# Patient Record
Sex: Female | Born: 1950 | Race: White | Hispanic: No
Health system: Midwestern US, Academic
[De-identification: ages and names within clinical notes are randomized; demographics above are authoritative.]

## PROBLEM LIST (undated history)

## (undated) DIAGNOSIS — E039 Hypothyroidism, unspecified: Secondary | ICD-10-CM

## (undated) DIAGNOSIS — D649 Anemia, unspecified: Secondary | ICD-10-CM

## (undated) DIAGNOSIS — Z8262 Family history of osteoporosis: Secondary | ICD-10-CM

## (undated) DIAGNOSIS — H269 Unspecified cataract: Secondary | ICD-10-CM

## (undated) DIAGNOSIS — M1990 Unspecified osteoarthritis, unspecified site: Secondary | ICD-10-CM

## (undated) DIAGNOSIS — R55 Syncope and collapse: Secondary | ICD-10-CM

## (undated) DIAGNOSIS — J309 Allergic rhinitis, unspecified: Secondary | ICD-10-CM

## (undated) DIAGNOSIS — Z9889 Other specified postprocedural states: Secondary | ICD-10-CM

## (undated) DIAGNOSIS — G5600 Carpal tunnel syndrome, unspecified upper limb: Secondary | ICD-10-CM

## (undated) DIAGNOSIS — R0982 Postnasal drip: Secondary | ICD-10-CM

## (undated) DIAGNOSIS — N3090 Cystitis, unspecified without hematuria: Secondary | ICD-10-CM

## (undated) DIAGNOSIS — C801 Malignant (primary) neoplasm, unspecified: Secondary | ICD-10-CM

## (undated) DIAGNOSIS — R112 Nausea with vomiting, unspecified: Secondary | ICD-10-CM

## (undated) HISTORY — DX: Allergic rhinitis, unspecified: J30.9

## (undated) HISTORY — PX: DIAGNOSTIC LAPAROSCOPY: SUR761

## (undated) HISTORY — DX: Family history of osteoporosis: Z82.62

## (undated) HISTORY — PX: EYE SURGERY: SHX253

## (undated) HISTORY — PX: CARPAL TUNNEL RELEASE: SHX101

## (undated) HISTORY — PX: BONE MARROW BIOPSY: SHX199

## (undated) HISTORY — PX: BACK SURGERY: SHX140

## (undated) HISTORY — DX: Cystitis, unspecified without hematuria: N30.90

## (undated) HISTORY — DX: Postnasal drip: R09.82

## (undated) HISTORY — DX: Anemia, unspecified: D64.9

## (undated) HISTORY — DX: Carpal tunnel syndrome, unspecified upper limb: G56.00

## (undated) HISTORY — DX: Syncope and collapse: R55

---
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
## 2000-05-19 ENCOUNTER — Other Ambulatory Visit: Admission: RE | Admit: 2000-05-19 | Discharge: 2000-05-19 | Payer: Self-pay | Admitting: Family Medicine

## 2002-08-09 ENCOUNTER — Ambulatory Visit (HOSPITAL_BASED_OUTPATIENT_CLINIC_OR_DEPARTMENT_OTHER): Admission: RE | Admit: 2002-08-09 | Discharge: 2002-08-09 | Payer: Self-pay | Admitting: Orthopedic Surgery

## 2003-02-04 ENCOUNTER — Other Ambulatory Visit: Admission: RE | Admit: 2003-02-04 | Discharge: 2003-02-04 | Payer: Self-pay | Admitting: Family Medicine

## 2003-09-28 HISTORY — PX: COLONOSCOPY: SHX174

## 2004-06-12 ENCOUNTER — Ambulatory Visit (HOSPITAL_COMMUNITY): Admission: RE | Admit: 2004-06-12 | Discharge: 2004-06-12 | Payer: Self-pay | Admitting: Gastroenterology

## 2004-12-10 ENCOUNTER — Ambulatory Visit: Payer: Self-pay | Admitting: Family Medicine

## 2005-02-05 ENCOUNTER — Ambulatory Visit: Payer: Self-pay | Admitting: Internal Medicine

## 2005-05-10 ENCOUNTER — Ambulatory Visit: Payer: Self-pay | Admitting: Family Medicine

## 2005-06-12 LAB — HM COLONOSCOPY: HM Colonoscopy: NORMAL

## 2006-04-13 ENCOUNTER — Other Ambulatory Visit: Admission: RE | Admit: 2006-04-13 | Discharge: 2006-04-13 | Payer: Self-pay | Admitting: Family Medicine

## 2006-04-13 ENCOUNTER — Ambulatory Visit: Payer: Self-pay | Admitting: Family Medicine

## 2006-04-13 ENCOUNTER — Encounter: Payer: Self-pay | Admitting: Family Medicine

## 2006-05-13 LAB — FECAL OCCULT BLOOD, GUAIAC: Fecal Occult Blood: NEGATIVE

## 2006-05-19 ENCOUNTER — Ambulatory Visit: Payer: Self-pay | Admitting: Family Medicine

## 2006-07-18 ENCOUNTER — Ambulatory Visit: Payer: Self-pay | Admitting: Ophthalmology

## 2007-11-16 ENCOUNTER — Ambulatory Visit: Payer: Self-pay | Admitting: Family Medicine

## 2008-05-21 ENCOUNTER — Telehealth: Payer: Self-pay | Admitting: Family Medicine

## 2008-06-26 ENCOUNTER — Encounter: Payer: Self-pay | Admitting: Family Medicine

## 2008-06-26 ENCOUNTER — Other Ambulatory Visit: Admission: RE | Admit: 2008-06-26 | Discharge: 2008-06-26 | Payer: Self-pay | Admitting: Family Medicine

## 2008-06-26 ENCOUNTER — Ambulatory Visit: Payer: Self-pay | Admitting: Family Medicine

## 2008-06-26 DIAGNOSIS — D649 Anemia, unspecified: Secondary | ICD-10-CM

## 2008-06-26 DIAGNOSIS — T148XXA Other injury of unspecified body region, initial encounter: Secondary | ICD-10-CM | POA: Insufficient documentation

## 2008-06-26 DIAGNOSIS — J309 Allergic rhinitis, unspecified: Secondary | ICD-10-CM | POA: Insufficient documentation

## 2008-06-26 DIAGNOSIS — R55 Syncope and collapse: Secondary | ICD-10-CM | POA: Insufficient documentation

## 2008-07-02 LAB — CONVERTED CEMR LAB
Albumin: 4.3 g/dL (ref 3.5–5.2)
BUN: 14 mg/dL (ref 6–23)
Calcium: 9.3 mg/dL (ref 8.4–10.5)
Cholesterol: 216 mg/dL (ref 0–200)
Direct LDL: 136.9 mg/dL
Eosinophils Relative: 1.9 % (ref 0.0–5.0)
GFR calc Af Amer: 95 mL/min
Glucose, Bld: 82 mg/dL (ref 70–99)
HCT: 39.3 % (ref 36.0–46.0)
Hemoglobin: 13.6 g/dL (ref 12.0–15.0)
MCV: 95.9 fL (ref 78.0–100.0)
Monocytes Absolute: 0.8 10*3/uL (ref 0.1–1.0)
Monocytes Relative: 9.3 % (ref 3.0–12.0)
Neutro Abs: 4.9 10*3/uL (ref 1.4–7.7)
RDW: 13 % (ref 11.5–14.6)
TSH: 2.64 microintl units/mL (ref 0.35–5.50)
Total CHOL/HDL Ratio: 3.4
Total Protein: 8 g/dL (ref 6.0–8.3)
WBC: 8.1 10*3/uL (ref 4.5–10.5)

## 2008-07-16 ENCOUNTER — Encounter: Payer: Self-pay | Admitting: Family Medicine

## 2008-10-28 LAB — HM DEXA SCAN: HM Dexa Scan: NORMAL

## 2008-10-31 ENCOUNTER — Encounter: Payer: Self-pay | Admitting: Family Medicine

## 2008-11-05 ENCOUNTER — Encounter (INDEPENDENT_AMBULATORY_CARE_PROVIDER_SITE_OTHER): Payer: Self-pay | Admitting: *Deleted

## 2009-07-15 ENCOUNTER — Ambulatory Visit: Payer: Self-pay | Admitting: Family Medicine

## 2009-07-22 ENCOUNTER — Telehealth: Payer: Self-pay | Admitting: Family Medicine

## 2009-07-22 ENCOUNTER — Ambulatory Visit: Payer: Self-pay | Admitting: Family Medicine

## 2009-07-22 DIAGNOSIS — N3090 Cystitis, unspecified without hematuria: Secondary | ICD-10-CM | POA: Insufficient documentation

## 2009-07-22 LAB — CONVERTED CEMR LAB
Bilirubin Urine: NEGATIVE
Casts: 0 /lpf
Specific Gravity, Urine: >=1.03
Urobilinogen, UA: 0.2
Yeast, UA: 0
pH: 6

## 2009-07-23 ENCOUNTER — Encounter: Payer: Self-pay | Admitting: Family Medicine

## 2009-09-16 ENCOUNTER — Ambulatory Visit: Payer: Self-pay | Admitting: Family Medicine

## 2009-09-16 DIAGNOSIS — R0982 Postnasal drip: Secondary | ICD-10-CM | POA: Insufficient documentation

## 2009-09-22 LAB — CONVERTED CEMR LAB
BUN: 9 mg/dL (ref 6–23)
Basophils Absolute: 0.2 10*3/uL — ABNORMAL HIGH (ref 0.0–0.1)
Direct LDL: 123 mg/dL
Eosinophils Absolute: 0.2 10*3/uL (ref 0.0–0.7)
GFR calc non Af Amer: 68.04 mL/min (ref >60)
Glucose, Bld: 66 mg/dL — ABNORMAL LOW (ref 70–99)
HCT: 38.2 % (ref 36.0–46.0)
HDL: 71.6 mg/dL (ref >39.00)
Lymphs Abs: 2.3 10*3/uL (ref 0.7–4.0)
MCV: 97.5 fL (ref 78.0–100.0)
Monocytes Absolute: 0.5 10*3/uL (ref 0.1–1.0)
Monocytes Relative: 5.6 % (ref 3.0–12.0)
Platelets: 236 10*3/uL (ref 150.0–400.0)
Potassium: 4.5 meq/L (ref 3.5–5.1)
RDW: 12.8 % (ref 11.5–14.6)
TSH: 2.45 microintl units/mL (ref 0.35–5.50)
Total Bilirubin: 0.7 mg/dL (ref 0.3–1.2)

## 2009-10-16 ENCOUNTER — Encounter: Payer: Self-pay | Admitting: Family Medicine

## 2009-12-05 ENCOUNTER — Ambulatory Visit: Payer: Self-pay | Admitting: Family Medicine

## 2009-12-05 DIAGNOSIS — J209 Acute bronchitis, unspecified: Secondary | ICD-10-CM | POA: Insufficient documentation

## 2010-10-29 NOTE — Assessment & Plan Note (Signed)
Summary: 3:15 CONGESTION/DLO   Vital Signs:  Patient profile:   60 year old female Height:      64.5 inches Weight:      165.50 pounds BMI:     28.07 Temp:     98.4 degrees F oral Pulse rate:   88 / minute Pulse rhythm:   regular BP sitting:   112 / 70  (left arm) Cuff size:   regular  Vitals Entered By: Delilah Shan CMA Duncan Dull) (December 05, 2009 3:32 PM) CC: Congestion   History of Present Illness: 60 yo with 1 1/2 weeks of runny nose, productive cough. Felt a little feverish last night. No short of breath, no chest pain. Ears popping a little, no sinus pressure. Taking Mucinex which helps a little with symptoms.  Current Medications (verified): 1)  Calcium 1200 Mg .... Take By Mouth Daily As Directed 2)  Vitamin D .... Take By Mouth Daily As Directed 3)  Fish Oil   Oil (Fish Oil) .... Take 1 or 2 By Mouth Once Daily 4)  Azithromycin 250 Mg  Tabs (Azithromycin) .... 2 By  Mouth Today and Then 1 Daily For 4 Days 5)  Tussionex Pennkinetic Er 8-10 Mg/9ml Lqcr (Chlorpheniramine-Hydrocodone) .Marland Kitchen.. 1 Tsp Every 12 Hours As Needed Cough  Allergies: 1)  ! Allegra  Review of Systems      See HPI General:  Complains of fever; denies chills. ENT:  Complains of earache, nasal congestion, and sore throat; denies ear discharge and sinus pressure. Resp:  Complains of cough and sputum productive; denies shortness of breath and wheezing.  Physical Exam  General:  Well-developed,well-nourished,in no acute distress; alert,appropriate and cooperative throughout examination Ears:  TMs retracted bilaterally. Nose:  mucosal erythema.   Mouth:  pharynx pink and moist.   Lungs:  Normal respiratory effort, chest expands symmetrically.No crackles, scattered exp wheezes. Heart:  Normal rate and regular rhythm. S1 and S2 normal without gallop, murmur, click, rub or other extra sounds. Extremities:  no edema Psych:  normally interactive and good eye contact.     Impression &  Recommendations:  Problem # 1:  ACUTE BRONCHITIS (ICD-466.0) Assessment New Given duration of symptoms, will great with Zpack. Continue supportive care.  See pt instructions for details. Her updated medication list for this problem includes:    Azithromycin 250 Mg Tabs (Azithromycin) .Marland Kitchen... 2 by  mouth today and then 1 daily for 4 days    Tussionex Pennkinetic Er 8-10 Mg/23ml Lqcr (Chlorpheniramine-hydrocodone) .Marland Kitchen... 1 tsp every 12 hours as needed cough  Complete Medication List: 1)  Calcium 1200 Mg  .... Take by mouth daily as directed 2)  Vitamin D  .... Take by mouth daily as directed 3)  Fish Oil Oil (Fish oil) .... Take 1 or 2 by mouth once daily 4)  Azithromycin 250 Mg Tabs (Azithromycin) .... 2 by  mouth today and then 1 daily for 4 days 5)  Tussionex Pennkinetic Er 8-10 Mg/68ml Lqcr (Chlorpheniramine-hydrocodone) .Marland Kitchen.. 1 tsp every 12 hours as needed cough  Patient Instructions: 1)  Take antibiotic as directed.  Drink lots of fluids.  Treat sympotmatically with Mucinex, nasal saline irrigation, and Tylenol/Ibuprofen. Also try claritin D or zyrtec D over the counter- two times a day as needed ( have to sign for them at pharmacy). You can use warm compresses.  Cough suppressant at night. Call if not improving as expected in 5-7 days.  Prescriptions: TUSSIONEX PENNKINETIC ER 8-10 MG/5ML LQCR (CHLORPHENIRAMINE-HYDROCODONE) 1 tsp every 12 hours as needed cough  #  4 ounces x 0   Entered and Authorized by:   Ruthe Mannan MD   Signed by:   Ruthe Mannan MD on 12/05/2009   Method used:   Print then Give to Patient   RxID:   [id number] AZITHROMYCIN 250 MG  TABS (AZITHROMYCIN) 2 by  mouth today and then 1 daily for 4 days  #6 x 0   Entered and Authorized by:   Ruthe Mannan MD   Signed by:   Ruthe Mannan MD on 12/05/2009   Method used:   Electronically to        [facility]. [address] [phone]* (retail)       [address] [place], [place]  [id number]       Ph: [phone]       Fax:  [phone]   RxID:   [id number]   Current Allergies (reviewed today): ! ALLEGRA

## 2011-02-12 NOTE — Op Note (Signed)
   [name], Carla [name]                               ACCOUNT NO.:  [id number]   MEDICAL RECORD NO.:  [id number]                   PATIENT TYPE:  AMB   LOCATION:  DSC                                  FACILITY:  [facility]   PHYSICIAN:  Cindee Salt, M.D.                    DATE OF BIRTH:  1951-06-11   DATE OF PROCEDURE:  08/09/2002  DATE OF DISCHARGE:                                 OPERATIVE REPORT   PREOPERATIVE DIAGNOSIS:  Carpal tunnel syndrome, right hand.   POSTOPERATIVE DIAGNOSIS:  Carpal tunnel syndrome, right hand.   OPERATION:  Decompression of right median nerve.   SURGEON:  Cindee Salt, M.D.   ASSISTANT:  Alfredo Bach, R.N.   ANESTHESIA:  Forearm-based IV regional.   HISTORY:  The patient is a 60 year old female with a history of carpal  tunnel syndrome -- EMG and nerve conductions positive -- which has not  responded to conservative treatment.   DESCRIPTION OF PROCEDURE:  The patient was brought to the operating room  where a forearm-based IV regional anesthetic was carried out without  difficulty.  She was prepped and draped using Betadine scrubbing solution  with the right arm free, in a supine position.  A longitudinal incision was  made in the palm and carried down through subcutaneous tissue; bleeders were  electrocauterized.  Palmar fascia was split, superficial palmar arch  identified, the flexor tendons to the ring and little fingers identified.  To the ulnar side of the median nerve, the carpal retinaculum was incised  with sharp dissection.  A right-angle and Sewall retractor were placed  between skin and forearm fascia.  The fascia was released for approximately  3 cm proximal to the wrist crease under direct vision.  Canal was explored  and no further lesions were identified.  The wound was irrigated.  The skin  was closed with interrupted 5-0 nylon sutures.  Sterile compressive dressing  and splint were applied.  The patient tolerated the procedure well and was  taken to the recovery room for observation in satisfactory condition.   She is discharged home to return to the [facility] [place] in one  week on Vicodin and Keflex.                                               Cindee Salt, M.D.    GK/MEDQ  D:  08/09/2002  T:  08/09/2002  Job:  [id number]

## 2011-02-12 NOTE — Op Note (Signed)
NAMESHARAH, FINNELL                               ACCOUNT NO.:  [id number]   MEDICAL RECORD NO.:  [id number]                   PATIENT TYPE:  AMB   LOCATION:  ENDO                                 FACILITY:  [facility]   PHYSICIAN:  Bernette Redbird, M.D.                DATE OF BIRTH:  [birth year]   DATE OF PROCEDURE:  06/12/2004  DATE OF DISCHARGE:                                 OPERATIVE REPORT   PROCEDURE:  Colonoscopy.   INDICATION:  Screening in a standard-risk individual without prior screening  procedures.  No worrisome symptoms, no risk factors for colon cancer.   ENDOSCOPIST:  Bernette Redbird, M.D.   FINDINGS:  Normal exam to the terminal ileum.   DESCRIPTION OF PROCEDURE:  The nature, purpose, and risks of the procedure  have been discussed with the patient who provided written consent.  Sedation  was Phenergan 12.5 mg IV to reduce chance of nausea, fentanyl 40 mcg, and  Versed 4 mg IV without arrhythmias or desaturation.  The Olympus adjustable-  tension pediatric videocolonoscope was advanced with moderate difficulty  through a fixated, angulated sigmoid region, and then fairly easily around  the colon to the terminal ileum which had normal appearance, and pullback  was then performed.  The quality of the prep was very good, and it was felt  that all areas were well seen.   We did have to turn the patient into the supine position to facilitate  getting through the sigmoid region.   This was a normal examination.  No polyps, cancer, colitis, vascular  malformations, or diverticular disease were appreciated.  Retroflexion in  the rectum and re-inspection of the rectum were unremarkable.  No biopsies  were obtained.  The patient tolerated the procedure well, and there were no  apparent complications.   IMPRESSION:  1.  Normal screening colonoscopy (V76.51).  2.  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   PLAN:  Flexible sigmoidoscopy in five years for continued  screening.      RB/MEDQ  D:  06/12/2004  T:  06/13/2004  Job:  [id number]   cc:   Marne A. Milinda Antis, M.D. [facility]

## 2011-06-15 ENCOUNTER — Other Ambulatory Visit (INDEPENDENT_AMBULATORY_CARE_PROVIDER_SITE_OTHER): Payer: Self-pay

## 2011-06-15 ENCOUNTER — Other Ambulatory Visit: Payer: Self-pay

## 2011-06-15 ENCOUNTER — Telehealth (INDEPENDENT_AMBULATORY_CARE_PROVIDER_SITE_OTHER): Payer: Self-pay | Admitting: Family Medicine

## 2011-06-15 DIAGNOSIS — Z0000 Encounter for general adult medical examination without abnormal findings: Secondary | ICD-10-CM

## 2011-06-15 DIAGNOSIS — D649 Anemia, unspecified: Secondary | ICD-10-CM

## 2011-06-15 LAB — CBC WITH DIFFERENTIAL/PLATELET
Basophils Absolute: 0 10*3/uL (ref 0.0–0.1)
Basophils Relative: 0.6 % (ref 0.0–3.0)
Eosinophils Absolute: 0.1 10*3/uL (ref 0.0–0.7)
Lymphocytes Relative: 26.3 % (ref 12.0–46.0)
MCHC: 33.7 g/dL (ref 30.0–36.0)
Neutrophils Relative %: 64.7 % (ref 43.0–77.0)
RBC: 3.94 Mil/uL (ref 3.87–5.11)

## 2011-06-15 LAB — COMPREHENSIVE METABOLIC PANEL
ALT: 19 U/L (ref 0–35)
AST: 22 U/L (ref 0–37)
Albumin: 4.1 g/dL (ref 3.5–5.2)
Calcium: 9.4 mg/dL (ref 8.4–10.5)
Chloride: 103 mEq/L (ref 96–112)
Potassium: 4.2 mEq/L (ref 3.5–5.1)

## 2011-06-15 LAB — LIPID PANEL
LDL Cholesterol: 109 mg/dL — ABNORMAL HIGH (ref 0–99)
Total CHOL/HDL Ratio: 3

## 2011-06-15 NOTE — Telephone Encounter (Signed)
Message copied by Judy Pimple on Tue Jun 15, 2011 12:18 PM ------      Message from: Baldomero Lamy      Created: Tue Jun 15, 2011  7:50 AM      Regarding: Cpx labs today       Please order  future cpx labs for pt's upcomming lab appt.      Thanks      Rodney Booze

## 2011-06-21 ENCOUNTER — Encounter: Payer: Self-pay | Admitting: Family Medicine

## 2011-06-22 ENCOUNTER — Encounter: Payer: Self-pay | Admitting: Family Medicine

## 2011-06-22 ENCOUNTER — Ambulatory Visit (INDEPENDENT_AMBULATORY_CARE_PROVIDER_SITE_OTHER): Payer: BC Managed Care – PPO | Admitting: Family Medicine

## 2011-06-22 ENCOUNTER — Other Ambulatory Visit (HOSPITAL_COMMUNITY)
Admission: RE | Admit: 2011-06-22 | Discharge: 2011-06-22 | Disposition: A | Discharge status: Home / Self Care | Payer: BC Managed Care – PPO | Source: Ambulatory Visit | Attending: Family Medicine | Admitting: Family Medicine

## 2011-06-22 VITALS — BP 106/68 | HR 72 | Temp 98.2°F | Ht 64.5 in | Wt 164.2 lb

## 2011-06-22 DIAGNOSIS — Z01419 Encounter for gynecological examination (general) (routine) without abnormal findings: Secondary | ICD-10-CM | POA: Insufficient documentation

## 2011-06-22 DIAGNOSIS — Z0000 Encounter for general adult medical examination without abnormal findings: Secondary | ICD-10-CM

## 2011-06-22 DIAGNOSIS — Z1159 Encounter for screening for other viral diseases: Secondary | ICD-10-CM | POA: Insufficient documentation

## 2011-06-22 DIAGNOSIS — Z23 Encounter for immunization: Secondary | ICD-10-CM

## 2011-06-22 NOTE — Patient Instructions (Signed)
Cholesterol looks better  Keep working on low sat fat diet (Avoid red meat/ fried foods/ egg yolks/ fatty breakfast meats/ butter, cheese and high fat dairy/ and shellfish )  Make sure to schedule your mammogram Tdap today If you are interested in shingles vaccine in future - call your insurance company to see how coverage is and call us to schedule in 1 month or more  Try to get 1200-1500 mg of calcium per day with at least 1000 iu of vitamin D - for bone health  Exercise at least 5 days per week for 30 minutes

## 2011-06-22 NOTE — Progress Notes (Signed)
Subjective:    Patient ID: Carla [name], female    DOB: [birth year], 60 y.o.   MRN: [id number]  HPI Here for annual health mt exam and to rev chronic medical problems Is overall doing well  Had shingles this summer - was tx by her dermatologist    Wt is down 1 lb bmi is 27  Lipids good with LDL 109 Lab Results  Component Value Date   CHOL 193 06/15/2011   CHOL 208* 09/16/2009   CHOL 216* 06/26/2008   Lab Results  Component Value Date   HDL 70.30 06/15/2011   HDL 40.98 09/16/2009   HDL 62.7 06/26/2008   Lab Results  Component Value Date   LDLCALC 109* 06/15/2011   Lab Results  Component Value Date   TRIG 67.0 06/15/2011   TRIG 74.0 09/16/2009   TRIG 94 06/26/2008   Lab Results  Component Value Date   CHOLHDL 3 06/15/2011   CHOLHDL 3 09/16/2009   CHOLHDL 3.4 CALC 06/26/2008   Lab Results  Component Value Date   LDLDIRECT 123.0 09/16/2009   LDLDIRECT 136.9 06/26/2008    Diet is better - tries to watch out for fats   Pap- due for  No new partners and no abn paps   Td- ? Last Td- she will check her records at home  Is open Tdap - would like the pertussis coverage   Flu shot - just got    Zoster status- had shingles this summer  Has high end deductible insurance - ? If would be able to afford the vaccine     colonosc 9/06 normal - no problems - 10 y recall No change in stools   2/10 dexa nl  Ca and D- is not taking regularly  Has fam hx of OP  Mam was 1/11- is due for yearly mammogram  She needs to schedule that herself  Self exam no lumps   Patient Active Problem List  Diagnoses  . ANEMIA-NOS  . ALLERGIC RHINITIS  . CYSTITIS  . SYNCOPE  . POSTNASAL DRIP  . Routine general medical examination at a health care facility  . Gynecological examination   Past Medical History  Diagnosis Date  . Allergic rhinitis, cause unspecified   . Anemia, unspecified   . Cystitis, unspecified   . Family history of osteoporosis   . Postnasal drip   . Syncope and  collapse   . Carpal tunnel syndrome    Past Surgical History  Procedure Date  . Carpal tunnel release   . Colonoscopy 2005   History  Substance Use Topics  . Smoking status: Never Smoker   . Smokeless tobacco: Not on file  . Alcohol Use: Yes     Rare   Family History  Problem Relation Age of Onset  . Osteoporosis Mother   . Stroke Mother   . Coronary artery disease Father   . Stroke Father 9  . Diabetes      Aunts and uncles  . Breast cancer Maternal Aunt   . Breast cancer Paternal Aunt    Allergies  Allergen Reactions  . Fexofenadine     REACTION: does not work   Current Outpatient Prescriptions on File Prior to Visit  Medication Sig Dispense Refill  . Calcium Carbonate-Vit D-Min (CALCIUM 1200 PO) Take 1 tablet by mouth daily.        . fish oil-omega-3 fatty acids 1000 MG capsule Take 2 g by mouth daily.        Marland Kitchen VITAMIN D,  CHOLECALCIFEROL, PO Take by mouth daily.              Review of Systems Review of Systems  Constitutional: Negative for fever, appetite change, fatigue and unexpected weight change.  Eyes: Negative for pain and visual disturbance.  Respiratory: Negative for cough and shortness of breath.   Cardiovascular: Negative for cp or palpitations    Gastrointestinal: Negative for nausea, diarrhea and constipation.  Genitourinary: Negative for urgency and frequency.  Skin: Negative for pallor or rash   Neurological: Negative for weakness, light-headedness, numbness and headaches.  Hematological: Negative for adenopathy. Does not bruise/bleed easily.  Psychiatric/Behavioral: Negative for dysphoric mood. The patient is not nervous/anxious.          Objective:   Physical Exam  Constitutional: She appears well-developed and well-nourished. No distress.  HENT:  Head: Normocephalic and atraumatic.  Mouth/Throat: Oropharynx is clear and moist.  Eyes: Conjunctivae and EOM are normal. Pupils are equal, round, and reactive to light. No scleral icterus.    Neck: Normal range of motion. Neck supple. No JVD present. Carotid bruit is not present. No thyromegaly present.  Cardiovascular: Normal rate, regular rhythm, normal heart sounds and intact distal pulses.   Pulmonary/Chest: Effort normal and breath sounds normal. No respiratory distress. She has no wheezes.  Abdominal: Soft. Bowel sounds are normal. She exhibits no distension, no abdominal bruit and no mass. There is no tenderness.  Genitourinary: Vagina normal and uterus normal. No breast swelling, tenderness, discharge or bleeding. No vaginal discharge found.  Musculoskeletal: Normal range of motion. She exhibits no edema and no tenderness.  Lymphadenopathy:    She has no cervical adenopathy.  Neurological: She is alert. She has normal reflexes. No cranial nerve deficit. Coordination normal.  Skin: Skin is warm and dry. No rash noted. No erythema. No pallor.  Psychiatric: She has a normal mood and affect.          Assessment & Plan:

## 2011-06-22 NOTE — Assessment & Plan Note (Signed)
Gyn exam with pap today No hx of abn paps or problems  Pap sent  Nl exam

## 2011-06-22 NOTE — Assessment & Plan Note (Signed)
Reviewed health habits including diet and exercise and skin cancer prevention Also reviewed health mt list, fam hx and immunizations  Improved cholesterol this year- rev low sat fat diet  rec ca and D for OP prev rec exercise 5 d per week  Gyn exam done Rev wellness lab in detail with pt  Tdap today Flu shot today Pt will look into zostavax

## 2011-07-02 ENCOUNTER — Encounter: Payer: Self-pay | Admitting: *Deleted

## 2011-07-14 ENCOUNTER — Encounter: Payer: Self-pay | Admitting: Family Medicine

## 2011-07-20 ENCOUNTER — Encounter: Payer: Self-pay | Admitting: *Deleted

## 2012-05-25 ENCOUNTER — Encounter: Payer: Self-pay | Admitting: Family Medicine

## 2012-05-25 ENCOUNTER — Telehealth: Payer: Self-pay | Admitting: Family Medicine

## 2012-05-25 ENCOUNTER — Ambulatory Visit (INDEPENDENT_AMBULATORY_CARE_PROVIDER_SITE_OTHER): Payer: BC Managed Care – PPO | Admitting: Family Medicine

## 2012-05-25 VITALS — BP 108/64 | HR 60 | Temp 97.8°F | Wt 162.0 lb

## 2012-05-25 DIAGNOSIS — N760 Acute vaginitis: Secondary | ICD-10-CM

## 2012-05-25 MED ORDER — FLUCONAZOLE 150 MG PO TABS: 150.0000 mg | ORAL_TABLET | Freq: Once | ORAL | Status: AC

## 2012-05-25 MED ORDER — TERCONAZOLE 0.8 % VA CREA: 1.0000 | TOPICAL_CREAM | Freq: Every day | VAGINAL | Status: AC

## 2012-05-25 NOTE — Patient Instructions (Addendum)
Great to see you. You do have a yeast infection.  You can use the terazol or take diflucan orally. I'm not sure which will be cheaper out of pocket- please check with pharmacist.

## 2012-05-25 NOTE — Progress Notes (Signed)
SUBJECTIVE:  61 y.o. female complains of vulvular redness, itching and irritation for past 2-3 weeks.  Had hemorrhoids and felt symptoms started shortly after she used stronger soaps.  No discharge.  OTC monistat and acquafor have helped tremendously with symptoms but still having some irritation.  Denies abnormal vaginal bleeding or significant pelvic pain or fever. No UTI symptoms. Denies history of known exposure to STD.  No LMP recorded. Patient is postmenopausal.  Patient Active Problem List  Diagnosis  . ANEMIA-NOS  . ALLERGIC RHINITIS  . CYSTITIS  . SYNCOPE  . POSTNASAL DRIP  . Routine general medical examination at a health care facility  . Gynecological examination  . Vaginitis and vulvovaginitis   Past Medical History  Diagnosis Date  . Allergic rhinitis, cause unspecified   . Anemia, unspecified   . Cystitis, unspecified   . Family history of osteoporosis   . Postnasal drip   . Syncope and collapse   . Carpal tunnel syndrome    Past Surgical History  Procedure Date  . Carpal tunnel release   . Colonoscopy 2005   History  Substance Use Topics  . Smoking status: Never Smoker   . Smokeless tobacco: Not on file  . Alcohol Use: Yes     Rare   Family History  Problem Relation Age of Onset  . Osteoporosis Mother   . Stroke Mother   . Coronary artery disease Father   . Stroke Father 86  . Diabetes      Aunts and uncles  . Breast cancer Maternal Aunt   . Breast cancer Paternal Aunt    Allergies  Allergen Reactions  . Fexofenadine     REACTION: does not work   Current Outpatient Prescriptions on File Prior to Visit  Medication Sig Dispense Refill  . Calcium Carbonate-Vit D-Min (CALCIUM 1200 PO) Take 1 tablet by mouth daily.        . fish oil-omega-3 fatty acids 1000 MG capsule Take 2 g by mouth daily.        Marland Kitchen VITAMIN D, CHOLECALCIFEROL, PO Take by mouth daily.         The PMH, PSH, Social History, Family History, Medications, and allergies have  been reviewed in [facility], and have been updated if relevant.  OBJECTIVE:  BP 108/64  Pulse 60  Temp 97.8 F (36.6 C)  Wt 162 lb (73.483 kg)  She appears well, afebrile. Abdomen: benign, soft, nontender, no masses. Pelvic Exam: mild erythema of vulva, ulva, no abnormal odor or discharge WET MOUNT done - results: mobiluncus noted.   ASSESSMENT:  monilia vaginitis  PLAN:   Treatment: Terazol cream ROV prn if symptoms persist or worsen.

## 2012-05-25 NOTE — Telephone Encounter (Signed)
Triage Record Num: [id number] Operator: Laren Boom Patient Name: Carla [name] Call Date & Time: 05/24/2012 5:09:57PM Patient Phone: [phone] PCP: Audrie Gallus. [name] Patient Gender: Female PCP Fax : Patient DOB: [birth year] Practice Name: [facility] Reason for Call: Caller: Ermalinda/Patient; PCP: Roxy Manns [facility]); Best Callback Phone Number: [phone]; Past Menopause. 05/24/12 - Started having vaginal itching and burning symptoms about 3 weeks ago and use 3 day treatment of Monistat which helped but then it flared back up again. Started using 7 day Monistat on 05/10/12 and when she finished her whole vaginal area to rectum became red, dry and painful. Has been treating it with Aquafore which is relieveing the symptoms, but it's not clearing up. Afebrile. All Emergent Signs/Symptoms of Vaginal Discharge or Irritation Protocol ruled out except "genital itching, burning or redness" (disposition - see in 24 hours). Home Care Advice Given. Appointment Scheduled for 05/25/12 @ 9 am with Dr Ruthe Mannan. Protocol(s) Used: Vaginal Discharge or Irritation Recommended Outcome per Protocol: See Provider within 24 hours Reason for Outcome: Genital itching, burning or redness Care Advice: ~ Keep perineum clean and dry. ~ Call provider if symptoms worsen or new symptoms develop. ~ To help relieve itching/irritation, try a cool compress to vulva using a washcloth for 10-15 minutes. Refrain from douching, using scented deodorant tampons, or nonprescription medication until evaluated by provider. Do not use feminine hygiene sprays. Use condoms during sex. ~ 05/24/2012 5:39:03PM Page 1 of 1 [email or web address]

## 2012-07-05 ENCOUNTER — Encounter: Payer: Self-pay | Admitting: Family Medicine

## 2012-07-05 ENCOUNTER — Ambulatory Visit (INDEPENDENT_AMBULATORY_CARE_PROVIDER_SITE_OTHER): Payer: BC Managed Care – PPO | Admitting: Family Medicine

## 2012-07-05 VITALS — BP 118/74 | HR 68 | Temp 98.1°F | Ht 64.5 in | Wt 163.8 lb

## 2012-07-05 DIAGNOSIS — Z1231 Encounter for screening mammogram for malignant neoplasm of breast: Secondary | ICD-10-CM | POA: Insufficient documentation

## 2012-07-05 DIAGNOSIS — Z0000 Encounter for general adult medical examination without abnormal findings: Secondary | ICD-10-CM

## 2012-07-05 DIAGNOSIS — Z01419 Encounter for gynecological examination (general) (routine) without abnormal findings: Secondary | ICD-10-CM

## 2012-07-05 DIAGNOSIS — N760 Acute vaginitis: Secondary | ICD-10-CM

## 2012-07-05 DIAGNOSIS — Z2911 Encounter for prophylactic immunotherapy for respiratory syncytial virus (RSV): Secondary | ICD-10-CM

## 2012-07-05 DIAGNOSIS — L290 Pruritus ani: Secondary | ICD-10-CM | POA: Insufficient documentation

## 2012-07-05 LAB — COMPREHENSIVE METABOLIC PANEL
ALT: 19 U/L (ref 0–35)
AST: 23 U/L (ref 0–37)
Albumin: 3.9 g/dL (ref 3.5–5.2)
CO2: 26 mEq/L (ref 19–32)
Calcium: 9.3 mg/dL (ref 8.4–10.5)
Chloride: 105 mEq/L (ref 96–112)
Creatinine, Ser: 0.8 mg/dL (ref 0.4–1.2)
GFR: 75.05 mL/min (ref >60.00)
Potassium: 3.9 mEq/L (ref 3.5–5.1)
Sodium: 139 mEq/L (ref 135–145)
Total Protein: 7.9 g/dL (ref 6.0–8.3)

## 2012-07-05 LAB — CBC WITH DIFFERENTIAL/PLATELET
Basophils Absolute: 0.1 10*3/uL (ref 0.0–0.1)
Hemoglobin: 12.9 g/dL (ref 12.0–15.0)
Lymphocytes Relative: 24.6 % (ref 12.0–46.0)
Monocytes Relative: 7.7 % (ref 3.0–12.0)
Neutro Abs: 6.1 10*3/uL (ref 1.4–7.7)
Neutrophils Relative %: 65.6 % (ref 43.0–77.0)
RDW: 14.1 % (ref 11.5–14.6)

## 2012-07-05 LAB — LIPID PANEL
LDL Cholesterol: 116 mg/dL — ABNORMAL HIGH (ref 0–99)
Total CHOL/HDL Ratio: 3

## 2012-07-05 MED ORDER — FLUCONAZOLE 150 MG PO TABS: ORAL_TABLET | ORAL | Status: DC

## 2012-07-05 MED ORDER — KETOCONAZOLE 2 % EX CREA: TOPICAL_CREAM | Freq: Two times a day (BID) | CUTANEOUS | Status: DC

## 2012-07-05 NOTE — Assessment & Plan Note (Signed)
With redness and satellite lesions  Suspect yeast again  Will tx with diflucan times 3 over 9 days and also ketoconazole cream Will update if no imp  Wet prep vaginal was neg

## 2012-07-05 NOTE — Assessment & Plan Note (Signed)
Scheduled annual screening mammogram Nl breast exam today  Encouraged monthly self exams   

## 2012-07-05 NOTE — Assessment & Plan Note (Signed)
Done without pap Nl exam - except for skin erythema- rectal utd pap

## 2012-07-05 NOTE — Assessment & Plan Note (Signed)
Nl wet prep today- suspect vaginal yeast is resolved

## 2012-07-05 NOTE — Patient Instructions (Addendum)
Zoster vaccine today  Make nurse appt for 1 month from now for flu shot  Labs today We will refer you for a mammogram at check out  Use the cream on rectal and outer vaginal area until improved  Take the diflucan as directed  If not improved in 2 weeks let me know please

## 2012-07-05 NOTE — Progress Notes (Signed)
Subjective:    Patient ID: Carla [name], female    DOB: [birth year], 61 y.o.   MRN: [id number]  HPI Here for health maintenance exam and to review chronic medical problems    Feeling fair   Some new issues  In July- had a bad constipation bout and then painful hemorrhoids , then ended up with yeast infection  Used monistat/ ointments and creams  Took diflucan  Now occasional vaginal itching Some redness and itching of rectal area - some bumps too  No known cold sores or herpes at all   Pap was nl in 9/12   Wt is up 1 lb with bmi of 27  Flu shot - will get in a month   Zoster - had outbreak of shingles  Wants to do shingles shot today   Mammogram 10/12  Needs to set that up - at [facility]  No lumps on self exam   colonosc 9/06 -- 10 year recall  Nl dexa 2/10   Patient Active Problem List  Diagnosis  . ANEMIA-NOS  . ALLERGIC RHINITIS  . CYSTITIS  . SYNCOPE  . POSTNASAL DRIP  . Routine general medical examination at a health care facility  . Gynecological examination  . Vaginitis and vulvovaginitis  . Other screening mammogram   Past Medical History  Diagnosis Date  . Allergic rhinitis, cause unspecified   . Anemia, unspecified   . Cystitis, unspecified   . Family history of osteoporosis   . Postnasal drip   . Syncope and collapse   . Carpal tunnel syndrome    Past Surgical History  Procedure Date  . Carpal tunnel release   . Colonoscopy 2005   History  Substance Use Topics  . Smoking status: Never Smoker   . Smokeless tobacco: Not on file  . Alcohol Use: Yes     Rare   Family History  Problem Relation Age of Onset  . Osteoporosis Mother   . Stroke Mother   . Coronary artery disease Father   . Stroke Father 67  . Diabetes      Aunts and uncles  . Breast cancer Maternal Aunt   . Breast cancer Paternal Aunt    Allergies  Allergen Reactions  . Fexofenadine     REACTION: does not work   Current Outpatient Prescriptions on File Prior to Visit    Medication Sig Dispense Refill  . Calcium Carbonate-Vit D-Min (CALCIUM 1200 PO) Take 1 tablet by mouth daily.        . fexofenadine (ALLEGRA) 180 MG tablet Take 180 mg by mouth daily.      . fish oil-omega-3 fatty acids 1000 MG capsule Take 2 g by mouth daily.            Review of Systems Review of Systems  Constitutional: Negative for fever, appetite change, fatigue and unexpected weight change.  Eyes: Negative for pain and visual disturbance.  Respiratory: Negative for cough and shortness of breath.   Cardiovascular: Negative for cp or palpitations    Gastrointestinal: Negative for nausea, diarrhea and constipation.  Genitourinary: Negative for urgency and frequency.  Skin: Negative for pallor or rash  pos for rectal itching  Neurological: Negative for weakness, light-headedness, numbness and headaches.  Hematological: Negative for adenopathy. Does not bruise/bleed easily.  Psychiatric/Behavioral: Negative for dysphoric mood. The patient is not nervous/anxious.         Objective:   Physical Exam  Constitutional: She appears well-developed and well-nourished.  HENT:  Head: Normocephalic and atraumatic.  Right Ear: External ear normal.  Left Ear: External ear normal.  Nose: Nose normal.  Mouth/Throat: Oropharynx is clear and moist.  Eyes: Conjunctivae normal and EOM are normal. Pupils are equal, round, and reactive to light. Right eye exhibits no discharge. No scleral icterus.  Neck: Normal range of motion. Neck supple. No JVD present. Carotid bruit is not present. No thyromegaly present.  Cardiovascular: Normal rate, regular rhythm, normal heart sounds and intact distal pulses.  Exam reveals no gallop.   Pulmonary/Chest: Effort normal and breath sounds normal. No respiratory distress. She has no wheezes.  Abdominal: Soft. Bowel sounds are normal. She exhibits no distension, no abdominal bruit and no mass. There is no tenderness.       No suprapubic tenderness or fullness     Genitourinary: Vagina normal. Rectal exam shows external hemorrhoid. Rectal exam shows no fissure, no mass and no tenderness. No breast swelling, tenderness, discharge or bleeding. There is no rash, tenderness or lesion on the right labia. There is no rash, tenderness or lesion on the left labia. Uterus is not tender. Cervix exhibits no motion tenderness, no discharge and no friability. Right adnexum displays no mass, no tenderness and no fullness. Left adnexum displays no mass, no tenderness and no fullness. No vaginal discharge found.       Breast exam: No mass, nodules, thickening, tenderness, bulging, retraction, inflamation, nipple discharge or skin changes noted.  No axillary or clavicular LA.  Chaperoned exam.    Erythema of skin surrounding rectum with no breakdown and some satellite lesions  Musculoskeletal: She exhibits no edema and no tenderness.  Lymphadenopathy:    She has no cervical adenopathy.  Neurological: She is alert. She has normal reflexes. No cranial nerve deficit. She exhibits normal muscle tone. Coordination normal.  Skin: Skin is warm and dry. Rash noted. There is erythema. No pallor.       Around rectum  Psychiatric: She has a normal mood and affect.          Assessment & Plan:

## 2012-07-05 NOTE — Assessment & Plan Note (Signed)
Reviewed health habits including diet and exercise and skin cancer prevention Also reviewed health mt list, fam hx and immunizations   Lab today Zoster vaccine today  Will return for flu shot in a month

## 2012-07-06 ENCOUNTER — Encounter: Payer: Self-pay | Admitting: *Deleted

## 2012-07-06 LAB — POCT WET PREP (WET MOUNT): KOH Wet Prep POC: NEGATIVE

## 2012-07-21 ENCOUNTER — Telehealth: Payer: Self-pay | Admitting: Family Medicine

## 2012-07-21 MED ORDER — TERCONAZOLE 0.8 % VA CREA: TOPICAL_CREAM | VAGINAL | Status: DC

## 2012-07-21 NOTE — Telephone Encounter (Signed)
Pt said she has both rectal and vaginal itching

## 2012-07-21 NOTE — Telephone Encounter (Signed)
Pt notified and Rx was called in as prescribed

## 2012-07-21 NOTE — Telephone Encounter (Signed)
Patient got better with itching and irritation from yeast infection in the past 2 weeks but it is back as of today.  She wants to know what she can take now? Please call the patient.

## 2012-07-21 NOTE — Telephone Encounter (Signed)
She has been on both diflucan and cream -- it she still having both rectal and vaginal itching or just one of those?

## 2012-07-21 NOTE — Telephone Encounter (Signed)
I'm going to try her on terazol- a different topical med for both- use on itchy areas If no further improvement let me know and I will do a specialist ref

## 2012-08-01 ENCOUNTER — Ambulatory Visit
Admission: RE | Admit: 2012-08-01 | Discharge: 2012-08-01 | Disposition: A | Discharge status: Home / Self Care | Payer: BC Managed Care – PPO | Source: Ambulatory Visit | Attending: Family Medicine | Admitting: Family Medicine

## 2012-08-01 DIAGNOSIS — Z1231 Encounter for screening mammogram for malignant neoplasm of breast: Secondary | ICD-10-CM

## 2012-08-03 ENCOUNTER — Encounter: Payer: Self-pay | Admitting: *Deleted

## 2012-09-04 ENCOUNTER — Ambulatory Visit (INDEPENDENT_AMBULATORY_CARE_PROVIDER_SITE_OTHER): Payer: BC Managed Care – PPO | Admitting: Family Medicine

## 2012-09-04 ENCOUNTER — Encounter: Payer: Self-pay | Admitting: Family Medicine

## 2012-09-04 VITALS — BP 140/80 | HR 80 | Temp 98.0°F | Wt 167.0 lb

## 2012-09-04 DIAGNOSIS — J069 Acute upper respiratory infection, unspecified: Secondary | ICD-10-CM | POA: Insufficient documentation

## 2012-09-04 MED ORDER — HYDROCOD POLST-CHLORPHEN POLST 10-8 MG/5ML PO LQCR: 5.0000 mL | Freq: Two times a day (BID) | ORAL | Status: DC | PRN

## 2012-09-04 NOTE — Patient Instructions (Addendum)
Good to see you. Continue Mucinex, Sudafed.  Use Tussionex as needed when not driving.  Call us in a few days if no better.

## 2012-09-04 NOTE — Progress Notes (Signed)
SUBJECTIVE:  Carla [name] is a 61 y.o. female who complains of coryza, congestion, sneezing, productive cough and headache for 10 days. She denies a history of anorexia, chest pain, nausea, shortness of breath, sweats and vomiting and denies a history of asthma. Patient denies smoke cigarettes.    Patient Active Problem List  Diagnosis  . ANEMIA-NOS  . ALLERGIC RHINITIS  . CYSTITIS  . SYNCOPE  . POSTNASAL DRIP  . Routine general medical examination at a health care facility  . Routine gynecological examination  . Vaginitis and vulvovaginitis  . Other screening mammogram  . Anal itching  . URI (upper respiratory infection)   Past Medical History  Diagnosis Date  . Allergic rhinitis, cause unspecified   . Anemia, unspecified   . Cystitis, unspecified   . Family history of osteoporosis   . Postnasal drip   . Syncope and collapse   . Carpal tunnel syndrome    Past Surgical History  Procedure Date  . Carpal tunnel release   . Colonoscopy 2005   History  Substance Use Topics  . Smoking status: Never Smoker   . Smokeless tobacco: Not on file  . Alcohol Use: Yes     Comment: Rare   Family History  Problem Relation Age of Onset  . Osteoporosis Mother   . Stroke Mother   . Coronary artery disease Father   . Stroke Father 20  . Diabetes      Aunts and uncles  . Breast cancer Maternal Aunt   . Breast cancer Paternal Aunt    Allergies  Allergen Reactions  . Fexofenadine     REACTION: does not work   Current Outpatient Prescriptions on File Prior to Visit  Medication Sig Dispense Refill  . Calcium Carbonate-Vit D-Min (CALCIUM 1200 PO) Take 1 tablet by mouth daily.        . fexofenadine (ALLEGRA) 180 MG tablet Take 180 mg by mouth daily.      . fish oil-omega-3 fatty acids 1000 MG capsule Take 2 g by mouth daily.        . fluconazole (DIFLUCAN) 150 MG tablet Take 1 pill every 3 days by mouth until done  3 tablet  0  . ketoconazole (NIZORAL) 2 % cream Apply topically 2  (two) times daily. To affected area  15 g  1  . terconazole (TERAZOL 3) 0.8 % vaginal cream Apply to affected areas once daily  20 g  0   The PMH, PSH, Social History, Family History, Medications, and allergies have been reviewed in [facility], and have been updated if relevant.  OBJECTIVE: BP 140/80  Pulse 80  Temp 98 F (36.7 C)  Wt 167 lb (75.751 kg)  She appears well, vital signs are as noted. Ears normal.  Throat and pharynx normal.  Neck supple. No adenopathy in the neck. Nose is congested. Sinuses non tender. The chest is clear, without wheezes or rales. Harsh cough in office  ASSESSMENT:  viral upper respiratory illness  PLAN: Symptomatic therapy suggested: Tussionex, push fluids, rest and return office visit prn if symptoms persist or worsen. Lack of antibiotic effectiveness discussed with her. Call or return to clinic prn if these symptoms worsen or fail to improve as anticipated.

## 2012-09-11 ENCOUNTER — Telehealth: Payer: Self-pay | Admitting: Family Medicine

## 2012-09-11 MED ORDER — AZITHROMYCIN 250 MG PO TABS: ORAL_TABLET | ORAL | Status: DC

## 2012-09-11 NOTE — Telephone Encounter (Signed)
Will route to Dr Dayton Martes so she is aware

## 2012-09-11 NOTE — Telephone Encounter (Signed)
Can call in zpack  Px written for call in  For increased cough  The eye needs to be checked, however - make appt with PCP for that this week - and go to ER or UC if suddenly worse or blurry vision

## 2012-09-11 NOTE — Telephone Encounter (Signed)
Rx called in as prescribed Pt notified Rx was called and pt wanted to wait until she had the Z-pack in her system a few days to see if her eye gets any better. Pt declined appt but said will call for appt or if sxs worsen I advise her to go to ER or UC

## 2012-09-11 NOTE — Telephone Encounter (Signed)
Patient Information:  Caller Name: Ameah  Phone: [phone]  Patient: Kenton, Alonna P  Gender: Female  DOB: [birth year]  Age: 61 Years  PCP: Roxy Manns [facility])  Office Follow Up:  Does the office need to follow up with this patient?: Yes  Instructions For The Office: Needs antibiotic called in.   Symptoms  Reason For Call & Symptoms: Pt was sen by Dr. Dayton Martes  09/04/12 and diagnosed with a virus and cough is getting worse.  Also having  yellow/green discharge from right eye.   This started 09/09/12 PM.  Dr. Dayton Martes told pt if she did not feel better to just call for an antibiotic and she would call one in.  ALSO PT IS NOT ALLERGIC TO FEXOFINIDINE, SHE TAKES IT.  Reviewed Health History In EMR: Yes  Reviewed Medications In EMR: Yes  Reviewed Allergies In EMR: Yes  Reviewed Surgeries / Procedures: No  Date of Onset of Symptoms: 08/28/2012  Treatments Tried: Tussionex,  and Fexofenidine  Treatments Tried Worked: No  Guideline(s) Used:  Cough  Disposition Per Guideline:   See Within 3 Days in Office  Reason For Disposition Reached:   Cough has been present for > 10 days  Advice Given:  Reassurance  Coughing is the way that our lungs remove irritants and mucus. It helps protect our lungs from getting pneumonia.  Cough Medicines:  Home Remedy - Hard Candy: Hard candy works just as well as medicine-flavored OTC cough drops. Diabetics should use sugar-free candy.  RN Overrode Recommendation:  Patient Requests Prescription  Pt was instructed that if she did not fell better Dr. Dayton Martes would call in an antibiotic for her.

## 2013-07-18 ENCOUNTER — Encounter: Payer: Self-pay | Admitting: Family Medicine

## 2013-07-18 ENCOUNTER — Ambulatory Visit (INDEPENDENT_AMBULATORY_CARE_PROVIDER_SITE_OTHER): Payer: BC Managed Care – PPO | Admitting: Family Medicine

## 2013-07-18 VITALS — BP 104/66 | HR 74 | Temp 97.9°F | Ht 64.09 in | Wt 166.2 lb

## 2013-07-18 DIAGNOSIS — Z0000 Encounter for general adult medical examination without abnormal findings: Secondary | ICD-10-CM

## 2013-07-18 DIAGNOSIS — Z23 Encounter for immunization: Secondary | ICD-10-CM

## 2013-07-18 DIAGNOSIS — Z1289 Encounter for screening for malignant neoplasm of other sites: Secondary | ICD-10-CM

## 2013-07-18 NOTE — Progress Notes (Signed)
Subjective:    Patient ID: Carla [name], female    DOB: [birth year], 62 y.o.   MRN: [id number]  HPI Here for health maintenance exam and to review chronic medical problems   Has been keeping grandkids lately- is tired by the end of the day  Doing very well overall   Flu vaccine today  Mammogram 11/13- [facility]  Self exam-no lumps at all   Pap 9/12 nl  Gyn hx -no problems at all (got over her yeast infection)  colonosc 9/06 nl - 10 year recall , no family hx of colon cancer   Td 9/12  Zoster vaccine 10/13  Nl dexa 2/10 She is not good about ca and D  No falls or fractues   Needs labs  Wt stable with bmi of 28 Not exercising like she should - she walks some when she can  Int[place] to [place] the aquatic center to swim   Patient Active Problem List   Diagnosis Date Noted  . URI (upper respiratory infection) 09/04/2012  . Other screening mammogram 07/05/2012  . Anal itching 07/05/2012  . Vaginitis and vulvovaginitis 05/25/2012  . Routine gynecological examination 06/22/2011  . Routine general medical examination at a health care facility 06/15/2011  . POSTNASAL DRIP 09/16/2009  . CYSTITIS 07/22/2009  . ANEMIA-NOS 06/26/2008  . ALLERGIC RHINITIS 06/26/2008  . SYNCOPE 06/26/2008   Past Medical History  Diagnosis Date  . Allergic rhinitis, cause unspecified   . Anemia, unspecified   . Cystitis, unspecified   . Family history of osteoporosis   . Postnasal drip   . Syncope and collapse   . Carpal tunnel syndrome    Past Surgical History  Procedure Laterality Date  . Carpal tunnel release    . Colonoscopy  2005   History  Substance Use Topics  . Smoking status: Never Smoker   . Smokeless tobacco: Never Used  . Alcohol Use: Yes     Comment: Rare   Family History  Problem Relation Age of Onset  . Osteoporosis Mother   . Stroke Mother   . Coronary artery disease Father   . Stroke Father 80  . Diabetes      Aunts and uncles  . Breast cancer  Maternal Aunt   . Breast cancer Paternal Aunt    Allergies  Allergen Reactions  . Fexofenadine     REACTION: does not work   Current Outpatient Prescriptions on File Prior to Visit  Medication Sig Dispense Refill  . Calcium Carbonate-Vit D-Min (CALCIUM 1200 PO) Take 1 tablet by mouth daily.        . fish oil-omega-3 fatty acids 1000 MG capsule Take 2 g by mouth daily.         No current facility-administered medications on file prior to visit.    Review of Systems Review of Systems  Constitutional: Negative for fever, appetite change, fatigue and unexpected weight change.  Eyes: Negative for pain and visual disturbance.  Respiratory: Negative for cough and shortness of breath.   Cardiovascular: Negative for cp or palpitations    Gastrointestinal: Negative for nausea, diarrhea and constipation.  Genitourinary: Negative for urgency and frequency.  Skin: Negative for pallor or rash   Neurological: Negative for weakness, light-headedness, numbness and headaches.  Hematological: Negative for adenopathy. Does not bruise/bleed easily.  Psychiatric/Behavioral: Negative for dysphoric mood. The patient is not nervous/anxious.         Objective:   Physical Exam  Constitutional: She appears well-developed and  well-nourished. No distress.  overwt and well app  HENT:  Head: Normocephalic and atraumatic.  Right Ear: External ear normal.  Left Ear: External ear normal.  Nose: Nose normal.  Mouth/Throat: Oropharynx is clear and moist.  Eyes: Conjunctivae and EOM are normal. Pupils are equal, round, and reactive to light. Right eye exhibits no discharge. Left eye exhibits no discharge. No scleral icterus.  Neck: Normal range of motion. Neck supple. No JVD present. No thyromegaly present.  Cardiovascular: Normal rate, regular rhythm, normal heart sounds and intact distal pulses.  Exam reveals no gallop.   Pulmonary/Chest: Effort normal and breath sounds normal. No respiratory distress. She  has no wheezes. She has no rales.  Abdominal: Soft. Bowel sounds are normal. She exhibits no distension and no mass. There is no tenderness.  Genitourinary: No breast swelling, tenderness, discharge or bleeding.  Breast exam: No mass, nodules, thickening, tenderness, bulging, retraction, inflamation, nipple discharge or skin changes noted.  No axillary or clavicular LA.   Musculoskeletal: She exhibits no edema and no tenderness.  Lymphadenopathy:    She has no cervical adenopathy.  Neurological: She is alert. She has normal reflexes. No cranial nerve deficit. She exhibits normal muscle tone. Coordination normal.  Skin: Skin is warm and dry. No rash noted. No erythema. No pallor.  Psychiatric: She has a normal mood and affect.          Assessment & Plan:

## 2013-07-18 NOTE — Patient Instructions (Signed)
Don't forget to schedule your annual mammogram  Eat a small serving of yogurt daily (for probiotics to prevent yeast infections) Try to get 1200-1500 mg of calcium per day with at least 1000 iu of vitamin D - for bone health  Work hard to get some exercise - goal is 30 minutes 5 days per week  Also eat a healthy diet  Labs today

## 2013-07-19 LAB — CBC WITH DIFFERENTIAL/PLATELET
Basophils Relative: 2.1 % (ref 0.0–3.0)
Eosinophils Relative: 2.4 % (ref 0.0–5.0)
HCT: 34.5 % — ABNORMAL LOW (ref 36.0–46.0)
Hemoglobin: 11.7 g/dL — ABNORMAL LOW (ref 12.0–15.0)
Lymphocytes Relative: 29.1 % (ref 12.0–46.0)
Lymphs Abs: 2.7 10*3/uL (ref 0.7–4.0)
MCV: 94 fl (ref 78.0–100.0)
Monocytes Relative: 10.2 % (ref 3.0–12.0)
Neutro Abs: 5.1 10*3/uL (ref 1.4–7.7)
RBC: 3.67 Mil/uL — ABNORMAL LOW (ref 3.87–5.11)
RDW: 14.1 % (ref 11.5–14.6)

## 2013-07-19 LAB — COMPREHENSIVE METABOLIC PANEL
BUN: 15 mg/dL (ref 6–23)
CO2: 27 mEq/L (ref 19–32)
Calcium: 9.1 mg/dL (ref 8.4–10.5)
Chloride: 103 mEq/L (ref 96–112)
Creatinine, Ser: 0.8 mg/dL (ref 0.4–1.2)
GFR: 73.76 mL/min (ref >60.00)
Total Bilirubin: 0.3 mg/dL (ref 0.3–1.2)

## 2013-07-19 LAB — LIPID PANEL
Cholesterol: 175 mg/dL (ref 0–200)
Total CHOL/HDL Ratio: 3
Triglycerides: 116 mg/dL (ref 0.0–149.0)

## 2013-07-19 NOTE — Assessment & Plan Note (Addendum)
Reviewed health habits including diet and exercise and skin cancer prevention Also reviewed health mt list, fam hx and immunizations  drew wellness labs today

## 2013-07-24 NOTE — Addendum Note (Signed)
Addended by: Alvina Chou on: 07/24/2013 04:21 PM   Modules accepted: Orders

## 2013-07-24 NOTE — Addendum Note (Signed)
Addended by: Shon Millet on: 07/24/2013 03:50 PM   Modules accepted: Orders

## 2013-08-17 ENCOUNTER — Other Ambulatory Visit: Payer: Self-pay

## 2013-08-17 DIAGNOSIS — Z1231 Encounter for screening mammogram for malignant neoplasm of breast: Secondary | ICD-10-CM

## 2013-08-22 ENCOUNTER — Other Ambulatory Visit (INDEPENDENT_AMBULATORY_CARE_PROVIDER_SITE_OTHER): Payer: BC Managed Care – PPO

## 2013-08-22 DIAGNOSIS — Z1289 Encounter for screening for malignant neoplasm of other sites: Secondary | ICD-10-CM

## 2013-08-24 ENCOUNTER — Encounter: Payer: Self-pay | Admitting: *Deleted

## 2013-09-03 ENCOUNTER — Encounter: Payer: BC Managed Care – PPO | Admitting: Family Medicine

## 2013-09-14 ENCOUNTER — Ambulatory Visit
Admission: RE | Admit: 2013-09-14 | Discharge: 2013-09-14 | Disposition: A | Discharge status: Home / Self Care | Payer: BC Managed Care – PPO | Source: Ambulatory Visit

## 2013-09-14 DIAGNOSIS — Z1231 Encounter for screening mammogram for malignant neoplasm of breast: Secondary | ICD-10-CM

## 2013-10-16 ENCOUNTER — Telehealth: Payer: Self-pay | Admitting: Family Medicine

## 2013-10-16 NOTE — Telephone Encounter (Signed)
Patient Information:  Caller Name: Carla [name]  Phone: [phone]  Patient: Carla [name]  Gender: Female  DOB: 1951/09/03  Age: 63 Years  PCP: [name], [facility], [facility])  Office Follow Up:  Does the office need to follow up with this patient?: No  Instructions For The Office: N/A  RN Note:  Appt scheduled for 10/17/13 at 10:15 with Dr. Glori Bickers. Home care advice and call back parameters reviewed. Understanding expressed  Symptoms  Reason For Call & Symptoms: Patient states illness on Saturday 10/13/13 with fever of 105.0.  She states her head was pounding and treated herself aleve and fluids.  Fever came down but remained around 101.0 (o) on Sunday 10/14/13.  Yesterday, 10/15/13,  some better but had sore throat  and temperature broke last night , Today, sore throat is less today , glands are swollen . Ears are itchy.  Afebrile and no headache. Feels better today.  No cold symptoms.  Reviewed Health History In EMR: Yes  Reviewed Medications In EMR: Yes  Reviewed Allergies In EMR: Yes  Reviewed Surgeries / Procedures: Yes  Date of Onset of Symptoms: 10/13/2013  Treatments Tried: Aleve, salt water gargles  Treatments Tried Worked: Yes  Guideline(s) Used:  Sore Throat  Disposition Per Guideline:   Strep Test Only Visit Today or Tomorrow  Reason For Disposition Reached:   Patient requesting a strep throat test  Advice Given:  For Relief of Sore Throat Pain:  Sip warm chicken broth or apple juice.  Gargle warm salt water 3 times daily (1 teaspoon of salt in 8 oz or 240 ml of warm water).  Avoid cigarette smoke.  Pain Medicines:  For pain relief, you can take either acetaminophen, ibuprofen, or naproxen.  They are over-the-counter (OTC) pain drugs. You can buy them at the drugstore.  Soft Diet:   Cold drinks and milk shakes are especially good (Reason: swollen tonsils can make some foods hard to swallow).  Liquids:  Adequate liquid intake is important to prevent dehydration. Drink 6-8  glasses of water per day.  Call Back If:  Sore throat is the main symptom and it lasts longer than 24 hours  Sore throat is mild but lasts longer than 4 days  Fever lasts longer than 3 days  You become worse.  Patient Will Follow Care Advice:  YES  Appointment Scheduled:  10/17/2013 10:15:00 Appointment Scheduled Provider:  Loura Pardon [facility])

## 2013-10-16 NOTE — Telephone Encounter (Signed)
I will see her then  

## 2013-10-17 ENCOUNTER — Encounter: Payer: Self-pay | Admitting: Family Medicine

## 2013-10-17 ENCOUNTER — Ambulatory Visit (INDEPENDENT_AMBULATORY_CARE_PROVIDER_SITE_OTHER): Payer: BC Managed Care – PPO | Admitting: Family Medicine

## 2013-10-17 VITALS — BP 108/68 | HR 67 | Temp 98.1°F | Ht 64.09 in | Wt 164.5 lb

## 2013-10-17 DIAGNOSIS — J029 Acute pharyngitis, unspecified: Secondary | ICD-10-CM

## 2013-10-17 LAB — POCT RAPID STREP A (OFFICE): Rapid Strep A Screen: NEGATIVE

## 2013-10-17 MED ORDER — AMOXICILLIN 500 MG PO CAPS: 500.0000 mg | ORAL_CAPSULE | Freq: Three times a day (TID) | ORAL | Status: DC

## 2013-10-17 NOTE — Patient Instructions (Signed)
Drink lots of fluids Salt water gargle is helpful  Tylenol or ibuprofen or aleve for pain or fever  Update me if new symptoms  We will call when throat culture returns - in the meantime take the amoxicillin as directed

## 2013-10-17 NOTE — Progress Notes (Signed)
Subjective:    Patient ID: Carla [name], female    DOB: October 29, 1950, 63 y.o.   MRN: [id number]  HPI Here with ST   Sat evening - she started feeling bad/ legs ached/ fever of 100.1 -- went up to 104.9  Bad headache  Aleve helped both  Next day - temp 100-101 St started Monday  tues - fever stopped  Thought she had a virus   Today feels a bit better -no fever or aches Throat is still really sore Glands are sore  Ears feel pressure  No nasal symptoms No cough    Patient Active Problem List   Diagnosis Date Noted  . Acute pharyngitis 10/17/2013  . Other screening mammogram 07/05/2012  . Routine gynecological examination 06/22/2011  . Routine general medical examination at a health care facility 06/15/2011  . ALLERGIC RHINITIS 06/26/2008   Past Medical History  Diagnosis Date  . Allergic rhinitis, cause unspecified   . Anemia, unspecified   . Cystitis, unspecified   . Family history of osteoporosis   . Postnasal drip   . Syncope and collapse   . Carpal tunnel syndrome    Past Surgical History  Procedure Laterality Date  . Carpal tunnel release    . Colonoscopy  2005   History  Substance Use Topics  . Smoking status: Never Smoker   . Smokeless tobacco: Never Used  . Alcohol Use: Yes     Comment: Rare   Family History  Problem Relation Age of Onset  . Osteoporosis Mother   . Stroke Mother   . Coronary artery disease Father   . Stroke Father 39  . Diabetes      Aunts and uncles  . Breast cancer Maternal Aunt   . Breast cancer Paternal Aunt    Allergies  Allergen Reactions  . Codeine Nausea Only  . Fexofenadine     REACTION: does not work   Current Outpatient Prescriptions on File Prior to Visit  Medication Sig Dispense Refill  . Calcium Carbonate-Vit D-Min (CALCIUM 1200 PO) Take 1 tablet by mouth daily.        . cetirizine (ZYRTEC) 10 MG tablet Take 10 mg by mouth daily.      . fish oil-omega-3 fatty acids 1000 MG capsule Take 2 g by mouth daily.          No current facility-administered medications on file prior to visit.      Review of Systems Review of Systems  Constitutional: pos for fever and malaise ENT pos for ST/ neg for cong or sinus pain  Eyes: Negative for pain and visual disturbance.  Respiratory: Negative for cough and shortness of breath.   Cardiovascular: Negative for cp or palpitations    Gastrointestinal: Negative for nausea, diarrhea and constipation.  Genitourinary: Negative for urgency and frequency.  Skin: Negative for pallor or rash   Neurological: Negative for weakness, light-headedness, numbness and headaches.  Hematological: Negative for adenopathy. Does not bruise/bleed easily.  Psychiatric/Behavioral: Negative for dysphoric mood. The patient is not nervous/anxious.         Objective:   Physical Exam  Constitutional: She appears well-developed and well-nourished. No distress.  HENT:  Head: Normocephalic and atraumatic.  Right Ear: External ear normal.  Left Ear: External ear normal.  Nose: Nose normal.  Post throat injection without swelling or exudate   Eyes: Conjunctivae and EOM are normal. Pupils are equal, round, and reactive to light. Right eye exhibits no discharge. Left eye exhibits no discharge.  Neck:  Normal range of motion. Neck supple.  Some anterior cervical adenopathy bilat   Pulmonary/Chest: Effort normal and breath sounds normal. No respiratory distress. She has no wheezes.  Lymphadenopathy:    She has cervical adenopathy.  Neurological: She is alert.  Skin: Skin is warm and dry. No rash noted. No erythema. No pallor.  Psychiatric: She has a normal mood and affect.          Assessment & Plan:

## 2013-10-17 NOTE — Progress Notes (Signed)
Pre-visit discussion using our clinic review tool. No additional management support is needed unless otherwise documented below in the visit note.  

## 2013-10-18 NOTE — Assessment & Plan Note (Signed)
RST neg but symptoms consistent with strep Throat cx sent  tx with amoxicillin  Update if not starting to improve in a week or if worsening  Disc symptomatic care - see instructions on AVS

## 2013-10-19 LAB — CULTURE, GROUP A STREP: Organism ID, Bacteria: NORMAL

## 2014-07-09 ENCOUNTER — Ambulatory Visit: Payer: BC Managed Care – PPO

## 2014-10-08 ENCOUNTER — Other Ambulatory Visit (HOSPITAL_COMMUNITY)
Admission: RE | Admit: 2014-10-08 | Discharge: 2014-10-08 | Disposition: A | Discharge status: Home / Self Care | Payer: BLUE CROSS/BLUE SHIELD | Source: Ambulatory Visit | Attending: Family Medicine | Admitting: Family Medicine

## 2014-10-08 ENCOUNTER — Ambulatory Visit (INDEPENDENT_AMBULATORY_CARE_PROVIDER_SITE_OTHER): Payer: BLUE CROSS/BLUE SHIELD | Admitting: Family Medicine

## 2014-10-08 ENCOUNTER — Encounter: Payer: Self-pay | Admitting: Family Medicine

## 2014-10-08 VITALS — BP 108/68 | HR 70 | Temp 98.1°F | Resp 18 | Ht 64.24 in | Wt 163.8 lb

## 2014-10-08 DIAGNOSIS — Z01419 Encounter for gynecological examination (general) (routine) without abnormal findings: Secondary | ICD-10-CM | POA: Diagnosis present

## 2014-10-08 DIAGNOSIS — Z0000 Encounter for general adult medical examination without abnormal findings: Secondary | ICD-10-CM

## 2014-10-08 DIAGNOSIS — Z1151 Encounter for screening for human papillomavirus (HPV): Secondary | ICD-10-CM | POA: Insufficient documentation

## 2014-10-08 LAB — CBC WITH DIFFERENTIAL/PLATELET
BASOS ABS: 0 10*3/uL (ref 0.0–0.1)
Basophils Relative: 0.5 % (ref 0.0–3.0)
EOS PCT: 1.8 % (ref 0.0–5.0)
Eosinophils Absolute: 0.2 10*3/uL (ref 0.0–0.7)
HCT: 37.3 % (ref 36.0–46.0)
Hemoglobin: 12.3 g/dL (ref 12.0–15.0)
Lymphocytes Relative: 29.2 % (ref 12.0–46.0)
Lymphs Abs: 2.6 10*3/uL (ref 0.7–4.0)
MCHC: 33 g/dL (ref 30.0–36.0)
MCV: 96.4 fl (ref 78.0–100.0)
MONOS PCT: 8.6 % (ref 3.0–12.0)
Monocytes Absolute: 0.8 10*3/uL (ref 0.1–1.0)
Neutro Abs: 5.3 10*3/uL (ref 1.4–7.7)
Neutrophils Relative %: 59.9 % (ref 43.0–77.0)
PLATELETS: 273 10*3/uL (ref 150.0–400.0)
RBC: 3.87 Mil/uL (ref 3.87–5.11)
RDW: 14.8 % (ref 11.5–15.5)
WBC: 8.9 10*3/uL (ref 4.0–10.5)

## 2014-10-08 LAB — COMPREHENSIVE METABOLIC PANEL
ALBUMIN: 4.2 g/dL (ref 3.5–5.2)
ALK PHOS: 53 U/L (ref 39–117)
ALT: 19 U/L (ref 0–35)
AST: 20 U/L (ref 0–37)
BUN: 13 mg/dL (ref 6–23)
CO2: 28 mEq/L (ref 19–32)
Calcium: 9.2 mg/dL (ref 8.4–10.5)
Chloride: 105 mEq/L (ref 96–112)
Creatinine, Ser: 0.9 mg/dL (ref 0.4–1.2)
GFR: 69.59 mL/min (ref >60.00)
GLUCOSE: 91 mg/dL (ref 70–99)
POTASSIUM: 4.1 meq/L (ref 3.5–5.1)
SODIUM: 136 meq/L (ref 135–145)
TOTAL PROTEIN: 8.1 g/dL (ref 6.0–8.3)
Total Bilirubin: 0.7 mg/dL (ref 0.2–1.2)

## 2014-10-08 LAB — LIPID PANEL
CHOLESTEROL: 179 mg/dL (ref 0–200)
HDL: 57.5 mg/dL (ref >39.00)
LDL CALC: 103 mg/dL — AB (ref 0–99)
NonHDL: 121.5
TRIGLYCERIDES: 95 mg/dL (ref 0.0–149.0)
Total CHOL/HDL Ratio: 3
VLDL: 19 mg/dL (ref 0.0–40.0)

## 2014-10-08 LAB — TSH: TSH: 3.14 u[IU]/mL (ref 0.35–4.50)

## 2014-10-08 NOTE — Progress Notes (Signed)
Subjective:    Patient ID: Carla [name], female    DOB: 11/11/1950, 64 y.o.   MRN: [id number]  HPI Here for health maintenance exam and to review chronic medical problems   Doing ok overall   A lot of illness in family  A lot to deal with   Wt is stable with bmi of 38 Is taking care of herself - eating healthy Not exercising much  She still has full time care of grandchild - this is the last year - with that Will be able to exercise later    HIV screen - declines  Colon screen ifob 11/14 nl   colonosc 9/06- will be due for 10 year f/u in the fall  Nl  Mammogram 12/14 nl - has not scheduled  Self exam - no lumps or changes   Flu vaccine 10/15 Td 9/12  Zoster vaccine 10/13  Pap was 9/12  Normal  Due for 3 year one today  Has not had a hysterectomy   Needs labs   Patient Active Problem List   Diagnosis Date Noted  . Encounter for routine gynecological examination 10/08/2014  . Acute pharyngitis 10/17/2013  . Other screening mammogram 07/05/2012  . Routine gynecological examination 06/22/2011  . Routine general medical examination at a health care facility 06/15/2011  . ALLERGIC RHINITIS 06/26/2008   Past Medical History  Diagnosis Date  . Allergic rhinitis, cause unspecified   . Anemia, unspecified   . Cystitis, unspecified   . Family history of osteoporosis   . Postnasal drip   . Syncope and collapse   . Carpal tunnel syndrome    Past Surgical History  Procedure Laterality Date  . Carpal tunnel release    . Colonoscopy  2005   History  Substance Use Topics  . Smoking status: Never Smoker   . Smokeless tobacco: Never Used  . Alcohol Use: 0.0 oz/week    0 Not specified per week     Comment: Rare   Family History  Problem Relation Age of Onset  . Osteoporosis Mother   . Stroke Mother   . Coronary artery disease Father   . Stroke Father 27  . Diabetes      Aunts and uncles  . Breast cancer Maternal Aunt   . Breast cancer Paternal Aunt     Allergies  Allergen Reactions  . Codeine Nausea Only  . Fexofenadine     REACTION: does not work   Current Outpatient Prescriptions on File Prior to Visit  Medication Sig Dispense Refill  . Calcium Carbonate-Vit D-Min (CALCIUM 1200 PO) Take 1 tablet by mouth daily.      . cetirizine (ZYRTEC) 10 MG tablet Take 10 mg by mouth daily.    . fish oil-omega-3 fatty acids 1000 MG capsule Take 2 g by mouth daily.       No current facility-administered medications on file prior to visit.     Review of Systems Review of Systems  Constitutional: Negative for fever, appetite change, fatigue and unexpected weight change.  Eyes: Negative for pain and visual disturbance.  Respiratory: Negative for cough and shortness of breath.   Cardiovascular: Negative for cp or palpitations    Gastrointestinal: Negative for nausea, diarrhea and constipation.  Genitourinary: Negative for urgency and frequency.  Skin: Negative for pallor or rash   Neurological: Negative for weakness, light-headedness, numbness and headaches.  Hematological: Negative for adenopathy. Does not bruise/bleed easily.  Psychiatric/Behavioral: Negative for dysphoric mood. The patient is not nervous/anxious.  Objective:   Physical Exam  Constitutional: She appears well-developed and well-nourished. No distress.  HENT:  Head: Normocephalic and atraumatic.  Right Ear: External ear normal.  Left Ear: External ear normal.  Mouth/Throat: Oropharynx is clear and moist.  Eyes: Conjunctivae and EOM are normal. Pupils are equal, round, and reactive to light. No scleral icterus.  Neck: Normal range of motion. Neck supple. No JVD present. Carotid bruit is not present. No thyromegaly present.  Cardiovascular: Normal rate, regular rhythm, normal heart sounds and intact distal pulses.  Exam reveals no gallop.   Pulmonary/Chest: Effort normal and breath sounds normal. No respiratory distress. She has no wheezes. She exhibits no  tenderness.  Abdominal: Soft. Bowel sounds are normal. She exhibits no distension, no abdominal bruit and no mass. There is no tenderness.  Genitourinary: Vagina normal and uterus normal. No breast swelling, tenderness, discharge or bleeding. There is no rash, tenderness or lesion on the right labia. There is no rash, tenderness or lesion on the left labia. Uterus is not enlarged and not tender. Cervix exhibits no motion tenderness, no discharge and no friability. Right adnexum displays no mass, no tenderness and no fullness. Left adnexum displays no mass, no tenderness and no fullness. No bleeding in the vagina. No vaginal discharge found.  Breast exam: No mass, nodules, thickening, tenderness, bulging, retraction, inflamation, nipple discharge or skin changes noted.  No axillary or clavicular LA.      Musculoskeletal: Normal range of motion. She exhibits no edema or tenderness.  Lymphadenopathy:    She has no cervical adenopathy.  Neurological: She is alert. She has normal reflexes. No cranial nerve deficit. She exhibits normal muscle tone. Coordination normal.  Skin: Skin is warm and dry. No rash noted. No erythema. No pallor.  Psychiatric: She has a normal mood and affect.          Assessment & Plan:   Problem List Items Addressed This Visit      Other   Encounter for routine gynecological examination    Done today with pap No complaints or issues       Relevant Orders   Cytology - PAP (Completed)   Routine general medical examination at a health care facility - Primary    Reviewed health habits including diet and exercise and skin cancer prevention Reviewed appropriate screening tests for age  Also reviewed health mt list, fam hx and immunization status , as well as social and family history   See HPI Lab today  Gyn exam done  Due for colonosc in the fall- she will call to schedule that  She will schedule her own mammogram       Relevant Orders   CBC with Differential  (Completed)   Comprehensive metabolic panel (Completed)   TSH (Completed)   Lipid panel (Completed)

## 2014-10-08 NOTE — Assessment & Plan Note (Signed)
Done today with pap No complaints or issues

## 2014-10-08 NOTE — Patient Instructions (Addendum)
You are due for a colonoscopy in the fall  Don't forget to schedule your mammogram  Labs today

## 2014-10-08 NOTE — Progress Notes (Signed)
Pre visit review using our clinic review tool, if applicable. No additional management support is needed unless otherwise documented below in the visit note. 

## 2014-10-08 NOTE — Assessment & Plan Note (Addendum)
Reviewed health habits including diet and exercise and skin cancer prevention Reviewed appropriate screening tests for age  Also reviewed health mt list, fam hx and immunization status , as well as social and family history   See HPI Lab today  Gyn exam done  Due for colonosc in the fall- she will call to schedule that  She will schedule her own mammogram

## 2014-10-09 LAB — CYTOLOGY - PAP

## 2014-11-07 ENCOUNTER — Other Ambulatory Visit: Payer: Self-pay

## 2014-11-07 DIAGNOSIS — Z1231 Encounter for screening mammogram for malignant neoplasm of breast: Secondary | ICD-10-CM

## 2014-11-19 ENCOUNTER — Ambulatory Visit: Payer: BLUE CROSS/BLUE SHIELD

## 2014-12-25 ENCOUNTER — Ambulatory Visit
Admission: RE | Admit: 2014-12-25 | Discharge: 2014-12-25 | Disposition: A | Discharge status: Home / Self Care | Payer: BLUE CROSS/BLUE SHIELD | Source: Ambulatory Visit

## 2014-12-25 DIAGNOSIS — Z1231 Encounter for screening mammogram for malignant neoplasm of breast: Secondary | ICD-10-CM

## 2015-06-19 ENCOUNTER — Encounter (HOSPITAL_COMMUNITY): Payer: Self-pay | Admitting: *Deleted

## 2015-06-30 ENCOUNTER — Encounter (HOSPITAL_COMMUNITY): Payer: Self-pay | Admitting: Anesthesiology

## 2015-06-30 NOTE — Anesthesia Preprocedure Evaluation (Deleted)
Anesthesia Evaluation  Patient identified by MRN, date of birth, ID band Patient awake    Reviewed: Allergy & Precautions, NPO status , Patient's Chart, lab work & pertinent test results, reviewed documented beta blocker date and time   History of Anesthesia Complications (+) PONV  Airway        Dental   Pulmonary neg pulmonary ROS,           Cardiovascular negative cardio ROS       Neuro/Psych negative neurological ROS  negative psych ROS   GI/Hepatic negative GI ROS, Neg liver ROS,   Endo/Other  negative endocrine ROS  Renal/GU negative Renal ROS     Musculoskeletal   Abdominal   Peds  Hematology   Anesthesia Other Findings Screening colonoscopy  Reproductive/Obstetrics                             Anesthesia Physical Anesthesia Plan  ASA: II  Anesthesia Plan: MAC   Post-op Pain Management:    Induction: Intravenous  Airway Management Planned: Nasal Cannula  Additional Equipment:   Intra-op Plan:   Post-operative Plan:   Informed Consent:   Plan Discussed with:   Anesthesia Plan Comments:         Anesthesia Quick Evaluation

## 2015-07-03 ENCOUNTER — Ambulatory Visit (HOSPITAL_COMMUNITY): Admission: RE | Admit: 2015-07-03 | Payer: PPO | Source: Ambulatory Visit | Admitting: Gastroenterology

## 2015-07-03 HISTORY — DX: Nausea with vomiting, unspecified: R11.2

## 2015-07-03 HISTORY — DX: Other specified postprocedural states: Z98.890

## 2015-07-03 SURGERY — COLONOSCOPY WITH PROPOFOL
Anesthesia: Monitor Anesthesia Care

## 2015-07-03 MED ORDER — PROPOFOL 10 MG/ML IV BOLUS
INTRAVENOUS | Status: AC
  Filled 2015-07-03: qty 20

## 2015-07-03 MED ORDER — FENTANYL CITRATE (PF) 100 MCG/2ML IJ SOLN
INTRAMUSCULAR | Status: AC
  Filled 2015-07-03: qty 4

## 2015-07-03 MED ORDER — LIDOCAINE HCL (CARDIAC) 20 MG/ML IV SOLN
INTRAVENOUS | Status: AC
  Filled 2015-07-03: qty 5

## 2015-07-24 ENCOUNTER — Encounter (HOSPITAL_COMMUNITY): Payer: Self-pay | Admitting: *Deleted

## 2015-07-30 ENCOUNTER — Other Ambulatory Visit: Payer: Self-pay | Admitting: Gastroenterology

## 2015-07-30 LAB — HM COLONOSCOPY

## 2015-07-31 ENCOUNTER — Encounter (HOSPITAL_COMMUNITY)
Admission: RE | Disposition: A | Discharge status: Home / Self Care | Payer: Self-pay | Source: Ambulatory Visit | Attending: Gastroenterology

## 2015-07-31 ENCOUNTER — Ambulatory Visit (HOSPITAL_COMMUNITY)
Admission: RE | Admit: 2015-07-31 | Discharge: 2015-07-31 | Disposition: A | Discharge status: Home / Self Care | Payer: PPO | Source: Ambulatory Visit | Attending: Gastroenterology | Admitting: Gastroenterology

## 2015-07-31 ENCOUNTER — Ambulatory Visit (HOSPITAL_COMMUNITY): Payer: PPO | Admitting: Anesthesiology

## 2015-07-31 ENCOUNTER — Encounter (HOSPITAL_COMMUNITY): Payer: Self-pay | Admitting: *Deleted

## 2015-07-31 DIAGNOSIS — K5660 Unspecified intestinal obstruction: Secondary | ICD-10-CM | POA: Insufficient documentation

## 2015-07-31 DIAGNOSIS — K219 Gastro-esophageal reflux disease without esophagitis: Secondary | ICD-10-CM | POA: Diagnosis not present

## 2015-07-31 DIAGNOSIS — Z1211 Encounter for screening for malignant neoplasm of colon: Secondary | ICD-10-CM | POA: Diagnosis not present

## 2015-07-31 DIAGNOSIS — Z79899 Other long term (current) drug therapy: Secondary | ICD-10-CM | POA: Diagnosis not present

## 2015-07-31 HISTORY — PX: COLONOSCOPY WITH PROPOFOL: SHX5780

## 2015-07-31 SURGERY — COLONOSCOPY WITH PROPOFOL
Anesthesia: Monitor Anesthesia Care

## 2015-07-31 MED ORDER — PROPOFOL 10 MG/ML IV BOLUS
INTRAVENOUS | Status: AC
  Filled 2015-07-31: qty 20

## 2015-07-31 MED ORDER — DEXAMETHASONE SODIUM PHOSPHATE 4 MG/ML IJ SOLN
INTRAMUSCULAR | Status: DC | PRN
  Administered 2015-07-31: 10 mg via INTRAVENOUS

## 2015-07-31 MED ORDER — PROPOFOL 10 MG/ML IV BOLUS
INTRAVENOUS | Status: DC | PRN
  Administered 2015-07-31: 40 mg via INTRAVENOUS
  Administered 2015-07-31 (×3): 20 mg via INTRAVENOUS
  Administered 2015-07-31 (×4): 40 mg via INTRAVENOUS

## 2015-07-31 MED ORDER — SODIUM CHLORIDE 0.9 % IV SOLN: INTRAVENOUS | Status: DC

## 2015-07-31 MED ORDER — FENTANYL CITRATE (PF) 100 MCG/2ML IJ SOLN: 25.0000 ug | INTRAMUSCULAR | Status: DC | PRN

## 2015-07-31 MED ORDER — LACTATED RINGERS IV SOLN
INTRAVENOUS | Status: DC
Start: 2015-07-31 — End: 2015-07-31
  Administered 2015-07-31: 1000 mL via INTRAVENOUS

## 2015-07-31 MED ORDER — ONDANSETRON HCL 4 MG/2ML IJ SOLN
INTRAMUSCULAR | Status: DC | PRN
  Administered 2015-07-31: 4 mg via INTRAVENOUS

## 2015-07-31 MED ORDER — MIDAZOLAM HCL 2 MG/2ML IJ SOLN: 0.5000 mg | Freq: Once | INTRAMUSCULAR | Status: DC | PRN

## 2015-07-31 MED ORDER — MEPERIDINE HCL 100 MG/ML IJ SOLN: 6.2500 mg | INTRAMUSCULAR | Status: DC | PRN

## 2015-07-31 MED ORDER — SCOPOLAMINE 1 MG/3DAYS TD PT72
1.0000 | MEDICATED_PATCH | Freq: Once | TRANSDERMAL | Status: DC
  Administered 2015-07-31: 1.5 mg via TRANSDERMAL
  Filled 2015-07-31: qty 1

## 2015-07-31 MED ORDER — PROMETHAZINE HCL 25 MG/ML IJ SOLN: 6.2500 mg | INTRAMUSCULAR | Status: DC | PRN

## 2015-07-31 MED ORDER — LACTATED RINGERS IV SOLN
INTRAVENOUS | Status: DC | PRN
  Administered 2015-07-31: 10:00:00 via INTRAVENOUS

## 2015-07-31 SURGICAL SUPPLY — 22 items

## 2015-07-31 NOTE — Discharge Instructions (Signed)
Colonoscopy, Care After °These instructions give you information on caring for yourself after your procedure. Your doctor Perea also give you more specific instructions. Call your doctor if you have any problems or questions after your procedure. °HOME CARE °· Do not drive for 24 hours. °· Do not sign important papers or use machinery for 24 hours. °· You Stopper shower. °· You Neumeyer go back to your usual activities, but go slower for the first 24 hours. °· Take rest breaks often during the first 24 hours. °· Walk around or use warm packs on your belly (abdomen) if you have belly cramping or gas. °· Drink enough fluids to keep your pee (urine) clear or pale yellow. °· Resume your normal diet. Avoid heavy or fried foods. °· Avoid drinking alcohol for 24 hours or as told by your doctor. °· Only take medicines as told by your doctor. °If a tissue sample (biopsy) was taken during the procedure:  °· Do not take aspirin or blood thinners for 7 days, or as told by your doctor. °· Do not drink alcohol for 7 days, or as told by your doctor. °· Eat soft foods for the first 24 hours. °GET HELP IF: °You still have a small amount of blood in your poop (stool) 2-3 days after the procedure. °GET HELP RIGHT AWAY IF: °· You have more than a small amount of blood in your poop. °· You see clumps of tissue (blood clots) in your poop. °· Your belly is puffy (swollen). °· You feel sick to your stomach (nauseous) or throw up (vomit). °· You have a fever. °· You have belly pain that gets worse and medicine does not help. °MAKE SURE YOU: °· Understand these instructions. °· Will watch your condition. °· Will get help right away if you are not doing well or get worse. °  °This information is not intended to replace advice given to you by your health care provider. Make sure you discuss any questions you have with your health care provider. °  °Document Released: 10/16/2010 Document Revised: 09/18/2013 Document Reviewed: 05/21/2013 °Elsevier  Interactive Patient Education ©2016 [employer]. ° °

## 2015-07-31 NOTE — Transfer of Care (Signed)
Immediate Anesthesia Transfer of Care Note  Patient: Carla [name]  Procedure(s) Performed: Procedure(s): COLONOSCOPY WITH PROPOFOL (N/A)  Patient Location: PACU and Endoscopy Unit  Anesthesia Type:MAC  Level of Consciousness: awake, oriented, patient cooperative, lethargic and responds to stimulation  Airway & Oxygen Therapy: Patient Spontanous Breathing and Patient connected to face mask oxygen  Post-op Assessment: Report given to RN, Post -op Vital signs reviewed and stable and Patient moving all extremities  Post vital signs: Reviewed and stable  Last Vitals:  Filed Vitals:   07/31/15 0911  BP: 147/76  Pulse: 58  Temp: 36.4 C  Resp: 14    Complications: No apparent anesthesia complications

## 2015-07-31 NOTE — H&P (Signed)
Carla [name] is an 64 y.o. female.   Chief Complaint: Colon cancer screening HPI: This 64 year old female last underwent screening colonoscopy 10 years ago, at which time a significant amount of sigmoid fixation and diverticulosis were noted.   She does not have active lower GI tract symptoms, a previous attempt at screening colonoscopy about a month ago was unsuccessful because she was unable to tolerate the standard PEG lavage prep. Accordingly, this time she was treated with Dulcolax tablets and 3 doses of MiraLAX on each of 3 days prior to the procedure. She indicates that she tolerated this well and had good results.  Past Medical History  Diagnosis Date  . Allergic rhinitis, cause unspecified   . Anemia, unspecified   . Cystitis, unspecified   . Family history of osteoporosis   . Postnasal drip   . Syncope and collapse   . Carpal tunnel syndrome   . PONV (postoperative nausea and vomiting)     Past Surgical History  Procedure Laterality Date  . Carpal tunnel release    . Colonoscopy  2005  . Diagnostic laparoscopy      dx lack of pregnancy   . Eye surgery      lasik, cataract, prk,yag procedure    Family History  Problem Relation Age of Onset  . Osteoporosis Mother   . Stroke Mother   . Coronary artery disease Father   . Stroke Father 42  . Diabetes      Aunts and uncles  . Breast cancer Maternal Aunt   . Breast cancer Paternal Aunt    Social History:  reports that she has never smoked. She has never used smokeless tobacco. She reports that she drinks alcohol. She reports that she does not use illicit drugs.  Allergies:  Allergies  Allergen Reactions  . Codeine Nausea Only    Medications Prior to Admission  Medication Sig Dispense Refill  . cetirizine (ZYRTEC) 10 MG tablet Take 10 mg by mouth daily as needed for allergies.     Marland Kitchen GAVILYTE-N WITH FLAVOR PACK 420 G solution use as directed by prescriber  0  . metroNIDAZOLE (METROGEL) 1 % gel APPLY TO FACE AT  BEDTIME  0  . triamcinolone cream (KENALOG) 0.1 % APPLY TWICE A DAY TO AFFECTED AREAS AS NEEDED FOR RASH.  0    No results found for this or any previous visit (from the past 65 hour(s)). No results found.  ROS  see history of present illness  Blood pressure 147/76, pulse 58, temperature 97.6 F (36.4 C), temperature source Oral, resp. rate 14, height 5\' 4"  (1.626 m), weight 73.936 kg (163 lb).    Physical Exam a pleasant and healthy-appearing female in no evident distress. She does not appear anxious or depressed. She is anicteric and without pallor. Chest is clear. Heart is without murmur or arrhythmia. Abdomen is nondistended, and without guarding, mass effect, or tenderness.  Assessment/Plan Patient at "standard risk" for colon cancer, without worrisome symptoms, now due for updated screening (last colonoscopy was September, 2005).   Patient has known sigmoid diverticulosis and fixation, so we will plan to use the ultraslim colonoscope.  Ernie Sagrero V 07/31/2015, 10:05 AM

## 2015-07-31 NOTE — Op Note (Signed)
[facility] [place], [address]   COLONOSCOPY PROCEDURE REPORT  PATIENT: Carla [name], Carla [name]  MR#: [id number] BIRTHDATE: Carla [name] 22, 1952 , 64  yrs. old GENDER: female ENDOSCOPIST: Ronald Lobo, MD REFERRED BY:  Loura Pardon, MD PROCEDURE DATE:  08/14/2015 PROCEDURE:   colonoscopy ASA CLASS:   II INDICATIONS:  standard risk screening for colon cancer, last colonoscopy was in 2005 MEDICATIONS:  MAC per Anesthesia  DESCRIPTION OF PROCEDURE:   After the risks and benefits and of the procedure were explained, informed consent was obtained. The patient had come as an outpatient to the [facility].  Because of a prior history of severe intolerance to Trilyte(becoming sick after drinking just a couple of glasses), a modified prep was done, using a lax 2 tablets and MiraLAX 3 glasses each of 3 days prior to the procedure.. Because of a prior history of significant fixation and angulation in the sigmoid region on her exam 11 years ago, I elected to bring her to the hospital for use of the ultraslim colonoscope.  Digital exam was unremarkable.  The Pentax Slim Colonocsope [id number])  endoscope was introduced through the anus and advanced to the terminal ileum .  The quality of the prep was excellent      .  The instrument was then slowly withdrawn as the colon was fully examined. Estimated blood loss is zero unless otherwise noted in this procedure report.   This was an unremarkable examination.  As expected, there was quite a bit of angulation and fixation in the sigmoid region, making it slightly difficult to advance the scope through that area, but it was accomplished without significant difficulty.  The terminal ileum had a normal appearance and pullback was then performed. The quality of the prep was excellent and it's felt that all areas were well seen.  This was a normal examination. I did not see any polyps, masses, colitis, vascular  malformations, or diverticulosis, even in the sigmoid regionwhere there was quite a bit of fixation and I had previously observed diverticular change 11 years ago.  Retroflexion showed a normal distal rectum.          The scope was then withdrawn from the patient and the procedure completed. No biopsies were obtained.  WITHDRAWAL TIME: 11 minutes  COMPLICATIONS: There were no immediate complications.  ENDOSCOPIC IMPRESSION: Normal exam to the terminal ileum, except for sigmoid fixation, as previously noted  RECOMMENDATIONS: Repeat colon cancer screening in 10 years. Given the technical difficulties associated with this patient's colon, perhaps a noninvasive method of screening, such as stool DNA analysis, would be preferable  at that time.  REPEAT EXAM: 10 years  cc:Dr. Glori Bickers  _______________________________ eSigned:  Ronald Lobo, MD 2015/08/14 11:31 AM   CPT CODES: ICD CODES:  The ICD and CPT codes recommended by this software are interpretations from the data that the clinical staff has captured with the software.  The verification of the translation of this report to the ICD and CPT codes and modifiers is the sole responsibility of the health care institution and practicing physician where this report was generated.  [employer] will not be held responsible for the validity of the ICD and CPT codes included on this report.  AMA assumes no liability for data contained or not contained herein. CPT is a Designer, television/film set of the [employer].   PATIENT NAME:  Czerniak, Carla [name] MR#: [id number]

## 2015-07-31 NOTE — Anesthesia Postprocedure Evaluation (Signed)
  Anesthesia Post-op Note  Patient: Carla [name]  Procedure(s) Performed: Procedure(s): COLONOSCOPY WITH PROPOFOL (N/A)  Patient Location: Endoscopy Unit  Anesthesia Type:MAC  Level of Consciousness: awake, alert , oriented and patient cooperative  Airway and Oxygen Therapy: Patient Spontanous Breathing  Post-op Pain: none  Post-op Assessment: Post-op Vital signs reviewed, Patient's Cardiovascular Status Stable, Respiratory Function Stable, Patent Airway, No signs of Nausea or vomiting and Pain level controlled              Post-op Vital Signs: Reviewed and stable  Last Vitals:  Filed Vitals:   07/31/15 1200  BP: 123/95  Pulse: 48  Temp:   Resp: 14    Complications: No apparent anesthesia complications

## 2015-07-31 NOTE — Anesthesia Preprocedure Evaluation (Addendum)
Anesthesia Evaluation  Patient identified by MRN, date of birth, ID band Patient awake    Reviewed: Allergy & Precautions, NPO status , Patient's Chart, lab work & pertinent test results  History of Anesthesia Complications (+) PONV and history of anesthetic complications  Airway Mallampati: II  TM Distance: >3 FB Neck ROM: Full    Dental  (+) Dental Advisory Given   Pulmonary neg pulmonary ROS,    breath sounds clear to auscultation       Cardiovascular negative cardio ROS   Rhythm:Regular Rate:Normal     Neuro/Psych negative neurological ROS     GI/Hepatic Neg liver ROS, GERD  Controlled,  Endo/Other  negative endocrine ROS  Renal/GU negative Renal ROS     Musculoskeletal   Abdominal   Peds  Hematology   Anesthesia Other Findings   Reproductive/Obstetrics                             Anesthesia Physical Anesthesia Plan  ASA: II  Anesthesia Plan: MAC   Post-op Pain Management:    Induction: Intravenous  Airway Management Planned: Nasal Cannula  Additional Equipment:   Intra-op Plan:   Post-operative Plan:   Informed Consent: I have reviewed the patients History and Physical, chart, labs and discussed the procedure including the risks, benefits and alternatives for the proposed anesthesia with the patient or authorized representative who has indicated his/her understanding and acceptance.   Dental advisory given  Plan Discussed with: CRNA and Surgeon  Anesthesia Plan Comments: (Plan routine monitors, MAC)        Anesthesia Quick Evaluation

## 2015-08-01 ENCOUNTER — Encounter (HOSPITAL_COMMUNITY): Payer: Self-pay | Admitting: Gastroenterology

## 2015-10-05 ENCOUNTER — Telehealth: Payer: Self-pay | Admitting: Family Medicine

## 2015-10-05 DIAGNOSIS — Z0000 Encounter for general adult medical examination without abnormal findings: Secondary | ICD-10-CM

## 2015-10-05 NOTE — Telephone Encounter (Signed)
-----   Message from Marchia Bond sent at 09/30/2015  9:09 AM EST ----- Regarding: cpx labs Mon 1/9, need orders. Thanks! :-) Please order  future cpx labs for pt's upcoming lab appt. Thanks Aniceto Boss

## 2015-10-06 ENCOUNTER — Other Ambulatory Visit: Payer: BLUE CROSS/BLUE SHIELD

## 2015-10-10 ENCOUNTER — Encounter: Payer: Self-pay | Admitting: Family Medicine

## 2015-10-10 ENCOUNTER — Ambulatory Visit (INDEPENDENT_AMBULATORY_CARE_PROVIDER_SITE_OTHER): Payer: PPO | Admitting: Family Medicine

## 2015-10-10 ENCOUNTER — Encounter: Payer: BLUE CROSS/BLUE SHIELD | Admitting: Family Medicine

## 2015-10-10 VITALS — BP 114/66 | HR 65 | Temp 98.1°F | Ht 64.25 in | Wt 163.0 lb

## 2015-10-10 DIAGNOSIS — Z23 Encounter for immunization: Secondary | ICD-10-CM

## 2015-10-10 DIAGNOSIS — Z0000 Encounter for general adult medical examination without abnormal findings: Secondary | ICD-10-CM | POA: Diagnosis not present

## 2015-10-10 DIAGNOSIS — E2839 Other primary ovarian failure: Secondary | ICD-10-CM | POA: Diagnosis not present

## 2015-10-10 LAB — LIPID PANEL
CHOLESTEROL: 179 mg/dL (ref 0–200)
HDL: 60 mg/dL (ref >39.00)
LDL CALC: 106 mg/dL — AB (ref 0–99)
NonHDL: 118.62
Total CHOL/HDL Ratio: 3
Triglycerides: 65 mg/dL (ref 0.0–149.0)
VLDL: 13 mg/dL (ref 0.0–40.0)

## 2015-10-10 LAB — COMPREHENSIVE METABOLIC PANEL
ALT: 15 U/L (ref 0–35)
AST: 17 U/L (ref 0–37)
Albumin: 4.2 g/dL (ref 3.5–5.2)
Alkaline Phosphatase: 57 U/L (ref 39–117)
BUN: 15 mg/dL (ref 6–23)
CHLORIDE: 105 meq/L (ref 96–112)
CO2: 30 mEq/L (ref 19–32)
Calcium: 9.9 mg/dL (ref 8.4–10.5)
Creatinine, Ser: 0.84 mg/dL (ref 0.40–1.20)
GFR: 72.24 mL/min (ref >60.00)
GLUCOSE: 94 mg/dL (ref 70–99)
POTASSIUM: 4.2 meq/L (ref 3.5–5.1)
SODIUM: 140 meq/L (ref 135–145)
Total Bilirubin: 0.5 mg/dL (ref 0.2–1.2)
Total Protein: 8 g/dL (ref 6.0–8.3)

## 2015-10-10 LAB — CBC WITH DIFFERENTIAL/PLATELET
Basophils Absolute: 0 10*3/uL (ref 0.0–0.1)
Basophils Relative: 0.6 % (ref 0.0–3.0)
EOS ABS: 0.2 10*3/uL (ref 0.0–0.7)
Eosinophils Relative: 2 % (ref 0.0–5.0)
HCT: 36.8 % (ref 36.0–46.0)
Hemoglobin: 12.4 g/dL (ref 12.0–15.0)
Lymphocytes Relative: 28.1 % (ref 12.0–46.0)
Lymphs Abs: 2.2 10*3/uL (ref 0.7–4.0)
MCHC: 33.8 g/dL (ref 30.0–36.0)
MCV: 96.4 fl (ref 78.0–100.0)
MONO ABS: 0.6 10*3/uL (ref 0.1–1.0)
Monocytes Relative: 8.2 % (ref 3.0–12.0)
Neutro Abs: 4.8 10*3/uL (ref 1.4–7.7)
Neutrophils Relative %: 61.1 % (ref 43.0–77.0)
Platelets: 289 10*3/uL (ref 150.0–400.0)
RBC: 3.82 Mil/uL — AB (ref 3.87–5.11)
RDW: 15.1 % (ref 11.5–15.5)
WBC: 7.9 10*3/uL (ref 4.0–10.5)

## 2015-10-10 LAB — TSH: TSH: 3.13 u[IU]/mL (ref 0.35–4.50)

## 2015-10-10 NOTE — Progress Notes (Signed)
Subjective:    Patient ID: Carla [name], female    DOB: [birth year], 65 y.o.   MRN: [id number]  HPI Here for annual medicare wellness visit as well as chronic/acute medical problems with annual preventative exam  I have personally reviewed the Medicare Annual Wellness questionnaire and have noted 1. The patient's medical and social history 2. Their use of alcohol, tobacco or illicit drugs 3. Their current medications and supplements 4. The patient's functional ability including ADL's, fall risks, home safety risks and hearing or visual             impairment. 5. Diet and physical activities 6. Evidence for depression or mood disorders  The patients weight, height, BMI have been recorded in the chart and visual acuity is per eye clinic.  I have made referrals, counseling and provided education to the patient based review of the above and I have provided the pt with a written personalized care plan for preventive services. Reviewed and updated provider list, see scanned forms.  See scanned forms.  Routine anticipatory guidance given to patient.  See health maintenance. Colon cancer screening 11/6 - up to date /had to repeat prep with miralax / no recall due to age/will see Breast cancer screening 3/16 nl  Self breast exam-no breast lumps or changes  Hep C screen -not high risk/ pt declines  HIV screening -pt declines  Flu vaccine 10/16  Tetanus vaccine 9/12 Pneumovax - will get prevnar today  Zoster vaccine 10/13  dexa 2/10 normal range - no falls or fx in the past year, she does not take ca and vit D  Will start it , wants to do dexa this year  Pap nl 1/16 with neg HPV reflex test, no gyn symptoms  Advance directive - has a living will and POA Cognitive function addressed- see scanned forms- and if abnormal then additional documentation follows. No concerns   PMH and SH reviewed  Meds, vitals, and allergies reviewed.   ROS: See HPI.  Otherwise negative.    Due for labs - last  year looked good and good cholesterol    Patient Active Problem List   Diagnosis Date Noted  . Initial Medicare annual wellness visit 10/10/2015  . Estrogen deficiency 10/10/2015  . Encounter for routine gynecological examination 10/08/2014  . Other screening mammogram 07/05/2012  . Routine gynecological examination 06/22/2011  . Routine general medical examination at a health care facility 06/15/2011  . ALLERGIC RHINITIS 06/26/2008   Past Medical History  Diagnosis Date  . Allergic rhinitis, cause unspecified   . Anemia, unspecified   . Cystitis, unspecified   . Family history of osteoporosis   . Postnasal drip   . Syncope and collapse   . Carpal tunnel syndrome   . PONV (postoperative nausea and vomiting)    Past Surgical History  Procedure Laterality Date  . Carpal tunnel release    . Colonoscopy  2005  . Diagnostic laparoscopy      dx lack of pregnancy   . Eye surgery      lasik, cataract, prk,yag procedure  . Colonoscopy with propofol N/A 07/31/2015    Procedure: COLONOSCOPY WITH PROPOFOL;  Surgeon: Ronald Lobo, MD;  Location: [facility];  Service: Endoscopy;  Laterality: N/A;   Social History  Substance Use Topics  . Smoking status: Never Smoker   . Smokeless tobacco: Never Used  . Alcohol Use: 0.0 oz/week    0 Standard drinks or equivalent per week     Comment: Rare  Family History  Problem Relation Age of Onset  . Osteoporosis Mother   . Stroke Mother   . Coronary artery disease Father   . Stroke Father 64  . Diabetes      Aunts and uncles  . Breast cancer Maternal Aunt   . Breast cancer Paternal Aunt    Allergies  Allergen Reactions  . Codeine Nausea Only   Current Outpatient Prescriptions on File Prior to Visit  Medication Sig Dispense Refill  . cetirizine (ZYRTEC) 10 MG tablet Take 10 mg by mouth daily as needed for allergies.     . metroNIDAZOLE (METROGEL) 1 % gel APPLY TO FACE AT BEDTIME  0   No current facility-administered medications  on file prior to visit.    Review of Systems Review of Systems  Constitutional: Negative for fever, appetite change, fatigue and unexpected weight change.  Eyes: Negative for pain and visual disturbance.  Respiratory: Negative for cough and shortness of breath.   Cardiovascular: Negative for cp or palpitations    Gastrointestinal: Negative for nausea, diarrhea and constipation.  Genitourinary: Negative for urgency and frequency.  Skin: Negative for pallor or rash   Neurological: Negative for weakness, light-headedness, numbness and headaches.  Hematological: Negative for adenopathy. Does not bruise/bleed easily.  Psychiatric/Behavioral: Negative for dysphoric mood. The patient is not nervous/anxious.         Objective:   Physical Exam  Constitutional: She appears well-developed and well-nourished. No distress.  Well appearing   HENT:  Head: Normocephalic and atraumatic.  Right Ear: External ear normal.  Left Ear: External ear normal.  Mouth/Throat: Oropharynx is clear and moist.  Eyes: Conjunctivae and EOM are normal. Pupils are equal, round, and reactive to light. No scleral icterus.  Neck: Normal range of motion. Neck supple. No JVD present. Carotid bruit is not present. No thyromegaly present.  Cardiovascular: Normal rate, regular rhythm, normal heart sounds and intact distal pulses.  Exam reveals no gallop.   Pulmonary/Chest: Effort normal and breath sounds normal. No respiratory distress. She has no wheezes. She exhibits no tenderness.  Abdominal: Soft. Bowel sounds are normal. She exhibits no distension, no abdominal bruit and no mass. There is no tenderness.  Genitourinary: No breast swelling, tenderness, discharge or bleeding.  Breast exam: No mass, nodules, thickening, tenderness, bulging, retraction, inflamation, nipple discharge or skin changes noted.  No axillary or clavicular LA.      Musculoskeletal: Normal range of motion. She exhibits no edema or tenderness.  No  kyphosis   Lymphadenopathy:    She has no cervical adenopathy.  Neurological: She is alert. She has normal reflexes. No cranial nerve deficit. She exhibits normal muscle tone. Coordination normal.  Skin: Skin is warm and dry. No rash noted. No erythema. No pallor.  Lentigo and some SK  Psychiatric: She has a normal mood and affect.          Assessment & Plan:   Problem List Items Addressed This Visit      Other   Estrogen deficiency   Relevant Orders   DG Bone Density   Initial Medicare annual wellness visit - Primary    Reviewed health habits including diet and exercise and skin cancer prevention Reviewed appropriate screening tests for age  Also reviewed health mt list, fam hx and immunization status , as well as social and family history   See HPI Labs today prevnar vaccine today  Try to get 1200-1500 mg of calcium per day with at least 1000 iu of vitamin D -  for bone health  Stop at check out for referral for bone density test       Routine general medical examination at a health care facility    Reviewed health habits including diet and exercise and skin cancer prevention Reviewed appropriate screening tests for age  Also reviewed health mt list, fam hx and immunization status , as well as social and family history   See HPI Labs today prevnar vaccine today  Try to get 1200-1500 mg of calcium per day with at least 1000 iu of vitamin D - for bone health  Stop at check out for referral for bone density test        Other Visit Diagnoses    Need for prophylactic vaccination against Streptococcus pneumoniae (pneumococcus)        Relevant Orders    Pneumococcal conjugate vaccine 13-valent IM (Completed)

## 2015-10-10 NOTE — Patient Instructions (Addendum)
prevnar vaccine today  Try to get 1200-1500 mg of calcium per day with at least 1000 iu of vitamin D - for bone health  Stop at check out for referral for bone density test

## 2015-10-11 NOTE — Assessment & Plan Note (Signed)
Reviewed health habits including diet and exercise and skin cancer prevention Reviewed appropriate screening tests for age  Also reviewed health mt list, fam hx and immunization status , as well as social and family history   See HPI Labs today prevnar vaccine today  Try to get 1200-1500 mg of calcium per day with at least 1000 iu of vitamin D - for bone health  Stop at check out for referral for bone density test

## 2015-11-13 ENCOUNTER — Other Ambulatory Visit: Payer: Self-pay

## 2015-11-13 DIAGNOSIS — Z1231 Encounter for screening mammogram for malignant neoplasm of breast: Secondary | ICD-10-CM

## 2015-12-02 ENCOUNTER — Other Ambulatory Visit: Payer: Self-pay | Admitting: Family Medicine

## 2015-12-02 DIAGNOSIS — Z1231 Encounter for screening mammogram for malignant neoplasm of breast: Secondary | ICD-10-CM

## 2015-12-31 ENCOUNTER — Ambulatory Visit
Admission: RE | Admit: 2015-12-31 | Discharge: 2015-12-31 | Disposition: A | Discharge status: Home / Self Care | Payer: PPO | Source: Ambulatory Visit | Attending: Family Medicine | Admitting: Family Medicine

## 2015-12-31 DIAGNOSIS — Z13820 Encounter for screening for osteoporosis: Secondary | ICD-10-CM | POA: Insufficient documentation

## 2015-12-31 DIAGNOSIS — Z1231 Encounter for screening mammogram for malignant neoplasm of breast: Secondary | ICD-10-CM | POA: Diagnosis not present

## 2015-12-31 DIAGNOSIS — E2839 Other primary ovarian failure: Secondary | ICD-10-CM

## 2015-12-31 DIAGNOSIS — Z780 Asymptomatic menopausal state: Secondary | ICD-10-CM | POA: Diagnosis not present

## 2016-01-01 ENCOUNTER — Ambulatory Visit: Payer: PPO

## 2016-02-17 ENCOUNTER — Ambulatory Visit: Payer: PPO | Admitting: Family Medicine

## 2016-07-08 DIAGNOSIS — L821 Other seborrheic keratosis: Secondary | ICD-10-CM | POA: Diagnosis not present

## 2016-07-08 DIAGNOSIS — L738 Other specified follicular disorders: Secondary | ICD-10-CM | POA: Diagnosis not present

## 2016-07-08 DIAGNOSIS — X32XXXA Exposure to sunlight, initial encounter: Secondary | ICD-10-CM | POA: Diagnosis not present

## 2016-07-08 DIAGNOSIS — L718 Other rosacea: Secondary | ICD-10-CM | POA: Diagnosis not present

## 2016-07-08 DIAGNOSIS — L570 Actinic keratosis: Secondary | ICD-10-CM | POA: Diagnosis not present

## 2016-08-03 ENCOUNTER — Ambulatory Visit (INDEPENDENT_AMBULATORY_CARE_PROVIDER_SITE_OTHER): Payer: PPO | Admitting: Podiatry

## 2016-08-03 ENCOUNTER — Encounter: Payer: Self-pay | Admitting: Podiatry

## 2016-08-03 DIAGNOSIS — L03039 Cellulitis of unspecified toe: Secondary | ICD-10-CM

## 2016-08-03 DIAGNOSIS — L600 Ingrowing nail: Secondary | ICD-10-CM

## 2016-08-03 DIAGNOSIS — M79676 Pain in unspecified toe(s): Secondary | ICD-10-CM | POA: Diagnosis not present

## 2016-08-03 NOTE — Patient Instructions (Signed)
IngrownToenail An ingrown toenail occurs when the corner or sides of your toenail grow into the surrounding skin. The big toe is most commonly affected, but it can happen to any of your toes. If your ingrown toenail is not treated, you will be at risk for infection. CAUSES This condition Mccubbins be caused by:  Wearing shoes that are too small or tight.  Injury or trauma, such as stubbing your toe or having your toe stepped on.  Improper cutting or care of your toenails.  Being born with (congenital) nail or foot abnormalities, such as having a nail that is too big for your toe. RISK FACTORS Risk factors for an ingrown toenail include:  Age. Your nails tend to thicken as you get older, so ingrown nails are more common in older people.  Diabetes.  Cutting your toenails incorrectly.  Blood circulation problems. SYMPTOMS Symptoms Jezewski include:  Pain, soreness, or tenderness.  Redness.  Swelling.  Hardening of the skin surrounding the toe. Your ingrown toenail Lamphier be infected if there is fluid, pus, or drainage. DIAGNOSIS  An ingrown toenail Totino be diagnosed by medical history and physical exam. If your toenail is infected, your health care provider Metheney test a sample of the drainage. TREATMENT Treatment depends on the severity of your ingrown toenail. Some ingrown toenails Marron be treated at home. More severe or infected ingrown toenails Stein require surgery to remove all or part of the nail. Infected ingrown toenails Newgent also be treated with antibiotic medicines. HOME CARE INSTRUCTIONS  If you were prescribed an antibiotic medicine, finish all of it even if you start to feel better.  Soak your foot in warm soapy water for 20 minutes, 3 times per day or as directed by your health care provider.  Carefully lift the edge of the nail away from the sore skin by wedging a small piece of cotton under the corner of the nail. This Licht help with the pain. Be careful not to cause more injury to  the area.  Wear shoes that fit well. If your ingrown toenail is causing you pain, try wearing sandals, if possible.  Trim your toenails regularly and carefully. Do not cut them in a curved shape. Cut your toenails straight across. This prevents injury to the skin at the corners of the toenail.  Keep your feet clean and dry.  If you are having trouble walking and are given crutches by your health care provider, use them as directed.  Do not pick at your toenail or try to remove it yourself.  Take medicines only as directed by your health care provider.  Keep all follow-up visits as directed by your health care provider. This is important. SEEK MEDICAL CARE IF:  Your symptoms do not improve with treatment. SEEK IMMEDIATE MEDICAL CARE IF:  You have red streaks that start at your foot and go up your leg.  You have a fever.  You have increased redness, swelling, or pain.  You have fluid, blood, or pus coming from your toenail.  ANTIBACTERIAL SOAP INSTRUCTIONS  THE DAY AFTER PROCEDURE  Please follow the instructions your doctor has marked.   Shower as usual. Before getting out, place a drop of antibacterial liquid soap (Dial) on a wet, clean washcloth.  Gently wipe washcloth over affected area.  Afterward, rinse the area with warm water.  Blot the area dry with a soft cloth and cover with antibiotic ointment (neosporin, polysporin, bacitracin) and band aid or gauze and tape Place 3-4 drops of  antibacterial liquid soap in a quart of warm tap water.  Submerge foot into water for 20 minutes.  If bandage was applied after your procedure, leave on to allow for easy lift off, then remove and continue with soak for the remaining time.  Next, blot area dry with a soft cloth and cover with a bandage.  Apply other medications as directed by your doctor, such as cortisporin otic solution (eardrops) or neosporin antibiotic ointmentIngrown    This information is not intended to replace advice  given to you by your health care provider. Make sure you discuss any questions you have with your health care provider.   Document Released: 09/10/2000 Document Revised: 01/28/2015 Document Reviewed: 08/07/2014 Elsevier Interactive Patient Education [employer].

## 2016-08-03 NOTE — Progress Notes (Signed)
Patient ID: Carla [name], female   DOB: 10/31/1950, 65 y.o.   MRN: [id number] Subjective: Patient presents today for evaluation of pain in her toe(s). Patient is concerned for possible ingrown nail. Patient states that the pain has been present for a few weeks now. Patient presents today for further treatment and evaluation.  Objective:  General: Well developed, nourished, in no acute distress, alert and oriented x3   Dermatology: Skin is warm, dry and supple bilateral. Both medial and lateral borders of the bilateral great toes appears to be erythematous with evidence of an ingrowing nail. Purulent drainage noted with intruding nail into the respective nail fold. Pain on palpation noted to the border of the nail fold. The remaining nails appear unremarkable at this time. There are no open sores, lesions.  Vascular: Dorsalis Pedis artery and Posterior Tibial artery pedal pulses palpable. No lower extremity edema noted.   Neruologic: Grossly intact via light touch bilateral.  Musculoskeletal: Muscular strength within normal limits in all groups bilateral. Normal range of motion noted to all pedal and ankle joints.   Assesement: #1 ingrowing nail both medial and lateral borders of the bilateral great toes #2 paronychia both medial and lateral borders of the bilateral great toes #3 pain in bilateral great toes   Plan of Care:  1. Patient evaluated.  2. Discussed treatment alternatives and plan of care. Explained nail avulsion procedure and post procedure course to patient. 3. Patient opted for permanent partial nail avulsion to both the medial and lateral borders of the bilateral great toes.  4. Prior to procedure, local anesthesia infiltration utilized using 3 ml of a 50:50 mixture of 2% plain lidocaine and 0.5% plain marcaine in a normal hallux block fashion and a betadine prep performed.  5. Partial permanent nail avulsion with chemical matrixectomy performed using [id number] applications of  phenol followed by alcohol flush.  6. Light dressing applied. 7. Return to clinic in 2 weeks.   Dr. Edrick Kins [facility]

## 2016-08-03 NOTE — Progress Notes (Signed)
   Subjective:    Patient ID: Carla [name], female    DOB: Dec 25, 1950, 65 y.o.   MRN: [id number]  HPI  I have a history of ingrown toe nails on both big toes on both sides.  I am hoping he can fix them today.     Review of Systems  All other systems reviewed and are negative.      Objective:   Physical Exam        Assessment & Plan:

## 2016-08-04 ENCOUNTER — Telehealth: Payer: Self-pay | Admitting: *Deleted

## 2016-08-04 NOTE — Telephone Encounter (Signed)
Pt asked does she soak 1, 2 or 3 times a day. I instructed pt to soak 20 minutes 2 times daily for about 4 - 6 weeks and cover with antibiotic dressing , then when beginning to get a dry hard scab to the area at about 4 weeks Jennings switch to 1 one soak daily and cover with an antibiotic ointment dressing when up walking around and in shoes but if resting Lenahan allow to air dry. Pt states understanding.

## 2016-08-17 ENCOUNTER — Ambulatory Visit (INDEPENDENT_AMBULATORY_CARE_PROVIDER_SITE_OTHER): Payer: PPO | Admitting: Podiatry

## 2016-08-17 DIAGNOSIS — L03039 Cellulitis of unspecified toe: Secondary | ICD-10-CM | POA: Diagnosis not present

## 2016-08-17 DIAGNOSIS — S91109D Unspecified open wound of unspecified toe(s) without damage to nail, subsequent encounter: Secondary | ICD-10-CM

## 2016-08-17 DIAGNOSIS — M79676 Pain in unspecified toe(s): Secondary | ICD-10-CM

## 2016-08-17 NOTE — Progress Notes (Signed)

## 2016-11-25 ENCOUNTER — Telehealth: Payer: Self-pay | Admitting: Family Medicine

## 2016-11-25 DIAGNOSIS — Z0000 Encounter for general adult medical examination without abnormal findings: Secondary | ICD-10-CM

## 2016-11-25 DIAGNOSIS — Z1159 Encounter for screening for other viral diseases: Secondary | ICD-10-CM | POA: Insufficient documentation

## 2016-11-25 NOTE — Telephone Encounter (Signed)
-----   Message from Eustace Pen, LPN sent at [phone]  3:45 PM EST ----- Regarding: Labs 3/2 Please add Hep C to lab orders. Thank you.

## 2016-11-26 ENCOUNTER — Ambulatory Visit: Payer: PPO

## 2016-12-07 ENCOUNTER — Encounter: Payer: PPO | Admitting: Family Medicine

## 2016-12-14 ENCOUNTER — Ambulatory Visit (INDEPENDENT_AMBULATORY_CARE_PROVIDER_SITE_OTHER): Payer: PPO | Admitting: Family Medicine

## 2016-12-14 ENCOUNTER — Encounter: Payer: Self-pay | Admitting: Family Medicine

## 2016-12-14 VITALS — BP 122/70 | HR 63 | Temp 97.7°F | Ht 64.25 in | Wt 160.5 lb

## 2016-12-14 DIAGNOSIS — Z1159 Encounter for screening for other viral diseases: Secondary | ICD-10-CM | POA: Diagnosis not present

## 2016-12-14 DIAGNOSIS — Z23 Encounter for immunization: Secondary | ICD-10-CM | POA: Diagnosis not present

## 2016-12-14 DIAGNOSIS — Z0000 Encounter for general adult medical examination without abnormal findings: Secondary | ICD-10-CM | POA: Diagnosis not present

## 2016-12-14 LAB — LIPID PANEL
CHOL/HDL RATIO: 3
Cholesterol: 146 mg/dL (ref 0–200)
HDL: 48.7 mg/dL (ref >39.00)
LDL CALC: 81 mg/dL (ref 0–99)
NONHDL: 96.84
TRIGLYCERIDES: 81 mg/dL (ref 0.0–149.0)
VLDL: 16.2 mg/dL (ref 0.0–40.0)

## 2016-12-14 LAB — COMPREHENSIVE METABOLIC PANEL
ALT: 20 U/L (ref 0–35)
AST: 19 U/L (ref 0–37)
Albumin: 4.3 g/dL (ref 3.5–5.2)
Alkaline Phosphatase: 52 U/L (ref 39–117)
BUN: 15 mg/dL (ref 6–23)
CALCIUM: 9.8 mg/dL (ref 8.4–10.5)
CHLORIDE: 105 meq/L (ref 96–112)
CO2: 29 meq/L (ref 19–32)
CREATININE: 0.83 mg/dL (ref 0.40–1.20)
GFR: 72.98 mL/min (ref >60.00)
GLUCOSE: 92 mg/dL (ref 70–99)
Potassium: 3.6 mEq/L (ref 3.5–5.1)
SODIUM: 139 meq/L (ref 135–145)
Total Bilirubin: 0.5 mg/dL (ref 0.2–1.2)
Total Protein: 7.7 g/dL (ref 6.0–8.3)

## 2016-12-14 LAB — CBC WITH DIFFERENTIAL/PLATELET
BASOS ABS: 0.1 10*3/uL (ref 0.0–0.1)
BASOS PCT: 0.8 % (ref 0.0–3.0)
EOS ABS: 0.1 10*3/uL (ref 0.0–0.7)
Eosinophils Relative: 1.9 % (ref 0.0–5.0)
HCT: 34.8 % — ABNORMAL LOW (ref 36.0–46.0)
Hemoglobin: 11.8 g/dL — ABNORMAL LOW (ref 12.0–15.0)
LYMPHS ABS: 2.2 10*3/uL (ref 0.7–4.0)
Lymphocytes Relative: 28.4 % (ref 12.0–46.0)
MCHC: 33.9 g/dL (ref 30.0–36.0)
MCV: 94.5 fl (ref 78.0–100.0)
MONO ABS: 0.8 10*3/uL (ref 0.1–1.0)
Monocytes Relative: 10.7 % (ref 3.0–12.0)
NEUTROS ABS: 4.6 10*3/uL (ref 1.4–7.7)
NEUTROS PCT: 58.2 % (ref 43.0–77.0)
PLATELETS: 288 10*3/uL (ref 150.0–400.0)
RBC: 3.68 Mil/uL — ABNORMAL LOW (ref 3.87–5.11)
RDW: 15.9 % — AB (ref 11.5–15.5)
WBC: 7.9 10*3/uL (ref 4.0–10.5)

## 2016-12-14 LAB — TSH: TSH: 5.22 u[IU]/mL — ABNORMAL HIGH (ref 0.35–4.50)

## 2016-12-14 NOTE — Patient Instructions (Addendum)
Get your mammogram as planned in April  Take care of yourself Keep exercising  Labs today  Pneumovax 23 today   If you want to do the AMW (medicare interview)- call and make an appt with Katha Cabal our nurse

## 2016-12-14 NOTE — Progress Notes (Signed)
Subjective:    Patient ID: Carla [name], female    DOB: May 07, 1951, 66 y.o.   MRN: [id number]  HPI Here for health maintenance exam and to review chronic medical problems    Doing well overall - had a good winter  Had a stomach bug last week and got over it    She has not yet had AMW  Wt Readings from Last 3 Encounters:  12/14/16 160 lb 8 oz (72.8 kg)  10/10/15 163 lb (73.9 kg)  07/31/15 163 lb (73.9 kg)  down 3 lb - she has worked on wt loss (she went up before she lost more) [facility]  Also excited to start walking  bmi 27.34  Hep C screen -lab ordered   Mammogram 4/17 nl - has it scheduled [facility] Self breast exam -no changes or lumps   Pap/gyn care 1/16 nl pap  No gyn problems  She does have some urine incontinence    Zoster vaccine 10/13 Flu vaccine 10/17 Tetanus vaccine 9/12 PNA due for PPSV23- will get that today   Colonoscopy/ screening 11/16 normal with 10 y recall  dexa 4/17 normal Mother had OP  No falls or broken bones  Taking her ca and D Exercising   BP Readings from Last 3 Encounters:  12/14/16 122/70  10/10/15 114/66  07/31/15 (!) 123/95        Patient Active Problem List   Diagnosis Date Noted  . Need for hepatitis C screening test 11/25/2016  . Initial Medicare annual wellness visit 10/10/2015  . Estrogen deficiency 10/10/2015  . Encounter for routine gynecological examination 10/08/2014  . Other screening mammogram 07/05/2012  . Routine gynecological examination 06/22/2011  . Routine general medical examination at a health care facility 06/15/2011  . ALLERGIC RHINITIS 06/26/2008   Past Medical History:  Diagnosis Date  . Allergic rhinitis, cause unspecified   . Anemia, unspecified   . Carpal tunnel syndrome   . Cystitis, unspecified   . Family history of osteoporosis   . PONV (postoperative nausea and vomiting)   . Postnasal drip   . Syncope and collapse    Past Surgical  History:  Procedure Laterality Date  . CARPAL TUNNEL RELEASE    . COLONOSCOPY  2005  . COLONOSCOPY WITH PROPOFOL N/A 07/31/2015   Procedure: COLONOSCOPY WITH PROPOFOL;  Surgeon: Ronald Lobo, MD;  Location: [facility];  Service: Endoscopy;  Laterality: N/A;  . DIAGNOSTIC LAPAROSCOPY     dx lack of pregnancy   . EYE SURGERY     lasik, cataract, prk,yag procedure   Social History  Substance Use Topics  . Smoking status: Never Smoker  . Smokeless tobacco: Never Used  . Alcohol use 0.0 oz/week     Comment: Rare   Family History  Problem Relation Age of Onset  . Osteoporosis Mother   . Stroke Mother   . Coronary artery disease Father   . Stroke Father 49  . Diabetes      Aunts and uncles  . Breast cancer Paternal Aunt    Allergies  Allergen Reactions  . Codeine Nausea Only   Current Outpatient Prescriptions on File Prior to Visit  Medication Sig Dispense Refill  . Calcium Carb-Cholecalciferol (CALCIUM 600+D3 PO) Take by mouth.    . cetirizine (ZYRTEC) 10 MG tablet Take 10 mg by mouth daily as needed for allergies.      No current facility-administered medications on file prior to  visit.     Review of Systems    Review of Systems  Constitutional: Negative for fever, appetite change, fatigue and unexpected weight change.  Eyes: Negative for pain and visual disturbance.  Respiratory: Negative for cough and shortness of breath.   Cardiovascular: Negative for cp or palpitations    Gastrointestinal: Negative for nausea, diarrhea and constipation.  Genitourinary: Negative for urgency and frequency.  Skin: Negative for pallor or rash   Neurological: Negative for weakness, light-headedness, numbness and headaches.  Hematological: Negative for adenopathy. Does not bruise/bleed easily.  Psychiatric/Behavioral: Negative for dysphoric mood. The patient is not nervous/anxious.      Objective:   Physical Exam  Constitutional: She appears well-developed and well-nourished. No  distress.  Well appearing   HENT:  Head: Normocephalic and atraumatic.  Right Ear: External ear normal.  Left Ear: External ear normal.  Mouth/Throat: Oropharynx is clear and moist.  Eyes: Conjunctivae and EOM are normal. Pupils are equal, round, and reactive to light. No scleral icterus.  Neck: Normal range of motion. Neck supple. No JVD present. Carotid bruit is not present. No thyromegaly present.  Cardiovascular: Normal rate, regular rhythm, normal heart sounds and intact distal pulses.  Exam reveals no gallop.   Pulmonary/Chest: Effort normal and breath sounds normal. No respiratory distress. She has no wheezes. She exhibits no tenderness.  Abdominal: Soft. Bowel sounds are normal. She exhibits no distension, no abdominal bruit and no mass. There is no tenderness.  Genitourinary: No breast swelling, tenderness, discharge or bleeding.  Genitourinary Comments: Breast exam: No mass, nodules, thickening, tenderness, bulging, retraction, inflamation, nipple discharge or skin changes noted.  No axillary or clavicular LA.      Musculoskeletal: Normal range of motion. She exhibits no edema or tenderness.  Lymphadenopathy:    She has no cervical adenopathy.  Neurological: She is alert. She has normal reflexes. No cranial nerve deficit. She exhibits normal muscle tone. Coordination normal.  Skin: Skin is warm and dry. No rash noted. No erythema. No pallor.  Some lentigines   Psychiatric: She has a normal mood and affect.          Assessment & Plan:   Problem List Items Addressed This Visit      Other   Need for hepatitis C screening test    Lab today  Low risk      Routine general medical examination at a health care facility - Primary    Reviewed health habits including diet and exercise and skin cancer prevention Reviewed appropriate screening tests for age  Also reviewed health mt list, fam hx and immunization status , as well as social and family history    Urged her to  schedule AMW Labs ordered for wellness AVS:  Get your mammogram as planned in April  Take care of yourself Keep exercising  Labs today  Pneumovax 23 today         Other Visit Diagnoses    Need for 23-polyvalent pneumococcal polysaccharide vaccine       Relevant Orders   Pneumococcal polysaccharide vaccine 23-valent greater than or equal to 2yo subcutaneous/IM (Completed)

## 2016-12-14 NOTE — Progress Notes (Signed)
Pre visit review using our clinic review tool, if applicable. No additional management support is needed unless otherwise documented below in the visit note. 

## 2016-12-15 ENCOUNTER — Other Ambulatory Visit (INDEPENDENT_AMBULATORY_CARE_PROVIDER_SITE_OTHER): Payer: PPO

## 2016-12-15 DIAGNOSIS — R946 Abnormal results of thyroid function studies: Secondary | ICD-10-CM | POA: Diagnosis not present

## 2016-12-15 LAB — HEPATITIS C ANTIBODY: HCV Ab: NEGATIVE

## 2016-12-15 LAB — T4, FREE: FREE T4: 0.94 ng/dL (ref 0.60–1.60)

## 2016-12-15 NOTE — Assessment & Plan Note (Signed)
Lab today  Low risk

## 2016-12-15 NOTE — Assessment & Plan Note (Signed)
Reviewed health habits including diet and exercise and skin cancer prevention Reviewed appropriate screening tests for age  Also reviewed health mt list, fam hx and immunization status , as well as social and family history    Urged her to schedule AMW Labs ordered for wellness AVS:  Get your mammogram as planned in April  Take care of yourself Keep exercising  Labs today  Pneumovax 23 today

## 2017-01-04 ENCOUNTER — Other Ambulatory Visit: Payer: Self-pay | Admitting: Family Medicine

## 2017-01-04 DIAGNOSIS — Z961 Presence of intraocular lens: Secondary | ICD-10-CM | POA: Diagnosis not present

## 2017-01-04 DIAGNOSIS — Z1231 Encounter for screening mammogram for malignant neoplasm of breast: Secondary | ICD-10-CM

## 2017-01-25 ENCOUNTER — Ambulatory Visit
Admission: RE | Admit: 2017-01-25 | Discharge: 2017-01-25 | Disposition: A | Discharge status: Home / Self Care | Payer: PPO | Source: Ambulatory Visit | Attending: Family Medicine | Admitting: Family Medicine

## 2017-01-25 DIAGNOSIS — Z1231 Encounter for screening mammogram for malignant neoplasm of breast: Secondary | ICD-10-CM | POA: Insufficient documentation

## 2017-01-26 MED ORDER — LIDOCAINE (PF) 10 MG/ML (1 %) IJ SOLN
.1-2 mL | INTRAMUSCULAR | 0 refills | Status: DC | PRN
Start: 2017-01-26 — End: 2017-01-26

## 2017-01-26 MED ORDER — ACETAMINOPHEN 160 MG/5 ML PO SOLN
15 mg/kg | ORAL | 0 refills | Status: DC | PRN
Start: 2017-01-26 — End: 2018-05-09

## 2017-01-26 MED ORDER — BUPIVACAINE 0.25 % (2.5 MG/ML) IJ SOLN
0 refills | Status: DC
Start: 2017-01-26 — End: 2017-01-26

## 2017-01-26 MED ORDER — LIDOCAINE 1% (BUFFERED) VIAL
0 refills | Status: DC
Start: 2017-01-26 — End: 2017-01-26

## 2017-01-26 MED ORDER — LACTATED RINGERS IV SOLP
1000 mL | INTRAVENOUS | 0 refills | Status: DC
Start: 2017-01-26 — End: 2017-01-26

## 2017-01-26 MED ORDER — IBUPROFEN 800 MG PO TAB
800 mg | ORAL_TABLET | ORAL | 0 refills | Status: AC | PRN
Start: 2017-01-26 — End: ?

## 2017-03-10 ENCOUNTER — Encounter: Admit: 2017-03-10 | Discharge: 2017-03-10 | Payer: MEDICARE | Primary: Family

## 2017-03-10 DIAGNOSIS — Z85820 Personal history of malignant melanoma of skin: ICD-10-CM

## 2017-03-10 DIAGNOSIS — C439 Malignant melanoma of skin, unspecified: ICD-10-CM

## 2017-03-10 DIAGNOSIS — C50412 Malignant neoplasm of upper-outer quadrant of left female breast: ICD-10-CM

## 2017-03-10 DIAGNOSIS — Z9889 Other specified postprocedural states: ICD-10-CM

## 2017-03-10 DIAGNOSIS — C4362 Malignant melanoma of left upper limb, including shoulder: Principal | ICD-10-CM

## 2017-03-10 LAB — LDH-LACTATE DEHYDROGENASE: Lab: 199 U/L (ref 100–210)

## 2017-03-14 NOTE — Progress Notes
Name: Kaitlyn Friedman          MRN: [id number]      DOB: [birth year]      AGE: 66 y.o.   DATE OF SERVICE: 03/10/2017    Subjective:             Reason for Visit:  Heme/Onc Care      Kaitlyn Friedman is a 66 y.o. female.     Cancer Staging  Malignant neoplasm of upper-outer quadrant of left female breast [facility])  Staging form: Breast, AJCC 7th Edition  - Pathologic: Stage IA (T1, N0, cM0) - Signed by Cammy Copa, [name] on 03/28/2015    Melanoma [facility])  Staging form: Melanoma of the Skin, AJCC 7th Edition  - Pathologic: Stage IB (T2a, N0, cM0) - Signed by Sherril Croon, [name] on 02/21/2015      History of Present Illness  Ms. 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  I performed a wide local excision with a sentinel lymph node biopsy on Bell 13, 2016.  She now has developed a new mass in the subcutaneous tissues.  I obtained ultrasound, which was unable to identify that mass.  Hence, I recommended excision which I performed on 01/26/2017 which revealed an angiolipoma.  She denies any erythema or discharge.       Review of Systems   Constitutional: Negative.          Objective:         ??? acetaminophen (TYLENOL) 160 mg/5 mL oral solution Take 47.27 mL by mouth every 6 hours as needed. Max of 4,000 mg of acetaminophen in 24 hours.   ??? Cholecalciferol (Vitamin D3) 2,000 unit cap take by mouth daily   ??? diazepam (VALIUM) 5 mg tablet Take 5 mg by mouth every 6 hours as needed for Anxiety. Pt states only takes for dentist visit only   ??? ibuprofen (MOTRIN) 800 mg tablet Take 1 tablet by mouth every 6 hours as needed for Pain. Take with food.   ??? lisinopril (PRINIVIL, ZESTRIL) 40 mg tablet Take 40 mg by mouth daily.   ??? metoprolol tartrate (LOPRESSOR) 50 mg tablet 50 mg.   ??? metoprolol XL (TOPROL XL) 50 mg tablet Take 50 mg by mouth at bedtime daily.   ??? MULTIVITAMIN PO Take 1 tablet by mouth daily. ??? Vitamin A-Vitamin C-Vit E-Min (PRESERVISION AREDS) cap Take 1 capsule by mouth daily.     Vitals:    03/10/17 1010   BP: 155/59   Pulse: 64   Resp: 16   Temp: 36.8 ???C (98.2 ???F)   TempSrc: Oral   SpO2: 98%   Weight: 102.6 kg (226 lb 3.2 oz)     Body mass index is 37.64 kg/m???.     Pain Score: Zero         Pain Addressed:  N/A    Patient Evaluated for a Clinical Trial: No treatment clinical trial available for this patient.     [place] Cooperative Oncology Group performance status is 0, Fully active, able to carry on all pre-disease performance without restriction.Marland Kitchen     Physical Exam   Constitutional: She appears well-developed and well-nourished.   Skin:   Left arm incision healing well without erythema or discharge             Assessment and Plan:  66 year old female with a history of a 2.10mm Breslow thickness left arm melanoma doing  well after excision of a left arm angiolipoma    RTC in 6 months for surveillance

## 2017-09-06 ENCOUNTER — Encounter: Admit: 2017-09-06 | Discharge: 2017-09-06 | Payer: MEDICARE | Primary: Family

## 2017-09-06 DIAGNOSIS — H3530 Unspecified macular degeneration: ICD-10-CM

## 2017-09-06 DIAGNOSIS — C439 Malignant melanoma of skin, unspecified: ICD-10-CM

## 2017-09-06 DIAGNOSIS — C4362 Malignant melanoma of left upper limb, including shoulder: Principal | ICD-10-CM

## 2017-09-06 DIAGNOSIS — R002 Palpitations: ICD-10-CM

## 2017-09-06 DIAGNOSIS — M1990 Unspecified osteoarthritis, unspecified site: ICD-10-CM

## 2017-09-06 DIAGNOSIS — C50919 Malignant neoplasm of unspecified site of unspecified female breast: ICD-10-CM

## 2017-09-06 DIAGNOSIS — I10 Essential (primary) hypertension: Principal | ICD-10-CM

## 2017-09-06 DIAGNOSIS — I471 Supraventricular tachycardia: ICD-10-CM

## 2017-09-06 DIAGNOSIS — E669 Obesity, unspecified: ICD-10-CM

## 2017-09-06 LAB — LDH-LACTATE DEHYDROGENASE: Lab: 167 U/L (ref 100–210)

## 2017-09-06 NOTE — Progress Notes
Date of Service: 09/06/2017      Subjective     Reason for Visit:  Heme/Onc Care      Kaitlyn Friedman is a 66 y.o. female.    Cancer Staging  Malignant neoplasm of upper-outer quadrant of left female breast [facility])  Staging form: Breast, AJCC 7th Edition  - Pathologic: Stage IA (T1, N0, cM0) - Signed by Cammy Copa, [name] on 03/28/2015    Melanoma [facility])  Staging form: Melanoma of the Skin, AJCC 7th Edition  - Pathologic: Stage IB (T2a, N0, cM0) - Signed by Sherril Croon, [name] on 02/21/2015      Kaitlyn Friedman is a 66 y.o. 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 She noticed the lesion changing over a few months and sought evaluation from dermatology who performed the initial biopsy on 12/24/2014 showing 2.1 mm melanoma. She also has a history of left breast cancer in 2013.    On 02/07/2015, Dr. Lorelei Pont performed a wide local excision of the left arm, intraoperative lymphatic mapping, left axillary sentinel lymph node biopsy. Pathology revealed no residual melanoma and axillary lymph node basin revealed 0 of 8 nodes positive for metastasis.    Patient presents for a routine surveillance visit today. Patient continues to follow with dermatology regularly and denies any new  subcutaneous masses or lymphadenopathy. She reports that dermatology removed a melanoma from her back; stitches remain in place and she is scheduled for dermatology follow on 09/09/2017 to discuss results and plans. She requests breast exam today. She has questions about the necessity of mammogram screening after mastectomy. She had bilateral mastectomy in 07/2012. A local physician is recommending mammogram and she is confused about this recommendation due to other physicians not recommending this for her.           Review of Systems   Constitutional: Negative for chills, fatigue and fever.   Respiratory: Negative for cough, shortness of breath and wheezing. Cardiovascular: Negative for chest pain, palpitations and leg swelling.   Musculoskeletal: Negative for arthralgias and back pain.   Skin:        No new dysplastic lesions or subcutaneous masses.   Hematological: Negative for adenopathy. Does not bruise/bleed easily.   All other systems reviewed and are negative.        Past Medical History:   Diagnosis Date   ??? Arthritis     R hip, fingers   ??? 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 Hypertension    ??? Macular degeneration    ??? Melanoma (HCC) 2016   ??? Obese    ??? Palpitations    ??? SVT (supraventricular tachycardia) (HCC)     s/p cardioversion       Past Surgical History:   Procedure Laterality Date   ??? HX HYSTERECTOMY  1978    partial   ??? HX BREAST BIOPSY Left 2009    chest wall   ??? HX BREAST LUMPECTOMY  June 29, 2012    Breast profile revealed 4% estrogen receptor, 3% progesterone receptor and HER-2 was 3+.   ??? LYMPH NODE BIOPSY  June 29, 2012   ??? BREAST RECONSTRUCTION  08/11/2012   ??? HX MASTECTOMY Bilateral Nov. 15, 2013   ??? BREAST AUGMENTATION Bilateral 11/2012    mastectomy   ??? SOFT TISSUE BIOPSY Left 12/2014    left upper  arm       Family History   Problem Relation Age of Onset   ??? Cancer Sister         Multiple Myeloma dx in her late 30's early 20's   ??? Cancer Brother         testicular cancer late 30's to early 53's   ??? Cancer-Breast Maternal Aunt         dx in her 19's       Social History     Socioeconomic History   ??? Marital status: Divorced     Spouse name: Not on file   ??? Number of children: Not on file   ??? Years of education: Not on file   ??? Highest education level: Not on file   Social Needs   ??? Financial resource strain: Not on file   ??? Food insecurity - worry: Not on file   ??? Food insecurity - inability: Not on file   ??? Transportation needs - medical: Not on file   ??? Transportation needs - non-medical: Not on file Occupational History   ??? Not on file   Tobacco Use   ??? Smoking status: Never Smoker   ??? Smokeless tobacco: Never Used   Substance and Sexual Activity   ??? Alcohol use: Yes     Alcohol/week: 0.6 oz     Types: 1 Cans of beer per week     Comment: on occasion   ??? Drug use: No   ??? Sexual activity: Not on file   Other Topics Concern   ??? Not on file   Social History Narrative   ??? Not on file         Objective:  ??? acetaminophen (TYLENOL) 160 mg/5 mL oral solution Take 47.27 mL by mouth every 6 hours as needed. Max of 4,000 mg of acetaminophen in 24 hours.   ??? Cholecalciferol (Vitamin D3) 2,000 unit cap take by mouth daily   ??? diazepam (VALIUM) 5 mg tablet Take 5 mg by mouth every 6 hours as needed for Anxiety. Pt states only takes for dentist visit only   ??? ibuprofen (MOTRIN) 800 mg tablet Take 1 tablet by mouth every 6 hours as needed for Pain. Take with food.   ??? lisinopril (PRINIVIL, ZESTRIL) 40 mg tablet Take 40 mg by mouth daily.   ??? metoprolol tartrate (LOPRESSOR) 50 mg tablet 50 mg.   ??? metoprolol XL (TOPROL XL) 50 mg tablet Take 50 mg by mouth at bedtime daily.   ??? MULTIVITAMIN PO Take 1 tablet by mouth daily.   ??? Vitamin A-Vitamin C-Vit E-Min (PRESERVISION AREDS) cap Take 1 capsule by mouth daily.       Vitals:    09/06/17 1010 09/06/17 1012   Pulse: 63    Resp: 16    Temp: 36.8 ???C (98.3 ???F)    TempSrc: Oral Oral   SpO2: 97%    Weight: 103.9 kg (229 lb)    Height: 166 cm (65.35)        Body mass index is 37.7 kg/m???.    Pain Score: Zero       Pain Rating:          Pain Addressed:  N/A    Patient Evaluated for a Clinical Trial: No treatment clinical trial available for this patient.    [place] Cooperative Oncology Group performance status is 0, Fully active, able to carry on all pre-disease performance without restriction.Marland Kitchen     Physical Exam   Constitutional:  She is oriented to person, place, and time. She appears well-developed and well-nourished.   HENT:   Head: Normocephalic and atraumatic. Eyes: Pupils are equal, round, and reactive to light.   Neck: Normal range of motion. Neck supple.   Cardiovascular: Normal rate, regular rhythm and normal heart sounds.   Pulmonary/Chest: Effort normal and breath sounds normal.       Abdominal: Soft. She exhibits no distension. There is no tenderness.   Musculoskeletal: Normal range of motion.   Lymphadenopathy:     She has no cervical adenopathy.     She has no axillary adenopathy.        Right: No inguinal and no supraclavicular adenopathy present.        Left: No inguinal and no supraclavicular adenopathy present.   Neurological: She is alert and oriented to person, place, and time.   Skin: Skin is warm and dry.        No dysplastic nevi or subcutaneous masses   Psychiatric: She has a normal mood and affect. Her behavior is normal. Judgment and thought content normal.   Vitals reviewed.          Assessment and Plan:  Kaitlyn Friedman is a 66 y.o. female with history of 2.1 mm melanoma of the left arm    - There are no concerns on exam today. However, she reports that the recent biopsy by dermatology showed melanoma. Will request pathology result for our records and to determine if additional evaluation is needed.  - Breast exam performed today; no concerns noted. Discussed varying recommendations of mammography and even ultrasound on reconstructed breasts. She is beyond the 5 year mark from mastectomy and has no concerns. Based on current literature, no additional screening imaging is needed.  - RTC in 6 months for surveillance with CXR and LDH  - Dermatology surveillance    Sherril Croon, [name]     Collaborating physician: Lorelei Pont, MD

## 2017-10-13 DIAGNOSIS — L570 Actinic keratosis: Secondary | ICD-10-CM | POA: Diagnosis not present

## 2017-10-13 DIAGNOSIS — D225 Melanocytic nevi of trunk: Secondary | ICD-10-CM | POA: Diagnosis not present

## 2017-10-13 DIAGNOSIS — X32XXXA Exposure to sunlight, initial encounter: Secondary | ICD-10-CM | POA: Diagnosis not present

## 2017-10-13 DIAGNOSIS — D2262 Melanocytic nevi of left upper limb, including shoulder: Secondary | ICD-10-CM | POA: Diagnosis not present

## 2017-10-13 DIAGNOSIS — D2271 Melanocytic nevi of right lower limb, including hip: Secondary | ICD-10-CM | POA: Diagnosis not present

## 2017-10-13 DIAGNOSIS — D2272 Melanocytic nevi of left lower limb, including hip: Secondary | ICD-10-CM | POA: Diagnosis not present

## 2017-12-19 ENCOUNTER — Telehealth: Payer: Self-pay | Admitting: Family Medicine

## 2017-12-19 DIAGNOSIS — Z0000 Encounter for general adult medical examination without abnormal findings: Secondary | ICD-10-CM

## 2017-12-19 DIAGNOSIS — D649 Anemia, unspecified: Secondary | ICD-10-CM | POA: Insufficient documentation

## 2017-12-19 NOTE — Telephone Encounter (Signed)
-----   Message from Eustace Pen, LPN sent at 7/86/7544  4:46 PM EDT ----- Regarding: Labs 3/26 Lab orders needed. Thank you.  Insurance:  [employer]

## 2017-12-20 ENCOUNTER — Other Ambulatory Visit: Payer: Self-pay | Admitting: Family Medicine

## 2017-12-20 ENCOUNTER — Encounter: Payer: Self-pay | Admitting: Family Medicine

## 2017-12-20 ENCOUNTER — Ambulatory Visit (INDEPENDENT_AMBULATORY_CARE_PROVIDER_SITE_OTHER): Payer: PPO | Admitting: Family Medicine

## 2017-12-20 ENCOUNTER — Ambulatory Visit (INDEPENDENT_AMBULATORY_CARE_PROVIDER_SITE_OTHER): Payer: PPO

## 2017-12-20 VITALS — BP 110/76 | HR 62 | Temp 97.8°F | Ht 64.5 in | Wt 163.5 lb

## 2017-12-20 DIAGNOSIS — R739 Hyperglycemia, unspecified: Secondary | ICD-10-CM | POA: Insufficient documentation

## 2017-12-20 DIAGNOSIS — D649 Anemia, unspecified: Secondary | ICD-10-CM

## 2017-12-20 DIAGNOSIS — E039 Hypothyroidism, unspecified: Secondary | ICD-10-CM | POA: Insufficient documentation

## 2017-12-20 DIAGNOSIS — E2839 Other primary ovarian failure: Secondary | ICD-10-CM | POA: Diagnosis not present

## 2017-12-20 DIAGNOSIS — Z1231 Encounter for screening mammogram for malignant neoplasm of breast: Secondary | ICD-10-CM

## 2017-12-20 DIAGNOSIS — Z0000 Encounter for general adult medical examination without abnormal findings: Secondary | ICD-10-CM | POA: Diagnosis not present

## 2017-12-20 LAB — CBC WITH DIFFERENTIAL/PLATELET
Basophils Absolute: 0.1 10*3/uL (ref 0.0–0.1)
Basophils Relative: 1.1 % (ref 0.0–3.0)
EOS PCT: 1.7 % (ref 0.0–5.0)
Eosinophils Absolute: 0.1 10*3/uL (ref 0.0–0.7)
HCT: 32.6 % — ABNORMAL LOW (ref 36.0–46.0)
Hemoglobin: 11 g/dL — ABNORMAL LOW (ref 12.0–15.0)
LYMPHS ABS: 2 10*3/uL (ref 0.7–4.0)
Lymphocytes Relative: 26.8 % (ref 12.0–46.0)
MCHC: 33.8 g/dL (ref 30.0–36.0)
MCV: 96.5 fl (ref 78.0–100.0)
MONOS PCT: 9 % (ref 3.0–12.0)
Monocytes Absolute: 0.7 10*3/uL (ref 0.1–1.0)
NEUTROS ABS: 4.6 10*3/uL (ref 1.4–7.7)
NEUTROS PCT: 61.4 % (ref 43.0–77.0)
PLATELETS: 290 10*3/uL (ref 150.0–400.0)
RBC: 3.38 Mil/uL — AB (ref 3.87–5.11)
RDW: 17 % — ABNORMAL HIGH (ref 11.5–15.5)
WBC: 7.4 10*3/uL (ref 4.0–10.5)

## 2017-12-20 LAB — COMPREHENSIVE METABOLIC PANEL
ALT: 16 U/L (ref 0–35)
AST: 18 U/L (ref 0–37)
Albumin: 4.1 g/dL (ref 3.5–5.2)
Alkaline Phosphatase: 47 U/L (ref 39–117)
BUN: 16 mg/dL (ref 6–23)
CO2: 29 meq/L (ref 19–32)
Calcium: 9.4 mg/dL (ref 8.4–10.5)
Chloride: 104 mEq/L (ref 96–112)
Creatinine, Ser: 0.83 mg/dL (ref 0.40–1.20)
GFR: 72.75 mL/min (ref >60.00)
GLUCOSE: 106 mg/dL — AB (ref 70–99)
POTASSIUM: 4 meq/L (ref 3.5–5.1)
Sodium: 138 mEq/L (ref 135–145)
Total Bilirubin: 0.8 mg/dL (ref 0.2–1.2)
Total Protein: 7.8 g/dL (ref 6.0–8.3)

## 2017-12-20 LAB — TSH: TSH: 5.14 u[IU]/mL — AB (ref 0.35–4.50)

## 2017-12-20 LAB — LIPID PANEL
Cholesterol: 161 mg/dL (ref 0–200)
HDL: 58.8 mg/dL (ref >39.00)
LDL CALC: 89 mg/dL (ref 0–99)
NONHDL: 102.09
Total CHOL/HDL Ratio: 3
Triglycerides: 66 mg/dL (ref 0.0–149.0)
VLDL: 13.2 mg/dL (ref 0.0–40.0)

## 2017-12-20 LAB — FERRITIN: FERRITIN: 348.5 ng/mL — AB (ref 10.0–291.0)

## 2017-12-20 NOTE — Progress Notes (Signed)
Subjective:    Patient ID: Carla [name], female    DOB: 01/22/1951, 67 y.o.   MRN: [id number]  HPI Here for health maintenance exam and to review chronic medical problems   Feeling ok overall  Little more tired than usual   Had amw this am -no concerns  Also labs-pending results- incl ferritin  She is exercising - class three times per week and walking in between  Also never sits down  She sleeps well too    Wt Readings from Last 3 Encounters:  12/20/17 163 lb 8 oz (74.2 kg)  12/20/17 163 lb 8 oz (74.2 kg)  12/14/16 160 lb 8 oz (72.8 kg)  wt is up 3 lb  27.63 kg/m  Eats healthy  Fruit and veg or high cholesterol foods   Mammogram 5/18-she will schedule it  Self breast exam -no lumps   Nl pap 1/16   Colonoscopy 11/16 nl with 10 y recall Dr Cristina Gong   dexa 4/17-BMD in the normal range  Mother had OP  She wants to do a 2 y f/u   imms utd  zostavax 2013 Abboud be interested in shingrix    Some anemia in the past Lab Results  Component Value Date   WBC 7.9 12/14/2016   HGB 11.8 (L) 12/14/2016   HCT 34.8 (L) 12/14/2016   MCV 94.5 12/14/2016   PLT 288.0 12/14/2016    Pending cbc and ferritin today   Patient Active Problem List   Diagnosis Date Noted  . Mild anemia 12/19/2017  . Need for hepatitis C screening test 11/25/2016  . Initial Medicare annual wellness visit 10/10/2015  . Estrogen deficiency 10/10/2015  . Encounter for routine gynecological examination 10/08/2014  . Other screening mammogram 07/05/2012  . Routine gynecological examination 06/22/2011  . Routine general medical examination at a health care facility 06/15/2011  . ALLERGIC RHINITIS 06/26/2008   Past Medical History:  Diagnosis Date  . Allergic rhinitis, cause unspecified   . Anemia, unspecified   . Carpal tunnel syndrome   . Cystitis, unspecified   . Family history of osteoporosis   . PONV (postoperative nausea and vomiting)   . Postnasal drip   . Syncope and collapse    Past  Surgical History:  Procedure Laterality Date  . CARPAL TUNNEL RELEASE    . COLONOSCOPY  2005  . COLONOSCOPY WITH PROPOFOL N/A 07/31/2015   Procedure: COLONOSCOPY WITH PROPOFOL;  Surgeon: Ronald Lobo, MD;  Location: [facility];  Service: Endoscopy;  Laterality: N/A;  . DIAGNOSTIC LAPAROSCOPY     dx lack of pregnancy   . EYE SURGERY     lasik, cataract, prk,yag procedure   Social History   Tobacco Use  . Smoking status: Never Smoker  . Smokeless tobacco: Never Used  Substance Use Topics  . Alcohol use: Yes    Alcohol/week: 0.0 oz    Comment: Rare  . Drug use: No   Family History  Problem Relation Age of Onset  . Osteoporosis Mother   . Stroke Mother   . Coronary artery disease Father   . Stroke Father 104  . Diabetes Unknown        Aunts and uncles  . Breast cancer Paternal Aunt   . Breast cancer Maternal Aunt    Allergies  Allergen Reactions  . Codeine Nausea Only   Current Outpatient Medications on File Prior to Visit  Medication Sig Dispense Refill  . Calcium Carb-Cholecalciferol (CALCIUM 600+D3 PO) Take by mouth.    Marland Kitchen  cetirizine (ZYRTEC) 10 MG tablet Take 10 mg by mouth daily as needed for allergies.      No current facility-administered medications on file prior to visit.     Review of Systems  Constitutional: Negative for activity change, appetite change, fatigue, fever and unexpected weight change.  HENT: Negative for congestion, ear pain, rhinorrhea, sinus pressure and sore throat.   Eyes: Negative for pain, redness and visual disturbance.  Respiratory: Negative for cough, shortness of breath and wheezing.   Cardiovascular: Negative for chest pain and palpitations.  Gastrointestinal: Negative for abdominal pain, blood in stool, constipation and diarrhea.  Endocrine: Negative for polydipsia and polyuria.  Genitourinary: Negative for dysuria, frequency and urgency.  Musculoskeletal: Negative for arthralgias, back pain and myalgias.  Skin: Negative for  pallor and rash.  Allergic/Immunologic: Negative for environmental allergies.  Neurological: Negative for dizziness, syncope and headaches.  Hematological: Negative for adenopathy. Does not bruise/bleed easily.  Psychiatric/Behavioral: Negative for decreased concentration and dysphoric mood. The patient is not nervous/anxious.        Objective:   Physical Exam  Constitutional: She appears well-developed and well-nourished. No distress.  Well appearing   HENT:  Head: Normocephalic and atraumatic.  Right Ear: External ear normal.  Left Ear: External ear normal.  Mouth/Throat: Oropharynx is clear and moist.  Eyes: Pupils are equal, round, and reactive to light. Conjunctivae and EOM are normal. No scleral icterus.  Neck: Normal range of motion. Neck supple. No JVD present. Carotid bruit is not present. No thyromegaly present.  Cardiovascular: Normal rate, regular rhythm, normal heart sounds and intact distal pulses. Exam reveals no gallop.  Pulmonary/Chest: Effort normal and breath sounds normal. No respiratory distress. She has no wheezes. She exhibits no tenderness. No breast swelling, tenderness, discharge or bleeding.  Abdominal: Soft. Bowel sounds are normal. She exhibits no distension, no abdominal bruit and no mass. There is no tenderness.  Genitourinary: No breast swelling, tenderness, discharge or bleeding.  Genitourinary Comments: Breast exam: No mass, nodules, thickening, tenderness, bulging, retraction, inflamation, nipple discharge or skin changes noted.  No axillary or clavicular LA.      Musculoskeletal: Normal range of motion. She exhibits no edema or tenderness.  Lymphadenopathy:    She has no cervical adenopathy.  Neurological: She is alert. She has normal reflexes. No cranial nerve deficit. She exhibits normal muscle tone. Coordination normal.  Skin: Skin is warm and dry. No rash noted. No erythema. No pallor.  Solar lentigines diffusely Some SKs  Psychiatric: She has a  normal mood and affect.          Assessment & Plan:   Problem List Items Addressed This Visit      Other   Estrogen deficiency    Ref for dexa       Relevant Orders   DG Bone Density   Mild anemia    Cbc with ferritin today  More tired than usual Chronic  Nl colonoscopy 11/16      Routine general medical examination at a health care facility - Primary    Reviewed health habits including diet and exercise and skin cancer prevention Reviewed appropriate screening tests for age  Also reviewed health mt list, fam hx and immunization status , as well as social and family history   See HPI Rev amw  Pt will schedule mammogram  Ref cone for dexa  Considering shingrix if affordable  Enc healthy diet and exercise

## 2017-12-20 NOTE — Assessment & Plan Note (Signed)
Reviewed health habits including diet and exercise and skin cancer prevention Reviewed appropriate screening tests for age  Also reviewed health mt list, fam hx and immunization status , as well as social and family history   See HPI Rev amw  Pt will schedule mammogram  Ref cone for dexa  Considering shingrix if affordable  Enc healthy diet and exercise

## 2017-12-20 NOTE — Assessment & Plan Note (Signed)
Cbc with ferritin today  More tired than usual Chronic  Nl colonoscopy 11/16

## 2017-12-20 NOTE — Patient Instructions (Addendum)
Ms. Kivi , Thank you for taking time to come for your Medicare Wellness Visit. I appreciate your ongoing commitment to your health goals. Please review the following plan we discussed and let me know if I can assist you in the future.   These are the goals we discussed: Goals    . Increase physical activity     Starting 12/20/2017, I will continue to exercise for at least 60 minutes 3 days per week.        This is a list of the screening recommended for you and due dates:  Health Maintenance  Topic Date Due  . Mammogram  01/25/2018  . Tetanus Vaccine  06/21/2021  . Colon Cancer Screening  07/30/2025  . Flu Shot  Completed  . DEXA scan (bone density measurement)  Completed  .  Hepatitis C: One time screening is recommended by Center for Disease Control  (CDC) for  adults born from [birth year] through 1966.   Completed  . Pneumonia vaccines  Completed   Preventive Care for Adults  A healthy lifestyle and preventive care can promote health and wellness. Preventive health guidelines for adults include the following key practices.  . A routine yearly physical is a good way to check with your health care provider about your health and preventive screening. It is a chance to share any concerns and updates on your health and to receive a thorough exam.  . Visit your dentist for a routine exam and preventive care every 6 months. Brush your teeth twice a day and floss once a day. Good oral hygiene prevents tooth decay and gum disease.  . The frequency of eye exams is based on your age, health, family medical history, use  of contact lenses, and other factors. Follow your health care provider's recommendations for frequency of eye exams.  . Eat a healthy diet. Foods like vegetables, fruits, whole grains, low-fat dairy products, and lean protein foods contain the nutrients you need without too many calories. Decrease your intake of foods high in solid fats, added sugars, and salt. Eat the right amount  of calories for you. Get information about a proper diet from your health care provider, if necessary.  . Regular physical exercise is one of the most important things you can do for your health. Most adults should get at least 150 minutes of moderate-intensity exercise (any activity that increases your heart rate and causes you to sweat) each week. In addition, most adults need muscle-strengthening exercises on 2 or more days a week.  Silver Sneakers [place] be a benefit available to you. To determine eligibility, you [place] visit the website: www.silversneakers.com or contact program at 484-031-4744 Mon-Fri between 8AM-8PM.   . Maintain a healthy weight. The body mass index (BMI) is a screening tool to identify possible weight problems. It provides an estimate of body fat based on height and weight. Your health care provider can find your BMI and can help you achieve or maintain a healthy weight.   For adults 20 years and older: ? A BMI below 18.5 is considered underweight. ? A BMI of 18.5 to 24.9 is normal. ? A BMI of 25 to 29.9 is considered overweight. ? A BMI of 30 and above is considered obese.   . Maintain normal blood lipids and cholesterol levels by exercising and minimizing your intake of saturated fat. Eat a balanced diet with plenty of fruit and vegetables. Blood tests for lipids and cholesterol should begin at age 22 and be repeated every  5 years. If your lipid or cholesterol levels are high, you are over 50, or you are at high risk for heart disease, you [place] need your cholesterol levels checked more frequently. Ongoing high lipid and cholesterol levels should be treated with medicines if diet and exercise are not working.  . If you smoke, find out from your health care provider how to quit. If you do not use tobacco, please do not start.  . If you choose to drink alcohol, please do not consume more than 2 drinks per day. One drink is considered to be 12 ounces (355 mL) of beer, 5 ounces  (148 mL) of wine, or 1.5 ounces (44 mL) of liquor.  . If you are 14-3 years old, ask your health care provider if you should take aspirin to prevent strokes.  . Use sunscreen. Apply sunscreen liberally and repeatedly throughout the day. You should seek shade when your shadow is shorter than you. Protect yourself by wearing long sleeves, pants, a wide-brimmed hat, and sunglasses year round, whenever you are outdoors.  . Once a month, do a whole body skin exam, using a mirror to look at the skin on your back. Tell your health care provider of new moles, moles that have irregular borders, moles that are larger than a pencil eraser, or moles that have changed in shape or color.

## 2017-12-20 NOTE — Assessment & Plan Note (Signed)
Ref for dexa 

## 2017-12-20 NOTE — Patient Instructions (Addendum)
We will schedule bone density test for [place]  You can schedule your mammogram   Try to get 1200-1500 mg of calcium per day with at least 1000 iu of vitamin D - for bone health   If you are interested in the new shingles vaccine (Shingrix) - call your local pharmacy to check on coverage and availability  Get on a waiting list if you are interested   Keep exercising and take care of yourself

## 2017-12-21 NOTE — Progress Notes (Signed)
PCP notes:   Health maintenance:  No gaps identified.  Abnormal screenings:   None  Patient concerns:   None  Nurse concerns:  None  Next PCP appt:   02/19/2018 @ 0930  I reviewed health advisor's note, was available for consultation, and agree with documentation and plan. Loura Pardon MD

## 2017-12-21 NOTE — Progress Notes (Signed)
Subjective:   Carla [name] is a 67 y.o. female who presents for Medicare Annual (Subsequent) preventive examination.  Review of Systems:  N/A Cardiac Risk Factors include: advanced age (>16men, >7 women)     Objective:     Vitals: BP 110/76 (BP Location: Left Arm, Patient Position: Sitting, Cuff Size: Normal)   Pulse 62   Temp 97.8 F (36.6 C) (Oral)   Ht 5' 4.5" (1.638 m) Comment: no shoes  Wt 163 lb 8 oz (74.2 kg)   SpO2 96%   BMI 27.63 kg/m   Body mass index is 27.63 kg/m.  Advanced Directives 12/20/2017  Does Patient Have a Medical Advance Directive? Yes  Type of Paramedic of [place];Living will  Copy of [place] in Chart? No - copy requested    Tobacco Social History   Tobacco Use  Smoking Status Never Smoker  Smokeless Tobacco Never Used     Counseling given: No   Clinical Intake:  Pre-visit preparation completed: Yes  Pain : No/denies pain Pain Score: 0-No pain     Nutritional Status: BMI 25 -29 Overweight Nutritional Risks: None Diabetes: No  How often do you need to have someone help you when you read instructions, pamphlets, or other written materials from your doctor or pharmacy?: 1 - Never What is the last grade level you completed in school?: 12th grade  Interpreter Needed?: No  Comments: pt lives with spouse Information entered by :: LPinson, LPN  Past Medical History:  Diagnosis Date  . Allergic rhinitis, cause unspecified   . Anemia, unspecified   . Carpal tunnel syndrome   . Cystitis, unspecified   . Family history of osteoporosis   . PONV (postoperative nausea and vomiting)   . Postnasal drip   . Syncope and collapse    Past Surgical History:  Procedure Laterality Date  . CARPAL TUNNEL RELEASE    . COLONOSCOPY  2005  . COLONOSCOPY WITH PROPOFOL N/A 07/31/2015   Procedure: COLONOSCOPY WITH PROPOFOL;  Surgeon: Ronald Lobo, MD;  Location: [facility];  Service: Endoscopy;   Laterality: N/A;  . DIAGNOSTIC LAPAROSCOPY     dx lack of pregnancy   . EYE SURGERY     lasik, cataract, prk,yag procedure   Family History  Problem Relation Age of Onset  . Osteoporosis Mother   . Stroke Mother   . Coronary artery disease Father   . Stroke Father 43  . Diabetes Unknown        Aunts and uncles  . Breast cancer Paternal Aunt   . Breast cancer Maternal Aunt    Social History   Socioeconomic History  . Marital status: Married    Spouse name: Not on file  . Number of children: Not on file  . Years of education: Not on file  . Highest education level: Not on file  Occupational History  . Not on file  Social Needs  . Financial resource strain: Not on file  . Food insecurity:    Worry: Not on file    Inability: Not on file  . Transportation needs:    Medical: Not on file    Non-medical: Not on file  Tobacco Use  . Smoking status: Never Smoker  . Smokeless tobacco: Never Used  Substance and Sexual Activity  . Alcohol use: Yes    Alcohol/week: 0.0 oz    Comment: Rare  . Drug use: No  . Sexual activity: Not on file  Lifestyle  . Physical  activity:    Days per week: Not on file    Minutes per session: Not on file  . Stress: Not on file  Relationships  . Social connections:    Talks on phone: Not on file    Gets together: Not on file    Attends religious service: Not on file    Active member of club or organization: Not on file    Attends meetings of clubs or organizations: Not on file    Relationship status: Not on file  Other Topics Concern  . Not on file  Social History Narrative   No regular exercise      Drinks lots of Pepsi          Outpatient Encounter Medications as of 12/20/2017  Medication Sig  . Calcium Carb-Cholecalciferol (CALCIUM 600+D3 PO) Take by mouth.  . cetirizine (ZYRTEC) 10 MG tablet Take 10 mg by mouth daily as needed for allergies.    No facility-administered encounter medications on file as of 12/20/2017.      Activities of Daily Living In your present state of health, do you have any difficulty performing the following activities: 12/20/2017  Hearing? N  Vision? N  Difficulty concentrating or making decisions? N  Walking or climbing stairs? N  Dressing or bathing? N  Doing errands, shopping? N  Preparing Food and eating ? N  Using the Toilet? N  In the past six months, have you accidently leaked urine? N  Do you have problems with loss of bowel control? N  Managing your Medications? N  Managing your Finances? N  Housekeeping or managing your Housekeeping? N  Some recent data might be hidden    Patient Care Team: [name], [name], MD as PCP - General    Assessment:   This is a routine wellness examination for [name].  Exercise Activities and Dietary recommendations Current Exercise Habits: Structured exercise class, Type of exercise: Other - see comments(cardio), Time (Minutes): 60, Frequency (Times/Week): 3, Weekly Exercise (Minutes/Week): 180, Intensity: Moderate, Exercise limited by: None identified  Goals    . Increase physical activity     Starting 12/20/2017, I will continue to exercise for at least 60 minutes 3 days per week.        Fall Risk Fall Risk  12/20/2017 12/14/2016 10/10/2015  Falls in the past year? No No No   Depression Screen PHQ 2/9 Scores 12/20/2017 12/14/2016 10/10/2015  PHQ - 2 Score 0 0 0  PHQ- 9 Score 0 - -     Cognitive Function MMSE - Mini Mental State Exam 12/20/2017  Orientation to time 5  Orientation to Place 5  Registration 3  Attention/ Calculation 0  Recall 3  Language- name 2 objects 0  Language- repeat 1  Language- follow 3 step command 3  Language- read & follow direction 0  Write a sentence 0  Copy design 0  Total score 20     PLEASE NOTE: A Mini-Cog screen was completed. Maximum score is 20. A value of 0 denotes this part of Folstein MMSE was not completed or the patient failed this part of the Mini-Cog screening.   Mini-Cog  Screening Orientation to Time - Max 5 pts Orientation to Place - Max 5 pts Registration - Max 3 pts Recall - Max 3 pts Language Repeat - Max 1 pts Language Follow 3 Step Command - Max 3 pts     Immunization History  Administered Date(s) Administered  . Influenza Split 06/22/2011  . Influenza Whole 06/26/2008,  07/15/2009, 08/17/2012  . Influenza, High Dose Seasonal PF 07/13/2015, 06/27/2017  . Influenza,inj,Quad PF,6+ Mos 07/18/2013  . Influenza-Unspecified 07/11/2014, 07/09/2016  . Pneumococcal Conjugate-13 10/10/2015  . Pneumococcal Polysaccharide-23 12/14/2016  . Tdap 06/22/2011  . Zoster 07/05/2012   Screening Tests Health Maintenance  Topic Date Due  . MAMMOGRAM  01/25/2018  . TETANUS/TDAP  06/21/2021  . COLONOSCOPY  07/30/2025  . INFLUENZA VACCINE  Completed  . DEXA SCAN  Completed  . Hepatitis C Screening  Completed  . PNA vac Low Risk Adult  Completed       Plan:     I have personally reviewed, addressed, and noted the following in the patient's chart:  A. Medical and social history B. Use of alcohol, tobacco or illicit drugs  C. Current medications and supplements D. Functional ability and status E.  Nutritional status F.  Physical activity G. Advance directives H. List of other physicians I.  Hospitalizations, surgeries, and ER visits in previous 12 months J.  [place] to include hearing, vision, cognitive, depression L. Referrals and appointments - none  In addition, I have reviewed and discussed with patient certain preventive protocols, quality metrics, and best practice recommendations. A written personalized care plan for preventive services as well as general preventive health recommendations were provided to patient.  See attached scanned questionnaire for additional information.   Signed,   Lindell Noe, MHA, BS, LPN Health Coach

## 2017-12-22 ENCOUNTER — Encounter: Payer: Self-pay | Admitting: Family Medicine

## 2017-12-22 ENCOUNTER — Telehealth: Payer: Self-pay | Admitting: *Deleted

## 2017-12-22 DIAGNOSIS — R7989 Other specified abnormal findings of blood chemistry: Secondary | ICD-10-CM | POA: Insufficient documentation

## 2017-12-22 MED ORDER — LEVOTHYROXINE SODIUM 25 MCG PO TABS: 25.0000 ug | ORAL_TABLET | Freq: Every day | ORAL | 5 refills | Status: DC

## 2017-12-22 NOTE — Telephone Encounter (Signed)
-----   Message from Abner Greenspan, MD sent at 12/20/2017  6:30 PM EDT ----- Blood count is down a bit (mild) but iron stores are actually high  Is she taking any iron?  TSH is slt elevated again - mildly hypothyroid Please send in levothyroxine 25 mcg 1 po qd (first thing in am 30 min before food or other medicines)- re check TSH in 6 weeks please  Fasting glucose is 106- high normal (watch sugar in diet) -please check A1C in 6 wk for hyperglycemia as well

## 2017-12-29 ENCOUNTER — Encounter: Admit: 2017-12-29 | Discharge: 2017-12-29 | Payer: MEDICARE | Primary: Family

## 2017-12-29 DIAGNOSIS — C50919 Malignant neoplasm of unspecified site of unspecified female breast: ICD-10-CM

## 2017-12-29 DIAGNOSIS — M1990 Unspecified osteoarthritis, unspecified site: ICD-10-CM

## 2017-12-29 DIAGNOSIS — E669 Obesity, unspecified: ICD-10-CM

## 2017-12-29 DIAGNOSIS — I10 Essential (primary) hypertension: Principal | ICD-10-CM

## 2017-12-29 DIAGNOSIS — I471 Supraventricular tachycardia: ICD-10-CM

## 2017-12-29 DIAGNOSIS — R002 Palpitations: ICD-10-CM

## 2017-12-29 DIAGNOSIS — H3530 Unspecified macular degeneration: ICD-10-CM

## 2017-12-29 DIAGNOSIS — C439 Malignant melanoma of skin, unspecified: ICD-10-CM

## 2018-01-17 DIAGNOSIS — Z961 Presence of intraocular lens: Secondary | ICD-10-CM | POA: Diagnosis not present

## 2018-01-31 ENCOUNTER — Ambulatory Visit
Admission: RE | Admit: 2018-01-31 | Discharge: 2018-01-31 | Disposition: A | Discharge status: Home / Self Care | Payer: PPO | Source: Ambulatory Visit | Attending: Family Medicine | Admitting: Family Medicine

## 2018-01-31 DIAGNOSIS — Z8262 Family history of osteoporosis: Secondary | ICD-10-CM | POA: Insufficient documentation

## 2018-01-31 DIAGNOSIS — Z1231 Encounter for screening mammogram for malignant neoplasm of breast: Secondary | ICD-10-CM | POA: Diagnosis not present

## 2018-01-31 DIAGNOSIS — E2839 Other primary ovarian failure: Secondary | ICD-10-CM | POA: Insufficient documentation

## 2018-01-31 DIAGNOSIS — Z13820 Encounter for screening for osteoporosis: Secondary | ICD-10-CM | POA: Insufficient documentation

## 2018-02-02 ENCOUNTER — Other Ambulatory Visit (INDEPENDENT_AMBULATORY_CARE_PROVIDER_SITE_OTHER): Payer: PPO

## 2018-02-02 DIAGNOSIS — E039 Hypothyroidism, unspecified: Secondary | ICD-10-CM | POA: Diagnosis not present

## 2018-02-02 DIAGNOSIS — R739 Hyperglycemia, unspecified: Secondary | ICD-10-CM | POA: Diagnosis not present

## 2018-02-02 DIAGNOSIS — R7989 Other specified abnormal findings of blood chemistry: Secondary | ICD-10-CM | POA: Diagnosis not present

## 2018-02-02 LAB — FERRITIN: Ferritin: 397.8 ng/mL — ABNORMAL HIGH (ref 10.0–291.0)

## 2018-02-02 LAB — HEMOGLOBIN A1C: Hgb A1c MFr Bld: 5.8 % (ref 4.6–6.5)

## 2018-02-02 LAB — TSH: TSH: 2.52 u[IU]/mL (ref 0.35–4.50)

## 2018-03-09 ENCOUNTER — Encounter: Admit: 2018-03-09 | Discharge: 2018-03-09 | Payer: MEDICARE | Primary: Family

## 2018-03-09 DIAGNOSIS — C439 Malignant melanoma of skin, unspecified: ICD-10-CM

## 2018-03-09 DIAGNOSIS — H3530 Unspecified macular degeneration: ICD-10-CM

## 2018-03-09 DIAGNOSIS — C4362 Malignant melanoma of left upper limb, including shoulder: Principal | ICD-10-CM

## 2018-03-09 DIAGNOSIS — K4090 Unilateral inguinal hernia, without obstruction or gangrene, not specified as recurrent: ICD-10-CM

## 2018-03-09 DIAGNOSIS — R002 Palpitations: ICD-10-CM

## 2018-03-09 DIAGNOSIS — M1990 Unspecified osteoarthritis, unspecified site: ICD-10-CM

## 2018-03-09 DIAGNOSIS — E669 Obesity, unspecified: ICD-10-CM

## 2018-03-09 DIAGNOSIS — I10 Essential (primary) hypertension: Principal | ICD-10-CM

## 2018-03-09 DIAGNOSIS — C50919 Malignant neoplasm of unspecified site of unspecified female breast: ICD-10-CM

## 2018-03-09 DIAGNOSIS — I471 Supraventricular tachycardia: ICD-10-CM

## 2018-03-09 LAB — LDH-LACTATE DEHYDROGENASE: Lab: 173 U/L (ref 100–210)

## 2018-03-12 ENCOUNTER — Encounter: Admit: 2018-03-12 | Discharge: 2018-03-12 | Payer: MEDICARE | Primary: Family

## 2018-03-12 DIAGNOSIS — H3530 Unspecified macular degeneration: ICD-10-CM

## 2018-03-12 DIAGNOSIS — I10 Essential (primary) hypertension: Principal | ICD-10-CM

## 2018-03-12 DIAGNOSIS — C50919 Malignant neoplasm of unspecified site of unspecified female breast: ICD-10-CM

## 2018-03-12 DIAGNOSIS — I471 Supraventricular tachycardia: ICD-10-CM

## 2018-03-12 DIAGNOSIS — E669 Obesity, unspecified: ICD-10-CM

## 2018-03-12 DIAGNOSIS — R002 Palpitations: ICD-10-CM

## 2018-03-12 DIAGNOSIS — C439 Malignant melanoma of skin, unspecified: ICD-10-CM

## 2018-03-12 DIAGNOSIS — M1990 Unspecified osteoarthritis, unspecified site: ICD-10-CM

## 2018-04-25 ENCOUNTER — Encounter: Admit: 2018-04-25 | Discharge: 2018-04-25 | Payer: MEDICARE | Primary: Family

## 2018-04-25 ENCOUNTER — Ambulatory Visit: Admit: 2018-04-25 | Discharge: 2018-04-26 | Payer: MEDICARE | Primary: Family

## 2018-04-25 DIAGNOSIS — K4090 Unilateral inguinal hernia, without obstruction or gangrene, not specified as recurrent: Principal | ICD-10-CM

## 2018-04-25 DIAGNOSIS — E669 Obesity, unspecified: ICD-10-CM

## 2018-04-25 DIAGNOSIS — C50919 Malignant neoplasm of unspecified site of unspecified female breast: ICD-10-CM

## 2018-04-25 DIAGNOSIS — I471 Supraventricular tachycardia: ICD-10-CM

## 2018-04-25 DIAGNOSIS — C439 Malignant melanoma of skin, unspecified: ICD-10-CM

## 2018-04-25 DIAGNOSIS — M1990 Unspecified osteoarthritis, unspecified site: ICD-10-CM

## 2018-04-25 DIAGNOSIS — R002 Palpitations: ICD-10-CM

## 2018-04-25 DIAGNOSIS — I10 Essential (primary) hypertension: Principal | ICD-10-CM

## 2018-04-25 DIAGNOSIS — H3530 Unspecified macular degeneration: ICD-10-CM

## 2018-04-25 MED ORDER — CEFAZOLIN INJ 1GM IVP
2 g | Freq: Once | INTRAVENOUS | 0 refills | Status: CN
Start: 2018-04-25 — End: ?

## 2018-04-26 ENCOUNTER — Encounter: Admit: 2018-04-26 | Discharge: 2018-04-26 | Payer: MEDICARE | Primary: Family

## 2018-04-26 DIAGNOSIS — R002 Palpitations: ICD-10-CM

## 2018-04-26 DIAGNOSIS — M1990 Unspecified osteoarthritis, unspecified site: ICD-10-CM

## 2018-04-26 DIAGNOSIS — C50919 Malignant neoplasm of unspecified site of unspecified female breast: ICD-10-CM

## 2018-04-26 DIAGNOSIS — E669 Obesity, unspecified: ICD-10-CM

## 2018-04-26 DIAGNOSIS — K4090 Unilateral inguinal hernia, without obstruction or gangrene, not specified as recurrent: Principal | ICD-10-CM

## 2018-04-26 DIAGNOSIS — H3530 Unspecified macular degeneration: ICD-10-CM

## 2018-04-26 DIAGNOSIS — I471 Supraventricular tachycardia: ICD-10-CM

## 2018-04-26 DIAGNOSIS — C439 Malignant melanoma of skin, unspecified: ICD-10-CM

## 2018-04-26 DIAGNOSIS — I10 Essential (primary) hypertension: Principal | ICD-10-CM

## 2018-05-09 ENCOUNTER — Ambulatory Visit: Admit: 2018-05-09 | Discharge: 2018-05-09 | Payer: MEDICARE | Primary: Family

## 2018-05-09 ENCOUNTER — Encounter: Admit: 2018-05-09 | Discharge: 2018-05-09 | Payer: MEDICARE | Primary: Family

## 2018-05-09 DIAGNOSIS — H3530 Unspecified macular degeneration: ICD-10-CM

## 2018-05-09 DIAGNOSIS — C50919 Malignant neoplasm of unspecified site of unspecified female breast: ICD-10-CM

## 2018-05-09 DIAGNOSIS — R002 Palpitations: ICD-10-CM

## 2018-05-09 DIAGNOSIS — I471 Supraventricular tachycardia: ICD-10-CM

## 2018-05-09 DIAGNOSIS — I10 Essential (primary) hypertension: ICD-10-CM

## 2018-05-09 DIAGNOSIS — Z85820 Personal history of malignant melanoma of skin: ICD-10-CM

## 2018-05-09 DIAGNOSIS — C439 Malignant melanoma of skin, unspecified: ICD-10-CM

## 2018-05-09 DIAGNOSIS — K4090 Unilateral inguinal hernia, without obstruction or gangrene, not specified as recurrent: Principal | ICD-10-CM

## 2018-05-09 DIAGNOSIS — E669 Obesity, unspecified: ICD-10-CM

## 2018-05-09 DIAGNOSIS — M1990 Unspecified osteoarthritis, unspecified site: ICD-10-CM

## 2018-05-09 DIAGNOSIS — Z853 Personal history of malignant neoplasm of breast: ICD-10-CM

## 2018-05-09 MED ORDER — LIDOCAINE (PF) 200 MG/10 ML (2 %) IJ SYRG
0 refills | Status: DC
Start: 2018-05-09 — End: 2018-05-09
  Administered 2018-05-09: 17:00:00 50 mg via INTRAVENOUS

## 2018-05-09 MED ORDER — OXYCODONE-ACETAMINOPHEN 5-325 MG PO TAB
1-2 | ORAL_TABLET | ORAL | 0 refills | 2.00000 days | Status: AC | PRN
Start: 2018-05-09 — End: 2019-03-22
  Filled 2018-05-09 (×2): qty 40, 7d supply, fill #1

## 2018-05-09 MED ORDER — SUGAMMADEX 100 MG/ML IV SOLN
INTRAVENOUS | 0 refills | Status: DC
Start: 2018-05-09 — End: 2018-05-09
  Administered 2018-05-09: 17:00:00 199 mg via INTRAVENOUS

## 2018-05-09 MED ORDER — FENTANYL CITRATE (PF) 50 MCG/ML IJ SOLN
0 refills | Status: DC
Start: 2018-05-09 — End: 2018-05-09
  Administered 2018-05-09: 17:00:00 100 ug via INTRAVENOUS

## 2018-05-09 MED ORDER — DIPHENHYDRAMINE HCL 50 MG/ML IJ SOLN
25 mg | Freq: Once | INTRAVENOUS | 0 refills | Status: DC | PRN
Start: 2018-05-09 — End: 2018-05-09

## 2018-05-09 MED ORDER — LIDOCAINE-EPINEPHRINE 1 %-1:100,000 IJ SOLN
0 refills | Status: DC
Start: 2018-05-09 — End: 2018-05-09
  Administered 2018-05-09: 17:00:00 8 mL via INTRAMUSCULAR

## 2018-05-09 MED ORDER — LIDOCAINE (PF) 10 MG/ML (1 %) IJ SOLN
.1-2 mL | Freq: Once | INTRAMUSCULAR | 0 refills | Status: DC
Start: 2018-05-09 — End: 2018-05-09

## 2018-05-09 MED ORDER — CEFAZOLIN INJ 1GM IVP
2 g | Freq: Once | INTRAVENOUS | 0 refills | Status: CP
Start: 2018-05-09 — End: ?
  Administered 2018-05-09: 17:00:00 2 g via INTRAVENOUS

## 2018-05-09 MED ORDER — MEPERIDINE (PF) 25 MG/ML IJ SYRG
12.5 mg | INTRAVENOUS | 0 refills | Status: DC | PRN
Start: 2018-05-09 — End: 2018-05-09
  Administered 2018-05-09: 18:00:00 12.5 mg via INTRAVENOUS

## 2018-05-09 MED ORDER — HYDRALAZINE 20 MG/ML IJ SOLN
0 refills | Status: DC
Start: 2018-05-09 — End: 2018-05-09
  Administered 2018-05-09: 17:00:00 10 mg via INTRAVENOUS

## 2018-05-09 MED ORDER — ONDANSETRON HCL (PF) 4 MG/2 ML IJ SOLN
INTRAVENOUS | 0 refills | Status: DC
Start: 2018-05-09 — End: 2018-05-09
  Administered 2018-05-09: 17:00:00 4 mg via INTRAVENOUS

## 2018-05-09 MED ORDER — OXYCODONE 5 MG PO TAB
5-10 mg | Freq: Once | ORAL | 0 refills | Status: CP | PRN
Start: 2018-05-09 — End: ?
  Administered 2018-05-09: 18:00:00 10 mg via ORAL

## 2018-05-09 MED ORDER — MIDAZOLAM 1 MG/ML IJ SOLN
INTRAVENOUS | 0 refills | Status: DC
Start: 2018-05-09 — End: 2018-05-09
  Administered 2018-05-09: 16:00:00 2 mg via INTRAVENOUS

## 2018-05-09 MED ORDER — FENTANYL CITRATE (PF) 50 MCG/ML IJ SOLN
25 ug | INTRAVENOUS | 0 refills | Status: DC | PRN
Start: 2018-05-09 — End: 2018-05-09

## 2018-05-09 MED ORDER — PROPOFOL INJ 10 MG/ML IV VIAL
0 refills | Status: DC
Start: 2018-05-09 — End: 2018-05-09
  Administered 2018-05-09: 17:00:00 40 mg via INTRAVENOUS
  Administered 2018-05-09: 17:00:00 120 mg via INTRAVENOUS
  Administered 2018-05-09: 17:00:00 40 mg via INTRAVENOUS

## 2018-05-09 MED ORDER — FENTANYL CITRATE (PF) 50 MCG/ML IJ SOLN
50 ug | INTRAVENOUS | 0 refills | Status: DC | PRN
Start: 2018-05-09 — End: 2018-05-09
  Administered 2018-05-09 (×2): 50 ug via INTRAVENOUS

## 2018-05-09 MED ORDER — LACTATED RINGERS IV SOLP
INTRAVENOUS | 0 refills | Status: DC
Start: 2018-05-09 — End: 2018-05-09
  Administered 2018-05-09: 14:00:00 1000.000 mL via INTRAVENOUS

## 2018-05-09 MED ORDER — FENTANYL CITRATE (PF) 50 MCG/ML IJ SOLN
50 ug | INTRAVENOUS | 0 refills | Status: DC | PRN
Start: 2018-05-09 — End: 2018-05-09
  Administered 2018-05-09: 18:00:00 50 ug via INTRAVENOUS

## 2018-05-09 MED ORDER — LABETALOL 5 MG/ML IV SYRG
0 refills | Status: DC
Start: 2018-05-09 — End: 2018-05-09
  Administered 2018-05-09: 17:00:00 10 mg via INTRAVENOUS

## 2018-05-09 MED ORDER — ONDANSETRON HCL (PF) 4 MG/2 ML IJ SOLN
4 mg | Freq: Once | INTRAVENOUS | 0 refills | Status: DC | PRN
Start: 2018-05-09 — End: 2018-05-09

## 2018-05-09 MED ORDER — HALOPERIDOL LACTATE 5 MG/ML IJ SOLN
1 mg | Freq: Once | INTRAVENOUS | 0 refills | Status: DC | PRN
Start: 2018-05-09 — End: 2018-05-09

## 2018-05-11 ENCOUNTER — Encounter: Admit: 2018-05-11 | Discharge: 2018-05-11 | Payer: MEDICARE | Primary: Family

## 2018-05-11 DIAGNOSIS — H3530 Unspecified macular degeneration: ICD-10-CM

## 2018-05-11 DIAGNOSIS — E669 Obesity, unspecified: ICD-10-CM

## 2018-05-11 DIAGNOSIS — M1990 Unspecified osteoarthritis, unspecified site: ICD-10-CM

## 2018-05-11 DIAGNOSIS — I471 Supraventricular tachycardia: ICD-10-CM

## 2018-05-11 DIAGNOSIS — C50919 Malignant neoplasm of unspecified site of unspecified female breast: ICD-10-CM

## 2018-05-11 DIAGNOSIS — R002 Palpitations: ICD-10-CM

## 2018-05-11 DIAGNOSIS — C439 Malignant melanoma of skin, unspecified: ICD-10-CM

## 2018-05-11 DIAGNOSIS — I10 Essential (primary) hypertension: Principal | ICD-10-CM

## 2018-05-17 ENCOUNTER — Encounter: Admit: 2018-05-17 | Discharge: 2018-05-17 | Payer: MEDICARE | Primary: Family

## 2018-05-23 ENCOUNTER — Ambulatory Visit: Admit: 2018-05-23 | Discharge: 2018-05-24 | Payer: MEDICARE | Primary: Family

## 2018-05-23 ENCOUNTER — Encounter: Admit: 2018-05-23 | Discharge: 2018-05-23 | Payer: MEDICARE | Primary: Family

## 2018-05-23 DIAGNOSIS — H3530 Unspecified macular degeneration: ICD-10-CM

## 2018-05-23 DIAGNOSIS — E669 Obesity, unspecified: ICD-10-CM

## 2018-05-23 DIAGNOSIS — I471 Supraventricular tachycardia: ICD-10-CM

## 2018-05-23 DIAGNOSIS — C439 Malignant melanoma of skin, unspecified: ICD-10-CM

## 2018-05-23 DIAGNOSIS — C50919 Malignant neoplasm of unspecified site of unspecified female breast: ICD-10-CM

## 2018-05-23 DIAGNOSIS — M1990 Unspecified osteoarthritis, unspecified site: ICD-10-CM

## 2018-05-23 DIAGNOSIS — R002 Palpitations: ICD-10-CM

## 2018-05-23 DIAGNOSIS — I10 Essential (primary) hypertension: Principal | ICD-10-CM

## 2018-05-24 DIAGNOSIS — Z9889 Other specified postprocedural states: Principal | ICD-10-CM

## 2018-06-09 ENCOUNTER — Other Ambulatory Visit: Payer: Self-pay | Admitting: Family Medicine

## 2018-08-15 ENCOUNTER — Encounter: Admit: 2018-08-15 | Discharge: 2018-08-15 | Payer: MEDICARE | Primary: Family

## 2018-09-21 ENCOUNTER — Encounter: Admit: 2018-09-21 | Discharge: 2018-09-21 | Payer: MEDICARE | Primary: Family

## 2018-09-21 DIAGNOSIS — C50919 Malignant neoplasm of unspecified site of unspecified female breast: ICD-10-CM

## 2018-09-21 DIAGNOSIS — H3530 Unspecified macular degeneration: ICD-10-CM

## 2018-09-21 DIAGNOSIS — C4362 Malignant melanoma of left upper limb, including shoulder: Principal | ICD-10-CM

## 2018-09-21 DIAGNOSIS — E669 Obesity, unspecified: ICD-10-CM

## 2018-09-21 DIAGNOSIS — M1990 Unspecified osteoarthritis, unspecified site: ICD-10-CM

## 2018-09-21 DIAGNOSIS — C439 Malignant melanoma of skin, unspecified: ICD-10-CM

## 2018-09-21 DIAGNOSIS — I10 Essential (primary) hypertension: Principal | ICD-10-CM

## 2018-09-21 DIAGNOSIS — I471 Supraventricular tachycardia: ICD-10-CM

## 2018-09-21 DIAGNOSIS — R002 Palpitations: ICD-10-CM

## 2018-09-21 LAB — LDH-LACTATE DEHYDROGENASE: Lab: 177 U/L (ref 100–210)

## 2018-10-10 ENCOUNTER — Other Ambulatory Visit: Payer: Self-pay | Admitting: Family Medicine

## 2018-10-12 DIAGNOSIS — D225 Melanocytic nevi of trunk: Secondary | ICD-10-CM | POA: Diagnosis not present

## 2018-10-12 DIAGNOSIS — D2271 Melanocytic nevi of right lower limb, including hip: Secondary | ICD-10-CM | POA: Diagnosis not present

## 2018-10-12 DIAGNOSIS — D2261 Melanocytic nevi of right upper limb, including shoulder: Secondary | ICD-10-CM | POA: Diagnosis not present

## 2018-10-12 DIAGNOSIS — D2262 Melanocytic nevi of left upper limb, including shoulder: Secondary | ICD-10-CM | POA: Diagnosis not present

## 2018-10-12 DIAGNOSIS — L821 Other seborrheic keratosis: Secondary | ICD-10-CM | POA: Diagnosis not present

## 2018-10-12 DIAGNOSIS — L718 Other rosacea: Secondary | ICD-10-CM | POA: Diagnosis not present

## 2018-10-12 DIAGNOSIS — D2272 Melanocytic nevi of left lower limb, including hip: Secondary | ICD-10-CM | POA: Diagnosis not present

## 2018-11-23 ENCOUNTER — Encounter: Admit: 2018-11-23 | Discharge: 2018-11-23 | Payer: MEDICARE | Primary: Family

## 2019-01-01 ENCOUNTER — Encounter: Payer: PPO | Admitting: Family Medicine

## 2019-01-01 ENCOUNTER — Ambulatory Visit: Payer: PPO

## 2019-01-23 ENCOUNTER — Encounter: Payer: PPO | Admitting: Family Medicine

## 2019-01-23 ENCOUNTER — Ambulatory Visit: Payer: PPO

## 2019-03-01 ENCOUNTER — Telehealth: Payer: Self-pay | Admitting: Family Medicine

## 2019-03-01 DIAGNOSIS — R739 Hyperglycemia, unspecified: Secondary | ICD-10-CM

## 2019-03-01 DIAGNOSIS — D649 Anemia, unspecified: Secondary | ICD-10-CM

## 2019-03-01 DIAGNOSIS — Z0000 Encounter for general adult medical examination without abnormal findings: Secondary | ICD-10-CM

## 2019-03-01 DIAGNOSIS — E039 Hypothyroidism, unspecified: Secondary | ICD-10-CM

## 2019-03-01 DIAGNOSIS — R7989 Other specified abnormal findings of blood chemistry: Secondary | ICD-10-CM

## 2019-03-01 NOTE — Telephone Encounter (Signed)
-----   Message from Cloyd Stagers, RT sent at 02/26/2019 11:13 AM EDT ----- Regarding: Lab Orders for Friday 6.5.2020 Please place lab orders for Friday 6.5.2020, [facility] physical on Thursday 6.11.2020 Thank you, Dyke Maes RT(R)

## 2019-03-02 ENCOUNTER — Other Ambulatory Visit (INDEPENDENT_AMBULATORY_CARE_PROVIDER_SITE_OTHER): Payer: PPO

## 2019-03-02 ENCOUNTER — Ambulatory Visit (INDEPENDENT_AMBULATORY_CARE_PROVIDER_SITE_OTHER): Payer: PPO

## 2019-03-02 DIAGNOSIS — Z1231 Encounter for screening mammogram for malignant neoplasm of breast: Secondary | ICD-10-CM | POA: Diagnosis not present

## 2019-03-02 DIAGNOSIS — R7989 Other specified abnormal findings of blood chemistry: Secondary | ICD-10-CM | POA: Diagnosis not present

## 2019-03-02 DIAGNOSIS — Z0000 Encounter for general adult medical examination without abnormal findings: Secondary | ICD-10-CM | POA: Diagnosis not present

## 2019-03-02 DIAGNOSIS — E039 Hypothyroidism, unspecified: Secondary | ICD-10-CM

## 2019-03-02 DIAGNOSIS — D649 Anemia, unspecified: Secondary | ICD-10-CM | POA: Diagnosis not present

## 2019-03-02 DIAGNOSIS — R739 Hyperglycemia, unspecified: Secondary | ICD-10-CM | POA: Diagnosis not present

## 2019-03-02 LAB — LIPID PANEL
Cholesterol: 159 mg/dL (ref 0–200)
HDL: 55.1 mg/dL (ref >39.00)
LDL Cholesterol: 92 mg/dL (ref 0–99)
NonHDL: 104.17
Total CHOL/HDL Ratio: 3
Triglycerides: 61 mg/dL (ref 0.0–149.0)
VLDL: 12.2 mg/dL (ref 0.0–40.0)

## 2019-03-02 LAB — COMPREHENSIVE METABOLIC PANEL
ALT: 12 U/L (ref 0–35)
AST: 14 U/L (ref 0–37)
Albumin: 4.2 g/dL (ref 3.5–5.2)
Alkaline Phosphatase: 44 U/L (ref 39–117)
BUN: 17 mg/dL (ref 6–23)
CO2: 27 mEq/L (ref 19–32)
Calcium: 9.1 mg/dL (ref 8.4–10.5)
Chloride: 103 mEq/L (ref 96–112)
Creatinine, Ser: 0.82 mg/dL (ref 0.40–1.20)
GFR: 69.17 mL/min (ref >60.00)
Glucose, Bld: 88 mg/dL (ref 70–99)
Potassium: 4.2 mEq/L (ref 3.5–5.1)
Sodium: 138 mEq/L (ref 135–145)
Total Bilirubin: 0.7 mg/dL (ref 0.2–1.2)
Total Protein: 7.4 g/dL (ref 6.0–8.3)

## 2019-03-02 LAB — CBC WITH DIFFERENTIAL/PLATELET
Basophils Absolute: 0.1 10*3/uL (ref 0.0–0.1)
Basophils Relative: 1.1 % (ref 0.0–3.0)
Eosinophils Absolute: 0.1 10*3/uL (ref 0.0–0.7)
Eosinophils Relative: 1.5 % (ref 0.0–5.0)
HCT: 29.8 % — ABNORMAL LOW (ref 36.0–46.0)
Hemoglobin: 10.2 g/dL — ABNORMAL LOW (ref 12.0–15.0)
Lymphocytes Relative: 29.6 % (ref 12.0–46.0)
Lymphs Abs: 2.3 10*3/uL (ref 0.7–4.0)
MCHC: 34.3 g/dL (ref 30.0–36.0)
MCV: 96.8 fl (ref 78.0–100.0)
Monocytes Absolute: 1 10*3/uL (ref 0.1–1.0)
Monocytes Relative: 13.1 % — ABNORMAL HIGH (ref 3.0–12.0)
Neutro Abs: 4.3 10*3/uL (ref 1.4–7.7)
Neutrophils Relative %: 54.7 % (ref 43.0–77.0)
Platelets: 250 10*3/uL (ref 150.0–400.0)
RBC: 3.08 Mil/uL — ABNORMAL LOW (ref 3.87–5.11)
RDW: 21 % — ABNORMAL HIGH (ref 11.5–15.5)
WBC: 7.8 10*3/uL (ref 4.0–10.5)

## 2019-03-02 LAB — FERRITIN: Ferritin: 370.7 ng/mL — ABNORMAL HIGH (ref 10.0–291.0)

## 2019-03-02 LAB — TSH: TSH: 2.45 u[IU]/mL (ref 0.35–4.50)

## 2019-03-02 LAB — HEMOGLOBIN A1C: Hgb A1c MFr Bld: 6.1 % (ref 4.6–6.5)

## 2019-03-02 NOTE — Progress Notes (Signed)
Subjective:   Carla [name] is a 68 y.o. female who presents for Medicare Annual (Subsequent) preventive examination.  Review of Systems:  N/A Cardiac Risk Factors include: advanced age (>24men, >23 women)     Objective:     Vitals: There were no vitals taken for this visit.  There is no height or weight on file to calculate BMI.  Advanced Directives 03/02/2019 12/20/2017  Does Patient Have a Medical Advance Directive? Yes Yes  Type of Paramedic of [place];Living will [place];Living will  Copy of [place] in Chart? No - copy requested No - copy requested    Tobacco Social History   Tobacco Use  Smoking Status Never Smoker  Smokeless Tobacco Never Used     Counseling given: No   Clinical Intake:  Pre-visit preparation completed: Yes  Pain : No/denies pain Pain Score: 0-No pain     Nutritional Status: BMI 25 -29 Overweight Nutritional Risks: None Diabetes: No  How often do you need to have someone help you when you read instructions, pamphlets, or other written materials from your doctor or pharmacy?: 1 - Never What is the last grade level you completed in school?: 12th grade     Comments: pt lives with spouse Information entered by :: LPinson, LPN  Past Medical History:  Diagnosis Date  . Allergic rhinitis, cause unspecified   . Anemia, unspecified   . Carpal tunnel syndrome   . Cystitis, unspecified   . Family history of osteoporosis   . PONV (postoperative nausea and vomiting)   . Postnasal drip   . Syncope and collapse    Past Surgical History:  Procedure Laterality Date  . CARPAL TUNNEL RELEASE    . COLONOSCOPY  2005  . COLONOSCOPY WITH PROPOFOL N/A 07/31/2015   Procedure: COLONOSCOPY WITH PROPOFOL;  Surgeon: Carla Lobo, MD;  Location: [facility];  Service: Endoscopy;  Laterality: N/A;  . DIAGNOSTIC LAPAROSCOPY     dx lack of pregnancy   . EYE SURGERY     lasik, cataract,  prk,yag procedure   Family History  Problem Relation Age of Onset  . Osteoporosis Mother   . Stroke Mother   . Coronary artery disease Father   . Stroke Father 20  . Diabetes Other        Aunts and uncles  . Breast cancer Paternal Aunt   . Breast cancer Maternal Aunt    Social History   Socioeconomic History  . Marital status: Married    Spouse name: Not on file  . Number of children: Not on file  . Years of education: Not on file  . Highest education level: Not on file  Occupational History  . Not on file  Social Needs  . Financial resource strain: Not on file  . Food insecurity:    Worry: Not on file    Inability: Not on file  . Transportation needs:    Medical: Not on file    Non-medical: Not on file  Tobacco Use  . Smoking status: Never Smoker  . Smokeless tobacco: Never Used  Substance and Sexual Activity  . Alcohol use: Yes    Alcohol/week: 0.0 standard drinks    Comment: Rare  . Drug use: No  . Sexual activity: Not Currently  Lifestyle  . Physical activity:    Days per week: Not on file    Minutes per session: Not on file  . Stress: Not on file  Relationships  .  Social connections:    Talks on phone: Not on file    Gets together: Not on file    Attends religious service: Not on file    Active member of club or organization: Not on file    Attends meetings of clubs or organizations: Not on file    Relationship status: Not on file  Other Topics Concern  . Not on file  Social History Narrative   No regular exercise      Drinks lots of Pepsi          Outpatient Encounter Medications as of 03/02/2019  Medication Sig  . Calcium Carb-Cholecalciferol (CALCIUM 600+D3 PO) Take by mouth.  . cetirizine (ZYRTEC) 10 MG tablet Take 10 mg by mouth daily as needed for allergies.   Marland Kitchen levothyroxine (SYNTHROID, LEVOTHROID) 25 MCG tablet TAKE 1 TABLET BY MOUTH ONCE A DAY. TAKE ON AN EMPTY STOMACH WITH A GLASS OF WATER ATLEAST 30-60 MIN BEFORE BREAKFAST   No  facility-administered encounter medications on file as of 03/02/2019.     Activities of Daily Living In your present state of health, do you have any difficulty performing the following activities: 03/02/2019  Hearing? N  Vision? N  Difficulty concentrating or making decisions? N  Walking or climbing stairs? N  Dressing or bathing? N  Doing errands, shopping? N  Preparing Food and eating ? N  Using the Toilet? N  In the past six months, have you accidently leaked urine? N  Do you have problems with loss of bowel control? N  Managing your Medications? N  Managing your Finances? N  Housekeeping or managing your Housekeeping? N  Some recent data might be hidden    Patient Care Team: [name], Carla Fanny, MD as PCP - General    Assessment:   This is a routine wellness examination for [name].  Vision Screening Comments: Vision exam in 2019 with Dr. Sandra [name]  Exercise Activities and Dietary recommendations Current Exercise Habits: The patient does not participate in regular exercise at present, Exercise limited by: None identified  Goals    . Increase physical activity     When schedule permits, I will continue to exercise for at least 60 minutes 3 days per week.        Fall Risk Fall Risk  03/02/2019 12/20/2017 12/14/2016 10/10/2015  Falls in the past year? 0 No No No   Depression Screen PHQ 2/9 Scores 03/02/2019 12/20/2017 12/14/2016 10/10/2015  PHQ - 2 Score 0 0 0 0  PHQ- 9 Score 0 0 - -     Cognitive Function MMSE - Mini Mental State Exam 03/02/2019 12/20/2017  Orientation to time 5 5  Orientation to Place 5 5  Registration 3 3  Attention/ Calculation 0 0  Recall 3 3  Language- name 2 objects 0 0  Language- repeat 1 1  Language- follow 3 step command 0 3  Language- read & follow direction 0 0  Write a sentence 0 0  Copy design 0 0  Total score 17 20       PLEASE NOTE: A Mini-Cog screen was completed. Maximum score is 17. A value of 0 denotes this part of Folstein MMSE was not  completed or the patient failed this part of the Mini-Cog screening.   Mini-Cog Screening Orientation to Time - Max 5 pts Orientation to Place - Max 5 pts Registration - Max 3 pts Recall - Max 3 pts Language Repeat - Max 1 pts    Immunization History  Administered  Date(s) Administered  . Influenza Split 06/22/2011  . Influenza Whole 06/26/2008, 07/15/2009, 08/17/2012  . Influenza, High Dose Seasonal PF 07/13/2015, 06/27/2017  . Influenza,inj,Quad PF,6+ Mos 07/18/2013  . Influenza-Unspecified 07/11/2014, 07/09/2016  . Pneumococcal Conjugate-13 10/10/2015  . Pneumococcal Polysaccharide-23 12/14/2016  . Tdap 06/22/2011  . Zoster 07/05/2012   Screening Tests Health Maintenance  Topic Date Due  . MAMMOGRAM  09/27/2019 (Originally 02/01/2019)  . INFLUENZA VACCINE  04/28/2019  . TETANUS/TDAP  06/21/2021  . COLONOSCOPY  07/30/2025  . DEXA SCAN  Completed  . Hepatitis C Screening  Completed  . PNA vac Low Risk Adult  Completed      Plan:     I have personally reviewed, addressed, and noted the following in the patient's chart:  A. Medical and social history B. Use of alcohol, tobacco or illicit drugs  C. Current medications and supplements D. Functional ability and status E.  Nutritional status F.  Physical activity G. Advance directives H. List of other physicians I.  Hospitalizations, surgeries, and ER visits in previous 12 months J.  Vitals (unless it is a telemedicine encounter) K. Screenings to include cognitive, depression, hearing, vision (NOTE: hearing and vision screenings not completed in telemedicine encounter) L. Referrals and appointments   In addition, I have reviewed and discussed with patient certain preventive protocols, quality metrics, and best practice recommendations. A written personalized care plan for preventive services and recommendations were provided to patient.  With patient's permission, we connected on 03/02/19 at  1:00 PM EDT by a video  enabled telemedicine application. Two patient identifiers were used to ensure the encounter occurred with the correct person.    Patient was in home and writer was in office.   Signed,   Lindell Noe, MHA, BS, LPN Health Coach

## 2019-03-02 NOTE — Patient Instructions (Signed)
Ms. Boschert , Thank you for taking time to come for your Medicare Wellness Visit. I appreciate your ongoing commitment to your health goals. Please review the following plan we discussed and let me know if I can assist you in the future.   These are the goals we discussed: Goals    . Increase physical activity     When schedule permits, I will continue to exercise for at least 60 minutes 3 days per week.        This is a list of the screening recommended for you and due dates:  Health Maintenance  Topic Date Due  . Mammogram  09/27/2019*  . Flu Shot  04/28/2019  . Tetanus Vaccine  06/21/2021  . Colon Cancer Screening  07/30/2025  . DEXA scan (bone density measurement)  Completed  .  Hepatitis C: One time screening is recommended by Center for Disease Control  (CDC) for  adults born from [birth year] through 1966.   Completed  . Pneumonia vaccines  Completed  *Topic was postponed. The date shown is not the original due date.   Preventive Care for Adults  A healthy lifestyle and preventive care can promote health and wellness. Preventive health guidelines for adults include the following key practices.  . A routine yearly physical is a good way to check with your health care provider about your health and preventive screening. It is a chance to share any concerns and updates on your health and to receive a thorough exam.  . Visit your dentist for a routine exam and preventive care every 6 months. Brush your teeth twice a day and floss once a day. Good oral hygiene prevents tooth decay and gum disease.  . The frequency of eye exams is based on your age, health, family medical history, use  of contact lenses, and other factors. Follow your health care provider's recommendations for frequency of eye exams.  . Eat a healthy diet. Foods like vegetables, fruits, whole grains, low-fat dairy products, and lean protein foods contain the nutrients you need without too many calories. Decrease your intake  of foods high in solid fats, added sugars, and salt. Eat the right amount of calories for you. Get information about a proper diet from your health care provider, if necessary.  . Regular physical exercise is one of the most important things you can do for your health. Most adults should get at least 150 minutes of moderate-intensity exercise (any activity that increases your heart rate and causes you to sweat) each week. In addition, most adults need muscle-strengthening exercises on 2 or more days a week.  Silver Sneakers [place] be a benefit available to you. To determine eligibility, you [place] visit the website: www.silversneakers.com or contact program at 251 212 9203 Mon-Fri between 8AM-8PM.   . Maintain a healthy weight. The body mass index (BMI) is a screening tool to identify possible weight problems. It provides an estimate of body fat based on height and weight. Your health care provider can find your BMI and can help you achieve or maintain a healthy weight.   For adults 20 years and older: ? A BMI below 18.5 is considered underweight. ? A BMI of 18.5 to 24.9 is normal. ? A BMI of 25 to 29.9 is considered overweight. ? A BMI of 30 and above is considered obese.   . Maintain normal blood lipids and cholesterol levels by exercising and minimizing your intake of saturated fat. Eat a balanced diet with plenty of fruit and vegetables. Blood  tests for lipids and cholesterol should begin at age 37 and be repeated every 5 years. If your lipid or cholesterol levels are high, you are over 50, or you are at high risk for heart disease, you [place] need your cholesterol levels checked more frequently. Ongoing high lipid and cholesterol levels should be treated with medicines if diet and exercise are not working.  . If you smoke, find out from your health care provider how to quit. If you do not use tobacco, please do not start.  . If you choose to drink alcohol, please do not consume more than 2 drinks  per day. One drink is considered to be 12 ounces (355 mL) of beer, 5 ounces (148 mL) of wine, or 1.5 ounces (44 mL) of liquor.  . If you are 75-80 years old, ask your health care provider if you should take aspirin to prevent strokes.  . Use sunscreen. Apply sunscreen liberally and repeatedly throughout the day. You should seek shade when your shadow is shorter than you. Protect yourself by wearing long sleeves, pants, a wide-brimmed hat, and sunglasses year round, whenever you are outdoors.  . Once a month, do a whole body skin exam, using a mirror to look at the skin on your back. Tell your health care provider of new moles, moles that have irregular borders, moles that are larger than a pencil eraser, or moles that have changed in shape or color.

## 2019-03-02 NOTE — Progress Notes (Signed)
PCP notes:   Health maintenance:  Mammogram - ordered and sent to PCP for approval  Abnormal screenings:   None  Patient concerns:   None  Nurse concerns:  None  Next PCP appt:   03/08/19 @ 1215  I reviewed health advisor's note, was available for consultation, and agree with documentation and plan. Loura Pardon MD

## 2019-03-05 ENCOUNTER — Ambulatory Visit: Payer: PPO

## 2019-03-08 ENCOUNTER — Encounter: Payer: Self-pay | Admitting: Family Medicine

## 2019-03-08 ENCOUNTER — Ambulatory Visit (INDEPENDENT_AMBULATORY_CARE_PROVIDER_SITE_OTHER): Payer: PPO | Admitting: Family Medicine

## 2019-03-08 VITALS — Ht 64.5 in | Wt 157.0 lb

## 2019-03-08 DIAGNOSIS — R7989 Other specified abnormal findings of blood chemistry: Secondary | ICD-10-CM | POA: Diagnosis not present

## 2019-03-08 DIAGNOSIS — E039 Hypothyroidism, unspecified: Secondary | ICD-10-CM

## 2019-03-08 DIAGNOSIS — D649 Anemia, unspecified: Secondary | ICD-10-CM | POA: Diagnosis not present

## 2019-03-08 DIAGNOSIS — R739 Hyperglycemia, unspecified: Secondary | ICD-10-CM

## 2019-03-08 DIAGNOSIS — Z961 Presence of intraocular lens: Secondary | ICD-10-CM | POA: Diagnosis not present

## 2019-03-08 MED ORDER — LEVOTHYROXINE SODIUM 25 MCG PO TABS: ORAL_TABLET | ORAL | 3 refills | Status: DC

## 2019-03-08 NOTE — Assessment & Plan Note (Signed)
Anemia is worse- with Hb of 10.2 now  Some fatigue/not more than usual Ferritin is high again  Disc with pt Will ref to hematology for further eval

## 2019-03-08 NOTE — Assessment & Plan Note (Signed)
Lab Results  Component Value Date   HGBA1C 6.1 03/02/2019   disc imp of low glycemic diet and wt loss to prevent DM2  She will try to reduce sugar soda and bread in diet

## 2019-03-08 NOTE — Progress Notes (Signed)
Virtual Visit via Video Note  I connected with Carla [name] on 03/08/19 at 12:15 PM EDT by a video enabled telemedicine application and verified that I am speaking with the correct person using two identifiers.  Location: Patient: home Provider: office    I discussed the limitations of evaluation and management by telemedicine and the availability of in person appointments. The patient expressed understanding and agreed to proceed.  History of Present Illness: Pt presents for annual f/u of chronic medical problems   Is staying home during the pandemic   amw on 6/5 No concerns or gaps   Weight Wt Readings from Last 3 Encounters:  12/20/17 163 lb 8 oz (74.2 kg)  12/20/17 163 lb 8 oz (74.2 kg)  12/14/16 160 lb 8 oz (72.8 kg)   Last wt at home 157 lb -has lost a bit of weight  Eating well  /healthy  [facility]  Doing some walking     Mammogram -she called and could not get through/ has the order to schedule  Self breast exam - no changes or lumps  Several aunts with breast cancer   Nl pap 2016  colonosocpy 11/16 with 10 y recall   zostavax 2013 Would like to get shingrix    Normal dexa in 4/17  Normal also 5/19  Mother had OP  She takes ca and D   No derm issues-went in January Sees dentist every 6 months   Anemia Lab Results  Component Value Date   WBC 7.8 03/02/2019   HGB 10.2 (L) 03/02/2019   HCT 29.8 (L) 03/02/2019   MCV 96.8 03/02/2019   PLT 250.0 03/02/2019   Lab Results  Component Value Date   FERRITIN 370.7 (H) 03/02/2019   She does not take iron or mvi    Hypothyroidism  Pt has no clinical changes No change in energy level/ hair or skin/ edema and no tremor Lab Results  Component Value Date   TSH 2.45 03/02/2019    Added biotin last year for nails    Glucose elevated Lab Results  Component Value Date   HGBA1C 6.1 03/02/2019  up from 5.8  Prediabetic  She does drink pepsi  Not a lot of sweets  More bread than she  should Not a lot of pasta  White potatoes Has some diabetes in aunts and uncles   Has a garden - lots of veggies   Lab Results  Component Value Date   CREATININE 0.82 03/02/2019   BUN 17 03/02/2019   NA 138 03/02/2019   K 4.2 03/02/2019   CL 103 03/02/2019   CO2 27 03/02/2019   Lab Results  Component Value Date   ALT 12 03/02/2019   AST 14 03/02/2019   ALKPHOS 44 03/02/2019   BILITOT 0.7 03/02/2019       Patient Active Problem List   Diagnosis Date Noted  . Elevated ferritin 12/22/2017  . Hypothyroid 12/20/2017  . Blood glucose elevated 12/20/2017  . Mild anemia 12/19/2017  . Need for hepatitis C screening test 11/25/2016  . Initial Medicare annual wellness visit 10/10/2015  . Estrogen deficiency 10/10/2015  . Encounter for routine gynecological examination 10/08/2014  . Other screening mammogram 07/05/2012  . Routine gynecological examination 06/22/2011  . Routine general medical examination at a health care facility 06/15/2011  . ALLERGIC RHINITIS 06/26/2008   Past Medical History:  Diagnosis Date  . Allergic rhinitis, cause unspecified   . Anemia, unspecified   . Carpal tunnel  syndrome   . Cystitis, unspecified   . Family history of osteoporosis   . PONV (postoperative nausea and vomiting)   . Postnasal drip   . Syncope and collapse    Past Surgical History:  Procedure Laterality Date  . CARPAL TUNNEL RELEASE    . COLONOSCOPY  2005  . COLONOSCOPY WITH PROPOFOL N/A 07/31/2015   Procedure: COLONOSCOPY WITH PROPOFOL;  Surgeon: Ronald Lobo, MD;  Location: [facility];  Service: Endoscopy;  Laterality: N/A;  . DIAGNOSTIC LAPAROSCOPY     dx lack of pregnancy   . EYE SURGERY     lasik, cataract, prk,yag procedure   Social History   Tobacco Use  . Smoking status: Never Smoker  . Smokeless tobacco: Never Used  Substance Use Topics  . Alcohol use: Yes    Alcohol/week: 0.0 standard drinks    Comment: Rare  . Drug use: No   Family History  Problem  Relation Age of Onset  . Osteoporosis Mother   . Stroke Mother   . Coronary artery disease Father   . Stroke Father 51  . Diabetes Other        Aunts and uncles  . Breast cancer Paternal Aunt   . Breast cancer Maternal Aunt    Allergies  Allergen Reactions  . Codeine Nausea Only   Current Outpatient Medications on File Prior to Visit  Medication Sig Dispense Refill  . Calcium Carb-Cholecalciferol (CALCIUM 600+D3 PO) Take by mouth.    . cetirizine (ZYRTEC) 10 MG tablet Take 10 mg by mouth daily as needed for allergies.      No current facility-administered medications on file prior to visit.     Observations/Objective: Patient appears well, in no distress Weight is baseline  No facial swelling or asymmetry Normal voice-not hoarse and no slurred speech No obvious tremor or mobility impairment Moving neck and UEs normally Able to hear the call well  No cough or shortness of breath during interview  Talkative and mentally sharp with no cognitive changes No skin changes on face or neck , no rash or pallor Affect is normal    Assessment and Plan: Problem List Items Addressed This Visit      Endocrine   Hypothyroid - Primary    Hypothyroidism  Pt has no clinical changes No change in energy level/ hair or skin/ edema and no tremor Lab Results  Component Value Date   TSH 2.45 03/02/2019          Relevant Medications   levothyroxine (SYNTHROID) 25 MCG tablet     Other   Mild anemia    Anemia is worse- with Hb of 10.2 now  Some fatigue/not more than usual Ferritin is high again  Disc with pt Will ref to hematology for further eval      Relevant Orders   Ambulatory referral to Hematology   Blood glucose elevated    Lab Results  Component Value Date   HGBA1C 6.1 03/02/2019   disc imp of low glycemic diet and wt loss to prevent DM2  She will try to reduce sugar soda and bread in diet      Elevated ferritin    With anemia and some fatigue Will refer to  hematology for further eval      Relevant Orders   Ambulatory referral to Hematology       Follow Up Instructions: For diabetes prevention Try to get most of your carbohydrates from produce (with the exception of white potatoes)  Eat less bread/pasta/rice/snack  foods/cereals/sweets and other items from the middle of the grocery store (processed carbs)  Also try to exercise 30 minutes per day   Do call and get an appointment for a mammogram   I placed a referral with hematology to investigate your anemia with high iron stores The office will call you about this    I discussed the assessment and treatment plan with the patient. The patient was provided an opportunity to ask questions and all were answered. The patient agreed with the plan and demonstrated an understanding of the instructions.   The patient was advised to call back or seek an in-person evaluation if the symptoms worsen or if the condition fails to improve as anticipated.     Loura Pardon, MD

## 2019-03-08 NOTE — Assessment & Plan Note (Signed)
With anemia and some fatigue Will refer to hematology for further eval

## 2019-03-08 NOTE — Patient Instructions (Addendum)
For diabetes prevention Try to get most of your carbohydrates from produce (with the exception of white potatoes)  Eat less bread/pasta/rice/snack foods/cereals/sweets and other items from the middle of the grocery store (processed carbs)  Also try to exercise 30 minutes per day   Do call and get an appointment for a mammogram   I placed a referral with hematology to investigate your anemia with high iron stores The office will call you about this

## 2019-03-08 NOTE — Assessment & Plan Note (Signed)
Hypothyroidism  Pt has no clinical changes No change in energy level/ hair or skin/ edema and no tremor Lab Results  Component Value Date   TSH 2.45 03/02/2019

## 2019-03-19 ENCOUNTER — Other Ambulatory Visit: Payer: Self-pay

## 2019-03-19 ENCOUNTER — Encounter: Payer: Self-pay | Admitting: Internal Medicine

## 2019-03-19 ENCOUNTER — Inpatient Hospital Stay: Payer: PPO | Admitting: Internal Medicine

## 2019-03-19 NOTE — Progress Notes (Unsigned)
Patient verified using two identifiers for virtual visit via telephone today.  New patient today referred by PCP for new low hemoglobin and elevated ferritin on recent labs.  Patient does not offer any problems today.   Patient's cell phone  Number is [phone]

## 2019-03-19 NOTE — Progress Notes (Unsigned)
I connected with Carla [name] on 03/19/19 at 11:15 AM EDT by {Blank single:19197::"video enabled telemedicine visit","telephone visit"} and verified that I am speaking with the correct person using two identifiers.  I discussed the limitations, risks, security and privacy concerns of performing an evaluation and management service by telemedicine and the availability of in-person appointments. I also discussed with the patient that there Lesh be a patient responsible charge related to this service. The patient expressed understanding and agreed to proceed.    Other persons participating in the visit and their role in the encounter: ***  Patient's location: ***  Provider's location: ***   Oncology History   No history exists.     Chief Complaint: ***    History of present illness:Carla [name] 67 y.o.  female with history of   Observation/objective:  Assessment and plan: No problem-specific Assessment & Plan notes found for this encounter.    Follow-up instructions:  I discussed the assessment and treatment plan with the patient.  The patient was provided an opportunity to ask questions and all were answered.  The patient agreed with the plan and demonstrated understanding of instructions.  The patient was advised to call back or seek an in person evaluation if the symptoms worsen or if the condition fails to improve as anticipated.  I provided *** minutes of {Blank single:19197::"face-to-face video visit time","non face-to-face telephone visit time"} during this encounter, and > 50% was spent counseling as documented under my assessment & plan.   Dr. Charlaine Dalton [place] [facility] 03/19/2019 11:55 AM

## 2019-03-22 ENCOUNTER — Encounter: Admit: 2019-03-22 | Discharge: 2019-03-22 | Primary: Family

## 2019-03-22 DIAGNOSIS — C4362 Malignant melanoma of left upper limb, including shoulder: Secondary | ICD-10-CM

## 2019-03-22 DIAGNOSIS — R32 Unspecified urinary incontinence: Secondary | ICD-10-CM

## 2019-03-22 DIAGNOSIS — I10 Essential (primary) hypertension: Secondary | ICD-10-CM

## 2019-03-22 DIAGNOSIS — H3530 Unspecified macular degeneration: Secondary | ICD-10-CM

## 2019-03-22 DIAGNOSIS — R002 Palpitations: Secondary | ICD-10-CM

## 2019-03-22 DIAGNOSIS — C50919 Malignant neoplasm of unspecified site of unspecified female breast: Secondary | ICD-10-CM

## 2019-03-22 DIAGNOSIS — C439 Malignant melanoma of skin, unspecified: Secondary | ICD-10-CM

## 2019-03-22 DIAGNOSIS — M1990 Unspecified osteoarthritis, unspecified site: Secondary | ICD-10-CM

## 2019-03-22 DIAGNOSIS — I471 Supraventricular tachycardia: Secondary | ICD-10-CM

## 2019-03-22 DIAGNOSIS — E669 Obesity, unspecified: Secondary | ICD-10-CM

## 2019-03-22 LAB — LDH-LACTATE DEHYDROGENASE: Lab: 177 U/L (ref 100–210)

## 2019-03-23 ENCOUNTER — Ambulatory Visit
Admission: RE | Admit: 2019-03-23 | Discharge: 2019-03-23 | Disposition: A | Discharge status: Home / Self Care | Payer: PPO | Source: Ambulatory Visit | Attending: Family Medicine | Admitting: Family Medicine

## 2019-03-23 ENCOUNTER — Other Ambulatory Visit: Payer: Self-pay

## 2019-03-23 DIAGNOSIS — Z1231 Encounter for screening mammogram for malignant neoplasm of breast: Secondary | ICD-10-CM | POA: Insufficient documentation

## 2019-03-25 ENCOUNTER — Encounter: Admit: 2019-03-25 | Discharge: 2019-03-25 | Primary: Family

## 2019-03-25 DIAGNOSIS — C439 Malignant melanoma of skin, unspecified: Secondary | ICD-10-CM

## 2019-03-25 DIAGNOSIS — E669 Obesity, unspecified: Secondary | ICD-10-CM

## 2019-03-25 DIAGNOSIS — H3530 Unspecified macular degeneration: Secondary | ICD-10-CM

## 2019-03-25 DIAGNOSIS — I10 Essential (primary) hypertension: Secondary | ICD-10-CM

## 2019-03-25 DIAGNOSIS — M1990 Unspecified osteoarthritis, unspecified site: Secondary | ICD-10-CM

## 2019-03-25 DIAGNOSIS — C50919 Malignant neoplasm of unspecified site of unspecified female breast: Secondary | ICD-10-CM

## 2019-03-25 DIAGNOSIS — R002 Palpitations: Secondary | ICD-10-CM

## 2019-03-25 DIAGNOSIS — I471 Supraventricular tachycardia: Secondary | ICD-10-CM

## 2019-03-26 ENCOUNTER — Encounter: Payer: Self-pay | Admitting: Internal Medicine

## 2019-03-26 ENCOUNTER — Other Ambulatory Visit: Payer: Self-pay

## 2019-03-26 ENCOUNTER — Inpatient Hospital Stay: Payer: PPO | Attending: Internal Medicine | Admitting: Internal Medicine

## 2019-03-26 ENCOUNTER — Inpatient Hospital Stay: Payer: PPO

## 2019-03-26 VITALS — BP 112/71 | HR 65 | Temp 98.1°F | Resp 20 | Ht 64.5 in | Wt 157.0 lb

## 2019-03-26 DIAGNOSIS — Z79899 Other long term (current) drug therapy: Secondary | ICD-10-CM | POA: Insufficient documentation

## 2019-03-26 DIAGNOSIS — D649 Anemia, unspecified: Secondary | ICD-10-CM

## 2019-03-26 DIAGNOSIS — E039 Hypothyroidism, unspecified: Secondary | ICD-10-CM | POA: Insufficient documentation

## 2019-03-26 DIAGNOSIS — Z803 Family history of malignant neoplasm of breast: Secondary | ICD-10-CM | POA: Insufficient documentation

## 2019-03-26 LAB — FOLATE: Folate: 19.7 ng/mL (ref >5.9)

## 2019-03-26 LAB — CBC WITH DIFFERENTIAL/PLATELET
Abs Immature Granulocytes: 0.12 10*3/uL — ABNORMAL HIGH (ref 0.00–0.07)
Basophils Absolute: 0.1 10*3/uL (ref 0.0–0.1)
Basophils Relative: 1 %
Eosinophils Absolute: 0.1 10*3/uL (ref 0.0–0.5)
Eosinophils Relative: 1 %
HCT: 29.1 % — ABNORMAL LOW (ref 36.0–46.0)
Hemoglobin: 9.5 g/dL — ABNORMAL LOW (ref 12.0–15.0)
Immature Granulocytes: 2 %
Lymphocytes Relative: 32 %
Lymphs Abs: 2.3 10*3/uL (ref 0.7–4.0)
MCH: 32.3 pg (ref 26.0–34.0)
MCHC: 32.6 g/dL (ref 30.0–36.0)
MCV: 99 fL (ref 80.0–100.0)
Monocytes Absolute: 0.8 10*3/uL (ref 0.1–1.0)
Monocytes Relative: 11 %
Neutro Abs: 3.9 10*3/uL (ref 1.7–7.7)
Neutrophils Relative %: 53 %
Platelets: 256 10*3/uL (ref 150–400)
RBC: 2.94 MIL/uL — ABNORMAL LOW (ref 3.87–5.11)
RDW: 21.2 % — ABNORMAL HIGH (ref 11.5–15.5)
WBC: 7.2 10*3/uL (ref 4.0–10.5)
nRBC: 0.6 % — ABNORMAL HIGH (ref 0.0–0.2)

## 2019-03-26 LAB — IRON AND TIBC
Iron: 141 ug/dL (ref 28–170)
Saturation Ratios: 45 % — ABNORMAL HIGH (ref 10.4–31.8)
TIBC: 312 ug/dL (ref 250–450)
UIBC: 171 ug/dL

## 2019-03-26 LAB — LACTATE DEHYDROGENASE: LDH: 129 U/L (ref 98–192)

## 2019-03-26 LAB — RETICULOCYTES
Immature Retic Fract: 21.5 % — ABNORMAL HIGH (ref 2.3–15.9)
RBC.: 2.94 MIL/uL — ABNORMAL LOW (ref 3.87–5.11)
Retic Count, Absolute: 62.6 10*3/uL (ref 19.0–186.0)
Retic Ct Pct: 2.1 % (ref 0.4–3.1)

## 2019-03-26 LAB — TECHNOLOGIST SMEAR REVIEW

## 2019-03-26 LAB — VITAMIN B12: Vitamin B-12: 274 pg/mL (ref 180–914)

## 2019-03-26 LAB — C-REACTIVE PROTEIN: CRP: <0.8 mg/dL (ref <1.0)

## 2019-03-26 NOTE — Progress Notes (Signed)
[facility] NOTE  Patient Care Team: [name], [name], MD as PCP - General  CHIEF COMPLAINTS/PURPOSE OF CONSULTATION:    HEMATOLOGY HISTORY  # 2019- ANEMIA 10-11 EGD-none; colonoscopy- 2016 [Dr.Bucinni];capsule-none;  Bone marrow Biopsy-none   HISTORY OF PRESENTING ILLNESS:  Carla [name] 68 y.o.  female has been referred to [place] for further evaluation/work-up for anemia.  Patient denies any major medical problems except for mild hypothyroidism diagnosed approximately year ago.  She denies any unusual joint pains or bone pain.  She denies any unusual headaches.  Denies unusual nausea vomiting.   No blood in stools or black or stools.  No vaginal bleeding.  No blood in urine.  No abnormal weight loss.  She has not been on iron pills.  Patient was told to be mildly anemic approximately year ago; however it progressively got worse more recently hemoglobin over 10.   Review of Systems  Constitutional: Negative for chills, diaphoresis, fever, malaise/fatigue and weight loss.  HENT: Negative for nosebleeds and sore throat.   Eyes: Negative for double vision.  Respiratory: Negative for cough, hemoptysis, sputum production, shortness of breath and wheezing.   Cardiovascular: Negative for chest pain, palpitations, orthopnea and leg swelling.  Gastrointestinal: Negative for abdominal pain, blood in stool, constipation, diarrhea, heartburn, melena, nausea and vomiting.  Genitourinary: Negative for dysuria, frequency and urgency.  Musculoskeletal: Negative for back pain and joint pain.  Skin: Negative.  Negative for itching and rash.  Neurological: Negative for dizziness, tingling, focal weakness, weakness and headaches.  Endo/Heme/Allergies: Does not bruise/bleed easily.  Psychiatric/Behavioral: Negative for depression. The patient is not nervous/anxious and does not have insomnia.     MEDICAL HISTORY:  Past Medical History:  Diagnosis Date  . Allergic rhinitis, cause  unspecified   . Anemia, unspecified   . Carpal tunnel syndrome   . Cystitis, unspecified   . Family history of osteoporosis   . PONV (postoperative nausea and vomiting)   . Postnasal drip   . Syncope and collapse     SURGICAL HISTORY: Past Surgical History:  Procedure Laterality Date  . CARPAL TUNNEL RELEASE    . COLONOSCOPY  2005  . COLONOSCOPY WITH PROPOFOL N/A 07/31/2015   Procedure: COLONOSCOPY WITH PROPOFOL;  Surgeon: Ronald Lobo, MD;  Location: [facility];  Service: Endoscopy;  Laterality: N/A;  . DIAGNOSTIC LAPAROSCOPY     dx lack of pregnancy   . EYE SURGERY     lasik, cataract, prk,yag procedure    SOCIAL HISTORY: Social History   Socioeconomic History  . Marital status: Married    Spouse name: Not on file  . Number of children: Not on file  . Years of education: Not on file  . Highest education level: Not on file  Occupational History  . Not on file  Social Needs  . Financial resource strain: Not on file  . Food insecurity    Worry: Not on file    Inability: Not on file  . Transportation needs    Medical: Not on file    Non-medical: Not on file  Tobacco Use  . Smoking status: Never Smoker  . Smokeless tobacco: Never Used  Substance and Sexual Activity  . Alcohol use: Yes    Alcohol/week: 0.0 standard drinks    Comment: Rare  . Drug use: No  . Sexual activity: Not Currently  Lifestyle  . Physical activity    Days per week: Not on file    Minutes per session: Not on file  .  Stress: Not on file  Relationships  . Social Herbalist on phone: Not on file    Gets together: Not on file    Attends religious service: Not on file    Active member of club or organization: Not on file    Attends meetings of clubs or organizations: Not on file    Relationship status: Not on file  . Intimate partner violence    Fear of current or ex partner: Not on file    Emotionally abused: Not on file    Physically abused: Not on file    Forced sexual  activity: Not on file  Other Topics Concern  . Not on file  Social History Narrative   No regular exercise      Drinks lots of Pepsi          FAMILY HISTORY: Family History  Problem Relation Age of Onset  . Osteoporosis Mother   . Stroke Mother   . Coronary artery disease Father   . Stroke Father 44  . Diabetes Other        Aunts and uncles  . Breast cancer Paternal Aunt   . Breast cancer Maternal Aunt   . Arthritis/Rheumatoid Child     ALLERGIES:  is allergic to codeine.  MEDICATIONS:  Current Outpatient Medications  Medication Sig Dispense Refill  . Calcium Carb-Cholecalciferol (CALCIUM 600+D3 PO) Take by mouth.    . cetirizine (ZYRTEC) 10 MG tablet Take 10 mg by mouth daily as needed for allergies.     Marland Kitchen levothyroxine (SYNTHROID) 25 MCG tablet TAKE 1 TABLET BY MOUTH ONCE A DAY. TAKE ON AN EMPTY STOMACH WITH A GLASS OF WATER ATLEAST 30-60 MIN BEFORE BREAKFAST 90 tablet 3   No current facility-administered medications for this visit.       PHYSICAL EXAMINATION:   Vitals:   03/26/19 1515  BP: 112/71  Pulse: 65  Resp: 20  Temp: 98.1 F (36.7 C)   Filed Weights   03/26/19 1515  Weight: 157 lb (71.2 kg)    Physical Exam  Constitutional: She is oriented to person, place, and time and well-developed, well-nourished, and in no distress.  HENT:  Head: Normocephalic and atraumatic.  Mouth/Throat: Oropharynx is clear and moist. No oropharyngeal exudate.  Eyes: Pupils are equal, round, and reactive to light.  Neck: Normal range of motion. Neck supple.  Cardiovascular: Normal rate and regular rhythm.  Pulmonary/Chest: No respiratory distress. She has no wheezes.  Abdominal: Soft. Bowel sounds are normal. She exhibits no distension and no mass. There is no abdominal tenderness. There is no rebound and no guarding.  Musculoskeletal: Normal range of motion.        General: No tenderness or edema.  Neurological: She is alert and oriented to person, place, and time.   Skin: Skin is warm.  Psychiatric: Affect normal.    LABORATORY DATA:  I have reviewed the data as listed Lab Results  Component Value Date   WBC 7.8 03/02/2019   HGB 10.2 (L) 03/02/2019   HCT 29.8 (L) 03/02/2019   MCV 96.8 03/02/2019   PLT 250.0 03/02/2019   Recent Labs    03/02/19 1101  NA 138  K 4.2  CL 103  CO2 27  GLUCOSE 88  BUN 17  CREATININE 0.82  CALCIUM 9.1  PROT 7.4  ALBUMIN 4.2  AST 14  ALT 12  ALKPHOS 44  BILITOT 0.7     Mm 3d Screen Breast Bilateral  Result Date: 03/23/2019 CLINICAL  DATA:  Screening. EXAM: DIGITAL SCREENING BILATERAL MAMMOGRAM WITH TOMO AND CAD COMPARISON:  Previous exam(s). ACR Breast Density Category b: There are scattered areas of fibroglandular density. FINDINGS: There are no findings suspicious for malignancy. Images were processed with CAD. IMPRESSION: No mammographic evidence of malignancy. A result letter of this screening mammogram will be mailed directly to the patient. RECOMMENDATION: Screening mammogram in one year. (Code:SM-B-01Y) BI-RADS CATEGORY  1: Negative. Electronically Signed   By: Fidela Salisbury M.D.   On: 03/23/2019 13:47    Normocytic anemia #Normocytic anemia-unclear etiology for the last 1 year slowly progressively getting worse.  Hemoglobin around 10.  #Recommend work-up including CBC/peripheral smear C-reactive protein H23 folic acid myeloma work-up LDH; iron TIBC reticulocyte count.  Discussed the possible need for bone marrow biopsy if above work-up is inconclusive.  Patient agreement.  #Hypothyroidism-labile as per patient.  Thank you Dr.[name] for allowing me to participate in the care of your pleasant patient. Please do not hesitate to contact me with questions or concerns in the interim.   # DISPOSITION: # labs today # follow up in 2 weeks;MD-  No labs- Dr.B   All questions were answered. The patient knows to call the clinic with any problems, questions or concerns.      Cammie Sickle, MD 03/26/2019 4:06 PM

## 2019-03-26 NOTE — Assessment & Plan Note (Addendum)
#  Normocytic anemia-unclear etiology for the last 1 year slowly progressively getting worse.  Hemoglobin around 10.  #Recommend work-up including CBC/peripheral smear C-reactive protein O97 folic acid myeloma work-up LDH; iron TIBC reticulocyte count.  Discussed the possible need for bone marrow biopsy if above work-up is inconclusive.  Patient agreement.  #Hypothyroidism-labile as per patient.  Thank you Dr.[name] for allowing me to participate in the care of your pleasant patient. Please do not hesitate to contact me with questions or concerns in the interim.   # DISPOSITION: # labs today # follow up in 2 weeks;MD-  No labs- Dr.B

## 2019-03-26 NOTE — Progress Notes
Name: Kaitlyn Friedman          MRN: [id number]      DOB: 08/30/1951      AGE: 68 y.o.   DATE OF SERVICE: 03/22/2019    Subjective:             Reason for Visit:  Cancer      Kaitlyn Friedman is a 68 y.o. female.     Cancer Staging  Malignant neoplasm of upper-outer quadrant of left female breast [facility])  Staging form: Breast, AJCC 7th Edition  - Pathologic: Stage IA (T1, N0, cM0) - Signed by Cammy Copa, [name] on 03/28/2015    Melanoma [facility])  Staging form: Melanoma of the Skin, AJCC 7th Edition  - Pathologic: Stage IB (T2a, N0, cM0) - Signed by Sherril Croon, [name] on 02/21/2015      History of Present Illness  Kaitlyn Friedman is a 68 y.o. 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 She noticed the lesion changing over a few months and sought evaluation from dermatology who performed the initial biopsy on 12/24/2014 showing 2.1 mm melanoma. She also has a history of left breast cancer in 2013.  ???  On 02/07/2015, I performed a wide local excision of the left arm, intraoperative lymphatic mapping, left axillary sentinel lymph node biopsy. Pathology revealed no residual melanoma and axillary lymph node basin revealed 0 of 8 nodes positive for metastasis.    She denies any new dysplastic nevi or subcutaneous masses.  No new skin malignancies since last visit.  Denies any adenopathy.  She reports that she has been struggling with incomplete emptying of her bladder and urinary incontinence.         Review of Systems   Hematological: Negative for adenopathy.         Objective:         ??? Cholecalciferol (Vitamin D3) 2,000 unit cap take by mouth daily   ??? cyclobenzaprine (FLEXERIL) 10 mg tablet    ??? ibuprofen (MOTRIN) 800 mg tablet Take 1 tablet by mouth every 6 hours as needed for Pain. Take with food.   ??? lisinopril (PRINIVIL, ZESTRIL) 40 mg tablet Take 40 mg by mouth at bedtime daily.   ??? metoprolol XL (TOPROL XL) 50 mg tablet Take 50 mg by mouth at bedtime daily.   ??? MULTIVITAMIN PO Take 1 tablet by mouth daily. ??? Vitamin A-Vitamin C-Vit E-Min (PRESERVISION AREDS) cap Take 1 capsule by mouth daily.     Vitals:    03/22/19 0935   BP: (!) 157/85   BP Source: Arm, Right Upper   Patient Position: Sitting   Pulse: 62   Resp: 16   Temp: 36.6 ???C (97.9 ???F)   SpO2: 95%   Weight: 103.8 kg (228 lb 12.8 oz)   Height: 166 cm (65.35)   PainSc: Zero     Body mass index is 37.67 kg/m???.     Pain Score: Zero       Fatigue Scale: 4    Pain Addressed:  Current regimen working to control pain.    Patient Evaluated for a Clinical Trial: No treatment clinical trial available for this patient.     [place] Cooperative Oncology Group performance status is 0, Fully active, able to carry on all pre-disease performance without restriction.Marland Kitchen     Physical Exam  Constitutional:       General: She is not in acute distress.     Appearance: Normal appearance. She is well-developed.  Eyes:      General: No scleral icterus.     Conjunctiva/sclera: Conjunctivae normal.   Pulmonary:      Effort: Pulmonary effort is normal. No respiratory distress.   Abdominal:      Palpations: Abdomen is soft. There is no mass.   Lymphadenopathy:      Cervical: No cervical adenopathy.      Upper Body:      Right upper body: No supraclavicular or axillary adenopathy.      Left upper body: No supraclavicular or axillary adenopathy.      Lower Body: No right inguinal adenopathy. No left inguinal adenopathy.   Skin:     Comments: Left arm incision without nodularity or pigmentation.  No new dysplastic nevi or subcutaneous masses.     Neurological:      Mental Status: She is alert.   Psychiatric:         Mood and Affect: Mood normal.         Behavior: Behavior normal.         Thought Content: Thought content normal.         Judgment: Judgment normal.          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

## 2019-03-27 LAB — MULTIPLE MYELOMA PANEL, SERUM
Albumin SerPl Elph-Mcnc: 4.1 g/dL (ref 2.9–4.4)
Albumin/Glob SerPl: 1.2 (ref 0.7–1.7)
Alpha 1: 0.2 g/dL (ref 0.0–0.4)
Alpha2 Glob SerPl Elph-Mcnc: 0.6 g/dL (ref 0.4–1.0)
B-Globulin SerPl Elph-Mcnc: 1.1 g/dL (ref 0.7–1.3)
Gamma Glob SerPl Elph-Mcnc: 1.6 g/dL (ref 0.4–1.8)
Globulin, Total: 3.5 g/dL (ref 2.2–3.9)
IgA: 370 mg/dL — ABNORMAL HIGH (ref 87–352)
IgG (Immunoglobin G), Serum: 1729 mg/dL — ABNORMAL HIGH (ref 586–1602)
IgM (Immunoglobulin M), Srm: 95 mg/dL (ref 26–217)
Total Protein ELP: 7.6 g/dL (ref 6.0–8.5)

## 2019-03-27 LAB — KAPPA/LAMBDA LIGHT CHAINS
Kappa free light chain: 41.2 mg/L — ABNORMAL HIGH (ref 3.3–19.4)
Kappa, lambda light chain ratio: 1.74 — ABNORMAL HIGH (ref 0.26–1.65)
Lambda free light chains: 23.7 mg/L (ref 5.7–26.3)

## 2019-04-02 ENCOUNTER — Encounter: Payer: PPO | Admitting: Internal Medicine

## 2019-04-09 ENCOUNTER — Other Ambulatory Visit: Payer: Self-pay

## 2019-04-10 ENCOUNTER — Other Ambulatory Visit: Payer: Self-pay

## 2019-04-10 ENCOUNTER — Inpatient Hospital Stay: Payer: PPO | Attending: Internal Medicine | Admitting: Internal Medicine

## 2019-04-10 ENCOUNTER — Encounter: Payer: Self-pay | Admitting: Internal Medicine

## 2019-04-10 DIAGNOSIS — E039 Hypothyroidism, unspecified: Secondary | ICD-10-CM | POA: Diagnosis not present

## 2019-04-10 DIAGNOSIS — Z803 Family history of malignant neoplasm of breast: Secondary | ICD-10-CM

## 2019-04-10 DIAGNOSIS — Z79899 Other long term (current) drug therapy: Secondary | ICD-10-CM

## 2019-04-10 DIAGNOSIS — D649 Anemia, unspecified: Secondary | ICD-10-CM | POA: Diagnosis not present

## 2019-04-10 NOTE — Patient Instructions (Signed)
Please contact our office with your decision on whether to proceed with a bone marrow biopsy. You Melgarejo call Nira Conn, RN directly at [phone].

## 2019-04-10 NOTE — Assessment & Plan Note (Addendum)
#  Normocytic anemia-unclear etiology for the last 1 year slowly progressively getting worse.  Hemoglobin ~9.5/ slowly worsening.  Extensive peripheral blood work-up-negative for any obvious causes [LDH normal/iron studies L84 folic acid multiple myeloma panel unremarkable-kappa lambda light chain slightly abnormal].  Peripheral review of smear-teardrop cells/polychromasia-mixed RBC population.   #Given the absence of any definitive diagnosis based on above work-up-I recommend a bone marrow biopsy and aspiration.  Lengthy discussion with the patient regarding the need for biopsy-) to diagnose any underlying primary bone marrow process like MDS.  Patient is reluctant; and wants to talk to family regarding getting the test locally versus/going to [facility] for opinion.  I did recommend she inform us of her decision as soon as possible so that we can arrange for the procedure locally if she is interested.  #Hypothyroidism-stable.  Unlikely cause of her anemia.  # DISPOSITION: # follow up TBD- Dr.B  Cc; dr.Towers.

## 2019-04-10 NOTE — Progress Notes (Signed)
[place] NOTE  Patient Care Team: [name], [name], MD as PCP - General  CHIEF COMPLAINTS/PURPOSE OF CONSULTATION: Anemia   HEMATOLOGY HISTORY  # 2019- ANEMIA 10-11 EGD-none; colonoscopy- 2016 [Dr.Bucinni];capsule-none;  Bone marrow Biopsy-none; July 2020-hemoglobin 9.5; iron studies A91 folic acid myeloma panel normal; Iron related issues slightly abnormal-recommend bone marrow biopsy  #Mild hypothyroidism-on Synthroid.  HISTORY OF PRESENTING ILLNESS:  Carla [name] 68 y.o.  female is here for follow-up to review the results of her blood work ordered for anemia.  Patient admits to mild fatigue.  Otherwise not any worse.  Denies any worsening shortness of breath or cough but denies any blood in stools or black her stools.   Review of Systems  Constitutional: Positive for malaise/fatigue. Negative for chills, diaphoresis, fever and weight loss.  HENT: Negative for nosebleeds and sore throat.   Eyes: Negative for double vision.  Respiratory: Negative for cough, hemoptysis, sputum production, shortness of breath and wheezing.   Cardiovascular: Negative for chest pain, palpitations, orthopnea and leg swelling.  Gastrointestinal: Negative for abdominal pain, blood in stool, constipation, diarrhea, heartburn, melena, nausea and vomiting.  Genitourinary: Negative for dysuria, frequency and urgency.  Musculoskeletal: Negative for back pain and joint pain.  Skin: Negative.  Negative for itching and rash.  Neurological: Negative for dizziness, tingling, focal weakness, weakness and headaches.  Endo/Heme/Allergies: Does not bruise/bleed easily.  Psychiatric/Behavioral: Negative for depression. The patient is not nervous/anxious and does not have insomnia.     MEDICAL HISTORY:  Past Medical History:  Diagnosis Date  . Allergic rhinitis, cause unspecified   . Anemia, unspecified   . Carpal tunnel syndrome   . Cystitis, unspecified   . Family history of osteoporosis    . PONV (postoperative nausea and vomiting)   . Postnasal drip   . Syncope and collapse     SURGICAL HISTORY: Past Surgical History:  Procedure Laterality Date  . CARPAL TUNNEL RELEASE    . COLONOSCOPY  2005  . COLONOSCOPY WITH PROPOFOL N/A 07/31/2015   Procedure: COLONOSCOPY WITH PROPOFOL;  Surgeon: Ronald Lobo, MD;  Location: [facility];  Service: Endoscopy;  Laterality: N/A;  . DIAGNOSTIC LAPAROSCOPY     dx lack of pregnancy   . EYE SURGERY     lasik, cataract, prk,yag procedure    SOCIAL HISTORY: Social History   Socioeconomic History  . Marital status: Married    Spouse name: Not on file  . Number of children: Not on file  . Years of education: Not on file  . Highest education level: Not on file  Occupational History  . Not on file  Social Needs  . Financial resource strain: Not on file  . Food insecurity    Worry: Not on file    Inability: Not on file  . Transportation needs    Medical: Not on file    Non-medical: Not on file  Tobacco Use  . Smoking status: Never Smoker  . Smokeless tobacco: Never Used  Substance and Sexual Activity  . Alcohol use: Yes    Alcohol/week: 0.0 standard drinks    Comment: Rare  . Drug use: No  . Sexual activity: Not Currently  Lifestyle  . Physical activity    Days per week: Not on file    Minutes per session: Not on file  . Stress: Not on file  Relationships  . Social Herbalist on phone: Not on file    Gets together: Not on file  Attends religious service: Not on file    Active member of club or organization: Not on file    Attends meetings of clubs or organizations: Not on file    Relationship status: Not on file  . Intimate partner violence    Fear of current or ex partner: Not on file    Emotionally abused: Not on file    Physically abused: Not on file    Forced sexual activity: Not on file  Other Topics Concern  . Not on file  Social History Narrative   No regular exercise      Drinks lots  of [employer]          FAMILY HISTORY: Family History  Problem Relation Age of Onset  . Osteoporosis Mother   . Stroke Mother   . Coronary artery disease Father   . Stroke Father 110  . Diabetes Other        Aunts and uncles  . Breast cancer Paternal Aunt   . Breast cancer Maternal Aunt   . Arthritis/Rheumatoid Child     ALLERGIES:  is allergic to codeine.  MEDICATIONS:  Current Outpatient Medications  Medication Sig Dispense Refill  . BIOTIN PO Take 1 tablet by mouth daily.    . Calcium Carb-Cholecalciferol (CALCIUM 600+D3 PO) Take by mouth.    . cetirizine (ZYRTEC) 10 MG tablet Take 10 mg by mouth daily as needed for allergies.     Marland Kitchen levothyroxine (SYNTHROID) 25 MCG tablet TAKE 1 TABLET BY MOUTH ONCE A DAY. TAKE ON AN EMPTY STOMACH WITH A GLASS OF WATER ATLEAST 30-60 MIN BEFORE BREAKFAST 90 tablet 3   No current facility-administered medications for this visit.       PHYSICAL EXAMINATION:   Vitals:   04/10/19 0952  BP: 112/70  Pulse: 90  Resp: 20  Temp: 97.8 F (36.6 C)   Filed Weights   04/10/19 0952  Weight: 157 lb (71.2 kg)    Physical Exam  Constitutional: She is oriented to person, place, and time and well-developed, well-nourished, and in no distress.  HENT:  Head: Normocephalic and atraumatic.  Mouth/Throat: Oropharynx is clear and moist. No oropharyngeal exudate.  Eyes: Pupils are equal, round, and reactive to light.  Neck: Normal range of motion. Neck supple.  Cardiovascular: Normal rate and regular rhythm.  Pulmonary/Chest: No respiratory distress. She has no wheezes.  Abdominal: Soft. Bowel sounds are normal. She exhibits no distension and no mass. There is no abdominal tenderness. There is no rebound and no guarding.  Musculoskeletal: Normal range of motion.        General: No tenderness or edema.  Neurological: She is alert and oriented to person, place, and time.  Skin: Skin is warm.  Psychiatric: Affect normal.    LABORATORY DATA:  I have  reviewed the data as listed Lab Results  Component Value Date   WBC 7.2 03/26/2019   HGB 9.5 (L) 03/26/2019   HCT 29.1 (L) 03/26/2019   MCV 99.0 03/26/2019   PLT 256 03/26/2019   Recent Labs    03/02/19 1101  NA 138  K 4.2  CL 103  CO2 27  GLUCOSE 88  BUN 17  CREATININE 0.82  CALCIUM 9.1  PROT 7.4  ALBUMIN 4.2  AST 14  ALT 12  ALKPHOS 44  BILITOT 0.7     Mm 3d Screen Breast Bilateral  Result Date: 03/23/2019 CLINICAL DATA:  Screening. EXAM: DIGITAL SCREENING BILATERAL MAMMOGRAM WITH TOMO AND CAD COMPARISON:  Previous exam(s). ACR Breast  Density Category b: There are scattered areas of fibroglandular density. FINDINGS: There are no findings suspicious for malignancy. Images were processed with CAD. IMPRESSION: No mammographic evidence of malignancy. A result letter of this screening mammogram will be mailed directly to the patient. RECOMMENDATION: Screening mammogram in one year. (Code:SM-B-01Y) BI-RADS CATEGORY  1: Negative. Electronically Signed   By: Fidela Salisbury M.D.   On: 03/23/2019 13:47    Normocytic anemia #Normocytic anemia-unclear etiology for the last 1 year slowly progressively getting worse.  Hemoglobin ~9.5/ slowly worsening.  Extensive peripheral blood work-up-negative for any obvious causes [LDH normal/iron studies G66 folic acid multiple myeloma panel unremarkable-kappa lambda light chain slightly abnormal].  Peripheral review of smear-teardrop cells/polychromasia-mixed RBC population.   #Given the absence of any definitive diagnosis based on above work-up-I recommend a bone marrow biopsy and aspiration.  Lengthy discussion with the patient regarding the need for biopsy-) to diagnose any underlying primary bone marrow process like MDS.  Patient is reluctant; and wants to talk to family regarding getting the test locally versus/going to [facility] for opinion.  I did recommend she inform us of her decision as soon as possible so that we can arrange for the  procedure locally if she is interested.  #Hypothyroidism-stable.  Unlikely cause of her anemia.  # DISPOSITION: # follow up TBD- Dr.B  Cc; dr.Towers.   All questions were answered. The patient knows to call the clinic with any problems, questions or concerns.   Cammie Sickle, MD 04/10/2019 2:37 PM

## 2019-04-11 ENCOUNTER — Telehealth: Payer: Self-pay | Admitting: *Deleted

## 2019-04-11 ENCOUNTER — Other Ambulatory Visit: Payer: Self-pay | Admitting: Internal Medicine

## 2019-04-11 DIAGNOSIS — D649 Anemia, unspecified: Secondary | ICD-10-CM

## 2019-04-11 NOTE — Progress Notes (Signed)
msg sent to spec. Scheduling to arrange for bone marrow biopsy.

## 2019-04-11 NOTE — Progress Notes (Signed)
apts made for patient. She is aware of the bone marrow biopsy date/time and f/u apts.

## 2019-04-11 NOTE — Telephone Encounter (Signed)
Patient called. Left vm for RN. She would like to proceed with setting up her bone marrow biopsy at [place]. She prefers the bone marrow to be scheduled next Monday or Tuesday is possible.  Dr. B- pls enter orders. Thanks.

## 2019-04-11 NOTE — Telephone Encounter (Signed)
Called patient to discuss her bone marrow biopsy date. Time/date of procedure discussed. Reminded patient of the following instructions: Please arrive 60 minutes prior to appointment time. Also, please do not eat or drink anything after midnight (6-8 hours NPO) except blood pressure/heart/seizure medications. Please take with just a sip of water

## 2019-04-11 NOTE — Progress Notes (Signed)
Pt agreed to Bone marrow Biopsy- this is ordered.  Please have pt follow up in 1 week/no labs- post bone marrow biopsy to review the results.   Thanks GB

## 2019-04-12 ENCOUNTER — Other Ambulatory Visit: Payer: Self-pay | Admitting: Radiology

## 2019-04-13 ENCOUNTER — Other Ambulatory Visit: Payer: Self-pay | Admitting: Student

## 2019-04-16 ENCOUNTER — Other Ambulatory Visit: Payer: Self-pay

## 2019-04-16 ENCOUNTER — Ambulatory Visit
Admission: RE | Admit: 2019-04-16 | Discharge: 2019-04-16 | Disposition: A | Discharge status: Home / Self Care | Payer: PPO | Source: Ambulatory Visit | Attending: Internal Medicine | Admitting: Internal Medicine

## 2019-04-16 ENCOUNTER — Other Ambulatory Visit (HOSPITAL_COMMUNITY)
Admission: RE | Admit: 2019-04-16 | Disposition: A | Discharge status: Home / Self Care | Payer: PPO | Source: Ambulatory Visit | Attending: Internal Medicine | Admitting: Internal Medicine

## 2019-04-16 DIAGNOSIS — D649 Anemia, unspecified: Secondary | ICD-10-CM | POA: Insufficient documentation

## 2019-04-16 DIAGNOSIS — Z803 Family history of malignant neoplasm of breast: Secondary | ICD-10-CM | POA: Insufficient documentation

## 2019-04-16 DIAGNOSIS — Z79890 Hormone replacement therapy (postmenopausal): Secondary | ICD-10-CM | POA: Diagnosis not present

## 2019-04-16 DIAGNOSIS — D72822 Plasmacytosis: Secondary | ICD-10-CM | POA: Diagnosis not present

## 2019-04-16 DIAGNOSIS — Z79899 Other long term (current) drug therapy: Secondary | ICD-10-CM | POA: Diagnosis not present

## 2019-04-16 DIAGNOSIS — Z885 Allergy status to narcotic agent status: Secondary | ICD-10-CM | POA: Diagnosis not present

## 2019-04-16 DIAGNOSIS — Z833 Family history of diabetes mellitus: Secondary | ICD-10-CM | POA: Diagnosis not present

## 2019-04-16 LAB — CBC WITH DIFFERENTIAL/PLATELET
Abs Immature Granulocytes: 0.1 10*3/uL — ABNORMAL HIGH (ref 0.00–0.07)
Basophils Absolute: 0.1 10*3/uL (ref 0.0–0.1)
Basophils Relative: 1 %
Eosinophils Absolute: 0.1 10*3/uL (ref 0.0–0.5)
Eosinophils Relative: 1 %
HCT: 30.7 % — ABNORMAL LOW (ref 36.0–46.0)
Hemoglobin: 10.1 g/dL — ABNORMAL LOW (ref 12.0–15.0)
Immature Granulocytes: 2 %
Lymphocytes Relative: 30 %
Lymphs Abs: 1.8 10*3/uL (ref 0.7–4.0)
MCH: 32.2 pg (ref 26.0–34.0)
MCHC: 32.9 g/dL (ref 30.0–36.0)
MCV: 97.8 fL (ref 80.0–100.0)
Monocytes Absolute: 0.8 10*3/uL (ref 0.1–1.0)
Monocytes Relative: 14 %
Neutro Abs: 3.2 10*3/uL (ref 1.7–7.7)
Neutrophils Relative %: 52 %
Platelets: 265 10*3/uL (ref 150–400)
RBC: 3.14 MIL/uL — ABNORMAL LOW (ref 3.87–5.11)
RDW: 21.7 % — ABNORMAL HIGH (ref 11.5–15.5)
WBC: 6.1 10*3/uL (ref 4.0–10.5)
nRBC: 0.5 % — ABNORMAL HIGH (ref 0.0–0.2)

## 2019-04-16 LAB — PROTIME-INR
INR: 1.1 (ref 0.8–1.2)
Prothrombin Time: 13.8 seconds (ref 11.4–15.2)

## 2019-04-16 MED ORDER — FENTANYL CITRATE (PF) 100 MCG/2ML IJ SOLN
INTRAMUSCULAR | Status: AC
  Filled 2019-04-16: qty 4

## 2019-04-16 MED ORDER — HEPARIN SOD (PORK) LOCK FLUSH 100 UNIT/ML IV SOLN
INTRAVENOUS | Status: AC
  Filled 2019-04-16: qty 5

## 2019-04-16 MED ORDER — SODIUM CHLORIDE 0.9 % IV SOLN
INTRAVENOUS | Status: DC
  Administered 2019-04-16: 08:00:00 via INTRAVENOUS

## 2019-04-16 MED ORDER — MIDAZOLAM HCL 5 MG/5ML IJ SOLN
INTRAMUSCULAR | Status: AC
  Filled 2019-04-16: qty 5

## 2019-04-16 MED ORDER — MIDAZOLAM HCL 5 MG/5ML IJ SOLN
INTRAMUSCULAR | Status: AC | PRN
  Administered 2019-04-16 (×3): 1 mg via INTRAVENOUS

## 2019-04-16 MED ORDER — FENTANYL CITRATE (PF) 100 MCG/2ML IJ SOLN
INTRAMUSCULAR | Status: AC | PRN
  Administered 2019-04-16: 25 ug via INTRAVENOUS
  Administered 2019-04-16: 50 ug via INTRAVENOUS

## 2019-04-16 NOTE — Procedures (Signed)
Pre-procedure Diagnosis: Anemia of uncertain etiology Post-procedure Diagnosis: Same  Technically successful CT guided bone marrow aspiration and biopsy of left iliac crest.   Complications: None Immediate  EBL: None  Signed: Sandi Mariscal Pager: [phone] 04/16/2019, 9:34 AM

## 2019-04-16 NOTE — Procedures (Signed)
Chief Complaint: Anemia of uncertain etiology.  Referring Physician(s): Brahmanday,Govinda R  Patient Status: [facility] - Out-pt  History of Present Illness: Carla [name] is a 68 y.o. female with no significant past medical history who presents today for CT-guided bone marrow biopsy for evaluation of anemia of uncertain etiology.  Patient is unaccompanied and serves as her historian.  Patient admits to a minimal amount of fatigue but is otherwise without complaint.  Specifically, no chest pain, shortness of breath, fever or chills.  No bloody or melanotic stools.  Past Medical History:  Diagnosis Date  . Allergic rhinitis, cause unspecified   . Anemia, unspecified   . Carpal tunnel syndrome   . Cystitis, unspecified   . Family history of osteoporosis   . PONV (postoperative nausea and vomiting)   . Postnasal drip   . Syncope and collapse     Past Surgical History:  Procedure Laterality Date  . CARPAL TUNNEL RELEASE    . COLONOSCOPY  2005  . COLONOSCOPY WITH PROPOFOL N/A 07/31/2015   Procedure: COLONOSCOPY WITH PROPOFOL;  Surgeon: Ronald Lobo, MD;  Location: [facility];  Service: Endoscopy;  Laterality: N/A;  . DIAGNOSTIC LAPAROSCOPY     dx lack of pregnancy   . EYE SURGERY     lasik, cataract, prk,yag procedure    Allergies: Codeine  Medications: Prior to Admission medications   Medication Sig Start Date End Date Taking? Authorizing Provider  BIOTIN PO Take 1 tablet by mouth daily.    [provider]  Calcium Carb-Cholecalciferol (CALCIUM 600+D3 PO) Take by mouth.    [provider]  cetirizine (ZYRTEC) 10 MG tablet Take 10 mg by mouth daily as needed for allergies.     [provider]  levothyroxine (SYNTHROID) 25 MCG tablet TAKE 1 TABLET BY MOUTH ONCE A DAY. TAKE ON AN EMPTY STOMACH WITH A GLASS OF WATER ATLEAST 30-60 MIN BEFORE BREAKFAST 03/08/19   [name], [name], MD     Family History  Problem Relation Age of Onset  . Osteoporosis  Mother   . Stroke Mother   . Coronary artery disease Father   . Stroke Father 3  . Diabetes Other        Aunts and uncles  . Breast cancer Paternal Aunt   . Breast cancer Maternal Aunt   . Arthritis/Rheumatoid Child     Social History   Socioeconomic History  . Marital status: Married    Spouse name: Not on file  . Number of children: Not on file  . Years of education: Not on file  . Highest education level: Not on file  Occupational History  . Not on file  Social Needs  . Financial resource strain: Not on file  . Food insecurity    Worry: Not on file    Inability: Not on file  . Transportation needs    Medical: Not on file    Non-medical: Not on file  Tobacco Use  . Smoking status: Never Smoker  . Smokeless tobacco: Never Used  Substance and Sexual Activity  . Alcohol use: Yes    Alcohol/week: 0.0 standard drinks    Comment: Rare  . Drug use: No  . Sexual activity: Not Currently  Lifestyle  . Physical activity    Days per week: Not on file    Minutes per session: Not on file  . Stress: Not on file  Relationships  . Social Herbalist on phone: Not on file    Gets  together: Not on file    Attends religious service: Not on file    Active member of club or organization: Not on file    Attends meetings of clubs or organizations: Not on file    Relationship status: Not on file  Other Topics Concern  . Not on file  Social History Narrative   No regular exercise      Drinks lots of [employer]          ECOG Status: 0 - Asymptomatic  Review of Systems: A 12 point ROS discussed and pertinent positives are indicated in the HPI above.  All other systems are negative.  Review of Systems  Constitutional: Positive for fatigue. Negative for chills and fever.  Respiratory: Negative.   Cardiovascular: Negative.   Gastrointestinal: Negative for blood in stool.    Vital Signs: BP 134/78   Pulse 70   Temp 98.1 F (36.7 C) (Oral)   Ht 5' 4" (1.626 m)    Wt 71.2 kg   SpO2 95%   BMI 26.95 kg/m   Physical Exam Vitals signs and nursing note reviewed. Exam conducted with a chaperone present.  Constitutional:      Appearance: Normal appearance.  HENT:     Head: Normocephalic and atraumatic.  Cardiovascular:     Rate and Rhythm: Normal rate and regular rhythm.  Pulmonary:     Effort: Pulmonary effort is normal.     Breath sounds: Normal breath sounds.  Neurological:     Mental Status: She is alert.  Psychiatric:        Mood and Affect: Mood normal.        Behavior: Behavior normal.        Thought Content: Thought content normal.        Judgment: Judgment normal.     Imaging: Mm 3d Screen Breast Bilateral  Result Date: 03/23/2019 CLINICAL DATA:  Screening. EXAM: DIGITAL SCREENING BILATERAL MAMMOGRAM WITH TOMO AND CAD COMPARISON:  Previous exam(s). ACR Breast Density Category b: There are scattered areas of fibroglandular density. FINDINGS: There are no findings suspicious for malignancy. Images were processed with CAD. IMPRESSION: No mammographic evidence of malignancy. A result letter of this screening mammogram will be mailed directly to the patient. RECOMMENDATION: Screening mammogram in one year. (Code:SM-B-01Y) BI-RADS CATEGORY  1: Negative. Electronically Signed   By: Fidela Salisbury M.D.   On: 03/23/2019 13:47    Labs:  CBC: Recent Labs    03/02/19 1101 03/26/19 1547 04/16/19 0825  WBC 7.8 7.2 6.1  HGB 10.2* 9.5* 10.1*  HCT 29.8* 29.1* 30.7*  PLT 250.0 256 265    COAGS: No results for input(s): INR, APTT in the last 8760 hours.  BMP: Recent Labs    03/02/19 1101  NA 138  K 4.2  CL 103  CO2 27  GLUCOSE 88  BUN 17  CALCIUM 9.1  CREATININE 0.82    LIVER FUNCTION TESTS: Recent Labs    03/02/19 1101  BILITOT 0.7  AST 14  ALT 12  ALKPHOS 44  PROT 7.4  ALBUMIN 4.2    TUMOR MARKERS: No results for input(s): AFPTM, CEA, CA199, CHROMGRNA in the last 8760 hours.  Assessment and Plan:  Carla Marshman  [name] is a 68 y.o. female with no significant past medical history who presents today for CT-guided bone marrow biopsy for evaluation of anemia of uncertain etiology.  Patient is unaccompanied and serves as her historian.  Patient admits to a minimal amount of fatigue but is otherwise without  complaint.    Risks and benefits of CT-guided bone marrow biopsy and aspiration was discussed with the patient and/or patient's family including, but not limited to bleeding, infection, damage to adjacent structures or low yield requiring additional tests.  All of the questions were answered and there is agreement to proceed.  Consent signed and in chart.    Thank you for this interesting consult.  I greatly enjoyed meeting Dyanara Cozza Ausmus and look forward to participating in their care.  A copy of this report was sent to the requesting provider on this date.  Electronically Signed: Sandi Mariscal, MD 04/16/2019, 8:44 AM   I spent a total of 15 Minutes in face to face in clinical consultation, greater than 50% of which was counseling/coordinating care for CT-guided bone marrow biopsy and aspiration

## 2019-04-16 NOTE — Discharge Instructions (Signed)
Bone Marrow Aspiration and Bone Marrow Biopsy, Adult, Care After °This sheet gives you information about how to care for yourself after your procedure. Your health care provider [place] also give you more specific instructions. If you have problems or questions, contact your health care provider. °What can I expect after the procedure? °After the procedure, it is common to have: °· Mild pain and tenderness. °· Swelling. °· Bruising. °Follow these instructions at home: °Puncture site care ° °  ° °· Follow instructions from your health care provider about how to take care of the puncture site. Make sure you: °? Wash your hands with soap and water before you change your bandage (dressing). If soap and water are not available, use hand sanitizer. °? Change your dressing as told by your health care provider. °· Check your puncture site every day for signs of infection. Check for: °? More redness, swelling, or pain. °? More fluid or blood. °? Warmth. °? Pus or a bad smell. °General instructions °· Take over-the-counter and prescription medicines only as told by your health care provider. °· Do not take baths, swim, or use a hot tub until your health care provider approves. Ask if you can take a shower or have a sponge bath. °· Return to your normal activities as told by your health care provider. Ask your health care provider what activities are safe for you. °· Do not drive for 24 hours if you were given a medicine to help you relax (sedative) during your procedure. °· Keep all follow-up visits as told by your health care provider. This is important. °Contact a health care provider if: °· Your pain is not controlled with medicine. °Get help right away if: °· You have a fever. °· You have more redness, swelling, or pain around the puncture site. °· You have more fluid or blood coming from the puncture site. °· Your puncture site feels warm to the touch. °· You have pus or a bad smell coming from the puncture site. °These  symptoms [place] represent a serious problem that is an emergency. Do not wait to see if the symptoms will go away. Get medical help right away. Call your local emergency services (911 in the [place] Do not drive yourself to the hospital. °Summary °· After the procedure, it is common to have mild pain, tenderness, swelling, and bruising. °· Follow instructions from your health care provider about how to take care of the puncture site. °· Get help right away if you have any symptoms of infection or if you have more blood or fluid coming from the puncture site. °This information is not intended to replace advice given to you by your health care provider. Make sure you discuss any questions you have with your health care provider. °Document Released: 04/02/2005 Document Revised: 12/27/2017 Document Reviewed: 02/25/2016 °Elsevier Patient Education © 2020 [employer] ° ° ° ° °Moderate Conscious Sedation, Adult, Care After °These instructions provide you with information about caring for yourself after your procedure. Your health care provider [place] also give you more specific instructions. Your treatment has been planned according to current medical practices, but problems sometimes occur. Call your health care provider if you have any problems or questions after your procedure. °What can I expect after the procedure? °After your procedure, it is common: °· To feel sleepy for several hours. °· To feel clumsy and have poor balance for several hours. °· To have poor judgment for several hours. °· To vomit if you eat too soon. °  Follow these instructions at home: °For at least 24 hours after the procedure: ° °· Do not: °? Participate in activities where you could fall or become injured. °? Drive. °? Use heavy machinery. °? Drink alcohol. °? Take sleeping pills or medicines that cause drowsiness. °? Make important decisions or sign legal documents. °? Take care of children on your own. °· Rest. °Eating and drinking °· Follow the  diet recommended by your health care provider. °· If you vomit: °? Drink water, juice, or soup when you can drink without vomiting. °? Make sure you have little or no nausea before eating solid foods. °General instructions °· Have a responsible adult stay with you until you are awake and alert. °· Take over-the-counter and prescription medicines only as told by your health care provider. °· If you smoke, do not smoke without supervision. °· Keep all follow-up visits as told by your health care provider. This is important. °Contact a health care provider if: °· You keep feeling nauseous or you keep vomiting. °· You feel light-headed. °· You develop a rash. °· You have a fever. °Get help right away if: °· You have trouble breathing. °This information is not intended to replace advice given to you by your health care provider. Make sure you discuss any questions you have with your health care provider. °Document Released: 07/04/2013 Document Revised: 08/26/2017 Document Reviewed: 01/03/2016 °Elsevier Patient Education © 2020 [employer] ° °

## 2019-04-20 ENCOUNTER — Other Ambulatory Visit: Payer: Self-pay

## 2019-04-23 ENCOUNTER — Encounter (HOSPITAL_COMMUNITY): Payer: Self-pay | Admitting: Internal Medicine

## 2019-04-23 ENCOUNTER — Inpatient Hospital Stay (HOSPITAL_BASED_OUTPATIENT_CLINIC_OR_DEPARTMENT_OTHER): Payer: PPO | Admitting: Internal Medicine

## 2019-04-23 ENCOUNTER — Inpatient Hospital Stay: Payer: PPO

## 2019-04-23 ENCOUNTER — Other Ambulatory Visit: Payer: Self-pay

## 2019-04-23 DIAGNOSIS — E039 Hypothyroidism, unspecified: Secondary | ICD-10-CM

## 2019-04-23 DIAGNOSIS — D4620 Refractory anemia with excess of blasts, unspecified: Secondary | ICD-10-CM

## 2019-04-23 DIAGNOSIS — D649 Anemia, unspecified: Secondary | ICD-10-CM | POA: Diagnosis not present

## 2019-04-23 DIAGNOSIS — Z803 Family history of malignant neoplasm of breast: Secondary | ICD-10-CM

## 2019-04-23 DIAGNOSIS — Z79899 Other long term (current) drug therapy: Secondary | ICD-10-CM | POA: Diagnosis not present

## 2019-04-23 NOTE — Progress Notes (Signed)
Discuss bone marrow biopsy results.

## 2019-04-23 NOTE — Progress Notes (Signed)
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  female is here for follow-up to review the results of her bone marrow biopsy that was ordered for chronic mild anemia.  Patient admits to mild fatigue; otherwise denies any significant symptoms.  Review of Systems  Constitutional: Positive for malaise/fatigue. Negative for chills, diaphoresis, fever and weight loss.  HENT: Negative for nosebleeds and sore throat.   Eyes: Negative for double vision.  Respiratory: Negative for cough, hemoptysis, sputum production, shortness of breath and wheezing.   Cardiovascular: Negative for chest pain, palpitations, orthopnea and leg swelling.  Gastrointestinal: Negative for abdominal pain, blood in stool, constipation, diarrhea, heartburn, melena, nausea and vomiting.  Genitourinary: Negative for dysuria, frequency and urgency.  Musculoskeletal: Negative for back pain and joint pain.  Skin: Negative.  Negative for itching and rash.  Neurological: Negative for dizziness, tingling, focal weakness, weakness and headaches.  Endo/Heme/Allergies: Does not bruise/bleed easily.  Psychiatric/Behavioral: Negative for depression. The patient is not nervous/anxious and does not have insomnia.     MEDICAL HISTORY:  Past Medical History:  Diagnosis Date  . Allergic rhinitis, cause unspecified   . Anemia, unspecified   . Carpal  tunnel syndrome   . Cystitis, unspecified   . Family history of osteoporosis   . PONV (postoperative nausea and vomiting)   . Postnasal drip   . Syncope and collapse     SURGICAL HISTORY: Past Surgical History:  Procedure Laterality Date  . CARPAL TUNNEL RELEASE    . COLONOSCOPY  2005  . COLONOSCOPY WITH PROPOFOL N/A 07/31/2015   Procedure: COLONOSCOPY WITH PROPOFOL;  Surgeon: Ronald Lobo, MD;  Location: [facility];  Service: Endoscopy;  Laterality: N/A;  . DIAGNOSTIC LAPAROSCOPY     dx lack of pregnancy   . EYE SURGERY     lasik, cataract, prk,yag procedure    SOCIAL HISTORY: Social History   Socioeconomic History  . Marital status: Married    Spouse name: Not on file  . Number of children: Not on file  . Years of education: Not on file  . Highest education level: Not on file  Occupational History  . Not on file  Social Needs  . Financial resource strain: Not on file  . Food insecurity    Worry: Not on file    Inability: Not on file  . Transportation needs    Medical: Not on file    Non-medical: Not on file  Tobacco Use  . Smoking status: Never Smoker  . Smokeless tobacco: Never Used  Substance and Sexual Activity  . Alcohol use: Yes    Alcohol/week: 0.0 standard drinks    Comment: Rare  . Drug use: No  . Sexual activity: Not Currently  Lifestyle  . Physical activity    Days per week: Not on file    Minutes per session: Not on file  . Stress: Not on file  Relationships  . Social Herbalist on phone: Not  on file    Gets together: Not on file    Attends religious service: Not on file    Active member of club or organization: Not on file    Attends meetings of clubs or organizations: Not on file    Relationship status: Not on file  . Intimate partner violence    Fear of current or ex partner: Not on file    Emotionally abused: Not on file    Physically abused: Not on file    Forced sexual activity: Not on file  Other Topics Concern  .  Not on file  Social History Narrative   No regular exercise      Drinks lots of [employer]epsi          FAMILY HISTORY: Family History  Problem Relation Age of Onset  . Osteoporosis Mother   . Stroke Mother   . Coronary artery disease Father   . Stroke Father 81  . Diabetes Other        Aunts and uncles  . Breast cancer Paternal Aunt   . Breast cancer Maternal Aunt   . Arthritis/Rheumatoid Child     ALLERGIES:  is allergic to codeine.  MEDICATIONS:  Current Outpatient Medications  Medication Sig Dispense Refill  . BIOTIN PO Take 1 tablet by mouth daily.    . Calcium Carb-Cholecalciferol (CALCIUM 600+D3 PO) Take by mouth.    . cetirizine (ZYRTEC) 10 MG tablet Take 10 mg by mouth daily as needed for allergies.     Marland Kitchen levothyroxine (SYNTHROID) 25 MCG tablet TAKE 1 TABLET BY MOUTH ONCE A DAY. TAKE ON AN EMPTY STOMACH WITH A GLASS OF WATER ATLEAST 30-60 MIN BEFORE BREAKFAST 90 tablet 3   No current facility-administered medications for this visit.       PHYSICAL EXAMINATION:   Vitals:   04/23/19 1451  BP: (!) 147/75  Pulse: 70  Resp: 16  Temp: 97.8 F (36.6 C)   Filed Weights   04/23/19 1451  Weight: 157 lb 3.2 oz (71.3 kg)    Physical Exam  Constitutional: She is oriented to person, place, and time and well-developed, well-nourished, and in no distress.  HENT:  Head: Normocephalic and atraumatic.  Mouth/Throat: Oropharynx is clear and moist. No oropharyngeal exudate.  Eyes: Pupils are equal, round, and reactive to light.  Neck: Normal range of motion. Neck supple.  Cardiovascular: Normal rate and regular rhythm.  Pulmonary/Chest: No respiratory distress. She has no wheezes.  Abdominal: Soft. Bowel sounds are normal. She exhibits no distension and no mass. There is no abdominal tenderness. There is no rebound and no guarding.  Musculoskeletal: Normal range of motion.        General: No tenderness or edema.  Neurological: She is alert and oriented to person, place,  and time.  Skin: Skin is warm.  Psychiatric: Affect normal.    LABORATORY DATA:  I have reviewed the data as listed Lab Results  Component Value Date   WBC 6.1 04/16/2019   HGB 10.1 (L) 04/16/2019   HCT 30.7 (L) 04/16/2019   MCV 97.8 04/16/2019   PLT 265 04/16/2019   Recent Labs    03/02/19 1101  NA 138  K 4.2  CL 103  CO2 27  GLUCOSE 88  BUN 17  CREATININE 0.82  CALCIUM 9.1  PROT 7.4  ALBUMIN 4.2  AST 14  ALT 12  ALKPHOS 44  BILITOT 0.7     Ct Bone Marrow Biopsy & Aspiration  Result Date: 04/16/2019 INDICATION: Anemia  of uncertain etiology. Please perform CT-guided bone marrow biopsy for tissue diagnostic purposes. EXAM: CT-GUIDED BONE MARROW BIOPSY AND ASPIRATION MEDICATIONS: None ANESTHESIA/SEDATION: Fentanyl 75 mcg IV; Versed 3 mg IV Sedation Time: 24 Minutes; The patient was continuously monitored during the procedure by the interventional radiology nurse under my direct supervision. COMPLICATIONS: None immediate. PROCEDURE: Informed consent was obtained from the patient following an explanation of the procedure, risks, benefits and alternatives. The patient understands, agrees and consents for the procedure. All questions were addressed. A time out was performed prior to the initiation of the procedure. The patient was positioned prone and non-contrast localization CT was performed of the pelvis to demonstrate the iliac marrow spaces. The operative site was prepped and draped in the usual sterile fashion. Under sterile conditions and local anesthesia, a 22 gauge spinal needle was utilized for procedural planning. Next, an 11 gauge coaxial bone biopsy needle was advanced into the left iliac marrow space. Needle position was confirmed with CT imaging. Initially, bone marrow aspiration was performed. Next, a bone marrow biopsy was obtained with the 11 gauge outer bone marrow device. The 11 gauge coaxial bone biopsy needle was re-advanced into two slightly different locations  within the left iliac marrow space, positioning was confirmed and an additional bone marrow biopsies were obtained. The needle was removed intact. Hemostasis was obtained with compression and a dressing was placed. The patient tolerated the procedure well without immediate post procedural complication. IMPRESSION: Successful CT guided left iliac bone marrow aspiration and core biopsy. Electronically Signed   By: Sandi Mariscal M.D.   On: 04/16/2019 10:02    MDS (myelodysplastic syndrome), low grade ([place]) # Low grade MDS- with ringed sideroblasts; no blasts. Normal cytogenetics.R-IPSS- Very Low risk.  Hemoglobin is 10/normal white count normal platelets.  # Long discussion the patient/husband regarding above diagnosis. Also, discussed the risk of progression high-grade MDS/transformation to acute leukemia.  Given the low IPSS score-risk of any acute events is quite low.  She was given a copy of her bone marrow biopsy report  # Discussed use of erythropoietin stimulating agents like Aranesp/Retacrit to stimulate the bone marrow.  Patient and husband understand that this is not curative; treatments are palliative/control further deterioration of anemia. If patient does not respond to erythropoietin stimulating agent-other option would be luspatercept.    #As patient is fairly asymptomatic at this time I would recommend surveillance.  Rule out secondary causes for dyserythropoiesis/ringed sideroblasts. We will also check pyridoxine levels/copper/zinc/erythropoietin levels.  If this is confirmed as myelodysplastic syndrome-then I would recommend ordering NGS.  # DISPOSITION: # Labs today # follow up in 4 weeks- MD/lab-cbc-Dr.B  Cc; dr.Towers.   All questions were answered. The patient knows to call the clinic with any problems, questions or concerns.   Cammie Sickle, MD 04/23/2019 9:29 PM

## 2019-04-23 NOTE — Assessment & Plan Note (Addendum)
#  Low grade MDS- with ringed sideroblasts; no blasts. Normal cytogenetics.R-IPSS- Very Low risk.  Hemoglobin is 10/normal white count normal platelets.  # Long discussion the patient/husband regarding above diagnosis. Also, discussed the risk of progression high-grade MDS/transformation to acute leukemia.  Given the low IPSS score-risk of any acute events is quite low.  She was given a copy of her bone marrow biopsy report  # Discussed use of erythropoietin stimulating agents like Aranesp/Retacrit to stimulate the bone marrow.  Patient and husband understand that this is not curative; treatments are palliative/control further deterioration of anemia. If patient does not respond to erythropoietin stimulating agent-other option would be luspatercept.    #As patient is fairly asymptomatic at this time I would recommend surveillance.  Rule out secondary causes for dyserythropoiesis/ringed sideroblasts. We will also check pyridoxine levels/copper/zinc/erythropoietin levels.  If this is confirmed as myelodysplastic syndrome-then I would recommend ordering NGS.  # DISPOSITION: # Labs today # follow up in 4 weeks- MD/lab-cbc-Dr.B  Cc; dr.Towers.

## 2019-04-24 ENCOUNTER — Telehealth: Payer: Self-pay | Admitting: Family Medicine

## 2019-04-24 LAB — ERYTHROPOIETIN: Erythropoietin: 60.2 m[IU]/mL — ABNORMAL HIGH (ref 2.6–18.5)

## 2019-04-24 NOTE — Telephone Encounter (Signed)
Called Dr. Silvano Rusk office and advised them that the [place] note and labs were done by oncologist and they have to request records from them. I gave them her oncologist name and they will call them to request records

## 2019-04-24 NOTE — Telephone Encounter (Signed)
Carla [name] with Dr Tula Nakayama dental office called today and stated that the patient has a appointment with them tomorrow and patient stated that she was diagnosed with lukemia yesterday. They are requesting any labs that [facility] pertain to this and would like them before the patient's appointment tomorrow at 2:50pm     [phone] Phone- [phone]

## 2019-04-25 LAB — ZINC: Zinc: 97 ug/dL (ref 56–134)

## 2019-04-25 LAB — VITAMIN B6: Vitamin B6: 6.2 ug/L (ref 2.0–32.8)

## 2019-04-25 LAB — COPPER, SERUM: Copper: 127 ug/dL (ref 72–166)

## 2019-05-01 ENCOUNTER — Telehealth: Payer: Self-pay

## 2019-05-01 MED ORDER — BUSPIRONE HCL 15 MG PO TABS: 7.5000 mg | ORAL_TABLET | Freq: Two times a day (BID) | ORAL | 5 refills | Status: DC

## 2019-05-01 NOTE — Telephone Encounter (Signed)
Pt notified of all of Dr. Marliss Coots comments and instructions and verbalized understanding

## 2019-05-01 NOTE — Telephone Encounter (Signed)
I sent buspar 15 mg to take 1/2 pills bid  If it works well later we can increase dose if needed  If any intolerable side effects or problems - hold med and call If her symptoms worsen or if she feels depressed-do the same   Ask if she would like a counseling referral as well- this is done by phone or virtual and can be helpful   Good self care and exercise are also helpful   Keep me posted

## 2019-05-01 NOTE — Telephone Encounter (Signed)
Pt said that Dr Glori Bickers is aware of her recent dx (leukemia) and pt has never taken anti anxiety med before but pt feels jumpy inside and needs something mild to calm her. No SI/HI. Pt said had virtual annual exam on 03/08/19. Pt said her sister has been sick as well and pt has been dealing with that. Pt does not want to schedule virtual visit and request note sent to Dr Glori Bickers requesting mild med for anxiety. [place].

## 2019-05-15 ENCOUNTER — Telehealth: Payer: Self-pay | Admitting: Family Medicine

## 2019-05-15 DIAGNOSIS — D4620 Refractory anemia with excess of blasts, unspecified: Secondary | ICD-10-CM

## 2019-05-15 NOTE — Telephone Encounter (Signed)
Order done Will send to [facility]

## 2019-05-15 NOTE — Telephone Encounter (Signed)
Patient was referred to Dr.Brahmanday and patient would like to get a second opinion.  Patient said she and her family have discussed it and she'd like to be referred to [facility] at [place].

## 2019-05-18 ENCOUNTER — Other Ambulatory Visit: Payer: Self-pay

## 2019-05-21 ENCOUNTER — Other Ambulatory Visit: Payer: Self-pay

## 2019-05-21 ENCOUNTER — Inpatient Hospital Stay: Payer: PPO | Attending: Internal Medicine

## 2019-05-21 ENCOUNTER — Inpatient Hospital Stay (HOSPITAL_BASED_OUTPATIENT_CLINIC_OR_DEPARTMENT_OTHER): Payer: PPO | Admitting: Internal Medicine

## 2019-05-21 ENCOUNTER — Encounter: Payer: Self-pay | Admitting: Internal Medicine

## 2019-05-21 VITALS — BP 135/80 | HR 69 | Temp 97.7°F | Resp 16 | Wt 155.0 lb

## 2019-05-21 DIAGNOSIS — R5381 Other malaise: Secondary | ICD-10-CM | POA: Diagnosis not present

## 2019-05-21 DIAGNOSIS — E039 Hypothyroidism, unspecified: Secondary | ICD-10-CM | POA: Diagnosis not present

## 2019-05-21 DIAGNOSIS — R5383 Other fatigue: Secondary | ICD-10-CM | POA: Insufficient documentation

## 2019-05-21 DIAGNOSIS — D4620 Refractory anemia with excess of blasts, unspecified: Secondary | ICD-10-CM

## 2019-05-21 DIAGNOSIS — Z79899 Other long term (current) drug therapy: Secondary | ICD-10-CM | POA: Insufficient documentation

## 2019-05-21 DIAGNOSIS — D469 Myelodysplastic syndrome, unspecified: Secondary | ICD-10-CM | POA: Insufficient documentation

## 2019-05-21 LAB — CBC WITH DIFFERENTIAL/PLATELET
Abs Immature Granulocytes: 0.15 10*3/uL — ABNORMAL HIGH (ref 0.00–0.07)
Basophils Absolute: 0.1 10*3/uL (ref 0.0–0.1)
Basophils Relative: 1 %
Eosinophils Absolute: 0.1 10*3/uL (ref 0.0–0.5)
Eosinophils Relative: 1 %
HCT: 28.3 % — ABNORMAL LOW (ref 36.0–46.0)
Hemoglobin: 9.2 g/dL — ABNORMAL LOW (ref 12.0–15.0)
Immature Granulocytes: 2 %
Lymphocytes Relative: 32 %
Lymphs Abs: 2.4 10*3/uL (ref 0.7–4.0)
MCH: 32.4 pg (ref 26.0–34.0)
MCHC: 32.5 g/dL (ref 30.0–36.0)
MCV: 99.6 fL (ref 80.0–100.0)
Monocytes Absolute: 0.7 10*3/uL (ref 0.1–1.0)
Monocytes Relative: 10 %
Neutro Abs: 4.2 10*3/uL (ref 1.7–7.7)
Neutrophils Relative %: 54 %
Platelets: 287 10*3/uL (ref 150–400)
RBC: 2.84 MIL/uL — ABNORMAL LOW (ref 3.87–5.11)
RDW: 21.8 % — ABNORMAL HIGH (ref 11.5–15.5)
WBC: 7.6 10*3/uL (ref 4.0–10.5)
nRBC: 0.7 % — ABNORMAL HIGH (ref 0.0–0.2)

## 2019-05-21 NOTE — Assessment & Plan Note (Addendum)
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 Normal cytogenetics.R-IPSS- Very Low risk.  Hemoglobin is 9.2/normal white count normal platelets.  #Again reviewed the natural history of low-grade MDS at length.  Again discussed the use of erythropoietin stimulating agent  to keep the hemoglobin above 10.  If patient does not respond to erythropoietin stimulating agent-other option would be luspatercept.    #Discussed getting [facility] heme/NGS testing-to look for targetable treatment options/also prognostication.  Patient interested.  Will order at next visit.  Patient is interested in a second opinion at [facility]/will get a referral PCP   # DISPOSITION: # follow up in 4 weeks- MD/lab-cbc; [facility]- Hem/NGS-Dr.B  Cc; dr.Towers.

## 2019-05-21 NOTE — Progress Notes (Signed)
[place] NOTE  Patient Care Team: [name], [name], MD as PCP - General  CHIEF COMPLAINTS/PURPOSE OF CONSULTATION: Anemia   Oncology History Overview Note   # July 2020-myelodysplastic syndrome with ringed sideroblasts-Normal karyotype; no blasts [IPSS R-Very low risk ~median survival 8.3 years]; iron studies M22 folic acid myeloma panel normal; pyridoxine levels/copper/zinc-WNL. Erythropoietin levels-60.  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  female history of low-grade MDS anemia with ring sideroblasts is here for follow-up.  Patient admits to mild fatigue otherwise denies any significant symptoms or shortness of breath.   No blood in stools or black or stools.  No nausea no vomiting.  No swelling in legs.  Review of Systems  Constitutional: Positive for malaise/fatigue. Negative for chills, diaphoresis, fever and weight loss.  HENT: Negative for nosebleeds and sore throat.   Eyes: Negative for double vision.  Respiratory: Negative for cough, hemoptysis, sputum production, shortness of breath and wheezing.   Cardiovascular: Negative for chest pain, palpitations, orthopnea and leg swelling.  Gastrointestinal: Negative for abdominal pain, blood in stool, constipation, diarrhea, heartburn, melena, nausea and vomiting.  Genitourinary: Negative for dysuria, frequency and urgency.  Musculoskeletal: Negative for back pain and joint pain.  Skin: Negative.  Negative for itching and rash.  Neurological: Negative for dizziness, tingling, focal weakness, weakness and headaches.  Endo/Heme/Allergies: Does not bruise/bleed easily.  Psychiatric/Behavioral: Negative for depression. The patient is not nervous/anxious and does not have insomnia.      MEDICAL HISTORY:  Past Medical History:  Diagnosis Date  . Allergic rhinitis, cause unspecified   . Anemia, unspecified   . Carpal tunnel syndrome   . Cystitis, unspecified   . Family history of osteoporosis   . PONV (postoperative nausea and vomiting)   . Postnasal drip   . Syncope and collapse     SURGICAL HISTORY: Past Surgical History:  Procedure Laterality Date  . CARPAL TUNNEL RELEASE    . COLONOSCOPY  2005  . COLONOSCOPY WITH PROPOFOL N/A 07/31/2015   Procedure: COLONOSCOPY WITH PROPOFOL;  Surgeon: Ronald Lobo, MD;  Location: [facility];  Service: Endoscopy;  Laterality: N/A;  . DIAGNOSTIC LAPAROSCOPY     dx lack of pregnancy   . EYE SURGERY     lasik, cataract, prk,yag procedure    SOCIAL HISTORY: Social History   Socioeconomic History  . Marital status: Married    Spouse name: Not on file  . Number of children: Not on file  . Years of education: Not on file  . Highest education level: Not on file  Occupational History  . Not on file  Social Needs  . Financial resource strain: Not on file  . Food insecurity    Worry: Not on file    Inability: Not on file  . Transportation needs    Medical: Not on file    Non-medical: Not on file  Tobacco Use  . Smoking status: Never Smoker  . Smokeless tobacco: Never Used  Substance and Sexual Activity  . Alcohol use: Yes    Alcohol/week: 0.0 standard drinks    Comment: Rare  . Drug use: No  . Sexual activity: Not Currently  Lifestyle  . Physical activity    Days per week: Not on file    Minutes per session: Not on  file  . Stress: Not on file  Relationships  . Social Herbalist on phone: Not on file    Gets together: Not on file    Attends religious service: Not on file    Active member of club or organization: Not on file    Attends meetings of clubs or organizations: Not on file    Relationship status: Not on file  . Intimate partner violence    Fear of current or ex partner: Not on  file    Emotionally abused: Not on file    Physically abused: Not on file    Forced sexual activity: Not on file  Other Topics Concern  . Not on file  Social History Narrative   No regular exercise      Drinks lots of [employer]          FAMILY HISTORY: Family History  Problem Relation Age of Onset  . Osteoporosis Mother   . Stroke Mother   . Coronary artery disease Father   . Stroke Father 70  . Diabetes Other        Aunts and uncles  . Breast cancer Paternal Aunt   . Breast cancer Maternal Aunt   . Arthritis/Rheumatoid Child     ALLERGIES:  is allergic to codeine.  MEDICATIONS:  Current Outpatient Medications  Medication Sig Dispense Refill  . BIOTIN PO Take 1 tablet by mouth daily.    . busPIRone (BUSPAR) 15 MG tablet Take 0.5 tablets (7.5 mg total) by mouth 2 (two) times daily. 30 tablet 5  . Calcium Carb-Cholecalciferol (CALCIUM 600+D3 PO) Take by mouth.    . cetirizine (ZYRTEC) 10 MG tablet Take 10 mg by mouth daily as needed for allergies.     Marland Kitchen levothyroxine (SYNTHROID) 25 MCG tablet TAKE 1 TABLET BY MOUTH ONCE A DAY. TAKE ON AN EMPTY STOMACH WITH A GLASS OF WATER ATLEAST 30-60 MIN BEFORE BREAKFAST 90 tablet 3   No current facility-administered medications for this visit.       PHYSICAL EXAMINATION:   Vitals:   05/21/19 1507  BP: 135/80  Pulse: 69  Resp: 16  Temp: 97.7 F (36.5 C)   Filed Weights   05/21/19 1507  Weight: 155 lb (70.3 kg)    Physical Exam  Constitutional: She is oriented to person, place, and time and well-developed, well-nourished, and in no distress.  HENT:  Head: Normocephalic and atraumatic.  Mouth/Throat: Oropharynx is clear and moist. No oropharyngeal exudate.  Eyes: Pupils are equal, round, and reactive to light.  Neck: Normal range of motion. Neck supple.  Cardiovascular: Normal rate and regular rhythm.  Pulmonary/Chest: No respiratory distress. She has no wheezes.  Abdominal: Soft. Bowel sounds are normal. She exhibits  no distension and no mass. There is no abdominal tenderness. There is no rebound and no guarding.  Musculoskeletal: Normal range of motion.        General: No tenderness or edema.  Neurological: She is alert and oriented to person, place, and time.  Skin: Skin is warm.  Psychiatric: Affect normal.    LABORATORY DATA:  I have reviewed the data as listed Lab Results  Component Value Date   WBC 7.6 05/21/2019   HGB 9.2 (L) 05/21/2019   HCT 28.3 (L) 05/21/2019   MCV 99.6 05/21/2019   PLT 287 05/21/2019   Recent Labs    03/02/19 1101  NA 138  K 4.2  CL 103  CO2 27  GLUCOSE 88  BUN 17  CREATININE 0.82  CALCIUM 9.1  PROT 7.4  ALBUMIN 4.2  AST 14  ALT 12  ALKPHOS 44  BILITOT 0.7     No results found.  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 Normal cytogenetics.R-IPSS- Very Low risk.  Hemoglobin is 9.2/normal white count normal platelets.  #Again reviewed the natural history of low-grade MDS at length.  Again discussed the use of erythropoietin stimulating agent  to keep the hemoglobin above 10.  If patient does not respond to erythropoietin stimulating agent-other option would be luspatercept.    #Discussed getting [facility] heme/NGS testing-to look for targetable treatment options/also prognostication.  Patient interested.  Will order at next visit.  Patient is interested in a second opinion at [facility]/will get a referral PCP   # DISPOSITION: # follow up in 4 weeks- MD/lab-cbc; [facility]- Hem/NGS-Dr.B  Cc; dr.Towers.   All questions were answered. The patient knows to call the clinic with any problems, questions or concerns.   Cammie Sickle, MD 05/21/2019 4:55 PM

## 2019-06-08 DIAGNOSIS — D469 Myelodysplastic syndrome, unspecified: Secondary | ICD-10-CM | POA: Diagnosis not present

## 2019-06-11 DIAGNOSIS — D4620 Refractory anemia with excess of blasts, unspecified: Secondary | ICD-10-CM | POA: Diagnosis not present

## 2019-06-11 DIAGNOSIS — D469 Myelodysplastic syndrome, unspecified: Secondary | ICD-10-CM | POA: Diagnosis not present

## 2019-06-18 ENCOUNTER — Ambulatory Visit: Payer: PPO | Admitting: Internal Medicine

## 2019-06-18 ENCOUNTER — Other Ambulatory Visit: Payer: PPO

## 2019-07-05 ENCOUNTER — Ambulatory Visit (INDEPENDENT_AMBULATORY_CARE_PROVIDER_SITE_OTHER): Payer: PPO

## 2019-07-05 DIAGNOSIS — Z23 Encounter for immunization: Secondary | ICD-10-CM | POA: Diagnosis not present

## 2019-07-06 ENCOUNTER — Other Ambulatory Visit: Payer: Self-pay

## 2019-07-06 ENCOUNTER — Encounter: Payer: Self-pay | Admitting: Internal Medicine

## 2019-07-06 NOTE — Progress Notes (Signed)
Patient pre screened today. She states she has been have a few symptons but does no know if her MDS is causing them, intermittent nausea, light headed, occasional "heart pounding".

## 2019-07-09 ENCOUNTER — Other Ambulatory Visit: Payer: Self-pay

## 2019-07-09 ENCOUNTER — Inpatient Hospital Stay: Payer: PPO | Attending: Internal Medicine

## 2019-07-09 ENCOUNTER — Inpatient Hospital Stay (HOSPITAL_BASED_OUTPATIENT_CLINIC_OR_DEPARTMENT_OTHER): Payer: PPO | Admitting: Internal Medicine

## 2019-07-09 DIAGNOSIS — Z8249 Family history of ischemic heart disease and other diseases of the circulatory system: Secondary | ICD-10-CM | POA: Insufficient documentation

## 2019-07-09 DIAGNOSIS — Z79899 Other long term (current) drug therapy: Secondary | ICD-10-CM | POA: Insufficient documentation

## 2019-07-09 DIAGNOSIS — D4620 Refractory anemia with excess of blasts, unspecified: Secondary | ICD-10-CM | POA: Diagnosis not present

## 2019-07-09 DIAGNOSIS — Z803 Family history of malignant neoplasm of breast: Secondary | ICD-10-CM | POA: Insufficient documentation

## 2019-07-09 DIAGNOSIS — R002 Palpitations: Secondary | ICD-10-CM | POA: Diagnosis not present

## 2019-07-09 DIAGNOSIS — R634 Abnormal weight loss: Secondary | ICD-10-CM | POA: Insufficient documentation

## 2019-07-09 DIAGNOSIS — R110 Nausea: Secondary | ICD-10-CM | POA: Diagnosis not present

## 2019-07-09 DIAGNOSIS — E039 Hypothyroidism, unspecified: Secondary | ICD-10-CM | POA: Diagnosis not present

## 2019-07-09 LAB — CBC WITH DIFFERENTIAL/PLATELET
Abs Immature Granulocytes: 0.22 10*3/uL — ABNORMAL HIGH (ref 0.00–0.07)
Basophils Absolute: 0.1 10*3/uL (ref 0.0–0.1)
Basophils Relative: 1 %
Eosinophils Absolute: 0.1 10*3/uL (ref 0.0–0.5)
Eosinophils Relative: 1 %
HCT: 28.7 % — ABNORMAL LOW (ref 36.0–46.0)
Hemoglobin: 9.4 g/dL — ABNORMAL LOW (ref 12.0–15.0)
Immature Granulocytes: 3 %
Lymphocytes Relative: 25 %
Lymphs Abs: 1.6 10*3/uL (ref 0.7–4.0)
MCH: 32.4 pg (ref 26.0–34.0)
MCHC: 32.8 g/dL (ref 30.0–36.0)
MCV: 99 fL (ref 80.0–100.0)
Monocytes Absolute: 0.7 10*3/uL (ref 0.1–1.0)
Monocytes Relative: 11 %
Neutro Abs: 3.8 10*3/uL (ref 1.7–7.7)
Neutrophils Relative %: 59 %
Platelets: 275 10*3/uL (ref 150–400)
RBC: 2.9 MIL/uL — ABNORMAL LOW (ref 3.87–5.11)
RDW: 23 % — ABNORMAL HIGH (ref 11.5–15.5)
WBC: 6.5 10*3/uL (ref 4.0–10.5)
nRBC: 1.1 % — ABNORMAL HIGH (ref 0.0–0.2)

## 2019-07-09 NOTE — Progress Notes (Signed)
[facility] NOTE  Patient Care Team: [name], [name], MD as PCP - General  CHIEF COMPLAINTS/PURPOSE OF CONSULTATION: Anemia   Oncology History Overview Note   # July 2020-myelodysplastic syndrome with ringed sideroblasts-Normal karyotype; no blasts [IPSS R-Very low risk ~median survival 8.3 years]; iron studies Y51 folic acid myeloma panel normal; pyridoxine levels/copper/zinc-WNL. Erythropoietin levels-60. II OPINION at [place] [Dr.DeCastro]  # [facility]/ NGS: TET2([id number])c.2524delT(p.Ser842GlnfsTer31) Exon 3 frame-shift SF3B1([id number])c.2098A>G(p.Lys700Glu) Exon 15 missense  DNMT3A(NM_[id number])c.2204A>G(p.Tyr735Cys) Exon 19 missense   #Mild hypothyroidism-on Synthroid.  # colonoscopy- 2016 [Dr.Bucinni];  DIAGNOSIS: MDS/low-grade with ringed sideroblasts  STAGE: Low       ;  GOALS: Control  CURRENT/MOST RECENT THERAPY : Surveillance    MDS (myelodysplastic syndrome), low grade ([place])  04/23/2019 Initial Diagnosis   MDS (myelodysplastic syndrome), low grade ([facility])      HISTORY OF PRESENTING ILLNESS:  Carla [name] 68 y.o.  female history of low-grade MDS anemia with ring sideroblasts is here for follow-up.  In the interim patient was evaluated at [facility] for second opinion.   Patient denies any worsening energy levels.  Denies any fevers or chills.  However complains of intermittent palpitations.  Complains of weight loss.  No loss of appetite.  Complains of mild nausea no vomiting..  Review of Systems  Constitutional: Positive for weight loss. Negative for chills, diaphoresis and fever.  HENT: Negative for nosebleeds and sore throat.   Eyes: Negative for double vision.  Respiratory: Negative for cough, hemoptysis, sputum production, shortness of breath and wheezing.   Cardiovascular: Positive for palpitations. Negative for chest pain, orthopnea and leg swelling.  Gastrointestinal: Positive for nausea. Negative for abdominal pain, blood in stool,  constipation, diarrhea, heartburn, melena and vomiting.  Genitourinary: Negative for dysuria, frequency and urgency.  Musculoskeletal: Negative for back pain and joint pain.  Skin: Negative.  Negative for itching and rash.  Neurological: Negative for dizziness, tingling, focal weakness, weakness and headaches.  Endo/Heme/Allergies: Does not bruise/bleed easily.  Psychiatric/Behavioral: Negative for depression. The patient is not nervous/anxious and does not have insomnia.     MEDICAL HISTORY:  Past Medical History:  Diagnosis Date  . Allergic rhinitis, cause unspecified   . Anemia, unspecified   . Carpal tunnel syndrome   . Cystitis, unspecified   . Family history of osteoporosis   . PONV (postoperative nausea and vomiting)   . Postnasal drip   . Syncope and collapse     SURGICAL HISTORY: Past Surgical History:  Procedure Laterality Date  . CARPAL TUNNEL RELEASE    . COLONOSCOPY  2005  . COLONOSCOPY WITH PROPOFOL N/A 07/31/2015   Procedure: COLONOSCOPY WITH PROPOFOL;  Surgeon: Ronald Lobo, MD;  Location: [facility];  Service: Endoscopy;  Laterality: N/A;  . DIAGNOSTIC LAPAROSCOPY     dx lack of pregnancy   . EYE SURGERY     lasik, cataract, prk,yag procedure    SOCIAL HISTORY: Social History   Socioeconomic History  . Marital status: Married    Spouse name: Not on file  . Number of children: Not on file  . Years of education: Not on file  . Highest education level: Not on file  Occupational History  . Not on file  Social Needs  . Financial resource strain: Not on file  . Food insecurity    Worry: Not on file    Inability: Not on file  . Transportation needs    Medical: Not on file    Non-medical: Not on file  Tobacco Use  .  Smoking status: Never Smoker  . Smokeless tobacco: Never Used  Substance and Sexual Activity  . Alcohol use: Yes    Alcohol/week: 0.0 standard drinks    Comment: Rare  . Drug use: No  . Sexual activity: Not Currently  Lifestyle   . Physical activity    Days per week: Not on file    Minutes per session: Not on file  . Stress: Not on file  Relationships  . Social Herbalist on phone: Not on file    Gets together: Not on file    Attends religious service: Not on file    Active member of club or organization: Not on file    Attends meetings of clubs or organizations: Not on file    Relationship status: Not on file  . Intimate partner violence    Fear of current or ex partner: Not on file    Emotionally abused: Not on file    Physically abused: Not on file    Forced sexual activity: Not on file  Other Topics Concern  . Not on file  Social History Narrative   No regular exercise      Drinks lots of [employer]eps[employer]          FAMILY HISTORY: Family History  Problem Relation Age of Onset  . Osteoporosis Mother   . Stroke Mother   . Coronary artery disease Father   . Stroke Father 77  . Diabetes Other        Aunts and uncles  . Breast cancer Paternal Aunt   . Breast cancer Maternal Aunt   . Arthritis/Rheumatoid Child     ALLERGIES:  is allergic to codeine.  MEDICATIONS:  Current Outpatient Medications  Medication Sig Dispense Refill  . BIOTIN PO Take 1 tablet by mouth daily.    . busPIRone (BUSPAR) 15 MG tablet Take 0.5 tablets (7.5 mg total) by mouth 2 (two) times daily. 30 tablet 5  . Calcium Carb-Cholecalciferol (CALCIUM 600+D3 PO) Take by mouth.    . cetirizine (ZYRTEC) 10 MG tablet Take 10 mg by mouth daily as needed for allergies.     Marland Kitchen levothyroxine (SYNTHROID) 25 MCG tablet TAKE 1 TABLET BY MOUTH ONCE A DAY. TAKE ON AN EMPTY STOMACH WITH A GLASS OF WATER ATLEAST 30-60 MIN BEFORE BREAKFAST 90 tablet 3   No current facility-administered medications for this visit.       PHYSICAL EXAMINATION:   There were no vitals filed for this visit. There were no vitals filed for this visit.  Physical Exam  Constitutional: She is oriented to person, place, and time and well-developed,  well-nourished, and in no distress.  HENT:  Head: Normocephalic and atraumatic.  Mouth/Throat: Oropharynx is clear and moist. No oropharyngeal exudate.  Eyes: Pupils are equal, round, and reactive to light.  Neck: Normal range of motion. Neck supple.  Cardiovascular: Normal rate and regular rhythm.  Pulmonary/Chest: No respiratory distress. She has no wheezes.  Abdominal: Soft. Bowel sounds are normal. She exhibits no distension and no mass. There is no abdominal tenderness. There is no rebound and no guarding.  Musculoskeletal: Normal range of motion.        General: No tenderness or edema.  Neurological: She is alert and oriented to person, place, and time.  Skin: Skin is warm.  Psychiatric: Affect normal.    LABORATORY DATA:  I have reviewed the data as listed Lab Results  Component Value Date   WBC 6.5 07/09/2019   HGB 9.4 (  L) 07/09/2019   HCT 28.7 (L) 07/09/2019   MCV 99.0 07/09/2019   PLT 275 07/09/2019   Recent Labs    03/02/19 1101  NA 138  K 4.2  CL 103  CO2 27  GLUCOSE 88  BUN 17  CREATININE 0.82  CALCIUM 9.1  PROT 7.4  ALBUMIN 4.2  AST 14  ALT 12  ALKPHOS 44  BILITOT 0.7     No results found.  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 Normal cytogenetics.R-IPSS- Very Low risk.  Hemoglobin is 9.4/normal white count normal platelets.  Patient noted to have- SF3B1 [which could make patient a candidate for luspatercept-see below].  I reviewed the labs/recommendations on [facility].  #Patient is fairly asymptomatic [weight loss/palpitations-see below].  Clinically I am not suspicious of patient's weight loss/palpitations attributed to patient's diagnosis of MDS/mild chronic anemia.  Patient has adequate energy levels/not short of breath with exertion.  I would recommend continued surveillance at this time.  Again discussed that erythropoietin injections would be the first line of treatment/if patient does  not respond; then Luspatercept would be the next option.  # weight loss-about 4 pounds in the last 3 months.  Again not suspicious of MDS to be the etiology.  Recommend dietary referral  #Palpitations-unclear etiology not like related to anemia.  Recommend cardiology evaluation if this is persistent.  Defer to PCP for further recommendations.  # DISPOSITION: # follow up in 6 weeks- MD/lab-cbc/bmpDr.B  Cc; Dr.Towers.   All questions were answered. The patient knows to call the clinic with any problems, questions or concerns.   Cammie Sickle, MD 07/09/2019 12:39 PM

## 2019-07-09 NOTE — Assessment & Plan Note (Addendum)
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 Normal cytogenetics.R-IPSS- Very Low risk.  Hemoglobin is 9.4/normal white count normal platelets.  Patient noted to have- SF3B1 [which could make patient a candidate for luspatercept-see below].  I reviewed the labs/recommendations on [facility].  #Patient is fairly asymptomatic [weight loss/palpitations-see below].  Clinically I am not suspicious of patient's weight loss/palpitations attributed to patient's diagnosis of MDS/mild chronic anemia.  Patient has adequate energy levels/not short of breath with exertion.  I would recommend continued surveillance at this time.  Again discussed that erythropoietin injections would be the first line of treatment/if patient does not respond; then Luspatercept would be the next option.  # weight loss-about 4 pounds in the last 3 months.  Again not suspicious of MDS to be the etiology.  Recommend dietary referral  #Palpitations-unclear etiology not like related to anemia.  Recommend cardiology evaluation if this is persistent.  Defer to PCP for further recommendations.  # DISPOSITION: # follow up in 6 weeks- MD/lab-cbc/bmpDr.B  Cc; Dr.Towers.

## 2019-07-30 ENCOUNTER — Telehealth: Payer: Self-pay | Admitting: *Deleted

## 2019-07-30 NOTE — Telephone Encounter (Signed)
Patient called reporting that she Carla [name] have been exposed to Shingles and is asking if she should get the Shingrix shot since she has been diagnosed with MDS. Please advise

## 2019-07-30 NOTE — Telephone Encounter (Signed)
Patient is okay to have Shingrix vaccine [as this is a dead vaccine] with a diagnosis of MDS.  Carla [name] ,please inform patient GB

## 2019-07-30 NOTE — Telephone Encounter (Signed)
Patient informed ok to get Shingrix vaccine

## 2019-08-03 ENCOUNTER — Encounter: Admit: 2019-08-03 | Discharge: 2019-08-03 | Payer: MEDICARE | Primary: Family

## 2019-08-06 ENCOUNTER — Encounter: Admit: 2019-08-06 | Discharge: 2019-08-06 | Payer: MEDICARE | Primary: Family

## 2019-08-17 ENCOUNTER — Other Ambulatory Visit: Payer: Self-pay

## 2019-08-17 ENCOUNTER — Telehealth: Payer: Self-pay

## 2019-08-17 ENCOUNTER — Encounter: Payer: Self-pay | Admitting: Internal Medicine

## 2019-08-17 MED ORDER — VALACYCLOVIR HCL 1 G PO TABS: 1000.0000 mg | ORAL_TABLET | Freq: Two times a day (BID) | ORAL | 0 refills | Status: DC

## 2019-08-17 NOTE — Telephone Encounter (Signed)
Pt notified of Dr. Marliss Coots comments and instructions and verbalized understanding. She will start med and keep [place] posted

## 2019-08-17 NOTE — Telephone Encounter (Signed)
It [place] or [place] not be shingles related but on spec I went ahead and qued up valtrex to send to her pharmacy to be on the safe side Let me know if any problems  Keep [place] posted re: symptoms

## 2019-08-17 NOTE — Telephone Encounter (Signed)
Patient called stating that about 2 1/2 weeks ago she was around her sister and then found out that sister had shingles while patient was around her but they did not know at that time. Patient also received her 1st Shingrix vaccine the day after been around her sister on 07/31/2019 (per our vaccine report). Yesterday-08/16/2019, patient noticed a bump behind her left ear that looks like a white/blister like and some tenderness is present around that ear. Patient states a while back she had the same thing happened behind that same ear and also at that time had 1 blister on her shoulder and she saw dermatologist who treated her for shingles. Patient is wondering if this is shingles again based on the feeling and same presentation as before. Patient has MDS and would rather not come in if possible. CB [phone]

## 2019-08-20 ENCOUNTER — Other Ambulatory Visit: Payer: Self-pay | Admitting: *Deleted

## 2019-08-20 ENCOUNTER — Other Ambulatory Visit: Payer: Self-pay

## 2019-08-20 ENCOUNTER — Inpatient Hospital Stay: Payer: PPO | Attending: Internal Medicine

## 2019-08-20 ENCOUNTER — Inpatient Hospital Stay (HOSPITAL_BASED_OUTPATIENT_CLINIC_OR_DEPARTMENT_OTHER): Payer: PPO | Admitting: Internal Medicine

## 2019-08-20 DIAGNOSIS — R5383 Other fatigue: Secondary | ICD-10-CM | POA: Insufficient documentation

## 2019-08-20 DIAGNOSIS — D4620 Refractory anemia with excess of blasts, unspecified: Secondary | ICD-10-CM

## 2019-08-20 DIAGNOSIS — E039 Hypothyroidism, unspecified: Secondary | ICD-10-CM | POA: Diagnosis not present

## 2019-08-20 DIAGNOSIS — Z79899 Other long term (current) drug therapy: Secondary | ICD-10-CM | POA: Diagnosis not present

## 2019-08-20 DIAGNOSIS — Z803 Family history of malignant neoplasm of breast: Secondary | ICD-10-CM | POA: Diagnosis not present

## 2019-08-20 DIAGNOSIS — R5381 Other malaise: Secondary | ICD-10-CM | POA: Diagnosis not present

## 2019-08-20 LAB — CBC WITH DIFFERENTIAL/PLATELET
Abs Immature Granulocytes: 0.17 10*3/uL — ABNORMAL HIGH (ref 0.00–0.07)
Basophils Absolute: 0 10*3/uL (ref 0.0–0.1)
Basophils Relative: 1 %
Eosinophils Absolute: 0.1 10*3/uL (ref 0.0–0.5)
Eosinophils Relative: 1 %
HCT: 28.4 % — ABNORMAL LOW (ref 36.0–46.0)
Hemoglobin: 9 g/dL — ABNORMAL LOW (ref 12.0–15.0)
Immature Granulocytes: 2 %
Lymphocytes Relative: 31 %
Lymphs Abs: 2.4 10*3/uL (ref 0.7–4.0)
MCH: 31.8 pg (ref 26.0–34.0)
MCHC: 31.7 g/dL (ref 30.0–36.0)
MCV: 100.4 fL — ABNORMAL HIGH (ref 80.0–100.0)
Monocytes Absolute: 1 10*3/uL (ref 0.1–1.0)
Monocytes Relative: 12 %
Neutro Abs: 4.1 10*3/uL (ref 1.7–7.7)
Neutrophils Relative %: 53 %
Platelets: 259 10*3/uL (ref 150–400)
RBC: 2.83 MIL/uL — ABNORMAL LOW (ref 3.87–5.11)
RDW: 24.7 % — ABNORMAL HIGH (ref 11.5–15.5)
WBC: 7.7 10*3/uL (ref 4.0–10.5)
nRBC: 1.2 % — ABNORMAL HIGH (ref 0.0–0.2)

## 2019-08-20 LAB — BASIC METABOLIC PANEL
Anion gap: 8 (ref 5–15)
BUN: 17 mg/dL (ref 8–23)
CO2: 25 mmol/L (ref 22–32)
Calcium: 9.1 mg/dL (ref 8.9–10.3)
Chloride: 103 mmol/L (ref 98–111)
Creatinine, Ser: 0.73 mg/dL (ref 0.44–1.00)
GFR calc Af Amer: >60 mL/min (ref >60)
GFR calc non Af Amer: >60 mL/min (ref >60)
Glucose, Bld: 159 mg/dL — ABNORMAL HIGH (ref 70–99)
Potassium: 3.8 mmol/L (ref 3.5–5.1)
Sodium: 136 mmol/L (ref 135–145)

## 2019-08-20 NOTE — Assessment & Plan Note (Addendum)
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 Normal cytogenetics.R-IPSS- Very Low risk.  Hemoglobin is 9.4/normal white count normal platelets.  POSITIVE  For SF3B1.   #Hemoglobin slowly trending down 9.0.  Overall stable.  Recommend monitor for now.  #Reviewed with the patient again the potential indication for treatment including worsening fatigue/shortness of breath on exertion.  Given patient is fairly asymptomatic continue surveillance.  Patient understands that she will/will need treatment/erythropoietin stimulating agents in the next few- many months.   #Neck discomfort likely musculoskeletal-defer to PCP for further management.  # DISPOSITION: # follow up in 6 weeks- MD/lab-cbc/bmpDr.B  Cc; Dr.Towers.

## 2019-08-20 NOTE — Progress Notes (Signed)
[place] NOTE  Patient Care Team: [name], [name], MD as PCP - General  CHIEF COMPLAINTS/PURPOSE OF CONSULTATION: Anemia   Oncology History Overview Note   # July 2020-myelodysplastic syndrome with ringed sideroblasts-Normal karyotype; no blasts [IPSS R-Very low risk ~median survival 8.3 years]; iron studies A30 folic acid myeloma panel normal; pyridoxine levels/copper/zinc-WNL. Erythropoietin levels-60. 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  # colonoscopy- 2016 [Dr.Bucinni];  DIAGNOSIS: MDS/low-grade with ringed sideroblasts  STAGE: Low       ;  GOALS: Control  CURRENT/MOST RECENT THERAPY : Surveillance    MDS (myelodysplastic syndrome), low grade ([place])  04/23/2019 Initial Diagnosis   MDS (myelodysplastic syndrome), low grade ([facility])      HISTORY OF PRESENTING ILLNESS:  Carla [name] 68 y.o.  female history of low-grade MDS anemia with ring sideroblasts currently on surveillance is here for follow-up.  Patient complains of neck discomfort especially with movement for the last many months.  Intermittent.  She has not tried any NSAIDs.  Also complains of mild fatigue.  Otherwise she is able to carry on with her daily activities.  No nausea no vomiting.   Review of Systems  Constitutional: Positive for malaise/fatigue. Negative for chills, diaphoresis and fever.  HENT: Negative for nosebleeds and sore throat.   Eyes: Negative for double vision.  Respiratory: Negative for cough, hemoptysis, sputum production, shortness of breath and wheezing.   Cardiovascular: Negative for chest pain, orthopnea and leg swelling.  Gastrointestinal: Negative for abdominal pain, blood in stool, constipation, diarrhea, heartburn, melena and vomiting.   Genitourinary: Negative for dysuria, frequency and urgency.  Musculoskeletal: Positive for neck pain. Negative for back pain and joint pain.  Skin: Negative.  Negative for itching and rash.  Neurological: Negative for dizziness, tingling, focal weakness, weakness and headaches.  Endo/Heme/Allergies: Does not bruise/bleed easily.  Psychiatric/Behavioral: Negative for depression. The patient is not nervous/anxious and does not have insomnia.     MEDICAL HISTORY:  Past Medical History:  Diagnosis Date  . Allergic rhinitis, cause unspecified   . Anemia, unspecified   . Carpal tunnel syndrome   . Cystitis, unspecified   . Family history of osteoporosis   . PONV (postoperative nausea and vomiting)   . Postnasal drip   . Syncope and collapse     SURGICAL HISTORY: Past Surgical History:  Procedure Laterality Date  . CARPAL TUNNEL RELEASE    . COLONOSCOPY  2005  . COLONOSCOPY WITH PROPOFOL N/A 07/31/2015   Procedure: COLONOSCOPY WITH PROPOFOL;  Surgeon: Ronald Lobo, MD;  Location: [facility];  Service: Endoscopy;  Laterality: N/A;  . DIAGNOSTIC LAPAROSCOPY     dx lack of pregnancy   . EYE SURGERY     lasik, cataract, prk,yag procedure    SOCIAL HISTORY: Social History   Socioeconomic History  . Marital status: Married    Spouse name: Not on file  . Number of children: Not on file  . Years of education: Not on file  . Highest education level: Not on file  Occupational History  . Not on file  Social Needs  . Financial resource strain: Not on file  . Food insecurity    Worry: Not on file    Inability: Not on file  . Transportation needs    Medical: Not on file    Non-medical: Not on file  Tobacco Use  . Smoking status:  Never Smoker  . Smokeless tobacco: Never Used  Substance and Sexual Activity  . Alcohol use: Yes    Alcohol/week: 0.0 standard drinks    Comment: Rare  . Drug use: No  . Sexual activity: Not Currently  Lifestyle  . Physical activity    Days per  week: Not on file    Minutes per session: Not on file  . Stress: Not on file  Relationships  . Social Herbalist on phone: Not on file    Gets together: Not on file    Attends religious service: Not on file    Active member of club or organization: Not on file    Attends meetings of clubs or organizations: Not on file    Relationship status: Not on file  . Intimate partner violence    Fear of current or ex partner: Not on file    Emotionally abused: Not on file    Physically abused: Not on file    Forced sexual activity: Not on file  Other Topics Concern  . Not on file  Social History Narrative   No regular exercise      Drinks lots of [employer]eps[employer]          FAMILY HISTORY: Family History  Problem Relation Age of Onset  . Osteoporosis Mother   . Stroke Mother   . Coronary artery disease Father   . Stroke Father 49  . Diabetes Other        Aunts and uncles  . Breast cancer Paternal Aunt   . Breast cancer Maternal Aunt   . Arthritis/Rheumatoid Child     ALLERGIES:  is allergic to codeine.  MEDICATIONS:  Current Outpatient Medications  Medication Sig Dispense Refill  . BIOTIN PO Take 1 tablet by mouth daily.    . Calcium Carb-Cholecalciferol (CALCIUM 600+D3 PO) Take by mouth.    . levothyroxine (SYNTHROID) 25 MCG tablet TAKE 1 TABLET BY MOUTH ONCE A DAY. TAKE ON AN EMPTY STOMACH WITH A GLASS OF WATER ATLEAST 30-60 MIN BEFORE BREAKFAST 90 tablet 3  . valACYclovir (VALTREX) 1000 MG tablet Take 1 tablet (1,000 mg total) by mouth 2 (two) times daily. 20 tablet 0  . cetirizine (ZYRTEC) 10 MG tablet Take 10 mg by mouth daily as needed for allergies.      No current facility-administered medications for this visit.       PHYSICAL EXAMINATION:   Vitals:   08/20/19 1408  BP: (!) 147/68  Pulse: 79  Resp: 16  Temp: (!) 97.1 F (36.2 C)  SpO2: 100%   Filed Weights   08/20/19 1408  Weight: 154 lb 12.8 oz (70.2 kg)    Physical Exam  Constitutional: She is  oriented to person, place, and time and well-developed, well-nourished, and in no distress.  HENT:  Head: Normocephalic and atraumatic.  Mouth/Throat: Oropharynx is clear and moist. No oropharyngeal exudate.  Eyes: Pupils are equal, round, and reactive to light.  Neck: Normal range of motion. Neck supple.  Cardiovascular: Normal rate and regular rhythm.  Pulmonary/Chest: No respiratory distress. She has no wheezes.  Abdominal: Soft. Bowel sounds are normal. She exhibits no distension and no mass. There is no abdominal tenderness. There is no rebound and no guarding.  Musculoskeletal: Normal range of motion.        General: No tenderness or edema.  Neurological: She is alert and oriented to person, place, and time.  Skin: Skin is warm.  Psychiatric: Affect normal.  LABORATORY DATA:  I have reviewed the data as listed Lab Results  Component Value Date   WBC 7.7 08/20/2019   HGB 9.0 (L) 08/20/2019   HCT 28.4 (L) 08/20/2019   MCV 100.4 (H) 08/20/2019   PLT 259 08/20/2019   Recent Labs    03/02/19 1101 08/20/19 1357  NA 138 136  K 4.2 3.8  CL 103 103  CO2 27 25  GLUCOSE 88 159*  BUN 17 17  CREATININE 0.82 0.73  CALCIUM 9.1 9.1  GFRNONAA  --  >60  GFRAA  --  >60  PROT 7.4  --   ALBUMIN 4.2  --   AST 14  --   ALT 12  --   ALKPHOS 44  --   BILITOT 0.7  --      No results found.  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 Normal cytogenetics.R-IPSS- Very Low risk.  Hemoglobin is 9.4/normal white count normal platelets.  POSITIVE  For SF3B1.   #Hemoglobin slowly trending down 9.0.  Overall stable.  Recommend monitor for now.  #Reviewed with the patient again the potential indication for treatment including worsening fatigue/shortness of breath on exertion.  Given patient is fairly asymptomatic continue surveillance.  Patient understands that she will/will need treatment/erythropoietin stimulating agents in the  next few- many months.   #Neck discomfort likely musculoskeletal-defer to PCP for further management.  # DISPOSITION: # follow up in 6 weeks- MD/lab-cbc/bmpDr.B  Cc; Dr.Towers.   All questions were answered. The patient knows to call the clinic with any problems, questions or concerns.   Cammie Sickle, MD 08/20/2019 2:43 PM

## 2019-09-17 ENCOUNTER — Encounter: Admit: 2019-09-17 | Discharge: 2019-09-17 | Payer: MEDICARE | Primary: Family

## 2019-09-18 ENCOUNTER — Encounter: Admit: 2019-09-18 | Discharge: 2019-09-18 | Payer: MEDICARE | Primary: Family

## 2019-09-18 DIAGNOSIS — C4362 Malignant melanoma of left upper limb, including shoulder: Secondary | ICD-10-CM

## 2019-09-18 DIAGNOSIS — I471 Supraventricular tachycardia: Secondary | ICD-10-CM

## 2019-09-18 DIAGNOSIS — C50919 Malignant neoplasm of unspecified site of unspecified female breast: Secondary | ICD-10-CM

## 2019-09-18 DIAGNOSIS — M1990 Unspecified osteoarthritis, unspecified site: Secondary | ICD-10-CM

## 2019-09-18 DIAGNOSIS — E669 Obesity, unspecified: Secondary | ICD-10-CM

## 2019-09-18 DIAGNOSIS — H3530 Unspecified macular degeneration: Secondary | ICD-10-CM

## 2019-09-18 DIAGNOSIS — R002 Palpitations: Secondary | ICD-10-CM

## 2019-09-18 DIAGNOSIS — I10 Essential (primary) hypertension: Secondary | ICD-10-CM

## 2019-09-18 DIAGNOSIS — C439 Malignant melanoma of skin, unspecified: Secondary | ICD-10-CM

## 2019-09-18 LAB — LDH-LACTATE DEHYDROGENASE: Lab: 175 U/L (ref 100–210)

## 2019-09-18 NOTE — Progress Notes
Name: Kaitlyn Friedman          MRN: [id number]      DOB: Dec 04, 1950      AGE: 68 y.o.   DATE OF SERVICE: 09/18/2019    Subjective:             Reason for Visit:  Follow Up      Kaitlyn Friedman is a 68 y.o. female.     Cancer Staging  Malignant neoplasm of upper-outer quadrant of left female breast 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     She noticed the lesion changing over a few months and sought evaluation from dermatology who performed the initial biopsy on 12/24/2014 showing 2.1 mm melanoma. She also has a history of left breast cancer in 2013.    On 02/07/2015, Dr. Lorelei Pont performed a wide local excision of the left arm, intraoperative lymphatic mapping, left axillary sentinel lymph node biopsy. Pathology revealed no residual melanoma and axillary lymph node basin revealed 0 of 8 nodes positive for metastasis.    Patient presents for a routine surveillance visit today. Patient continues to follow with dermatology regularly and denies any new dysplastic lesions, subcutaneous masses or lymphadenopathy.       Review of Systems   Constitutional: Negative for chills, fatigue and fever.   Respiratory: Negative for cough, shortness of breath and wheezing.    Cardiovascular: Negative for chest pain, palpitations and leg swelling.   Musculoskeletal: Negative for arthralgias and back pain.   Skin:        No new dysplastic lesions or subcutaneous masses.   Hematological: Negative for adenopathy. Does not bruise/bleed easily.   All other systems reviewed and are negative.        Objective:         ? Cholecalciferol (Vitamin D3) 2,000 unit cap take by mouth daily   ? cyclobenzaprine (FLEXERIL) 10 mg tablet ? ibuprofen (MOTRIN) 800 mg tablet Take 1 tablet by mouth every 6 hours as needed for Pain. Take with food.   ? lisinopril (PRINIVIL, ZESTRIL) 40 mg tablet Take 40 mg by mouth at bedtime daily.   ? metoprolol XL (TOPROL XL) 50 mg tablet Take 50 mg by mouth at bedtime daily.   ? MULTIVITAMIN PO Take 1 tablet by mouth daily.   ? Vitamin A-Vitamin C-Vit E-Min (PRESERVISION AREDS) cap Take 1 capsule by mouth daily.     Vitals:    09/18/19 0811   BP: (!) 169/89   BP Source: Arm, Right Lower   Patient Position: Sitting   Pulse: 60   Resp: 16   Temp: 36.4 ?C (97.6 ?F)   TempSrc: Skin   SpO2: 99%   Weight: 109.2 kg (240 lb 12.8 oz)   Height: 166 cm (65.35)   PainSc: Zero     Body mass index is 39.64 kg/m?Marland Kitchen     Pain Score: Zero       Fatigue Scale: 0-None    Pain Addressed:  N/A    Patient Evaluated for a Clinical Trial: No treatment clinical trial available for this patient.  [place] Cooperative Oncology Group performance status is 0, Fully active, able to carry on all pre-disease performance without restriction.Marland Kitchen     Physical Exam  Vitals signs reviewed.   Constitutional:       Appearance: She is well-developed.   HENT:      Head: Normocephalic and atraumatic.   Eyes:      Pupils: Pupils are equal, round, and reactive to light.   Neck:      Musculoskeletal: Normal range of motion.   Pulmonary:      Effort: Pulmonary effort is normal.   Abdominal:      General: There is no distension.      Palpations: Abdomen is soft. There is no mass.      Tenderness: There is no abdominal tenderness.   Musculoskeletal: Normal range of motion.   Lymphadenopathy:      Cervical: No cervical adenopathy.      Upper Body:      Right upper body: No supraclavicular adenopathy.      Left upper body: No supraclavicular adenopathy.   Skin:     General: Skin is warm and dry.             Comments: No dysplastic nevi or subcutaneous masses   Neurological:      Mental Status: She is alert and oriented to person, place, and time.   Psychiatric: Behavior: Behavior normal.         Thought Content: Thought content normal.         Judgment: Judgment normal.               Assessment and Plan:  Kaitlyn Friedman has history of 2.1 mm melanoma on the left upper arm  - No evidence of recurrent or metastatic disease on exam  - Reviewed with pt that Dr. Wallie Char will be leaving [place]. Continued melanoma surveillance in conjunction with Dr. Adora Fridge, medical oncology, is recommended. Pt expressed understanding and agrees to continued surveillance.  - Dermatology surveillance    Lilian Kapur, [name]    Collaborating physician: Lorelei Pont, MD

## 2019-10-01 ENCOUNTER — Inpatient Hospital Stay: Payer: PPO | Attending: Internal Medicine

## 2019-10-01 ENCOUNTER — Other Ambulatory Visit: Payer: Self-pay

## 2019-10-01 ENCOUNTER — Encounter: Payer: Self-pay | Admitting: Internal Medicine

## 2019-10-01 ENCOUNTER — Inpatient Hospital Stay (HOSPITAL_BASED_OUTPATIENT_CLINIC_OR_DEPARTMENT_OTHER): Payer: PPO | Admitting: Internal Medicine

## 2019-10-01 DIAGNOSIS — D461 Refractory anemia with ring sideroblasts: Secondary | ICD-10-CM | POA: Diagnosis not present

## 2019-10-01 DIAGNOSIS — E039 Hypothyroidism, unspecified: Secondary | ICD-10-CM | POA: Insufficient documentation

## 2019-10-01 DIAGNOSIS — D4620 Refractory anemia with excess of blasts, unspecified: Secondary | ICD-10-CM | POA: Insufficient documentation

## 2019-10-01 DIAGNOSIS — R0609 Other forms of dyspnea: Secondary | ICD-10-CM | POA: Diagnosis not present

## 2019-10-01 DIAGNOSIS — R5383 Other fatigue: Secondary | ICD-10-CM | POA: Diagnosis not present

## 2019-10-01 DIAGNOSIS — R5381 Other malaise: Secondary | ICD-10-CM | POA: Diagnosis not present

## 2019-10-01 DIAGNOSIS — Z79899 Other long term (current) drug therapy: Secondary | ICD-10-CM | POA: Diagnosis not present

## 2019-10-01 DIAGNOSIS — Z803 Family history of malignant neoplasm of breast: Secondary | ICD-10-CM | POA: Insufficient documentation

## 2019-10-01 LAB — CBC WITH DIFFERENTIAL/PLATELET
Abs Immature Granulocytes: 0.21 10*3/uL — ABNORMAL HIGH (ref 0.00–0.07)
Basophils Absolute: 0.1 10*3/uL (ref 0.0–0.1)
Basophils Relative: 1 %
Eosinophils Absolute: 0.1 10*3/uL (ref 0.0–0.5)
Eosinophils Relative: 1 %
HCT: 26.9 % — ABNORMAL LOW (ref 36.0–46.0)
Hemoglobin: 8.6 g/dL — ABNORMAL LOW (ref 12.0–15.0)
Immature Granulocytes: 3 %
Lymphocytes Relative: 34 %
Lymphs Abs: 2.7 10*3/uL (ref 0.7–4.0)
MCH: 32.7 pg (ref 26.0–34.0)
MCHC: 32 g/dL (ref 30.0–36.0)
MCV: 102.3 fL — ABNORMAL HIGH (ref 80.0–100.0)
Monocytes Absolute: 0.9 10*3/uL (ref 0.1–1.0)
Monocytes Relative: 11 %
Neutro Abs: 4.1 10*3/uL (ref 1.7–7.7)
Neutrophils Relative %: 50 %
Platelets: 259 10*3/uL (ref 150–400)
RBC: 2.63 MIL/uL — ABNORMAL LOW (ref 3.87–5.11)
RDW: 25.5 % — ABNORMAL HIGH (ref 11.5–15.5)
WBC: 8 10*3/uL (ref 4.0–10.5)
nRBC: 1.1 % — ABNORMAL HIGH (ref 0.0–0.2)

## 2019-10-01 LAB — BASIC METABOLIC PANEL
Anion gap: 8 (ref 5–15)
BUN: 20 mg/dL (ref 8–23)
CO2: 25 mmol/L (ref 22–32)
Calcium: 9 mg/dL (ref 8.9–10.3)
Chloride: 104 mmol/L (ref 98–111)
Creatinine, Ser: 0.83 mg/dL (ref 0.44–1.00)
GFR calc Af Amer: >60 mL/min (ref >60)
GFR calc non Af Amer: >60 mL/min (ref >60)
Glucose, Bld: 166 mg/dL — ABNORMAL HIGH (ref 70–99)
Potassium: 3.6 mmol/L (ref 3.5–5.1)
Sodium: 137 mmol/L (ref 135–145)

## 2019-10-01 NOTE — Assessment & Plan Note (Addendum)
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 POSITIVE  For SF3B1; R-IPSS- Very Low risk.  However hemoglobin slowly trending down/symptomatic  # Hemoglobin is 8.6/ normal white count normal platelets.  Recommend start aranesp/Retacrit SQ. Discussed use of erythropoietin stimulating agents like Aranesp to stimulate the bone marrow.  Discussed the potential issues with erythropoietin estimating agents-given the risk of stroke thromboembolic events/elevated blood pressure.  However, most of the serious events did not happen when the goal hematocrit is 33/hemoglobin 30.  I also discussed the use of IV iron infusion if needed based on iron studies. Pt in agreement.   # DISPOSITION: # Aranesp SQ injection in 1 week- no labs; # 3 weeks- MD; lab-cbc-Aranesp SQ injection- Dr.B  Cc; Dr.Towers.

## 2019-10-01 NOTE — Progress Notes (Signed)
[place] NOTE  Patient Care Team: [name], [name], MD as PCP - General  CHIEF COMPLAINTS/PURPOSE OF CONSULTATION: Anemia   Oncology History Overview Note   # July 2020-myelodysplastic syndrome with ringed sideroblasts-Normal karyotype; no blasts [IPSS R-Very low risk ~median survival 8.3 years]; iron studies E07 folic acid myeloma panel normal; pyridoxine levels/copper/zinc-WNL. Erythropoietin levels-60. 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  # colonoscopy- 2016 [Dr.Bucinni];  DIAGNOSIS: MDS/low-grade with ringed sideroblasts  STAGE: Low       ;  GOALS: Control  CURRENT/MOST RECENT THERAPY : EPO agent    MDS (myelodysplastic syndrome), low grade ([place])  04/23/2019 Initial Diagnosis   MDS (myelodysplastic syndrome), low grade ([facility])      HISTORY OF PRESENTING ILLNESS:  Carla [name] 69 y.o.  female history of low-grade MDS anemia with ring sideroblasts currently on surveillance is here for follow-up.  Patient complains of worsening fatigue.  Shortness of breath on exertion.  Otherwise denies any blood in stools or black or stools.  No nausea no vomiting.  Review of Systems  Constitutional: Positive for malaise/fatigue. Negative for chills, diaphoresis and fever.  HENT: Negative for nosebleeds and sore throat.   Eyes: Negative for double vision.  Respiratory: Negative for cough, hemoptysis, sputum production, shortness of breath and wheezing.   Cardiovascular: Negative for chest pain, orthopnea and leg swelling.  Gastrointestinal: Negative for abdominal pain, blood in stool, constipation, diarrhea, heartburn, melena and vomiting.  Genitourinary: Negative for dysuria, frequency and urgency.   Musculoskeletal: Negative for back pain and joint pain.  Skin: Negative.  Negative for itching and rash.  Neurological: Negative for dizziness, tingling, focal weakness, weakness and headaches.  Endo/Heme/Allergies: Does not bruise/bleed easily.  Psychiatric/Behavioral: Negative for depression. The patient is not nervous/anxious and does not have insomnia.     MEDICAL HISTORY:  Past Medical History:  Diagnosis Date  . Allergic rhinitis, cause unspecified   . Anemia, unspecified   . Carpal tunnel syndrome   . Cystitis, unspecified   . Family history of osteoporosis   . PONV (postoperative nausea and vomiting)   . Postnasal drip   . Syncope and collapse     SURGICAL HISTORY: Past Surgical History:  Procedure Laterality Date  . CARPAL TUNNEL RELEASE    . COLONOSCOPY  2005  . COLONOSCOPY WITH PROPOFOL N/A 07/31/2015   Procedure: COLONOSCOPY WITH PROPOFOL;  Surgeon: Ronald Lobo, MD;  Location: [facility];  Service: Endoscopy;  Laterality: N/A;  . DIAGNOSTIC LAPAROSCOPY     dx lack of pregnancy   . EYE SURGERY     lasik, cataract, prk,yag procedure    SOCIAL HISTORY: Social History   Socioeconomic History  . Marital status: Married    Spouse name: Not on file  . Number of children: Not on file  . Years of education: Not on file  . Highest education level: Not on file  Occupational History  . Not on file  Tobacco Use  . Smoking status: Never Smoker  . Smokeless tobacco: Never Used  Substance and Sexual Activity  . Alcohol use: Yes    Alcohol/week: 0.0 standard drinks    Comment: Rare  . Drug use: No  . Sexual activity: Not Currently  Other Topics Concern  . Not on file  Social History Narrative   No regular exercise  Drinks lots of [employer]         Social Determinants of Health   Financial Resource Strain:   . Difficulty of Paying Living Expenses: Not on file  Food Insecurity:   . Worried About Charity fundraiser in the Last Year: Not on file  .  Ran Out of Food in the Last Year: Not on file  Transportation Needs:   . Lack of Transportation (Medical): Not on file  . Lack of Transportation (Non-Medical): Not on file  Physical Activity:   . Days of Exercise per Week: Not on file  . Minutes of Exercise per Session: Not on file  Stress:   . Feeling of Stress : Not on file  Social Connections:   . Frequency of Communication with Friends and Family: Not on file  . Frequency of Social Gatherings with Friends and Family: Not on file  . Attends Religious Services: Not on file  . Active Member of Clubs or Organizations: Not on file  . Attends Archivist Meetings: Not on file  . Marital Status: Not on file  Intimate Partner Violence:   . Fear of Current or Ex-Partner: Not on file  . Emotionally Abused: Not on file  . Physically Abused: Not on file  . Sexually Abused: Not on file    FAMILY HISTORY: Family History  Problem Relation Age of Onset  . Osteoporosis Mother   . Stroke Mother   . Coronary artery disease Father   . Stroke Father 66  . Diabetes Other        Aunts and uncles  . Breast cancer Paternal Aunt   . Breast cancer Maternal Aunt   . Arthritis/Rheumatoid Child     ALLERGIES:  is allergic to codeine.  MEDICATIONS:  Current Outpatient Medications  Medication Sig Dispense Refill  . BIOTIN PO Take 1 tablet by mouth daily.    . Calcium Carb-Cholecalciferol (CALCIUM 600+D3 PO) Take by mouth.    . cetirizine (ZYRTEC) 10 MG tablet Take 10 mg by mouth daily as needed for allergies.     Marland Kitchen levothyroxine (SYNTHROID) 25 MCG tablet TAKE 1 TABLET BY MOUTH ONCE A DAY. TAKE ON AN EMPTY STOMACH WITH A GLASS OF WATER ATLEAST 30-60 MIN BEFORE BREAKFAST 90 tablet 3  . valACYclovir (VALTREX) 1000 MG tablet Take 1 tablet (1,000 mg total) by mouth 2 (two) times daily. 20 tablet 0   No current facility-administered medications for this visit.      PHYSICAL EXAMINATION:   Vitals:   10/01/19 1442  BP: 136/78  Pulse:  80  Temp: (!) 97.2 F (36.2 C)  SpO2: 97%   Filed Weights   10/01/19 1442  Weight: 156 lb (70.8 kg)    Physical Exam  Constitutional: She is oriented to person, place, and time and well-developed, well-nourished, and in no distress.  HENT:  Head: Normocephalic and atraumatic.  Mouth/Throat: Oropharynx is clear and moist. No oropharyngeal exudate.  Eyes: Pupils are equal, round, and reactive to light.  Cardiovascular: Normal rate and regular rhythm.  Pulmonary/Chest: No respiratory distress. She has no wheezes.  Abdominal: Soft. Bowel sounds are normal. She exhibits no distension and no mass. There is no abdominal tenderness. There is no rebound and no guarding.  Musculoskeletal:        General: No tenderness or edema. Normal range of motion.     Cervical back: Normal range of motion and neck supple.  Neurological: She is alert and oriented to person, place, and time.  Skin: Skin is warm.  Psychiatric: Affect normal.    LABORATORY DATA:  I have reviewed the data as listed Lab Results  Component Value Date   WBC 8.0 10/01/2019   HGB 8.6 (L) 10/01/2019   HCT 26.9 (L) 10/01/2019   MCV 102.3 (H) 10/01/2019   PLT 259 10/01/2019   Recent Labs    03/02/19 1101 08/20/19 1357 10/01/19 1418  NA 138 136 137  K 4.2 3.8 3.6  CL 103 103 104  CO2 '27 25 25  ' GLUCOSE 88 159* 166*  BUN '17 17 20  ' CREATININE 0.82 0.73 0.83  CALCIUM 9.1 9.1 9.0  GFRNONAA  --  >60 >60  GFRAA  --  >60 >60  PROT 7.4  --   --   ALBUMIN 4.2  --   --   AST 14  --   --   ALT 12  --   --   ALKPHOS 44  --   --   BILITOT 0.7  --   --      No results found.  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 POSITIVE  For SF3B1; R-IPSS- Very Low risk.  However hemoglobin slowly trending down/symptomatic  # Hemoglobin is 8.6/ normal white count normal platelets.  Recommend start aranesp/Retacrit SQ. Discussed use of erythropoietin stimulating agents  like Aranesp to stimulate the bone marrow.  Discussed the potential issues with erythropoietin estimating agents-given the risk of stroke thromboembolic events/elevated blood pressure.  However, most of the serious events did not happen when the goal hematocrit is 33/hemoglobin 30.  I also discussed the use of IV iron infusion if needed based on iron studies. Pt in agreement.   # DISPOSITION: # Aranesp SQ injection in 1 week- no labs; # 3 weeks- MD; lab-cbc-Aranesp SQ injection- Dr.B  Cc; Dr.Towers.   All questions were answered. The patient knows to call the clinic with any problems, questions or concerns.   Cammie Sickle, MD 10/02/2019 7:23 AM

## 2019-10-08 ENCOUNTER — Inpatient Hospital Stay: Payer: PPO

## 2019-10-08 ENCOUNTER — Other Ambulatory Visit: Payer: Self-pay

## 2019-10-08 VITALS — BP 132/73 | HR 70

## 2019-10-08 DIAGNOSIS — D461 Refractory anemia with ring sideroblasts: Secondary | ICD-10-CM | POA: Diagnosis not present

## 2019-10-08 DIAGNOSIS — D4620 Refractory anemia with excess of blasts, unspecified: Secondary | ICD-10-CM

## 2019-10-08 MED ORDER — DARBEPOETIN ALFA 200 MCG/0.4ML IJ SOSY
200.0000 ug | PREFILLED_SYRINGE | Freq: Once | INTRAMUSCULAR | Status: AC
  Administered 2019-10-08: 200 ug via SUBCUTANEOUS
  Filled 2019-10-08: qty 0.4

## 2019-10-11 DIAGNOSIS — L309 Dermatitis, unspecified: Secondary | ICD-10-CM | POA: Diagnosis not present

## 2019-10-11 DIAGNOSIS — D2262 Melanocytic nevi of left upper limb, including shoulder: Secondary | ICD-10-CM | POA: Diagnosis not present

## 2019-10-11 DIAGNOSIS — L570 Actinic keratosis: Secondary | ICD-10-CM | POA: Diagnosis not present

## 2019-10-11 DIAGNOSIS — D225 Melanocytic nevi of trunk: Secondary | ICD-10-CM | POA: Diagnosis not present

## 2019-10-11 DIAGNOSIS — D2272 Melanocytic nevi of left lower limb, including hip: Secondary | ICD-10-CM | POA: Diagnosis not present

## 2019-10-11 DIAGNOSIS — L821 Other seborrheic keratosis: Secondary | ICD-10-CM | POA: Diagnosis not present

## 2019-10-11 DIAGNOSIS — L718 Other rosacea: Secondary | ICD-10-CM | POA: Diagnosis not present

## 2019-10-11 DIAGNOSIS — D485 Neoplasm of uncertain behavior of skin: Secondary | ICD-10-CM | POA: Diagnosis not present

## 2019-10-11 DIAGNOSIS — D2261 Melanocytic nevi of right upper limb, including shoulder: Secondary | ICD-10-CM | POA: Diagnosis not present

## 2019-10-11 DIAGNOSIS — D2271 Melanocytic nevi of right lower limb, including hip: Secondary | ICD-10-CM | POA: Diagnosis not present

## 2019-10-11 DIAGNOSIS — L258 Unspecified contact dermatitis due to other agents: Secondary | ICD-10-CM | POA: Diagnosis not present

## 2019-10-19 ENCOUNTER — Other Ambulatory Visit: Payer: Self-pay | Admitting: *Deleted

## 2019-10-19 DIAGNOSIS — D46Z Other myelodysplastic syndromes: Secondary | ICD-10-CM

## 2019-10-19 DIAGNOSIS — D4620 Refractory anemia with excess of blasts, unspecified: Secondary | ICD-10-CM

## 2019-10-22 ENCOUNTER — Inpatient Hospital Stay (HOSPITAL_BASED_OUTPATIENT_CLINIC_OR_DEPARTMENT_OTHER): Payer: PPO | Admitting: Internal Medicine

## 2019-10-22 ENCOUNTER — Inpatient Hospital Stay: Payer: PPO

## 2019-10-22 ENCOUNTER — Encounter: Payer: Self-pay | Admitting: Internal Medicine

## 2019-10-22 ENCOUNTER — Other Ambulatory Visit: Payer: Self-pay

## 2019-10-22 DIAGNOSIS — D4620 Refractory anemia with excess of blasts, unspecified: Secondary | ICD-10-CM

## 2019-10-22 DIAGNOSIS — D461 Refractory anemia with ring sideroblasts: Secondary | ICD-10-CM | POA: Diagnosis not present

## 2019-10-22 LAB — CBC WITH DIFFERENTIAL/PLATELET
Abs Immature Granulocytes: 0.18 10*3/uL — ABNORMAL HIGH (ref 0.00–0.07)
Basophils Absolute: 0 10*3/uL (ref 0.0–0.1)
Basophils Relative: 1 %
Eosinophils Absolute: 0.1 10*3/uL (ref 0.0–0.5)
Eosinophils Relative: 1 %
HCT: 27.7 % — ABNORMAL LOW (ref 36.0–46.0)
Hemoglobin: 8.5 g/dL — ABNORMAL LOW (ref 12.0–15.0)
Immature Granulocytes: 3 %
Lymphocytes Relative: 31 %
Lymphs Abs: 2.2 10*3/uL (ref 0.7–4.0)
MCH: 31.8 pg (ref 26.0–34.0)
MCHC: 30.7 g/dL (ref 30.0–36.0)
MCV: 103.7 fL — ABNORMAL HIGH (ref 80.0–100.0)
Monocytes Absolute: 1.1 10*3/uL — ABNORMAL HIGH (ref 0.1–1.0)
Monocytes Relative: 15 %
Neutro Abs: 3.6 10*3/uL (ref 1.7–7.7)
Neutrophils Relative %: 49 %
Platelets: 238 10*3/uL (ref 150–400)
RBC: 2.67 MIL/uL — ABNORMAL LOW (ref 3.87–5.11)
RDW: 26.3 % — ABNORMAL HIGH (ref 11.5–15.5)
WBC: 7.2 10*3/uL (ref 4.0–10.5)
nRBC: 2 % — ABNORMAL HIGH (ref 0.0–0.2)

## 2019-10-22 MED ORDER — DARBEPOETIN ALFA 200 MCG/0.4ML IJ SOSY
200.0000 ug | PREFILLED_SYRINGE | Freq: Once | INTRAMUSCULAR | Status: AC
  Administered 2019-10-22: 200 ug via SUBCUTANEOUS
  Filled 2019-10-22: qty 0.4

## 2019-10-22 NOTE — Patient Instructions (Signed)
#   Take Vitron C once a day-[over the counter

## 2019-10-22 NOTE — Assessment & Plan Note (Addendum)
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 POSITIVE  For SF3B1; R-IPSS- Very Low risk.  However hemoglobin slowly trending down/symptomatic.  Currently on Aranesp every 2 weeks.  # Hemoglobin is 8.6/ normal white count normal platelets; worsening.-proceed with Aranesp today.  Discussed regarding p.o. iron intake Vitron-C.  If iron level still low would recommend IV iron.  # # I discussed regarding Covid-19 precautions.  I reviewed the vaccine effectiveness and potential side effects in detail.  Also discussed long-term effectiveness and safety profile are unclear at this time.  I discussed December, 2020 ASCO position statement-that all patients are recommended COVID-19 vaccinations [when available]-as long as they do not have allergy to components of the vaccine.  However, I think the benefits of the vaccination outweigh the potential risks.  # DISPOSITION: # Aranesp SQ injection today # in 2 weeks- H&H- Aranesp; # 4 weeks- MD; lab-cbc/bmp/iron studies/ferritin-Aranesp SQ injection- Dr.B

## 2019-10-23 NOTE — Progress Notes (Signed)
[place] NOTE  Patient Care Team: [name], [name], MD as PCP - General  CHIEF COMPLAINTS/PURPOSE OF CONSULTATION: Anemia   Oncology History Overview Note   # July 2020-myelodysplastic syndrome with ringed sideroblasts-Normal karyotype; no blasts [IPSS R-Very low risk ~median survival 8.3 years]; iron studies E42 folic acid myeloma panel normal; pyridoxine levels/copper/zinc-WNL. Erythropoietin levels-60. 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  # colonoscopy- 2016 [Dr.Bucinni];  DIAGNOSIS: MDS/low-grade with ringed sideroblasts  STAGE: Low       ;  GOALS: Control  CURRENT/MOST RECENT THERAPY : EPO agent    MDS (myelodysplastic syndrome), low grade ([place])  04/23/2019 Initial Diagnosis   MDS (myelodysplastic syndrome), low grade ([facility])      HISTORY OF PRESENTING ILLNESS:  Carla [name] 68 y.o.  female history of low-grade MDS anemia with ring sideroblasts currently on Aranesp is here for follow-up.  Patient continues to complain of fatigue.  Shortness of breath on exertion.  Otherwise denies any nausea vomiting abdominal pain.  No headaches.  Review of Systems  Constitutional: Positive for malaise/fatigue. Negative for chills, diaphoresis and fever.  HENT: Negative for nosebleeds and sore throat.   Eyes: Negative for double vision.  Respiratory: Negative for cough, hemoptysis, sputum production, shortness of breath and wheezing.   Cardiovascular: Negative for chest pain, orthopnea and leg swelling.  Gastrointestinal: Negative for abdominal pain, blood in stool, constipation, diarrhea, heartburn, melena and vomiting.  Genitourinary: Negative for dysuria, frequency and urgency.  Musculoskeletal:  Negative for back pain and joint pain.  Skin: Negative.  Negative for itching and rash.  Neurological: Negative for dizziness, tingling, focal weakness, weakness and headaches.  Endo/Heme/Allergies: Does not bruise/bleed easily.  Psychiatric/Behavioral: Negative for depression. The patient is not nervous/anxious and does not have insomnia.     MEDICAL HISTORY:  Past Medical History:  Diagnosis Date  . Allergic rhinitis, cause unspecified   . Anemia, unspecified   . Carpal tunnel syndrome   . Cystitis, unspecified   . Family history of osteoporosis   . PONV (postoperative nausea and vomiting)   . Postnasal drip   . Syncope and collapse     SURGICAL HISTORY: Past Surgical History:  Procedure Laterality Date  . CARPAL TUNNEL RELEASE    . COLONOSCOPY  2005  . COLONOSCOPY WITH PROPOFOL N/A 07/31/2015   Procedure: COLONOSCOPY WITH PROPOFOL;  Surgeon: Ronald Lobo, MD;  Location: [facility];  Service: Endoscopy;  Laterality: N/A;  . DIAGNOSTIC LAPAROSCOPY     dx lack of pregnancy   . EYE SURGERY     lasik, cataract, prk,yag procedure    SOCIAL HISTORY: Social History   Socioeconomic History  . Marital status: Married    Spouse name: Not on file  . Number of children: Not on file  . Years of education: Not on file  . Highest education level: Not on file  Occupational History  . Not on file  Tobacco Use  . Smoking status: Never Smoker  . Smokeless tobacco: Never Used  Substance and Sexual Activity  . Alcohol use: Yes    Alcohol/week: 0.0 standard drinks    Comment: Rare  . Drug use: No  . Sexual activity: Not Currently  Other Topics Concern  . Not on file  Social History Narrative   No regular exercise      Drinks lots of  [employer]eps[employer]         Social Determinants of Health   Financial Resource Strain:   . Difficulty of Paying Living Expenses: Not on file  Food Insecurity:   . Worried About Charity fundraiser in the Last Year: Not on file  . Ran Out of Food in  the Last Year: Not on file  Transportation Needs:   . Lack of Transportation (Medical): Not on file  . Lack of Transportation (Non-Medical): Not on file  Physical Activity:   . Days of Exercise per Week: Not on file  . Minutes of Exercise per Session: Not on file  Stress:   . Feeling of Stress : Not on file  Social Connections:   . Frequency of Communication with Friends and Family: Not on file  . Frequency of Social Gatherings with Friends and Family: Not on file  . Attends Religious Services: Not on file  . Active Member of Clubs or Organizations: Not on file  . Attends Archivist Meetings: Not on file  . Marital Status: Not on file  Intimate Partner Violence:   . Fear of Current or Ex-Partner: Not on file  . Emotionally Abused: Not on file  . Physically Abused: Not on file  . Sexually Abused: Not on file    FAMILY HISTORY: Family History  Problem Relation Age of Onset  . Osteoporosis Mother   . Stroke Mother   . Coronary artery disease Father   . Stroke Father 73  . Diabetes Other        Aunts and uncles  . Breast cancer Paternal Aunt   . Breast cancer Maternal Aunt   . Arthritis/Rheumatoid Child     ALLERGIES:  is allergic to codeine.  MEDICATIONS:  Current Outpatient Medications  Medication Sig Dispense Refill  . BIOTIN PO Take 1 tablet by mouth daily.    . Calcium Carb-Cholecalciferol (CALCIUM 600+D3 PO) Take by mouth.    . cetirizine (ZYRTEC) 10 MG tablet Take 10 mg by mouth daily as needed for allergies.     Marland Kitchen levothyroxine (SYNTHROID) 25 MCG tablet TAKE 1 TABLET BY MOUTH ONCE A DAY. TAKE ON AN EMPTY STOMACH WITH A GLASS OF WATER ATLEAST 30-60 MIN BEFORE BREAKFAST 90 tablet 3  . valACYclovir (VALTREX) 1000 MG tablet Take 1 tablet (1,000 mg total) by mouth 2 (two) times daily. 20 tablet 0   No current facility-administered medications for this visit.      PHYSICAL EXAMINATION:   Vitals:   10/22/19 1457  BP: 122/73  Pulse: 71  Temp: 97.7 F  (36.5 C)   Filed Weights   10/22/19 1457  Weight: 156 lb 6 oz (70.9 kg)    Physical Exam  Constitutional: She is oriented to person, place, and time and well-developed, well-nourished, and in no distress.  HENT:  Head: Normocephalic and atraumatic.  Mouth/Throat: Oropharynx is clear and moist. No oropharyngeal exudate.  Eyes: Pupils are equal, round, and reactive to light.  Cardiovascular: Normal rate and regular rhythm.  Pulmonary/Chest: No respiratory distress. She has no wheezes.  Abdominal: Soft. Bowel sounds are normal. She exhibits no distension and no mass. There is no abdominal tenderness. There is no rebound and no guarding.  Musculoskeletal:        General: No tenderness or edema. Normal range of motion.     Cervical back: Normal range of motion and neck supple.  Neurological: She is alert and oriented to person, place, and time.  Skin: Skin is warm.  Psychiatric: Affect normal.    LABORATORY DATA:  I have reviewed the data as listed Lab Results  Component Value Date   WBC 7.2 10/22/2019   HGB 8.5 (L) 10/22/2019   HCT 27.7 (L) 10/22/2019   MCV 103.7 (H) 10/22/2019   PLT 238 10/22/2019   Recent Labs    03/02/19 1101 08/20/19 1357 10/01/19 1418  NA 138 136 137  K 4.2 3.8 3.6  CL 103 103 104  CO2 '27 25 25  ' GLUCOSE 88 159* 166*  BUN '17 17 20  ' CREATININE 0.82 0.73 0.83  CALCIUM 9.1 9.1 9.0  GFRNONAA  --  >60 >60  GFRAA  --  >60 >60  PROT 7.4  --   --   ALBUMIN 4.2  --   --   AST 14  --   --   ALT 12  --   --   ALKPHOS 44  --   --   BILITOT 0.7  --   --      No results found.  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 POSITIVE  For SF3B1; R-IPSS- Very Low risk.  However hemoglobin slowly trending down/symptomatic.  Currently on Aranesp every 2 weeks.  # Hemoglobin is 8.6/ normal white count normal platelets;-proceed with Aranesp today.  Discussed regarding p.o. iron intake Vitron-C.  If  iron level still low would recommend IV iron.  # # I discussed regarding Covid-19 precautions.  I reviewed the vaccine effectiveness and potential side effects in detail.  Also discussed long-term effectiveness and safety profile are unclear at this time.  I discussed December, 2020 [facility] position statement-that all patients are recommended COVID-19 vaccinations [when available]-as long as they do not have allergy to components of the vaccine.  However, I think the benefits of the vaccination outweigh the potential risks.  # DISPOSITION: # Aranesp SQ injection today # in 2 weeks- H&H- Aranesp; # 4 weeks- MD; lab-cbc/bmp/iron studies/ferritin-Aranesp SQ injection- Dr.B  All questions were answered. The patient knows to call the clinic with any problems, questions or concerns.   Cammie Sickle, MD 10/23/2019 10:28 AM

## 2019-10-25 DIAGNOSIS — L570 Actinic keratosis: Secondary | ICD-10-CM | POA: Diagnosis not present

## 2019-10-25 DIAGNOSIS — L280 Lichen simplex chronicus: Secondary | ICD-10-CM | POA: Diagnosis not present

## 2019-11-02 ENCOUNTER — Other Ambulatory Visit: Payer: Self-pay

## 2019-11-05 ENCOUNTER — Inpatient Hospital Stay: Payer: PPO

## 2019-11-05 ENCOUNTER — Other Ambulatory Visit: Payer: Self-pay | Admitting: *Deleted

## 2019-11-05 ENCOUNTER — Inpatient Hospital Stay: Payer: PPO | Attending: Internal Medicine

## 2019-11-05 ENCOUNTER — Other Ambulatory Visit: Payer: Self-pay

## 2019-11-05 VITALS — BP 123/74 | HR 73

## 2019-11-05 DIAGNOSIS — D4620 Refractory anemia with excess of blasts, unspecified: Secondary | ICD-10-CM

## 2019-11-05 DIAGNOSIS — Z79899 Other long term (current) drug therapy: Secondary | ICD-10-CM | POA: Insufficient documentation

## 2019-11-05 DIAGNOSIS — E039 Hypothyroidism, unspecified: Secondary | ICD-10-CM | POA: Diagnosis not present

## 2019-11-05 DIAGNOSIS — R5381 Other malaise: Secondary | ICD-10-CM | POA: Diagnosis not present

## 2019-11-05 DIAGNOSIS — Z803 Family history of malignant neoplasm of breast: Secondary | ICD-10-CM | POA: Diagnosis not present

## 2019-11-05 DIAGNOSIS — R5383 Other fatigue: Secondary | ICD-10-CM | POA: Insufficient documentation

## 2019-11-05 DIAGNOSIS — D649 Anemia, unspecified: Secondary | ICD-10-CM | POA: Insufficient documentation

## 2019-11-05 DIAGNOSIS — D469 Myelodysplastic syndrome, unspecified: Secondary | ICD-10-CM | POA: Diagnosis present

## 2019-11-05 LAB — HEMATOCRIT: HCT: 28.1 % — ABNORMAL LOW (ref 36.0–46.0)

## 2019-11-05 LAB — HEMOGLOBIN: Hemoglobin: 8.7 g/dL — ABNORMAL LOW (ref 12.0–15.0)

## 2019-11-05 MED ORDER — DARBEPOETIN ALFA 200 MCG/0.4ML IJ SOSY
200.0000 ug | PREFILLED_SYRINGE | Freq: Once | INTRAMUSCULAR | Status: AC
  Administered 2019-11-05: 200 ug via SUBCUTANEOUS
  Filled 2019-11-05: qty 0.4

## 2019-11-19 ENCOUNTER — Other Ambulatory Visit: Payer: Self-pay | Admitting: *Deleted

## 2019-11-19 ENCOUNTER — Inpatient Hospital Stay (HOSPITAL_BASED_OUTPATIENT_CLINIC_OR_DEPARTMENT_OTHER): Payer: PPO | Admitting: Internal Medicine

## 2019-11-19 ENCOUNTER — Encounter: Payer: Self-pay | Admitting: Internal Medicine

## 2019-11-19 ENCOUNTER — Inpatient Hospital Stay: Payer: PPO

## 2019-11-19 ENCOUNTER — Other Ambulatory Visit: Payer: Self-pay

## 2019-11-19 DIAGNOSIS — D649 Anemia, unspecified: Secondary | ICD-10-CM | POA: Diagnosis not present

## 2019-11-19 DIAGNOSIS — D4620 Refractory anemia with excess of blasts, unspecified: Secondary | ICD-10-CM

## 2019-11-19 LAB — CBC WITH DIFFERENTIAL/PLATELET
Abs Immature Granulocytes: 0.1 10*3/uL — ABNORMAL HIGH (ref 0.00–0.07)
Basophils Absolute: 0 10*3/uL (ref 0.0–0.1)
Basophils Relative: 1 %
Eosinophils Absolute: 0.1 10*3/uL (ref 0.0–0.5)
Eosinophils Relative: 1 %
HCT: 27.9 % — ABNORMAL LOW (ref 36.0–46.0)
Hemoglobin: 8.6 g/dL — ABNORMAL LOW (ref 12.0–15.0)
Immature Granulocytes: 2 %
Lymphocytes Relative: 34 %
Lymphs Abs: 2.2 10*3/uL (ref 0.7–4.0)
MCH: 31.7 pg (ref 26.0–34.0)
MCHC: 30.8 g/dL (ref 30.0–36.0)
MCV: 103 fL — ABNORMAL HIGH (ref 80.0–100.0)
Monocytes Absolute: 1 10*3/uL (ref 0.1–1.0)
Monocytes Relative: 15 %
Neutro Abs: 3.1 10*3/uL (ref 1.7–7.7)
Neutrophils Relative %: 47 %
Platelets: 230 10*3/uL (ref 150–400)
RBC: 2.71 MIL/uL — ABNORMAL LOW (ref 3.87–5.11)
RDW: 27.8 % — ABNORMAL HIGH (ref 11.5–15.5)
WBC: 6.4 10*3/uL (ref 4.0–10.5)
nRBC: 1.4 % — ABNORMAL HIGH (ref 0.0–0.2)

## 2019-11-19 LAB — BASIC METABOLIC PANEL
Anion gap: 11 (ref 5–15)
BUN: 20 mg/dL (ref 8–23)
CO2: 25 mmol/L (ref 22–32)
Calcium: 9.1 mg/dL (ref 8.9–10.3)
Chloride: 102 mmol/L (ref 98–111)
Creatinine, Ser: 0.8 mg/dL (ref 0.44–1.00)
GFR calc Af Amer: >60 mL/min (ref >60)
GFR calc non Af Amer: >60 mL/min (ref >60)
Glucose, Bld: 142 mg/dL — ABNORMAL HIGH (ref 70–99)
Potassium: 3.8 mmol/L (ref 3.5–5.1)
Sodium: 138 mmol/L (ref 135–145)

## 2019-11-19 LAB — IRON AND TIBC
Iron: 163 ug/dL (ref 28–170)
Saturation Ratios: 58 % — ABNORMAL HIGH (ref 10.4–31.8)
TIBC: 280 ug/dL (ref 250–450)
UIBC: 117 ug/dL

## 2019-11-19 LAB — FERRITIN: Ferritin: 339 ng/mL — ABNORMAL HIGH (ref 11–307)

## 2019-11-19 MED ORDER — DARBEPOETIN ALFA 300 MCG/0.6ML IJ SOSY
300.0000 ug | PREFILLED_SYRINGE | Freq: Once | INTRAMUSCULAR | Status: AC
  Administered 2019-11-19: 15:00:00 300 ug via SUBCUTANEOUS
  Filled 2019-11-19: qty 0.6

## 2019-11-19 NOTE — Assessment & Plan Note (Addendum)
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 POSITIVE  For SF3B1; R-IPSS- Very Low risk.  However hemoglobin slowly trending down/symptomatic.  Currently on Aranesp every 2 weeks.  # Hemoglobin is 8.6/ normal white count normal platelets; worsening.-proceed with Aranesp today; will increase the dose to 300 mcg as no significant response noted so far.  Currently on p.o. iron intake Vitron-C.  If iron level still low would recommend IV iron.  # DISPOSITION: will call re: IV iron if needed.  # Aranesp SQ injection today # in 2 weeks- H&H- Aranesp; # 4 weeks- MD; lab-cbc/bmp; Aranesp SQ injection- Dr.B

## 2019-11-19 NOTE — Progress Notes (Signed)
Carla [name] NOTE  Patient Care Team: [name], [name], MD as PCP - General  CHIEF COMPLAINTS/PURPOSE OF CONSULTATION: Anemia   Oncology History Overview Note   # July 2020-myelodysplastic syndrome with ringed sideroblasts-Normal karyotype; no blasts [IPSS R-Very low risk ~median survival 8.3 years]; iron studies T55 folic acid myeloma panel normal; pyridoxine levels/copper/zinc-WNL. Erythropoietin levels-60. 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  # colonoscopy- 2016 [Dr.Bucinni];  DIAGNOSIS: MDS/low-grade with ringed sideroblasts  STAGE: Low       ;  GOALS: Control  CURRENT/MOST RECENT THERAPY : EPO agent    MDS (myelodysplastic syndrome), low grade ([place])  04/23/2019 Initial Diagnosis   MDS (myelodysplastic syndrome), low grade ([facility])      HISTORY OF PRESENTING ILLNESS:  Carla [name] 69 y.o.  female history of low-grade MDS anemia with ring sideroblasts currently on Aranesp is here for follow-up.  Complains of mild to moderate fatigue.  Otherwise no new shortness of breath or cough and no swelling in the legs.   Review of Systems  Constitutional: Positive for malaise/fatigue. Negative for chills, diaphoresis and fever.  HENT: Negative for nosebleeds and sore throat.   Eyes: Negative for double vision.  Respiratory: Negative for cough, hemoptysis, sputum production, shortness of breath and wheezing.   Cardiovascular: Negative for chest pain, orthopnea and leg swelling.  Gastrointestinal: Negative for abdominal pain, blood in stool, constipation, diarrhea, heartburn, melena and vomiting.  Genitourinary: Negative for dysuria, frequency and urgency.  Musculoskeletal: Negative for back pain and  joint pain.  Skin: Negative.  Negative for itching and rash.  Neurological: Negative for dizziness, tingling, focal weakness, weakness and headaches.  Endo/Heme/Allergies: Does not bruise/bleed easily.  Psychiatric/Behavioral: Negative for depression. The patient is not nervous/anxious and does not have insomnia.     MEDICAL HISTORY:  Past Medical History:  Diagnosis Date  . Allergic rhinitis, cause unspecified   . Anemia, unspecified   . Carpal tunnel syndrome   . Cystitis, unspecified   . Family history of osteoporosis   . PONV (postoperative nausea and vomiting)   . Postnasal drip   . Syncope and collapse     SURGICAL HISTORY: Past Surgical History:  Procedure Laterality Date  . CARPAL TUNNEL RELEASE    . COLONOSCOPY  2005  . COLONOSCOPY WITH PROPOFOL N/A 07/31/2015   Procedure: COLONOSCOPY WITH PROPOFOL;  Surgeon: Ronald Lobo, MD;  Location: [facility];  Service: Endoscopy;  Laterality: N/A;  . DIAGNOSTIC LAPAROSCOPY     dx lack of pregnancy   . EYE SURGERY     lasik, cataract, prk,yag procedure    SOCIAL HISTORY: Social History   Socioeconomic History  . Marital status: Married    Spouse name: Not on file  . Number of children: Not on file  . Years of education: Not on file  . Highest education level: Not on file  Occupational History  . Not on file  Tobacco Use  . Smoking status: Never Smoker  . Smokeless tobacco: Never Used  Substance and Sexual Activity  . Alcohol use: Yes    Alcohol/week: 0.0 standard drinks    Comment: Rare  . Drug use: No  . Sexual activity: Not Currently  Other Topics Concern  . Not on file  Social History Narrative   No regular exercise      Drinks lots of [employer]eps[employer]  Social Determinants of Health   Financial Resource Strain:   . Difficulty of Paying Living Expenses: Not on file  Food Insecurity:   . Worried About Charity fundraiser in the Last Year: Not on file  . Ran Out of Food in the Last Year: Not on file   Transportation Needs:   . Lack of Transportation (Medical): Not on file  . Lack of Transportation (Non-Medical): Not on file  Physical Activity:   . Days of Exercise per Week: Not on file  . Minutes of Exercise per Session: Not on file  Stress:   . Feeling of Stress : Not on file  Social Connections:   . Frequency of Communication with Friends and Family: Not on file  . Frequency of Social Gatherings with Friends and Family: Not on file  . Attends Religious Services: Not on file  . Active Member of Clubs or Organizations: Not on file  . Attends Archivist Meetings: Not on file  . Marital Status: Not on file  Intimate Partner Violence:   . Fear of Current or Ex-Partner: Not on file  . Emotionally Abused: Not on file  . Physically Abused: Not on file  . Sexually Abused: Not on file    FAMILY HISTORY: Family History  Problem Relation Age of Onset  . Osteoporosis Mother   . Stroke Mother   . Coronary artery disease Father   . Stroke Father 76  . Diabetes Other        Aunts and uncles  . Breast cancer Paternal Aunt   . Breast cancer Maternal Aunt   . Arthritis/Rheumatoid Child     ALLERGIES:  is allergic to codeine.  MEDICATIONS:  Current Outpatient Medications  Medication Sig Dispense Refill  . BIOTIN PO Take 1 tablet by mouth daily.    . Calcium Carb-Cholecalciferol (CALCIUM 600+D3 PO) Take by mouth.    . cetirizine (ZYRTEC) 10 MG tablet Take 10 mg by mouth daily as needed for allergies.     . clobetasol cream (TEMOVATE) 0.05 % As directed    . levothyroxine (SYNTHROID) 25 MCG tablet TAKE 1 TABLET BY MOUTH ONCE A DAY. TAKE ON AN EMPTY STOMACH WITH A GLASS OF WATER ATLEAST 30-60 MIN BEFORE BREAKFAST 90 tablet 3  . Sulfacetamide Sodium, Acne, 10 % LOTN As directed    . valACYclovir (VALTREX) 1000 MG tablet Take 1 tablet (1,000 mg total) by mouth 2 (two) times daily. 20 tablet 0   No current facility-administered medications for this visit.      PHYSICAL  EXAMINATION:   Vitals:   11/19/19 1433  BP: 120/73  Pulse: 72  Temp: (!) 96.6 F (35.9 C)   Filed Weights   11/19/19 1433  Weight: 157 lb (71.2 kg)    Physical Exam  Constitutional: She is oriented to person, place, and time and well-developed, well-nourished, and in no distress.  HENT:  Head: Normocephalic and atraumatic.  Mouth/Throat: Oropharynx is clear and moist. No oropharyngeal exudate.  Eyes: Pupils are equal, round, and reactive to light.  Cardiovascular: Normal rate and regular rhythm.  Pulmonary/Chest: No respiratory distress. She has no wheezes.  Abdominal: Soft. Bowel sounds are normal. She exhibits no distension and no mass. There is no abdominal tenderness. There is no rebound and no guarding.  Musculoskeletal:        General: No tenderness or edema. Normal range of motion.     Cervical back: Normal range of motion and neck supple.  Neurological: She is alert  and oriented to person, place, and time.  Skin: Skin is warm.  Psychiatric: Affect normal.    LABORATORY DATA:  I have reviewed the data as listed Lab Results  Component Value Date   WBC 6.4 11/19/2019   HGB 8.6 (L) 11/19/2019   HCT 27.9 (L) 11/19/2019   MCV 103.0 (H) 11/19/2019   PLT 230 11/19/2019   Recent Labs    03/02/19 1101 03/02/19 1101 08/20/19 1357 10/01/19 1418 11/19/19 1411  NA 138   < > 136 137 138  K 4.2   < > 3.8 3.6 3.8  CL 103   < > 103 104 102  CO2 27   < > '25 25 25  ' GLUCOSE 88   < > 159* 166* 142*  BUN 17   < > '17 20 20  ' CREATININE 0.82   < > 0.73 0.83 0.80  CALCIUM 9.1   < > 9.1 9.0 9.1  GFRNONAA  --   --  >60 >60 >60  GFRAA  --   --  >60 >60 >60  PROT 7.4  --   --   --   --   ALBUMIN 4.2  --   --   --   --   AST 14  --   --   --   --   ALT 12  --   --   --   --   ALKPHOS 44  --   --   --   --   BILITOT 0.7  --   --   --   --    < > = values in this interval not displayed.     No results found.  MDS (myelodysplastic syndrome), low grade ([facility]) # Low grade  MDS- with ringed sideroblasts; no blasts. POSITIVE  For SF3B1; R-IPSS- Very Low risk.  However hemoglobin slowly trending down/symptomatic.  Currently on Aranesp every 2 weeks.  # Hemoglobin is 8.6/ normal white count normal platelets; worsening.-proceed with Aranesp today; will increase the dose to 300 mcg as no significant response noted so far.  Currently on p.o. iron intake Vitron-C.  If iron level still low would recommend IV iron.  # DISPOSITION: will call re: IV iron if needed.  # Aranesp SQ injection today # in 2 weeks- H&H- Aranesp; # 4 weeks- MD; lab-cbc/bmp; Aranesp SQ injection- Dr.B  All questions were answered. The patient knows to call the clinic with any problems, questions or concerns.   Cammie Sickle, MD 11/19/2019 2:59 PM

## 2019-12-03 ENCOUNTER — Inpatient Hospital Stay: Payer: PPO | Attending: Internal Medicine

## 2019-12-03 ENCOUNTER — Other Ambulatory Visit: Payer: Self-pay

## 2019-12-03 ENCOUNTER — Inpatient Hospital Stay: Payer: PPO

## 2019-12-03 VITALS — BP 121/73 | HR 73

## 2019-12-03 DIAGNOSIS — M25551 Pain in right hip: Secondary | ICD-10-CM | POA: Diagnosis not present

## 2019-12-03 DIAGNOSIS — D469 Myelodysplastic syndrome, unspecified: Secondary | ICD-10-CM | POA: Insufficient documentation

## 2019-12-03 DIAGNOSIS — D4620 Refractory anemia with excess of blasts, unspecified: Secondary | ICD-10-CM

## 2019-12-03 DIAGNOSIS — M79643 Pain in unspecified hand: Secondary | ICD-10-CM | POA: Diagnosis not present

## 2019-12-03 DIAGNOSIS — R5383 Other fatigue: Secondary | ICD-10-CM | POA: Diagnosis not present

## 2019-12-03 DIAGNOSIS — E039 Hypothyroidism, unspecified: Secondary | ICD-10-CM | POA: Insufficient documentation

## 2019-12-03 DIAGNOSIS — M791 Myalgia, unspecified site: Secondary | ICD-10-CM | POA: Diagnosis not present

## 2019-12-03 DIAGNOSIS — R5381 Other malaise: Secondary | ICD-10-CM | POA: Diagnosis not present

## 2019-12-03 DIAGNOSIS — R600 Localized edema: Secondary | ICD-10-CM | POA: Diagnosis not present

## 2019-12-03 DIAGNOSIS — Z79899 Other long term (current) drug therapy: Secondary | ICD-10-CM | POA: Insufficient documentation

## 2019-12-03 LAB — HEMATOCRIT: HCT: 28.7 % — ABNORMAL LOW (ref 36.0–46.0)

## 2019-12-03 LAB — HEMOGLOBIN: Hemoglobin: 8.9 g/dL — ABNORMAL LOW (ref 12.0–15.0)

## 2019-12-03 MED ORDER — DARBEPOETIN ALFA 300 MCG/0.6ML IJ SOSY
300.0000 ug | PREFILLED_SYRINGE | Freq: Once | INTRAMUSCULAR | Status: AC
  Administered 2019-12-03: 300 ug via SUBCUTANEOUS
  Filled 2019-12-03: qty 0.6

## 2019-12-04 ENCOUNTER — Other Ambulatory Visit: Payer: Self-pay | Admitting: Family Medicine

## 2019-12-10 ENCOUNTER — Encounter: Admit: 2019-12-10 | Discharge: 2019-12-10 | Payer: MEDICARE | Primary: Family

## 2019-12-10 ENCOUNTER — Ambulatory Visit: Admit: 2019-12-10 | Discharge: 2019-12-10 | Payer: MEDICARE | Primary: Family

## 2019-12-10 ENCOUNTER — Ambulatory Visit: Admit: 2019-12-10 | Discharge: 2019-12-11 | Payer: MEDICARE | Primary: Family

## 2019-12-10 DIAGNOSIS — H3530 Unspecified macular degeneration: Secondary | ICD-10-CM

## 2019-12-10 DIAGNOSIS — R3989 Other symptoms and signs involving the genitourinary system: Secondary | ICD-10-CM

## 2019-12-10 DIAGNOSIS — C439 Malignant melanoma of skin, unspecified: Secondary | ICD-10-CM

## 2019-12-10 DIAGNOSIS — C50919 Malignant neoplasm of unspecified site of unspecified female breast: Secondary | ICD-10-CM

## 2019-12-10 DIAGNOSIS — N3941 Urge incontinence: Secondary | ICD-10-CM

## 2019-12-10 DIAGNOSIS — M1990 Unspecified osteoarthritis, unspecified site: Secondary | ICD-10-CM

## 2019-12-10 DIAGNOSIS — M7918 Myalgia, other site: Secondary | ICD-10-CM

## 2019-12-10 DIAGNOSIS — I471 Supraventricular tachycardia: Secondary | ICD-10-CM

## 2019-12-10 DIAGNOSIS — E669 Obesity, unspecified: Secondary | ICD-10-CM

## 2019-12-10 DIAGNOSIS — N9489 Other specified conditions associated with female genital organs and menstrual cycle: Secondary | ICD-10-CM

## 2019-12-10 DIAGNOSIS — R002 Palpitations: Secondary | ICD-10-CM

## 2019-12-10 DIAGNOSIS — R159 Full incontinence of feces: Secondary | ICD-10-CM

## 2019-12-10 DIAGNOSIS — I10 Essential (primary) hypertension: Secondary | ICD-10-CM

## 2019-12-10 MED ORDER — OXYBUTYNIN CHLORIDE 5 MG PO TR24
5 mg | ORAL_TABLET | Freq: Every day | ORAL | 3 refills | 12.00000 days | Status: AC
Start: 2019-12-10 — End: ?

## 2019-12-10 MED ORDER — NITROFURANTOIN MONOHYD/M-CRYST 100 MG PO CAP
100 mg | ORAL_CAPSULE | Freq: Two times a day (BID) | ORAL | 0 refills | 7.00000 days | Status: AC
Start: 2019-12-10 — End: ?

## 2019-12-10 NOTE — Progress Notes
DATE: 12/10/2019  REFERRING PHYSICIAN: Dr Ivin Booty M.[facility]. [facility]      CHIEF COMPLAINT: Urinary Incontinence and New Patient       History of Present Illness  Kaitlyn Friedman is a 69 y.o. G2P2 PMP female seen for consultation regarding urinary incontinence.  She has had a vaginal hysterectomy in 1979 for cancerous cells.     She reports urinary symptoms for 5+ years, worse in past 1-2 years. She reports I have never been able to hold it very long. She reports sensation of incomplete voiding. She has a strong urge to void but then there is just a small trickle. If she sits longer on toilet a small amount more comes out. She denies leakage of urine with coughing, laughing, and sneezing. She reports leakage of urine with an urge. She does wear a pad for protection and changes it about 3-4 times per day. She does have symptoms of urinary urgency and frequency. She reports nocturia 3-4 times per night, goes to bed at 10pm and wakes at 5am. She stops drinking around 5pm. She has not tried overactive bladder medications to help with her symptoms. She states she drinks a soda or water with lunch and a bottle of water between lunch and dinner. She drinks 1-2 servings of caffeine per day (soda or tea).     She denies a history of frequent UTIs.    She reports bowel symptoms including fecal urgency and occasional diarrhea.  She reports fecal incontinence of stool. She does not take fiber supplements but tries to eat dietary fiber. She had ColoGuard, which was normal, due to fear related to the prep and concern for fecal incontinence. She reports a history of an anal sphincter injury at the time of her vaginal delivery at age 8 that was not sewn back together. She reports she underwent an attempted repair of this in 1979 at the time of her hysterectomy but that it didn't work because there was too much scar tissue.     She denies prolapse symptoms. She has not noticed a vaginal bulge that she can see and feel.     She reports a history of right hip injury during water skiing, which causes periodic pain. She reports arthritis in her back, which causes pain. She has not had any procedures or injections. She reports her knees pop out peridically but has not had surgery or procedures on her knees.    She denies menopausal symptoms. She denies vaginal dryness.    She is not sexually active.        She has had a bilateral mastectomy in 2013 for breast cancer but did not need any adjuvant treatment. Follow up exams have been reassuring.  She reports that she did not follow up with Pap smears after her hysterectomy.     REVIEW OF SYSTEMS:  Pertinent Review of Systems:  - General ROS: Denies recent weight change above 10 pounds, Denies malaise  - HEENT ROS: Denies blurring of vision. Denies double vision, Denies glaucoma, Denies dry eyes  - Cardiovascular ROS: Denies chest pain. Denies palpitations currently. Denies orthopnea  - Respiratory ROS: Denies shortness of breath,  Denies cough,  Denies wheezing currently  - Gastrointestinal ROS: Denies constipation, Denies gastric reflux,  Denies blood in stool, Denies diarrhea, Denies Irritable bowel syndrome, Denies nausea, Denies vomiting, Denies abdominal pain  - Endocrine ROS: Denies polydipsia,  Denies excessive sweating. Denies hot flashes. Denies Thyroid disease  - Hematological and Lymphatic ROS: Denies easy bruisability ,  Denies current anemia, Denies adenopathy in the inguinal area  - Neurological ROS: Denies syncope, Denies numbness in lower extremities, Denies neuropathy. Denies shooting pain down the legs, Denies shooting pain in the lower back, Denies low back pain  - Musculoskeletal ROS: Denies Joint pain. Denies Edema in lower extremities. Denies fibromyalgia  - Psychological ROS: Denies depression, Denies anxiety, Denies thoughts of suicide, Denies thoughts of homicide, Denies recent seizure, Denies syncope   - Dermatological ROS: Denies eczema. Denies skin rashes in the groin area and other sites    ALLERGIES:  Patient has no known allergies.    Medical History:   Diagnosis Date   ? Arthritis     R hip, fingers   ? 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 Hypertension    ? Macular degeneration    ? Melanoma (HCC) 2016   ? Obese    ? Palpitations 2015   ? SVT (supraventricular tachycardia) (HCC) 2014    s/p cardioversion       Surgical History:   Procedure Laterality Date   ? HX HYSTERECTOMY  1978    partial   ? HX BREAST BIOPSY Left 2009    chest wall   ? HX BREAST LUMPECTOMY  June 29, 2012    Breast profile revealed 4% estrogen receptor, 3% progesterone receptor and HER-2 was 3+.   ? LYMPH NODE BIOPSY  June 29, 2012   ? BREAST RECONSTRUCTION  08/11/2012   ? HX MASTECTOMY Bilateral Nov. 15, 2013   ? BREAST AUGMENTATION Bilateral 11/2012    mastectomy   ? SOFT TISSUE BIOPSY Left 12/2014    left upper arm   ? WIDE LOCAL EXCISION OF LEFT ARM, INTRAOPERATIVE LYMPHATIC MAPPING Left 02/07/2015    Performed by Lorelei Pont, MD at [facility] OR   ? SENTINEL AXILLARY LYMPH NODE BIOPSY Left 02/07/2015    Per[facility] by L[facility]lei Pont[facility] MD at [facility]   ? EXCISION OF LEFT ARM MASS Left 01/26/2017    Performed by Lorelei Pont, MD at [facility] OR   ? LAPAROSCOPIC RIGHT INGUINAL HERNIA REPAIR WITH MESH Right 05/09/2018    Performed by Geralyn Flash, MD at [facility] OR       Family History   Problem Relation Age of Onset   ? Cancer Sister         Multiple Myeloma dx in her late 50's early 70's   ? Cancer Brother         testicular cancer late 30's to early 78's   ? Cancer-Breast Maternal Aunt         dx in her 68's       Social History     Socioeconomic History   ? Marital status: Divorced     Spouse name: Not on file   ? Number of children: Not on file   ? Years of education: Not on file   ? Highest education level: Not on file   Occupational History   ? Not on file   Tobacco Use   ? Smoking status: Never Smoker   ? Smokeless tobacco: Never Used   Substance and Sexual Activity   ? Alcohol use: Yes     Alcohol/week: 1.0 standard drinks     Types: 1 Cans of beer per week     Comment: on occasion   ? Drug use: No   ?  Sexual activity: Not on file   Other Topics Concern   ? Not on file   Social History Narrative   ? Not on file         Current Outpatient Medications:   ?  Cholecalciferol (Vitamin D3) 2,000 unit cap, take by mouth daily, Disp: 90 Cap, Rfl: 3  ?  cyclobenzaprine (FLEXERIL) 10 mg tablet, , Disp: , Rfl: 0  ?  ibuprofen (MOTRIN) 800 mg tablet, Take 1 tablet by mouth every 6 hours as needed for Pain. Take with food., Disp: 30 tablet, Rfl: 0  ?  lisinopril (PRINIVIL, ZESTRIL) 40 mg tablet, Take 40 mg by mouth at bedtime daily., Disp: , Rfl:   ?  metoprolol XL (TOPROL XL) 50 mg tablet, Take 50 mg by mouth at bedtime daily., Disp: , Rfl:   ?  MULTIVITAMIN PO, Take 1 tablet by mouth daily., Disp: , Rfl:   ?  Vitamin A-Vitamin C-Vit E-Min (PRESERVISION AREDS) cap, Take 1 capsule by mouth daily., Disp: , Rfl:         Objective:         ? Cholecalciferol (Vitamin D3) 2,000 unit cap take by mouth daily   ? cyclobenzaprine (FLEXERIL) 10 mg tablet    ? ibuprofen (MOTRIN) 800 mg tablet Take 1 tablet by mouth every 6 hours as needed for Pain. Take with food.   ? lisinopril (PRINIVIL, ZESTRIL) 40 mg tablet Take 40 mg by mouth at bedtime daily.   ? metoprolol XL (TOPROL XL) 50 mg tablet Take 50 mg by mouth at bedtime daily.   ? MULTIVITAMIN PO Take 1 tablet by mouth daily.   ? Vitamin A-Vitamin C-Vit E-Min (PRESERVISION AREDS) cap Take 1 capsule by mouth daily.     Vitals:    12/10/19 0858 12/10/19 0900   BP: (!) 160/79 (!) 168/75   Pulse: 50    Resp: 12    Temp: 36.6 ?C (97.9 ?F)    SpO2: 98%    Weight: 110.2 kg (242 lb 14.4 oz)    Height: 165.1 cm (65)    PainSc: Zero      Body mass index is 40.42 kg/m?Marland Kitchen     Physical Exam    General appearance: not in acute distress, walking with normal gait  Mental status :oriented to time, place and person; affect and mood appropriate; normal interaction  Cardiovascular: normal heart rate, bilateral LE pulses palpable  Chest / Respiratory: normal respiratory effort, unlabored breathing   Back: No spine tenderness, no SI joint tenderness, No CVAT   Neck: No thryromegally, no adenopathy  Gastrointestinal / Abdomen: no masses, no rebound, no tenderness, soft, nondistended, no hernias,  Laparoscopic (inguinal hernia repair w/mesh) incisions without tenderness   Extremities: no gross deformities, normal joint mobility   Neruologic: no asymmetric weakness in LE, no abnormalities in sensation in LE  Peripheral vascular system: no cyanosis, 1+ LE edema bilaterally, extremities warm  Dermatological: no perineal nevi or rashes noted   Genitourinary:    Post Void Residual  (ml)  Void (spontaneous)       PVR by bladder scan 28 mL        Urinalysis  Leuks                 2+      Nitrites                 +      Heme  neg                      (one choice per column)  Ext. Genitalia      Lesions noted No    Appearance consistent with age Yes   Vagina       Discharge Present No    Mucosa Atrophic Yes    Tissue Friable No    Tissue Tender To Palpation. No    Bladder      Masses Noted No    Tender to Palpation See below   Urethra      Midline Yes    Normal Size Yes    Urethral Tenderness On Palpation See below    Urethral Discharge Noted No    Any Lesions Noted. No     Caruncle No   Uterus        Normal Size S/p hyst    Mobile S/p hyst    Tender S/p hyst   Adnexa       Masses palpated  None    Tenderness noted NT   Bimanual       Masses palpated  None   Perineum       visually intact. Yes    Anus       External Hemorrhoids None    Anal tone Absent sphincter anteriorly with perineal body measuring approximately 1cm   Myofascial Tenderness      Rt OI  5-6/10       Rt iliococcygeus  6-7/10       Rt puboccygeus/puborectalis  0/10       Posterior vaginal  0/10       Lt pubococcygeus/puborectalis  0/10       Lt iliococcygeus  1/10       Lt OI  4/10       Bladder  0/10       Urethra  0/10       Lt vulva  0/10       Rt vulva  0/10       Reflexes Bulbocavernosus Present    Anal wink Present         [place] Squeeze strength: (0-5) 2 out of 5    Grade 0:  No discernible contraction Grade 1:   Very weak contraction, a?flicker?Marland Kitchen .  Grade 2:   Weak contraction Grade 3: Moderate contraction with some squeeze and lift ability. Grade 4 :   Good contraction; squeeze and lift against resistance.  Grade 5: Strong contraction, squeeze and lift against strong resistance.         Continence Stress Test (supine stress test)  negative      Pelvic Organ Prolapse Quantification (POP-Q): good support       ASSESSMENT:  Kaitlyn Friedman is a 69 y.o. 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    CONSULTATION/PLAN:  Urgency urinary incontinence:   - Management options for urgency urinary incontinence were discussed including expectant management, conservative management (pelvic floor physical therapy and behavioral modification), medical management (overactive bladder medications), and surgical/procedural management (Posterior tibial nerve stimulation, Interstim sacral neuromodulation, intravesicular botulinum toxin injection).   - I reviewed the behavior modification recommendations for urgency, frequency and urge incontinence inclusive of: modification of fluid intake (goal 50-60 ounces per day), avoidance of bladder irritants specifically caffeine and artificial sweeteners, scheduled  voids q 2-3 hours as tolerated, freeze and squeeze strategies and pelvic muscle strengthening.  - A urine culture is being sent today to rule out a UTI as a possible cause of her symptoms. Urine dipstick notable for +nits, 2+ LE suspicious for acute cystitis. Macrobid sent to pharmacy for empiric treatment.    - She will begin to incorporate the behavioral modifications discussed above.  - We discussed the role that her weight Mehringer be playing in her urinary symptoms and overall health. We also discussed the impact of obesity on overall morbidity. We discussed that a 5-10% weight loss can improve urinary symptoms. Weight management options were discussed. She is working on weight loss with her PCP.  - We discussed the use of compression stockings or elevating feet during the day given LE edema  - We discussed that she is likely to benefit from pelvic floor physical therapy for her urinary symptoms, pelvic floor myofascial pain, and fecal incontinence. She lives in [place] and is not interested in pursuing this therapy as she feels like she would be unable to travel to PT visits and is not interested.    - She desires a trial of medications. Risks, including risk of dry mouth/dry eyes/constipation and cognitive dysfunction with long-term use, as well as benefits and alternative were discussed with the patient. She desires a trial of medication. A prescription for oxybutynin was sent to her pharmacy.   - Follow-up in 6-8 weeks for med check.    Fecal Incontinence:   - Management options were discussed for fecal incontinence including stool bulking with fiber to obtain more consistent firm stool, physical therapy, as well as surgical management (Interstim therapy, anal sphincteroplasty).   - Examination today notable for sphincter defect with minimal residual perineal body. She has undergone some kind of attempted repair at the time of her hysterectomy but was told there is too much scar tissue to repair.   - We discussed further investigation of the extent of the sphincter injury with endoanal [place], which would be helpful if she is considering pursuing surgical management. She is not interested in pursuing this today as she is not ready to consider surgical management.   - She is not interested in PFPT as above   - Recommend she begin fiber supplementation to further bulk stools

## 2019-12-10 NOTE — Progress Notes
Pertinent Review of Systems:  - General ROS: Denies recent weight change above 10 pounds, Denies malaise  - HEENT ROS: Denies blurring of vision. Denies double vision, Denies glaucoma, Denies dry eyes  - Cardiovascular ROS: Denies chest pain. Denies palpitations currently. Denies orthopnea  - Respiratory ROS: Denies shortness of breath,  Denies cough,  Denies wheezing currently  - Gastrointestinal ROS: Denies constipation, Denies gastric reflux,  Denies blood in stool, Denies diarrhea, Denies Irritable bowel syndrome, Denies nausea, Denies vomiting, Denies abdominal pain  - Endocrine ROS: Denies polydipsia,  Denies excessive sweating. Denies hot flashes. Denies Thyroid disease  - Hematological and Lymphatic ROS: Denies easy bruisability , Denies current anemia, Denies adenopathy in the inguinal area  - Neurological ROS: Denies syncope, Denies numbness in lower extremities, Denies neuropathy. Denies shooting pain down the legs, Denies shooting pain in the lower back, Denies low back pain  - Musculoskeletal ROS: Denies Joint pain. Denies Edema in lower extremities. Denies fibromyalgia  - Psychological ROS: Denies depression, Denies anxiety, Denies thoughts of suicide, Denies thoughts of homicide, Denies recent seizure, Denies syncope   - Dermatological ROS: Denies eczema. Denies skin rashes in the groin area and other sites

## 2019-12-10 NOTE — Procedures
Within 10 minutes of voiding, a bladder scan was performed and showed a PVR of 28   mL.

## 2019-12-10 NOTE — Patient Instructions
Thank you for visiting the [facility].   We strive to provide personalized and compassionate care.  We look forward to taking care of you!        The Urogynecology Team,     Providers:       Robbie Lis, MD, Acie Fredrickson MD &   Benjaman Pott, [name]     Nurses:       Herbert Seta, LPN, [facility], RN, & Oneida Alar, RN       How to contact us:   [phone] is the number to call to reach [place] directly for medical questions or future scheduling needs.  Please save this number in your phone in case you need to contact us during office hours.      Urogynecology is a surgical subspecialty requiring Dr. Enis Gash and Dr. Randell Patient to be in the OR several days of the week.  During these days the nursing staff and [facility], [name] are available for phone calls and questions.    Dr. Randell Patient is in the office seeing patients in clinic on Mondays, Wednesday afternoons, alternating Thursday mornings, and alternating Fridays. Dr. Enis Gash is in the office seeing patients in clinic on Tuesdays, Thursdays and alternating Fridays.  Monica Hand, [name] is in the office seeing patients in clinic on Mondays, Tuesdays, Wednesday mornings, Thursday, and alternating Fridays.  Due to our clinic volume, our entire staff is providing patient care on these days.  Urgent messages will be returned.  Non-urgent calls will be addressed by the end of the next business day.      Messages left after 1:00 pm Deen not be answered until the following business day.    For any emergency outside of normal business hours, please call [phone] and request to have the on-call Gynecologist paged.    How to get a medication refill:   It you are requesting a medication refill through your pharmacy, please call our office directly [phone] so we can facilitate the refill.  Please leave the name of the medication, dosing and pharmacy information.  Our office will give you a call once the refill request has been completed. How to get approval for a medication insurance is denying:   If a medication is prescribed and insurance is denying coverage for the drug, you have the option of paying for the drug out of pocket.  [employer] company can deny payment, but can not reverse the Health Care Provider's order.       More frequently we are seeing insurance denials that we can not consistently get approved. Good Rx (www.goodrx.com) is an online site that can save up to 80% off of your medication cost.       A denial letter from your insurance company can be faxed from your pharmacy to [place] at [phone] (fax).       If you receive the denial letter directly,  you can fax it to [place] [phone] (fax), or bring it  to your next appointment at our office and discuss it with one of the nurses.    Who do I talk to if I have questions about my bill?  If you need to reach our Billing Department for questions about your bill, call [phone].      How to receive your test results:  You should expect to receive urine culture results within 72 hours.  OneSwab vaginal cultures and blood work can take up to 2 weeks.  Please call our  office [phone] if you have not received your results within this time frame.    Scheduling:  Our scheduling phone number is [phone].    Appointment Reminders on your cell phone: Make sure we have your cell phone number, and Text [place] to [phone].  ________________________________________________________________________    We have three locations where we see our patients.  Be sure to check your next appointment location.   [facility]    [address]    [place], [place] [address]     MAIN CAMPUS/ MOB (Medical Office Building at the [facility])    [address]    [place], [place] [address]      [facility]    [address]    [place], [place]     If you are scheduled to see [place] at the Medical Office Building on the [employer] the new address on GPS will take you directly to the [employer] immediately across the street from our office at the [employer] Building           _________________________________________________________________________    Interventions to help with bladder symptoms:    1. Monitoring fluid intake, goal TOTAL fluid intake 50-60oz/day  2. Avoidance of bladder irritants (caffeine, artifical sweeteners, carbonated beverages, acidic foods and beverages)  3. Weight loss  4. Compression stockings or keeping feet elevated during the day to reduce swelling in your legs  5. Pelvic floor physical therapy. This will likely help with your urinary symptoms, pain, and stool symptoms. Consider this option and notify me of your decision.    It looks like you have a bladder infection (UTI) today. I prescribed an antibiotic (Macrobid).

## 2019-12-11 DIAGNOSIS — R3989 Other symptoms and signs involving the genitourinary system: Secondary | ICD-10-CM

## 2019-12-11 DIAGNOSIS — R32 Unspecified urinary incontinence: Secondary | ICD-10-CM

## 2019-12-11 DIAGNOSIS — R339 Retention of urine, unspecified: Secondary | ICD-10-CM

## 2019-12-12 ENCOUNTER — Encounter: Admit: 2019-12-12 | Discharge: 2019-12-12 | Payer: MEDICARE | Primary: Family

## 2019-12-12 NOTE — Telephone Encounter
-----   Message from Marciano Sequin, MD sent at 12/12/2019  8:16 AM CDT -----  Urine culture positive. Please call patient with positive result. She was given Macrobid for empiric treatment on Monday and should complete the course of the antibiotic.

## 2019-12-12 NOTE — Telephone Encounter
Called patient and reviewed positive results of urine culture.  She states she started the medication last night.  We reviewed the importance of taking all the doses until completed.  Patient states understanding and will take all until gone.  She reports that she decided to not take the oxybutynin as she didn't;t want to have memory loss.  I suggested that one side effect could be memory loss.  She stated the doctor told her she would most definitely have memory loss and she decided not to take it.  She said she would discuss at her follow up visit.  She is scheduled as a telephone follow up to discuss medication.  Should she keep scheduled appt?

## 2019-12-13 ENCOUNTER — Inpatient Hospital Stay (HOSPITAL_BASED_OUTPATIENT_CLINIC_OR_DEPARTMENT_OTHER): Payer: PPO | Admitting: Nurse Practitioner

## 2019-12-13 ENCOUNTER — Telehealth: Payer: Self-pay | Admitting: *Deleted

## 2019-12-13 DIAGNOSIS — M791 Myalgia, unspecified site: Secondary | ICD-10-CM

## 2019-12-13 NOTE — Telephone Encounter (Signed)
Carla [name]- I would recommend [facility] visit [[place] be virtual] for further investigation. Thanks GB

## 2019-12-13 NOTE — Telephone Encounter (Signed)
Patient called reporting terrible bone pain from her aranesp injections. She has been taking Claritin and ibuprofen for it and it is not helping with the pain. She is asking what to do for relief. Please advise

## 2019-12-13 NOTE — Telephone Encounter (Signed)
Patient accepted MyChart virtual visit for 3:45 today.

## 2019-12-13 NOTE — Telephone Encounter (Signed)
Carla [name]- could you set her up for virtual [facility] visit either today or tomorrow? Thanks!

## 2019-12-13 NOTE — Progress Notes (Signed)
Virtual Visit Progress Note  Symptom Management [place]  Telephone:([phone] [phone]  I connected with Carla [name] on 12/13/19 at  3:45 PM EDT by video enabled telemedicine visit and verified that I am speaking with the correct person using two identifiers.   I discussed the limitations, risks, security and privacy concerns of performing an evaluation and management service by telemedicine and the availability of in-person appointments. I also discussed with the patient that there [place] be a patient responsible charge related to this service. The patient expressed understanding and agreed to proceed.   Other persons participating in the visit and their role in the encounter: None  Patient's location: Home Provider's location: Clinic  Chief Complaint: Pain    Patient Care Team: [name], [name], MD as PCP - General Cammie Sickle, MD as Consulting Physician (Hematology and Oncology)   Name of the patient: Carla [name]  [id number]  1951/04/20   Date of visit: 12/13/19  Diagnosis-low-grade MDS   Chief complaint/ Reason for visit-pain  Heme/Onc history:  Oncology History Overview Note   # July 2020-myelodysplastic syndrome with ringed sideroblasts-Normal karyotype; no blasts [IPSS R-Very low risk ~median survival 8.3 years]; iron studies A21 folic acid myeloma panel normal; pyridoxine levels/copper/zinc-WNL. Erythropoietin levels-60. II OPINION at [place] [Dr.DeCastro]  # [facility]/ NGS: TET2([id number])c.2524delT(p.Ser842GlnfsTer31) Exon 3 frame-shift SF3B1([id number])c.2098A>G(p.Lys700Glu) Exon 15 missense  DNMT3A([id number])c.2204A>G(p.Tyr735Cys) Exon 19 missense   # JAN 11th 2020- Aranesp/retacrit  #Mild hypothyroidism-on Synthroid.  # colonoscopy- 2016 [Dr.Bucinni];  DIAGNOSIS: MDS/low-grade with ringed sideroblasts  STAGE: Low       ;  GOALS: Control  CURRENT/MOST RECENT THERAPY : EPO agent    MDS (myelodysplastic  syndrome), low grade (HCC)  04/23/2019 Initial Diagnosis   MDS (myelodysplastic syndrome), low grade [facility])     Interval history- Carla [name], 69 year old female diagnosed with low-grade MDS, volunteering with for anemia, presents to symptom management clinic for pain which started after her most recent dose of Aranesp.  She has been suffering a similar bone type pain with each injections but says pain started Monday and was significant yesterday and last night.  Symptoms have improved today but persist.  Pain in the past occurred intermittently but did not feel significant enough to complain about.  She has been taking Claritin for the last 3 nights along with ibuprofen.  Pain impairs her ability to sleep.  She denies fevers or chills.  Says she feels at baseline otherwise  Review of systems- Review of Systems  Constitutional: Negative for chills, fever, malaise/fatigue and weight loss.  HENT: Negative for hearing loss, nosebleeds, sore throat and tinnitus.   Eyes: Negative for blurred vision and double vision.  Respiratory: Negative for cough, hemoptysis, shortness of breath and wheezing.   Cardiovascular: Negative for chest pain, palpitations and leg swelling.  Gastrointestinal: Negative for abdominal pain, blood in stool, constipation, diarrhea, melena, nausea and vomiting.  Genitourinary: Negative for dysuria and urgency.  Musculoskeletal: Positive for myalgias. Negative for back pain, falls and joint pain.  Skin: Negative for itching and rash.  Neurological: Negative for dizziness, tingling, sensory change, loss of consciousness, weakness and headaches.  Endo/Heme/Allergies: Negative for environmental allergies. Does not bruise/bleed easily.  Psychiatric/Behavioral: Negative for depression. The patient has insomnia. The patient is not nervous/anxious.      Current treatment-Aranesp  Allergies  Allergen Reactions  . Codeine Nausea Only    Past Medical History:  Diagnosis Date  .  Allergic rhinitis, cause unspecified   . Anemia, unspecified   .  Carpal tunnel syndrome   . Cystitis, unspecified   . Family history of osteoporosis   . PONV (postoperative nausea and vomiting)   . Postnasal drip   . Syncope and collapse     Past Surgical History:  Procedure Laterality Date  . CARPAL TUNNEL RELEASE    . COLONOSCOPY  2005  . COLONOSCOPY WITH PROPOFOL N/A 07/31/2015   Procedure: COLONOSCOPY WITH PROPOFOL;  Surgeon: Ronald Lobo, MD;  Location: [facility] END[facility]COPY;  Service: Endoscopy;  Laterality: N/A;  . DIAGNOSTIC LAPAROSCOPY     dx lack of pregnancy   . EYE SURGERY     lasik, cataract, prk,yag procedure    Social History   Socioeconomic History  . Marital status: Married    Spouse name: Not on file  . Number of children: Not on file  . Years of education: Not on file  . Highest education level: Not on file  Occupational History  . Not on file  Tobacco Use  . Smoking status: Never Smoker  . Smokeless tobacco: Never Used  Substance and Sexual Activity  . Alcohol use: Yes    Alcohol/week: 0.0 standard drinks    Comment: Rare  . Drug use: No  . Sexual activity: Not Currently  Other Topics Concern  . Not on file  Social History Narrative   No regular exercise      Drinks lots of [employer]         Social Determinants of Health   Financial Resource Strain:   . Difficulty of Paying Living Expenses:   Food Insecurity:   . Worried About Charity fundraiser in the Last Year:   . Arboriculturist in the Last Year:   Transportation Needs:   . Film/video editor (Medical):   Marland Kitchen Lack of Transportation (Non-Medical):   Physical Activity:   . Days of Exercise per Week:   . Minutes of Exercise per Session:   Stress:   . Feeling of Stress :   Social Connections:   . Frequency of Communication with Friends and Family:   . Frequency of Social Gatherings with Friends and Family:   . Attends Religious Services:   . Active Member of Clubs or Organizations:   .  Attends Archivist Meetings:   Marland Kitchen Marital Status:   Intimate Partner Violence:   . Fear of Current or Ex-Partner:   . Emotionally Abused:   Marland Kitchen Physically Abused:   . Sexually Abused:     Family History  Problem Relation Age of Onset  . Osteoporosis Mother   . Stroke Mother   . Coronary artery disease Father   . Stroke Father 46  . Diabetes Other        Aunts and uncles  . Breast cancer Paternal Aunt   . Breast cancer Maternal Aunt   . Arthritis/Rheumatoid Child      Current Outpatient Medications:  .  BIOTIN PO, Take 1 tablet by mouth daily., Disp: , Rfl:  .  Calcium Carb-Cholecalciferol (CALCIUM 600+D3 PO), Take by mouth., Disp: , Rfl:  .  cetirizine (ZYRTEC) 10 MG tablet, Take 10 mg by mouth daily as needed for allergies. , Disp: , Rfl:  .  clobetasol cream (TEMOVATE) 0.05 %, As directed, Disp: , Rfl:  .  levothyroxine (SYNTHROID) 25 MCG tablet, TAKE 1 TABLET BY MOUTH ONCE A DAY. TAKE ON AN EMPTY STOMACH WITH A GLASS OF WATER ATLEAST 30-60 MIN BEFORE BREAKFAST, Disp: 90 tablet, Rfl: 3 .  Sulfacetamide Sodium, Acne,  10 % LOTN, As directed, Disp: , Rfl:  .  valACYclovir (VALTREX) 1000 MG tablet, Take 1 tablet (1,000 mg total) by mouth 2 (two) times daily., Disp: 20 tablet, Rfl: 0  Physical exam: Exam limited due to telemedicine  There were no vitals filed for this visit. Physical Exam Constitutional:      General: She is not in acute distress. Neurological:     Mental Status: She is alert.  Psychiatric:        Mood and Affect: Mood normal.        Behavior: Behavior normal.      CMP Latest Ref Rng & Units 11/19/2019  Glucose 70 - 99 mg/dL 142(H)  BUN 8 - 23 mg/dL 20  Creatinine 0.44 - 1.00 mg/dL 0.80  Sodium 135 - 145 mmol/L 138  Potassium 3.5 - 5.1 mmol/L 3.8  Chloride 98 - 111 mmol/L 102  CO2 22 - 32 mmol/L 25  Calcium 8.9 - 10.3 mg/dL 9.1  Total Protein 6.0 - 8.3 g/dL -  Total Bilirubin 0.2 - 1.2 mg/dL -  Alkaline Phos 39 - 117 U/L -  AST 0 - 37 U/L  -  ALT 0 - 35 U/L -   CBC Latest Ref Rng & Units 12/03/2019  WBC 4.0 - 10.5 K/uL -  Hemoglobin 12.0 - 15.0 g/dL 8.9(L)  Hematocrit 36.0 - 46.0 % 28.7(L)  Platelets 150 - 400 K/uL -    No images are attached to the encounter.  No results found.  Assessment and plan- Patient is a 69 y.o. female diagnosed with low-grade MDS, currently on Aranesp for symptomatic anemia, who presents to symptom management clinic for myalgias.  1. Arthralgias & Myalgias- likely secondary to bone marrow stimulating effect of aranesp. Symptoms improving. Discussed alternating tylenol and ibuprofen. Ok to take tylenol #3 that she has on-hand for pain unrelieved by tylenol and ibuprofen. Advised not to exceed 3000 mg of tylenol in 24 hour period. Risks of codeine discussed, not to drive, [place] make drowsy. Continue claritin. Consider tramadol if symptoms refractory to tylenol 3.    Follow up with Dr. Rogue Bussing as scheduled. RTC sooner if symptoms don't improve or worsen.    Visit Diagnosis 1. Myalgia    Patient expressed understanding and was in agreement with this plan. She also understands that She can call clinic at any time with any questions, concerns, or complaints.   I discussed the assessment and treatment plan with the patient. The patient was provided an opportunity to ask questions and all were answered. The patient agreed with the plan and demonstrated an understanding of the instructions.   The patient was advised to call back or seek an in-person evaluation if the symptoms worsen or if the condition fails to improve as anticipated.   I provided 20 minutes of face-to-face video visit time during this encounter, and > 50% was spent counseling as documented under my assessment & plan.  Thank you for allowing me to participate in the care of this very pleasant patient.   Beckey Rutter, DNP, AGNP-C [place] [facility] [place]: Patient called cancer center on 12/14/2019 [facility] pain was  not relieved with Tylenol 3.  Advised her to stop and prescription for tramadol sent.  If symptoms persist, please call cancer center for reevaluation.  CC: Dr. Rogue Bussing  CC:

## 2019-12-14 ENCOUNTER — Telehealth: Payer: Self-pay | Admitting: *Deleted

## 2019-12-14 MED ORDER — TRAMADOL HCL 50 MG PO TABS: 50.0000 mg | ORAL_TABLET | Freq: Four times a day (QID) | ORAL | 0 refills | Status: DC | PRN

## 2019-12-14 NOTE — Telephone Encounter (Signed)
Patient called requesting that you send prescription in for Tramadol as you were going to yesterday, the Percocet she had is not helping with her pain. Please advise

## 2019-12-14 NOTE — Telephone Encounter (Signed)
She was taking tylenol #3, not percocet. I have sent tramadol prescription in. Please let me know if symptoms do not improve. Thanks!

## 2019-12-17 ENCOUNTER — Ambulatory Visit
Admission: RE | Admit: 2019-12-17 | Discharge: 2019-12-17 | Disposition: A | Discharge status: Home / Self Care | Payer: PPO | Attending: Internal Medicine | Admitting: Internal Medicine

## 2019-12-17 ENCOUNTER — Ambulatory Visit
Admission: RE | Admit: 2019-12-17 | Discharge: 2019-12-17 | Disposition: A | Discharge status: Home / Self Care | Payer: PPO | Source: Ambulatory Visit | Attending: Internal Medicine | Admitting: Internal Medicine

## 2019-12-17 ENCOUNTER — Inpatient Hospital Stay: Payer: PPO

## 2019-12-17 ENCOUNTER — Inpatient Hospital Stay (HOSPITAL_BASED_OUTPATIENT_CLINIC_OR_DEPARTMENT_OTHER): Payer: PPO | Admitting: Internal Medicine

## 2019-12-17 ENCOUNTER — Other Ambulatory Visit: Payer: Self-pay

## 2019-12-17 ENCOUNTER — Encounter: Payer: Self-pay | Admitting: Internal Medicine

## 2019-12-17 VITALS — BP 115/51 | HR 69 | Temp 97.6°F | Wt 159.6 lb

## 2019-12-17 DIAGNOSIS — M25551 Pain in right hip: Secondary | ICD-10-CM | POA: Insufficient documentation

## 2019-12-17 DIAGNOSIS — D4620 Refractory anemia with excess of blasts, unspecified: Secondary | ICD-10-CM

## 2019-12-17 DIAGNOSIS — D469 Myelodysplastic syndrome, unspecified: Secondary | ICD-10-CM | POA: Diagnosis not present

## 2019-12-17 LAB — CBC WITH DIFFERENTIAL/PLATELET
Abs Immature Granulocytes: 0.11 10*3/uL — ABNORMAL HIGH (ref 0.00–0.07)
Basophils Absolute: 0 10*3/uL (ref 0.0–0.1)
Basophils Relative: 1 %
Eosinophils Absolute: 0.1 10*3/uL (ref 0.0–0.5)
Eosinophils Relative: 1 %
HCT: 26.8 % — ABNORMAL LOW (ref 36.0–46.0)
Hemoglobin: 8.5 g/dL — ABNORMAL LOW (ref 12.0–15.0)
Immature Granulocytes: 2 %
Lymphocytes Relative: 37 %
Lymphs Abs: 2.3 10*3/uL (ref 0.7–4.0)
MCH: 32.6 pg (ref 26.0–34.0)
MCHC: 31.7 g/dL (ref 30.0–36.0)
MCV: 102.7 fL — ABNORMAL HIGH (ref 80.0–100.0)
Monocytes Absolute: 0.9 10*3/uL (ref 0.1–1.0)
Monocytes Relative: 14 %
Neutro Abs: 2.9 10*3/uL (ref 1.7–7.7)
Neutrophils Relative %: 45 %
Platelets: 204 10*3/uL (ref 150–400)
RBC: 2.61 MIL/uL — ABNORMAL LOW (ref 3.87–5.11)
RDW: 28.2 % — ABNORMAL HIGH (ref 11.5–15.5)
WBC: 6.2 10*3/uL (ref 4.0–10.5)
nRBC: 1.3 % — ABNORMAL HIGH (ref 0.0–0.2)

## 2019-12-17 LAB — BASIC METABOLIC PANEL
Anion gap: 9 (ref 5–15)
BUN: 20 mg/dL (ref 8–23)
CO2: 26 mmol/L (ref 22–32)
Calcium: 9 mg/dL (ref 8.9–10.3)
Chloride: 102 mmol/L (ref 98–111)
Creatinine, Ser: 0.88 mg/dL (ref 0.44–1.00)
GFR calc Af Amer: >60 mL/min (ref >60)
GFR calc non Af Amer: >60 mL/min (ref >60)
Glucose, Bld: 122 mg/dL — ABNORMAL HIGH (ref 70–99)
Potassium: 3.9 mmol/L (ref 3.5–5.1)
Sodium: 137 mmol/L (ref 135–145)

## 2019-12-17 NOTE — Progress Notes (Signed)
Reports new onset of right hip back pain that radiates down to the knee.  She states that this pain started after her last aranesp and has last.x 1 week.   She states that Beckey Rutter, NP called in tramadol script for the patient. The tramadol did not help as well, so she states that she had an old script for norco. She took 2 tablets of Tylenol#3 on Saturday. Pain resolved, but came back again on Sunday. She took 2 tablets of Tylenol #3 on Sunday am and pain did not get controled until 3pm. Pain is worse with ambulation. She took a dose of tramadol with food this morning. The pain at this time is not as intense. Pain rating at this time is 1/10. Patient denies any trauma to this hip.  She is also inquiring about the timing of her shingrix vaccine. She has had her last covid vaccine on 11/23/2019

## 2019-12-17 NOTE — Assessment & Plan Note (Addendum)
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 POSITIVE  For SF3B1; R-IPSS- Very Low risk.  However hemoglobin slowly trending down/symptomatic.  Currently on Aranesp every 2 weeks.  # Hemoglobin is 8.5/ normal white count normal platelets; clinically no improvement noted.  Patient currently on 300 mcg [increased from 200 mcg approximately 1 month ago].  Patient concerned that increased dose of Aranesp is causing her symptoms-right hip pain [see below].  Currently on p.o. iron intake Vitron-C. Feb 2021- Iron sat- 58%.   # Right hip pain-unclear etiology clinically less likely from Aranesp however patient is concerned.  Hold Aranesp; check x-rays.  #I also reach out to Dr. Leretha Pol at [facility] previously consulted].  Also encouraged the patient to reach out to [facility].  # DISPOSITION:  # X- -ray today # HOLD  Aranesp today # in 2 weeks; MD; labs- H&H- Aranesp SQ- Dr.B

## 2019-12-17 NOTE — Progress Notes (Signed)
[name] CONSULT NOTE  Patient Care Team: [name], [name], MD as PCP - General Rogue Bussing, [name], MD as Consulting Physician (Hematology and Oncology)  CHIEF COMPLAINTS/PURPOSE OF CONSULTATION: Anemia   Oncology History Overview Note   # July 2020-myelodysplastic syndrome with ringed sideroblasts-Normal karyotype; no blasts [IPSS R-Very low risk ~median survival 8.3 years]; iron studies L39 folic acid myeloma panel normal; pyridoxine levels/copper/zinc-WNL. Erythropoietin levels-60. 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  # colonoscopy- 2016 [Dr.Bucinni];  DIAGNOSIS: MDS/low-grade with ringed sideroblasts  STAGE: Low       ;  GOALS: Control  CURRENT/MOST RECENT THERAPY : EPO agent    MDS (myelodysplastic syndrome), low grade ([place])  04/23/2019 Initial Diagnosis   MDS (myelodysplastic syndrome), low grade ([facility])      HISTORY OF PRESENTING ILLNESS:  Carla [name] 69 y.o.  female history of low-grade MDS anemia with ring sideroblasts currently on Aranesp is here for follow-up.  Patient's dose of Aranesp was increased from 240mg to 300 mcg approximately month ago.  Increase was done given lack of improvement of hemoglobin.   Since increasing the dose patient noted to have worsening hip pain.  Which progressively got worse after the last dose approximately 2 weeks ago.  It radiates to her right thigh.  Worse with movement.  Patient has been alternating with tramadol/narcotic.  She is also gaining weight.  Mild to moderate fatigue.  Also complains of swelling in the legs.  Review of Systems  Constitutional: Positive for malaise/fatigue. Negative for chills, diaphoresis and fever.  HENT: Negative for  nosebleeds and sore throat.   Eyes: Negative for double vision.  Respiratory: Negative for cough, hemoptysis, sputum production, shortness of breath and wheezing.   Cardiovascular: Positive for leg swelling. Negative for chest pain and orthopnea.  Gastrointestinal: Negative for abdominal pain, blood in stool, constipation, diarrhea, heartburn, melena and vomiting.  Genitourinary: Negative for dysuria, frequency and urgency.  Musculoskeletal: Positive for back pain and joint pain.  Skin: Negative.  Negative for itching and rash.  Neurological: Negative for dizziness, tingling, focal weakness, weakness and headaches.  Endo/Heme/Allergies: Does not bruise/bleed easily.  Psychiatric/Behavioral: Negative for depression. The patient is not nervous/anxious and does not have insomnia.     MEDICAL HISTORY:  Past Medical History:  Diagnosis Date  . Allergic rhinitis, cause unspecified   . Anemia, unspecified   . Carpal tunnel syndrome   . Cystitis, unspecified   . Family history of osteoporosis   . PONV (postoperative nausea and vomiting)   . Postnasal drip   . Syncope and collapse     SURGICAL HISTORY: Past Surgical History:  Procedure Laterality Date  . CARPAL TUNNEL RELEASE    . COLONOSCOPY  2005  . COLONOSCOPY WITH PROPOFOL N/A 07/31/2015   Procedure: COLONOSCOPY WITH PROPOFOL;  Surgeon: RRonald Lobo MD;  Location: [facility];  Service: Endoscopy;  Laterality: N/A;  . DIAGNOSTIC LAPAROSCOPY     dx lack of pregnancy   . EYE SURGERY     lasik, cataract, prk,yag procedure    SOCIAL HISTORY: Social History   Socioeconomic History  . Marital status: Married    Spouse name: Not on file  . Number of children: Not on file  . Years of education: Not on file  . Highest education level: Not on file  Occupational History  . Not  on file  Tobacco Use  . Smoking status: Never Smoker  . Smokeless tobacco: Never Used  Substance and Sexual Activity  . Alcohol use: Yes     Alcohol/week: 0.0 standard drinks    Comment: Rare  . Drug use: No  . Sexual activity: Not Currently  Other Topics Concern  . Not on file  Social History Narrative   No regular exercise      Drinks lots of [employer]eps[employer]         Social Determinants of Health   Financial Resource Strain:   . Difficulty of Paying Living Expenses:   Food Insecurity:   . Worried About Charity fundraiser in the Last Year:   . Arboriculturist in the Last Year:   Transportation Needs:   . Film/video editor (Medical):   Marland Kitchen Lack of Transportation (Non-Medical):   Physical Activity:   . Days of Exercise per Week:   . Minutes of Exercise per Session:   Stress:   . Feeling of Stress :   Social Connections:   . Frequency of Communication with Friends and Family:   . Frequency of Social Gatherings with Friends and Family:   . Attends Religious Services:   . Active Member of Clubs or Organizations:   . Attends Archivist Meetings:   Marland Kitchen Marital Status:   Intimate Partner Violence:   . Fear of Current or Ex-Partner:   . Emotionally Abused:   Marland Kitchen Physically Abused:   . Sexually Abused:     FAMILY HISTORY: Family History  Problem Relation Age of Onset  . Osteoporosis Mother   . Stroke Mother   . Coronary artery disease Father   . Stroke Father 52  . Diabetes Other        Aunts and uncles  . Breast cancer Paternal Aunt   . Breast cancer Maternal Aunt   . Arthritis/Rheumatoid Child     ALLERGIES:  is allergic to codeine.  MEDICATIONS:  Current Outpatient Medications  Medication Sig Dispense Refill  . acetaminophen-codeine (TYLENOL #3) 300-30 MG tablet Take 1-2 tablets by mouth every 6 (six) hours as needed for moderate pain.    Marland Kitchen BIOTIN PO Take 1 tablet by mouth daily.    . Calcium Carb-Cholecalciferol (CALCIUM 600+D3 PO) Take by mouth.    . cetirizine (ZYRTEC) 10 MG tablet Take 10 mg by mouth daily as needed for allergies.     . clobetasol cream (TEMOVATE) 0.05 % As directed    .  levothyroxine (SYNTHROID) 25 MCG tablet TAKE 1 TABLET BY MOUTH ONCE A DAY. TAKE ON AN EMPTY STOMACH WITH A GLASS OF WATER ATLEAST 30-60 MIN BEFORE BREAKFAST 90 tablet 3  . Sulfacetamide Sodium, Acne, 10 % LOTN As directed    . traMADol (ULTRAM) 50 MG tablet Take 1 tablet (50 mg total) by mouth every 6 (six) hours as needed for moderate pain or severe pain. 30 tablet 0   No current facility-administered medications for this visit.      PHYSICAL EXAMINATION:   Vitals:   12/17/19 1355  BP: (!) 115/51  Pulse: 69  Temp: 97.6 F (36.4 C)  SpO2: 97%   Filed Weights   12/17/19 1355  Weight: 159 lb 9.6 oz (72.4 kg)    Physical Exam  Constitutional: She is oriented to person, place, and time and well-developed, well-nourished, and in no distress.  HENT:  Head: Normocephalic and atraumatic.  Mouth/Throat: Oropharynx is clear and moist. No oropharyngeal exudate.  Eyes:  Pupils are equal, round, and reactive to light.  Cardiovascular: Normal rate and regular rhythm.  Pulmonary/Chest: Effort normal and breath sounds normal. No respiratory distress. She has no wheezes.  Abdominal: Soft. Bowel sounds are normal. She exhibits no distension and no mass. There is no abdominal tenderness. There is no rebound and no guarding.  Musculoskeletal:        General: Edema present. No tenderness. Normal range of motion.     Cervical back: Normal range of motion and neck supple.     Comments: Pain in the right hip on raising the leg.  Neurological: She is alert and oriented to person, place, and time.  Skin: Skin is warm.  Psychiatric: Affect normal.    LABORATORY DATA:  I have reviewed the data as listed Lab Results  Component Value Date   WBC 6.2 12/17/2019   HGB 8.5 (L) 12/17/2019   HCT 26.8 (L) 12/17/2019   MCV 102.7 (H) 12/17/2019   PLT 204 12/17/2019   Recent Labs    03/02/19 1101 08/20/19 1357 10/01/19 1418 11/19/19 1411 12/17/19 1327  NA 138   < > 137 138 137  K 4.2   < > 3.6  3.8 3.9  CL 103   < > 104 102 102  CO2 27   < > '25 25 26  ' GLUCOSE 88   < > 166* 142* 122*  BUN 17   < > '20 20 20  ' CREATININE 0.82   < > 0.83 0.80 0.88  CALCIUM 9.1   < > 9.0 9.1 9.0  GFRNONAA  --    < > >60 >60 >60  GFRAA  --    < > >60 >60 >60  PROT 7.4  --   --   --   --   ALBUMIN 4.2  --   --   --   --   AST 14  --   --   --   --   ALT 12  --   --   --   --   ALKPHOS 44  --   --   --   --   BILITOT 0.7  --   --   --   --    < > = values in this interval not displayed.     No results found.  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 POSITIVE  For SF3B1; R-IPSS- Very Low risk.  However hemoglobin slowly trending down/symptomatic.  Currently on Aranesp every 2 weeks.  # Hemoglobin is 8.5/ normal white count normal platelets; clinically no improvement noted.  Patient currently on 300 mcg [increased from 200 mcg approximately 1 month ago].  Patient concerned that increased dose of Aranesp is causing her symptoms-right hip pain [see below].  Currently on p.o. iron intake Vitron-C. Feb 2021- Iron sat- 58%.   # Right hip pain-unclear etiology clinically less likely from Aranesp however patient is concerned.  Hold Aranesp; check x-rays.  #I also reach out to Dr. Leretha Pol at [facility] previously consulted].  Also encouraged the patient to reach out to [facility].  # DISPOSITION:  # X- -ray today # HOLD  Aranesp today # in 2 weeks; MD; labs- H&H- Aranesp SQ- Dr.B  All questions were answered. The patient knows to call the clinic with any problems, questions or concerns.   Cammie Sickle, MD 12/17/2019 4:47 PM

## 2019-12-18 ENCOUNTER — Telehealth: Payer: Self-pay | Admitting: Internal Medicine

## 2019-12-18 NOTE — Telephone Encounter (Signed)
On 3/23-x-ray hip mild arthritis no obvious reason for patient's ongoing right hip pain.  Again discussed with the patient is clinically unlikely due to Aranesp injections.  Recommend patient follow-up with PCP for further work-up/management-physical therapy.  Also discussed with the patient that I have not heard back from Dr. Leretha Pol at [facility].  Recommend patient also reach out to Dr. Leretha Pol regarding the next plan of care.  FYI- Dr.[name].

## 2019-12-19 ENCOUNTER — Telehealth: Payer: Self-pay | Admitting: Family Medicine

## 2019-12-19 NOTE — Telephone Encounter (Signed)
Please let pt know her oncologist reached out to me re: her hip pain  He does not think it is a side effect of any of her medication and it sounds like she Talent have some arthritis   Please schedule f/u with me (or Dr Lorelei Pont if she prefers for eval)  Her xray is already in epic  Thanks

## 2019-12-19 NOTE — Telephone Encounter (Signed)
Pt notified of Dr. Marliss Coots comments. Pt said her sxs have started getting better. She said she will give it a few more days to see if she will continue to improve, if not pt will call back and get an appt on the books. Pt said the pain was radiating down to her leg also so she said if her oncologist doesn't think it's related to meds she Marconi have had a flare up of sciatica but since it's starting to get better she will give it some more time and if need be she will call back for an appt but for now pt said she's okay and declined scheduling anything now

## 2019-12-19 NOTE — Telephone Encounter (Signed)
Aware, thanks!

## 2019-12-28 ENCOUNTER — Encounter: Payer: Self-pay | Admitting: Internal Medicine

## 2019-12-28 ENCOUNTER — Other Ambulatory Visit: Payer: Self-pay

## 2019-12-31 ENCOUNTER — Encounter: Payer: Self-pay | Admitting: Internal Medicine

## 2019-12-31 ENCOUNTER — Other Ambulatory Visit: Payer: Self-pay

## 2019-12-31 ENCOUNTER — Inpatient Hospital Stay (HOSPITAL_BASED_OUTPATIENT_CLINIC_OR_DEPARTMENT_OTHER): Payer: PPO | Admitting: Internal Medicine

## 2019-12-31 ENCOUNTER — Inpatient Hospital Stay: Payer: PPO

## 2019-12-31 ENCOUNTER — Inpatient Hospital Stay: Payer: PPO | Attending: Internal Medicine

## 2019-12-31 DIAGNOSIS — R5383 Other fatigue: Secondary | ICD-10-CM | POA: Insufficient documentation

## 2019-12-31 DIAGNOSIS — D469 Myelodysplastic syndrome, unspecified: Secondary | ICD-10-CM | POA: Insufficient documentation

## 2019-12-31 DIAGNOSIS — M25551 Pain in right hip: Secondary | ICD-10-CM | POA: Diagnosis not present

## 2019-12-31 DIAGNOSIS — D4620 Refractory anemia with excess of blasts, unspecified: Secondary | ICD-10-CM | POA: Diagnosis not present

## 2019-12-31 DIAGNOSIS — R002 Palpitations: Secondary | ICD-10-CM | POA: Insufficient documentation

## 2019-12-31 DIAGNOSIS — E039 Hypothyroidism, unspecified: Secondary | ICD-10-CM | POA: Diagnosis not present

## 2019-12-31 DIAGNOSIS — R5381 Other malaise: Secondary | ICD-10-CM | POA: Diagnosis not present

## 2019-12-31 DIAGNOSIS — Z79899 Other long term (current) drug therapy: Secondary | ICD-10-CM | POA: Diagnosis not present

## 2019-12-31 LAB — HEMATOCRIT: HCT: 26.1 % — ABNORMAL LOW (ref 36.0–46.0)

## 2019-12-31 LAB — HEMOGLOBIN: Hemoglobin: 8.5 g/dL — ABNORMAL LOW (ref 12.0–15.0)

## 2019-12-31 MED ORDER — DARBEPOETIN ALFA 300 MCG/0.6ML IJ SOSY
300.0000 ug | PREFILLED_SYRINGE | Freq: Once | INTRAMUSCULAR | Status: AC
  Administered 2019-12-31: 16:00:00 300 ug via SUBCUTANEOUS
  Filled 2019-12-31: qty 0.6

## 2019-12-31 NOTE — Progress Notes (Signed)
[place] CONSULT NOTE  Patient Care Team: [name], [name], MD as PCP - General Rogue Bussing, [name], MD as Consulting Physician (Hematology and Oncology)  CHIEF COMPLAINTS/PURPOSE OF CONSULTATION: Anemia   Oncology History Overview Note   # July 2020-myelodysplastic syndrome with ringed sideroblasts-Normal karyotype; no blasts [IPSS R-Very low risk ~median survival 8.3 years]; iron studies K12 folic acid myeloma panel normal; pyridoxine levels/copper/zinc-WNL. Erythropoietin levels-60. 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  # colonoscopy- 2016 [Dr.Bucinni];  DIAGNOSIS: MDS/low-grade with ringed sideroblasts  STAGE: Low       ;  GOALS: Control  CURRENT/MOST RECENT THERAPY : EPO agent    MDS (myelodysplastic syndrome), low grade ([place])  04/23/2019 Initial Diagnosis   MDS (myelodysplastic syndrome), low grade ([facility])      HISTORY OF PRESENTING ILLNESS:  Carla [name] 68 y.o.  female history of low-grade MDS anemia with ring sideroblasts currently on Aranesp is here for follow-up.  Patient is Aranesp was held 2 weeks ago because concerns of worsening hip pain.  X-ray negative for any acute process.  Patient was reached out by PCP office-however as patient's pain had improved no further work-up/interventions was done.  Complains of fatigue.  Complains of shortness of with exertion.  Complains of palpitations.  Denies any syncopal episodes.  Review of Systems  Constitutional: Positive for malaise/fatigue. Negative for chills, diaphoresis and fever.  HENT: Negative for nosebleeds and sore throat.   Eyes: Negative for double vision.  Respiratory: Negative for cough, hemoptysis, sputum production, shortness  of breath and wheezing.   Cardiovascular: Positive for leg swelling. Negative for chest pain and orthopnea.  Gastrointestinal: Negative for abdominal pain, blood in stool, constipation, diarrhea, heartburn, melena and vomiting.  Genitourinary: Negative for dysuria, frequency and urgency.  Musculoskeletal: Positive for back pain and joint pain.  Skin: Negative.  Negative for itching and rash.  Neurological: Negative for dizziness, tingling, focal weakness, weakness and headaches.  Endo/Heme/Allergies: Does not bruise/bleed easily.  Psychiatric/Behavioral: Negative for depression. The patient is not nervous/anxious and does not have insomnia.     MEDICAL HISTORY:  Past Medical History:  Diagnosis Date  . Allergic rhinitis, cause unspecified   . Anemia, unspecified   . Carpal tunnel syndrome   . Cystitis, unspecified   . Family history of osteoporosis   . PONV (postoperative nausea and vomiting)   . Postnasal drip   . Syncope and collapse     SURGICAL HISTORY: Past Surgical History:  Procedure Laterality Date  . CARPAL TUNNEL RELEASE    . COLONOSCOPY  2005  . COLONOSCOPY WITH PROPOFOL N/A 07/31/2015   Procedure: COLONOSCOPY WITH PROPOFOL;  Surgeon: Ronald Lobo, MD;  Location: [facility];  Service: Endoscopy;  Laterality: N/A;  . DIAGNOSTIC LAPAROSCOPY     dx lack of pregnancy   . EYE SURGERY     lasik, cataract, prk,yag procedure    SOCIAL HISTORY: Social History   Socioeconomic History  . Marital status: Married    Spouse name: Not on file  . Number of children: Not on file  . Years of education: Not on file  . Highest education level: Not on file  Occupational History  . Not on file  Tobacco Use  . Smoking status: Never Smoker  . Smokeless tobacco: Never Used  Substance and Sexual Activity  . Alcohol use: Yes  Alcohol/week: 0.0 standard drinks    Comment: Rare  . Drug use: No  . Sexual activity: Not Currently  Other Topics Concern  . Not on file  Social  History Narrative   No regular exercise      Drinks lots of [employer]         Social Determinants of Health   Financial Resource Strain:   . Difficulty of Paying Living Expenses:   Food Insecurity:   . Worried About Charity fundraiser in the Last Year:   . Arboriculturist in the Last Year:   Transportation Needs:   . Film/video editor (Medical):   Marland Kitchen Lack of Transportation (Non-Medical):   Physical Activity:   . Days of Exercise per Week:   . Minutes of Exercise per Session:   Stress:   . Feeling of Stress :   Social Connections:   . Frequency of Communication with Friends and Family:   . Frequency of Social Gatherings with Friends and Family:   . Attends Religious Services:   . Active Member of Clubs or Organizations:   . Attends Archivist Meetings:   Marland Kitchen Marital Status:   Intimate Partner Violence:   . Fear of Current or Ex-Partner:   . Emotionally Abused:   Marland Kitchen Physically Abused:   . Sexually Abused:     FAMILY HISTORY: Family History  Problem Relation Age of Onset  . Osteoporosis Mother   . Stroke Mother   . Coronary artery disease Father   . Stroke Father 9  . Diabetes Other        Aunts and uncles  . Breast cancer Paternal Aunt   . Breast cancer Maternal Aunt   . Arthritis/Rheumatoid Child     ALLERGIES:  is allergic to codeine.  MEDICATIONS:  Current Outpatient Medications  Medication Sig Dispense Refill  . acetaminophen-codeine (TYLENOL #3) 300-30 MG tablet Take 1-2 tablets by mouth every 6 (six) hours as needed for moderate pain.    Marland Kitchen BIOTIN PO Take 1 tablet by mouth daily.    . Calcium Carb-Cholecalciferol (CALCIUM 600+D3 PO) Take by mouth.    . cetirizine (ZYRTEC) 10 MG tablet Take 10 mg by mouth daily as needed for allergies.     . clobetasol cream (TEMOVATE) 0.05 % As directed    . levothyroxine (SYNTHROID) 25 MCG tablet TAKE 1 TABLET BY MOUTH ONCE A DAY. TAKE ON AN EMPTY STOMACH WITH A GLASS OF WATER ATLEAST 30-60 MIN BEFORE BREAKFAST  90 tablet 3  . Sulfacetamide Sodium, Acne, 10 % LOTN As directed    . traMADol (ULTRAM) 50 MG tablet Take 1 tablet (50 mg total) by mouth every 6 (six) hours as needed for moderate pain or severe pain. 30 tablet 0   No current facility-administered medications for this visit.   Facility-Administered Medications Ordered in Other Visits  Medication Dose Route Frequency Provider Last Rate Last Admin  . Darbepoetin Alfa (ARANESP) injection 300 mcg  300 mcg Subcutaneous Once Charlaine Dalton R, MD          PHYSICAL EXAMINATION:   Vitals:   12/31/19 1506  BP: 132/75  Pulse: 73  Temp: (!) 97.4 F (36.3 C)  SpO2: 98%   Filed Weights   12/31/19 1506  Weight: 155 lb (70.3 kg)    Physical Exam  Constitutional: She is oriented to person, place, and time and well-developed, well-nourished, and in no distress.  HENT:  Head: Normocephalic and atraumatic.  Mouth/Throat: Oropharynx is clear  and moist. No oropharyngeal exudate.  Eyes: Pupils are equal, round, and reactive to light.  Cardiovascular: Normal rate and regular rhythm.  Pulmonary/Chest: Effort normal and breath sounds normal. No respiratory distress. She has no wheezes.  Abdominal: Soft. Bowel sounds are normal. She exhibits no distension and no mass. There is no abdominal tenderness. There is no rebound and no guarding.  Musculoskeletal:        General: Edema present. No tenderness. Normal range of motion.     Cervical back: Normal range of motion and neck supple.     Comments: Pain in the right hip on raising the leg.  Neurological: She is alert and oriented to person, place, and time.  Skin: Skin is warm.  Psychiatric: Affect normal.    LABORATORY DATA:  I have reviewed the data as listed Lab Results  Component Value Date   WBC 6.2 12/17/2019   HGB 8.5 (L) 12/31/2019   HCT 26.1 (L) 12/31/2019   MCV 102.7 (H) 12/17/2019   PLT 204 12/17/2019   Recent Labs    03/02/19 1101 08/20/19 1357 10/01/19 1418  11/19/19 1411 12/17/19 1327  NA 138   < > 137 138 137  K 4.2   < > 3.6 3.8 3.9  CL 103   < > 104 102 102  CO2 27   < > '25 25 26  ' GLUCOSE 88   < > 166* 142* 122*  BUN 17   < > '20 20 20  ' CREATININE 0.82   < > 0.83 0.80 0.88  CALCIUM 9.1   < > 9.0 9.1 9.0  GFRNONAA  --    < > >60 >60 >60  GFRAA  --    < > >60 >60 >60  PROT 7.4  --   --   --   --   ALBUMIN 4.2  --   --   --   --   AST 14  --   --   --   --   ALT 12  --   --   --   --   ALKPHOS 44  --   --   --   --   BILITOT 0.7  --   --   --   --    < > = values in this interval not displayed.     DG HIP UNILAT WITH PELVIS 2-3 VIEWS RIGHT  Result Date: 12/17/2019 CLINICAL DATA:  69 year old female with right hip pain. No known injury. EXAM: DG HIP (WITH OR WITHOUT PELVIS) 2-3V RIGHT COMPARISON:  None. FINDINGS: There is no acute fracture or dislocation. Mild osteopenia. There is mild bilateral hip arthritic changes. The soft tissues are unremarkable. IMPRESSION: No acute fracture or dislocation. Electronically Signed   By: Anner Crete M.D.   On: 12/17/2019 20:02    MDS (myelodysplastic syndrome), low grade ([place]) # Low grade MDS- with ringed sideroblasts; no blasts. POSITIVE  For SF3B1; R-IPSS- Very Low risk.  However hemoglobin slowly trending down/symptomatic.  Currently on Aranesp every 2 weeks.  # Hemoglobin is 8.5/ normal white count normal platelets; overall stable.  Continue Aranesp 300 mcg every 2 weeks.  February 2021-iron studies negative for deficiency.   # Right hip pain-x-rays; benign.  Question other causes.  Defer to PCP regarding further work-up.  Currently pain stable.  # DISPOSITION:  #  Aranesp today # in 2 weeks- H&H; aranesp # in 4 weeks; MD; labs- H&H- Aranesp SQ- Dr.B  All questions were answered. The patient  knows to call the clinic with any problems, questions or concerns.   Cammie Sickle, MD 12/31/2019 3:30 PM

## 2019-12-31 NOTE — Assessment & Plan Note (Addendum)
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 POSITIVE  For SF3B1; R-IPSS- Very Low risk.  However hemoglobin slowly trending down/symptomatic.  Currently on Aranesp every 2 weeks.  # Hemoglobin is 8.5/ normal white count normal platelets; overall stable.  Continue Aranesp 300 mcg every 2 weeks.  February 2021-iron studies negative for deficiency.   # Right hip pain-x-rays; benign.  Question other causes.  Defer to PCP regarding further work-up.  Currently pain stable.  # DISPOSITION:  #  Aranesp today # in 2 weeks- H&H; aranesp # in 4 weeks; MD; labs- H&H- Aranesp SQ- Dr.B

## 2020-01-02 ENCOUNTER — Encounter: Admit: 2020-01-02 | Discharge: 2020-01-02 | Payer: MEDICARE | Primary: Family

## 2020-01-02 DIAGNOSIS — I10 Essential (primary) hypertension: Secondary | ICD-10-CM

## 2020-01-02 DIAGNOSIS — C50919 Malignant neoplasm of unspecified site of unspecified female breast: Secondary | ICD-10-CM

## 2020-01-02 DIAGNOSIS — M1990 Unspecified osteoarthritis, unspecified site: Secondary | ICD-10-CM

## 2020-01-02 DIAGNOSIS — I471 Supraventricular tachycardia: Secondary | ICD-10-CM

## 2020-01-02 DIAGNOSIS — E669 Obesity, unspecified: Secondary | ICD-10-CM

## 2020-01-02 DIAGNOSIS — R002 Palpitations: Secondary | ICD-10-CM

## 2020-01-02 DIAGNOSIS — H3530 Unspecified macular degeneration: Secondary | ICD-10-CM

## 2020-01-02 DIAGNOSIS — C439 Malignant melanoma of skin, unspecified: Secondary | ICD-10-CM

## 2020-01-02 DIAGNOSIS — C4362 Malignant melanoma of left upper limb, including shoulder: Secondary | ICD-10-CM

## 2020-01-02 DIAGNOSIS — E559 Vitamin D deficiency, unspecified: Secondary | ICD-10-CM

## 2020-01-07 ENCOUNTER — Encounter: Admit: 2020-01-07 | Discharge: 2020-01-07 | Payer: MEDICARE | Primary: Family

## 2020-01-08 ENCOUNTER — Encounter: Admit: 2020-01-08 | Discharge: 2020-01-08 | Payer: MEDICARE | Primary: Family

## 2020-01-14 ENCOUNTER — Inpatient Hospital Stay: Payer: PPO

## 2020-01-14 ENCOUNTER — Other Ambulatory Visit: Payer: Self-pay

## 2020-01-14 VITALS — BP 106/62 | HR 72

## 2020-01-14 DIAGNOSIS — D4620 Refractory anemia with excess of blasts, unspecified: Secondary | ICD-10-CM

## 2020-01-14 DIAGNOSIS — D469 Myelodysplastic syndrome, unspecified: Secondary | ICD-10-CM | POA: Diagnosis not present

## 2020-01-14 LAB — HEMOGLOBIN: Hemoglobin: 8.7 g/dL — ABNORMAL LOW (ref 12.0–15.0)

## 2020-01-14 LAB — HEMATOCRIT: HCT: 27.8 % — ABNORMAL LOW (ref 36.0–46.0)

## 2020-01-14 MED ORDER — DARBEPOETIN ALFA 300 MCG/0.6ML IJ SOSY
300.0000 ug | PREFILLED_SYRINGE | Freq: Once | INTRAMUSCULAR | Status: AC
  Administered 2020-01-14: 12:00:00 300 ug via SUBCUTANEOUS
  Filled 2020-01-14: qty 0.6

## 2020-01-25 DIAGNOSIS — L718 Other rosacea: Secondary | ICD-10-CM | POA: Diagnosis not present

## 2020-01-28 ENCOUNTER — Inpatient Hospital Stay: Payer: PPO

## 2020-01-28 ENCOUNTER — Inpatient Hospital Stay (HOSPITAL_BASED_OUTPATIENT_CLINIC_OR_DEPARTMENT_OTHER): Payer: PPO | Admitting: Internal Medicine

## 2020-01-28 ENCOUNTER — Inpatient Hospital Stay: Payer: PPO | Attending: Internal Medicine

## 2020-01-28 ENCOUNTER — Other Ambulatory Visit: Payer: Self-pay

## 2020-01-28 DIAGNOSIS — D469 Myelodysplastic syndrome, unspecified: Secondary | ICD-10-CM | POA: Insufficient documentation

## 2020-01-28 DIAGNOSIS — Z79899 Other long term (current) drug therapy: Secondary | ICD-10-CM | POA: Diagnosis not present

## 2020-01-28 DIAGNOSIS — D4620 Refractory anemia with excess of blasts, unspecified: Secondary | ICD-10-CM

## 2020-01-28 DIAGNOSIS — Z8261 Family history of arthritis: Secondary | ICD-10-CM | POA: Diagnosis not present

## 2020-01-28 DIAGNOSIS — Z833 Family history of diabetes mellitus: Secondary | ICD-10-CM | POA: Insufficient documentation

## 2020-01-28 DIAGNOSIS — E039 Hypothyroidism, unspecified: Secondary | ICD-10-CM | POA: Diagnosis not present

## 2020-01-28 DIAGNOSIS — Z803 Family history of malignant neoplasm of breast: Secondary | ICD-10-CM | POA: Diagnosis not present

## 2020-01-28 LAB — HEMATOCRIT: HCT: 26.4 % — ABNORMAL LOW (ref 36.0–46.0)

## 2020-01-28 LAB — HEMOGLOBIN: Hemoglobin: 8.4 g/dL — ABNORMAL LOW (ref 12.0–15.0)

## 2020-01-28 MED ORDER — DARBEPOETIN ALFA 300 MCG/0.6ML IJ SOSY
300.0000 ug | PREFILLED_SYRINGE | Freq: Once | INTRAMUSCULAR | Status: AC
  Administered 2020-01-28: 300 ug via SUBCUTANEOUS
  Filled 2020-01-28: qty 0.6

## 2020-01-28 NOTE — Assessment & Plan Note (Addendum)
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 POSITIVE  For SF3B1; R-IPSS- Very Low risk.  However hemoglobin slowly trending down/symptomatic.  Currently on Aranesp every 2 weeks.  # Hemoglobin is 8.4/ normal white count normal platelets; overall stable.  Continue Aranesp 300 mcg every 2 weeks.  February 2021-iron studies negative for deficiency.  Patient is disappointed with the lack of significant provement of hemoglobin.  Discussed that we will have to increase the dose of Aranesp if no significant improvement noted in the next few weeks.  # DISPOSITION:  #  Aranesp today # in 2 weeks- H&H; aranesp # in 4 weeks; MD; labs- cbc/bmp; iron studies/ferritin;LDH; Aranesp SQ- Dr.B

## 2020-01-28 NOTE — Progress Notes (Signed)
[facility] CONSULT NOTE  Patient Care Team: [name], [name], MD as PCP - General Rogue Bussing, [name], MD as Consulting Physician (Hematology and Oncology)  CHIEF COMPLAINTS/PURPOSE OF CONSULTATION: Anemia   Oncology History Overview Note   # July 2020-myelodysplastic syndrome with ringed sideroblasts-Normal karyotype; no blasts [IPSS R-Very low risk ~median survival 8.3 years]; iron studies G18 folic acid myeloma panel normal; pyridoxine levels/copper/zinc-WNL. Erythropoietin levels-60. 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  # colonoscopy- 2016 [Dr.Bucinni];  DIAGNOSIS: MDS/low-grade with ringed sideroblasts  STAGE: Low       ;  GOALS: Control  CURRENT/MOST RECENT THERAPY : EPO agent    MDS (myelodysplastic syndrome), low grade ([facility])  04/23/2019 Initial Diagnosis   MDS (myelodysplastic syndrome), low grade ([facility])      HISTORY OF PRESENTING ILLNESS:  Carla [name] 69 y.o.  female history of low-grade MDS anemia with ring sideroblasts currently on Aranesp is here for follow-up.  Patient continues to complain of fatigue.  She continues to be feeling sleepy.  Shortness of breath on exertion.  No syncopal episodes or falls. Review of Systems  Constitutional: Positive for malaise/fatigue. Negative for chills, diaphoresis and fever.  HENT: Negative for nosebleeds and sore throat.   Eyes: Negative for double vision.  Respiratory: Negative for cough, hemoptysis, sputum production, shortness of breath and wheezing.   Cardiovascular: Positive for leg swelling. Negative for chest pain and orthopnea.  Gastrointestinal: Negative for abdominal pain, blood in stool, constipation, diarrhea, heartburn, melena and  vomiting.  Genitourinary: Negative for dysuria, frequency and urgency.  Musculoskeletal: Positive for back pain and joint pain.  Skin: Negative.  Negative for itching and rash.  Neurological: Negative for dizziness, tingling, focal weakness, weakness and headaches.  Endo/Heme/Allergies: Does not bruise/bleed easily.  Psychiatric/Behavioral: Negative for depression. The patient is not nervous/anxious and does not have insomnia.     MEDICAL HISTORY:  Past Medical History:  Diagnosis Date  . Allergic rhinitis, cause unspecified   . Anemia, unspecified   . Carpal tunnel syndrome   . Cystitis, unspecified   . Family history of osteoporosis   . PONV (postoperative nausea and vomiting)   . Postnasal drip   . Syncope and collapse     SURGICAL HISTORY: Past Surgical History:  Procedure Laterality Date  . CARPAL TUNNEL RELEASE    . COLONOSCOPY  2005  . COLONOSCOPY WITH PROPOFOL N/A 07/31/2015   Procedure: COLONOSCOPY WITH PROPOFOL;  Surgeon: Ronald Lobo, MD;  Location: [facility];  Service: Endoscopy;  Laterality: N/A;  . DIAGNOSTIC LAPAROSCOPY     dx lack of pregnancy   . EYE SURGERY     lasik, cataract, prk,yag procedure    SOCIAL HISTORY: Social History   Socioeconomic History  . Marital status: Married    Spouse name: Not on file  . Number of children: Not on file  . Years of education: Not on file  . Highest education level: Not on file  Occupational History  . Not on file  Tobacco Use  . Smoking status: Never Smoker  . Smokeless tobacco: Never Used  Substance and Sexual Activity  . Alcohol use: Yes    Alcohol/week: 0.0 standard drinks    Comment: Rare  . Drug use: No  . Sexual activity: Not Currently  Other Topics Concern  . Not on file  Social History Narrative  No regular exercise      Drinks lots of [employer]         Social Determinants of Health   Financial Resource Strain:   . Difficulty of Paying Living Expenses:   Food Insecurity:   . Worried  About Charity fundraiser in the Last Year:   . Arboriculturist in the Last Year:   Transportation Needs:   . Film/video editor (Medical):   Marland Kitchen Lack of Transportation (Non-Medical):   Physical Activity:   . Days of Exercise per Week:   . Minutes of Exercise per Session:   Stress:   . Feeling of Stress :   Social Connections:   . Frequency of Communication with Friends and Family:   . Frequency of Social Gatherings with Friends and Family:   . Attends Religious Services:   . Active Member of Clubs or Organizations:   . Attends Archivist Meetings:   Marland Kitchen Marital Status:   Intimate Partner Violence:   . Fear of Current or Ex-Partner:   . Emotionally Abused:   Marland Kitchen Physically Abused:   . Sexually Abused:     FAMILY HISTORY: Family History  Problem Relation Age of Onset  . Osteoporosis Mother   . Stroke Mother   . Coronary artery disease Father   . Stroke Father 29  . Diabetes Other        Aunts and uncles  . Breast cancer Paternal Aunt   . Breast cancer Maternal Aunt   . Arthritis/Rheumatoid Child     ALLERGIES:  is allergic to codeine.  MEDICATIONS:  Current Outpatient Medications  Medication Sig Dispense Refill  . acetaminophen-codeine (TYLENOL #3) 300-30 MG tablet Take 1-2 tablets by mouth every 6 (six) hours as needed for moderate pain.    Marland Kitchen BIOTIN PO Take 1 tablet by mouth daily.    . Calcium Carb-Cholecalciferol (CALCIUM 600+D3 PO) Take by mouth.    . cetirizine (ZYRTEC) 10 MG tablet Take 10 mg by mouth daily as needed for allergies.     . clobetasol cream (TEMOVATE) 0.05 % As directed    . levothyroxine (SYNTHROID) 25 MCG tablet TAKE 1 TABLET BY MOUTH ONCE A DAY. TAKE ON AN EMPTY STOMACH WITH A GLASS OF WATER ATLEAST 30-60 MIN BEFORE BREAKFAST 90 tablet 3  . Sulfacetamide Sodium, Acne, 10 % LOTN As directed    . traMADol (ULTRAM) 50 MG tablet Take 1 tablet (50 mg total) by mouth every 6 (six) hours as needed for moderate pain or severe pain. 30 tablet 0    No current facility-administered medications for this visit.      PHYSICAL EXAMINATION:   Vitals:   01/28/20 1329  BP: (!) 121/46  Pulse: 72  Temp: (!) 97.2 F (36.2 C)  SpO2: 100%   Filed Weights   01/28/20 1329  Weight: 156 lb (70.8 kg)    Physical Exam  Constitutional: She is oriented to person, place, and time and well-developed, well-nourished, and in no distress.  HENT:  Head: Normocephalic and atraumatic.  Mouth/Throat: Oropharynx is clear and moist. No oropharyngeal exudate.  Eyes: Pupils are equal, round, and reactive to light.  Cardiovascular: Normal rate and regular rhythm.  Pulmonary/Chest: Effort normal and breath sounds normal. No respiratory distress. She has no wheezes.  Abdominal: Soft. Bowel sounds are normal. She exhibits no distension and no mass. There is no abdominal tenderness. There is no rebound and no guarding.  Musculoskeletal:        General:  Edema present. No tenderness. Normal range of motion.     Cervical back: Normal range of motion and neck supple.     Comments: Pain in the right hip on raising the leg.  Neurological: She is alert and oriented to person, place, and time.  Skin: Skin is warm.  Psychiatric: Affect normal.    LABORATORY DATA:  I have reviewed the data as listed Lab Results  Component Value Date   WBC 6.2 12/17/2019   HGB 8.4 (L) 01/28/2020   HCT 26.4 (L) 01/28/2020   MCV 102.7 (H) 12/17/2019   PLT 204 12/17/2019   Recent Labs    03/02/19 1101 08/20/19 1357 10/01/19 1418 11/19/19 1411 12/17/19 1327  NA 138   < > 137 138 137  K 4.2   < > 3.6 3.8 3.9  CL 103   < > 104 102 102  CO2 27   < > '25 25 26  ' GLUCOSE 88   < > 166* 142* 122*  BUN 17   < > '20 20 20  ' CREATININE 0.82   < > 0.83 0.80 0.88  CALCIUM 9.1   < > 9.0 9.1 9.0  GFRNONAA  --    < > >60 >60 >60  GFRAA  --    < > >60 >60 >60  PROT 7.4  --   --   --   --   ALBUMIN 4.2  --   --   --   --   AST 14  --   --   --   --   ALT 12  --   --   --   --    ALKPHOS 44  --   --   --   --   BILITOT 0.7  --   --   --   --    < > = values in this interval not displayed.     No results found.  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 POSITIVE  For SF3B1; R-IPSS- Very Low risk.  However hemoglobin slowly trending down/symptomatic.  Currently on Aranesp every 2 weeks.  # Hemoglobin is 8.4/ normal white count normal platelets; overall stable.  Continue Aranesp 300 mcg every 2 weeks.  February 2021-iron studies negative for deficiency.  Patient is disappointed with the lack of significant provement of hemoglobin.  Discussed that we will have to increase the dose of Aranesp if no significant improvement noted in the next few weeks.  # DISPOSITION:  #  Aranesp today # in 2 weeks- H&H; aranesp # in 4 weeks; MD; labs- cbc/bmp; iron studies/ferritin;LDH; Aranesp SQ- Dr.B  All questions were answered. The patient knows to call the clinic with any problems, questions or concerns.   Cammie Sickle, MD 01/28/2020 10:11 PM

## 2020-02-11 ENCOUNTER — Other Ambulatory Visit: Payer: Self-pay

## 2020-02-11 ENCOUNTER — Inpatient Hospital Stay: Payer: PPO

## 2020-02-11 VITALS — BP 122/69 | HR 67

## 2020-02-11 DIAGNOSIS — D469 Myelodysplastic syndrome, unspecified: Secondary | ICD-10-CM | POA: Diagnosis not present

## 2020-02-11 DIAGNOSIS — D4620 Refractory anemia with excess of blasts, unspecified: Secondary | ICD-10-CM

## 2020-02-11 LAB — HEMOGLOBIN: Hemoglobin: 8.4 g/dL — ABNORMAL LOW (ref 12.0–15.0)

## 2020-02-11 LAB — HEMATOCRIT: HCT: 26.9 % — ABNORMAL LOW (ref 36.0–46.0)

## 2020-02-11 MED ORDER — DARBEPOETIN ALFA 300 MCG/0.6ML IJ SOSY
300.0000 ug | PREFILLED_SYRINGE | Freq: Once | INTRAMUSCULAR | Status: AC
  Administered 2020-02-11: 300 ug via SUBCUTANEOUS
  Filled 2020-02-11: qty 0.6

## 2020-02-26 ENCOUNTER — Other Ambulatory Visit: Payer: Self-pay

## 2020-02-26 ENCOUNTER — Inpatient Hospital Stay (HOSPITAL_BASED_OUTPATIENT_CLINIC_OR_DEPARTMENT_OTHER): Payer: PPO | Admitting: Internal Medicine

## 2020-02-26 ENCOUNTER — Encounter: Payer: Self-pay | Admitting: Internal Medicine

## 2020-02-26 ENCOUNTER — Inpatient Hospital Stay: Payer: PPO | Attending: Internal Medicine

## 2020-02-26 ENCOUNTER — Inpatient Hospital Stay: Payer: PPO

## 2020-02-26 DIAGNOSIS — R5381 Other malaise: Secondary | ICD-10-CM | POA: Diagnosis not present

## 2020-02-26 DIAGNOSIS — Z79899 Other long term (current) drug therapy: Secondary | ICD-10-CM | POA: Insufficient documentation

## 2020-02-26 DIAGNOSIS — D4620 Refractory anemia with excess of blasts, unspecified: Secondary | ICD-10-CM

## 2020-02-26 DIAGNOSIS — E039 Hypothyroidism, unspecified: Secondary | ICD-10-CM | POA: Insufficient documentation

## 2020-02-26 DIAGNOSIS — R0602 Shortness of breath: Secondary | ICD-10-CM | POA: Insufficient documentation

## 2020-02-26 DIAGNOSIS — R5383 Other fatigue: Secondary | ICD-10-CM | POA: Diagnosis not present

## 2020-02-26 LAB — HEMATOCRIT: HCT: 25.6 % — ABNORMAL LOW (ref 36.0–46.0)

## 2020-02-26 LAB — HEMOGLOBIN: Hemoglobin: 8.2 g/dL — ABNORMAL LOW (ref 12.0–15.0)

## 2020-02-26 MED ORDER — DARBEPOETIN ALFA 300 MCG/0.6ML IJ SOSY
300.0000 ug | PREFILLED_SYRINGE | Freq: Once | INTRAMUSCULAR | Status: AC
  Administered 2020-02-26: 300 ug via SUBCUTANEOUS
  Filled 2020-02-26: qty 0.6

## 2020-02-26 MED ORDER — DARBEPOETIN ALFA 200 MCG/0.4ML IJ SOSY
200.0000 ug | PREFILLED_SYRINGE | Freq: Once | INTRAMUSCULAR | Status: AC
  Administered 2020-02-26: 200 ug via SUBCUTANEOUS
  Filled 2020-02-26: qty 0.4

## 2020-02-26 NOTE — Assessment & Plan Note (Addendum)
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 POSITIVE  For SF3B1; R-IPSS- Very Low risk.  However hemoglobin slowly trending down/symptomatic.  Currently on Aranesp every 2 weeks.  # Hemoglobin is  8.2 / normal white count normal platelets; overall stable.  Continue Aranesp 300 mcg every 2 weeks.  February 2021-iron studies negative for deficiency. Given lack of significant provement of hemoglobin- increase the dose to 500 mcg q 2 weeks.  # DISPOSITION:  #  Aranesp today  # in 2 weeks- H&H;Iron studies/ferritin;LDH; aranesp  # in 4 weeks; MD; labs- cbc/bmp;possible; Aranesp SQ- Dr.B

## 2020-02-26 NOTE — Progress Notes (Signed)
[facility] CONSULT NOTE  Patient Care Team: [name], [name], MD as PCP - General Rogue Bussing, [name], MD as Consulting Physician (Hematology and Oncology)  CHIEF COMPLAINTS/PURPOSE OF CONSULTATION: Anemia   Oncology History Overview Note   # July 2020-myelodysplastic syndrome with ringed sideroblasts-Normal karyotype; no blasts [IPSS R-Very low risk ~median survival 8.3 years]; iron studies Z61 folic acid myeloma panel normal; pyridoxine levels/copper/zinc-WNL. Erythropoietin levels-60. 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  # colonoscopy- 2016 [Dr.Bucinni];  DIAGNOSIS: MDS/low-grade with ringed sideroblasts  STAGE: Low       ;  GOALS: Control  CURRENT/MOST RECENT THERAPY : EPO agent    MDS (myelodysplastic syndrome), low grade ([facility])  04/23/2019 Initial Diagnosis   MDS (myelodysplastic syndrome), low grade ([facility])      HISTORY OF PRESENTING ILLNESS:  Carla [name] 68 y.o.  female history of low-grade MDS anemia with ring sideroblasts currently on Aranesp is here for follow-up.  Patient continues to complain of fatigue.  She feels sleepy.  Complains of shortness of breath on exertion.  No syncopal episodes or falls. Review of Systems  Constitutional: Positive for malaise/fatigue. Negative for chills, diaphoresis and fever.  HENT: Negative for nosebleeds and sore throat.   Eyes: Negative for double vision.  Respiratory: Negative for cough, hemoptysis, sputum production, shortness of breath and wheezing.   Cardiovascular: Positive for leg swelling. Negative for chest pain and orthopnea.  Gastrointestinal: Negative for abdominal pain, blood in stool, constipation, diarrhea, heartburn, melena and vomiting.   Genitourinary: Negative for dysuria, frequency and urgency.  Musculoskeletal: Positive for back pain and joint pain.  Skin: Negative.  Negative for itching and rash.  Neurological: Negative for dizziness, tingling, focal weakness, weakness and headaches.  Endo/Heme/Allergies: Does not bruise/bleed easily.  Psychiatric/Behavioral: Negative for depression. The patient is not nervous/anxious and does not have insomnia.     MEDICAL HISTORY:  Past Medical History:  Diagnosis Date  . Allergic rhinitis, cause unspecified   . Anemia, unspecified   . Carpal tunnel syndrome   . Cystitis, unspecified   . Family history of osteoporosis   . PONV (postoperative nausea and vomiting)   . Postnasal drip   . Syncope and collapse     SURGICAL HISTORY: Past Surgical History:  Procedure Laterality Date  . CARPAL TUNNEL RELEASE    . COLONOSCOPY  2005  . COLONOSCOPY WITH PROPOFOL N/A 07/31/2015   Procedure: COLONOSCOPY WITH PROPOFOL;  Surgeon: Ronald Lobo, MD;  Location: [facility];  Service: Endoscopy;  Laterality: N/A;  . DIAGNOSTIC LAPAROSCOPY     dx lack of pregnancy   . EYE SURGERY     lasik, cataract, prk,yag procedure    SOCIAL HISTORY: Social History   Socioeconomic History  . Marital status: Married    Spouse name: Not on file  . Number of children: Not on file  . Years of education: Not on file  . Highest education level: Not on file  Occupational History  . Not on file  Tobacco Use  . Smoking status: Never Smoker  . Smokeless tobacco: Never Used  Substance and Sexual Activity  . Alcohol use: Yes    Alcohol/week: 0.0 standard drinks    Comment: Rare  . Drug use: No  . Sexual activity: Not Currently  Other Topics Concern  . Not on file  Social History Narrative  No regular exercise      Drinks lots of [employer]         Social Determinants of Health   Financial Resource Strain:   . Difficulty of Paying Living Expenses:   Food Insecurity:   . Worried About  Charity fundraiser in the Last Year:   . Arboriculturist in the Last Year:   Transportation Needs:   . Film/video editor (Medical):   Marland Kitchen Lack of Transportation (Non-Medical):   Physical Activity:   . Days of Exercise per Week:   . Minutes of Exercise per Session:   Stress:   . Feeling of Stress :   Social Connections:   . Frequency of Communication with Friends and Family:   . Frequency of Social Gatherings with Friends and Family:   . Attends Religious Services:   . Active Member of Clubs or Organizations:   . Attends Archivist Meetings:   Marland Kitchen Marital Status:   Intimate Partner Violence:   . Fear of Current or Ex-Partner:   . Emotionally Abused:   Marland Kitchen Physically Abused:   . Sexually Abused:     FAMILY HISTORY: Family History  Problem Relation Age of Onset  . Osteoporosis Mother   . Stroke Mother   . Coronary artery disease Father   . Stroke Father 81  . Diabetes Other        Aunts and uncles  . Breast cancer Paternal Aunt   . Breast cancer Maternal Aunt   . Arthritis/Rheumatoid Child     ALLERGIES:  is allergic to codeine.  MEDICATIONS:  Current Outpatient Medications  Medication Sig Dispense Refill  . BIOTIN PO Take 1 tablet by mouth daily.    . Calcium Carb-Cholecalciferol (CALCIUM 600+D3 PO) Take by mouth.    . cetirizine (ZYRTEC) 10 MG tablet Take 10 mg by mouth daily as needed for allergies.     . clobetasol cream (TEMOVATE) 0.05 % As directed    . levothyroxine (SYNTHROID) 25 MCG tablet TAKE 1 TABLET BY MOUTH ONCE A DAY. TAKE ON AN EMPTY STOMACH WITH A GLASS OF WATER ATLEAST 30-60 MIN BEFORE BREAKFAST 90 tablet 3  . Sulfacetamide Sodium, Acne, 10 % LOTN As directed     No current facility-administered medications for this visit.      PHYSICAL EXAMINATION:   Vitals:   02/26/20 1314  BP: 117/63  Pulse: 70  Resp: 20  Temp: 98.1 F (36.7 C)   Filed Weights   02/26/20 1314  Weight: 154 lb (69.9 kg)    Physical Exam  Constitutional:  She is oriented to person, place, and time and well-developed, well-nourished, and in no distress.  HENT:  Head: Normocephalic and atraumatic.  Mouth/Throat: Oropharynx is clear and moist. No oropharyngeal exudate.  Eyes: Pupils are equal, round, and reactive to light.  Cardiovascular: Normal rate and regular rhythm.  Pulmonary/Chest: Effort normal and breath sounds normal. No respiratory distress. She has no wheezes.  Abdominal: Soft. Bowel sounds are normal. She exhibits no distension and no mass. There is no abdominal tenderness. There is no rebound and no guarding.  Musculoskeletal:        General: Edema present. No tenderness. Normal range of motion.     Cervical back: Normal range of motion and neck supple.     Comments: Pain in the right hip on raising the leg.  Neurological: She is alert and oriented to person, place, and time.  Skin: Skin is warm.  Psychiatric: Affect normal.  LABORATORY DATA:  I have reviewed the data as listed Lab Results  Component Value Date   WBC 6.2 12/17/2019   HGB 8.2 (L) 02/26/2020   HCT 25.6 (L) 02/26/2020   MCV 102.7 (H) 12/17/2019   PLT 204 12/17/2019   Recent Labs    03/02/19 1101 08/20/19 1357 10/01/19 1418 11/19/19 1411 12/17/19 1327  NA 138   < > 137 138 137  K 4.2   < > 3.6 3.8 3.9  CL 103   < > 104 102 102  CO2 27   < > '25 25 26  ' GLUCOSE 88   < > 166* 142* 122*  BUN 17   < > '20 20 20  ' CREATININE 0.82   < > 0.83 0.80 0.88  CALCIUM 9.1   < > 9.0 9.1 9.0  GFRNONAA  --    < > >60 >60 >60  GFRAA  --    < > >60 >60 >60  PROT 7.4  --   --   --   --   ALBUMIN 4.2  --   --   --   --   AST 14  --   --   --   --   ALT 12  --   --   --   --   ALKPHOS 44  --   --   --   --   BILITOT 0.7  --   --   --   --    < > = values in this interval not displayed.     No results found.  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 POSITIVE  For SF3B1; R-IPSS- Very Low risk.  However  hemoglobin slowly trending down/symptomatic.  Currently on Aranesp every 2 weeks.  # Hemoglobin is  8.2 / normal white count normal platelets; overall stable.  Continue Aranesp 300 mcg every 2 weeks.  February 2021-iron studies negative for deficiency. Given lack of significant provement of hemoglobin- increase the dose to 500 mcg q 2 weeks.  # DISPOSITION:  #  Aranesp today  # in 2 weeks- H&H;Iron studies/ferritin;LDH; aranesp  # in 4 weeks; MD; labs- cbc/bmp;possible; Aranesp SQ- Dr.B  All questions were answered. The patient knows to call the clinic with any problems, questions or concerns.   Cammie Sickle, MD 02/26/2020 2:30 PM

## 2020-03-04 ENCOUNTER — Other Ambulatory Visit: Payer: Self-pay | Admitting: Family Medicine

## 2020-03-04 ENCOUNTER — Telehealth: Payer: Self-pay | Admitting: Family Medicine

## 2020-03-04 DIAGNOSIS — E039 Hypothyroidism, unspecified: Secondary | ICD-10-CM

## 2020-03-04 DIAGNOSIS — D649 Anemia, unspecified: Secondary | ICD-10-CM

## 2020-03-04 DIAGNOSIS — R739 Hyperglycemia, unspecified: Secondary | ICD-10-CM

## 2020-03-04 DIAGNOSIS — Z0000 Encounter for general adult medical examination without abnormal findings: Secondary | ICD-10-CM

## 2020-03-04 NOTE — Telephone Encounter (Signed)
-----   Message from Ellamae Sia sent at 02/20/2020 11:46 AM EDT ----- Regarding: Lab orders for Wednesday, 6.9.21  AWV lab orders, please.

## 2020-03-05 ENCOUNTER — Other Ambulatory Visit: Payer: Self-pay

## 2020-03-05 ENCOUNTER — Other Ambulatory Visit (INDEPENDENT_AMBULATORY_CARE_PROVIDER_SITE_OTHER): Payer: PPO

## 2020-03-05 DIAGNOSIS — R739 Hyperglycemia, unspecified: Secondary | ICD-10-CM

## 2020-03-05 DIAGNOSIS — E039 Hypothyroidism, unspecified: Secondary | ICD-10-CM

## 2020-03-05 DIAGNOSIS — D649 Anemia, unspecified: Secondary | ICD-10-CM | POA: Diagnosis not present

## 2020-03-05 DIAGNOSIS — Z0000 Encounter for general adult medical examination without abnormal findings: Secondary | ICD-10-CM

## 2020-03-05 LAB — LIPID PANEL
Cholesterol: 135 mg/dL (ref 0–200)
HDL: 53.8 mg/dL (ref >39.00)
LDL Cholesterol: 70 mg/dL (ref 0–99)
NonHDL: 80.84
Total CHOL/HDL Ratio: 3
Triglycerides: 54 mg/dL (ref 0.0–149.0)
VLDL: 10.8 mg/dL (ref 0.0–40.0)

## 2020-03-05 LAB — CBC WITH DIFFERENTIAL/PLATELET
Basophils Absolute: 0.2 10*3/uL — ABNORMAL HIGH (ref 0.0–0.1)
Basophils Relative: 1.4 % (ref 0.0–3.0)
Eosinophils Absolute: 0.1 10*3/uL (ref 0.0–0.7)
Eosinophils Relative: 0.6 % (ref 0.0–5.0)
HCT: 25 % — ABNORMAL LOW (ref 36.0–46.0)
Hemoglobin: 8.1 g/dL — ABNORMAL LOW (ref 12.0–15.0)
Lymphocytes Relative: 20.5 % (ref 12.0–46.0)
Lymphs Abs: 2.3 10*3/uL (ref 0.7–4.0)
MCHC: 32.2 g/dL (ref 30.0–36.0)
MCV: 100.8 fl — ABNORMAL HIGH (ref 78.0–100.0)
Monocytes Absolute: 1.5 10*3/uL — ABNORMAL HIGH (ref 0.1–1.0)
Monocytes Relative: 13.8 % — ABNORMAL HIGH (ref 3.0–12.0)
Neutro Abs: 7.1 10*3/uL (ref 1.4–7.7)
Neutrophils Relative %: 63.7 % (ref 43.0–77.0)
Platelets: 237 10*3/uL (ref 150.0–400.0)
RBC: 2.47 Mil/uL — ABNORMAL LOW (ref 3.87–5.11)
RDW: 33.4 % — ABNORMAL HIGH (ref 11.5–15.5)
WBC: 11.2 10*3/uL — ABNORMAL HIGH (ref 4.0–10.5)

## 2020-03-05 LAB — COMPREHENSIVE METABOLIC PANEL
ALT: 12 U/L (ref 0–35)
AST: 15 U/L (ref 0–37)
Albumin: 4.5 g/dL (ref 3.5–5.2)
Alkaline Phosphatase: 44 U/L (ref 39–117)
BUN: 15 mg/dL (ref 6–23)
CO2: 27 mEq/L (ref 19–32)
Calcium: 9.5 mg/dL (ref 8.4–10.5)
Chloride: 104 mEq/L (ref 96–112)
Creatinine, Ser: 0.84 mg/dL (ref 0.40–1.20)
GFR: 67.07 mL/min (ref >60.00)
Glucose, Bld: 97 mg/dL (ref 70–99)
Potassium: 4.1 mEq/L (ref 3.5–5.1)
Sodium: 137 mEq/L (ref 135–145)
Total Bilirubin: 0.7 mg/dL (ref 0.2–1.2)
Total Protein: 7.9 g/dL (ref 6.0–8.3)

## 2020-03-05 LAB — HEMOGLOBIN A1C: Hgb A1c MFr Bld: 5.9 % (ref 4.6–6.5)

## 2020-03-05 LAB — TSH: TSH: 2.59 u[IU]/mL (ref 0.35–4.50)

## 2020-03-06 ENCOUNTER — Ambulatory Visit (INDEPENDENT_AMBULATORY_CARE_PROVIDER_SITE_OTHER): Payer: PPO

## 2020-03-06 DIAGNOSIS — Z0000 Encounter for general adult medical examination without abnormal findings: Secondary | ICD-10-CM

## 2020-03-06 NOTE — Progress Notes (Signed)
Subjective:   Carla [name] is a 69 y.o. female who presents for Medicare Annual (Subsequent) preventive examination.  Review of Systems: N/A   I connected with the patient today by telephone and verified that I am speaking with the correct person using two identifiers. Location patient: home Location nurse: work Persons participating in the virtual visit: patient, Marine scientist.   I discussed the limitations, risks, security and privacy concerns of performing an evaluation and management service by telephone and the availability of in person appointments. I also discussed with the patient that there Sorlie be a patient responsible charge related to this service. The patient expressed understanding and verbally consented to this telephonic visit.    Interactive audio and video telecommunications were attempted between this nurse and patient, however failed, due to patient having technical difficulties OR patient did not have access to video capability.  We continued and completed visit with audio only.     Cardiac Risk Factors include: advanced age (>46men, >22 women)     Objective:     Vitals: There were no vitals taken for this visit.  There is no height or weight on file to calculate BMI.  Advanced Directives 03/06/2020 02/26/2020 12/28/2019 12/17/2019 08/17/2019 07/06/2019 05/21/2019  Does Patient Have a Medical Advance Directive? Yes Yes Yes Yes Yes No;Yes No  Type of Paramedic of [place];Living will Healthcare Power of [place];Living will Andrew;Living will Living will;Healthcare Power of [place];Living will -  Does patient want to make changes to medical advance directive? - No - Patient declined No - Patient declined No - Patient declined No - Patient declined No - Patient declined No - Patient declined  Copy of Schle[place] in Chart? No - copy requested No - copy requested  No - copy requested No - copy requested No - copy requested No - copy requested -  Would patient like information on creating a medical advance directive? - - - - - No - Patient declined -    Tobacco Social History   Tobacco Use  Smoking Status Never Smoker  Smokeless Tobacco Never Used     Counseling given: Not Answered   Clinical Intake:  Pre-visit preparation completed: Yes  Pain : No/denies pain     Nutritional Risks: None Diabetes: No  How often do you need to have someone help you when you read instructions, pamphlets, or other written materials from your doctor or pharmacy?: 1 - Never What is the last grade level you completed in school?: 12th  Interpreter Needed?: No  Information entered by :: CJohnson, LPN  Past Medical History:  Diagnosis Date  . Allergic rhinitis, cause unspecified   . Anemia, unspecified   . Carpal tunnel syndrome   . Cystitis, unspecified   . Family history of osteoporosis   . PONV (postoperative nausea and vomiting)   . Postnasal drip   . Syncope and collapse    Past Surgical History:  Procedure Laterality Date  . CARPAL TUNNEL RELEASE    . COLONOSCOPY  2005  . COLONOSCOPY WITH PROPOFOL N/A 07/31/2015   Procedure: COLONOSCOPY WITH PROPOFOL;  Surgeon: Ronald Lobo, MD;  Location: [facility];  Service: Endoscopy;  Laterality: N/A;  . DIAGNOSTIC LAPAROSCOPY     dx lack of pregnancy   . EYE SURGERY     lasik, cataract, prk,yag procedure   Family History  Problem Relation Age of Onset  . Osteoporosis Mother   .  Stroke Mother   . Coronary artery disease Father   . Stroke Father 26  . Diabetes Other        Aunts and uncles  . Breast cancer Paternal Aunt   . Breast cancer Maternal Aunt   . Arthritis/Rheumatoid Child    Social History   Socioeconomic History  . Marital status: Married    Spouse name: Not on file  . Number of children: Not on file  . Years of education: Not on file  . Highest education level: Not on file   Occupational History  . Not on file  Tobacco Use  . Smoking status: Never Smoker  . Smokeless tobacco: Never Used  Vaping Use  . Vaping Use: Never used  Substance and Sexual Activity  . Alcohol use: Yes    Alcohol/week: 0.0 standard drinks    Comment: Rare  . Drug use: No  . Sexual activity: Not Currently  Other Topics Concern  . Not on file  Social History Narrative   No regular exercise      Drinks lots of Pepsi         Social Determinants of Health   Financial Resource Strain: Low Risk   . Difficulty of Paying Living Expenses: Not hard at all  Food Insecurity: No Food Insecurity  . Worried About Charity fundraiser in the Last Year: Never true  . Ran Out of Food in the Last Year: Never true  Transportation Needs: No Transportation Needs  . Lack of Transportation (Medical): No  . Lack of Transportation (Non-Medical): No  Physical Activity: Inactive  . Days of Exercise per Week: 0 days  . Minutes of Exercise per Session: 0 min  Stress: No Stress Concern Present  . Feeling of Stress : Not at all  Social Connections:   . Frequency of Communication with Friends and Family:   . Frequency of Social Gatherings with Friends and Family:   . Attends Religious Services:   . Active Member of Clubs or Organizations:   . Attends Archivist Meetings:   Marland Kitchen Marital Status:     Outpatient Encounter Medications as of 03/06/2020  Medication Sig  . BIOTIN PO Take 1 tablet by mouth daily.  . Calcium Carb-Cholecalciferol (CALCIUM 600+D3 PO) Take by mouth.  . cetirizine (ZYRTEC) 10 MG tablet Take 10 mg by mouth daily as needed for allergies.   . clobetasol cream (TEMOVATE) 0.05 % As directed  . Darbepoetin Alfa (ARANESP) 500 MCG/ML SOSY injection Inject 500 mcg into the skin. Every other week  . levothyroxine (SYNTHROID) 25 MCG tablet TAKE 1 TABLET BY MOUTH ONCE A DAY. TAKE ON AN EMPTY STOMACH WITH A GLASS OF WATER ATLEAST 30-60 MIN BEFORE BREAKFAST  . Sulfacetamide Sodium,  Acne, 10 % LOTN As directed   No facility-administered encounter medications on file as of 03/06/2020.    Activities of Daily Living In your present state of health, do you have any difficulty performing the following activities: 03/06/2020 04/16/2019  Hearing? N N  Vision? N N  Difficulty concentrating or making decisions? N N  Walking or climbing stairs? N N  Dressing or bathing? N N  Doing errands, shopping? N -  Preparing Food and eating ? N -  Using the Toilet? N -  In the past six months, have you accidently leaked urine? N -  Do you have problems with loss of bowel control? N -  Managing your Medications? N -  Managing your Finances? N -  Housekeeping  or managing your Housekeeping? N -  Some recent data might be hidden    Patient Care Team: [name], [name], MD as PCP - General Cammie Sickle, MD as Consulting Physician (Hematology and Oncology)    Assessment:   This is a routine wellness examination for [place].  Exercise Activities and Dietary recommendations Current Exercise Habits: The patient does not participate in regular exercise at present, Exercise limited by: None identified  Goals    . Increase physical activity     When schedule permits, I will continue to exercise for at least 60 minutes 3 days per week.     . Patient Stated     03/06/2020, I will maintain and continue medications as prescribed.        Fall Risk Fall Risk  03/06/2020 03/02/2019 12/20/2017 12/14/2016 10/10/2015  Falls in the past year? 0 0 No No No  Number falls in past yr: 0 - - - -  Injury with Fall? 0 - - - -  Risk for fall due to : No Fall Risks - - - -  Follow up Falls evaluation completed;Falls prevention discussed - - - -   Is the patient's home free of loose throw rugs in walkways, pet beds, electrical cords, etc?   yes      Grab bars in the bathroom? no      Handrails on the stairs?   yes      Adequate lighting?   yes  Timed Get Up and Go performed: N/A  Depression  Screen PHQ 2/9 Scores 03/06/2020 03/02/2019 12/20/2017 12/14/2016  PHQ - 2 Score 0 0 0 0  PHQ- 9 Score 0 0 0 -     Cognitive Function MMSE - Mini Mental State Exam 03/06/2020 03/02/2019 12/20/2017  Orientation to time 5 5 5   Orientation to Place 5 5 5   Registration 3 3 3   Attention/ Calculation 5 0 0  Recall 3 3 3   Language- name 2 objects - 0 0  Language- repeat 1 1 1   Language- follow 3 step command - 0 3  Language- read & follow direction - 0 0  Write a sentence - 0 0  Copy design - 0 0  Total score - 17 20  Mini Cog  Mini-Cog screen was completed. Maximum score is 22. A value of 0 denotes this part of the MMSE was not completed or the patient failed this part of the Mini-Cog screening.       Immunization History  Administered Date(s) Administered  . Fluad Quad(high Dose 65+) 07/05/2019  . Influenza Split 06/22/2011  . Influenza Whole 06/26/2008, 07/15/2009, 08/17/2012  . Influenza, High Dose Seasonal PF 07/13/2015, 06/27/2017  . Influenza,inj,Quad PF,6+ Mos 07/18/2013  . Influenza-Unspecified 07/11/2014, 07/09/2016  . PFIZER SARS-COV-2 Vaccination 11/03/2019, 11/24/2019  . Pneumococcal Conjugate-13 10/10/2015  . Pneumococcal Polysaccharide-23 12/14/2016  . Tdap 06/22/2011  . Zoster 07/05/2012  . Zoster Recombinat (Shingrix) 07/31/2019, 12/19/2019    Qualifies for Shingles Vaccine: yes  Screening Tests Health Maintenance  Topic Date Due  . MAMMOGRAM  03/22/2020  . INFLUENZA VACCINE  04/27/2020  . TETANUS/TDAP  06/21/2021  . COLONOSCOPY  07/30/2025  . DEXA SCAN  Completed  . COVID-19 Vaccine  Completed  . Hepatitis C Screening  Completed  . PNA vac Low Risk Adult  Completed    Cancer Screenings: Lung: Low Dose CT Chest recommended if Age 60-80 years, 30 pack-year currently smoking OR have quit w/in 15 years. Patient does not qualify. Breast:  Up to date on Mammogram: Yes, completed 03/23/2019   Bone Density/Dexa: completed 01/31/2018 Colorectal: completed  07/31/2015  Additional Screenings:  Hepatitis C Screening: 12/14/2016     Plan:   Patient will maintain and continue medications as prescribed.    I have personally reviewed and noted the following in the patient's chart:   . Medical and social history . Use of alcohol, tobacco or illicit drugs  . Current medications and supplements . Functional ability and status . Nutritional status . Physical activity . Advanced directives . List of other physicians . Hospitalizations, surgeries, and ER visits in previous 12 months . Vitals . Screenings to include cognitive, depression, and falls . Referrals and appointments  In addition, I have reviewed and discussed with patient certain preventive protocols, quality metrics, and best practice recommendations. A written personalized care plan for preventive services as well as general preventive health recommendations were provided to patient.     Andrez Grime, LPN  03/30/8888

## 2020-03-06 NOTE — Progress Notes (Signed)
PCP notes:  Health Maintenance: No gaps noted   Abnormal Screenings: none   Patient concerns: none   Nurse concerns: none   Next PCP appt.: 03/10/2020 @ 11:30 am

## 2020-03-06 NOTE — Patient Instructions (Signed)
Carla [name] , Thank you for taking time to come for your Medicare Wellness Visit. I appreciate your ongoing commitment to your health goals. Please review the following plan we discussed and let me know if I can assist you in the future.   Screening recommendations/referrals: Colonoscopy: Up to date, completed 07/31/2015 Mammogram: Up to date, completed 03/23/2019 Bone Density: completed 01/31/2018 Recommended yearly ophthalmology/optometry visit for glaucoma screening and checkup Recommended yearly dental visit for hygiene and checkup  Vaccinations: Influenza vaccine: Up to date, completed 07/05/2019 Pneumococcal vaccine: Completed series Tdap vaccine: Up to date, completed 06/22/2011 Shingles vaccine: Completed series    Advanced directives: Please bring a copy of your POA (Power of [place]) and/or Living Will to your next appointment.   Conditions/risks identified: none  Next appointment: 03/10/2020 @ 11:30 am    Preventive Care 65 Years and Older, Female Preventive care refers to lifestyle choices and visits with your health care provider that can promote health and wellness. What does preventive care include?  A yearly physical exam. This is also called an annual well check.  Dental exams once or twice a year.  Routine eye exams. Ask your health care provider how often you should have your eyes checked.  Personal lifestyle choices, including:  Daily care of your teeth and gums.  Regular physical activity.  Eating a healthy diet.  Avoiding tobacco and drug use.  Limiting alcohol use.  Practicing safe sex.  Taking low-dose aspirin every day.  Taking vitamin and mineral supplements as recommended by your health care provider. What happens during an annual well check? The services and screenings done by your health care provider during your annual well check will depend on your age, overall health, lifestyle risk factors, and family history of disease. Counseling  Your  health care provider Stefanski ask you questions about your:  Alcohol use.  Tobacco use.  Drug use.  Emotional well-being.  Home and relationship well-being.  Sexual activity.  Eating habits.  History of falls.  Memory and ability to understand (cognition).  Work and work Statistician.  Reproductive health. Screening  You Renn have the following tests or measurements:  Height, weight, and BMI.  Blood pressure.  Lipid and cholesterol levels. These Gamel be checked every 5 years, or more frequently if you are over 49 years old.  Skin check.  Lung cancer screening. You Kleppe have this screening every year starting at age 67 if you have a 30-pack-year history of smoking and currently smoke or have quit within the past 15 years.  Fecal occult blood test (FOBT) of the stool. You Forrester have this test every year starting at age 64.  Flexible sigmoidoscopy or colonoscopy. You Mclamb have a sigmoidoscopy every 5 years or a colonoscopy every 10 years starting at age 18.  Hepatitis C blood test.  Hepatitis B blood test.  Sexually transmitted disease (STD) testing.  Diabetes screening. This is done by checking your blood sugar (glucose) after you have not eaten for a while (fasting). You Hyland have this done every 1-3 years.  Bone density scan. This is done to screen for osteoporosis. You Cherubin have this done starting at age 50.  Mammogram. This Odwyer be done every 1-2 years. Talk to your health care provider about how often you should have regular mammograms. Talk with your health care provider about your test results, treatment options, and if necessary, the need for more tests. Vaccines  Your health care provider Bucher recommend certain vaccines, such as:  Influenza vaccine.  This is recommended every year.  Tetanus, diphtheria, and acellular pertussis (Tdap, Td) vaccine. You Slagel need a Td booster every 10 years.  Zoster vaccine. You Guirguis need this after age 58.  Pneumococcal 13-valent  conjugate (PCV13) vaccine. One dose is recommended after age 4.  Pneumococcal polysaccharide (PPSV23) vaccine. One dose is recommended after age 62. Talk to your health care provider about which screenings and vaccines you need and how often you need them. This information is not intended to replace advice given to you by your health care provider. Make sure you discuss any questions you have with your health care provider. Document Released: 10/10/2015 Document Revised: 06/02/2016 Document Reviewed: 07/15/2015 Elsevier Interactive Patient Education  2017 [place] Prevention in the Home Falls can cause injuries. They can happen to people of all ages. There are many things you can do to make your home safe and to help prevent falls. What can I do on the outside of my home?  Regularly fix the edges of walkways and driveways and fix any cracks.  Remove anything that might make you trip as you walk through a door, such as a raised step or threshold.  Trim any bushes or trees on the path to your home.  Use bright outdoor lighting.  Clear any walking paths of anything that might make someone trip, such as rocks or tools.  Regularly check to see if handrails are loose or broken. Make sure that both sides of any steps have handrails.  Any raised decks and porches should have guardrails on the edges.  Have any leaves, snow, or ice cleared regularly.  Use sand or salt on walking paths during winter.  Clean up any spills in your garage right away. This includes oil or grease spills. What can I do in the bathroom?  Use night lights.  Install grab bars by the toilet and in the tub and shower. Do not use towel bars as grab bars.  Use non-skid mats or decals in the tub or shower.  If you need to sit down in the shower, use a plastic, non-slip stool.  Keep the floor dry. Clean up any water that spills on the floor as soon as it happens.  Remove soap buildup in the tub or  shower regularly.  Attach bath mats securely with double-sided non-slip rug tape.  Do not have throw rugs and other things on the floor that can make you trip. What can I do in the bedroom?  Use night lights.  Make sure that you have a light by your bed that is easy to reach.  Do not use any sheets or blankets that are too big for your bed. They should not hang down onto the floor.  Have a firm chair that has side arms. You can use this for support while you get dressed.  Do not have throw rugs and other things on the floor that can make you trip. What can I do in the kitchen?  Clean up any spills right away.  Avoid walking on wet floors.  Keep items that you use a lot in easy-to-reach places.  If you need to reach something above you, use a strong step stool that has a grab bar.  Keep electrical cords out of the way.  Do not use floor polish or wax that makes floors slippery. If you must use wax, use non-skid floor wax.  Do not have throw rugs and other things on the floor that can make  you trip. What can I do with my stairs?  Do not leave any items on the stairs.  Make sure that there are handrails on both sides of the stairs and use them. Fix handrails that are broken or loose. Make sure that handrails are as long as the stairways.  Check any carpeting to make sure that it is firmly attached to the stairs. Fix any carpet that is loose or worn.  Avoid having throw rugs at the top or bottom of the stairs. If you do have throw rugs, attach them to the floor with carpet tape.  Make sure that you have a light switch at the top of the stairs and the bottom of the stairs. If you do not have them, ask someone to add them for you. What else can I do to help prevent falls?  Wear shoes that:  Do not have high heels.  Have rubber bottoms.  Are comfortable and fit you well.  Are closed at the toe. Do not wear sandals.  If you use a stepladder:  Make sure that it is fully  opened. Do not climb a closed stepladder.  Make sure that both sides of the stepladder are locked into place.  Ask someone to hold it for you, if possible.  Clearly mark and make sure that you can see:  Any grab bars or handrails.  First and last steps.  Where the edge of each step is.  Use tools that help you move around (mobility aids) if they are needed. These include:  Canes.  Walkers.  Scooters.  Crutches.  Turn on the lights when you go into a dark area. Replace any light bulbs as soon as they burn out.  Set up your furniture so you have a clear path. Avoid moving your furniture around.  If any of your floors are uneven, fix them.  If there are any pets around you, be aware of where they are.  Review your medicines with your doctor. Some medicines can make you feel dizzy. This can increase your chance of falling. Ask your doctor what other things that you can do to help prevent falls. This information is not intended to replace advice given to you by your health care provider. Make sure you discuss any questions you have with your health care provider. Document Released: 07/10/2009 Document Revised: 02/19/2016 Document Reviewed: 10/18/2014 Elsevier Interactive Patient Education  2017 [employer].

## 2020-03-07 ENCOUNTER — Ambulatory Visit: Payer: PPO

## 2020-03-10 ENCOUNTER — Ambulatory Visit (INDEPENDENT_AMBULATORY_CARE_PROVIDER_SITE_OTHER): Payer: PPO | Admitting: Family Medicine

## 2020-03-10 ENCOUNTER — Encounter: Payer: Self-pay | Admitting: Family Medicine

## 2020-03-10 ENCOUNTER — Other Ambulatory Visit: Payer: Self-pay

## 2020-03-10 VITALS — BP 132/70 | HR 78 | Temp 97.0°F | Ht 64.25 in | Wt 152.4 lb

## 2020-03-10 DIAGNOSIS — R739 Hyperglycemia, unspecified: Secondary | ICD-10-CM

## 2020-03-10 DIAGNOSIS — D4620 Refractory anemia with excess of blasts, unspecified: Secondary | ICD-10-CM | POA: Diagnosis not present

## 2020-03-10 DIAGNOSIS — E039 Hypothyroidism, unspecified: Secondary | ICD-10-CM

## 2020-03-10 DIAGNOSIS — Z0000 Encounter for general adult medical examination without abnormal findings: Secondary | ICD-10-CM

## 2020-03-10 MED ORDER — LEVOTHYROXINE SODIUM 25 MCG PO TABS: 25.0000 ug | ORAL_TABLET | Freq: Every day | ORAL | 3 refills | Status: DC

## 2020-03-10 NOTE — Assessment & Plan Note (Signed)
Hypothyroidism  Pt has no clinical changes No change in energy level/ hair or skin/ edema and no tremor Lab Results  Component Value Date   TSH 2.59 03/05/2020    Refilled levothyroxine

## 2020-03-10 NOTE — Progress Notes (Signed)
Subjective:    Patient ID: Carla [name], female    DOB: 1950-10-29, 69 y.o.   MRN: [id number]  This visit occurred during the SARS-CoV-2 public health emergency.  Safety protocols were in place, including screening questions prior to the visit, additional usage of staff PPE, and extensive cleaning of exam room while observing appropriate contact time as indicated for disinfecting solutions.    HPI Here for health maintenance exam and to review chronic medical problems    Wt Readings from Last 3 Encounters:  03/10/20 152 lb 7 oz (69.1 kg)  02/26/20 154 lb (69.9 kg)  01/28/20 156 lb (70.8 kg)  great weight  25.96 kg/m  Has been bad about exercise after class stopped  Wants to start doing at home    Had amw on 6/10  No gaps or abn screens   She is vaccinated for covid  Also had shingrix vaccines   Mammogram 6/20 - due this month/she will set it up  Self breast exam -no lumps   Colonoscopy 11/16 with 10 y recall   dexa 5/19 = BMD in the normal range [place] - none Fractures -none Supplements - takes ca and D  Exercise -getting back   BP Readings from Last 3 Encounters:  03/10/20 132/70  02/26/20 117/63  02/11/20 122/69   Pulse Readings from Last 3 Encounters:  03/10/20 78  02/26/20 70  02/11/20 67   Hypothyroidism  Pt has no clinical changes No change in energy level/ hair or skin/ edema and no tremor Lab Results  Component Value Date   TSH 2.59 03/05/2020     Taking 25 mcg of levothy   Has myelodysplastic syndrome Sees hematology Taking aranesp injections  Lab Results  Component Value Date   WBC 11.2 (H) 03/05/2020   HGB 8.1 Repeated and verified X2. (L) 03/05/2020   HCT 25.0 (L) 03/05/2020   MCV 100.8 (H) 03/05/2020   PLT 237.0 03/05/2020   Stable from 6/1  Her Hb keeps dropping per pt - continues to inc aranesp  Does not notice a lot of symptoms (feels down a few days after infusion)  Does feel tired but gets out of breath at times and needs a  nap in the afternoons    Glucose elevation in the past Lab Results  Component Value Date   HGBA1C 5.9 03/05/2020  this is down from 6.1   Tries to eat better  Limits her pepsi   Cholesterol Lab Results  Component Value Date   CHOL 135 03/05/2020   CHOL 159 03/02/2019   CHOL 161 12/20/2017   Lab Results  Component Value Date   HDL 53.80 03/05/2020   HDL 55.10 03/02/2019   HDL 58.80 12/20/2017   Lab Results  Component Value Date   LDLCALC 70 03/05/2020   Scenic 92 03/02/2019   LDLCALC 89 12/20/2017   Lab Results  Component Value Date   TRIG 54.0 03/05/2020   TRIG 61.0 03/02/2019   TRIG 66.0 12/20/2017   Lab Results  Component Value Date   CHOLHDL 3 03/05/2020   CHOLHDL 3 03/02/2019   CHOLHDL 3 12/20/2017   Lab Results  Component Value Date   LDLDIRECT 123.0 09/16/2009   LDLDIRECT 136.9 06/26/2008  good cholesterol profile   Patient Active Problem List   Diagnosis Date Noted  . 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 Elevated ferritin 12/22/2017  . Hypothyroid 12/20/2017  . Blood glucose elevated 12/20/2017  . Normocytic anemia 12/19/2017  . Need  for hepatitis C screening test 11/25/2016  . Initial Medicare annual wellness visit 10/10/2015  . Estrogen deficiency 10/10/2015  . Encounter for routine gynecological examination 10/08/2014  . Other screening mammogram 07/05/2012  . Routine gynecological examination 06/22/2011  . Routine general medical examination at a health care facility 06/15/2011  . ALLERGIC RHINITIS 06/26/2008   Past Medical History:  Diagnosis Date  . Allergic rhinitis, cause unspecified   . Anemia, unspecified   . Carpal tunnel syndrome   . Cystitis, unspecified   . Family history of osteoporosis   . PONV (postoperative nausea and vomiting)   . Postnasal drip   . Syncope and collapse    Past Surgical History:  Procedure Laterality Date  . CARPAL TUNNEL RELEASE    . COLONOSCOPY  2005  . COLONOSCOPY  WITH PROPOFOL N/A 07/31/2015   Procedure: COLONOSCOPY WITH PROPOFOL;  Surgeon: Ronald Lobo, MD;  Location: [facility];  Service: Endoscopy;  Laterality: N/A;  . DIAGNOSTIC LAPAROSCOPY     dx lack of pregnancy   . EYE SURGERY     lasik, cataract, prk,yag procedure   Social History   Tobacco Use  . Smoking status: Never Smoker  . Smokeless tobacco: Never Used  Vaping Use  . Vaping Use: Never used  Substance Use Topics  . Alcohol use: Yes    Alcohol/week: 0.0 standard drinks    Comment: Rare  . Drug use: No   Family History  Problem Relation Age of Onset  . Osteoporosis Mother   . Stroke Mother   . Coronary artery disease Father   . Stroke Father 69  . Diabetes Other        Aunts and uncles  . Breast cancer Paternal Aunt   . Breast cancer Maternal Aunt   . Arthritis/Rheumatoid Child    Allergies  Allergen Reactions  . Codeine Nausea Only   Current Outpatient Medications on File Prior to Visit  Medication Sig Dispense Refill  . BIOTIN PO Take 1 tablet by mouth daily.    . Calcium Carb-Cholecalciferol (CALCIUM 600+D3 PO) Take by mouth.    . cetirizine (ZYRTEC) 10 MG tablet Take 10 mg by mouth daily as needed for allergies.     . Darbepoetin Alfa (ARANESP) 500 MCG/ML SOSY injection Inject 500 mcg into the skin. Every other week    . Sulfacetamide Sodium, Acne, 10 % LOTN As directed     No current facility-administered medications on file prior to visit.     Review of Systems  Constitutional: Positive for fatigue. Negative for activity change, appetite change, fever and unexpected weight change.  HENT: Negative for congestion, ear pain, rhinorrhea, sinus pressure and sore throat.   Eyes: Negative for pain, redness and visual disturbance.  Respiratory: Negative for cough, shortness of breath and wheezing.        Less exercise tolerance  Cardiovascular: Negative for chest pain and palpitations.  Gastrointestinal: Negative for abdominal pain, blood in stool,  constipation and diarrhea.  Endocrine: Negative for polydipsia and polyuria.  Genitourinary: Negative for dysuria, frequency and urgency.  Musculoskeletal: Negative for arthralgias, back pain and myalgias.  Skin: Negative for pallor and rash.  Allergic/Immunologic: Negative for environmental allergies.  Neurological: Negative for dizziness, syncope and headaches.  Hematological: Negative for adenopathy. Does not bruise/bleed easily.  Psychiatric/Behavioral: Negative for decreased concentration and dysphoric mood. The patient is not nervous/anxious.        Objective:   Physical Exam Constitutional:      General: She is not in acute  distress.    Appearance: Normal appearance. She is well-developed and normal weight. She is not ill-appearing or diaphoretic.  HENT:     Head: Normocephalic and atraumatic.     Right Ear: Tympanic membrane, ear canal and external ear normal.     Left Ear: Tympanic membrane, ear canal and external ear normal.     Nose: Nose normal. No congestion.     Mouth/Throat:     Mouth: Mucous membranes are moist.     Pharynx: Oropharynx is clear. No posterior oropharyngeal erythema.  Eyes:     General: No scleral icterus.    Extraocular Movements: Extraocular movements intact.     Conjunctiva/sclera: Conjunctivae normal.     Pupils: Pupils are equal, round, and reactive to light.  Neck:     Thyroid: No thyromegaly.     Vascular: No carotid bruit or JVD.  Cardiovascular:     Rate and Rhythm: Normal rate and regular rhythm.     Pulses: Normal pulses.     Heart sounds: Normal heart sounds. No gallop.   Pulmonary:     Effort: Pulmonary effort is normal. No respiratory distress.     Breath sounds: Normal breath sounds. No wheezing.     Comments: Good air exch Chest:     Chest wall: No tenderness.  Abdominal:     General: Bowel sounds are normal. There is no distension or abdominal bruit.     Palpations: Abdomen is soft. There is no mass.     Tenderness: There  is no abdominal tenderness.     Hernia: No hernia is present.  Genitourinary:    Comments: Breast exam: No mass, nodules, thickening, tenderness, bulging, retraction, inflamation, nipple discharge or skin changes noted.  No axillary or clavicular LA.     Musculoskeletal:        General: No tenderness. Normal range of motion.     Cervical back: Normal range of motion and neck supple. No rigidity. No muscular tenderness.     Right lower leg: No edema.     Left lower leg: No edema.  Lymphadenopathy:     Cervical: No cervical adenopathy.  Skin:    General: Skin is warm and dry.     Coloration: Skin is not pale.     Findings: No erythema or rash.     Comments: Fair complexion Some sks  Scattered lentigines   Neurological:     Mental Status: She is alert. Mental status is at baseline.     Cranial Nerves: No cranial nerve deficit.     Motor: No abnormal muscle tone.     Coordination: Coordination normal.     Gait: Gait normal.     Deep Tendon Reflexes: Reflexes are normal and symmetric.  Psychiatric:        Mood and Affect: Mood normal.        Cognition and Memory: Cognition and memory normal.           Assessment & Plan:   Problem List Items Addressed This Visit      Endocrine   Hypothyroid    Hypothyroidism  Pt has no clinical changes No change in energy level/ hair or skin/ edema and no tremor Lab Results  Component Value Date   TSH 2.59 03/05/2020    Refilled levothyroxine       Relevant Medications   levothyroxine (SYNTHROID) 25 MCG tablet     Other   Routine general medical examination at a health care facility - Primary  Reviewed health habits including diet and exercise and skin cancer prevention Reviewed appropriate screening tests for age  Also reviewed health mt list, fam hx and immunization status , as well as social and family history   See HPI amw reviewed Labs reviewed  Vaccinated for covid Has also had shingrix vaccines Planning to schedule  mammogram this month Nl bmd last dexa and no falls or fx       Blood glucose elevated    Lab Results  Component Value Date   HGBA1C 5.9 03/05/2020   Improved  disc imp of low glycemic diet and wt loss to prevent DM2       MDS (myelodysplastic syndrome), low grade ([place])    Continues hematology f/u with aranesp tx  She feels fair currently  Hb in low 8 range

## 2020-03-10 NOTE — Patient Instructions (Addendum)
Schedule your mammogram after 6/26   Keep eating a healthy diet Try to get most of your carbohydrates from produce (with the exception of white potatoes)  Eat less bread/pasta/rice/snack foods/cereals/sweets and other items from the middle of the grocery store (processed carbs)    Labs look stable Continue hematology follow up

## 2020-03-10 NOTE — Assessment & Plan Note (Signed)
Reviewed health habits including diet and exercise and skin cancer prevention Reviewed appropriate screening tests for age  Also reviewed health mt list, fam hx and immunization status , as well as social and family history   See HPI amw reviewed Labs reviewed  Vaccinated for covid Has also had shingrix vaccines Planning to schedule mammogram this month Nl bmd last dexa and no falls or fx

## 2020-03-10 NOTE — Assessment & Plan Note (Signed)
Continues hematology f/u with aranesp tx  She feels fair currently  Hb in low 8 range

## 2020-03-10 NOTE — Assessment & Plan Note (Signed)
Lab Results  Component Value Date   HGBA1C 5.9 03/05/2020   Improved  disc imp of low glycemic diet and wt loss to prevent DM2

## 2020-03-11 ENCOUNTER — Inpatient Hospital Stay: Payer: PPO

## 2020-03-11 VITALS — BP 112/66 | HR 68

## 2020-03-11 DIAGNOSIS — D4620 Refractory anemia with excess of blasts, unspecified: Secondary | ICD-10-CM

## 2020-03-11 LAB — HEMATOCRIT: HCT: 23.4 % — ABNORMAL LOW (ref 36.0–46.0)

## 2020-03-11 LAB — HEMOGLOBIN: Hemoglobin: 7.5 g/dL — ABNORMAL LOW (ref 12.0–15.0)

## 2020-03-11 MED ORDER — DARBEPOETIN ALFA 500 MCG/ML IJ SOSY
500.0000 ug | PREFILLED_SYRINGE | Freq: Once | INTRAMUSCULAR | Status: AC
  Administered 2020-03-11: 500 ug via SUBCUTANEOUS
  Filled 2020-03-11: qty 1

## 2020-03-24 ENCOUNTER — Other Ambulatory Visit: Payer: Self-pay

## 2020-03-24 DIAGNOSIS — D4620 Refractory anemia with excess of blasts, unspecified: Secondary | ICD-10-CM

## 2020-03-25 ENCOUNTER — Encounter: Payer: Self-pay | Admitting: Internal Medicine

## 2020-03-25 ENCOUNTER — Other Ambulatory Visit: Payer: Self-pay

## 2020-03-25 ENCOUNTER — Inpatient Hospital Stay: Payer: PPO

## 2020-03-25 ENCOUNTER — Inpatient Hospital Stay (HOSPITAL_BASED_OUTPATIENT_CLINIC_OR_DEPARTMENT_OTHER): Payer: PPO | Admitting: Internal Medicine

## 2020-03-25 DIAGNOSIS — D4620 Refractory anemia with excess of blasts, unspecified: Secondary | ICD-10-CM

## 2020-03-25 LAB — LACTATE DEHYDROGENASE: LDH: 142 U/L (ref 98–192)

## 2020-03-25 LAB — FERRITIN: Ferritin: 313 ng/mL — ABNORMAL HIGH (ref 11–307)

## 2020-03-25 LAB — HEMOGLOBIN: Hemoglobin: 7.9 g/dL — ABNORMAL LOW (ref 12.0–15.0)

## 2020-03-25 LAB — HEMATOCRIT: HCT: 24.6 % — ABNORMAL LOW (ref 36.0–46.0)

## 2020-03-25 LAB — IRON AND TIBC
Iron: 251 ug/dL — ABNORMAL HIGH (ref 28–170)
Saturation Ratios: 89 % — ABNORMAL HIGH (ref 10.4–31.8)
TIBC: 281 ug/dL (ref 250–450)
UIBC: 30 ug/dL

## 2020-03-25 MED ORDER — DARBEPOETIN ALFA 500 MCG/ML IJ SOSY
500.0000 ug | PREFILLED_SYRINGE | Freq: Once | INTRAMUSCULAR | Status: AC
  Administered 2020-03-25: 500 ug via SUBCUTANEOUS
  Filled 2020-03-25: qty 1

## 2020-03-25 NOTE — Assessment & Plan Note (Addendum)
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 POSITIVE  For SF3B1; R-IPSS- Very Low risk.  However hemoglobin slowly trending down/symptomatic.  Currently on Aranesp every 2 weeks.  # Hemoglobin is  7.9 / normal white count normal platelets; overall STABLE; not improving. Will increase the Aranesp frequency to weekly. Discussed re: blood transfusion; proand cons; would recommend only if absolutely necessary.   #I also discussed the role of use of Luspatercept IV injection every 3 weeks; and also discussed the role of use of Revlimid. The patient's counts do not improve the next few weeks-Revlimid versus Luspatercept are reasonable options.  # DISPOSITION:  #  Aranesp today # 1 week- H&H-aranesp # in 2 weeks- H&H;aranesp # in 3 weeks; MD; labs- cbc/bmp;LDH;possible; Aranesp SQ- Dr.B

## 2020-03-25 NOTE — Progress Notes (Signed)
[facility] CONSULT NOTE  Patient Care Team: [name], [name], MD as PCP - General Rogue Bussing, [name], MD as Consulting Physician (Hematology and Oncology)  CHIEF COMPLAINTS/PURPOSE OF CONSULTATION: Anemia   Oncology History Overview Note   # July 2020-myelodysplastic syndrome with ringed sideroblasts-Normal karyotype; no blasts [IPSS R-Very low risk ~median survival 8.3 years]; iron studies Z61 folic acid myeloma panel normal; pyridoxine levels/copper/zinc-WNL. Erythropoietin levels-60. II OPINION at [facility] [Dr.DeCastro]  # [facility]/ NGS: TET2([id number])c.2524delT(p.Ser842GlnfsTer31) Exon 3 frame-shift SF3B1([id number])c.2098A>G(p.Lys700Glu) Exon 15 missense  DNMT3A([id number])c.2204A>G(p.Tyr735Cys) Exon 19 missense   # JAN 11th 2020- Aranesp/retacrit  #Mild hypothyroidism-on Synthroid.  # colonoscopy- 2016 [Dr.Bucinni];  DIAGNOSIS: MDS/low-grade with ringed sideroblasts  STAGE: Low       ;  GOALS: Control  CURRENT/MOST RECENT THERAPY : EPO agent    MDS (myelodysplastic syndrome), low grade ([place])  04/23/2019 Initial Diagnosis   MDS (myelodysplastic syndrome), low grade ([facility])      HISTORY OF PRESENTING ILLNESS:  Carla [name] 68 y.o.  female history of low-grade MDS anemia with ring sideroblasts currently on Aranesp is here for follow-up.  Patient continues to be fatigued. She continues to complain of shortness of breath on exertion. She feels sleepy. No syncopal episodes no falls.  No blood in stools no blood per stools. No unusual body aches or joint pains.  Review of Systems  Constitutional: Positive for malaise/fatigue. Negative for chills, diaphoresis and fever.  HENT: Negative for nosebleeds and sore throat.   Eyes: Negative for double vision.  Respiratory: Negative for cough, hemoptysis, sputum production, shortness of breath and wheezing.   Cardiovascular: Positive for leg swelling. Negative for chest pain and orthopnea.  Gastrointestinal: Negative for  abdominal pain, blood in stool, constipation, diarrhea, heartburn, melena and vomiting.  Genitourinary: Negative for dysuria, frequency and urgency.  Musculoskeletal: Positive for back pain and joint pain.  Skin: Negative.  Negative for itching and rash.  Neurological: Negative for dizziness, tingling, focal weakness, weakness and headaches.  Endo/Heme/Allergies: Does not bruise/bleed easily.  Psychiatric/Behavioral: Negative for depression. The patient is not nervous/anxious and does not have insomnia.     MEDICAL HISTORY:  Past Medical History:  Diagnosis Date  . Allergic rhinitis, cause unspecified   . Anemia, unspecified   . Carpal tunnel syndrome   . Cystitis, unspecified   . Family history of osteoporosis   . PONV (postoperative nausea and vomiting)   . Postnasal drip   . Syncope and collapse     SURGICAL HISTORY: Past Surgical History:  Procedure Laterality Date  . CARPAL TUNNEL RELEASE    . COLONOSCOPY  2005  . COLONOSCOPY WITH PROPOFOL N/A 07/31/2015   Procedure: COLONOSCOPY WITH PROPOFOL;  Surgeon: Ronald Lobo, MD;  Location: [facility];  Service: Endoscopy;  Laterality: N/A;  . DIAGNOSTIC LAPAROSCOPY     dx lack of pregnancy   . EYE SURGERY     lasik, cataract, prk,yag procedure    SOCIAL HISTORY: Social History   Socioeconomic History  . Marital status: Married    Spouse name: Not on file  . Number of children: Not on file  . Years of education: Not on file  . Highest education level: Not on file  Occupational History  . Not on file  Tobacco Use  . Smoking status: Never Smoker  . Smokeless tobacco: Never Used  Vaping Use  . Vaping Use: Never used  Substance and Sexual Activity  . Alcohol use: Yes    Alcohol/week: 0.0 standard drinks    Comment: Rare  .  Drug use: No  . Sexual activity: Not Currently  Other Topics Concern  . Not on file  Social History Narrative   No regular exercise      Drinks lots of Pepsi         Social Determinants  of Health   Financial Resource Strain: Low Risk   . Difficulty of Paying Living Expenses: Not hard at all  Food Insecurity: No Food Insecurity  . Worried About Charity fundraiser in the Last Year: Never true  . Ran Out of Food in the Last Year: Never true  Transportation Needs: No Transportation Needs  . Lack of Transportation (Medical): No  . Lack of Transportation (Non-Medical): No  Physical Activity: Inactive  . Days of Exercise per Week: 0 days  . Minutes of Exercise per Session: 0 min  Stress: No Stress Concern Present  . Feeling of Stress : Not at all  Social Connections:   . Frequency of Communication with Friends and Family:   . Frequency of Social Gatherings with Friends and Family:   . Attends Religious Services:   . Active Member of Clubs or Organizations:   . Attends Archivist Meetings:   Marland Kitchen Marital Status:   Intimate Partner Violence: Not At Risk  . Fear of Current or Ex-Partner: No  . Emotionally Abused: No  . Physically Abused: No  . Sexually Abused: No    FAMILY HISTORY: Family History  Problem Relation Age of Onset  . Osteoporosis Mother   . Stroke Mother   . Coronary artery disease Father   . Stroke Father 71  . Diabetes Other        Aunts and uncles  . Breast cancer Paternal Aunt   . Breast cancer Maternal Aunt   . Arthritis/Rheumatoid Child     ALLERGIES:  is allergic to codeine.  MEDICATIONS:  Current Outpatient Medications  Medication Sig Dispense Refill  . BIOTIN PO Take 1 tablet by mouth daily.    . Calcium Carb-Cholecalciferol (CALCIUM 600+D3 PO) Take by mouth.    . cetirizine (ZYRTEC) 10 MG tablet Take 10 mg by mouth daily as needed for allergies.     . Darbepoetin Alfa (ARANESP) 500 MCG/ML SOSY injection Inject 500 mcg into the skin. Every other week    . levothyroxine (SYNTHROID) 25 MCG tablet Take 1 tablet (25 mcg total) by mouth daily before breakfast. 90 tablet 3  . Sulfacetamide Sodium, Acne, 10 % LOTN As directed      No current facility-administered medications for this visit.      PHYSICAL EXAMINATION:   Vitals:   03/25/20 0938  BP: (!) 117/51  Pulse: 77  Temp: 98.2 F (36.8 C)  SpO2: 100%   Filed Weights   03/25/20 0938  Weight: 154 lb (69.9 kg)    Physical Exam Constitutional:      Comments: Appears pale. Walk independently.  HENT:     Head: Normocephalic and atraumatic.     Mouth/Throat:     Pharynx: No oropharyngeal exudate.  Eyes:     Pupils: Pupils are equal, round, and reactive to light.  Cardiovascular:     Rate and Rhythm: Normal rate and regular rhythm.  Pulmonary:     Effort: Pulmonary effort is normal. No respiratory distress.     Breath sounds: Normal breath sounds. No wheezing.  Abdominal:     General: Bowel sounds are normal. There is no distension.     Palpations: Abdomen is soft. There is no mass.  Tenderness: There is no abdominal tenderness. There is no guarding or rebound.  Musculoskeletal:        General: No tenderness. Normal range of motion.     Cervical back: Normal range of motion and neck supple.  Skin:    General: Skin is warm.  Neurological:     Mental Status: She is alert and oriented to person, place, and time.  Psychiatric:        Mood and Affect: Affect normal.     LABORATORY DATA:  I have reviewed the data as listed Lab Results  Component Value Date   WBC 11.2 (H) 03/05/2020   HGB 7.9 (L) 03/25/2020   HCT 24.6 (L) 03/25/2020   MCV 100.8 (H) 03/05/2020   PLT 237.0 03/05/2020   Recent Labs    10/01/19 1418 10/01/19 1418 11/19/19 1411 12/17/19 1327 03/05/20 0927  NA 137   < > 138 137 137  K 3.6   < > 3.8 3.9 4.1  CL 104   < > 102 102 104  CO2 25   < > '25 26 27  ' GLUCOSE 166*   < > 142* 122* 97  BUN 20   < > '20 20 15  ' CREATININE 0.83   < > 0.80 0.88 0.84  CALCIUM 9.0   < > 9.1 9.0 9.5  GFRNONAA >60  --  >60 >60  --   GFRAA >60  --  >60 >60  --   PROT  --   --   --   --  7.9  ALBUMIN  --   --   --   --  4.5  AST  --    --   --   --  15  ALT  --   --   --   --  12  ALKPHOS  --   --   --   --  44  BILITOT  --   --   --   --  0.7   < > = values in this interval not displayed.     No results found.  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 POSITIVE  For SF3B1; R-IPSS- Very Low risk.  However hemoglobin slowly trending down/symptomatic.  Currently on Aranesp every 2 weeks.  # Hemoglobin is  7.9 / normal white count normal platelets; overall STABLE; not improving. Will increase the Aranesp frequency to weekly. Discussed re: blood transfusion; proand cons; would recommend only if absolutely necessary.   #I also discussed the role of use of Luspatercept IV injection every 3 weeks; and also discussed the role of use of Revlimid. The patient's counts do not improve the next few weeks-Revlimid versus Luspatercept are reasonable options.  # DISPOSITION:  #  Aranesp today # 1 week- H&H-aranesp # in 2 weeks- H&H;aranesp # in 3 weeks; MD; labs- cbc/bmp;LDH;possible; Aranesp SQ- Dr.B  All questions were answered. The patient knows to call the clinic with any problems, questions or concerns.   Cammie Sickle, MD 03/25/2020 12:14 PM

## 2020-03-27 ENCOUNTER — Other Ambulatory Visit: Payer: Self-pay | Admitting: Family Medicine

## 2020-03-27 DIAGNOSIS — Z1231 Encounter for screening mammogram for malignant neoplasm of breast: Secondary | ICD-10-CM

## 2020-04-01 ENCOUNTER — Telehealth: Payer: Self-pay | Admitting: Internal Medicine

## 2020-04-01 ENCOUNTER — Inpatient Hospital Stay: Payer: PPO | Attending: Internal Medicine

## 2020-04-01 ENCOUNTER — Other Ambulatory Visit: Payer: Self-pay

## 2020-04-01 ENCOUNTER — Inpatient Hospital Stay: Payer: PPO

## 2020-04-01 ENCOUNTER — Other Ambulatory Visit: Payer: Self-pay | Admitting: *Deleted

## 2020-04-01 VITALS — BP 108/66 | HR 75

## 2020-04-01 DIAGNOSIS — D4620 Refractory anemia with excess of blasts, unspecified: Secondary | ICD-10-CM

## 2020-04-01 DIAGNOSIS — R5381 Other malaise: Secondary | ICD-10-CM | POA: Insufficient documentation

## 2020-04-01 DIAGNOSIS — E039 Hypothyroidism, unspecified: Secondary | ICD-10-CM | POA: Diagnosis not present

## 2020-04-01 DIAGNOSIS — R0609 Other forms of dyspnea: Secondary | ICD-10-CM | POA: Diagnosis not present

## 2020-04-01 DIAGNOSIS — D461 Refractory anemia with ring sideroblasts: Secondary | ICD-10-CM | POA: Insufficient documentation

## 2020-04-01 DIAGNOSIS — R5383 Other fatigue: Secondary | ICD-10-CM | POA: Insufficient documentation

## 2020-04-01 DIAGNOSIS — Z79899 Other long term (current) drug therapy: Secondary | ICD-10-CM | POA: Insufficient documentation

## 2020-04-01 LAB — HEMATOCRIT: HCT: 22.4 % — ABNORMAL LOW (ref 36.0–46.0)

## 2020-04-01 LAB — HEMOGLOBIN: Hemoglobin: 7.4 g/dL — ABNORMAL LOW (ref 12.0–15.0)

## 2020-04-01 MED ORDER — DARBEPOETIN ALFA 500 MCG/ML IJ SOSY
500.0000 ug | PREFILLED_SYRINGE | Freq: Once | INTRAMUSCULAR | Status: AC
  Administered 2020-04-01: 500 ug via SUBCUTANEOUS
  Filled 2020-04-01: qty 1

## 2020-04-01 NOTE — Telephone Encounter (Signed)
On 7/06-I left voicemail for the patient to discuss the recent drop in hemoglobin to 7.4.  Recommend calling us to discuss further. GB

## 2020-04-03 ENCOUNTER — Telehealth: Payer: Self-pay | Admitting: Internal Medicine

## 2020-04-03 NOTE — Telephone Encounter (Signed)
FYI --------------------------- Hi Ms.Acre-I have been trying to reach you to discuss the results of your blood work.  Unable to leave a voicemail.  Please call us back when you get a chance.  Thank you, Dr.B

## 2020-04-08 ENCOUNTER — Other Ambulatory Visit: Payer: Self-pay

## 2020-04-08 ENCOUNTER — Inpatient Hospital Stay: Payer: PPO

## 2020-04-08 ENCOUNTER — Telehealth: Payer: Self-pay | Admitting: Internal Medicine

## 2020-04-08 ENCOUNTER — Ambulatory Visit
Admission: RE | Admit: 2020-04-08 | Discharge: 2020-04-08 | Disposition: A | Discharge status: Home / Self Care | Payer: PPO | Source: Ambulatory Visit | Attending: Family Medicine | Admitting: Family Medicine

## 2020-04-08 VITALS — BP 122/56 | HR 72

## 2020-04-08 DIAGNOSIS — D4620 Refractory anemia with excess of blasts, unspecified: Secondary | ICD-10-CM

## 2020-04-08 DIAGNOSIS — Z1231 Encounter for screening mammogram for malignant neoplasm of breast: Secondary | ICD-10-CM | POA: Diagnosis not present

## 2020-04-08 DIAGNOSIS — D461 Refractory anemia with ring sideroblasts: Secondary | ICD-10-CM | POA: Diagnosis not present

## 2020-04-08 LAB — SAMPLE TO BLOOD BANK

## 2020-04-08 LAB — HEMATOCRIT: HCT: 23.1 % — ABNORMAL LOW (ref 36.0–46.0)

## 2020-04-08 LAB — HEMOGLOBIN: Hemoglobin: 7.5 g/dL — ABNORMAL LOW (ref 12.0–15.0)

## 2020-04-08 MED ORDER — DARBEPOETIN ALFA 500 MCG/ML IJ SOSY
500.0000 ug | PREFILLED_SYRINGE | Freq: Once | INTRAMUSCULAR | Status: AC
  Administered 2020-04-08: 500 ug via SUBCUTANEOUS
  Filled 2020-04-08: qty 1

## 2020-04-08 NOTE — Telephone Encounter (Signed)
On 7/13-spoke to patient regarding suboptimal response to erythropoietin.  Recommend consideration of lenalidomide/Luspatercept.  Will discuss further at next visit.

## 2020-04-09 ENCOUNTER — Encounter: Admit: 2020-04-09 | Discharge: 2020-04-09 | Payer: MEDICARE

## 2020-04-14 ENCOUNTER — Encounter: Payer: Self-pay | Admitting: Internal Medicine

## 2020-04-14 ENCOUNTER — Other Ambulatory Visit: Payer: Self-pay

## 2020-04-15 ENCOUNTER — Telehealth: Payer: Self-pay | Admitting: Pharmacy Technician

## 2020-04-15 ENCOUNTER — Inpatient Hospital Stay: Payer: PPO

## 2020-04-15 ENCOUNTER — Inpatient Hospital Stay (HOSPITAL_BASED_OUTPATIENT_CLINIC_OR_DEPARTMENT_OTHER): Payer: PPO | Admitting: Internal Medicine

## 2020-04-15 ENCOUNTER — Telehealth: Payer: Self-pay | Admitting: Pharmacist

## 2020-04-15 DIAGNOSIS — D4620 Refractory anemia with excess of blasts, unspecified: Secondary | ICD-10-CM

## 2020-04-15 DIAGNOSIS — Z7189 Other specified counseling: Secondary | ICD-10-CM | POA: Insufficient documentation

## 2020-04-15 DIAGNOSIS — D461 Refractory anemia with ring sideroblasts: Secondary | ICD-10-CM | POA: Diagnosis not present

## 2020-04-15 LAB — CBC WITH DIFFERENTIAL/PLATELET
Abs Immature Granulocytes: 0.54 10*3/uL — ABNORMAL HIGH (ref 0.00–0.07)
Basophils Absolute: 0.1 10*3/uL (ref 0.0–0.1)
Basophils Relative: 1 %
Eosinophils Absolute: 0.1 10*3/uL (ref 0.0–0.5)
Eosinophils Relative: 1 %
HCT: 23.3 % — ABNORMAL LOW (ref 36.0–46.0)
Hemoglobin: 7.5 g/dL — ABNORMAL LOW (ref 12.0–15.0)
Immature Granulocytes: 6 %
Lymphocytes Relative: 22 %
Lymphs Abs: 1.9 10*3/uL (ref 0.7–4.0)
MCH: 32.6 pg (ref 26.0–34.0)
MCHC: 32.2 g/dL (ref 30.0–36.0)
MCV: 101.3 fL — ABNORMAL HIGH (ref 80.0–100.0)
Monocytes Absolute: 1.3 10*3/uL — ABNORMAL HIGH (ref 0.1–1.0)
Monocytes Relative: 15 %
Neutro Abs: 5 10*3/uL (ref 1.7–7.7)
Neutrophils Relative %: 55 %
Platelets: 193 10*3/uL (ref 150–400)
RBC: 2.3 MIL/uL — ABNORMAL LOW (ref 3.87–5.11)
RDW: 31.5 % — ABNORMAL HIGH (ref 11.5–15.5)
Smear Review: NORMAL
WBC: 8.9 10*3/uL (ref 4.0–10.5)
nRBC: 2 % — ABNORMAL HIGH (ref 0.0–0.2)

## 2020-04-15 LAB — BASIC METABOLIC PANEL
Anion gap: 7 (ref 5–15)
BUN: 19 mg/dL (ref 8–23)
CO2: 27 mmol/L (ref 22–32)
Calcium: 8.9 mg/dL (ref 8.9–10.3)
Chloride: 106 mmol/L (ref 98–111)
Creatinine, Ser: 0.98 mg/dL (ref 0.44–1.00)
GFR calc Af Amer: >60 mL/min (ref >60)
GFR calc non Af Amer: 59 mL/min — ABNORMAL LOW (ref >60)
Glucose, Bld: 110 mg/dL — ABNORMAL HIGH (ref 70–99)
Potassium: 4 mmol/L (ref 3.5–5.1)
Sodium: 140 mmol/L (ref 135–145)

## 2020-04-15 LAB — SAMPLE TO BLOOD BANK

## 2020-04-15 LAB — LACTATE DEHYDROGENASE: LDH: 153 U/L (ref 98–192)

## 2020-04-15 MED ORDER — LENALIDOMIDE 10 MG PO CAPS
10.0000 mg | ORAL_CAPSULE | Freq: Every day | ORAL | 0 refills | Status: DC
Start: 2020-04-15 — End: 2020-04-17

## 2020-04-15 MED ORDER — DARBEPOETIN ALFA 500 MCG/ML IJ SOSY
500.0000 ug | PREFILLED_SYRINGE | Freq: Once | INTRAMUSCULAR | Status: AC
  Administered 2020-04-15: 500 ug via SUBCUTANEOUS
  Filled 2020-04-15: qty 1

## 2020-04-15 NOTE — Assessment & Plan Note (Addendum)
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  Poor response to Aranesp.  #Continue Aranesp 500 mcg weekly subcu injections.  Hemoglobin 7.5; given the lack of significant response-discussed additional therapies.  Also discussed regarding PRBC transfusion as needed.  Hold transfusion today.  #Discussed option of Revlimid-10 mg/daily; continuous.  Based upon SF 3 B1 mutation-response rates higher-at about 50%-60%. Discussed the potential side effects of Revlimid including but not limited to diarrhea skin rash thromboembolic events.  Recommend aspirin.  Also discussed the potential teratogenic side effects; and also enrolled in REMs program.  Recommend aspirin 81 mg a day.  Understands treatments are palliative not curative.  #I also discussed option of subcu therapy with Luspatercept every 3 weeks.  Discussed the potential effects of cytopenias; bone pain; metabolic derangements etc.  Response rates below 40%.  #After extensive discussion patient is interested in Revlimid.  Started the ordering process.  Patient start approximately 2 weeks.  # DISPOSITION:  #  Aranesp today # 1 week- H&H; HOLD tube-aranesp # follow up in 2 weeks; MD; labs- cbc/bmp;LDH;HOLD tube;possible; Aranesp SQ- Dr.B

## 2020-04-15 NOTE — Progress Notes (Signed)
Pt consented to be enrolled in the Revlimid REMS program.

## 2020-04-15 NOTE — Telephone Encounter (Signed)
Oral Oncology Pharmacist Encounter  Received new prescription for lenalidomide (Revlimid) for the treatment of low grade myelodysplastic syndrome, planned duration until disease progression or unacceptable drug toxicity.  Labs from 7/20 assessed. Note creatinine of 0.98 and eCrCl ~46 ml/min using IBW, ~60 ml/min using ABW of 70.3 kg. Prescription dose and frequency assessed and are appropriate, though with borderline renal function will need to monitor side effects and renal function closely. Hgb around baseline at 7.5.  Current medication list in Epic reviewed, no DDIs with Revlimid identified:  Prescription has been e-scribed to [employer] for benefits analysis and approval.  Oral Oncology Clinic will continue to follow for initial counseling and start date.  Carla [name], PharmD PGY2 Hematology/Oncology Pharmacy Resident Oral Chemotherapy Navigation Clinic 04/15/2020 12:08 PM

## 2020-04-15 NOTE — Telephone Encounter (Signed)
Oral Chemotherapy Pharmacist Encounter  Patient Education I spoke with patient for overview of new oral chemotherapy medication: Revlimid (lenalidomide) for the treatment of low grade myelodysplastic syndrome, planned duration until disease progression or unacceptable drug toxicity.  Pt is doing well. Counseled patient on administration, dosing, side effects, monitoring, drug-food interactions, safe handling, storage, and disposal.  Patient will take 10 mg daily with or without food.   Side effects include but not limited to: N/V/D/C, myelosuppression, rash/itchy skin.    Reviewed with patient importance of keeping a medication schedule and plan for any missed doses.  Patient did state that Dr. Rogue Bussing educated her to take aspirin 81 mg daily and she was encouraged to purchase a supply over the counter so she can start the aspirin when she starts Revlimid.   Ms. Drummond voiced understanding and appreciation. All questions answered.  Provided patient with [facility] phone number. Patient knows to call the office with questions or concerns. Oral Chemotherapy [facility] will continue to follow.  Eddie Candle, PharmD PGY2 Hematology/Oncology Pharmacy Resident Oral Chemotherapy Navigation Clinic 04/15/2020 2:45 PM

## 2020-04-15 NOTE — Progress Notes (Signed)
[facility] CONSULT NOTE  Patient Care Team: [name], [name], MD as PCP - General Rogue Bussing, [name], MD as Consulting Physician (Hematology and Oncology)  CHIEF COMPLAINTS/PURPOSE OF CONSULTATION: Anemia   Oncology History Overview Note   # July 2020-myelodysplastic syndrome with ringed sideroblasts-Normal karyotype; no blasts [IPSS R-Very low risk ~median survival 8.3 years]; iron studies F02 folic acid myeloma panel normal; pyridoxine levels/copper/zinc-WNL. Erythropoietin levels-60. II OPINION at [facility] [Dr.DeCastro]  # [facility]/ NGS: TET2([id number])c.2524delT(p.Ser842GlnfsTer31) Exon 3 frame-shift SF3B1([id number])c.2098A>G(p.Lys700Glu) Exon 15 missense  DNMT3A(NM_[id number])c.2204A>G(p.Tyr735Cys) Exon 19 missense   # JAN 11th 2020- Aranesp/retacrit  #Mild hypothyroidism-on Synthroid.  # colonoscopy- 2016 [Dr.Bucinni];  DIAGNOSIS: MDS/low-grade with ringed sideroblasts  STAGE: Low       ;  GOALS: Control  CURRENT/MOST RECENT THERAPY : EPO agent    MDS (myelodysplastic syndrome), low grade ([place])  04/23/2019 Initial Diagnosis   MDS (myelodysplastic syndrome), low grade ([facility])      HISTORY OF PRESENTING ILLNESS:  Carla [name] 69 y.o.  female history of low-grade MDS anemia with ring sideroblasts currently on Aranesp is here for follow-up.  Patient continues to be tired.  Feels sleepy.  Complains of shortness of breath on exertion.  No blood in stools or black or stools.  No nausea no vomiting.  Review of Systems  Constitutional: Positive for malaise/fatigue. Negative for chills, diaphoresis and fever.  HENT: Negative for nosebleeds and sore throat.   Eyes: Negative for double vision.  Respiratory: Negative for cough, hemoptysis, sputum production, shortness of breath and wheezing.   Cardiovascular: Positive for leg swelling. Negative for chest pain and orthopnea.  Gastrointestinal: Negative for abdominal pain, blood in stool, constipation, diarrhea, heartburn,  melena and vomiting.  Genitourinary: Negative for dysuria, frequency and urgency.  Musculoskeletal: Positive for back pain and joint pain.  Skin: Negative.  Negative for itching and rash.  Neurological: Negative for dizziness, tingling, focal weakness, weakness and headaches.  Endo/Heme/Allergies: Does not bruise/bleed easily.  Psychiatric/Behavioral: Negative for depression. The patient is not nervous/anxious and does not have insomnia.     MEDICAL HISTORY:  Past Medical History:  Diagnosis Date  . Allergic rhinitis, cause unspecified   . Anemia, unspecified   . Carpal tunnel syndrome   . Cystitis, unspecified   . Family history of osteoporosis   . PONV (postoperative nausea and vomiting)   . Postnasal drip   . Syncope and collapse     SURGICAL HISTORY: Past Surgical History:  Procedure Laterality Date  . CARPAL TUNNEL RELEASE    . COLONOSCOPY  2005  . COLONOSCOPY WITH PROPOFOL N/A 07/31/2015   Procedure: COLONOSCOPY WITH PROPOFOL;  Surgeon: Ronald Lobo, MD;  Location: [facility];  Service: Endoscopy;  Laterality: N/A;  . DIAGNOSTIC LAPAROSCOPY     dx lack of pregnancy   . EYE SURGERY     lasik, cataract, prk,yag procedure    SOCIAL HISTORY: Social History   Socioeconomic History  . Marital status: Married    Spouse name: Not on file  . Number of children: Not on file  . Years of education: Not on file  . Highest education level: Not on file  Occupational History  . Not on file  Tobacco Use  . Smoking status: Never Smoker  . Smokeless tobacco: Never Used  Vaping Use  . Vaping Use: Never used  Substance and Sexual Activity  . Alcohol use: Yes    Alcohol/week: 0.0 standard drinks    Comment: Rare  . Drug use: No  . Sexual activity:  Not Currently  Other Topics Concern  . Not on file  Social History Narrative   No regular exercise      Drinks lots of [employer]epsi         Social Determinants of Health   Financial Resource Strain: Low Risk   . Difficulty  of Paying Living Expenses: Not hard at all  Food Insecurity: No Food Insecurity  . Worried About Charity fundraiser in the Last Year: Never true  . Ran Out of Food in the Last Year: Never true  Transportation Needs: No Transportation Needs  . Lack of Transportation (Medical): No  . Lack of Transportation (Non-Medical): No  Physical Activity: Inactive  . Days of Exercise per Week: 0 days  . Minutes of Exercise per Session: 0 min  Stress: No Stress Concern Present  . Feeling of Stress : Not at all  Social Connections:   . Frequency of Communication with Friends and Family:   . Frequency of Social Gatherings with Friends and Family:   . Attends Religious Services:   . Active Member of Clubs or Organizations:   . Attends Archivist Meetings:   Marland Kitchen Marital Status:   Intimate Partner Violence: Not At Risk  . Fear of Current or Ex-Partner: No  . Emotionally Abused: No  . Physically Abused: No  . Sexually Abused: No    FAMILY HISTORY: Family History  Problem Relation Age of Onset  . Osteoporosis Mother   . Stroke Mother   . Coronary artery disease Father   . Stroke Father 48  . Diabetes Other        Aunts and uncles  . Breast cancer Paternal Aunt   . Breast cancer Maternal Aunt   . Arthritis/Rheumatoid Child   . Breast cancer Cousin     ALLERGIES:  is allergic to codeine.  MEDICATIONS:  Current Outpatient Medications  Medication Sig Dispense Refill  . BIOTIN PO Take 1 tablet by mouth daily.    . Calcium Carb-Cholecalciferol (CALCIUM 600+D3 PO) Take by mouth.    . cetirizine (ZYRTEC) 10 MG tablet Take 10 mg by mouth daily as needed for allergies.     . Darbepoetin Alfa (ARANESP) 500 MCG/ML SOSY injection Inject 500 mcg into the skin. Every other week    . levothyroxine (SYNTHROID) 25 MCG tablet Take 1 tablet (25 mcg total) by mouth daily before breakfast. 90 tablet 3  . Sulfacetamide Sodium, Acne, 10 % LOTN As directed    . lenalidomide (REVLIMID) 10 MG capsule  Take 1 capsule (10 mg total) by mouth daily. [employer] Auth # [id number]     Date Obtained 04/15/2020 30 capsule 0   No current facility-administered medications for this visit.      PHYSICAL EXAMINATION:   Vitals:   04/15/20 1047  BP: (!) 112/50  Pulse: 76  Resp: 16  Temp: 98.3 F (36.8 C)  SpO2: 100%   Filed Weights   04/15/20 1047  Weight: 155 lb (70.3 kg)    Physical Exam Constitutional:      Comments: Appears pale. Walk independently.  HENT:     Head: Normocephalic and atraumatic.     Mouth/Throat:     Pharynx: No oropharyngeal exudate.  Eyes:     Pupils: Pupils are equal, round, and reactive to light.  Cardiovascular:     Rate and Rhythm: Normal rate and regular rhythm.     Pulses: Normal pulses.     Heart sounds: Normal heart sounds.  Pulmonary:  Effort: Pulmonary effort is normal. No respiratory distress.     Breath sounds: Normal breath sounds. No wheezing.  Abdominal:     General: Bowel sounds are normal. There is no distension.     Palpations: Abdomen is soft. There is no mass.     Tenderness: There is no abdominal tenderness. There is no guarding or rebound.  Musculoskeletal:        General: No tenderness. Normal range of motion.     Cervical back: Normal range of motion and neck supple.  Skin:    General: Skin is warm.  Neurological:     Mental Status: She is alert and oriented to person, place, and time.  Psychiatric:        Mood and Affect: Affect normal.     LABORATORY DATA:  I have reviewed the data as listed Lab Results  Component Value Date   WBC 8.9 04/15/2020   HGB 7.5 (L) 04/15/2020   HCT 23.3 (L) 04/15/2020   MCV 101.3 (H) 04/15/2020   PLT 193 04/15/2020   Recent Labs    11/19/19 1411 11/19/19 1411 12/17/19 1327 03/05/20 0927 04/15/20 1034  NA 138   < > 137 137 140  K 3.8   < > 3.9 4.1 4.0  CL 102   < > 102 104 106  CO2 25   < > '26 27 27  ' GLUCOSE 142*   < > 122* 97 110*  BUN 20   < > '20 15 19  ' CREATININE 0.80   < >  0.88 0.84 0.98  CALCIUM 9.1   < > 9.0 9.5 8.9  GFRNONAA >60  --  >60  --  59*  GFRAA >60  --  >60  --  >60  PROT  --   --   --  7.9  --   ALBUMIN  --   --   --  4.5  --   AST  --   --   --  15  --   ALT  --   --   --  12  --   ALKPHOS  --   --   --  44  --   BILITOT  --   --   --  0.7  --    < > = values in this interval not displayed.     MM 3D SCREEN BREAST BILATERAL  Result Date: 04/10/2020 CLINICAL DATA:  Screening. EXAM: DIGITAL SCREENING BILATERAL MAMMOGRAM WITH TOMO AND CAD COMPARISON:  Previous exam(s). ACR Breast Density Category b: There are scattered areas of fibroglandular density. FINDINGS: There are no findings suspicious for malignancy. Images were processed with CAD. IMPRESSION: No mammographic evidence of malignancy. A result letter of this screening mammogram will be mailed directly to the patient. RECOMMENDATION: Screening mammogram in one year. (Code:SM-B-01Y) BI-RADS CATEGORY  1: Negative. Electronically Signed   By: Audie Pinto M.D.   On: 04/10/2020 14:11    MDS (myelodysplastic syndrome), low grade ([place]) # Low grade MDS- with ringed sideroblasts; no blasts. POSITIVE  For SF3B1; R-IPSS-low risk.  Poor response to Aranesp.  #Continue Aranesp 500 mcg weekly subcu injections.  Hemoglobin 7.5; given the lack of significant response-discussed additional therapies.  Also discussed regarding PRBC transfusion as needed.  Hold transfusion today.  #Discussed option of Revlimid-10 mg/daily; continuous.  Based upon SF 3 B1 mutation-response rates higher-at about 50%-60%. Discussed the potential side effects of Revlimid including but not limited to diarrhea skin rash thromboembolic events.  Recommend aspirin.  Also discussed the potential teratogenic side effects; and also enrolled in REMs program.  Recommend aspirin 81 mg a day.  Understands treatments are palliative not curative.  #I also discussed option of subcu therapy with Luspatercept every 3 weeks.  Discussed the  potential effects of cytopenias; bone pain; metabolic derangements etc.  Response rates below 40%.  #After extensive discussion patient is interested in Revlimid.  Started the ordering process.  Patient start approximately 2 weeks.  # DISPOSITION:  #  Aranesp today # 1 week- H&H; HOLD tube-aranesp # follow up in 2 weeks; MD; labs- cbc/bmp;LDH;HOLD tube;possible; Aranesp SQ- Dr.B  All questions were answered. The patient knows to call the clinic with any problems, questions or concerns.   Cammie Sickle, MD 04/15/2020 2:47 PM

## 2020-04-15 NOTE — Telephone Encounter (Signed)
Oral Oncology Patient Advocate Encounter   Received notification from [facility]) that prior authorization for Revlimid is required.   PA submitted on CoverMyMeds Key [id number] Status is pending   Oral Oncology Clinic will continue to follow.  [place] Patient [place] Phone [phone] Fax [phone] 04/15/2020 3:08 PM

## 2020-04-16 ENCOUNTER — Telehealth: Payer: Self-pay | Admitting: Pharmacy Technician

## 2020-04-16 NOTE — Telephone Encounter (Signed)
Oral Oncology Patient Advocate Encounter  Was successful in securing patient a $10,000 grant from [facility] to provide copayment coverage for Revlimid.  This will keep the out of pocket expense at $0.     [employer] ID: [id number]  I have spoken with the patient.   The billing information is as follows and will be shared with dispensing pharmacy.    RxBin: [id number] PCN: [id number] Member ID: [id number] Group ID: [id number] Dates of Eligibility: 03/17/20 through 03/16/21  Fund:  [place] Patient [place] Phone [phone] Fax [phone] 04/16/2020 2:51 PM

## 2020-04-17 MED ORDER — LENALIDOMIDE 10 MG PO CAPS: 10.0000 mg | ORAL_CAPSULE | Freq: Every day | ORAL | 0 refills | Status: DC

## 2020-04-17 NOTE — Telephone Encounter (Signed)
Oral Oncology Patient Advocate Encounter  Prior Authorization for Revlimid has been approved.    PA# [id number] Effective dates: 04/17/20 through 04/17/21  Patients co-pay is $3644.  Patient was approved for a grant through the [employer] to help cover the out of pocket cost.  Oral Oncology Clinic will continue to follow.   [place] Patient [place] Phone [phone] Fax [phone] 04/17/2020 1:17 PM

## 2020-04-17 NOTE — Addendum Note (Signed)
Addended by: Darl Pikes on: 04/17/2020 03:38 PM   Modules accepted: Orders

## 2020-04-21 ENCOUNTER — Other Ambulatory Visit: Payer: Self-pay | Admitting: *Deleted

## 2020-04-21 DIAGNOSIS — D649 Anemia, unspecified: Secondary | ICD-10-CM

## 2020-04-21 DIAGNOSIS — D4620 Refractory anemia with excess of blasts, unspecified: Secondary | ICD-10-CM

## 2020-04-22 ENCOUNTER — Other Ambulatory Visit: Payer: Self-pay | Admitting: *Deleted

## 2020-04-22 ENCOUNTER — Inpatient Hospital Stay: Payer: PPO

## 2020-04-22 ENCOUNTER — Ambulatory Visit: Payer: PPO | Admitting: Internal Medicine

## 2020-04-22 ENCOUNTER — Other Ambulatory Visit: Payer: PPO

## 2020-04-22 ENCOUNTER — Other Ambulatory Visit: Payer: Self-pay

## 2020-04-22 ENCOUNTER — Ambulatory Visit: Payer: PPO

## 2020-04-22 VITALS — BP 120/54 | HR 76 | Wt 155.0 lb

## 2020-04-22 DIAGNOSIS — D649 Anemia, unspecified: Secondary | ICD-10-CM

## 2020-04-22 DIAGNOSIS — D46Z Other myelodysplastic syndromes: Secondary | ICD-10-CM

## 2020-04-22 DIAGNOSIS — D461 Refractory anemia with ring sideroblasts: Secondary | ICD-10-CM | POA: Diagnosis not present

## 2020-04-22 DIAGNOSIS — D4620 Refractory anemia with excess of blasts, unspecified: Secondary | ICD-10-CM

## 2020-04-22 LAB — HEMOGLOBIN: Hemoglobin: 7.2 g/dL — ABNORMAL LOW (ref 12.0–15.0)

## 2020-04-22 LAB — PREPARE RBC (CROSSMATCH)

## 2020-04-22 LAB — ABO/RH: ABO/RH(D): B POS

## 2020-04-22 LAB — HEMATOCRIT: HCT: 22.9 % — ABNORMAL LOW (ref 36.0–46.0)

## 2020-04-22 MED ORDER — DARBEPOETIN ALFA 500 MCG/ML IJ SOSY
500.0000 ug | PREFILLED_SYRINGE | Freq: Once | INTRAMUSCULAR | Status: AC
  Administered 2020-04-22: 500 ug via SUBCUTANEOUS
  Filled 2020-04-22: qty 1

## 2020-04-23 ENCOUNTER — Telehealth: Payer: Self-pay | Admitting: Internal Medicine

## 2020-04-23 DIAGNOSIS — D4620 Refractory anemia with excess of blasts, unspecified: Secondary | ICD-10-CM

## 2020-04-23 NOTE — Telephone Encounter (Signed)
On 7/27 spoke to pt re: leg swelling; anemia Hb 7.2; recommend PRBC transfusion.   Recommend follow up on 7/30.

## 2020-04-24 ENCOUNTER — Encounter: Payer: Self-pay | Admitting: Internal Medicine

## 2020-04-24 ENCOUNTER — Inpatient Hospital Stay: Payer: PPO

## 2020-04-24 ENCOUNTER — Inpatient Hospital Stay (HOSPITAL_BASED_OUTPATIENT_CLINIC_OR_DEPARTMENT_OTHER): Payer: PPO | Admitting: Internal Medicine

## 2020-04-24 ENCOUNTER — Other Ambulatory Visit: Payer: Self-pay

## 2020-04-24 DIAGNOSIS — D4620 Refractory anemia with excess of blasts, unspecified: Secondary | ICD-10-CM

## 2020-04-24 DIAGNOSIS — D461 Refractory anemia with ring sideroblasts: Secondary | ICD-10-CM | POA: Diagnosis not present

## 2020-04-24 DIAGNOSIS — D649 Anemia, unspecified: Secondary | ICD-10-CM

## 2020-04-24 MED ORDER — SODIUM CHLORIDE 0.9% IV SOLUTION
250.0000 mL | Freq: Once | INTRAVENOUS | Status: AC
  Administered 2020-04-24: 250 mL via INTRAVENOUS
  Filled 2020-04-24: qty 250

## 2020-04-24 MED ORDER — DIPHENHYDRAMINE HCL 25 MG PO CAPS
25.0000 mg | ORAL_CAPSULE | Freq: Once | ORAL | Status: AC
  Administered 2020-04-24: 25 mg via ORAL
  Filled 2020-04-24: qty 1

## 2020-04-24 MED ORDER — ACETAMINOPHEN 325 MG PO TABS
650.0000 mg | ORAL_TABLET | Freq: Once | ORAL | Status: AC
  Administered 2020-04-24: 650 mg via ORAL
  Filled 2020-04-24: qty 2

## 2020-04-24 NOTE — Progress Notes (Signed)
[facility] CONSULT NOTE  Patient Care Team: [name], [name], MD as PCP - General Rogue Bussing, [name], MD as Consulting Physician (Hematology and Oncology)  CHIEF COMPLAINTS/PURPOSE OF CONSULTATION: Anemia   Oncology History Overview Note   # July 2020-myelodysplastic syndrome with ringed sideroblasts-Normal karyotype; no blasts [IPSS R-Very low risk ~median survival 8.3 years]; iron studies U23 folic acid myeloma panel normal; pyridoxine levels/copper/zinc-WNL. Erythropoietin levels-60. 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  # colonoscopy- 2016 [Dr.Bucinni];  DIAGNOSIS: MDS/low-grade with ringed sideroblasts  STAGE: Low       ;  GOALS: Control  CURRENT/MOST RECENT THERAPY : EPO agent+ REV    [place] ([place]el[place]), [place]  04/[place] Initial Diagnosis   MDS (myelodysplastic syndrome), low grade ([facility])      HISTORY OF PRESENTING ILLNESS:  LLitha LamartinaMay 68 y.o.  female history of low-grade MDS anemia with ring sideroblasts currently on Aranesp is here for follow-up.  Patient continues to be very tired.  Continues to be short of breath on exertion.  No blood in stools or black-colored stools.  No nausea or vomiting.  Review of Systems  Constitutional: Positive for malaise/fatigue. Negative for chills, diaphoresis and fever.  HENT: Negative for nosebleeds and sore throat.   Eyes: Negative for double vision.  Respiratory: Negative for cough, hemoptysis, sputum production, shortness of breath and wheezing.   Cardiovascular: Positive for leg swelling. Negative for chest pain and orthopnea.  Gastrointestinal: Negative for  abdominal pain, blood in stool, constipation, diarrhea, heartburn, melena and vomiting.  Genitourinary: Negative for dysuria, frequency and urgency.  Musculoskeletal: Positive for back pain and joint pain.  Skin: Negative.  Negative for itching and rash.  Neurological: Negative for dizziness, tingling, focal weakness, weakness and headaches.  Endo/Heme/Allergies: Does not bruise/bleed easily.  Psychiatric/Behavioral: Negative for depression. The patient is not nervous/anxious and does not have insomnia.     MEDICAL HISTORY:  Past Medical History:  Diagnosis Date  . Allergic rhinitis, cause unspecified   . Anemia, unspecified   . Carpal tunnel syndrome   . Cystitis, unspecified   . Family history of osteoporosis   . PONV (postoperative nausea and vomiting)   . Postnasal drip   . Syncope and collapse     SURGICAL HISTORY: Past Surgical History:  Procedure Laterality Date  . CARPAL TUNNEL RELEASE    . COLONOSCOPY  2005  . COLONOSCOPY WITH PROPOFOL N/A 07/31/2015   Procedure: COLONOSCOPY WITH PROPOFOL;  Surgeon: RRonald Lobo MD;  Location: [facility];  Service: Endoscopy;  Laterality: N/A;  . DIAGNOSTIC LAPAROSCOPY     dx lack of pregnancy   . EYE SURGERY     lasik, cataract, prk,yag procedure    SOCIAL HISTORY: Social History   Socioeconomic History  . Marital status: Married    Spouse name: Not on file  . Number of children: Not on file  . Years of education: Not on file  . Highest education level: Not on file  Occupational History  . Not on file  Tobacco Use  . Smoking status: Never Smoker  . Smokeless tobacco: Never Used  Vaping Use  . Vaping Use: Never used  Substance and Sexual Activity  . Alcohol use: Yes    Alcohol/week: 0.0 standard drinks    Comment:  Rare  . Drug use: No  . Sexual activity: Not Currently  Other Topics Concern  . Not on file  Social History Narrative   No regular exercise      Drinks lots of Pepsi         Social Determinants  of Health   Financial Resource Strain: Low Risk   . Difficulty of Paying Living Expenses: Not hard at all  Food Insecurity: No Food Insecurity  . Worried About Charity fundraiser in the Last Year: Never true  . Ran Out of Food in the Last Year: Never true  Transportation Needs: No Transportation Needs  . Lack of Transportation (Medical): No  . Lack of Transportation (Non-Medical): No  Physical Activity: Inactive  . Days of Exercise per Week: 0 days  . Minutes of Exercise per Session: 0 min  Stress: No Stress Concern Present  . Feeling of Stress : Not at all  Social Connections:   . Frequency of Communication with Friends and Family:   . Frequency of Social Gatherings with Friends and Family:   . Attends Religious Services:   . Active Member of Clubs or Organizations:   . Attends Archivist Meetings:   Marland Kitchen Marital Status:   Intimate Partner Violence: Not At Risk  . Fear of Current or Ex-Partner: No  . Emotionally Abused: No  . Physically Abused: No  . Sexually Abused: No    FAMILY HISTORY: Family History  Problem Relation Age of Onset  . Osteoporosis Mother   . Stroke Mother   . Coronary artery disease Father   . Stroke Father 15  . Diabetes Other        Aunts and uncles  . Breast cancer Paternal Aunt   . Breast cancer Maternal Aunt   . Arthritis/Rheumatoid Child   . Breast cancer Cousin     ALLERGIES:  is allergic to codeine.  MEDICATIONS:  Current Outpatient Medications  Medication Sig Dispense Refill  . aspirin EC 81 MG tablet Take 81 mg by mouth daily. Swallow whole.     Marland Kitchen BIOTIN PO Take 1 tablet by mouth daily.    . Calcium Carb-Cholecalciferol (CALCIUM 600+D3 PO) Take by mouth.    . cetirizine (ZYRTEC) 10 MG tablet Take 10 mg by mouth daily as needed for allergies.     . Darbepoetin Alfa (ARANESP) 500 MCG/ML SOSY injection Inject 500 mcg into the skin. Every other week    . lenalidomide (REVLIMID) 10 MG capsule Take 1 capsule (10 mg total) by mouth  daily. 28 capsule 0  . levothyroxine (SYNTHROID) 25 MCG tablet Take 1 tablet (25 mcg total) by mouth daily before breakfast. 90 tablet 3  . Sulfacetamide Sodium, Acne, 10 % LOTN As directed     No current facility-administered medications for this visit.      PHYSICAL EXAMINATION:   Vitals:   04/24/20 0845  BP: (!) 127/47  Pulse: 84  Resp: 16  Temp: 98.3 F (36.8 C)  SpO2: 99%   Filed Weights   04/24/20 0845  Weight: 155 lb 9.6 oz (70.6 kg)    Physical Exam Constitutional:      Comments: Appears pale. Walk independently.  Accompanied by husband.  HENT:     Head: Normocephalic and atraumatic.     Mouth/Throat:     Pharynx: No oropharyngeal exudate.  Eyes:     Pupils: Pupils are equal, round, and reactive to light.  Cardiovascular:     Rate and Rhythm: Normal rate and  regular rhythm.     Pulses: Normal pulses.     Heart sounds: Normal heart sounds.  Pulmonary:     Effort: Pulmonary effort is normal. No respiratory distress.     Breath sounds: Normal breath sounds. No wheezing.  Abdominal:     General: Bowel sounds are normal. There is no distension.     Palpations: Abdomen is soft. There is no mass.     Tenderness: There is no abdominal tenderness. There is no guarding or rebound.  Musculoskeletal:        General: No tenderness. Normal range of motion.     Cervical back: Normal range of motion and neck supple.  Skin:    General: Skin is warm.     Coloration: Skin is pale.  Neurological:     Mental Status: She is alert and oriented to person, place, and time.  Psychiatric:        Mood and Affect: Affect normal.     LABORATORY DATA:  I have reviewed the data as listed Lab Results  Component Value Date   WBC 6.2 04/29/2020   HGB 8.8 (L) 04/29/2020   HCT 27.5 (L) 04/29/2020   MCV 98.6 04/29/2020   PLT 168 04/29/2020   Recent Labs    12/17/19 1327 12/17/19 1327 03/05/20 0927 04/15/20 1034 04/29/20 0928  NA 137   < > 137 140 138  K 3.9   < > 4.1  4.0 3.9  CL 102   < > 104 106 104  CO2 26   < > '27 27 27  ' GLUCOSE 122*   < > 97 110* 132*  BUN 20   < > '15 19 19  ' CREATININE 0.88   < > 0.84 0.98 0.76  CALCIUM 9.0   < > 9.5 8.9 9.1  GFRNONAA >60  --   --  59* >60  GFRAA >60  --   --  >60 >60  PROT  --   --  7.9  --  7.9  ALBUMIN  --   --  4.5  --  4.3  AST  --   --  15  --  20  ALT  --   --  12  --  14  ALKPHOS  --   --  44  --  43  BILITOT  --   --  0.7  --  1.3*   < > = values in this interval not displayed.     MM 3D SCREEN BREAST BILATERAL  Result Date: 04/10/2020 CLINICAL DATA:  Screening. EXAM: DIGITAL SCREENING BILATERAL MAMMOGRAM WITH TOMO AND CAD COMPARISON:  Previous exam(s). ACR Breast Density Category b: There are scattered areas of fibroglandular density. FINDINGS: There are no findings suspicious for malignancy. Images were processed with CAD. IMPRESSION: No mammographic evidence of malignancy. A result letter of this screening mammogram will be mailed directly to the patient. RECOMMENDATION: Screening mammogram in one year. (Code:SM-B-01Y) BI-RADS CATEGORY  1: Negative. Electronically Signed   By: Audie Pinto M.D.   On: 04/10/2020 14:11    MDS (myelodysplastic syndrome), low grade ([place]) # Low grade MDS- with ringed sideroblasts; no blasts. POSITIVE  For SF3B1; R-IPSS-low risk.  Poor response to Aranesp.   #Continue Aranesp 500 mcg weekly subcu injections.  Hemoglobin 7.2; proceed with 1 unit PRBC transfusion.  # Given the lack of significant response-recommend adding Revlimid 10 mg daily.  Again reviewed the response rates of 50 to 60%; again discussed-the potential side effects including but not limited  to skin rash diarrhea; teratogenic side effects.  # DISPOSITION:  # blood transfusion today.  # 08/03 appt- H&H; HOLD tube-aranesp # follow up ion 8/10 MD; labs- cbc/bmp;LDH;HOLD tube;possible; Aranesp SQ- Dr.B  All questions were answered. The patient knows to call the clinic with any problems, questions or  concerns.   Cammie Sickle, MD 05/02/2020 12:48 PM

## 2020-04-24 NOTE — Assessment & Plan Note (Addendum)
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  Poor response to Aranesp.   #Continue Aranesp 500 mcg weekly subcu injections.  Hemoglobin 7.2; proceed with 1 unit PRBC transfusion.  # Given the lack of significant response-recommend adding Revlimid 10 mg daily.  Again reviewed the response rates of 50 to 60%; again discussed-the potential side effects including but not limited to skin rash diarrhea; teratogenic side effects.  # DISPOSITION:  # blood transfusion today.  # 08/03 appt- H&H; HOLD tube-aranesp # follow up ion 8/10 MD; labs- cbc/bmp;LDH;HOLD tube;possible; Aranesp SQ- Dr.B

## 2020-04-25 LAB — TYPE AND SCREEN
ABO/RH(D): B POS
Antibody Screen: NEGATIVE
Unit division: 0

## 2020-04-25 LAB — BPAM RBC
Blood Product Expiration Date: 202108162359
ISSUE DATE / TIME: 202107291101
Unit Type and Rh: 7300

## 2020-04-29 ENCOUNTER — Inpatient Hospital Stay: Payer: PPO

## 2020-04-29 ENCOUNTER — Inpatient Hospital Stay: Payer: PPO | Attending: Internal Medicine

## 2020-04-29 ENCOUNTER — Other Ambulatory Visit: Payer: Self-pay

## 2020-04-29 ENCOUNTER — Inpatient Hospital Stay: Payer: PPO | Admitting: Internal Medicine

## 2020-04-29 VITALS — BP 120/70 | HR 72 | Resp 18

## 2020-04-29 DIAGNOSIS — R5383 Other fatigue: Secondary | ICD-10-CM | POA: Insufficient documentation

## 2020-04-29 DIAGNOSIS — Z79899 Other long term (current) drug therapy: Secondary | ICD-10-CM | POA: Insufficient documentation

## 2020-04-29 DIAGNOSIS — R5381 Other malaise: Secondary | ICD-10-CM | POA: Diagnosis not present

## 2020-04-29 DIAGNOSIS — J208 Acute bronchitis due to other specified organisms: Secondary | ICD-10-CM | POA: Insufficient documentation

## 2020-04-29 DIAGNOSIS — R110 Nausea: Secondary | ICD-10-CM | POA: Insufficient documentation

## 2020-04-29 DIAGNOSIS — E860 Dehydration: Secondary | ICD-10-CM | POA: Diagnosis not present

## 2020-04-29 DIAGNOSIS — Z7982 Long term (current) use of aspirin: Secondary | ICD-10-CM | POA: Insufficient documentation

## 2020-04-29 DIAGNOSIS — D4620 Refractory anemia with excess of blasts, unspecified: Secondary | ICD-10-CM | POA: Insufficient documentation

## 2020-04-29 DIAGNOSIS — R600 Localized edema: Secondary | ICD-10-CM | POA: Insufficient documentation

## 2020-04-29 LAB — COMPREHENSIVE METABOLIC PANEL
ALT: 14 U/L (ref 0–44)
AST: 20 U/L (ref 15–41)
Albumin: 4.3 g/dL (ref 3.5–5.0)
Alkaline Phosphatase: 43 U/L (ref 38–126)
Anion gap: 7 (ref 5–15)
BUN: 19 mg/dL (ref 8–23)
CO2: 27 mmol/L (ref 22–32)
Calcium: 9.1 mg/dL (ref 8.9–10.3)
Chloride: 104 mmol/L (ref 98–111)
Creatinine, Ser: 0.76 mg/dL (ref 0.44–1.00)
GFR calc Af Amer: >60 mL/min (ref >60)
GFR calc non Af Amer: >60 mL/min (ref >60)
Glucose, Bld: 132 mg/dL — ABNORMAL HIGH (ref 70–99)
Potassium: 3.9 mmol/L (ref 3.5–5.1)
Sodium: 138 mmol/L (ref 135–145)
Total Bilirubin: 1.3 mg/dL — ABNORMAL HIGH (ref 0.3–1.2)
Total Protein: 7.9 g/dL (ref 6.5–8.1)

## 2020-04-29 LAB — CBC WITH DIFFERENTIAL/PLATELET
Abs Immature Granulocytes: 0.42 10*3/uL — ABNORMAL HIGH (ref 0.00–0.07)
Basophils Absolute: 0.1 10*3/uL (ref 0.0–0.1)
Basophils Relative: 1 %
Eosinophils Absolute: 0.1 10*3/uL (ref 0.0–0.5)
Eosinophils Relative: 1 %
HCT: 27.5 % — ABNORMAL LOW (ref 36.0–46.0)
Hemoglobin: 8.8 g/dL — ABNORMAL LOW (ref 12.0–15.0)
Immature Granulocytes: 7 %
Lymphocytes Relative: 22 %
Lymphs Abs: 1.4 10*3/uL (ref 0.7–4.0)
MCH: 31.5 pg (ref 26.0–34.0)
MCHC: 32 g/dL (ref 30.0–36.0)
MCV: 98.6 fL (ref 80.0–100.0)
Monocytes Absolute: 1 10*3/uL (ref 0.1–1.0)
Monocytes Relative: 15 %
Neutro Abs: 3.4 10*3/uL (ref 1.7–7.7)
Neutrophils Relative %: 54 %
Platelets: 168 10*3/uL (ref 150–400)
RBC: 2.79 MIL/uL — ABNORMAL LOW (ref 3.87–5.11)
RDW: 29.4 % — ABNORMAL HIGH (ref 11.5–15.5)
Smear Review: ADEQUATE
WBC: 6.2 10*3/uL (ref 4.0–10.5)
nRBC: 1.5 % — ABNORMAL HIGH (ref 0.0–0.2)

## 2020-04-29 LAB — LACTATE DEHYDROGENASE: LDH: 147 U/L (ref 98–192)

## 2020-04-29 LAB — SAMPLE TO BLOOD BANK

## 2020-04-29 MED ORDER — DARBEPOETIN ALFA 500 MCG/ML IJ SOSY
500.0000 ug | PREFILLED_SYRINGE | Freq: Once | INTRAMUSCULAR | Status: AC
  Administered 2020-04-29: 500 ug via SUBCUTANEOUS
  Filled 2020-04-29: qty 1

## 2020-05-06 ENCOUNTER — Inpatient Hospital Stay (HOSPITAL_BASED_OUTPATIENT_CLINIC_OR_DEPARTMENT_OTHER): Payer: PPO | Admitting: Internal Medicine

## 2020-05-06 ENCOUNTER — Other Ambulatory Visit: Payer: Self-pay | Admitting: *Deleted

## 2020-05-06 ENCOUNTER — Inpatient Hospital Stay: Payer: PPO

## 2020-05-06 ENCOUNTER — Other Ambulatory Visit: Payer: Self-pay

## 2020-05-06 DIAGNOSIS — D4620 Refractory anemia with excess of blasts, unspecified: Secondary | ICD-10-CM

## 2020-05-06 DIAGNOSIS — D649 Anemia, unspecified: Secondary | ICD-10-CM

## 2020-05-06 LAB — BASIC METABOLIC PANEL
Anion gap: 9 (ref 5–15)
BUN: 16 mg/dL (ref 8–23)
CO2: 27 mmol/L (ref 22–32)
Calcium: 8.8 mg/dL — ABNORMAL LOW (ref 8.9–10.3)
Chloride: 104 mmol/L (ref 98–111)
Creatinine, Ser: 0.95 mg/dL (ref 0.44–1.00)
GFR calc Af Amer: >60 mL/min (ref >60)
GFR calc non Af Amer: >60 mL/min (ref >60)
Glucose, Bld: 115 mg/dL — ABNORMAL HIGH (ref 70–99)
Potassium: 4.1 mmol/L (ref 3.5–5.1)
Sodium: 140 mmol/L (ref 135–145)

## 2020-05-06 LAB — CBC WITH DIFFERENTIAL/PLATELET
Abs Immature Granulocytes: 0.42 10*3/uL — ABNORMAL HIGH (ref 0.00–0.07)
Basophils Absolute: 0 10*3/uL (ref 0.0–0.1)
Basophils Relative: 1 %
Eosinophils Absolute: 0.2 10*3/uL (ref 0.0–0.5)
Eosinophils Relative: 3 %
HCT: 25.2 % — ABNORMAL LOW (ref 36.0–46.0)
Hemoglobin: 8.2 g/dL — ABNORMAL LOW (ref 12.0–15.0)
Immature Granulocytes: 7 %
Lymphocytes Relative: 21 %
Lymphs Abs: 1.3 10*3/uL (ref 0.7–4.0)
MCH: 31.7 pg (ref 26.0–34.0)
MCHC: 32.5 g/dL (ref 30.0–36.0)
MCV: 97.3 fL (ref 80.0–100.0)
Monocytes Absolute: 1.2 10*3/uL — ABNORMAL HIGH (ref 0.1–1.0)
Monocytes Relative: 19 %
Neutro Abs: 2.9 10*3/uL (ref 1.7–7.7)
Neutrophils Relative %: 49 %
Platelets: 208 10*3/uL (ref 150–400)
RBC: 2.59 MIL/uL — ABNORMAL LOW (ref 3.87–5.11)
RDW: 29.3 % — ABNORMAL HIGH (ref 11.5–15.5)
WBC: 5.9 10*3/uL (ref 4.0–10.5)
nRBC: 1 % — ABNORMAL HIGH (ref 0.0–0.2)

## 2020-05-06 LAB — LACTATE DEHYDROGENASE: LDH: 132 U/L (ref 98–192)

## 2020-05-06 LAB — SAMPLE TO BLOOD BANK

## 2020-05-06 MED ORDER — DARBEPOETIN ALFA 500 MCG/ML IJ SOSY
500.0000 ug | PREFILLED_SYRINGE | Freq: Once | INTRAMUSCULAR | Status: AC
  Administered 2020-05-06: 500 ug via SUBCUTANEOUS
  Filled 2020-05-06: qty 1

## 2020-05-06 NOTE — Progress Notes (Signed)
[facility] CONSULT NOTE  Patient Care Team: [name], [name], MD as PCP - General Rogue Bussing, [name], MD as Consulting Physician (Hematology and Oncology)  CHIEF COMPLAINTS/PURPOSE OF CONSULTATION: Anemia   Oncology History Overview Note   # July 2020-myelodysplastic syndrome with ringed sideroblasts-Normal karyotype; no blasts [IPSS R-Very low risk ~median survival 8.3 years]; iron studies N35 folic acid myeloma panel normal; pyridoxine levels/copper/zinc-WNL. Erythropoietin levels-60. 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  # colonoscopy- 2016 [Dr.Bucinni];  DIAGNOSIS: MDS/low-grade with ringed sideroblasts  STAGE: Low       ;  GOALS: Control  CURRENT/MOST RECENT THERAPY : EPO agent+ REV    MDS (myelodysplastic syndrome), low grade ([facility]  04/23/2019 Initial Diagnosis   MDS (myelodysplastic syndrome), low grade ([facility])      HISTORY OF PRESENTING ILLNESS:  Carla ContiMay 69 y.o.  female history of low-grade MDS anemia with ring sideroblasts currently on Aranesp/Revlimid is here for follow-up.  Patient continues with hard.  Short of breath on exertion.  No blood in stools or black or stools.  Patient notes to have diarrhea x2 episodes [up with 3-4 loose stools]; resolved with conservative measures.  Also noted to have intermittent itch in the scalp.  Again currently improved.  Review of Systems  Constitutional: Positive for malaise/fatigue. Negative for chills, diaphoresis and fever.  HENT: Negative for nosebleeds and sore throat.   Eyes: Negative for double vision.  Respiratory: Negative for cough, hemoptysis, sputum production, shortness  of breath and wheezing.   Cardiovascular: Positive for leg swelling. Negative for chest pain and orthopnea.  Gastrointestinal: Negative for abdominal pain, blood in stool, constipation, diarrhea, heartburn, melena and vomiting.  Genitourinary: Negative for dysuria, frequency and urgency.  Musculoskeletal: Positive for back pain and joint pain.  Skin: Negative.  Negative for itching and rash.  Neurological: Negative for dizziness, tingling, focal weakness, weakness and headaches.  Endo/Heme/Allergies: Does not bruise/bleed easily.  Psychiatric/Behavioral: Negative for depression. The patient is not nervous/anxious and does not have insomnia.     MEDICAL HISTORY:  Past Medical History:  Diagnosis Date  . Allergic rhinitis, cause unspecified   . Anemia, unspecified   . Carpal tunnel syndrome   . Cystitis, unspecified   . Family history of osteoporosis   . PONV (postoperative nausea and vomiting)   . Postnasal drip   . Syncope and collapse     SURGICAL HISTORY: Past Surgical History:  Procedure Laterality Date  . CARPAL TUNNEL RELEASE    . COLONOSCOPY  2005  . COLONOSCOPY WITH PROPOFOL N/A 07/31/2015   Procedure: COLONOSCOPY WITH PROPOFOL;  Surgeon: RRonald Lobo MD;  Location: [facility];  Service: Endoscopy;  Laterality: N/A;  . DIAGNOSTIC LAPAROSCOPY     dx lack of pregnancy   . EYE SURGERY     lasik, cataract, prk,yag procedure    SOCIAL HISTORY: Social History   Socioeconomic History  . Marital status: Married    Spouse name: Not on file  . Number of children: Not on file  . Years of education: Not on file  . Highest education level: Not on file  Occupational History  . Not on file  Tobacco Use  . Smoking status: Never Smoker  . Smokeless tobacco: Never Used  Vaping Use  . Vaping Use: Never  used  Substance and Sexual Activity  . Alcohol use: Yes    Alcohol/week: 0.0 standard drinks    Comment: Rare  . Drug use: No  . Sexual activity: Not Currently  Other  Topics Concern  . Not on file  Social History Narrative   No regular exercise      Drinks lots of [employer]         Social Determinants of Health   Financial Resource Strain: Low Risk   . Difficulty of Paying Living Expenses: Not hard at all  Food Insecurity: No Food Insecurity  . Worried About Charity fundraiser in the Last Year: Never true  . Ran Out of Food in the Last Year: Never true  Transportation Needs: No Transportation Needs  . Lack of Transportation (Medical): No  . Lack of Transportation (Non-Medical): No  Physical Activity: Inactive  . Days of Exercise per Week: 0 days  . Minutes of Exercise per Session: 0 min  Stress: No Stress Concern Present  . Feeling of Stress : Not at all  Social Connections:   . Frequency of Communication with Friends and Family:   . Frequency of Social Gatherings with Friends and Family:   . Attends Religious Services:   . Active Member of Clubs or Organizations:   . Attends Archivist Meetings:   Marland Kitchen Marital Status:   Intimate Partner Violence: Not At Risk  . Fear of Current or Ex-Partner: No  . Emotionally Abused: No  . Physically Abused: No  . Sexually Abused: No    FAMILY HISTORY: Family History  Problem Relation Age of Onset  . Osteoporosis Mother   . Stroke Mother   . Coronary artery disease Father   . Stroke Father 45  . Diabetes Other        Aunts and uncles  . Breast cancer Paternal Aunt   . Breast cancer Maternal Aunt   . Arthritis/Rheumatoid Child   . Breast cancer Cousin     ALLERGIES:  is allergic to codeine.  MEDICATIONS:  Current Outpatient Medications  Medication Sig Dispense Refill  . aspirin EC 81 MG tablet Take 81 mg by mouth daily. Swallow whole.     Marland Kitchen BIOTIN PO Take 1 tablet by mouth daily.    . Calcium Carb-Cholecalciferol (CALCIUM 600+D3 PO) Take by mouth.    . cetirizine (ZYRTEC) 10 MG tablet Take 10 mg by mouth daily as needed for allergies.     . Darbepoetin Alfa (ARANESP) 500 MCG/ML  SOSY injection Inject 500 mcg into the skin. Every other week    . lenalidomide (REVLIMID) 10 MG capsule Take 1 capsule (10 mg total) by mouth daily. 28 capsule 0  . levothyroxine (SYNTHROID) 25 MCG tablet Take 1 tablet (25 mcg total) by mouth daily before breakfast. 90 tablet 3  . Sulfacetamide Sodium, Acne, 10 % LOTN As directed     No current facility-administered medications for this visit.   Facility-Administered Medications Ordered in Other Visits  Medication Dose Route Frequency Provider Last Rate Last Admin  . Darbepoetin Alfa (ARANESP) injection 500 mcg  500 mcg Subcutaneous Once Charlaine Dalton R, MD          PHYSICAL EXAMINATION:   Vitals:   05/06/20 1318  BP: (!) 118/47  Pulse: 74  Resp: 16  Temp: 98.6 F (37 C)  SpO2: 99%   Filed Weights   05/06/20 1318  Weight: 153 lb 12.8 oz (69.8 kg)    Physical Exam Constitutional:  Comments: Appears pale. Walk independently.  Alone.  HENT:     Head: Normocephalic and atraumatic.     Mouth/Throat:     Pharynx: No oropharyngeal exudate.  Eyes:     Pupils: Pupils are equal, round, and reactive to light.  Cardiovascular:     Rate and Rhythm: Normal rate and regular rhythm.     Pulses: Normal pulses.     Heart sounds: Normal heart sounds.  Pulmonary:     Effort: Pulmonary effort is normal. No respiratory distress.     Breath sounds: Normal breath sounds. No wheezing.  Abdominal:     General: Bowel sounds are normal. There is no distension.     Palpations: Abdomen is soft. There is no mass.     Tenderness: There is no abdominal tenderness. There is no guarding or rebound.  Musculoskeletal:        General: No tenderness. Normal range of motion.     Cervical back: Normal range of motion and neck supple.  Skin:    General: Skin is warm.     Coloration: Skin is pale.  Neurological:     Mental Status: She is alert and oriented to person, place, and time.  Psychiatric:        Mood and Affect: Affect normal.      LABORATORY DATA:  I have reviewed the data as listed Lab Results  Component Value Date   WBC 5.9 05/06/2020   HGB 8.2 (L) 05/06/2020   HCT 25.2 (L) 05/06/2020   MCV 97.3 05/06/2020   PLT 208 05/06/2020   Recent Labs    03/05/20 0927 03/05/20 0927 04/15/20 1034 04/29/20 0928 05/06/20 1303  NA 137   < > 140 138 140  K 4.1   < > 4.0 3.9 4.1  CL 104   < > 106 104 104  CO2 27   < > '27 27 27  ' GLUCOSE 97   < > 110* 132* 115*  BUN 15   < > '19 19 16  ' CREATININE 0.84   < > 0.98 0.76 0.95  CALCIUM 9.5   < > 8.9 9.1 8.8*  GFRNONAA  --   --  59* >60 >60  GFRAA  --   --  >60 >60 >60  PROT 7.9  --   --  7.9  --   ALBUMIN 4.5  --   --  4.3  --   AST 15  --   --  20  --   ALT 12  --   --  14  --   ALKPHOS 44  --   --  43  --   BILITOT 0.7  --   --  1.3*  --    < > = values in this interval not displayed.     MM 3D SCREEN BREAST BILATERAL  Result Date: 04/10/2020 CLINICAL DATA:  Screening. EXAM: DIGITAL SCREENING BILATERAL MAMMOGRAM WITH TOMO AND CAD COMPARISON:  Previous exam(s). ACR Breast Density Category b: There are scattered areas of fibroglandular density. FINDINGS: There are no findings suspicious for malignancy. Images were processed with CAD. IMPRESSION: No mammographic evidence of malignancy. A result letter of this screening mammogram will be mailed directly to the patient. RECOMMENDATION: Screening mammogram in one year. (Code:SM-B-01Y) BI-RADS CATEGORY  1: Negative. Electronically Signed   By: Audie Pinto M.D.   On: 04/10/2020 14:11    MDS (myelodysplastic syndrome), low grade ([facility]) # Low grade MDS- with ringed sideroblasts; no blasts. POSITIVE  For SF3B1;  R-IPSS-low risk.  Poor response to Aranesp; Continue Aranesp 500 mcg weekly subcu injections; also currently on lenalidomide 10 mg a day-tolerating fairly well except mild side effects [diarrhea/scalp which see below].  Hemoglobin 8.2; proceed with Aranesp.  Discussed that it [place] take up to 1 to 2 months for  response.  Also discussed availability of Luspatercept if no response noted  # Diarrhea-G-2 x 2 episodes- ? revlimid- currently improved.  Monitor closely.  # Scalp itch- ? revlimid- improved- recommend anti-histamines prn.   # DISPOSITION:  # Aranesp today # in 1 week- cbc;hold tube; possible aranesp #  in 2 week- cbc;-hold tube; possible aranesp # follow up in 3 weeks- MD; labs- cbc/bmp;LDH;HOLD tube;possible; Aranesp SQ- Dr.B  All questions were answered. The patient knows to call the clinic with any problems, questions or concerns.   Carla Sickle, MD 05/06/2020 1:48 PM

## 2020-05-06 NOTE — Assessment & Plan Note (Addendum)
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  Poor response to Aranesp; Continue Aranesp 500 mcg weekly subcu injections; also currently on lenalidomide 10 mg a day-tolerating fairly well except mild side effects [diarrhea/scalp which see below].  Hemoglobin 8.2; proceed with Aranesp.  Discussed that it [place] take up to 1 to 2 months for response.  Also discussed availability of Luspatercept if no response noted  # Diarrhea-G-2 x 2 episodes- ? revlimid- currently improved.  Monitor closely.  # Scalp itch- ? revlimid- improved- recommend anti-histamines prn.   # DISPOSITION:  # Aranesp today # in 1 week- cbc;hold tube; possible aranesp #  in 2 week- cbc;-hold tube; possible aranesp # follow up in 3 weeks- MD; labs- cbc/bmp;LDH;HOLD tube;possible; Aranesp SQ- Dr.B

## 2020-05-12 ENCOUNTER — Other Ambulatory Visit: Payer: Self-pay | Admitting: *Deleted

## 2020-05-12 DIAGNOSIS — D4620 Refractory anemia with excess of blasts, unspecified: Secondary | ICD-10-CM

## 2020-05-12 MED ORDER — LENALIDOMIDE 10 MG PO CAPS: 10.0000 mg | ORAL_CAPSULE | Freq: Every day | ORAL | 0 refills | Status: DC

## 2020-05-13 ENCOUNTER — Other Ambulatory Visit: Payer: Self-pay

## 2020-05-13 ENCOUNTER — Inpatient Hospital Stay: Payer: PPO

## 2020-05-13 ENCOUNTER — Other Ambulatory Visit: Payer: Self-pay | Admitting: *Deleted

## 2020-05-13 VITALS — BP 128/64 | HR 82

## 2020-05-13 DIAGNOSIS — D4620 Refractory anemia with excess of blasts, unspecified: Secondary | ICD-10-CM

## 2020-05-13 DIAGNOSIS — D649 Anemia, unspecified: Secondary | ICD-10-CM

## 2020-05-13 LAB — PREPARE RBC (CROSSMATCH)

## 2020-05-13 LAB — CBC WITH DIFFERENTIAL/PLATELET
Abs Immature Granulocytes: 0.38 10*3/uL — ABNORMAL HIGH (ref 0.00–0.07)
Basophils Absolute: 0 10*3/uL (ref 0.0–0.1)
Basophils Relative: 1 %
Eosinophils Absolute: 0.2 10*3/uL (ref 0.0–0.5)
Eosinophils Relative: 3 %
HCT: 22 % — ABNORMAL LOW (ref 36.0–46.0)
Hemoglobin: 7.1 g/dL — ABNORMAL LOW (ref 12.0–15.0)
Immature Granulocytes: 5 %
Lymphocytes Relative: 17 %
Lymphs Abs: 1.3 10*3/uL (ref 0.7–4.0)
MCH: 32.1 pg (ref 26.0–34.0)
MCHC: 32.3 g/dL (ref 30.0–36.0)
MCV: 99.5 fL (ref 80.0–100.0)
Monocytes Absolute: 1.7 10*3/uL — ABNORMAL HIGH (ref 0.1–1.0)
Monocytes Relative: 22 %
Neutro Abs: 4.2 10*3/uL (ref 1.7–7.7)
Neutrophils Relative %: 52 %
Platelets: 136 10*3/uL — ABNORMAL LOW (ref 150–400)
RBC: 2.21 MIL/uL — ABNORMAL LOW (ref 3.87–5.11)
RDW: 30.1 % — ABNORMAL HIGH (ref 11.5–15.5)
WBC: 7.8 10*3/uL (ref 4.0–10.5)
nRBC: 1.3 % — ABNORMAL HIGH (ref 0.0–0.2)

## 2020-05-13 LAB — SAMPLE TO BLOOD BANK

## 2020-05-13 MED ORDER — DARBEPOETIN ALFA 500 MCG/ML IJ SOSY
500.0000 ug | PREFILLED_SYRINGE | Freq: Once | INTRAMUSCULAR | Status: AC
  Administered 2020-05-13: 500 ug via SUBCUTANEOUS
  Filled 2020-05-13: qty 1

## 2020-05-14 ENCOUNTER — Inpatient Hospital Stay: Payer: PPO

## 2020-05-14 DIAGNOSIS — D46Z Other myelodysplastic syndromes: Secondary | ICD-10-CM

## 2020-05-14 DIAGNOSIS — D649 Anemia, unspecified: Secondary | ICD-10-CM

## 2020-05-14 DIAGNOSIS — D4620 Refractory anemia with excess of blasts, unspecified: Secondary | ICD-10-CM

## 2020-05-14 MED ORDER — DIPHENHYDRAMINE HCL 25 MG PO CAPS
25.0000 mg | ORAL_CAPSULE | Freq: Once | ORAL | Status: AC
  Administered 2020-05-14: 25 mg via ORAL
  Filled 2020-05-14: qty 1

## 2020-05-14 MED ORDER — ACETAMINOPHEN 325 MG PO TABS
650.0000 mg | ORAL_TABLET | Freq: Once | ORAL | Status: AC
  Administered 2020-05-14: 650 mg via ORAL
  Filled 2020-05-14: qty 2

## 2020-05-14 MED ORDER — SODIUM CHLORIDE 0.9% IV SOLUTION
250.0000 mL | Freq: Once | INTRAVENOUS | Status: AC
  Administered 2020-05-14: 250 mL via INTRAVENOUS
  Filled 2020-05-14: qty 250

## 2020-05-15 LAB — TYPE AND SCREEN
ABO/RH(D): B POS
Antibody Screen: NEGATIVE
Unit division: 0

## 2020-05-15 LAB — BPAM RBC
Blood Product Expiration Date: 202108312359
ISSUE DATE / TIME: 202108180910
Unit Type and Rh: 9500

## 2020-05-16 ENCOUNTER — Other Ambulatory Visit: Payer: Self-pay | Admitting: *Deleted

## 2020-05-16 DIAGNOSIS — Z1152 Encounter for screening for COVID-19: Secondary | ICD-10-CM | POA: Diagnosis not present

## 2020-05-16 DIAGNOSIS — J209 Acute bronchitis, unspecified: Secondary | ICD-10-CM

## 2020-05-16 DIAGNOSIS — Z03818 Encounter for observation for suspected exposure to other biological agents ruled out: Secondary | ICD-10-CM | POA: Diagnosis not present

## 2020-05-16 MED ORDER — HYDROCOD POLST-CPM POLST ER 10-8 MG/5ML PO SUER
5.0000 mL | Freq: Two times a day (BID) | ORAL | 0 refills | Status: DC | PRN
Start: 2020-05-16 — End: 2020-06-24

## 2020-05-16 MED ORDER — AZITHROMYCIN 250 MG PO TABS: ORAL_TABLET | ORAL | 0 refills | Status: DC

## 2020-05-16 NOTE — Telephone Encounter (Signed)
Patient called reporting that she believes she has bronchitis. States she has had congestion and drainage for a few weeks and it has now gone down into her chest and she has "irritated feeling in my chest as well as a cough". She is asking if she needs to see her PCP or if we need to see her since she has MDS. She denies fever, cough is productive of clear mucous. She has been taking Guaifenesin. She states she has not been exposed to COVID that she is aware of. Please advise.

## 2020-05-16 NOTE — Telephone Encounter (Signed)
Patient called back. She would like a prescription for Tussinex if possible. Prescription printed. She is still trying to get in touch with [facility] covid testing number. The phone number has a high volume of calls and patient is in the 'waiting que' to get her call answered.  New apt times given as well to patient for Tuesday at 8:30 am

## 2020-05-16 NOTE — Telephone Encounter (Signed)
Per Dr. Rogue Bussing patient needs to be tested for covid today. Md would like to send a prescription for Zpac   Will need to arrange for lab/smc/possible blood transfusion on Monday (pending covid negative and chair availability).

## 2020-05-16 NOTE — Telephone Encounter (Signed)
Contacted patient. Information given to patient to call for covid testing. Patient initially declined covid testing and stated that "I know what is wrong with me and I don't think I need covid testing." Explained to patient that Dr. Rogue Bussing would like to rule out covid first. Patient doesn't want to come in Monday. She prefers Tuesday apts and already has an apt on Tuesday afternoon. I asked the patient if we can move the apts up to the am for lab/smc/possible blood transfusion. She asked why she needed to be scheduled for blood when she just already had a blood transfusion this week. I explained to her that Dr. Rogue Bussing wanted me to schedule her for a tentative blood transfusion.

## 2020-05-17 ENCOUNTER — Other Ambulatory Visit (HOSPITAL_COMMUNITY): Payer: Self-pay

## 2020-05-17 ENCOUNTER — Encounter (HOSPITAL_COMMUNITY): Payer: Self-pay | Admitting: Emergency Medicine

## 2020-05-17 ENCOUNTER — Other Ambulatory Visit: Payer: Self-pay

## 2020-05-17 ENCOUNTER — Emergency Department (HOSPITAL_COMMUNITY): Payer: PPO

## 2020-05-17 ENCOUNTER — Observation Stay (HOSPITAL_COMMUNITY)
Admission: EM | Admit: 2020-05-17 | Discharge: 2020-05-18 | Disposition: A | Discharge status: Home / Self Care | Payer: PPO | Attending: Family Medicine | Admitting: Family Medicine

## 2020-05-17 DIAGNOSIS — E039 Hypothyroidism, unspecified: Secondary | ICD-10-CM | POA: Diagnosis not present

## 2020-05-17 DIAGNOSIS — D469 Myelodysplastic syndrome, unspecified: Secondary | ICD-10-CM | POA: Insufficient documentation

## 2020-05-17 DIAGNOSIS — D696 Thrombocytopenia, unspecified: Secondary | ICD-10-CM | POA: Diagnosis not present

## 2020-05-17 DIAGNOSIS — R079 Chest pain, unspecified: Secondary | ICD-10-CM | POA: Diagnosis not present

## 2020-05-17 DIAGNOSIS — Z20822 Contact with and (suspected) exposure to covid-19: Secondary | ICD-10-CM | POA: Insufficient documentation

## 2020-05-17 DIAGNOSIS — Z7982 Long term (current) use of aspirin: Secondary | ICD-10-CM | POA: Insufficient documentation

## 2020-05-17 DIAGNOSIS — J90 Pleural effusion, not elsewhere classified: Secondary | ICD-10-CM | POA: Diagnosis not present

## 2020-05-17 DIAGNOSIS — J180 Bronchopneumonia, unspecified organism: Secondary | ICD-10-CM | POA: Diagnosis not present

## 2020-05-17 DIAGNOSIS — Z79899 Other long term (current) drug therapy: Secondary | ICD-10-CM | POA: Diagnosis not present

## 2020-05-17 DIAGNOSIS — R0602 Shortness of breath: Principal | ICD-10-CM

## 2020-05-17 DIAGNOSIS — R7989 Other specified abnormal findings of blood chemistry: Secondary | ICD-10-CM

## 2020-05-17 DIAGNOSIS — M6281 Muscle weakness (generalized): Secondary | ICD-10-CM | POA: Diagnosis not present

## 2020-05-17 DIAGNOSIS — D46Z Other myelodysplastic syndromes: Secondary | ICD-10-CM

## 2020-05-17 DIAGNOSIS — R05 Cough: Secondary | ICD-10-CM | POA: Diagnosis not present

## 2020-05-17 DIAGNOSIS — D649 Anemia, unspecified: Secondary | ICD-10-CM | POA: Diagnosis not present

## 2020-05-17 DIAGNOSIS — D4620 Refractory anemia with excess of blasts, unspecified: Secondary | ICD-10-CM

## 2020-05-17 DIAGNOSIS — J189 Pneumonia, unspecified organism: Secondary | ICD-10-CM | POA: Diagnosis not present

## 2020-05-17 LAB — BRAIN NATRIURETIC PEPTIDE: B Natriuretic Peptide: 274.2 pg/mL — ABNORMAL HIGH (ref 0.0–100.0)

## 2020-05-17 LAB — CBC
HCT: 23.2 % — ABNORMAL LOW (ref 36.0–46.0)
Hemoglobin: 7.2 g/dL — ABNORMAL LOW (ref 12.0–15.0)
MCH: 30.9 pg (ref 26.0–34.0)
MCHC: 31 g/dL (ref 30.0–36.0)
MCV: 99.6 fL (ref 80.0–100.0)
Platelets: 84 10*3/uL — ABNORMAL LOW (ref 150–400)
RBC: 2.33 MIL/uL — ABNORMAL LOW (ref 3.87–5.11)
RDW: 28.5 % — ABNORMAL HIGH (ref 11.5–15.5)
WBC: 7.4 10*3/uL (ref 4.0–10.5)
nRBC: 0.5 % — ABNORMAL HIGH (ref 0.0–0.2)

## 2020-05-17 LAB — URINALYSIS, ROUTINE W REFLEX MICROSCOPIC
Bilirubin Urine: NEGATIVE
Glucose, UA: NEGATIVE mg/dL
Hgb urine dipstick: NEGATIVE
Ketones, ur: NEGATIVE mg/dL
Leukocytes,Ua: NEGATIVE
Nitrite: NEGATIVE
Protein, ur: NEGATIVE mg/dL
Specific Gravity, Urine: 1.021 (ref 1.005–1.030)
pH: 6 (ref 5.0–8.0)

## 2020-05-17 LAB — BASIC METABOLIC PANEL
Anion gap: 10 (ref 5–15)
BUN: 13 mg/dL (ref 8–23)
CO2: 22 mmol/L (ref 22–32)
Calcium: 8.7 mg/dL — ABNORMAL LOW (ref 8.9–10.3)
Chloride: 102 mmol/L (ref 98–111)
Creatinine, Ser: 0.89 mg/dL (ref 0.44–1.00)
GFR calc Af Amer: >60 mL/min (ref >60)
GFR calc non Af Amer: >60 mL/min (ref >60)
Glucose, Bld: 146 mg/dL — ABNORMAL HIGH (ref 70–99)
Potassium: 3.6 mmol/L (ref 3.5–5.1)
Sodium: 134 mmol/L — ABNORMAL LOW (ref 135–145)

## 2020-05-17 LAB — TROPONIN I (HIGH SENSITIVITY)
Troponin I (High Sensitivity): 24 ng/L — ABNORMAL HIGH (ref <18)
Troponin I (High Sensitivity): 27 ng/L — ABNORMAL HIGH (ref <18)

## 2020-05-17 LAB — SARS CORONAVIRUS 2 BY RT PCR (HOSPITAL ORDER, PERFORMED IN ~~LOC~~ HOSPITAL LAB): SARS Coronavirus 2: NEGATIVE

## 2020-05-17 LAB — HIV ANTIBODY (ROUTINE TESTING W REFLEX): HIV Screen 4th Generation wRfx: NONREACTIVE

## 2020-05-17 LAB — PREPARE RBC (CROSSMATCH)

## 2020-05-17 LAB — LACTIC ACID, PLASMA: Lactic Acid, Venous: 1.1 mmol/L (ref 0.5–1.9)

## 2020-05-17 LAB — STREP PNEUMONIAE URINARY ANTIGEN: Strep Pneumo Urinary Antigen: NEGATIVE

## 2020-05-17 MED ORDER — SODIUM CHLORIDE 0.9 % IV SOLN
500.0000 mg | INTRAVENOUS | Status: DC
  Administered 2020-05-17: 500 mg via INTRAVENOUS
  Filled 2020-05-17 (×2): qty 500

## 2020-05-17 MED ORDER — SODIUM CHLORIDE 0.9 % IV SOLN
2.0000 g | INTRAVENOUS | Status: DC
  Administered 2020-05-17: 2 g via INTRAVENOUS
  Filled 2020-05-17: qty 20

## 2020-05-17 MED ORDER — LEVOTHYROXINE SODIUM 25 MCG PO TABS
25.0000 ug | ORAL_TABLET | Freq: Every day | ORAL | Status: DC
  Administered 2020-05-18: 25 ug via ORAL
  Filled 2020-05-17: qty 1

## 2020-05-17 MED ORDER — ONDANSETRON HCL 4 MG PO TABS
4.0000 mg | ORAL_TABLET | Freq: Four times a day (QID) | ORAL | Status: DC | PRN
  Administered 2020-05-17: 4 mg via ORAL
  Filled 2020-05-17: qty 1

## 2020-05-17 MED ORDER — ACETAMINOPHEN 325 MG PO TABS
650.0000 mg | ORAL_TABLET | Freq: Four times a day (QID) | ORAL | Status: DC | PRN
  Filled 2020-05-17: qty 2

## 2020-05-17 MED ORDER — ONDANSETRON 4 MG PO TBDP
4.0000 mg | ORAL_TABLET | Freq: Once | ORAL | Status: AC | PRN
  Administered 2020-05-17: 4 mg via ORAL
  Filled 2020-05-17: qty 1

## 2020-05-17 MED ORDER — LACTATED RINGERS IV SOLN: INTRAVENOUS | Status: AC

## 2020-05-17 MED ORDER — ONDANSETRON HCL 4 MG/2ML IJ SOLN
4.0000 mg | Freq: Four times a day (QID) | INTRAMUSCULAR | Status: DC | PRN
  Administered 2020-05-18: 4 mg via INTRAVENOUS
  Filled 2020-05-17: qty 2

## 2020-05-17 MED ORDER — IOHEXOL 350 MG/ML SOLN
50.0000 mL | Freq: Once | INTRAVENOUS | Status: AC | PRN
  Administered 2020-05-17: 50 mL via INTRAVENOUS

## 2020-05-17 MED ORDER — SODIUM CHLORIDE 0.9% IV SOLUTION: Freq: Once | INTRAVENOUS | Status: DC

## 2020-05-17 MED ORDER — ALBUTEROL SULFATE HFA 108 (90 BASE) MCG/ACT IN AERS
4.0000 | INHALATION_SPRAY | Freq: Once | RESPIRATORY_TRACT | Status: AC
  Administered 2020-05-17: 4 via RESPIRATORY_TRACT
  Filled 2020-05-17: qty 6.7

## 2020-05-17 MED ORDER — ACETAMINOPHEN 650 MG RE SUPP: 650.0000 mg | Freq: Four times a day (QID) | RECTAL | Status: DC | PRN

## 2020-05-17 MED ORDER — ACETAMINOPHEN 325 MG PO TABS
650.0000 mg | ORAL_TABLET | Freq: Once | ORAL | Status: AC
  Administered 2020-05-17: 650 mg via ORAL
  Filled 2020-05-17: qty 2

## 2020-05-17 MED ORDER — LACTATED RINGERS IV SOLN: INTRAVENOUS | Status: DC

## 2020-05-17 MED ORDER — SODIUM CHLORIDE 0.9 % IV BOLUS (SEPSIS)
250.0000 mL | Freq: Once | INTRAVENOUS | Status: AC
  Administered 2020-05-17: 250 mL via INTRAVENOUS

## 2020-05-17 NOTE — ED Notes (Signed)
Attempted to give report to 5N.

## 2020-05-17 NOTE — ED Provider Notes (Signed)
[facility] EMERGENCY DEPARTMENT Provider Note   CSN: [id number] Arrival date & time: 05/17/20  1015     History Chief Complaint  Patient presents with  . Chest Pain  . Shortness of Breath    Carla [name] is a 69 y.o. female.  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  Carla [name] states Carla [name] has a history of bronchitis, but has not had bronchitis in quite some time.  States Carla [name] has shortness of breath at baseline, but has been having worsening shortness of breath over the last 24 hours.  Carla [name] was called in a Z-Pak by her PCP which Carla [name] has been compliant with.  Continues to feel short of breath, endorses pain on inspiration. Carla [name] notes some swelling to her lower extremities. No history of heart failure. Per chart review, patient had a blood transfusion done on 05/14/2020 given her history of anemia.  Denies any nausea or vomiting.  Carla [name] is fully vaccinated for Covid.  Denies any measurable fevers, but does endorse chills.  No headache, back pain, dysuria.     Past Medical History:  Diagnosis Date  . Allergic rhinitis, cause unspecified   . Anemia, unspecified   . Carpal tunnel syndrome   . Cystitis, unspecified   . Family history of osteoporosis   . PONV (postoperative nausea and vomiting)   . Postnasal drip   . Syncope and collapse     Patient Active Problem List   Diagnosis Date Noted  . Bronchopneumonia 05/17/2020  . Thrombocytopenia ([place]) 05/17/2020  . Goals of care, counseling/discussion 04/15/2020  . 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 Elevated ferritin 12/22/2017  . Hypothyroidism 12/20/2017  . Blood glucose elevated 12/20/2017  . Normocytic anemia 12/19/2017  . Need for hepatitis C screening test 11/25/2016  . Initial Medicare annual wellness visit 10/10/2015  . Estrogen  deficiency 10/10/2015  . Encounter for routine gynecological examination 10/08/2014  . Other screening mammogram 07/05/2012  . Routine gynecological examination 06/22/2011  . Routine general medical examination at a health care facility 06/15/2011  . ALLERGIC RHINITIS 06/26/2008    Past Surgical History:  Procedure Laterality Date  . CARPAL TUNNEL RELEASE    . COLONOSCOPY  2005  . COLONOSCOPY WITH PROPOFOL N/A 07/31/2015   Procedure: COLONOSCOPY WITH PROPOFOL;  Surgeon: Ronald Lobo, MD;  Location: [facility];  Service: Endoscopy;  Laterality: N/A;  . DIAGNOSTIC LAPAROSCOPY     dx lack of pregnancy   . EYE SURGERY     lasik, cataract, prk,yag procedure     OB History   No obstetric history on file.     Family History  Problem Relation Age of Onset  . Osteoporosis Mother   . Stroke Mother   . Coronary artery disease Father   . Stroke Father 92  . Diabetes Other        Aunts and uncles  . Breast cancer Paternal Aunt   . Breast cancer Maternal Aunt   . Arthritis/Rheumatoid Child   . Breast cancer Cousin     Social History   Tobacco Use  . Smoking status: Never Smoker  . Smokeless tobacco: Never Used  Vaping Use  . Vaping Use: Never used  Substance Use Topics  . Alcohol use: Yes    Alcohol/week: 0.0 standard drinks    Comment: Rare  . Drug use: No    Home  Medications Prior to Admission medications   Medication Sig Start Date End Date Taking? Authorizing Provider  aspirin EC 81 MG tablet Take 81 mg by mouth daily. Swallow whole.     [provider]  azithromycin (ZITHROMAX Z-PAK) 250 MG tablet 2 tablets of 250 mg the first day and 250 mg for the remaining four days. 05/16/20   Cammie Sickle, MD  BIOTIN PO Take 1 tablet by mouth daily.    [provider]  Calcium Carb-Cholecalciferol (CALCIUM 600+D3 PO) Take by mouth.    [provider]  cetirizine (ZYRTEC) 10 MG tablet Take 10 mg by mouth daily as needed for allergies.      [provider]  chlorpheniramine-HYDROcodone (TUSSIONEX) 10-8 MG/5ML SUER Take 5 mLs by mouth every 12 (twelve) hours as needed for cough (prn cough). 05/16/20   Cammie Sickle, MD  Darbepoetin Alfa (ARANESP) 500 MCG/ML SOSY injection Inject 500 mcg into the skin. Every other week    [provider]  lenalidomide (REVLIMID) 10 MG capsule Take 1 capsule (10 mg total) by mouth daily. 05/12/20   Cammie Sickle, MD  levothyroxine (SYNTHROID) 25 MCG tablet Take 1 tablet (25 mcg total) by mouth daily before breakfast. 03/10/20   [name], [name], MD  Sulfacetamide Sodium, Acne, 10 % LOTN As directed 10/11/19   [provider]    Allergies    Codeine  Review of Systems   Review of Systems  Constitutional: Positive for activity change, appetite change, chills and fatigue. Negative for fever.  HENT: Negative for ear pain and sore throat.   Eyes: Negative for pain and visual disturbance.  Respiratory: Positive for cough and shortness of breath.   Cardiovascular: Positive for chest pain. Negative for palpitations.  Gastrointestinal: Negative for abdominal pain, nausea and vomiting.  Genitourinary: Negative for dysuria and hematuria.  Musculoskeletal: Positive for myalgias. Negative for arthralgias and back pain.  Skin: Negative for color change and rash.  Neurological: Positive for weakness. Negative for seizures, syncope and headaches.  All other systems reviewed and are negative.   Physical Exam Updated Vital Signs BP (!) 105/44   Pulse 97   Temp 100.3 F (37.9 C) (Oral)   Resp (!) 22   Ht 5' 4.25" (1.632 m)   Wt 69.4 kg   SpO2 94%   BMI 26.06 kg/m   Physical Exam Vitals and nursing note reviewed.  Constitutional:      General: Carla [name] is not in acute distress.    Appearance: Carla [name] is well-developed. Carla [name] is ill-appearing. Carla [name] is not toxic-appearing or diaphoretic.  HENT:     Head: Normocephalic and atraumatic.  Eyes:     Conjunctiva/sclera:  Conjunctivae normal.  Cardiovascular:     Rate and Rhythm: Normal rate and regular rhythm.     Pulses:          Radial pulses are 2+ on the right side and 2+ on the left side.     Heart sounds: Normal heart sounds. No murmur heard.   Pulmonary:     Effort: Tachypnea present. No accessory muscle usage or respiratory distress.     Breath sounds: Normal breath sounds.  Abdominal:     Palpations: Abdomen is soft.     Tenderness: There is no abdominal tenderness.  Musculoskeletal:     Cervical back: Normal range of motion and neck supple.     Right lower leg: No tenderness. Edema (1+) present.     Left lower leg: No tenderness. Edema (1+)  present.  Skin:    General: Skin is warm and dry.     Capillary Refill: Capillary refill takes less than 2 seconds.     Findings: No erythema or rash.  Neurological:     General: No focal deficit present.     Mental Status: Carla [name] is alert and oriented to person, place, and time.  Psychiatric:        Mood and Affect: Mood normal.        Behavior: Behavior normal.     ED Results / Procedures / Treatments   Labs (all labs ordered are listed, but only abnormal results are displayed) Labs Reviewed  BASIC METABOLIC PANEL - Abnormal; Notable for the following components:      Result Value   Sodium 134 (*)    Glucose, Bld 146 (*)    Calcium 8.7 (*)    All other components within normal limits  CBC - Abnormal; Notable for the following components:   RBC 2.33 (*)    Hemoglobin 7.2 (*)    HCT 23.2 (*)    RDW 28.5 (*)    Platelets 84 (*)    nRBC 0.5 (*)    All other components within normal limits  BRAIN NATRIURETIC PEPTIDE - Abnormal; Notable for the following components:   B Natriuretic Peptide 274.2 (*)    All other components within normal limits  TROPONIN I (HIGH SENSITIVITY) - Abnormal; Notable for the following components:   Troponin I (High Sensitivity) 24 (*)    All other components within normal limits  TROPONIN I (HIGH SENSITIVITY) -  Abnormal; Notable for the following components:   Troponin I (High Sensitivity) 27 (*)    All other components within normal limits  SARS CORONAVIRUS 2 BY RT PCR (HOSPITAL ORDER, [facility])  CULTURE, BLOOD (SINGLE)  CULTURE, BLOOD (ROUTINE X 2)  CULTURE, BLOOD (ROUTINE X 2)  LACTIC ACID, PLASMA  URINALYSIS, ROUTINE W REFLEX MICROSCOPIC    EKG EKG Interpretation  Date/Time:  Saturday May 17 2020 10:24:09 EDT Ventricular Rate:  91 PR Interval:  146 QRS Duration: 78 QT Interval:  350 QTC Calculation: 430 R Axis:   80 Text Interpretation: Normal sinus rhythm Normal ECG When comapred to prior, more artifact but no other significant changes seen. No STEMI Confirmed by Antony Blackbird [phone]) on 05/17/2020 4:18:50 PM   Radiology DG Chest 2 View  Result Date: 05/17/2020 CLINICAL DATA:  69 year old female with history of chest pain and shortness of breath. Dry cough for a few days. EXAM: CHEST - 2 VIEW COMPARISON:  No priors. FINDINGS: Areas of interstitial prominence and some ill-defined opacities are noted in the lower lungs bilaterally, concerning for bronchitis and possible developing multilobar bronchopneumonia. Small bilateral pleural effusions. No evidence of pulmonary edema. No pneumothorax. Heart size is normal. Upper mediastinal contours are within normal limits. IMPRESSION: 1. The appearance the chest suggests bronchitis with developing multilobar bronchopneumonia. 2. Small bilateral pleural effusions. Electronically Signed   By: Vinnie Langton M.D.   On: 05/17/2020 11:21   CT Angio Chest PE W and/or Wo Contrast  Result Date: 05/17/2020 CLINICAL DATA:  Shortness of breath and cough with chest pain. EXAM: CT ANGIOGRAPHY CHEST WITH CONTRAST TECHNIQUE: Multidetector CT imaging of the chest was performed using the standard protocol during bolus administration of intravenous contrast. Multiplanar CT image reconstructions and MIPs were obtained to evaluate  the vascular anatomy. CONTRAST:  29mL OMNIPAQUE IOHEXOL 350 MG/ML SOLN COMPARISON:  None. FINDINGS: Cardiovascular: Satisfactory opacification  of the pulmonary arteries to the segmental level. No evidence of pulmonary embolism. Normal heart size. No pericardial effusion. Mediastinum/Nodes: There is mild right hilar lymphadenopathy. Thyroid gland, trachea, and esophagus demonstrate no significant findings. Lungs/Pleura: Mild linear scarring and/or atelectasis is seen within the inferior aspect of the left upper lobe and posterior aspect of the bilateral lower lobes. A trace amount of pleural fluid is seen, bilaterally. There is no evidence of pneumothorax. Upper Abdomen: No acute abnormality. Musculoskeletal: No chest wall abnormality. No acute or significant osseous findings. Review of the MIP images confirms the above findings. IMPRESSION: 1. No evidence of pulmonary embolism. 2. Trace amount of pleural fluid, bilaterally. 3. Mild linear scarring and/or atelectasis within the inferior aspect of the left upper lobe and posterior aspect of the bilateral lower lobes. Electronically Signed   By: Virgina Norfolk M.D.   On: 05/17/2020 16:41    Procedures Procedures (including critical care time) CRITICAL CARE Performed by: Polo Riley   Total critical care time: 40 minutes  Critical care time was exclusive of separately billable procedures and treating other patients.  Critical care was necessary to treat or prevent imminent or life-threatening deterioration.  Critical care was time spent personally by me on the following activities: development of treatment plan with patient and/or surrogate as well as nursing, discussions with consultants, evaluation of patient's response to treatment, examination of patient, obtaining history from patient or surrogate, ordering and performing treatments and interventions, ordering and review of laboratory studies, ordering and review of radiographic studies, pulse  oximetry and re-evaluation of patient's condition.  Medications Ordered in ED Medications  lactated ringers infusion ( Intravenous New Bag/Given 05/17/20 1758)  cefTRIAXone (ROCEPHIN) 2 g in sodium chloride 0.9 % 100 mL IVPB (0 g Intravenous Stopped 05/17/20 1733)  azithromycin (ZITHROMAX) 500 mg in sodium chloride 0.9 % 250 mL IVPB (0 mg Intravenous Stopped 05/17/20 1913)  ondansetron (ZOFRAN-ODT) disintegrating tablet 4 mg (4 mg Oral Given 05/17/20 1229)  albuterol (VENTOLIN HFA) 108 (90 Base) MCG/ACT inhaler 4 puff (4 puffs Inhalation Given 05/17/20 1613)  acetaminophen (TYLENOL) tablet 650 mg (650 mg Oral Given 05/17/20 1613)  iohexol (OMNIPAQUE) 350 MG/ML injection 50 mL (50 mLs Intravenous Contrast Given 05/17/20 1631)  sodium chloride 0.9 % bolus 250 mL (0 mLs Intravenous Stopped 05/17/20 1751)    ED Course  I have reviewed the triage vital signs and the nursing notes.  Pertinent labs & imaging results that were available during my care of the patient were reviewed by me and considered in my medical decision making (see chart for details).    MDM Rules/Calculators/A&P                         69 year old female with a history of myelodysplastic syndrome currently on chemotherapy with worsening cough, shortness of breath and chest pain x2 days On presentation, Carla [name] is ill-appearing, with some tachypnea, however still speaking full sentences with no accessory muscle usage. Physical exam with clear lung sounds, soft nontender abdomen, with 1+ pitting edema to her lower extremities bilaterally. Vitals on arrival with a blood pressure of 136/58, however began to become progressively hypotensive with blood pressures as low as 105/52. Carla [name] also developed a a borderline fever of 100.3. With this, code sepsis was initiated and evolving sepsis order set was used.  DDX includes: Covid, pneumonia, PE, new onset CHF, anemia   I personally reviewed and interpreted her lab work BMP with mild hyponatremia of  134, no other significant electrolyte abnormalities. Glucose of 146. Normal renal function. Normal anion gap. Mild hypocalcemia of 8.7 CBC without leukocytosis, hemoglobin of 7.2 which appears to be stable. Has not significantly increased since her infusion 4 days ago. Delta troponin elevated but stable at 24 and 27 respectively. BNP elevated at 274.2. Covid test is negative here. Initial lactate of 1.1. Blood cultures and urine pending.   EKG of normal sinus rhythm, slightly more artifact from previous but no other acute changes.  IMAGING:  Chest xray consistent with bronchopneumonia Given patient's cancer history and SOB, CT PE study was ordered which was negative for PE, otherwise some pleural fluid noted bilaterally.  MDM:  Patient w/ complaints of shortness of breath, cough, lower extremity swelling. Code sepsis was initiated given patient's vitals on presentation. Carla [name] was started on antibiotics for CAP given negative COVID test and evidence of bronchopneumonia on chest x-ray. Pt's hemoglobin stable, doubt that this would be contributing to her shortness of breath. CT PE study negative. BNP mildly elevated, however the patient likely need a echo to further evaluate for possible new onset CHF. Patient was started on LR infusion. Dr. Maryan Rued spoke with Dr. Posey Pronto with the hospitalist group, who will admit the patient for further evaluation and work-up.  DISPOSITION: Admission to hospital for worsening SOB in the setting of bronchopneumonia and possible new onset CHF  Patient was seen and evaluated by Dr. Maryan Rued who is agreeable to the above plan and disposition.   Final Clinical Impression(s) / ED Diagnoses Final diagnoses:  Bronchopneumonia  Shortness of breath  Anemia, unspecified type  Elevated brain natriuretic peptide (BNP) level    Rx / DC Orders ED Discharge Orders    None       Lyndel Safe 05/17/20 [place], MD 05/17/20 2032

## 2020-05-17 NOTE — Progress Notes (Signed)
Notified bedside nurse of need to draw and administer antibiotics, and blood cultures.  Clarified with bedside RN to see if abx was started prior to blood culture drawn. Rocephin was started prior to blood culture drawn.

## 2020-05-17 NOTE — Progress Notes (Signed)
Notified provider and bedside nurse of need to order lactic acid and blood cultures.  Only one blood culture ordered and no lactic acids. Have sent a message to notify ordering provider of this, as well as including the bedside Rn. Provider responded she will add these orders.

## 2020-05-17 NOTE — H&P (Signed)
History and Physical    Carla [name] MRN:[id number] DOB: 02/12/1951 DOA: 05/17/2020  PCP: Abner Greenspan, MD  Patient coming from: Home  I have personally briefly reviewed patient's old medical records in [place]  Chief Complaint: Dyspnea, cough  HPI: Carla [name] is a 69 y.o. 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  Patient reports starting Revlimid for management of MDS on 04/27/2020.  She has been receiving Aranesp infusions as well as PRBC transfusions as needed for anemia.  She says over the last week she has had progressive shortness of breath with pleuritic chest discomfort, frequent nonproductive cough, subjective fevers, chills, diaphoresis, nausea without emesis, and fatigue/lethargy.  She reports intermittent diarrhea since starting Revlimid which is unchanged over the last few days.  She denies any abdominal pain, dysuria, or skin changes.  She did receive transfusion of 1 unit PRBC on 05/14/2020 after outpatient blood work showed a hemoglobin of 7.1.  She says she did not respond as well symptomatically as she had on prior blood transfusion.  She denies any obvious bleeding including epistaxis, hemoptysis, hematemesis, vaginal bleeding, hematuria, hematochezia, or melena.  ED Course:  Initial vitals showed BP 130/64, pulse 84, RR 28, temp 100.3 Fahrenheit, SPO2 96% on room air.  While in the ED, RR increased to 30, pulse 138, with drop in MAP to 58.  Labs notable for WBC 7.4, hemoglobin 7.2, platelets 84,000 (136,000 on 05/13/2020), sodium 134, potassium 3.6, bicarb 22, BUN 13, creatinine 0.89, serum glucose 146, high-sensitivity troponin I 24 > 27, BNP 274.2, lactic acid 1.1.  SARS-CoV-2 PCR is negative.  Blood cultures are ordered and pending.  2 view chest x-ray showed changes suggestive for bronchitis with  developing multilobar bronchopneumonia.  Small bilateral pleural effusions noted.  CTA chest PE study is negative for evidence of PE.  Trace bilateral pleural fluid noted.  Linear scarring seen within the inferior aspect of the left upper lobe and posterior aspect of bilateral lower lobes.  Patient was given 250 ccs NS in order to receive IV ceftriaxone and azithromycin.  The hospitalist service was consulted to admit for further evaluation and management.  Review of Systems: All systems reviewed and are negative except as documented in history of present illness above.   Past Medical History:  Diagnosis Date  . Allergic rhinitis, cause unspecified   . Anemia, unspecified   . Carpal tunnel syndrome   . Cystitis, unspecified   . Family history of osteoporosis   . PONV (postoperative nausea and vomiting)   . Postnasal drip   . Syncope and collapse     Past Surgical History:  Procedure Laterality Date  . CARPAL TUNNEL RELEASE    . COLONOSCOPY  2005  . COLONOSCOPY WITH PROPOFOL N/A 07/31/2015   Procedure: COLONOSCOPY WITH PROPOFOL;  Surgeon: Ronald Lobo, MD;  Location: [facility];  Service: Endoscopy;  Laterality: N/A;  . DIAGNOSTIC LAPAROSCOPY     dx lack of pregnancy   . EYE SURGERY     lasik, cataract, prk,yag procedure    Social History:  reports that she has never smoked. She has never used smokeless tobacco. She reports current alcohol use. She reports that she does not use drugs.  Allergies  Allergen Reactions  . Codeine Nausea Only    Family History  Problem Relation Age of Onset  . Osteoporosis Mother   .  Stroke Mother   . Coronary artery disease Father   . Stroke Father 28  . Diabetes Other        Aunts and uncles  . Breast cancer Paternal Aunt   . Breast cancer Maternal Aunt   . Arthritis/Rheumatoid Child   . Breast cancer Cousin      Prior to Admission medications   Medication Sig Start Date End Date Taking? Authorizing Provider  aspirin EC 81 MG  tablet Take 81 mg by mouth daily. Swallow whole.     [provider]  azithromycin (ZITHROMAX Z-PAK) 250 MG tablet 2 tablets of 250 mg the first day and 250 mg for the remaining four days. 05/16/20   Cammie Sickle, MD  BIOTIN PO Take 1 tablet by mouth daily.    [provider]  Calcium Carb-Cholecalciferol (CALCIUM 600+D3 PO) Take by mouth.    [provider]  cetirizine (ZYRTEC) 10 MG tablet Take 10 mg by mouth daily as needed for allergies.     [provider]  chlorpheniramine-HYDROcodone (TUSSIONEX) 10-8 MG/5ML SUER Take 5 mLs by mouth every 12 (twelve) hours as needed for cough (prn cough). 05/16/20   Cammie Sickle, MD  Darbepoetin Alfa (ARANESP) 500 MCG/ML SOSY injection Inject 500 mcg into the skin. Every other week    [provider]  lenalidomide (REVLIMID) 10 MG capsule Take 1 capsule (10 mg total) by mouth daily. 05/12/20   Cammie Sickle, MD  levothyroxine (SYNTHROID) 25 MCG tablet Take 1 tablet (25 mcg total) by mouth daily before breakfast. 03/10/20   [name], [name], MD  Sulfacetamide Sodium, Acne, 10 % LOTN As directed 10/11/19   [provider]    Physical Exam: Vitals:   05/17/20 1745 05/17/20 1845 05/17/20 1900 05/17/20 1915  BP: (!) 113/55 (!) 105/44 (!) 103/56 104/72  Pulse: (!) 123 97 98 81  Resp: (!) 24 (!) 22 20 (!) 24  Temp:      TempSrc:      SpO2: 94% 94% 95% (!) 88%  Weight:      Height:       Constitutional: Resting in bed with head slightly elevated, appears tired eyes: PERRL, lids and conjunctivae normal ENMT: Mucous membranes are dry. Posterior pharynx clear of any exudate or lesions.Normal dentition.  Neck: normal, supple, no masses. Respiratory: Bibasilar inspiratory crackles. Normal respiratory effort. No accessory muscle use.  Cardiovascular: Regular rate and rhythm, no murmurs / rubs / gallops.  Trace nonpitting bilateral lower extremity edema. 2+ pedal pulses. Abdomen: no  tenderness, no masses palpated. No hepatosplenomegaly. Bowel sounds positive.  Musculoskeletal: no clubbing / cyanosis. No joint deformity upper and lower extremities. Good ROM, no contractures. Normal muscle tone.  Skin: Pale complexion, no rashes, lesions, ulcers. No induration.  Cap refill <2 seconds. Neurologic: CN 2-12 grossly intact. Sensation intact, Strength 5/5 in all 4.  Psychiatric: Normal judgment and insight. Alert and oriented x 3. Normal mood.    Labs on Admission: I have personally reviewed following labs and imaging studies  CBC: Recent Labs  Lab 05/13/20 1328 05/17/20 1040  WBC 7.8 7.4  NEUTROABS 4.2  --   HGB 7.1* 7.2*  HCT 22.0* 23.2*  MCV 99.5 99.6  PLT 136* 84*   Basic Metabolic Panel: Recent Labs  Lab 05/17/20 1040  NA 134*  K 3.6  CL 102  CO2 22  GLUCOSE 146*  BUN 13  CREATININE 0.89  CALCIUM 8.7*   GFR: Estimated Creatinine Clearance: 57.4 mL/min (by  C-G formula based on SCr of 0.89 mg/dL). Liver Function Tests: No results for input(s): AST, ALT, ALKPHOS, BILITOT, PROT, ALBUMIN in the last 168 hours. No results for input(s): LIPASE, AMYLASE in the last 168 hours. No results for input(s): AMMONIA in the last 168 hours. Coagulation Profile: No results for input(s): INR, PROTIME in the last 168 hours. Cardiac Enzymes: No results for input(s): CKTOTAL, CKMB, CKMBINDEX, TROPONINI in the last 168 hours. BNP (last 3 results) No results for input(s): PROBNP in the last 8760 hours. HbA1C: No results for input(s): HGBA1C in the last 72 hours. CBG: No results for input(s): GLUCAP in the last 168 hours. Lipid Profile: No results for input(s): CHOL, HDL, LDLCALC, TRIG, CHOLHDL, LDLDIRECT in the last 72 hours. Thyroid Function Tests: No results for input(s): TSH, T4TOTAL, FREET4, T3FREE, THYROIDAB in the last 72 hours. Anemia Panel: No results for input(s): VITAMINB12, FOLATE, FERRITIN, TIBC, IRON, RETICCTPCT in the last 72 hours. Urine analysis:      Component Value Date/Time   COLORURINE yellow 07/22/2009 1526   APPEARANCEUR Cloudy 07/22/2009 1526   LABSPEC >=1.030 07/22/2009 1526   PHURINE 6.0 07/22/2009 1526   HGBUR large 07/22/2009 1526   BILIRUBINUR negative 07/22/2009 1526   UROBILINOGEN 0.2 07/22/2009 1526   NITRITE negative 07/22/2009 1526    Radiological Exams on Admission: DG Chest 2 View  Result Date: 05/17/2020 CLINICAL DATA:  69 year old female with history of chest pain and shortness of breath. Dry cough for a few days. EXAM: CHEST - 2 VIEW COMPARISON:  No priors. FINDINGS: Areas of interstitial prominence and some ill-defined opacities are noted in the lower lungs bilaterally, concerning for bronchitis and possible developing multilobar bronchopneumonia. Small bilateral pleural effusions. No evidence of pulmonary edema. No pneumothorax. Heart size is normal. Upper mediastinal contours are within normal limits. IMPRESSION: 1. The appearance the chest suggests bronchitis with developing multilobar bronchopneumonia. 2. Small bilateral pleural effusions. Electronically Signed   By: Vinnie Langton M.D.   On: 05/17/2020 11:21   CT Angio Chest PE W and/or Wo Contrast  Result Date: 05/17/2020 CLINICAL DATA:  Shortness of breath and cough with chest pain. EXAM: CT ANGIOGRAPHY CHEST WITH CONTRAST TECHNIQUE: Multidetector CT imaging of the chest was performed using the standard protocol during bolus administration of intravenous contrast. Multiplanar CT image reconstructions and MIPs were obtained to evaluate the vascular anatomy. CONTRAST:  51mL OMNIPAQUE IOHEXOL 350 MG/ML SOLN COMPARISON:  None. FINDINGS: Cardiovascular: Satisfactory opacification of the pulmonary arteries to the segmental level. No evidence of pulmonary embolism. Normal heart size. No pericardial effusion. Mediastinum/Nodes: There is mild right hilar lymphadenopathy. Thyroid gland, trachea, and esophagus demonstrate no significant findings. Lungs/Pleura: Mild linear  scarring and/or atelectasis is seen within the inferior aspect of the left upper lobe and posterior aspect of the bilateral lower lobes. A trace amount of pleural fluid is seen, bilaterally. There is no evidence of pneumothorax. Upper Abdomen: No acute abnormality. Musculoskeletal: No chest wall abnormality. No acute or significant osseous findings. Review of the MIP images confirms the above findings. IMPRESSION: 1. No evidence of pulmonary embolism. 2. Trace amount of pleural fluid, bilaterally. 3. Mild linear scarring and/or atelectasis within the inferior aspect of the left upper lobe and posterior aspect of the bilateral lower lobes. Electronically Signed   By: Virgina Norfolk M.D.   On: 05/17/2020 16:41    EKG: Independently reviewed. Normal sinus rhythm without acute ischemic changes.  No prior for comparison.  Assessment/Plan Principal Problem:   Symptomatic anemia Active Problems:  Hypothyroidism   MDS (myelodysplastic syndrome), low grade (HCC)   Bronchopneumonia   Thrombocytopenia (HCC)  Carla [name] is a 69 y.o. 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  Symptomatic anemia: Hemoglobin 7.2, not significantly changed from prior after receiving blood transfusion on 05/14/2020.  No obvious bleeding reported.  In setting of underlying MDS and Revlimid use. -Transfuse 1 unit PRBC -Repeat CBC in a.m.  Bronchopneumonia: Patient with symptoms of bronchitis with possibly early developing bronchopneumonia.  Will continue empiric antibiotics for now. -Continue IV ceftriaxone and azithromycin -Follow blood cultures, strep pneumo urinary antigen -SARS-CoV-2 PCR negative  Myelodysplastic syndrome: Follows with oncology, Dr. Rogue Bussing.  Current therapy with Revlimid and Aranesp. -Hold Revlimid for now  Thrombocytopenia: Platelets 84,000 on  admission compared to 136,000 on 05/13/2020.  Likely multifactorial in the setting of acute illness, MDS, Revlimid use.  Denies any obvious bleeding.  Continue to monitor.  Hypothyroidism: Continue Synthroid.   DVT prophylaxis: SCDs given thrombocytopenia Code Status: Full code, confirmed with patient Family Communication: Discussed with patient's husband at bedside Disposition Plan: From home and likely discharge to home pending symptomatic improvement appropriate response to the blood transfusion Consults called: None Admission status:  Status is: Observation  The patient remains OBS appropriate and will d/c before 2 midnights.  Dispo: The patient is from: Home              Anticipated d/c is to: Home              Anticipated d/c date is: 1 day              Patient currently is not medically stable to d/c.    Zada Finders MD [facility]  If 7PM-7AM, please contact night-coverage [email or web address]  05/17/2020, 8:23 PM

## 2020-05-17 NOTE — ED Notes (Signed)
Pt sitting outside. 

## 2020-05-17 NOTE — ED Triage Notes (Signed)
C/o SOB, cough, and chest pain since yesterday.  States she talked to her oncologist that thought she likely had bronchitis and called her in a Z-pack yesterday.

## 2020-05-18 ENCOUNTER — Telehealth: Payer: Self-pay | Admitting: Oncology

## 2020-05-18 DIAGNOSIS — D649 Anemia, unspecified: Secondary | ICD-10-CM | POA: Diagnosis not present

## 2020-05-18 LAB — VITAMIN B12: Vitamin B-12: 285 pg/mL (ref 180–914)

## 2020-05-18 LAB — BASIC METABOLIC PANEL
Anion gap: 8 (ref 5–15)
BUN: 15 mg/dL (ref 8–23)
CO2: 21 mmol/L — ABNORMAL LOW (ref 22–32)
Calcium: 8.6 mg/dL — ABNORMAL LOW (ref 8.9–10.3)
Chloride: 103 mmol/L (ref 98–111)
Creatinine, Ser: 0.97 mg/dL (ref 0.44–1.00)
GFR calc Af Amer: >60 mL/min (ref >60)
GFR calc non Af Amer: 60 mL/min — ABNORMAL LOW (ref >60)
Glucose, Bld: 171 mg/dL — ABNORMAL HIGH (ref 70–99)
Potassium: 4 mmol/L (ref 3.5–5.1)
Sodium: 132 mmol/L — ABNORMAL LOW (ref 135–145)

## 2020-05-18 LAB — TYPE AND SCREEN
ABO/RH(D): B POS
Antibody Screen: NEGATIVE
Unit division: 0

## 2020-05-18 LAB — RESPIRATORY PANEL BY PCR

## 2020-05-18 LAB — FOLATE: Folate: 19.1 ng/mL (ref >5.9)

## 2020-05-18 LAB — CBC
HCT: 23.8 % — ABNORMAL LOW (ref 36.0–46.0)
Hemoglobin: 7.9 g/dL — ABNORMAL LOW (ref 12.0–15.0)
MCH: 31.6 pg (ref 26.0–34.0)
MCHC: 33.2 g/dL (ref 30.0–36.0)
MCV: 95.2 fL (ref 80.0–100.0)
Platelets: 83 10*3/uL — ABNORMAL LOW (ref 150–400)
RBC: 2.5 MIL/uL — ABNORMAL LOW (ref 3.87–5.11)
RDW: 26.6 % — ABNORMAL HIGH (ref 11.5–15.5)
WBC: 8.5 10*3/uL (ref 4.0–10.5)
nRBC: 0.6 % — ABNORMAL HIGH (ref 0.0–0.2)

## 2020-05-18 LAB — IRON AND TIBC
Iron: 173 ug/dL — ABNORMAL HIGH (ref 28–170)
Saturation Ratios: 90 % — ABNORMAL HIGH (ref 10.4–31.8)
TIBC: 193 ug/dL — ABNORMAL LOW (ref 250–450)
UIBC: 20 ug/dL

## 2020-05-18 LAB — BPAM RBC
Blood Product Expiration Date: 202108272359
ISSUE DATE / TIME: 202108212242
Unit Type and Rh: 5100

## 2020-05-18 LAB — FERRITIN: Ferritin: 545 ng/mL — ABNORMAL HIGH (ref 11–307)

## 2020-05-18 LAB — PROCALCITONIN: Procalcitonin: 0.13 ng/mL

## 2020-05-18 MED ORDER — LENALIDOMIDE 10 MG PO CAPS: 10.0000 mg | ORAL_CAPSULE | Freq: Every day | ORAL | 0 refills | Status: DC

## 2020-05-18 NOTE — Care Management (Signed)
Discussed HH PT options with patient and husband at the bedside.  Patient states she doesn't feel HHPT is necessary and will decline at this time.  Patient states she feels much better than even this morning and looks well.

## 2020-05-18 NOTE — Telephone Encounter (Signed)
Carla [name] called to report that pt has difficulty in breathing and also chest pain. Advise Carla [name] to send patient to ER for evaluation.  Carla [name] voices understanding and will send pt to [name] ER instead of [facility] ER due to personal preference.  Advise patient to go to closest ER.

## 2020-05-18 NOTE — Evaluation (Addendum)
Physical Therapy Evaluation Patient Details Name: Carla [name] MRN: [id number] DOB: 1951-05-17 Today's Date: 05/18/2020   History of Present Illness  Carla [name] is a 69 y.o. 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  Clinical Impression  Patient received sitting on sofa in room , husband present. Patient reports mild sob with mobility. Transfers with supervision, ambulated 250 feet without ad and supervision to min guard. No lob. O2 saturations with ambulation at 94%, HR 113. Patient will be able to return home with support from husband. Home health PT discussed with patient for endurance training and strengthening, and they will decide if they want to do that.       Follow Up Recommendations Home health PT    Equipment Recommendations  None recommended by PT    Recommendations for Other Services       Precautions / Restrictions Precautions Precaution Comments: low fall, droplet precautions Restrictions Weight Bearing Restrictions: No      Mobility  Bed Mobility               General bed mobility comments: patient sitting on sofa in room, bed mobility not assessed  Transfers Overall transfer level: Needs assistance Equipment used: None Transfers: Sit to/from Stand Sit to Stand: Supervision;Modified independent (Device/Increase time)            Ambulation/Gait Ambulation/Gait assistance: Supervision Gait Distance (Feet): 250 Feet Assistive device: None Gait Pattern/deviations: Step-through pattern;Narrow base of support;Decreased stride length Gait velocity: decr   General Gait Details: generally steady with ambulation, some mild sob.  Stairs            Wheelchair Mobility    Modified Rankin (Stroke Patients Only)       Balance Overall balance assessment: Independent                                            Pertinent Vitals/Pain Pain Assessment: No/denies pain    Home Living Family/patient expects to be discharged to:: Private residence Living Arrangements: Spouse/significant other             Home Equipment: None      Prior Function Level of Independence: Independent               Hand Dominance        Extremity/Trunk Assessment   Upper Extremity Assessment Upper Extremity Assessment: Defer to OT evaluation    Lower Extremity Assessment Lower Extremity Assessment: Generalized weakness    Cervical / Trunk Assessment Cervical / Trunk Assessment: Normal  Communication   Communication: No difficulties  Cognition Arousal/Alertness: Awake/alert Behavior During Therapy: WFL for tasks assessed/performed Overall Cognitive Status: Within Functional Limits for tasks assessed                                        General Comments      Exercises     Assessment/Plan    PT Assessment All further PT needs can be met in the next venue of care  PT Problem List Decreased strength;Decreased activity tolerance;Decreased mobility       PT Treatment Interventions      PT Goals (Current goals can be  found in the Care Plan section)  Acute Rehab PT Goals Patient Stated Goal: to return home, get stronger PT Goal Formulation: With patient/family Time For Goal Achievement: 05/21/20 Potential to Achieve Goals: Good    Frequency     Barriers to discharge        Co-evaluation               AM-PAC PT "6 Clicks" Mobility  Outcome Measure Help needed turning from your back to your side while in a flat bed without using bedrails?: None Help needed moving from lying on your back to sitting on the side of a flat bed without using bedrails?: None Help needed moving to and from a bed to a chair (including a wheelchair)?: None Help needed standing up from a chair using your arms (e.g., wheelchair or bedside  chair)?: A Little Help needed to walk in hospital room?: A Little Help needed climbing 3-5 steps with a railing? : A Little 6 Click Score: 21    End of Session Equipment Utilized During Treatment: Gait belt Activity Tolerance: Patient tolerated treatment well Patient left: in chair;with family/visitor present Nurse Communication: Mobility status PT Visit Diagnosis: Muscle weakness (generalized) (M62.81)    Time: 1230-1240 PT Time Calculation (min) (ACUTE ONLY): 10 min   Charges:   PT Evaluation $PT Eval Low Complexity: 1 Low          Calyssa Zobrist, PT, GCS 05/18/20,12:54 PM

## 2020-05-18 NOTE — Progress Notes (Signed)
   Vital Signs   05/18/20 0900  Vitals  Temp 98 F (36.7 C)  Temp Source Oral  BP 113/61  MAP (mmHg) 75  BP Location Right Arm  BP Method Automatic  Patient Position (if appropriate) Lying  Pulse Rate (!) 114  Resp 20  MEWS COLOR  MEWS Score Color Yellow  Oxygen Therapy  SpO2 90 %  O2 Device Room Air   Pt found to have tachypnea, tachycardia, and SOB after walking to the bathroom. Dr. Doristine Bosworth is at bedside currently talking to the patient. Pt not requiring any O2 at this time. Rapid response notified as well as the charge nurse. MEWS protocol started. Will continue to monitor.       Margarite Gouge 05/18/2020,9:30 AM

## 2020-05-18 NOTE — Progress Notes (Signed)
   05/18/20 0900  Assess: MEWS Score  Temp 98 F (36.7 C)  BP 113/61  Pulse Rate (!) 114  Resp 20  SpO2 90 %  O2 Device Room Air  Assess: MEWS Score  MEWS Temp 0  MEWS Systolic 0  MEWS Pulse 2  MEWS RR 0  MEWS LOC 0  MEWS Score 2  MEWS Score Color Yellow  Assess: if the MEWS score is Yellow or Red  Were vital signs taken at a resting state? Yes  Focused Assessment No change from prior assessment  Early Detection of Sepsis Score *See Row Information* Low  MEWS guidelines implemented *See Row Information* Yes  Treat  MEWS Interventions Other (Comment) (cough and deep breathing with patient and I.S. done.)  Pain Scale 0-10  Pain Score 0  Patients response to intervention Relief  Take Vital Signs  Increase Vital Sign Frequency  Yellow: Q 2hr X 2 then Q 4hr X 2, if remains yellow, continue Q 4hrs  Notify: Charge Nurse/RN  Name of Charge Nurse/RN Notified Armed forces training and education officer  Date Charge Nurse/RN Notified 05/18/20  Time Charge Nurse/RN Notified 0900  Notify: Provider  Provider Name/Title Pahwani MD  Date Provider Notified 05/18/20  Time Provider Notified 0900  Notification Type Face-to-face  Notification Reason Other (Comment) (sitting next to me at time MEWS fired)  Response No new orders  Date of Provider Response 05/18/20  Time of Provider Response 0900  Document  Patient Outcome Other (Comment) (no interventions)  Progress note created (see row info) Yes   MD aware and came to bedside to assess patient.No interventions necessary. PCR, PT/OT eval, walk test ordered for patient. Will continue to monitor.

## 2020-05-18 NOTE — Evaluation (Signed)
Occupational Therapy Evaluation Patient Details Name: Carla [name] MRN: [id number] DOB: 03-Jul-1951 Today's Date: 05/18/2020    History of Present Illness Carla [name] is a 69 y.o. 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   Clinical Impression   PTA patient independent and driving. Admitted for above and limited by problem list below, including decreased activity tolerance and generalized weakness. Patient completing in room mobility, transfers and ADLs with modified independence.  Reviewed energy conservation techniques, specifically progressive increase in daily activities, shower recommendations (seated, luke warm water, and decreased steam with fan/opening door), seated LB ADLs, planning out activities, and resting before exhaustion ---patient verbalized understanding of recommendations.  Based on performance today, no further OT needs have been identified and OT will sign off.     Follow Up Recommendations  No OT follow up;Supervision - Intermittent    Equipment Recommendations  None recommended by OT    Recommendations for Other Services       Precautions / Restrictions Precautions Precaution Comments: low fall, droplet precautions Restrictions Weight Bearing Restrictions: No      Mobility Bed Mobility Overal bed mobility: Modified Independent             General bed mobility comments: sidelying in bed, transitioned to sitting with rail and increased time   Transfers Overall transfer level: Needs assistance Equipment used: None Transfers: Sit to/from Stand Sit to Stand: Modified independent (Device/Increase time)         General transfer comment: no assist required    Balance Overall balance assessment: Independent                                         ADL either performed or  assessed with clinical judgement   ADL Overall ADL's : Modified independent                                       General ADL Comments: patient demonstrates ability to complete basic ADLs, mobility in room and transfers with modified independence.      Vision   Vision Assessment?: No apparent visual deficits     Perception     Praxis      Pertinent Vitals/Pain Pain Assessment: No/denies pain     Hand Dominance     Extremity/Trunk Assessment Upper Extremity Assessment Upper Extremity Assessment: Generalized weakness   Lower Extremity Assessment Lower Extremity Assessment: Defer to PT evaluation   Cervical / Trunk Assessment Cervical / Trunk Assessment: Normal   Communication Communication Communication: No difficulties   Cognition Arousal/Alertness: Awake/alert Behavior During Therapy: WFL for tasks assessed/performed Overall Cognitive Status: Within Functional Limits for tasks assessed                                     General Comments  spouse present and supportive; reviewed energy conservation techniques for recovery, progressive increase in daily activities--pt verbalized understanding of recommendations     Exercises     Shoulder Instructions      Home Living Family/patient expects to be discharged to:: Private residence Living Arrangements: Spouse/significant other Available Help at Discharge: Family Type  of Home: House             Bathroom Shower/Tub: Occupational psychologist: Standard     Home Equipment: Carla [name] in          Prior Functioning/Environment Level of Independence: Independent                 OT Problem List: Decreased activity tolerance;Cardiopulmonary status limiting activity      OT Treatment/Interventions:      OT Goals(Current goals can be found in the care plan section) Acute Rehab OT Goals Patient Stated Goal: feel better, get home  OT Goal Formulation:  With patient  OT Frequency:     Barriers to D/C:            Co-evaluation              AM-PAC OT "6 Clicks" Daily Activity     Outcome Measure Help from another person eating meals?: None Help from another person taking care of personal grooming?: None Help from another person toileting, which includes using toliet, bedpan, or urinal?: None Help from another person bathing (including washing, rinsing, drying)?: None Help from another person to put on and taking off regular upper body clothing?: None Help from another person to put on and taking off regular lower body clothing?: None 6 Click Score: 24   End of Session Nurse Communication: Mobility status  Activity Tolerance: Patient tolerated treatment well Patient left: with call bell/phone within reach;with family/visitor present (seated EOB )  OT Visit Diagnosis: Other (comment);Muscle weakness (generalized) (M62.81) (decreased activity tolerance )                Time: 0350-0938 OT Time Calculation (min): 12 min Charges:  OT General Charges $OT Visit: 1 Visit OT Evaluation $OT Eval Low Complexity: 1 Low  Jolaine Artist, OT Acute Rehabilitation Services Pager [phone] Office [phone]   Delight Stare 05/18/2020, 1:07 PM

## 2020-05-18 NOTE — Discharge Instructions (Signed)
Anemia  Anemia is a condition in which you do not have enough red blood cells or hemoglobin. Hemoglobin is a substance in red blood cells that carries oxygen. When you do not have enough red blood cells or hemoglobin (are anemic), your body cannot get enough oxygen and your organs Hott not work properly. As a result, you Vivanco feel very tired or have other problems. What are the causes? Common causes of anemia include:  Excessive bleeding. Anemia can be caused by excessive bleeding inside or outside the body, including bleeding from the intestine or from periods in women.  Poor nutrition.  Long-lasting (chronic) kidney, thyroid, and liver disease.  Bone marrow disorders.  Cancer and treatments for cancer.  HIV (human immunodeficiency virus) and AIDS (acquired immunodeficiency syndrome).  Treatments for HIV and AIDS.  Spleen problems.  Blood disorders.  Infections, medicines, and autoimmune disorders that destroy red blood cells. What are the signs or symptoms? Symptoms of this condition include:  Minor weakness.  Dizziness.  Headache.  Feeling heartbeats that are irregular or faster than normal (palpitations).  Shortness of breath, especially with exercise.  Paleness.  Cold sensitivity.  Indigestion.  Nausea.  Difficulty sleeping.  Difficulty concentrating. Symptoms Rueth occur suddenly or develop slowly. If your anemia is mild, you Gladd not have symptoms. How is this diagnosed? This condition is diagnosed based on:  Blood tests.  Your medical history.  A physical exam.  Bone marrow biopsy. Your health care provider Kindley also check your stool (feces) for blood and Oehlert do additional testing to look for the cause of your bleeding. You Maruyama also have other tests, including:  Imaging tests, such as a CT scan or MRI.  Endoscopy.  Colonoscopy. How is this treated? Treatment for this condition depends on the cause. If you continue to lose a lot of blood, you Plotner  need to be treated at a hospital. Treatment Thurow include:  Taking supplements of iron, vitamin S31, or folic acid.  Taking a hormone medicine (erythropoietin) that can help to stimulate red blood cell growth.  Having a blood transfusion. This Boran be needed if you lose a lot of blood.  Making changes to your diet.  Having surgery to remove your spleen. Follow these instructions at home:  Take over-the-counter and prescription medicines only as told by your health care provider.  Take supplements only as told by your health care provider.  Follow any diet instructions that you were given.  Keep all follow-up visits as told by your health care provider. This is important. Contact a health care provider if:  You develop new bleeding anywhere in the body. Get help right away if:  You are very weak.  You are short of breath.  You have pain in your abdomen or chest.  You are dizzy or feel faint.  You have trouble concentrating.  You have bloody or black, tarry stools.  You vomit repeatedly or you vomit up blood. Summary  Anemia is a condition in which you do not have enough red blood cells or enough of a substance in your red blood cells that carries oxygen (hemoglobin).  Symptoms Otterness occur suddenly or develop slowly.  If your anemia is mild, you Engram not have symptoms.  This condition is diagnosed with blood tests as well as a medical history and physical exam. Other tests Reidinger be needed.  Treatment for this condition depends on the cause of the anemia. This information is not intended to replace advice given to you by  your health care provider. Make sure you discuss any questions you have with your health care provider. Document Revised: 08/26/2017 Document Reviewed: 10/15/2016 Elsevier Patient Education  [place].

## 2020-05-18 NOTE — Discharge Summary (Signed)
Physician Discharge Summary  Carla [name] MRN:[id number] DOB: June 04, 1951 DOA: 05/17/2020  PCP: Abner Greenspan, MD  Admit date: 05/17/2020 Discharge date: 05/18/2020  Admitted From: Home Disposition: Home  Recommendations for Outpatient Follow-up:  1. Follow up with PCP in 1-2 weeks 2. Please obtain BMP/CBC in one week 3. Please follow up with your PCP on the following pending results: Unresulted Labs (From admission, onward)         None       Home Health: Yes Equipment/Devices: None  Discharge Condition: Stable CODE STATUS: Full code Diet recommendation: Cardiac  Subjective: Seen and examined this morning.  Husband at the bedside.  She stated that she still has shortness of breath but felt much better.  Despite of shortness of breath, she was not hypoxic.  She looked weak so she was given the option to either stay overnight or go home as she was not hypoxic but she preferred to go home and her husband also supported her decision.  HPI: Carla [name] is a 69 y.o. 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  Patient reports starting Revlimid for management of MDS on 04/27/2020.  She has been receiving Aranesp infusions as well as PRBC transfusions as needed for anemia.  She says over the last week she has had progressive shortness of breath with pleuritic chest discomfort, frequent nonproductive cough, subjective fevers, chills, diaphoresis, nausea without emesis, and fatigue/lethargy.  She reports intermittent diarrhea since starting Revlimid which is unchanged over the last few days.  She denies any abdominal pain, dysuria, or skin changes.  She did receive transfusion of 1 unit PRBC on 05/14/2020 after outpatient blood work showed a hemoglobin of 7.1.  She says she did not respond as well symptomatically as she had on prior blood  transfusion.  She denies any obvious bleeding including epistaxis, hemoptysis, hematemesis, vaginal bleeding, hematuria, hematochezia, or melena.  ED Course:  Initial vitals showed BP 130/64, pulse 84, RR 28, temp 100.3 Fahrenheit, SPO2 96% on room air.  While in the ED, RR increased to 30, pulse 138, with drop in MAP to 58.  Labs notable for WBC 7.4, hemoglobin 7.2, platelets 84,000 (136,000 on 05/13/2020), sodium 134, potassium 3.6, bicarb 22, BUN 13, creatinine 0.89, serum glucose 146, high-sensitivity troponin I 24 > 27, BNP 274.2, lactic acid 1.1.  SARS-CoV-2 PCR is negative.  Blood cultures are ordered and pending.  2 view chest x-ray showed changes suggestive for bronchitis with developing multilobar bronchopneumonia.  Small bilateral pleural effusions noted.  CTA chest PE study is negative for evidence of PE.  Trace bilateral pleural fluid noted.  Linear scarring seen within the inferior aspect of the left upper lobe and posterior aspect of bilateral lower lobes.  Patient was given 250 ccs NS in order to receive IV ceftriaxone and azithromycin.  The hospitalist service was consulted to admit for further evaluation and management.  Brief/Interim Summary: Patient was admitted under hospitalist service with symptomatic anemia and impression of community-acquired pneumonia for which she was started on IV antibiotics and she received 1 unit of PRBC transfusion.  Her hemoglobin went from 7.2 to 7.9 posttransfusion.  She was continued on IV antibiotics.  When seen this morning, patient still complained of some shortness of breath but stated that she is better than yesterday.  She never was hypoxic and thus never required any oxygen.  Her  husband was at the bedside.  She looked weak but was not dyspneic.  Since she did not require any hypoxia, she was given the option to go home or stay overnight until she feels a stronger however patient chose to go home and in fact requested to discharge her  home.  Per my recommendation and insistence, she stayed until seen by PT OT and they recommended home health.  Her procalcitonin was 0.13, she has no leukocytosis and she is afebrile.  Although chest x-ray showed possibility of developing bronchopneumonia but CT angiogram of the chest did not comment anything about that.  PE were ruled out.  She was ruled out of influenza and respiratory viral panel was also negative.  Based on all of this, I think she likely has some viral bronchitis and not community-acquired bacterial pneumonia and thus I would discharge her without any antibiotics.  Patient in agreement with that decision as well.  We checked her ambulatory oximetry.  Following are the results documented by the nurse in the chart.  "The pt walked 1 lap around the unit on pulse oximetry. The pt sustained an O2 saturation between 90 -91 % on RA, dipping down to 89% O2 on RA 2 times briefly. The pt denied any SOB and displayed no signs of distress while walking. "  I have advised the patient to hold her Revlimid for next few days and resume it on 05/21/2020 if she improves.  She is well aware that she needs to seek medical attention if she were to get sicker from here.  Discharge Diagnoses:  Principal Problem:   Symptomatic anemia Active Problems:   Hypothyroidism   MDS (myelodysplastic syndrome), low grade (HCC)   Bronchopneumonia   [facility]rombocytopenia [facility]    Discharge Instructions  Discharge Instructions    Discharge patient   Complete by: As directed    Discharge disposition: 06-Home-Health Care Svc   Discharge patient date: 05/18/2020     Allergies as of 05/18/2020      Reactions   Codeine Nausea Only      Medication List    TAKE these medications   acetaminophen 500 MG tablet Commonly known as: TYLENOL Take 500-1,000 mg by mouth every 6 (six) hours as needed for mild pain or headache.   aspirin EC 81 MG tablet Take 81 mg by mouth at bedtime. Swallow whole.   azithromycin  250 MG tablet Commonly known as: Zithromax Z-Pak 2 tablets of 250 mg the first day and 250 mg for the remaining four days. What changed:   how much to take  how to take this  when to take this  additional instructions   BIOTIN PO Take 1 tablet by mouth 3 (three) times a week.   CALCIUM 600+D3 PO Take 1 tablet by mouth 3 (three) times a week.   cetirizine 10 MG tablet Commonly known as: ZYRTEC Take 10 mg by mouth daily as needed for allergies.   chlorpheniramine-HYDROcodone 10-8 MG/5ML Suer Commonly known as: TUSSIONEX Take 5 mLs by mouth every 12 (twelve) hours as needed for cough (prn cough).   Darbepoetin Alfa 500 MCG/ML Sosy injection Commonly known as: ARANESP Inject 500 mcg into the skin See admin instructions. Inject 500 mcg into the skin every other week   Delsym 30 MG/5ML liquid Generic drug: dextromethorphan Take 15-30 mg by mouth every 12 (twelve) hours as needed for cough.   lenalidomide 10 MG capsule Commonly known as: REVLIMID Take 1 capsule (10 mg total) by mouth daily. Start  taking on: May 21, 2020 What changed: These instructions start on May 21, 2020. If you are unsure what to do until then, ask your doctor or other care provider.   levothyroxine 25 MCG tablet Commonly known as: SYNTHROID Take 1 tablet (25 mcg total) by mouth daily before breakfast.   sodium chloride 0.65 % Soln nasal spray Commonly known as: OCEAN Place 1 spray into both nostrils as needed for congestion.   Sulfacetamide Sodium (Acne) 10 % Lotn Apply 1 application topically See admin instructions. Apply to face once a day as directed       Follow-up Information    [name], [name], MD Follow up in 1 week(s).   Specialties: Family Medicine, Radiology Contact information: [place] [address] [phone]              Allergies  Allergen Reactions  . Codeine Nausea Only    Consultations: None   Procedures/Studies: DG Chest 2  View  Result Date: 05/17/2020 CLINICAL DATA:  69 year old female with history of chest pain and shortness of breath. Dry cough for a few days. EXAM: CHEST - 2 VIEW COMPARISON:  No priors. FINDINGS: Areas of interstitial prominence and some ill-defined opacities are noted in the lower lungs bilaterally, concerning for bronchitis and possible developing multilobar bronchopneumonia. Small bilateral pleural effusions. No evidence of pulmonary edema. No pneumothorax. Heart size is normal. Upper mediastinal contours are within normal limits. IMPRESSION: 1. The appearance the chest suggests bronchitis with developing multilobar bronchopneumonia. 2. Small bilateral pleural effusions. Electronically Signed   By: Vinnie Langton M.D.   On: 05/17/2020 11:21   CT Angio Chest PE W and/or Wo Contrast  Result Date: 05/17/2020 CLINICAL DATA:  Shortness of breath and cough with chest pain. EXAM: CT ANGIOGRAPHY CHEST WITH CONTRAST TECHNIQUE: Multidetector CT imaging of the chest was performed using the standard protocol during bolus administration of intravenous contrast. Multiplanar CT image reconstructions and MIPs were obtained to evaluate the vascular anatomy. CONTRAST:  12mL OMNIPAQUE IOHEXOL 350 MG/ML SOLN COMPARISON:  None. FINDINGS: Cardiovascular: Satisfactory opacification of the pulmonary arteries to the segmental level. No evidence of pulmonary embolism. Normal heart size. No pericardial effusion. Mediastinum/Nodes: There is mild right hilar lymphadenopathy. Thyroid gland, trachea, and esophagus demonstrate no significant findings. Lungs/Pleura: Mild linear scarring and/or atelectasis is seen within the inferior aspect of the left upper lobe and posterior aspect of the bilateral lower lobes. A trace amount of pleural fluid is seen, bilaterally. There is no evidence of pneumothorax. Upper Abdomen: No acute abnormality. Musculoskeletal: No chest wall abnormality. No acute or significant osseous findings. Review of  the MIP images confirms the above findings. IMPRESSION: 1. No evidence of pulmonary embolism. 2. Trace amount of pleural fluid, bilaterally. 3. Mild linear scarring and/or atelectasis within the inferior aspect of the left upper lobe and posterior aspect of the bilateral lower lobes. Electronically Signed   By: Virgina Norfolk M.D.   On: 05/17/2020 16:41      Discharge Exam: Vitals:   05/18/20 1130 05/18/20 1248  BP: 102/65   Pulse: 98   Resp: 20   Temp: 98.5 F (36.9 C)   SpO2: 95% 94%   Vitals:   05/18/20 0500 05/18/20 0900 05/18/20 1130 05/18/20 1248  BP:  113/61 102/65   Pulse:  (!) 114 98   Resp:  20 20   Temp:  98 F (36.7 C) 98.5 F (36.9 C)   TempSrc:  Oral Oral   SpO2:  90%  95% 94%  Weight: 70.4 kg     Height:        General: Pt is alert, awake, not in acute distress Cardiovascular: RRR, S1/S2 +, no rubs, no gallops Respiratory: CTA bilaterally, no wheezing, no rhonchi Abdominal: Soft, NT, ND, bowel sounds + Extremities: no edema, no cyanosis    The results of significant diagnostics from this hospitalization (including imaging, microbiology, ancillary and laboratory) are listed below for reference.     Microbiology: Recent Results (from the past 240 hour(s))  SARS Coronavirus 2 by RT PCR (hospital order, performed in [facility] hospital lab) Nasopharyngeal Nasopharyngeal Swab     Status: None   Collection Time: 05/17/20  3:52 PM   Specimen: Nasopharyngeal Swab  Result Value Ref Range Status   SARS Coronavirus 2 NEGATIVE NEGATIVE Final    Comment: (NOTE) SARS-CoV-2 target nucleic acids are NOT DETECTED.  The SARS-CoV-2 RNA is generally detectable in upper and lower respiratory specimens during the acute phase of infection. The lowest concentration of SARS-CoV-2 viral copies this assay can detect is 250 copies / mL. A negative result does not preclude SARS-CoV-2 infection and should not be used as the sole basis for treatment or other patient  management decisions.  A negative result [place] occur with improper specimen collection / handling, submission of specimen other than nasopharyngeal swab, presence of viral mutation(s) within the areas targeted by this assay, and inadequate number of viral copies (<250 copies / mL). A negative result must be combined with clinical observations, patient history, and epidemiological information.  Fact Sheet for Patients:   StrictlyIdeas.no  Fact Sheet for Healthcare Providers: BankingDealers.co.za  This test is not yet approved or  cleared by the [place] FDA and has been authorized for detection and/or diagnosis of SARS-CoV-2 by FDA under an Emergency Use Authorization (EUA).  This EUA will remain in effect (meaning this test can be used) for the duration of the COVID-19 declaration under Section 564(b)(1) of the Act, 21 U.S.C. section 360bbb-3(b)(1), unless the authorization is terminated or revoked sooner.  Performed at [facility], [place] [address], [place], [place] [address]   Culture, blood (single)     Status: None (Preliminary result)   Collection Time: 05/17/20  5:00 PM   Specimen: BLOOD  Result Value Ref Range Status   Specimen Description BLOOD LEFT ANTECUBITAL  Final   Special Requests   Final    BOTTLES DRAWN AEROBIC AND ANAEROBIC Blood Culture adequate volume   Culture   Final    NO GROWTH < 24 HOURS Performed at [facility], [place] [address] [place], [place] [address]    Report Status PENDING  Incomplete  Blood culture (routine x 2)     Status: None (Preliminary result)   Collection Time: 05/17/20  9:26 PM   Specimen: BLOOD LEFT HAND  Result Value Ref Range Status   Specimen Description BLOOD LEFT HAND  Final   Special Requests   Final    BOTTLES DRAWN AEROBIC AND ANAEROBIC Blood Culture adequate volume   Culture   Final    NO GROWTH < 12 HOURS Performed at [facility], [place] [address],  [place], [place] [address]    Report Status PENDING  Incomplete  Respiratory Panel by PCR     Status: None   Collection Time: 05/18/20  8:13 AM   Specimen: Nasopharyngeal Swab; Respiratory  Result Value Ref Range Status   Adenovirus NOT DETECTED NOT DETECTED Final   Coronavirus 229E  NOT DETECTED NOT DETECTED Final    Comment: (NOTE) The Coronavirus on the Respiratory Panel, DOES NOT test for the novel  Coronavirus (2019 nCoV)    Coronavirus HKU1 NOT DETECTED NOT DETECTED Final   Coronavirus NL63 NOT DETECTED NOT DETECTED Final   Coronavirus OC43 NOT DETECTED NOT DETECTED Final   Metapneumovirus NOT DETECTED NOT DETECTED Final   Rhinovirus / Enterovirus NOT DETECTED NOT DETECTED Final   Influenza A NOT DETECTED NOT DETECTED Final   Influenza B NOT DETECTED NOT DETECTED Final   Parainfluenza Virus 1 NOT DETECTED NOT DETECTED Final   Parainfluenza Virus 2 NOT DETECTED NOT DETECTED Final   Parainfluenza Virus 3 NOT DETECTED NOT DETECTED Final   Parainfluenza Virus 4 NOT DETECTED NOT DETECTED Final   Respiratory Syncytial Virus NOT DETECTED NOT DETECTED Final   Bordetella pertussis NOT DETECTED NOT DETECTED Final   Chlamydophila pneumoniae NOT DETECTED NOT DETECTED Final   Mycoplasma pneumoniae NOT DETECTED NOT DETECTED Final    Comment: Performed at [facility], [place] [address], [place], [place] [address]     Labs: BNP (last 3 results) Recent Labs    05/17/20 1040  BNP 270.6*   Basic Metabolic Panel: Recent Labs  Lab 05/17/20 1040 05/18/20 0339  NA 134* 132*  K 3.6 4.0  CL 102 103  CO2 22 21*  GLUCOSE 146* 171*  BUN 13 15  CREATININE 0.89 0.97  CALCIUM 8.7* 8.6*   Liver Function Tests: No results for input(s): AST, ALT, ALKPHOS, BILITOT, PROT, ALBUMIN in the last 168 hours. No results for input(s): LIPASE, AMYLASE in the last 168 hours. No results for input(s): AMMONIA in the last 168 hours. CBC: Recent Labs  Lab 05/13/20 1328 05/17/20 1040 05/18/20 0339  WBC  7.8 7.4 8.5  NEUTROABS 4.2  --   --   HGB 7.1* 7.2* 7.9*  HCT 22.0* 23.2* 23.8*  MCV 99.5 99.6 95.2  PLT 136* 84* 83*   Cardiac Enzymes: No results for input(s): CKTOTAL, CKMB, CKMBINDEX, TROPONINI in the last 168 hours. BNP: Invalid input(s): POCBNP CBG: No results for input(s): GLUCAP in the last 168 hours. D-Dimer No results for input(s): DDIMER in the last 72 hours. Hgb A1c No results for input(s): HGBA1C in the last 72 hours. Lipid Profile No results for input(s): CHOL, HDL, LDLCALC, TRIG, CHOLHDL, LDLDIRECT in the last 72 hours. Thyroid function studies No results for input(s): TSH, T4TOTAL, T3FREE, THYROIDAB in the last 72 hours.  Invalid input(s): FREET3 Anemia work up Recent Labs    05/18/20 0339  VITAMINB12 285  FOLATE 19.1  FERRITIN 545*  TIBC 193*  IRON 173*   Urinalysis    Component Value Date/Time   COLORURINE YELLOW 05/17/2020 1956   APPEARANCEUR CLEAR 05/17/2020 1956   LABSPEC 1.021 05/17/2020 1956   PHURINE 6.0 05/17/2020 1956   GLUCOSEU NEGATIVE 05/17/2020 1956   HGBUR NEGATIVE 05/17/2020 1956   HGBUR large 07/22/2009 [place] 05/17/2020 1956   KETONESUR NEGATIVE 05/17/2020 1956   PROTEINUR NEGATIVE 05/17/2020 1956   UROBILINOGEN 0.2 07/22/2009 1526   NITRITE NEGATIVE 05/17/2020 1956   LEUKOCYTESUR NEGATIVE 05/17/2020 1956   Sepsis Labs Invalid input(s): PROCALCITONIN,  WBC,  LACTICIDVEN Microbiology Recent Results (from the past 240 hour(s))  SARS Coronavirus 2 by RT PCR (hospital order, performed in [facility] hospital lab) Nasopharyngeal Nasopharyngeal Swab     Status: None   Collection Time: 05/17/20  3:52 PM   Specimen: Nasopharyngeal Swab  Result Value Ref Range  Status   SARS Coronavirus 2 NEGATIVE NEGATIVE Final    Comment: (NOTE) SARS-CoV-2 target nucleic acids are NOT DETECTED.  The SARS-CoV-2 RNA is generally detectable in upper and lower respiratory specimens during the acute phase of infection. The  lowest concentration of SARS-CoV-2 viral copies this assay can detect is 250 copies / mL. A negative result does not preclude SARS-CoV-2 infection and should not be used as the sole basis for treatment or other patient management decisions.  A negative result Islam occur with improper specimen collection / handling, submission of specimen other than nasopharyngeal swab, presence of viral mutation(s) within the areas targeted by this assay, and inadequate number of viral copies (<250 copies / mL). A negative result must be combined with clinical observations, patient history, and epidemiological information.  Fact Sheet for Patients:   StrictlyIdeas.no  Fact Sheet for Healthcare Providers: BankingDealers.co.za  This test is not yet approved or  cleared by the [place] FDA and has been authorized for detection and/or diagnosis of SARS-CoV-2 by FDA under an Emergency Use Authorization (EUA).  This EUA will remain in effect (meaning this test can be used) for the duration of the COVID-19 declaration under Section 564(b)(1) of the Act, 21 U.S.C. section 360bbb-3(b)(1), unless the authorization is terminated or revoked sooner.  Performed at [facility], [place] [address], [place], [place] [address]   Culture, blood (single)     Status: None (Preliminary result)   Collection Time: 05/17/20  5:00 PM   Specimen: BLOOD  Result Value Ref Range Status   Specimen Description BLOOD LEFT ANTECUBITAL  Final   Special Requests   Final    BOTTLES DRAWN AEROBIC AND ANAEROBIC Blood Culture adequate volume   Culture   Final    NO GROWTH < 24 HOURS Performed at [facility], [place] [address], [place], [place] [address]    Report Status PENDING  Incomplete  Blood culture (routine x 2)     Status: None (Preliminary result)   Collection Time: 05/17/20  9:26 PM   Specimen: BLOOD LEFT HAND  Result Value Ref Range Status   Specimen Description  BLOOD LEFT HAND  Final   Special Requests   Final    BOTTLES DRAWN AEROBIC AND ANAEROBIC Blood Culture adequate volume   Culture   Final    NO GROWTH < 12 HOURS Performed at [facility], [place] [address], [place], [place] [address]    Report Status PENDING  Incomplete  Respiratory Panel by PCR     Status: None   Collection Time: 05/18/20  8:13 AM   Specimen: Nasopharyngeal Swab; Respiratory  Result Value Ref Range Status   Adenovirus NOT DETECTED NOT DETECTED Final   Coronavirus 229E NOT DETECTED NOT DETECTED Final    Comment: (NOTE) The Coronavirus on the Respiratory Panel, DOES NOT test for the novel  Coronavirus (2019 nCoV)    Coronavirus HKU1 NOT DETECTED NOT DETECTED Final   Coronavirus NL63 NOT DETECTED NOT DETECTED Final   Coronavirus OC43 NOT DETECTED NOT DETECTED Final   Metapneumovirus NOT DETECTED NOT DETECTED Final   Rhinovirus / Enterovirus NOT DETECTED NOT DETECTED Final   Influenza A NOT DETECTED NOT DETECTED Final   Influenza B NOT DETECTED NOT DETECTED Final   Parainfluenza Virus 1 NOT DETECTED NOT DETECTED Final   Parainfluenza Virus 2 NOT DETECTED NOT DETECTED Final   Parainfluenza Virus 3 NOT DETECTED NOT DETECTED Final   Parainfluenza Virus 4 NOT DETECTED NOT DETECTED Final  Respiratory Syncytial Virus NOT DETECTED NOT DETECTED Final   Bordetella pertussis NOT DETECTED NOT DETECTED Final   Chlamydophila pneumoniae NOT DETECTED NOT DETECTED Final   Mycoplasma pneumoniae NOT DETECTED NOT DETECTED Final    Comment: Performed at [facility], [place] [address], [place], [place] [address]     Time coordinating discharge: Over 30 minutes  SIGNED:   Darliss Cheney, MD  [facility] 05/18/2020, 1:15 PM  If 7PM-7AM, please contact night-coverage [email or web address]

## 2020-05-18 NOTE — Progress Notes (Signed)
The pt walked 1 lap around the unit on pulse oximetry. The pt sustained an O2 saturation between 90 -91 % on RA, dipping down to 89% O2 on RA 2 times briefly. The pt denied any SOB and displayed no signs of distress while walking. MD updated of this, awaiting further instructions.

## 2020-05-18 NOTE — Progress Notes (Signed)
1UPRBC transfused per order, no noted adverse transfusion effects noted and verbalized. PRN Zofran was administered d/t nausea with relief. Still noted pt to have a strong non productive cough, IS encouraged to use. No c/o pain. Needs attended. Will continue to monitor.

## 2020-05-18 NOTE — Progress Notes (Signed)
Pt is ready for discharge. She is AOx4, no complaints of pain vitals are stable, IV and other medical equipment have been removed. Pt will go home with husband who is at bedside. AVS is given and the pt has no further questions or concerns.

## 2020-05-19 ENCOUNTER — Telehealth: Payer: Self-pay | Admitting: *Deleted

## 2020-05-19 ENCOUNTER — Telehealth: Payer: Self-pay | Admitting: Internal Medicine

## 2020-05-19 NOTE — Telephone Encounter (Signed)
Patient called reporting that she was just discharged from hospital yesterday with pneumonia and also received a blood transfusion yesterday. She reports weakness and sounds shortness of breath on phone. She is asking if she needs to keep appointment tomorrow with Alease Medina, NP or can this be cancelled. Please advise

## 2020-05-19 NOTE — Telephone Encounter (Signed)
Patient is covid testing negative.

## 2020-05-19 NOTE — Telephone Encounter (Signed)
Per Dr. Rogue Bussing-  pt needs to keep apts as scheduled

## 2020-05-19 NOTE — Telephone Encounter (Signed)
On 8/19-spoke to patient regarding her concerns of bronchitis; started on azithromycin. Await Covid testing.

## 2020-05-19 NOTE — Telephone Encounter (Signed)
Patient informed per Dr B to keep appointment as scheduled. She stated "Carla [name], OK"

## 2020-05-20 ENCOUNTER — Other Ambulatory Visit: Payer: Self-pay | Admitting: *Deleted

## 2020-05-20 ENCOUNTER — Other Ambulatory Visit: Payer: Self-pay

## 2020-05-20 ENCOUNTER — Inpatient Hospital Stay: Payer: PPO

## 2020-05-20 ENCOUNTER — Inpatient Hospital Stay (HOSPITAL_BASED_OUTPATIENT_CLINIC_OR_DEPARTMENT_OTHER): Payer: PPO | Admitting: Nurse Practitioner

## 2020-05-20 ENCOUNTER — Telehealth: Payer: Self-pay | Admitting: *Deleted

## 2020-05-20 VITALS — BP 123/58 | HR 80 | Temp 100.3°F | Resp 18 | Wt 161.0 lb

## 2020-05-20 VITALS — BP 105/50 | HR 74 | Temp 97.7°F | Resp 94

## 2020-05-20 DIAGNOSIS — D4620 Refractory anemia with excess of blasts, unspecified: Secondary | ICD-10-CM

## 2020-05-20 DIAGNOSIS — D649 Anemia, unspecified: Secondary | ICD-10-CM | POA: Diagnosis not present

## 2020-05-20 DIAGNOSIS — R110 Nausea: Secondary | ICD-10-CM

## 2020-05-20 DIAGNOSIS — J209 Acute bronchitis, unspecified: Secondary | ICD-10-CM

## 2020-05-20 DIAGNOSIS — J208 Acute bronchitis due to other specified organisms: Secondary | ICD-10-CM | POA: Diagnosis not present

## 2020-05-20 DIAGNOSIS — E860 Dehydration: Secondary | ICD-10-CM | POA: Diagnosis not present

## 2020-05-20 LAB — CBC WITH DIFFERENTIAL/PLATELET
Abs Immature Granulocytes: 0.19 10*3/uL — ABNORMAL HIGH (ref 0.00–0.07)
Basophils Absolute: 0 10*3/uL (ref 0.0–0.1)
Basophils Relative: 0 %
Eosinophils Absolute: 0 10*3/uL (ref 0.0–0.5)
Eosinophils Relative: 0 %
HCT: 22.2 % — ABNORMAL LOW (ref 36.0–46.0)
Hemoglobin: 7.5 g/dL — ABNORMAL LOW (ref 12.0–15.0)
Immature Granulocytes: 2 %
Lymphocytes Relative: 18 %
Lymphs Abs: 1.9 10*3/uL (ref 0.7–4.0)
MCH: 31.4 pg (ref 26.0–34.0)
MCHC: 33.8 g/dL (ref 30.0–36.0)
MCV: 92.9 fL (ref 80.0–100.0)
Monocytes Absolute: 3.4 10*3/uL — ABNORMAL HIGH (ref 0.1–1.0)
Monocytes Relative: 33 %
Neutro Abs: 4.7 10*3/uL (ref 1.7–7.7)
Neutrophils Relative %: 47 %
Platelets: 139 10*3/uL — ABNORMAL LOW (ref 150–400)
RBC: 2.39 MIL/uL — ABNORMAL LOW (ref 3.87–5.11)
RDW: 26.8 % — ABNORMAL HIGH (ref 11.5–15.5)
WBC: 10.2 10*3/uL (ref 4.0–10.5)
nRBC: 1.2 % — ABNORMAL HIGH (ref 0.0–0.2)

## 2020-05-20 LAB — BASIC METABOLIC PANEL
Anion gap: 10 (ref 5–15)
BUN: 29 mg/dL — ABNORMAL HIGH (ref 8–23)
CO2: 21 mmol/L — ABNORMAL LOW (ref 22–32)
Calcium: 8.2 mg/dL — ABNORMAL LOW (ref 8.9–10.3)
Chloride: 96 mmol/L — ABNORMAL LOW (ref 98–111)
Creatinine, Ser: 1.23 mg/dL — ABNORMAL HIGH (ref 0.44–1.00)
GFR calc Af Amer: 52 mL/min — ABNORMAL LOW (ref >60)
GFR calc non Af Amer: 45 mL/min — ABNORMAL LOW (ref >60)
Glucose, Bld: 114 mg/dL — ABNORMAL HIGH (ref 70–99)
Potassium: 3.8 mmol/L (ref 3.5–5.1)
Sodium: 127 mmol/L — ABNORMAL LOW (ref 135–145)

## 2020-05-20 LAB — SAMPLE TO BLOOD BANK

## 2020-05-20 LAB — HEPATIC FUNCTION PANEL
ALT: 57 U/L — ABNORMAL HIGH (ref 0–44)
AST: 86 U/L — ABNORMAL HIGH (ref 15–41)
Albumin: 3.3 g/dL — ABNORMAL LOW (ref 3.5–5.0)
Alkaline Phosphatase: 32 U/L — ABNORMAL LOW (ref 38–126)
Bilirubin, Direct: 0.5 mg/dL — ABNORMAL HIGH (ref 0.0–0.2)
Indirect Bilirubin: 1.2 mg/dL — ABNORMAL HIGH (ref 0.3–0.9)
Total Bilirubin: 1.7 mg/dL — ABNORMAL HIGH (ref 0.3–1.2)
Total Protein: 7.6 g/dL (ref 6.5–8.1)

## 2020-05-20 LAB — LACTATE DEHYDROGENASE: LDH: 220 U/L — ABNORMAL HIGH (ref 98–192)

## 2020-05-20 LAB — PREPARE RBC (CROSSMATCH)

## 2020-05-20 MED ORDER — DIPHENHYDRAMINE HCL 25 MG PO CAPS
25.0000 mg | ORAL_CAPSULE | Freq: Once | ORAL | Status: AC
  Administered 2020-05-20: 25 mg via ORAL
  Filled 2020-05-20: qty 1

## 2020-05-20 MED ORDER — ALBUTEROL SULFATE HFA 108 (90 BASE) MCG/ACT IN AERS: 2.0000 | INHALATION_SPRAY | Freq: Four times a day (QID) | RESPIRATORY_TRACT | 2 refills | Status: DC | PRN

## 2020-05-20 MED ORDER — SODIUM CHLORIDE 0.9% IV SOLUTION
250.0000 mL | Freq: Once | INTRAVENOUS | Status: AC
  Administered 2020-05-20: 250 mL via INTRAVENOUS
  Filled 2020-05-20: qty 250

## 2020-05-20 MED ORDER — SODIUM CHLORIDE 0.9 % IV SOLN
10.0000 mg | Freq: Once | INTRAVENOUS | Status: AC
  Administered 2020-05-20: 10 mg via INTRAVENOUS
  Filled 2020-05-20: qty 10

## 2020-05-20 MED ORDER — FUROSEMIDE 10 MG/ML IJ SOLN: 20.0000 mg | Freq: Once | INTRAMUSCULAR | Status: DC

## 2020-05-20 MED ORDER — ONDANSETRON 8 MG PO TBDP: 8.0000 mg | ORAL_TABLET | Freq: Three times a day (TID) | ORAL | 0 refills | Status: DC | PRN

## 2020-05-20 MED ORDER — IPRATROPIUM-ALBUTEROL 0.5-2.5 (3) MG/3ML IN SOLN: 3.0000 mL | Freq: Four times a day (QID) | RESPIRATORY_TRACT | 0 refills | Status: DC | PRN

## 2020-05-20 MED ORDER — ACETAMINOPHEN 325 MG PO TABS
650.0000 mg | ORAL_TABLET | Freq: Once | ORAL | Status: AC
  Administered 2020-05-20: 650 mg via ORAL
  Filled 2020-05-20: qty 2

## 2020-05-20 MED ORDER — ONDANSETRON HCL 4 MG/2ML IJ SOLN
8.0000 mg | Freq: Once | INTRAMUSCULAR | Status: AC
  Administered 2020-05-20: 8 mg via INTRAVENOUS
  Filled 2020-05-20: qty 4

## 2020-05-20 MED ORDER — PREDNISONE 10 MG PO TABS: 10.0000 mg | ORAL_TABLET | Freq: Every day | ORAL | 0 refills | Status: DC

## 2020-05-20 NOTE — Progress Notes (Signed)
[facility]  [phone] [phone]  Patient Care Team: Abner Greenspan, MD as PCP - General Cammie Sickle, MD as Consulting Physician (Hematology and Oncology)   Name of the patient: Carla [name]  [id number]  [birth year]   Date of visit: 05/20/20  Diagnosis-MDS  Chief complaint/ Reason for visit-shortness of breath  Heme/Onc history:  Oncology History Overview Note   # July 2020-myelodysplastic syndrome with ringed sideroblasts-Normal karyotype; no blasts [IPSS R-Very low risk ~median survival 8.3 years]; iron studies W73 folic acid myeloma panel normal; pyridoxine levels/copper/zinc-WNL. Erythropoietin levels-60. 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  # colonoscopy- 2016 [Dr.Bucinni];  DIAGNOSIS: MDS/low-grade with ringed sideroblasts  STAGE: Low       ;  GOALS: Control  CURRENT/MOST RECENT THERAPY : EPO agent+ REV    MDS (myelodysplastic syndrome), low grade (HCC)  04/23/2019 Initial Diagnosis   MDS (myelodysplastic syndrome), low grade (HCC)     Interval history-patient with above history of MDS currently receiving Aranesp and Revlimid presents to clinic for shortness of breath.  She was admitted to the hospital on 05/17/2020-05/18/20 and diagnosed with viral bronchopneumonia.  SARS-CoV-2 PCR was negative.  Chest x-ray showed changes suggestive for bronchitis with developing multi lobar bronchopneumonia with small bilateral pleural effusions.  CTA was negative for PE.  Linear scarring seen within the inferior aspect of the left upper lobe and posterior aspect of bilateral lower  lobes.  She received IV antibiotics.  Hemoglobin was 7.2 for which she received 1 unit of PRBCs.  Hemoglobin improved to 7.9 posttransfusion.  She was never hypoxic and did not require oxygen.  Flu and RVP were negative.  Per hospitalist note, likely viral bronchitis and did not indicate for antibiotics.  Her Revlimid was held. Today, she says that she has continued to have shortness of breath, weakness, swelling of the lower extremities.  Feels bloated in her abdomen. Symptoms are about the same as when she was discharged.   Denies any neurologic complaints. Denies recent fevers or illnesses. Denies any easy bleeding or bruising. Reports good appetite and denies weight loss. Denies chest pain. Denies any nausea, vomiting, constipation, or diarrhea. Denies urinary complaints. Patient offers no further specific complaints today.  ECOG FS:3 - Symptomatic, >50% confined to bed  Review of systems- Review of Systems  Constitutional: Positive for malaise/fatigue. Negative for chills, fever and weight loss.  HENT: Negative for hearing loss, nosebleeds, sore throat and tinnitus.   Eyes: Negative for blurred vision and double vision.  Respiratory: Positive for cough and shortness of breath. Negative for hemoptysis and wheezing.   Cardiovascular: Positive for leg swelling. Negative for chest pain and palpitations.  Gastrointestinal: Positive for nausea. Negative for abdominal pain (abdominal bloating), blood in stool, constipation, diarrhea, melena and vomiting.  Genitourinary: Negative for dysuria and urgency.  Musculoskeletal: Negative for back pain, falls, joint pain and myalgias.  Skin: Negative for itching and rash.  Neurological: Positive for weakness. Negative for dizziness, tingling, sensory change, loss of consciousness and headaches.  Endo/Heme/Allergies: Negative for environmental allergies. Does not bruise/bleed easily.  Psychiatric/Behavioral: Negative for depression. The patient has insomnia.  The patient is not nervous/anxious.      Current treatment- revlimid & aranesp  Allergies  Allergen Reactions  Codeine Nausea Only    Past Medical History:  Diagnosis Date   Allergic rhinitis, cause unspecified    Anemia, unspecified    Carpal tunnel syndrome    Cystitis, unspecified    Family history of osteoporosis    PONV (postoperative nausea and vomiting)    Postnasal drip    Syncope and collapse     Past Surgical History:  Procedure Laterality Date   CARPAL TUNNEL RELEASE     COLONOSCOPY  2005   COLONOSCOPY WITH PROPOFOL N/A 07/31/2015   Procedure: COLONOSCOPY WITH PROPOFOL;  Surgeon: Ronald Lobo, MD;  Location: [facility];  Service: Endoscopy;  Laterality: N/A;   DIAGNOSTIC LAPAROSCOPY     dx lack of pregnancy    EYE SURGERY     lasik, cataract, prk,yag procedure    Social History   Socioeconomic History   Marital status: Married    Spouse name: Not on file   Number of children: Not on file   Years of education: Not on file   Highest education level: Not on file  Occupational History   Not on file  Tobacco Use   Smoking status: Never Smoker   Smokeless tobacco: Never Used  Vaping Use   Vaping Use: Never used  Substance and Sexual Activity   Alcohol use: Yes    Alcohol/week: 0.0 standard drinks    Comment: Rare   Drug use: No   Sexual activity: Not Currently  Other Topics Concern   Not on file  Social History Narrative   No regular exercise      Drinks lots of [employer]         Social Determinants of Health   Financial Resource Strain: Low Risk    Difficulty of Paying Living Expenses: Not hard at all  Food Insecurity: No Food Insecurity   Worried About Charity fundraiser in the Last Year: Never true   Ran Out of Food in the Last Year: Never true  Transportation Needs: No Transportation Needs   Lack of Transportation (Medical): No   Lack of Transportation (Non-Medical): No  Physical Activity: Inactive    Days of Exercise per Week: 0 days   Minutes of Exercise per Session: 0 min  Stress: No Stress Concern Present   Feeling of Stress : Not at all  Social Connections:    Frequency of Communication with Friends and Family: Not on file   Frequency of Social Gatherings with Friends and Family: Not on file   Attends Religious Services: Not on file   Active Member of Clubs or Organizations: Not on file   Attends Archivist Meetings: Not on file   Marital Status: Not on file  Intimate Partner Violence: Not At Risk   Fear of Current or Ex-Partner: No   Emotionally Abused: No   Physically Abused: No   Sexually Abused: No    Family History  Problem Relation Age of Onset   Osteoporosis Mother    Stroke Mother    Coronary artery disease Father    Stroke Father 38   Diabetes Other        Aunts and uncles   Breast cancer Paternal Aunt    Breast cancer Maternal Aunt    Arthritis/Rheumatoid Child    Breast cancer Cousin      Current Outpatient Medications:    acetaminophen (TYLENOL) 500 MG tablet, Take 500-1,000 mg by mouth every 6 (six) hours as needed for mild pain or headache., Disp: , Rfl:    aspirin  EC 81 MG tablet, Take 81 mg by mouth at bedtime. Swallow whole. , Disp: , Rfl:    dextromethorphan (DELSYM) 30 MG/5ML liquid, Take 15-30 mg by mouth every 12 (twelve) hours as needed for cough. , Disp: , Rfl:    levothyroxine (SYNTHROID) 25 MCG tablet, Take 1 tablet (25 mcg total) by mouth daily before breakfast., Disp: 90 tablet, Rfl: 3   sodium chloride (OCEAN) 0.65 % SOLN nasal spray, Place 1 spray into both nostrils as needed for congestion., Disp: , Rfl:    Sulfacetamide Sodium, Acne, 10 % LOTN, Apply 1 application topically See admin instructions. Apply to face once a day as directed, Disp: , Rfl:    BIOTIN PO, Take 1 tablet by mouth 3 (three) times a week.  (Patient not taking: Reported on 05/20/2020), Disp: , Rfl:    Calcium Carb-Cholecalciferol  (CALCIUM 600+D3 PO), Take 1 tablet by mouth 3 (three) times a week.  (Patient not taking: Reported on 05/20/2020), Disp: , Rfl:    cetirizine (ZYRTEC) 10 MG tablet, Take 10 mg by mouth daily as needed for allergies.  (Patient not taking: Reported on 05/20/2020), Disp: , Rfl:    chlorpheniramine-HYDROcodone (TUSSIONEX) 10-8 MG/5ML SUER, Take 5 mLs by mouth every 12 (twelve) hours as needed for cough (prn cough). (Patient not taking: Reported on 05/20/2020), Disp: 140 mL, Rfl: 0   Darbepoetin Alfa (ARANESP) 500 MCG/ML SOSY injection, Inject 500 mcg into the skin See admin instructions. Inject 500 mcg into the skin every other week, Disp: , Rfl:    [START ON 05/21/2020] lenalidomide (REVLIMID) 10 MG capsule, Take 1 capsule (10 mg total) by mouth daily. (Patient not taking: Reported on 05/20/2020), Disp: 28 capsule, Rfl: 0  Physical exam:  Vitals:   05/20/20 0907  BP: (!) 123/58  Pulse: 80  Resp: 18  Temp: 100.3 F (37.9 C)  TempSrc: Tympanic  SpO2: 98%  Weight: 161 lb (73 kg)   Physical Exam Constitutional:      Appearance: She is well-developed. She is ill-appearing.  HENT:     Head: Atraumatic.  Eyes:     General: No scleral icterus.    Conjunctiva/sclera: Conjunctivae normal.     Comments: Fatigued appearing  Cardiovascular:     Rate and Rhythm: Normal rate and regular rhythm.     Pulses: Normal pulses.     Heart sounds: Normal heart sounds.  Pulmonary:     Effort: Pulmonary effort is normal. No respiratory distress.     Breath sounds: Normal breath sounds. No wheezing or rales.  Abdominal:     General: Bowel sounds are normal. There is no distension.     Palpations: Abdomen is soft.     Tenderness: There is no abdominal tenderness. There is no right CVA tenderness, left CVA tenderness, guarding or rebound.  Musculoskeletal:        General: No swelling or deformity. Normal range of motion.     Cervical back: Normal range of motion and neck supple.     Right lower leg: No  edema.     Left lower leg: No edema.     Comments: wheelchair  Skin:    General: Skin is warm and dry.     Coloration: Skin is pale.  Neurological:     Mental Status: She is alert and oriented to person, place, and time.     Motor: No weakness.  Psychiatric:        Mood and Affect: Mood normal.  Behavior: Behavior normal.      CMP Latest Ref Rng & Units 05/20/2020  Glucose 70 - 99 mg/dL 114(H)  BUN 8 - 23 mg/dL 29(H)  Creatinine 0.44 - 1.00 mg/dL 1.23(H)  Sodium 135 - 145 mmol/L 127(L)  Potassium 3.5 - 5.1 mmol/L 3.8  Chloride 98 - 111 mmol/L 96(L)  CO2 22 - 32 mmol/L 21(L)  Calcium 8.9 - 10.3 mg/dL 8.2(L)  Total Protein 6.5 - 8.1 g/dL -  Total Bilirubin 0.3 - 1.2 mg/dL -  Alkaline Phos 38 - 126 U/L -  AST 15 - 41 U/L -  ALT 0 - 44 U/L -   CBC Latest Ref Rng & Units 05/20/2020  WBC 4.0 - 10.5 K/uL 10.2  Hemoglobin 12.0 - 15.0 g/dL 7.5(L)  Hematocrit 36 - 46 % 22.2(L)  Platelets 150 - 400 K/uL 139(L)    No images are attached to the encounter.  DG Chest 2 View  Result Date: 05/17/2020 CLINICAL DATA:  69 year old female with history of chest pain and shortness of breath. Dry cough for a few days. EXAM: CHEST - 2 VIEW COMPARISON:  No priors. FINDINGS: Areas of interstitial prominence and some ill-defined opacities are noted in the lower lungs bilaterally, concerning for bronchitis and possible developing multilobar bronchopneumonia. Small bilateral pleural effusions. No evidence of pulmonary edema. No pneumothorax. Heart size is normal. Upper mediastinal contours are within normal limits. IMPRESSION: 1. The appearance the chest suggests bronchitis with developing multilobar bronchopneumonia. 2. Small bilateral pleural effusions. Electronically Signed   By: Vinnie Langton M.D.   On: 05/17/2020 11:21   CT Angio Chest PE W and/or Wo Contrast  Result Date: 05/17/2020 CLINICAL DATA:  Shortness of breath and cough with chest pain. EXAM: CT ANGIOGRAPHY CHEST WITH CONTRAST  TECHNIQUE: Multidetector CT imaging of the chest was performed using the standard protocol during bolus administration of intravenous contrast. Multiplanar CT image reconstructions and MIPs were obtained to evaluate the vascular anatomy. CONTRAST:  9m OMNIPAQUE IOHEXOL 350 MG/ML SOLN COMPARISON:  None. FINDINGS: Cardiovascular: Satisfactory opacification of the pulmonary arteries to the segmental level. No evidence of pulmonary embolism. Normal heart size. No pericardial effusion. Mediastinum/Nodes: There is mild right hilar lymphadenopathy. Thyroid gland, trachea, and esophagus demonstrate no significant findings. Lungs/Pleura: Mild linear scarring and/or atelectasis is seen within the inferior aspect of the left upper lobe and posterior aspect of the bilateral lower lobes. A trace amount of pleural fluid is seen, bilaterally. There is no evidence of pneumothorax. Upper Abdomen: No acute abnormality. Musculoskeletal: No chest wall abnormality. No acute or significant osseous findings. Review of the MIP images confirms the above findings. IMPRESSION: 1. No evidence of pulmonary embolism. 2. Trace amount of pleural fluid, bilaterally. 3. Mild linear scarring and/or atelectasis within the inferior aspect of the left upper lobe and posterior aspect of the bilateral lower lobes. Electronically Signed   By: TVirgina NorfolkM.D.   On: 05/17/2020 16:41    Assessment and plan- Patient is a 69y.o. female diagnosed with MDS who presents to [facility] clinic for follow up after hospitalization for viral bronchopneumonia.   1. Viral bronchopneumonia- SARS coV pcr negative x 2, negative flu, neg RVP. CT consistent with bronchitis suggestive of viral etiology. CT independently reviewed by Dr. BRogue Bussingand myself today. Low grade temp at home. No leukocytosis. Recommend supportive care. Start combivent nebulizer treatments at home with albuterol inhaler. Will give dex 10 mg in clinic today followed by  prednisone 10 mg daily starting on 05/21/20. Continue  delsym for cough. Add mucinex.   2. MDS- hold revlimid. Will give 1 u pRBCs today in clinic for symptomatic anemia. Labs reviewed. Hmg 7.5.  Discussed lasix following given leg swelling but held due to low blood pressures. Not taking antihypertensives.   3. Nausea- likely secondary to viral etiology. Zofran in clinic today. Sent prescription for zofran 8 mg ODT.   4. Dehydration- secondary to nausea and poor oral intake. Encouraged fluid hydration. Sips as tolerated with management of nausea as above. Cr elevated from baseline. Sodium low. Soft blood pressure. Continue to monitor.   5. Pain- buttock pain d/t sitting/laying. Tylenol in clinic. Discussed tylenol at home not to exceed 3000 mg in 24 hour period given abnormal LFTs.   RTC in 2 days for labs, re-evaluation, and possible transfusion. If symptoms worsen or do not improve in interim, recommend ER for further evaluation.   Visit Diagnosis 1. Acute viral bronchitis   2. MDS (myelodysplastic syndrome), low grade (HCC)   3. Symptomatic anemia   4. Nausea without vomiting   5. Dehydration     Patient expressed understanding and was in agreement with this plan. She also understands that She can call clinic at any time with any questions, concerns, or complaints.   Thank you for allowing me to participate in the care of this very pleasant patient.   Beckey Rutter, DNP, AGNP-[facility]  CC: Dr. Rogue Bussing

## 2020-05-20 NOTE — Telephone Encounter (Signed)
Spoke with Zac at [employer]. Nebulizer machine has been ordered.

## 2020-05-20 NOTE — Progress Notes (Signed)
Hospitalization discharged on Sunday. Admitted Saturday. Pt having chest pain and swelling of LE's. She thought she had CHF. Dx URI/bronchitis. NP cough. Dyspnea with exertion. Hgb was 7, she was given 1 unit blood. Today she is weak. Buttocks hurt from sitting all night in ER and laying in hospital bed. Weak getting up and down due to soreness of "buttock muscles". Not eating more then a bite here and there of soup, jello, saltines.

## 2020-05-20 NOTE — Progress Notes (Signed)
IV lasix held after blood transfusion due to BP of 105/50 Per Dr. Rogue Bussing.

## 2020-05-21 DIAGNOSIS — Z961 Presence of intraocular lens: Secondary | ICD-10-CM | POA: Diagnosis not present

## 2020-05-21 LAB — TYPE AND SCREEN
ABO/RH(D): B POS
Antibody Screen: NEGATIVE
Unit division: 0

## 2020-05-21 LAB — BPAM RBC
Blood Product Expiration Date: 202109092359
ISSUE DATE / TIME: 202108241220
Unit Type and Rh: 1700

## 2020-05-22 ENCOUNTER — Inpatient Hospital Stay: Payer: PPO

## 2020-05-22 ENCOUNTER — Telehealth: Payer: Self-pay | Admitting: *Deleted

## 2020-05-22 ENCOUNTER — Other Ambulatory Visit: Payer: Self-pay

## 2020-05-22 ENCOUNTER — Inpatient Hospital Stay (HOSPITAL_BASED_OUTPATIENT_CLINIC_OR_DEPARTMENT_OTHER): Payer: PPO | Admitting: Nurse Practitioner

## 2020-05-22 VITALS — BP 127/57 | HR 64 | Temp 97.2°F | Resp 17 | Wt 160.2 lb

## 2020-05-22 DIAGNOSIS — M7989 Other specified soft tissue disorders: Secondary | ICD-10-CM | POA: Diagnosis not present

## 2020-05-22 DIAGNOSIS — E876 Hypokalemia: Secondary | ICD-10-CM | POA: Diagnosis not present

## 2020-05-22 DIAGNOSIS — D4620 Refractory anemia with excess of blasts, unspecified: Secondary | ICD-10-CM

## 2020-05-22 DIAGNOSIS — D649 Anemia, unspecified: Secondary | ICD-10-CM

## 2020-05-22 DIAGNOSIS — J208 Acute bronchitis due to other specified organisms: Secondary | ICD-10-CM | POA: Diagnosis not present

## 2020-05-22 LAB — CULTURE, BLOOD (ROUTINE X 2)
Culture: NO GROWTH
Special Requests: ADEQUATE

## 2020-05-22 LAB — CBC WITH DIFFERENTIAL/PLATELET
Abs Immature Granulocytes: 1.42 10*3/uL — ABNORMAL HIGH (ref 0.00–0.07)
Basophils Absolute: 0.1 10*3/uL (ref 0.0–0.1)
Basophils Relative: 1 %
Eosinophils Absolute: 0 10*3/uL (ref 0.0–0.5)
Eosinophils Relative: 0 %
HCT: 26.7 % — ABNORMAL LOW (ref 36.0–46.0)
Hemoglobin: 9.1 g/dL — ABNORMAL LOW (ref 12.0–15.0)
Immature Granulocytes: 11 %
Lymphocytes Relative: 17 %
Lymphs Abs: 2.2 10*3/uL (ref 0.7–4.0)
MCH: 31.1 pg (ref 26.0–34.0)
MCHC: 34.1 g/dL (ref 30.0–36.0)
MCV: 91.1 fL (ref 80.0–100.0)
Monocytes Absolute: 2.5 10*3/uL — ABNORMAL HIGH (ref 0.1–1.0)
Monocytes Relative: 19 %
Neutro Abs: 6.8 10*3/uL (ref 1.7–7.7)
Neutrophils Relative %: 52 %
Platelets: 188 10*3/uL (ref 150–400)
RBC: 2.93 MIL/uL — ABNORMAL LOW (ref 3.87–5.11)
RDW: 24.3 % — ABNORMAL HIGH (ref 11.5–15.5)
Smear Review: NORMAL
WBC Morphology: INCREASED
WBC: 13 10*3/uL — ABNORMAL HIGH (ref 4.0–10.5)
nRBC: 3.1 % — ABNORMAL HIGH (ref 0.0–0.2)

## 2020-05-22 LAB — BASIC METABOLIC PANEL
Anion gap: 11 (ref 5–15)
BUN: 40 mg/dL — ABNORMAL HIGH (ref 8–23)
CO2: 23 mmol/L (ref 22–32)
Calcium: 8.8 mg/dL — ABNORMAL LOW (ref 8.9–10.3)
Chloride: 101 mmol/L (ref 98–111)
Creatinine, Ser: 1.16 mg/dL — ABNORMAL HIGH (ref 0.44–1.00)
GFR calc Af Amer: 56 mL/min — ABNORMAL LOW (ref >60)
GFR calc non Af Amer: 48 mL/min — ABNORMAL LOW (ref >60)
Glucose, Bld: 120 mg/dL — ABNORMAL HIGH (ref 70–99)
Potassium: 3.3 mmol/L — ABNORMAL LOW (ref 3.5–5.1)
Sodium: 135 mmol/L (ref 135–145)

## 2020-05-22 LAB — CULTURE, BLOOD (SINGLE)
Culture: NO GROWTH
Special Requests: ADEQUATE

## 2020-05-22 MED ORDER — POTASSIUM CHLORIDE ER 10 MEQ PO TBCR
10.0000 meq | EXTENDED_RELEASE_TABLET | Freq: Every day | ORAL | 0 refills | Status: DC
Start: 2020-05-22 — End: 2020-07-08

## 2020-05-22 MED ORDER — FUROSEMIDE 20 MG PO TABS: 20.0000 mg | ORAL_TABLET | Freq: Every day | ORAL | 0 refills | Status: DC | PRN

## 2020-05-22 NOTE — Telephone Encounter (Signed)
Spoke to pt's husband letting him know I will place patients TED hose at [facility] desk for him to pick up at his convenience. He then told me the [facility] called him to let him know the nebulizer will be shipped to [facility] at [address]. Olam Idler was told to go pick up equipment there. Will they show me how to use it?? I told him I would certainly imagine that they would do that. I left a VM with Zack from [facility].

## 2020-05-22 NOTE — Progress Notes (Signed)
[facility]  [phone] [phone]  Patient Care Team: Glori Bickers, [name], MD as PCP - General Cammie Sickle, MD as Consulting Physician (Hematology and Oncology)   Name of the patient: Carla [name]  [id number]  15-Oct-1950   Date of visit: 05/22/20  Diagnosis-low-grade MDS  Chief complaint/ Reason for visit-fatigue and anemia  Heme/Onc history:  Oncology History Overview Note   # July 2020-myelodysplastic syndrome with ringed sideroblasts-Normal karyotype; no blasts [IPSS R-Very low risk ~median survival 8.3 years]; iron studies N39 folic acid myeloma panel normal; pyridoxine levels/copper/zinc-WNL. Erythropoietin levels-60. 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  # colonoscopy- 2016 [Dr.Bucinni];  DIAGNOSIS: MDS/low-grade with ringed sideroblasts  STAGE: Low       ;  GOALS: Control  CURRENT/MOST RECENT THERAPY : EPO agent+ REV    MDS (myelodysplastic syndrome), low grade (HCC)  04/23/2019 Initial Diagnosis   MDS (myelodysplastic syndrome), low grade (HCC)     Interval history-patient presents to symptom management clinic for follow-up for symptomatic anemia secondary to MDS.  She was seen in clinic on 05/20/2020 and received blood transfusion.  She complained of leg swelling but due to low blood pressure, Lasix was held.  She has not yet received nebulizer machine but has noticed improvement in breathing with inhaler.  Fatigue also improved.  Overall feels better.  Cough is improving.  Continuing steroids.  No vomiting and tolerating Zofran well.  Eating and drinking more normally.   Continues to hold Revlimid  ECOG FS:1 - Symptomatic but completely ambulatory  Review of systems- Review of Systems  Constitutional: Positive for malaise/fatigue (Improving). Negative for chills, fever and weight loss.  HENT: Negative for hearing loss, nosebleeds, sore throat and tinnitus.   Eyes: Negative for blurred vision and double vision.  Respiratory: Positive for cough (Improving). Negative for hemoptysis, shortness of breath and wheezing.   Cardiovascular: Negative for chest pain, palpitations and leg swelling.  Gastrointestinal: Negative for abdominal pain, blood in stool, constipation, diarrhea, melena, nausea and vomiting.  Genitourinary: Negative for dysuria and urgency.  Musculoskeletal: Negative for back pain, falls, joint pain and myalgias.  Skin: Negative for itching and rash.  Neurological: Negative for dizziness, tingling, sensory change, loss of consciousness, weakness and headaches.  Endo/Heme/Allergies: Negative for environmental allergies. Does not bruise/bleed easily.  Psychiatric/Behavioral: Negative for depression. The patient is not nervous/anxious and does not have insomnia.      Current treatment- Revlimid  Allergies  Allergen Reactions  . Codeine Nausea Only    Past Medical History:  Diagnosis Date  . Allergic rhinitis, cause unspecified   . Anemia, unspecified   . Carpal tunnel syndrome   . Cystitis, unspecified   . Family history of osteoporosis   . PONV (postoperative nausea and vomiting)   . Postnasal drip   . Syncope and collapse     Past Surgical History:  Procedure Laterality Date  . CARPAL TUNNEL RELEASE    . COLONOSCOPY  2005  . COLONOSCOPY WITH PROPOFOL N/A 07/31/2015   Procedure: COLONOSCOPY WITH PROPOFOL;  Surgeon: RRonald Lobo MD;  Location: [facility];  Service: Endoscopy;  Laterality: N/A;  . DIAGNOSTIC LAPAROSCOPY     dx lack of pregnancy   . EYE SURGERY     lasik, cataract, prk,yag  procedure    Social History    Socioeconomic History  . Marital status: Married    Spouse name: Not on file  . Number of children: Not on file  . Years of education: Not on file  . Highest education level: Not on file  Occupational History  . Not on file  Tobacco Use  . Smoking status: Never Smoker  . Smokeless tobacco: Never Used  Vaping Use  . Vaping Use: Never used  Substance and Sexual Activity  . Alcohol use: Yes    Alcohol/week: 0.0 standard drinks    Comment: Rare  . Drug use: No  . Sexual activity: Not Currently  Other Topics Concern  . Not on file  Social History Narrative   No regular exercise      Drinks lots of [employer]         Social Determinants of Health   Financial Resource Strain: Low Risk   . Difficulty of Paying Living Expenses: Not hard at all  Food Insecurity: No Food Insecurity  . Worried About Charity fundraiser in the Last Year: Never true  . Ran Out of Food in the Last Year: Never true  Transportation Needs: No Transportation Needs  . Lack of Transportation (Medical): No  . Lack of Transportation (Non-Medical): No  Physical Activity: Inactive  . Days of Exercise per Week: 0 days  . Minutes of Exercise per Session: 0 min  Stress: No Stress Concern Present  . Feeling of Stress : Not at all  Social Connections:   . Frequency of Communication with Friends and Family: Not on file  . Frequency of Social Gatherings with Friends and Family: Not on file  . Attends Religious Services: Not on file  . Active Member of Clubs or Organizations: Not on file  . Attends Archivist Meetings: Not on file  . Marital Status: Not on file  Intimate Partner Violence: Not At Risk  . Fear of Current or Ex-Partner: No  . Emotionally Abused: No  . Physically Abused: No  . Sexually Abused: No    Family History  Problem Relation Age of Onset  . Osteoporosis Mother   . Stroke Mother   . Coronary artery disease Father   . Stroke Father 44  . Diabetes Other        Aunts and uncles   . Breast cancer Paternal Aunt   . Breast cancer Maternal Aunt   . Arthritis/Rheumatoid Child   . Breast cancer Cousin      Current Outpatient Medications:  .  acetaminophen (TYLENOL) 500 MG tablet, Take 500-1,000 mg by mouth every 6 (six) hours as needed for mild pain or headache., Disp: , Rfl:  .  albuterol (VENTOLIN HFA) 108 (90 Base) MCG/ACT inhaler, Inhale 2 puffs into the lungs every 6 (six) hours as needed for wheezing or shortness of breath., Disp: 8 g, Rfl: 2 .  dextromethorphan (DELSYM) 30 MG/5ML liquid, Take 15-30 mg by mouth every 12 (twelve) hours as needed for cough. , Disp: , Rfl:  .  loratadine (CLARITIN) 10 MG tablet, Take 10 mg by mouth daily., Disp: , Rfl:  .  predniSONE (DELTASONE) 10 MG tablet, Take 1 tablet (10 mg total) by mouth daily with breakfast., Disp: 7 tablet, Rfl: 0 .  sodium chloride (OCEAN) 0.65 % SOLN nasal spray, Place 1 spray into both nostrils as needed for congestion., Disp: , Rfl:  .  Sulfacetamide Sodium, Acne, 10 % LOTN, Apply 1 application topically See admin instructions.  Apply to face once a day as directed, Disp: , Rfl:  .  aspirin EC 81 MG tablet, Take 81 mg by mouth at bedtime. Swallow whole.  (Patient not taking: Reported on 05/22/2020), Disp: , Rfl:  .  BIOTIN PO, Take 1 tablet by mouth 3 (three) times a week.  (Patient not taking: Reported on 05/20/2020), Disp: , Rfl:  .  Calcium Carb-Cholecalciferol (CALCIUM 600+D3 PO), Take 1 tablet by mouth 3 (three) times a week.  (Patient not taking: Reported on 05/20/2020), Disp: , Rfl:  .  chlorpheniramine-HYDROcodone (TUSSIONEX) 10-8 MG/5ML SUER, Take 5 mLs by mouth every 12 (twelve) hours as needed for cough (prn cough). (Patient not taking: Reported on 05/20/2020), Disp: 140 mL, Rfl: 0 .  Darbepoetin Alfa (ARANESP) 500 MCG/ML SOSY injection, Inject 500 mcg into the skin See admin instructions. Inject 500 mcg into the skin every other week, Disp: , Rfl:  .  ipratropium-albuterol (DUONEB) 0.5-2.5 (3) MG/3ML  SOLN, Take 3 mLs by nebulization every 6 (six) hours as needed., Disp: 360 mL, Rfl: 0 .  lenalidomide (REVLIMID) 10 MG capsule, Take 1 capsule (10 mg total) by mouth daily. (Patient not taking: Reported on 05/20/2020), Disp: 28 capsule, Rfl: 0 .  levothyroxine (SYNTHROID) 25 MCG tablet, Take 1 tablet (25 mcg total) by mouth daily before breakfast. (Patient not taking: Reported on 05/22/2020), Disp: 90 tablet, Rfl: 3 .  ondansetron (ZOFRAN ODT) 8 MG disintegrating tablet, Take 1 tablet (8 mg total) by mouth every 8 (eight) hours as needed for nausea or vomiting. (Patient not taking: Reported on 05/22/2020), Disp: 30 tablet, Rfl: 0  Physical exam:  Vitals:   05/22/20 0855  BP: (!) 127/57  Pulse: 64  Resp: 17  Temp: (!) 97.2 F (36.2 C)  TempSrc: Oral  SpO2: 98%  Weight: 160 lb 3.2 oz (72.7 kg)   Physical Exam Constitutional:      General: She is not in acute distress.    Appearance: She is not ill-appearing.  Cardiovascular:     Rate and Rhythm: Normal rate and regular rhythm.  Pulmonary:     Effort: Pulmonary effort is normal.     Breath sounds: Normal breath sounds.  Abdominal:     General: Abdomen is flat.     Palpations: Abdomen is soft.     Tenderness: There is no abdominal tenderness.  Musculoskeletal:     Right lower leg: Edema present.     Left lower leg: Edema present.  Skin:    General: Skin is warm and dry.  Neurological:     Mental Status: She is alert and oriented to person, place, and time.  Psychiatric:        Mood and Affect: Mood normal.        Behavior: Behavior normal.      CMP Latest Ref Rng & Units 05/22/2020  Glucose 70 - 99 mg/dL 120(H)  BUN 8 - 23 mg/dL 40(H)  Creatinine 0.44 - 1.00 mg/dL 1.16(H)  Sodium 135 - 145 mmol/L 135  Potassium 3.5 - 5.1 mmol/L 3.3(L)  Chloride 98 - 111 mmol/L 101  CO2 22 - 32 mmol/L 23  Calcium 8.9 - 10.3 mg/dL 8.8(L)  Total Protein 6.5 - 8.1 g/dL -  Total Bilirubin 0.3 - 1.2 mg/dL -  Alkaline Phos 38 - 126 U/L -  AST  15 - 41 U/L -  ALT 0 - 44 U/L -   CBC Latest Ref Rng & Units 05/22/2020  WBC 4.0 - 10.5 K/uL 13.0(H)  Hemoglobin  12.0 - 15.0 g/dL 9.1(L)  Hematocrit 36 - 46 % 26.7(L)  Platelets 150 - 400 K/uL 188    No images are attached to the encounter.  DG Chest 2 View  Result Date: 05/17/2020 CLINICAL DATA:  69 year old female with history of chest pain and shortness of breath. Dry cough for a few days. EXAM: CHEST - 2 VIEW COMPARISON:  No priors. FINDINGS: Areas of interstitial prominence and some ill-defined opacities are noted in the lower lungs bilaterally, concerning for bronchitis and possible developing multilobar bronchopneumonia. Small bilateral pleural effusions. No evidence of pulmonary edema. No pneumothorax. Heart size is normal. Upper mediastinal contours are within normal limits. IMPRESSION: 1. The appearance the chest suggests bronchitis with developing multilobar bronchopneumonia. 2. Small bilateral pleural effusions. Electronically Signed   By: Vinnie Langton M.D.   On: 05/17/2020 11:21   CT Angio Chest PE W and/or Wo Contrast  Result Date: 05/17/2020 CLINICAL DATA:  Shortness of breath and cough with chest pain. EXAM: CT ANGIOGRAPHY CHEST WITH CONTRAST TECHNIQUE: Multidetector CT imaging of the chest was performed using the standard protocol during bolus administration of intravenous contrast. Multiplanar CT image reconstructions and MIPs were obtained to evaluate the vascular anatomy. CONTRAST:  53m OMNIPAQUE IOHEXOL 350 MG/ML SOLN COMPARISON:  None. FINDINGS: Cardiovascular: Satisfactory opacification of the pulmonary arteries to the segmental level. No evidence of pulmonary embolism. Normal heart size. No pericardial effusion. Mediastinum/Nodes: There is mild right hilar lymphadenopathy. Thyroid gland, trachea, and esophagus demonstrate no significant findings. Lungs/Pleura: Mild linear scarring and/or atelectasis is seen within the inferior aspect of the left upper lobe and posterior  aspect of the bilateral lower lobes. A trace amount of pleural fluid is seen, bilaterally. There is no evidence of pneumothorax. Upper Abdomen: No acute abnormality. Musculoskeletal: No chest wall abnormality. No acute or significant osseous findings. Review of the MIP images confirms the above findings. IMPRESSION: 1. No evidence of pulmonary embolism. 2. Trace amount of pleural fluid, bilaterally. 3. Mild linear scarring and/or atelectasis within the inferior aspect of the left upper lobe and posterior aspect of the bilateral lower lobes. Electronically Signed   By: TVirgina NorfolkM.D.   On: 05/17/2020 16:41    Assessment and plan- Patient is a 69y.o. female diagnosed with MDS who presents to symptom management clinic for follow-up for viral bronchopneumonia and symptomatic anemia.  1.  Viral bronchopneumonia-symptoms improving.  She has not received nebulizer.  Will follow up and have delivered to her home.  Continue steroids as prescribed.  Continue Delsym for cough and Mucinex as needed.   2.  MDS-continue to hold Revlimid.  Hemoglobin improved to 9.1 posttransfusion.  Continue to monitor.  3.  Dehydration-resolved.  Blood pressure is improved.  Tolerating oral intake.   4.  Nausea-resolved.  Continue Zofran as prescribed  5.  Leg swelling-likely related to acute debility.  Will give pair of compression stockings with instructions to elevate her legs when sedentary and begin increasing physical activity as tolerated.  We will also send prescription for Lasix 20 mg orally in the morning as needed for swelling.  6.  Hypokalemia-potassium slightly low at 3.3.  Will send prescription for potassium 10 mEq daily.  7.  Leukocytosis-White count elevated at 13.  Consistent with viral infection.  Continue to monitor.  Return to clinic if symptoms do not improve or worsen in the interim.  Otherwise follow-up with Dr. BRogue Bussingfor re-evaluation and reconsideration of revlimid.   Visit  Diagnosis 1. Acute viral bronchitis  2. MDS (myelodysplastic syndrome), low grade ([facility])   3. Hypokalemia   4. Leg swelling     Patient expressed understanding and was in agreement with this plan. She also understands that She can call clinic at any time with any questions, concerns, or complaints.   Thank you for allowing me to participate in the care of this very pleasant patient.   Beckey Rutter, DNP, AGNP-[facility]

## 2020-05-22 NOTE — Progress Notes (Signed)
Pt being seen by [facility] for poss blood today. Pt looks better and feels better today. She is not requiring the wheelchair. Her nebulizer was never delivered to the house.Buttock pain is better, still there not as bad.

## 2020-05-23 ENCOUNTER — Telehealth: Payer: Self-pay | Admitting: *Deleted

## 2020-05-23 DIAGNOSIS — J180 Bronchopneumonia, unspecified organism: Secondary | ICD-10-CM | POA: Diagnosis not present

## 2020-05-23 NOTE — Telephone Encounter (Signed)
Dr. Jacinto Reap- Revlimid is on hold correct?

## 2020-05-23 NOTE — Telephone Encounter (Signed)
Returned [facility] phone call. Spoke with representative. [facility] informed that patient's revlimid is still on hold due to patient's acute bronchitis.

## 2020-05-23 NOTE — Telephone Encounter (Signed)
HOLD revlimid for now-GB

## 2020-05-23 NOTE — Telephone Encounter (Signed)
[facility] called reporting that they have not been able to reach patient for delivery of her Revlimid

## 2020-05-27 ENCOUNTER — Inpatient Hospital Stay: Payer: PPO

## 2020-05-27 ENCOUNTER — Other Ambulatory Visit: Payer: Self-pay

## 2020-05-27 ENCOUNTER — Encounter: Payer: Self-pay | Admitting: Internal Medicine

## 2020-05-27 ENCOUNTER — Inpatient Hospital Stay (HOSPITAL_BASED_OUTPATIENT_CLINIC_OR_DEPARTMENT_OTHER): Payer: PPO | Admitting: Internal Medicine

## 2020-05-27 VITALS — BP 129/62 | HR 81 | Temp 98.3°F | Resp 16 | Ht 64.0 in | Wt 160.0 lb

## 2020-05-27 DIAGNOSIS — D4620 Refractory anemia with excess of blasts, unspecified: Secondary | ICD-10-CM

## 2020-05-27 DIAGNOSIS — I50811 Acute right heart failure: Secondary | ICD-10-CM | POA: Diagnosis not present

## 2020-05-27 DIAGNOSIS — D649 Anemia, unspecified: Secondary | ICD-10-CM

## 2020-05-27 LAB — CBC WITH DIFFERENTIAL/PLATELET
Abs Immature Granulocytes: 0.47 10*3/uL — ABNORMAL HIGH (ref 0.00–0.07)
Basophils Absolute: 0.1 10*3/uL (ref 0.0–0.1)
Basophils Relative: 1 %
Eosinophils Absolute: 0.1 10*3/uL (ref 0.0–0.5)
Eosinophils Relative: 1 %
HCT: 34.2 % — ABNORMAL LOW (ref 36.0–46.0)
Hemoglobin: 11 g/dL — ABNORMAL LOW (ref 12.0–15.0)
Immature Granulocytes: 5 %
Lymphocytes Relative: 12 %
Lymphs Abs: 1 10*3/uL (ref 0.7–4.0)
MCH: 30.8 pg (ref 26.0–34.0)
MCHC: 32.2 g/dL (ref 30.0–36.0)
MCV: 95.8 fL (ref 80.0–100.0)
Monocytes Absolute: 1.8 10*3/uL — ABNORMAL HIGH (ref 0.1–1.0)
Monocytes Relative: 20 %
Neutro Abs: 5.4 10*3/uL (ref 1.7–7.7)
Neutrophils Relative %: 61 %
Platelets: 226 10*3/uL (ref 150–400)
RBC: 3.57 MIL/uL — ABNORMAL LOW (ref 3.87–5.11)
RDW: 25 % — ABNORMAL HIGH (ref 11.5–15.5)
WBC: 8.8 10*3/uL (ref 4.0–10.5)
nRBC: 0.5 % — ABNORMAL HIGH (ref 0.0–0.2)

## 2020-05-27 NOTE — Assessment & Plan Note (Addendum)
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  Poor response to Aranesp; Continue Aranesp 500 mcg weekly subcu injections; also currently on lenalidomide 10 mg a day-tolerating fairly well except mild side effects [diarrhea/scalp which see below].   #Today hemoglobin 11 likely secondary to recent transfusion.  Hold Retacrit.  Monitor for now.  Restart Revlimid.  # Diarrhea-G-2 x 2 episodes- ? revlimid- currently improved.  Monitor closely.  # LE swelling-clinically no DVT.  No obvious evidence of CHF.  Check 2D echo.  # DISPOSITION:  # HOLD Aranesp today # 2 D Echo-1-2 weeks # in 1 week- cbc/CMP;hold tube; possible aranesp # follow up in 2 weeks- MD; labs- cbc/Cmp;LDH;HOLD tube;possible; Aranesp SQ- Dr.B

## 2020-05-27 NOTE — Patient Instructions (Signed)
#   Re-start revlimid  # vall to refill revlimid script

## 2020-05-27 NOTE — Progress Notes (Signed)
[facility] CONSULT NOTE  Patient Care Team: [name], [name], MD as PCP - General Rogue Bussing, [name], MD as Consulting Physician (Hematology and Oncology)  CHIEF COMPLAINTS/PURPOSE OF CONSULTATION: Anemia   Oncology History Overview Note   # July 2020-myelodysplastic syndrome with ringed sideroblasts-Normal karyotype; no blasts [IPSS R-Very low risk ~median survival 8.3 years]; iron studies L84 folic acid myeloma panel normal; pyridoxine levels/copper/zinc-WNL. Erythropoietin levels-60. 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  female history of low-grade MDS anemia with ring sideroblasts currently on Aranesp/Revlimid is here for follow-up.  Patient was recently admitted to hospital for worsening shortness of breath/bronchitis.  Needed blood transfusion for hemoglobin less than 8.  Revlimid on hold because of recent admission to hospital.  Complains of swelling in the legs.  Shortness of breath on exertion.  Cough improved.  Review of Systems  Constitutional: Positive for malaise/fatigue. Negative for chills, diaphoresis and fever.  HENT: Negative for nosebleeds and sore throat.   Eyes: Negative for double vision.  Respiratory: Positive for  shortness of breath. Negative for cough, hemoptysis, sputum production and wheezing.   Cardiovascular: Positive for leg swelling. Negative for chest pain and orthopnea.  Gastrointestinal: Negative for abdominal pain, blood in stool, constipation, diarrhea, heartburn, melena and vomiting.  Genitourinary: Negative for dysuria, frequency and urgency.  Musculoskeletal: Positive for back pain and joint pain.  Skin: Negative.  Negative for itching and rash.  Neurological: Negative for dizziness, tingling, focal weakness, weakness and headaches.  Endo/Heme/Allergies: Does not bruise/bleed easily.  Psychiatric/Behavioral: Negative for depression. The patient is not nervous/anxious and does not have insomnia.     MEDICAL HISTORY:  Past Medical History:  Diagnosis Date  . Allergic rhinitis, cause unspecified   . Anemia, unspecified   . Carpal tunnel syndrome   . Cystitis, unspecified   . Family history of osteoporosis   . PONV (postoperative nausea and vomiting)   . Postnasal drip   . Syncope and collapse     SURGICAL HISTORY: Past Surgical History:  Procedure Laterality Date  . CARPAL TUNNEL RELEASE    . COLONOSCOPY  2005  . COLONOSCOPY WITH PROPOFOL N/A 07/31/2015   Procedure: COLONOSCOPY WITH PROPOFOL;  Surgeon: RRonald Lobo MD;  Location: [facility];  Service: Endoscopy;  Laterality: N/A;  . DIAGNOSTIC LAPAROSCOPY     dx lack of pregnancy   . EYE SURGERY     lasik, cataract, prk,yag procedure    SOCIAL HISTORY: Social History   Socioeconomic History  . Marital status: Married    Spouse name: Not on file  . Number of children: Not on file  . Years of education: Not on file  . Highest education level: Not on file  Occupational History  . Not on file  Tobacco Use  . Smoking status: Never Smoker  . Smokeless tobacco: Never Used  Vaping  Use  . Vaping Use: Never used  Substance and Sexual Activity  . Alcohol use: Yes    Alcohol/week: 0.0 standard drinks    Comment:  Rare  . Drug use: No  . Sexual activity: Not Currently  Other Topics Concern  . Not on file  Social History Narrative   No regular exercise      Drinks lots of [employer]         Social Determinants of Health   Financial Resource Strain: Low Risk   . Difficulty of Paying Living Expenses: Not hard at all  Food Insecurity: No Food Insecurity  . Worried About Charity fundraiser in the Last Year: Never true  . Ran Out of Food in the Last Year: Never true  Transportation Needs: No Transportation Needs  . Lack of Transportation (Medical): No  . Lack of Transportation (Non-Medical): No  Physical Activity: Inactive  . Days of Exercise per Week: 0 days  . Minutes of Exercise per Session: 0 min  Stress: No Stress Concern Present  . Feeling of Stress : Not at all  Social Connections:   . Frequency of Communication with Friends and Family: Not on file  . Frequency of Social Gatherings with Friends and Family: Not on file  . Attends Religious Services: Not on file  . Active Member of Clubs or Organizations: Not on file  . Attends Archivist Meetings: Not on file  . Marital Status: Not on file  Intimate Partner Violence: Not At Risk  . Fear of Current or Ex-Partner: No  . Emotionally Abused: No  . Physically Abused: No  . Sexually Abused: No    FAMILY HISTORY: Family History  Problem Relation Age of Onset  . Osteoporosis Mother   . Stroke Mother   . Coronary artery disease Father   . Stroke Father 18  . Diabetes Other        Aunts and uncles  . Breast cancer Paternal Aunt   . Breast cancer Maternal Aunt   . Arthritis/Rheumatoid Child   . Breast cancer Cousin     ALLERGIES:  is allergic to codeine.  MEDICATIONS:  Current Outpatient Medications  Medication Sig Dispense Refill  . acetaminophen (TYLENOL) 500 MG tablet Take 500-1,000 mg by mouth every 6 (six) hours as needed for mild pain or headache.    . albuterol (VENTOLIN HFA) 108 (90 Base) MCG/ACT inhaler Inhale  2 puffs into the lungs every 6 (six) hours as needed for wheezing or shortness of breath. (Patient not taking: Reported on 06/24/2020) 8 g 2  . Darbepoetin Alfa (ARANESP) 500 MCG/ML SOSY injection Inject 500 mcg into the skin See admin instructions. Inject 500 mcg into the skin every other week    . dextromethorphan (DELSYM) 30 MG/5ML liquid Take 15-30 mg by mouth every 12 (twelve) hours as needed for cough.     . furosemide (LASIX) 20 MG tablet Take 1 tablet (20 mg total) by mouth daily as needed for fluid or edema. 15 tablet 0  . ipratropium-albuterol (DUONEB) 0.5-2.5 (3) MG/3ML SOLN Take 3 mLs by nebulization every 6 (six) hours as needed. 360 mL 0  . levothyroxine (SYNTHROID) 25 MCG tablet Take 1 tablet (25 mcg total) by mouth daily before breakfast. 90 tablet 3  . loratadine (CLARITIN) 10 MG tablet Take 10 mg by mouth daily. (Patient not taking: Reported on 06/09/2020)    . ondansetron (ZOFRAN ODT) 8 MG disintegrating tablet Take 1 tablet (8 mg total) by mouth every 8 (  eight) hours as needed for nausea or vomiting. 30 tablet 0  . potassium chloride (KLOR-CON) 10 MEQ tablet Take 1 tablet (10 mEq total) by mouth daily. (Patient not taking: Reported on 06/24/2020) 14 tablet 0  . predniSONE (DELTASONE) 10 MG tablet Take 1 tablet (10 mg total) by mouth daily with breakfast. 7 tablet 0  . sodium chloride (OCEAN) 0.65 % SOLN nasal spray Place 1 spray into both nostrils as needed for congestion.    . Sulfacetamide Sodium, Acne, 10 % LOTN Apply 1 application topically See admin instructions. Apply to face once a day as directed    . aspirin EC 81 MG tablet Take 81 mg by mouth at bedtime. Swallow whole.     Marland Kitchen BIOTIN PO Take 1 tablet by mouth 3 (three) times a week.     . Calcium Carb-Cholecalciferol (CALCIUM 600+D3 PO) Take 1 tablet by mouth 3 (three) times a week.     . cetirizine (ZYRTEC) 10 MG tablet Take 10 mg by mouth daily. (Patient not taking: Reported on 06/24/2020)    . lenalidomide (REVLIMID) 10 MG  capsule Take 1 capsule (10 mg total) by mouth daily. 3 weeks-On and 1 week-OFF. 21 capsule 0  . lenalidomide (REVLIMID) 10 MG capsule Take 1 capsule (10 mg total) by mouth daily. 3 weeks-On and 1 week-OFF. 21 capsule 0  . montelukast (SINGULAIR) 10 MG tablet Take 1 tablet (10 mg total) by mouth at bedtime. For allergies. 30 tablet 1   No current facility-administered medications for this visit.      PHYSICAL EXAMINATION:   Vitals:   05/27/20 1435  BP: 129/62  Pulse: 81  Resp: 16  Temp: 98.3 F (36.8 C)  SpO2: 97%   Filed Weights   05/27/20 1435  Weight: 160 lb (72.6 kg)    Physical Exam Constitutional:      Comments: Appears pale. Walk independently.  Alone.  HENT:     Head: Normocephalic and atraumatic.     Mouth/Throat:     Pharynx: No oropharyngeal exudate.  Eyes:     Pupils: Pupils are equal, round, and reactive to light.  Cardiovascular:     Rate and Rhythm: Normal rate and regular rhythm.     Pulses: Normal pulses.     Heart sounds: Normal heart sounds.  Pulmonary:     Effort: Pulmonary effort is normal. No respiratory distress.     Breath sounds: Normal breath sounds. No wheezing.  Abdominal:     General: Bowel sounds are normal. There is no distension.     Palpations: Abdomen is soft. There is no mass.     Tenderness: There is no abdominal tenderness. There is no guarding or rebound.  Musculoskeletal:        General: No tenderness. Normal range of motion.     Cervical back: Normal range of motion and neck supple.  Skin:    General: Skin is warm.     Coloration: Skin is pale.  Neurological:     Mental Status: She is alert and oriented to person, place, and time.  Psychiatric:        Mood and Affect: Affect normal.     LABORATORY DATA:  I have reviewed the data as listed Lab Results  Component Value Date   WBC 4.5 06/24/2020   HGB 10.5 (L) 06/24/2020   HCT 31.8 (L) 06/24/2020   MCV 98.1 06/24/2020   PLT 338 06/24/2020   Recent Labs     05/20/20 0839 05/22/20 0829 06/03/20  1351 06/17/20 1516 06/24/20 1504  NA 127*   < > 137 138 140  K 3.8   < > 4.3 3.6 3.5  CL 96*   < > 104 104 105  CO2 21*   < > '26 27 27  ' GLUCOSE 114*   < > 113* 134* 101*  BUN 29*   < > '15 13 8  ' CREATININE 1.23*   < > 1.10* 0.95 1.08*  CALCIUM 8.2*   < > 8.2* 8.5* 8.6*  GFRNONAA 45*   < > 51* >60 52*  GFRAA 52*   < > 59* >60 >60  PROT 7.6  --  7.3 7.7 7.7  ALBUMIN 3.3*  --  3.5 3.9 4.1  AST 86*  --  '19 17 18  ' ALT 57*  --  '20 15 15  ' ALKPHOS 32*  --  43 51 50  BILITOT 1.7*  --  1.6* 1.0 1.1  BILIDIR 0.5*  --   --   --   --   IBILI 1.2*  --   --   --   --    < > = values in this interval not displayed.     ECHOCARDIOGRAM COMPLETE  Result Date: 06/06/2020    ECHOCARDIOGRAM REPORT   Patient Name:   Carla [name] Date of Exam: 06/06/2020 Medical Rec #:  [id number]   Height:       64.0 in Accession #:    [id number]  Weight:       160.0 lb Date of Birth:  05/31/1951   BSA:          1.779 m Patient Age:    28 years    BP:           106/66 mmHg Patient Gender: F           HR:           88 bpm. Exam Location:  [facility] Procedure: 2D Echo, Cardiac Doppler and Color Doppler Indications:     Cardiomyopathy-Unspecified 425.9  History:         Patient has no prior history of Echocardiogram examinations.                  History of syncope.  Sonographer:     Sherrie Sport RDCS (AE) Referring Phys:  [id number] Cammie Sickle Diagnosing Phys: Ida Rogue MD IMPRESSIONS  1. Left ventricular ejection fraction, by estimation, is 60 to 65%. The left ventricle has normal function. The left ventricle has no regional wall motion abnormalities. Left ventricular diastolic parameters are consistent with Grade I diastolic dysfunction (impaired relaxation).  2. Right ventricular systolic function is normal. The right ventricular size is normal. There is normal pulmonary artery systolic pressure. The estimated right ventricular systolic pressure is 86.8 mmHg. FINDINGS  Left Ventricle:  Left ventricular ejection fraction, by estimation, is 60 to 65%. The left ventricle has normal function. The left ventricle has no regional wall motion abnormalities. The left ventricular internal cavity size was normal in size. There is  no left ventricular hypertrophy. Left ventricular diastolic parameters are consistent with Grade I diastolic dysfunction (impaired relaxation). Right Ventricle: The right ventricular size is normal. No increase in right ventricular wall thickness. Right ventricular systolic function is normal. There is normal pulmonary artery systolic pressure. The tricuspid regurgitant velocity is 2.55 m/s, and  with an assumed right atrial pressure of 5 mmHg, the estimated right ventricular systolic pressure is 25.7 mmHg. Left Atrium: Left atrial size was normal  in size. Right Atrium: Right atrial size was normal in size. Pericardium: There is no evidence of pericardial effusion. Mitral Valve: The mitral valve is normal in structure. No evidence of mitral valve regurgitation. No evidence of mitral valve stenosis. Tricuspid Valve: The tricuspid valve is normal in structure. Tricuspid valve regurgitation is mild . No evidence of tricuspid stenosis. Aortic Valve: The aortic valve was not assessed. Aortic valve regurgitation is not visualized. No aortic stenosis is present. Aortic valve mean gradient measures 4.0 mmHg. Aortic valve peak gradient measures 8.8 mmHg. Aortic valve area, by VTI measures 2.19 cm. Pulmonic Valve: The pulmonic valve was not assessed. Pulmonic valve regurgitation is not visualized. No evidence of pulmonic stenosis. Aorta: The aortic root is normal in size and structure. Venous: The inferior vena cava is normal in size with greater than 50% respiratory variability, suggesting right atrial pressure of 3 mmHg. IAS/Shunts: No atrial level shunt detected by color flow Doppler.  LEFT VENTRICLE PLAX 2D LVIDd:         3.86 cm  Diastology LVIDs:         2.48 cm  LV e' medial:    7.07  cm/s LV PW:         1.07 cm  LV E/e' medial:  12.3 LV IVS:        0.80 cm  LV e' lateral:   9.25 cm/s LVOT diam:     2.00 cm  LV E/e' lateral: 9.4 LV SV:         58 LV SV Index:   32 LVOT Area:     3.14 cm  RIGHT VENTRICLE RV Basal diam:  2.87 cm RV S prime:     12.50 cm/s TAPSE (M-mode): 3.5 cm LEFT ATRIUM             Index       RIGHT ATRIUM           Index LA diam:        2.70 cm 1.52 cm/m  RA Area:     16.00 cm LA Vol (A2C):   46.0 ml 25.85 ml/m RA Volume:   41.00 ml  23.04 ml/m LA Vol (A4C):   22.4 ml 12.59 ml/m LA Biplane Vol: 33.1 ml 18.60 ml/m  AORTIC VALVE                   PULMONIC VALVE AV Area (Vmax):    2.08 cm    PV Vmax:        0.83 m/s AV Area (Vmean):   2.08 cm    PV Peak grad:   2.8 mmHg AV Area (VTI):     2.19 cm    RVOT Peak grad: 4 mmHg AV Vmax:           148.00 cm/s AV Vmean:          95.400 cm/s AV VTI:            0.264 m AV Peak Grad:      8.8 mmHg AV Mean Grad:      4.0 mmHg LVOT Vmax:         97.90 cm/s LVOT Vmean:        63.200 cm/s LVOT VTI:          0.184 m LVOT/AV VTI ratio: 0.70  AORTA Ao Root diam: 2.40 cm MITRAL VALVE               TRICUSPID VALVE MV Area (PHT): 4.21 cm  TR Peak grad:   26.0 mmHg MV Decel Time: 180 msec    TR Vmax:        255.00 cm/s MV E velocity: 87.00 cm/s MV A velocity: 87.00 cm/s  SHUNTS MV E/A ratio:  1.00        Systemic VTI:  0.18 m                            Systemic Diam: 2.00 cm Ida Rogue MD Electronically signed by Ida Rogue MD Signature Date/Time: 06/06/2020/5:18:43 PM    Final     MDS (myelodysplastic syndrome), low grade ([place]) # Low grade MDS- with ringed sideroblasts; no blasts. POSITIVE  For SF3B1; R-IPSS-low risk.  Poor response to Aranesp; Continue Aranesp 500 mcg weekly subcu injections; also currently on lenalidomide 10 mg a day-tolerating fairly well except mild side effects [diarrhea/scalp which see below].   #Today hemoglobin 11 likely secondary to recent transfusion.  Hold Retacrit.  Monitor for now.  Restart  Revlimid.  # Diarrhea-G-2 x 2 episodes- ? revlimid- currently improved.  Monitor closely.  # LE swelling-clinically no DVT.  No obvious evidence of CHF.  Check 2D echo.  # DISPOSITION:  # HOLD Aranesp today # 2 D Echo-1-2 weeks # in 1 week- cbc/CMP;hold tube; possible aranesp # follow up in 2 weeks- MD; labs- cbc/Cmp;LDH;HOLD tube;possible; Aranesp SQ- Dr.B  All questions were answered. The patient knows to call the clinic with any problems, questions or concerns.   Cammie Sickle, MD 06/29/2020 9:13 AM

## 2020-05-29 ENCOUNTER — Other Ambulatory Visit: Payer: Self-pay

## 2020-05-29 DIAGNOSIS — D4620 Refractory anemia with excess of blasts, unspecified: Secondary | ICD-10-CM

## 2020-05-29 MED ORDER — LENALIDOMIDE 10 MG PO CAPS: 10.0000 mg | ORAL_CAPSULE | Freq: Every day | ORAL | 0 refills | Status: DC

## 2020-05-29 NOTE — Telephone Encounter (Signed)
Per recent OV notes on 05/27/20, Dr. B has restarted revlimid. [facility] rx [facility] to [facility].

## 2020-05-30 ENCOUNTER — Other Ambulatory Visit: Payer: Self-pay | Admitting: *Deleted

## 2020-05-30 ENCOUNTER — Telehealth: Payer: Self-pay | Admitting: *Deleted

## 2020-05-30 DIAGNOSIS — D4620 Refractory anemia with excess of blasts, unspecified: Secondary | ICD-10-CM

## 2020-05-30 MED ORDER — LENALIDOMIDE 10 MG PO CAPS: 10.0000 mg | ORAL_CAPSULE | Freq: Every day | ORAL | 0 refills | Status: DC

## 2020-05-30 NOTE — Telephone Encounter (Signed)
[facility] contacted. The pharmacy still did not receive the prescription. I explained that the script was faxed yesterday by Lovena Le, who even called [facility] prior to sending the prescription. Reviewed chart. [facility] program Auth number was still good from 05/14/2020.  Since [facility] did not receive scripts, revlimid script refaxed. Patient aware that new script was faxed.

## 2020-05-30 NOTE — Telephone Encounter (Signed)
Patient called regarding her refill for Revlimid. She states that she has restarted it but only has 5 tabs left. She contacted [facility] and they told her that the [facility] would arrange for delivery.  Please advise patient of her prescription status.

## 2020-05-30 NOTE — Telephone Encounter (Signed)
Pharmacy called requesting a prescription for her Revlimid. Prescription can be faxed to [phone] or called to contact name.

## 2020-05-30 NOTE — Telephone Encounter (Signed)
Script was faxed.

## 2020-06-03 ENCOUNTER — Inpatient Hospital Stay: Payer: PPO | Attending: Internal Medicine

## 2020-06-03 ENCOUNTER — Other Ambulatory Visit: Payer: Self-pay

## 2020-06-03 ENCOUNTER — Inpatient Hospital Stay: Payer: PPO

## 2020-06-03 VITALS — BP 106/66 | HR 88

## 2020-06-03 DIAGNOSIS — D4620 Refractory anemia with excess of blasts, unspecified: Secondary | ICD-10-CM

## 2020-06-03 DIAGNOSIS — D649 Anemia, unspecified: Secondary | ICD-10-CM

## 2020-06-03 DIAGNOSIS — Z23 Encounter for immunization: Secondary | ICD-10-CM | POA: Diagnosis not present

## 2020-06-03 DIAGNOSIS — I50811 Acute right heart failure: Secondary | ICD-10-CM

## 2020-06-03 LAB — CBC WITH DIFFERENTIAL/PLATELET
Abs Immature Granulocytes: 0.04 10*3/uL (ref 0.00–0.07)
Basophils Absolute: 0.1 10*3/uL (ref 0.0–0.1)
Basophils Relative: 2 %
Eosinophils Absolute: 0.2 10*3/uL (ref 0.0–0.5)
Eosinophils Relative: 5 %
HCT: 30.9 % — ABNORMAL LOW (ref 36.0–46.0)
Hemoglobin: 9.9 g/dL — ABNORMAL LOW (ref 12.0–15.0)
Immature Granulocytes: 1 %
Lymphocytes Relative: 29 %
Lymphs Abs: 1.5 10*3/uL (ref 0.7–4.0)
MCH: 30.5 pg (ref 26.0–34.0)
MCHC: 32 g/dL (ref 30.0–36.0)
MCV: 95.1 fL (ref 80.0–100.0)
Monocytes Absolute: 1.3 10*3/uL — ABNORMAL HIGH (ref 0.1–1.0)
Monocytes Relative: 25 %
Neutro Abs: 1.9 10*3/uL (ref 1.7–7.7)
Neutrophils Relative %: 38 %
Platelets: 228 10*3/uL (ref 150–400)
RBC: 3.25 MIL/uL — ABNORMAL LOW (ref 3.87–5.11)
RDW: 23.4 % — ABNORMAL HIGH (ref 11.5–15.5)
WBC: 5.1 10*3/uL (ref 4.0–10.5)
nRBC: 0 % (ref 0.0–0.2)

## 2020-06-03 LAB — COMPREHENSIVE METABOLIC PANEL
ALT: 20 U/L (ref 0–44)
AST: 19 U/L (ref 15–41)
Albumin: 3.5 g/dL (ref 3.5–5.0)
Alkaline Phosphatase: 43 U/L (ref 38–126)
Anion gap: 7 (ref 5–15)
BUN: 15 mg/dL (ref 8–23)
CO2: 26 mmol/L (ref 22–32)
Calcium: 8.2 mg/dL — ABNORMAL LOW (ref 8.9–10.3)
Chloride: 104 mmol/L (ref 98–111)
Creatinine, Ser: 1.1 mg/dL — ABNORMAL HIGH (ref 0.44–1.00)
GFR calc Af Amer: 59 mL/min — ABNORMAL LOW (ref >60)
GFR calc non Af Amer: 51 mL/min — ABNORMAL LOW (ref >60)
Glucose, Bld: 113 mg/dL — ABNORMAL HIGH (ref 70–99)
Potassium: 4.3 mmol/L (ref 3.5–5.1)
Sodium: 137 mmol/L (ref 135–145)
Total Bilirubin: 1.6 mg/dL — ABNORMAL HIGH (ref 0.3–1.2)
Total Protein: 7.3 g/dL (ref 6.5–8.1)

## 2020-06-03 MED ORDER — DARBEPOETIN ALFA 500 MCG/ML IJ SOSY
500.0000 ug | PREFILLED_SYRINGE | Freq: Once | INTRAMUSCULAR | Status: AC
  Administered 2020-06-03: 500 ug via SUBCUTANEOUS
  Filled 2020-06-03: qty 1

## 2020-06-04 ENCOUNTER — Ambulatory Visit: Admission: RE | Admit: 2020-06-04 | Payer: PPO | Source: Ambulatory Visit

## 2020-06-04 LAB — SAMPLE TO BLOOD BANK

## 2020-06-06 ENCOUNTER — Other Ambulatory Visit: Payer: Self-pay

## 2020-06-06 ENCOUNTER — Ambulatory Visit
Admission: RE | Admit: 2020-06-06 | Discharge: 2020-06-06 | Disposition: A | Discharge status: Home / Self Care | Payer: PPO | Source: Ambulatory Visit | Attending: Internal Medicine | Admitting: Internal Medicine

## 2020-06-06 DIAGNOSIS — I50811 Acute right heart failure: Secondary | ICD-10-CM | POA: Diagnosis not present

## 2020-06-06 DIAGNOSIS — D4620 Refractory anemia with excess of blasts, unspecified: Secondary | ICD-10-CM

## 2020-06-06 LAB — ECHOCARDIOGRAM COMPLETE
AR max vel: 2.08 cm2
AV Area VTI: 2.19 cm2
AV Area mean vel: 2.08 cm2
AV Mean grad: 4 mmHg
AV Peak grad: 8.8 mmHg
Ao pk vel: 1.48 m/s
Area-P 1/2: 4.21 cm2
S' Lateral: 2.48 cm

## 2020-06-06 NOTE — Progress Notes (Signed)
*  PRELIMINARY RESULTS* Echocardiogram 2D Echocardiogram has been performed.  Sherrie Sport 06/06/2020, 11:50 AM

## 2020-06-10 ENCOUNTER — Inpatient Hospital Stay (HOSPITAL_BASED_OUTPATIENT_CLINIC_OR_DEPARTMENT_OTHER): Payer: PPO | Admitting: Internal Medicine

## 2020-06-10 ENCOUNTER — Other Ambulatory Visit: Payer: Self-pay

## 2020-06-10 ENCOUNTER — Inpatient Hospital Stay: Payer: PPO

## 2020-06-10 DIAGNOSIS — D4620 Refractory anemia with excess of blasts, unspecified: Secondary | ICD-10-CM

## 2020-06-10 DIAGNOSIS — I50811 Acute right heart failure: Secondary | ICD-10-CM

## 2020-06-10 DIAGNOSIS — D649 Anemia, unspecified: Secondary | ICD-10-CM

## 2020-06-10 LAB — CBC WITH DIFFERENTIAL/PLATELET
Abs Immature Granulocytes: 0.05 10*3/uL (ref 0.00–0.07)
Basophils Absolute: 0.1 10*3/uL (ref 0.0–0.1)
Basophils Relative: 2 %
Eosinophils Absolute: 0.5 10*3/uL (ref 0.0–0.5)
Eosinophils Relative: 8 %
HCT: 30.4 % — ABNORMAL LOW (ref 36.0–46.0)
Hemoglobin: 9.8 g/dL — ABNORMAL LOW (ref 12.0–15.0)
Immature Granulocytes: 1 %
Lymphocytes Relative: 22 %
Lymphs Abs: 1.3 10*3/uL (ref 0.7–4.0)
MCH: 30.2 pg (ref 26.0–34.0)
MCHC: 32.2 g/dL (ref 30.0–36.0)
MCV: 93.8 fL (ref 80.0–100.0)
Monocytes Absolute: 1.3 10*3/uL — ABNORMAL HIGH (ref 0.1–1.0)
Monocytes Relative: 24 %
Neutro Abs: 2.5 10*3/uL (ref 1.7–7.7)
Neutrophils Relative %: 43 %
Platelets: 157 10*3/uL (ref 150–400)
RBC: 3.24 MIL/uL — ABNORMAL LOW (ref 3.87–5.11)
RDW: 22.4 % — ABNORMAL HIGH (ref 11.5–15.5)
WBC: 5.7 10*3/uL (ref 4.0–10.5)
nRBC: 0.4 % — ABNORMAL HIGH (ref 0.0–0.2)

## 2020-06-10 LAB — SAMPLE TO BLOOD BANK

## 2020-06-10 LAB — LACTATE DEHYDROGENASE: LDH: 121 U/L (ref 98–192)

## 2020-06-10 MED ORDER — DARBEPOETIN ALFA 500 MCG/ML IJ SOSY
500.0000 ug | PREFILLED_SYRINGE | Freq: Once | INTRAMUSCULAR | Status: AC
  Administered 2020-06-10: 500 ug via SUBCUTANEOUS
  Filled 2020-06-10: qty 1

## 2020-06-10 MED ORDER — MONTELUKAST SODIUM 10 MG PO TABS: 10.0000 mg | ORAL_TABLET | Freq: Every day | ORAL | 1 refills | Status: DC

## 2020-06-10 NOTE — Assessment & Plan Note (Addendum)
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  Poor response to Aranesp; Continue Aranesp 500 mcg weekly subcu injections; also currently on lenalidomide 10 mg a day-tolerating fairly well.  # Hemoglobin is 9.8; continue Aranesp/Revlimid.  White count and platelets are normal..  Monitor closely.  Continue Revlimid.  # Sinus drainage-currently on Claritin.  Recommend adding singulair.  New prescription sent.  # LE swelling-likely secondary to third spacing.  Currently improved.  2D echo no evidence of any concerns for congestive heart failure.  Reviewed the results.  # DISPOSITION:  # proceed with  Aranesp today # in 1 week- H&H-hold tube; possible aranesp # follow up in 2 weeks- MD; labs- cbc/Cmp;LDH;HOLD tube;possible; Aranesp SQ- Dr.B

## 2020-06-10 NOTE — Progress Notes (Signed)
[facility] CONSULT NOTE  Patient Care Team: [name], [name], MD as PCP - General Rogue Bussing, [name], MD as Consulting Physician (Hematology and Oncology)  CHIEF COMPLAINTS/PURPOSE OF CONSULTATION: Anemia   Oncology History Overview Note   # July 2020-myelodysplastic syndrome with ringed sideroblasts-Normal karyotype; no blasts [IPSS R-Very low risk ~median survival 8.3 years]; iron studies Y69 folic acid myeloma panel normal; pyridoxine levels/copper/zinc-WNL. Erythropoietin levels-60. 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  female history of low-grade MDS anemia with ring sideroblasts currently on Aranesp/Revlimid is here for follow-up.  In the interim patient had a 2D echo.  Continues to have mild to moderate fatigue.  Denies any worsening shortness of breath on exertion.  Denies any worsening swelling the legs.  No worsening diarrhea.  Review of Systems  Constitutional: Positive for malaise/fatigue. Negative for chills, diaphoresis and fever.  HENT: Negative for nosebleeds and sore throat.   Eyes: Negative for double vision.  Respiratory: Negative for cough, hemoptysis, sputum production, shortness of breath and wheezing.    Cardiovascular: Negative for chest pain and orthopnea.  Gastrointestinal: Negative for abdominal pain, blood in stool, constipation, diarrhea, heartburn, melena and vomiting.  Genitourinary: Negative for dysuria, frequency and urgency.  Musculoskeletal: Positive for back pain and joint pain.  Skin: Negative.  Negative for itching and rash.  Neurological: Negative for dizziness, tingling, focal weakness, weakness and headaches.  Endo/Heme/Allergies: Does not bruise/bleed easily.  Psychiatric/Behavioral: Negative for depression. The patient is not nervous/anxious and does not have insomnia.     MEDICAL HISTORY:  Past Medical History:  Diagnosis Date  . Allergic rhinitis, cause unspecified   . Anemia, unspecified   . Carpal tunnel syndrome   . Cystitis, unspecified   . Family history of osteoporosis   . PONV (postoperative nausea and vomiting)   . Postnasal drip   . Syncope and collapse     SURGICAL HISTORY: Past Surgical History:  Procedure Laterality Date  . CARPAL TUNNEL RELEASE    . COLONOSCOPY  2005  . COLONOSCOPY WITH PROPOFOL N/A 07/31/2015   Procedure: COLONOSCOPY WITH PROPOFOL;  Surgeon: RRonald Lobo MD;  Location: [facility];  Service: Endoscopy;  Laterality: N/A;  . DIAGNOSTIC LAPAROSCOPY     dx lack of pregnancy   . EYE SURGERY     lasik, cataract, prk,yag procedure    SOCIAL HISTORY: Social History   Socioeconomic History  . Marital status: Married    Spouse name: Not on file  . Number of children: Not on file  . Years of education: Not on file  . Highest education level: Not on file  Occupational History  . Not on file  Tobacco Use  . Smoking status: Never Smoker  . Smokeless tobacco: Never Used  Vaping Use  . Vaping Use: Never used  Substance and Sexual Activity  . Alcohol use:  Yes    Alcohol/week: 0.0 standard drinks    Comment: Rare  . Drug use: No  . Sexual activity: Not Currently  Other Topics Concern  . Not on file  Social History  Narrative   No regular exercise      Drinks lots of [employer]         Social Determinants of Health   Financial Resource Strain: Low Risk   . Difficulty of Paying Living Expenses: Not hard at all  Food Insecurity: No Food Insecurity  . Worried About Charity fundraiser in the Last Year: Never true  . Ran Out of Food in the Last Year: Never true  Transportation Needs: No Transportation Needs  . Lack of Transportation (Medical): No  . Lack of Transportation (Non-Medical): No  Physical Activity: Inactive  . Days of Exercise per Week: 0 days  . Minutes of Exercise per Session: 0 min  Stress: No Stress Concern Present  . Feeling of Stress : Not at all  Social Connections:   . Frequency of Communication with Friends and Family: Not on file  . Frequency of Social Gatherings with Friends and Family: Not on file  . Attends Religious Services: Not on file  . Active Member of Clubs or Organizations: Not on file  . Attends Archivist Meetings: Not on file  . Marital Status: Not on file  Intimate Partner Violence: Not At Risk  . Fear of Current or Ex-Partner: No  . Emotionally Abused: No  . Physically Abused: No  . Sexually Abused: No    FAMILY HISTORY: Family History  Problem Relation Age of Onset  . Osteoporosis Mother   . Stroke Mother   . Coronary artery disease Father   . Stroke Father 68  . Diabetes Other        Aunts and uncles  . Breast cancer Paternal Aunt   . Breast cancer Maternal Aunt   . Arthritis/Rheumatoid Child   . Breast cancer Cousin     ALLERGIES:  is allergic to codeine.  MEDICATIONS:  Current Outpatient Medications  Medication Sig Dispense Refill  . acetaminophen (TYLENOL) 500 MG tablet Take 500-1,000 mg by mouth every 6 (six) hours as needed for mild pain or headache.    . albuterol (VENTOLIN HFA) 108 (90 Base) MCG/ACT inhaler Inhale 2 puffs into the lungs every 6 (six) hours as needed for wheezing or shortness of breath. 8 g 2  . cetirizine  (ZYRTEC) 10 MG tablet Take 10 mg by mouth daily.    . Darbepoetin Alfa (ARANESP) 500 MCG/ML SOSY injection Inject 500 mcg into the skin See admin instructions. Inject 500 mcg into the skin every other week    . dextromethorphan (DELSYM) 30 MG/5ML liquid Take 15-30 mg by mouth every 12 (twelve) hours as needed for cough.     . furosemide (LASIX) 20 MG tablet Take 1 tablet (20 mg total) by mouth daily as needed for fluid or edema. 15 tablet 0  . ipratropium-albuterol (DUONEB) 0.5-2.5 (3) MG/3ML SOLN Take 3 mLs by nebulization every 6 (six) hours as needed. 360 mL 0  . lenalidomide (REVLIMID) 10 MG capsule Take 1 capsule (10 mg total) by mouth daily. 28 capsule 0  . levothyroxine (SYNTHROID) 25 MCG tablet Take 1 tablet (25 mcg total) by mouth daily before breakfast. 90 tablet 3  . ondansetron (ZOFRAN ODT) 8 MG disintegrating tablet Take 1 tablet (8 mg total) by mouth every 8 (eight) hours as needed for nausea or vomiting. [place]  tablet 0  . potassium chloride (KLOR-CON) 10 MEQ tablet Take 1 tablet (10 mEq total) by mouth daily. 14 tablet 0  . predniSONE (DELTASONE) 10 MG tablet Take 1 tablet (10 mg total) by mouth daily with breakfast. 7 tablet 0  . sodium chloride (OCEAN) 0.65 % SOLN nasal spray Place 1 spray into both nostrils as needed for congestion.    . Sulfacetamide Sodium, Acne, 10 % LOTN Apply 1 application topically See admin instructions. Apply to face once a day as directed    . aspirin EC 81 MG tablet Take 81 mg by mouth at bedtime. Swallow whole.  (Patient not taking: Reported on 05/26/2020)    . BIOTIN PO Take 1 tablet by mouth 3 (three) times a week.  (Patient not taking: Reported on 05/20/2020)    . Calcium Carb-Cholecalciferol (CALCIUM 600+D3 PO) Take 1 tablet by mouth 3 (three) times a week.  (Patient not taking: Reported on 05/20/2020)    . chlorpheniramine-HYDROcodone (TUSSIONEX) 10-8 MG/5ML SUER Take 5 mLs by mouth every 12 (twelve) hours as needed for cough (prn cough). (Patient not  taking: Reported on 05/20/2020) 140 mL 0  . loratadine (CLARITIN) 10 MG tablet Take 10 mg by mouth daily. (Patient not taking: Reported on 06/09/2020)    . montelukast (SINGULAIR) 10 MG tablet Take 1 tablet (10 mg total) by mouth at bedtime. For allergies. 30 tablet 1   No current facility-administered medications for this visit.      PHYSICAL EXAMINATION:   Vitals:   06/10/20 1449  BP: (!) 118/57  Pulse: 81  Resp: 16  Temp: 99.2 F (37.3 C)  SpO2: 99%   Filed Weights   06/10/20 1449  Weight: 149 lb (67.6 kg)    Physical Exam Constitutional:      Comments: Appears pale. Walk independently.  Alone.  HENT:     Head: Normocephalic and atraumatic.     Mouth/Throat:     Pharynx: No oropharyngeal exudate.  Eyes:     Pupils: Pupils are equal, round, and reactive to light.  Cardiovascular:     Rate and Rhythm: Normal rate and regular rhythm.     Pulses: Normal pulses.     Heart sounds: Normal heart sounds.  Pulmonary:     Effort: Pulmonary effort is normal. No respiratory distress.     Breath sounds: Normal breath sounds. No wheezing.  Abdominal:     General: Bowel sounds are normal. There is no distension.     Palpations: Abdomen is soft. There is no mass.     Tenderness: There is no abdominal tenderness. There is no guarding or rebound.  Musculoskeletal:        General: No tenderness. Normal range of motion.     Cervical back: Normal range of motion and neck supple.  Skin:    General: Skin is warm.     Coloration: Skin is pale.  Neurological:     Mental Status: She is alert and oriented to person, place, and time.  Psychiatric:        Mood and Affect: Affect normal.     LABORATORY DATA:  I have reviewed the data as listed Lab Results  Component Value Date   WBC 5.7 06/10/2020   HGB 9.8 (L) 06/10/2020   HCT 30.4 (L) 06/10/2020   MCV 93.8 06/10/2020   PLT 157 06/10/2020   Recent Labs    04/29/20 0928 05/06/20 1303 05/20/20 0839 05/22/20 0829  06/03/20 1351  NA 138   < > 127*  135 137  K 3.9   < > 3.8 3.3* 4.3  CL 104   < > 96* 101 104  CO2 27   < > 21* 23 26  GLUCOSE 132*   < > 114* 120* 113*  BUN 19   < > 29* 40* 15  CREATININE 0.76   < > 1.23* 1.16* 1.10*  CALCIUM 9.1   < > 8.2* 8.8* 8.2*  GFRNONAA >60   < > 45* 48* 51*  GFRAA >60   < > 52* 56* 59*  PROT 7.9  --  7.6  --  7.3  ALBUMIN 4.3  --  3.3*  --  3.5  AST 20  --  86*  --  19  ALT 14  --  57*  --  20  ALKPHOS 43  --  32*  --  43  BILITOT 1.3*  --  1.7*  --  1.6*  BILIDIR  --   --  0.5*  --   --   IBILI  --   --  1.2*  --   --    < > = values in this interval not displayed.     DG Chest 2 View  Result Date: 05/17/2020 CLINICAL DATA:  69 year old female with history of chest pain and shortness of breath. Dry cough for a few days. EXAM: CHEST - 2 VIEW COMPARISON:  No priors. FINDINGS: Areas of interstitial prominence and some ill-defined opacities are noted in the lower lungs bilaterally, concerning for bronchitis and possible developing multilobar bronchopneumonia. Small bilateral pleural effusions. No evidence of pulmonary edema. No pneumothorax. Heart size is normal. Upper mediastinal contours are within normal limits. IMPRESSION: 1. The appearance the chest suggests bronchitis with developing multilobar bronchopneumonia. 2. Small bilateral pleural effusions. Electronically Signed   By: Vinnie Langton M.D.   On: 05/17/2020 11:21   CT Angio Chest PE W and/or Wo Contrast  Result Date: 05/17/2020 CLINICAL DATA:  Shortness of breath and cough with chest pain. EXAM: CT ANGIOGRAPHY CHEST WITH CONTRAST TECHNIQUE: Multidetector CT imaging of the chest was performed using the standard protocol during bolus administration of intravenous contrast. Multiplanar CT image reconstructions and MIPs were obtained to evaluate the vascular anatomy. CONTRAST:  71m OMNIPAQUE IOHEXOL 350 MG/ML SOLN COMPARISON:  None. FINDINGS: Cardiovascular: Satisfactory opacification of the pulmonary  arteries to the segmental level. No evidence of pulmonary embolism. Normal heart size. No pericardial effusion. Mediastinum/Nodes: There is mild right hilar lymphadenopathy. Thyroid gland, trachea, and esophagus demonstrate no significant findings. Lungs/Pleura: Mild linear scarring and/or atelectasis is seen within the inferior aspect of the left upper lobe and posterior aspect of the bilateral lower lobes. A trace amount of pleural fluid is seen, bilaterally. There is no evidence of pneumothorax. Upper Abdomen: No acute abnormality. Musculoskeletal: No chest wall abnormality. No acute or significant osseous findings. Review of the MIP images confirms the above findings. IMPRESSION: 1. No evidence of pulmonary embolism. 2. Trace amount of pleural fluid, bilaterally. 3. Mild linear scarring and/or atelectasis within the inferior aspect of the left upper lobe and posterior aspect of the bilateral lower lobes. Electronically Signed   By: TVirgina NorfolkM.D.   On: 05/17/2020 16:41   ECHOCARDIOGRAM COMPLETE  Result Date: 06/06/2020    ECHOCARDIOGRAM REPORT   Patient Name:   Carla [name] Date of Exam: 06/06/2020 Medical Rec #:  [id number]  Height:       64.0 in Accession #:    [id number] Weight:  160.0 lb Date of Birth:  1951/02/04   BSA:          1.779 m Patient Age:    57 years    BP:           106/66 mmHg Patient Gender: F           HR:           88 bpm. Exam Location:  [facility] Procedure: 2D Echo, Cardiac Doppler and Color Doppler Indications:     Cardiomyopathy-Unspecified 425.9  History:         Patient has no prior history of Echocardiogram examinations.                  History of syncope.  Sonographer:     Sherrie Sport RDCS (AE) Referring Phys:  [id number] Cammie Sickle Diagnosing Phys: Ida Rogue MD IMPRESSIONS  1. Left ventricular ejection fraction, by estimation, is 60 to 65%. The left ventricle has normal function. The left ventricle has no regional wall motion abnormalities. Left ventricular  diastolic parameters are consistent with Grade I diastolic dysfunction (impaired relaxation).  2. Right ventricular systolic function is normal. The right ventricular size is normal. There is normal pulmonary artery systolic pressure. The estimated right ventricular systolic pressure is 48.3 mmHg. FINDINGS  Left Ventricle: Left ventricular ejection fraction, by estimation, is 60 to 65%. The left ventricle has normal function. The left ventricle has no regional wall motion abnormalities. The left ventricular internal cavity size was normal in size. There is  no left ventricular hypertrophy. Left ventricular diastolic parameters are consistent with Grade I diastolic dysfunction (impaired relaxation). Right Ventricle: The right ventricular size is normal. No increase in right ventricular wall thickness. Right ventricular systolic function is normal. There is normal pulmonary artery systolic pressure. The tricuspid regurgitant velocity is 2.55 m/s, and  with an assumed right atrial pressure of 5 mmHg, the estimated right ventricular systolic pressure is 50.7 mmHg. Left Atrium: Left atrial size was normal in size. Right Atrium: Right atrial size was normal in size. Pericardium: There is no evidence of pericardial effusion. Mitral Valve: The mitral valve is normal in structure. No evidence of mitral valve regurgitation. No evidence of mitral valve stenosis. Tricuspid Valve: The tricuspid valve is normal in structure. Tricuspid valve regurgitation is mild . No evidence of tricuspid stenosis. Aortic Valve: The aortic valve was not assessed. Aortic valve regurgitation is not visualized. No aortic stenosis is present. Aortic valve mean gradient measures 4.0 mmHg. Aortic valve peak gradient measures 8.8 mmHg. Aortic valve area, by VTI measures 2.19 cm. Pulmonic Valve: The pulmonic valve was not assessed. Pulmonic valve regurgitation is not visualized. No evidence of pulmonic stenosis. Aorta: The aortic root is normal in size  and structure. Venous: The inferior vena cava is normal in size with greater than 50% respiratory variability, suggesting right atrial pressure of 3 mmHg. IAS/Shunts: No atrial level shunt detected by color flow Doppler.  LEFT VENTRICLE PLAX 2D LVIDd:         3.86 cm  Diastology LVIDs:         2.48 cm  LV e' medial:    7.07 cm/s LV PW:         1.07 cm  LV E/e' medial:  12.3 LV IVS:        0.80 cm  LV e' lateral:   9.25 cm/s LVOT diam:     2.00 cm  LV E/e' lateral: 9.4 LV SV:  58 LV SV Index:   32 LVOT Area:     3.14 cm  RIGHT VENTRICLE RV Basal diam:  2.87 cm RV S prime:     12.50 cm/s TAPSE (M-mode): 3.5 cm LEFT ATRIUM             Index       RIGHT ATRIUM           Index LA diam:        2.70 cm 1.52 cm/m  RA Area:     16.00 cm LA Vol (A2C):   46.0 ml 25.85 ml/m RA Volume:   41.00 ml  23.04 ml/m LA Vol (A4C):   22.4 ml 12.59 ml/m LA Biplane Vol: 33.1 ml 18.60 ml/m  AORTIC VALVE                   PULMONIC VALVE AV Area (Vmax):    2.08 cm    PV Vmax:        0.83 m/s AV Area (Vmean):   2.08 cm    PV Peak grad:   2.8 mmHg AV Area (VTI):     2.19 cm    RVOT Peak grad: 4 mmHg AV Vmax:           148.00 cm/s AV Vmean:          95.400 cm/s AV VTI:            0.264 m AV Peak Grad:      8.8 mmHg AV Mean Grad:      4.0 mmHg LVOT Vmax:         97.90 cm/s LVOT Vmean:        63.200 cm/s LVOT VTI:          0.184 m LVOT/AV VTI ratio: 0.70  AORTA Ao Root diam: 2.40 cm MITRAL VALVE               TRICUSPID VALVE MV Area (PHT): 4.21 cm    TR Peak grad:   26.0 mmHg MV Decel Time: 180 msec    TR Vmax:        255.00 cm/s MV E velocity: 87.00 cm/s MV A velocity: 87.00 cm/s  SHUNTS MV E/A ratio:  1.00        Systemic VTI:  0.18 m                            Systemic Diam: 2.00 cm Ida Rogue MD Electronically signed by Ida Rogue MD Signature Date/Time: 06/06/2020/5:18:43 PM    Final     MDS (myelodysplastic syndrome), low grade ([place]) # Low grade MDS- with ringed sideroblasts; no blasts. POSITIVE  For SF3B1;  R-IPSS-low risk.  Poor response to Aranesp; Continue Aranesp 500 mcg weekly subcu injections; also currently on lenalidomide 10 mg a day-tolerating fairly well.  # Hemoglobin is 9.8; continue Aranesp/Revlimid.  White count and platelets are normal..  Monitor closely.  Continue Revlimid.  # Sinus drainage-currently on Claritin.  Recommend adding singulair.  New prescription sent.  # LE swelling-likely secondary to third spacing.  Currently improved.  2D echo no evidence of any concerns for congestive heart failure.  Reviewed the results.  # DISPOSITION:  # proceed with  Aranesp today # in 1 week- H&H-hold tube; possible aranesp # follow up in 2 weeks- MD; labs- cbc/Cmp;LDH;HOLD tube;possible; Aranesp SQ- Dr.B  All questions were answered. The patient knows to call the clinic with any problems, questions  or concerns.   Cammie Sickle, MD 06/12/2020 10:18 AM

## 2020-06-17 ENCOUNTER — Other Ambulatory Visit: Payer: Self-pay

## 2020-06-17 ENCOUNTER — Inpatient Hospital Stay: Payer: PPO

## 2020-06-17 VITALS — BP 122/72 | HR 85

## 2020-06-17 DIAGNOSIS — I50811 Acute right heart failure: Secondary | ICD-10-CM

## 2020-06-17 DIAGNOSIS — D4620 Refractory anemia with excess of blasts, unspecified: Secondary | ICD-10-CM | POA: Diagnosis not present

## 2020-06-17 DIAGNOSIS — D649 Anemia, unspecified: Secondary | ICD-10-CM

## 2020-06-17 LAB — COMPREHENSIVE METABOLIC PANEL
ALT: 15 U/L (ref 0–44)
AST: 17 U/L (ref 15–41)
Albumin: 3.9 g/dL (ref 3.5–5.0)
Alkaline Phosphatase: 51 U/L (ref 38–126)
Anion gap: 7 (ref 5–15)
BUN: 13 mg/dL (ref 8–23)
CO2: 27 mmol/L (ref 22–32)
Calcium: 8.5 mg/dL — ABNORMAL LOW (ref 8.9–10.3)
Chloride: 104 mmol/L (ref 98–111)
Creatinine, Ser: 0.95 mg/dL (ref 0.44–1.00)
GFR calc Af Amer: >60 mL/min (ref >60)
GFR calc non Af Amer: >60 mL/min (ref >60)
Glucose, Bld: 134 mg/dL — ABNORMAL HIGH (ref 70–99)
Potassium: 3.6 mmol/L (ref 3.5–5.1)
Sodium: 138 mmol/L (ref 135–145)
Total Bilirubin: 1 mg/dL (ref 0.3–1.2)
Total Protein: 7.7 g/dL (ref 6.5–8.1)

## 2020-06-17 LAB — CBC WITH DIFFERENTIAL/PLATELET
Abs Immature Granulocytes: 0.03 10*3/uL (ref 0.00–0.07)
Basophils Absolute: 0.1 10*3/uL (ref 0.0–0.1)
Basophils Relative: 1 %
Eosinophils Absolute: 0.4 10*3/uL (ref 0.0–0.5)
Eosinophils Relative: 10 %
HCT: 28.3 % — ABNORMAL LOW (ref 36.0–46.0)
Hemoglobin: 9.3 g/dL — ABNORMAL LOW (ref 12.0–15.0)
Immature Granulocytes: 1 %
Lymphocytes Relative: 35 %
Lymphs Abs: 1.5 10*3/uL (ref 0.7–4.0)
MCH: 30.9 pg (ref 26.0–34.0)
MCHC: 32.9 g/dL (ref 30.0–36.0)
MCV: 94 fL (ref 80.0–100.0)
Monocytes Absolute: 1.1 10*3/uL — ABNORMAL HIGH (ref 0.1–1.0)
Monocytes Relative: 27 %
Neutro Abs: 1 10*3/uL — ABNORMAL LOW (ref 1.7–7.7)
Neutrophils Relative %: 26 %
Platelets: 169 10*3/uL (ref 150–400)
RBC: 3.01 MIL/uL — ABNORMAL LOW (ref 3.87–5.11)
RDW: 23.2 % — ABNORMAL HIGH (ref 11.5–15.5)
WBC: 4 10*3/uL (ref 4.0–10.5)
nRBC: 0 % (ref 0.0–0.2)

## 2020-06-17 MED ORDER — DARBEPOETIN ALFA 500 MCG/ML IJ SOSY
500.0000 ug | PREFILLED_SYRINGE | Freq: Once | INTRAMUSCULAR | Status: AC
  Administered 2020-06-17: 500 ug via SUBCUTANEOUS
  Filled 2020-06-17: qty 1

## 2020-06-18 LAB — SAMPLE TO BLOOD BANK

## 2020-06-23 DIAGNOSIS — J180 Bronchopneumonia, unspecified organism: Secondary | ICD-10-CM | POA: Diagnosis not present

## 2020-06-24 ENCOUNTER — Telehealth: Payer: Self-pay | Admitting: *Deleted

## 2020-06-24 ENCOUNTER — Other Ambulatory Visit: Payer: Self-pay

## 2020-06-24 ENCOUNTER — Encounter: Payer: Self-pay | Admitting: Internal Medicine

## 2020-06-24 ENCOUNTER — Inpatient Hospital Stay (HOSPITAL_BASED_OUTPATIENT_CLINIC_OR_DEPARTMENT_OTHER): Payer: PPO | Admitting: Internal Medicine

## 2020-06-24 ENCOUNTER — Inpatient Hospital Stay: Payer: PPO

## 2020-06-24 ENCOUNTER — Other Ambulatory Visit: Payer: Self-pay | Admitting: *Deleted

## 2020-06-24 DIAGNOSIS — D4620 Refractory anemia with excess of blasts, unspecified: Secondary | ICD-10-CM | POA: Diagnosis not present

## 2020-06-24 DIAGNOSIS — D649 Anemia, unspecified: Secondary | ICD-10-CM

## 2020-06-24 DIAGNOSIS — Z23 Encounter for immunization: Secondary | ICD-10-CM

## 2020-06-24 DIAGNOSIS — I50811 Acute right heart failure: Secondary | ICD-10-CM

## 2020-06-24 LAB — CBC WITH DIFFERENTIAL/PLATELET
Abs Immature Granulocytes: 0.09 10*3/uL — ABNORMAL HIGH (ref 0.00–0.07)
Basophils Absolute: 0.1 10*3/uL (ref 0.0–0.1)
Basophils Relative: 1 %
Eosinophils Absolute: 0.3 10*3/uL (ref 0.0–0.5)
Eosinophils Relative: 7 %
HCT: 31.8 % — ABNORMAL LOW (ref 36.0–46.0)
Hemoglobin: 10.5 g/dL — ABNORMAL LOW (ref 12.0–15.0)
Immature Granulocytes: 2 %
Lymphocytes Relative: 31 %
Lymphs Abs: 1.4 10*3/uL (ref 0.7–4.0)
MCH: 32.4 pg (ref 26.0–34.0)
MCHC: 33 g/dL (ref 30.0–36.0)
MCV: 98.1 fL (ref 80.0–100.0)
Monocytes Absolute: 1.4 10*3/uL — ABNORMAL HIGH (ref 0.1–1.0)
Monocytes Relative: 32 %
Neutro Abs: 1.2 10*3/uL — ABNORMAL LOW (ref 1.7–7.7)
Neutrophils Relative %: 27 %
Platelets: 338 10*3/uL (ref 150–400)
RBC: 3.24 MIL/uL — ABNORMAL LOW (ref 3.87–5.11)
RDW: 27.2 % — ABNORMAL HIGH (ref 11.5–15.5)
WBC: 4.5 10*3/uL (ref 4.0–10.5)
nRBC: 1.1 % — ABNORMAL HIGH (ref 0.0–0.2)

## 2020-06-24 LAB — COMPREHENSIVE METABOLIC PANEL
ALT: 15 U/L (ref 0–44)
AST: 18 U/L (ref 15–41)
Albumin: 4.1 g/dL (ref 3.5–5.0)
Alkaline Phosphatase: 50 U/L (ref 38–126)
Anion gap: 8 (ref 5–15)
BUN: 8 mg/dL (ref 8–23)
CO2: 27 mmol/L (ref 22–32)
Calcium: 8.6 mg/dL — ABNORMAL LOW (ref 8.9–10.3)
Chloride: 105 mmol/L (ref 98–111)
Creatinine, Ser: 1.08 mg/dL — ABNORMAL HIGH (ref 0.44–1.00)
GFR calc Af Amer: >60 mL/min (ref >60)
GFR calc non Af Amer: 52 mL/min — ABNORMAL LOW (ref >60)
Glucose, Bld: 101 mg/dL — ABNORMAL HIGH (ref 70–99)
Potassium: 3.5 mmol/L (ref 3.5–5.1)
Sodium: 140 mmol/L (ref 135–145)
Total Bilirubin: 1.1 mg/dL (ref 0.3–1.2)
Total Protein: 7.7 g/dL (ref 6.5–8.1)

## 2020-06-24 MED ORDER — INFLUENZA VAC A&B SA ADJ QUAD 0.5 ML IM PRSY
0.5000 mL | PREFILLED_SYRINGE | Freq: Once | INTRAMUSCULAR | Status: AC
  Administered 2020-06-24: 0.5 mL via INTRAMUSCULAR
  Filled 2020-06-24: qty 0.5

## 2020-06-24 MED ORDER — LENALIDOMIDE 10 MG PO CAPS: 10.0000 mg | ORAL_CAPSULE | Freq: Every day | ORAL | 0 refills | Status: DC

## 2020-06-24 NOTE — Telephone Encounter (Signed)
-----   Message from Darl Pikes, [place] sent at 06/24/2020 11:28 AM EDT ----- Regarding: FW: RX REFILL REQUEST BIOLOGICS Fabio Pierce!  This is a Revlimid refill request.  Clearnce Sorrel  ----- Message ----- From: Kelly Splinter Sent: 06/24/2020  10:40 AM EDT To: Darl Pikes, RPH-CPP Subject: RX REFILL REQUEST BIOLOGICS                    Hi Alyson,  9/28 RX REFILL REQUEST BIOLOGICS has been uploaded to the media tab for review.  Thanks, [employer]

## 2020-06-24 NOTE — Progress Notes (Signed)
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 Erythropoietin levels-60. 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  female history of low-grade MDS anemia with ring sideroblasts currently on Aranesp/Revlimid is here for follow-up.  Patient admits to fair energy levels.  No swelling in the legs.  Patient's upper respiratory/allergy is improved on Singulair.  However patient notes to have episodes of nausea with vomiting over the last few days.  She also episodes of diarrhea up to 3 loose stools a day.  She attributes this to Revlimid.  No skin rash.   No new shortness of breath or cough.  Review of Systems  Constitutional: Positive for malaise/fatigue. Negative for chills, diaphoresis and fever.  HENT: Negative for nosebleeds and  sore throat.   Eyes: Negative for double vision.  Respiratory: Negative for cough, hemoptysis, sputum production, shortness of breath and wheezing.   Cardiovascular: Negative for chest pain and orthopnea.  Gastrointestinal: Negative for abdominal pain, blood in stool, constipation, diarrhea, heartburn, melena and vomiting.  Genitourinary: Negative for dysuria, frequency and urgency.  Musculoskeletal: Positive for back pain and joint pain.  Skin: Negative.  Negative for itching and rash.  Neurological: Negative for dizziness, tingling, focal weakness, weakness and headaches.  Endo/Heme/Allergies: Does not bruise/bleed easily.  Psychiatric/Behavioral: Negative for depression. The patient is not nervous/anxious and does not have insomnia.     MEDICAL HISTORY:  Past Medical History:  Diagnosis Date  . Allergic rhinitis, cause unspecified   . Anemia, unspecified   . Carpal tunnel syndrome   . Cystitis, unspecified   . Family history of osteoporosis   . PONV (postoperative nausea and vomiting)   . Postnasal drip   . Syncope and collapse     SURGICAL HISTORY: Past Surgical History:  Procedure Laterality Date  . CARPAL TUNNEL RELEASE    . COLONOSCOPY  2005  . COLONOSCOPY WITH PROPOFOL N/A 07/31/2015   Procedure: COLONOSCOPY WITH PROPOFOL;  Surgeon: RRonald Lobo MD;  Location: [facility];  Service: Endoscopy;  Laterality: N/A;  . DIAGNOSTIC LAPAROSCOPY     dx lack of pregnancy   . EYE SURGERY     lasik, cataract, prk,yag procedure    SOCIAL HISTORY: Social History   Socioeconomic History  . Marital status: Married    Spouse name: Not on file  . Number of children: Not on file  . Years of education: Not on file  . Highest education level: Not on file  Occupational History  . Not on file  Tobacco Use  . Smoking status: Never Smoker  . Smokeless tobacco: Never Used  Vaping Use  . Vaping Use: Never used  Substance and Sexual Activity  . Alcohol use: Yes     Alcohol/week: 0.0 standard drinks    Comment: Rare  . Drug use: No  . Sexual activity: Not Currently  Other Topics Concern  . Not on file  Social History Narrative   No regular exercise      Drinks lots of [employer]         Social Determinants of Health   Financial Resource Strain: Low Risk   . Difficulty of Paying Living Expenses: Not hard at all  Food Insecurity: No Food Insecurity  . Worried About Charity fundraiser in the Last Year: Never true  . Ran Out of Food in the Last Year: Never true  Transportation Needs: No Transportation Needs  . Lack of Transportation (Medical): No  . Lack of Transportation (Non-Medical): No  Physical Activity: Inactive  . Days of Exercise per Week: 0 days  . Minutes of Exercise per Session: 0 min  Stress: No Stress Concern Present  . Feeling of Stress : Not at all  Social Connections:   . Frequency of Communication with Friends and Family: Not on file  . Frequency of Social Gatherings with Friends and Family: Not on file  . Attends Religious Services: Not on file  . Active Member of Clubs or Organizations: Not on file  . Attends Archivist Meetings: Not on file  . Marital Status: Not on file  Intimate Partner Violence: Not At Risk  . Fear of Current or Ex-Partner: No  . Emotionally Abused: No  . Physically Abused: No  . Sexually Abused: No    FAMILY HISTORY: Family History  Problem Relation Age of Onset  . Osteoporosis Mother   . Stroke Mother   . Coronary artery disease Father   . Stroke Father 50  . Diabetes Other        Aunts and uncles  . Breast cancer Paternal Aunt   . Breast cancer Maternal Aunt   . Arthritis/Rheumatoid Child   . Breast cancer Cousin     ALLERGIES:  is allergic to codeine.  MEDICATIONS:  Current Outpatient Medications  Medication Sig Dispense Refill  . acetaminophen (TYLENOL) 500 MG tablet Take 500-1,000 mg by mouth every 6 (six) hours as needed for mild pain or headache.    Marland Kitchen aspirin EC 81  MG tablet Take 81 mg by mouth at bedtime. Swallow whole.     Marland Kitchen BIOTIN PO Take 1 tablet by mouth 3 (three) times a week.     . Calcium Carb-Cholecalciferol (CALCIUM 600+D3 PO) Take 1 tablet by mouth 3 (three) times a week.     . Darbepoetin Alfa (ARANESP) 500 MCG/ML SOSY injection Inject 500 mcg into the skin See admin instructions. Inject 500 mcg into the skin every other week    . levothyroxine (SYNTHROID) 25 MCG tablet Take 1 tablet (25 mcg total) by mouth daily before breakfast. 90 tablet 3  . montelukast (SINGULAIR) 10 MG tablet Take 1 tablet (10 mg total) by mouth at bedtime. For allergies. 30 tablet 1  . ondansetron (ZOFRAN ODT) 8 MG disintegrating tablet Take 1 tablet (8 mg total) by mouth every 8 (eight) hours as needed for nausea or vomiting. 30 tablet 0  . sodium chloride (OCEAN) 0.65 % SOLN nasal spray Place 1 spray into both nostrils as needed for congestion.    Marland Kitchen  Sulfacetamide Sodium, Acne, 10 % LOTN Apply 1 application topically See admin instructions. Apply to face once a day as directed    . albuterol (VENTOLIN HFA) 108 (90 Base) MCG/ACT inhaler Inhale 2 puffs into the lungs every 6 (six) hours as needed for wheezing or shortness of breath. (Patient not taking: Reported on 06/24/2020) 8 g 2  . cetirizine (ZYRTEC) 10 MG tablet Take 10 mg by mouth daily. (Patient not taking: Reported on 06/24/2020)    . dextromethorphan (DELSYM) 30 MG/5ML liquid Take 15-30 mg by mouth every 12 (twelve) hours as needed for cough.     . furosemide (LASIX) 20 MG tablet Take 1 tablet (20 mg total) by mouth daily as needed for fluid or edema. 15 tablet 0  . ipratropium-albuterol (DUONEB) 0.5-2.5 (3) MG/3ML SOLN Take 3 mLs by nebulization every 6 (six) hours as needed. 360 mL 0  . lenalidomide (REVLIMID) 10 MG capsule Take 1 capsule (10 mg total) by mouth daily. 3 weeks-On and 1 week-OFF. 21 capsule 0  . lenalidomide (REVLIMID) 10 MG capsule Take 1 capsule (10 mg total) by mouth daily. 3 weeks-On and 1 week-OFF.  21 capsule 0  . loratadine (CLARITIN) 10 MG tablet Take 10 mg by mouth daily. (Patient not taking: Reported on 06/09/2020)    . potassium chloride (KLOR-CON) 10 MEQ tablet Take 1 tablet (10 mEq total) by mouth daily. (Patient not taking: Reported on 06/24/2020) 14 tablet 0  . predniSONE (DELTASONE) 10 MG tablet Take 1 tablet (10 mg total) by mouth daily with breakfast. 7 tablet 0   No current facility-administered medications for this visit.      PHYSICAL EXAMINATION:   Vitals:   06/24/20 1514  BP: (!) 111/47  Pulse: 70  Resp: 16  Temp: 98.5 F (36.9 C)  SpO2: 98%   Filed Weights   06/24/20 1514  Weight: 148 lb (67.1 kg)    Physical Exam Constitutional:      Comments: Appears pale. Walk independently.  Alone.  HENT:     Head: Normocephalic and atraumatic.     Mouth/Throat:     Pharynx: No oropharyngeal exudate.  Eyes:     Pupils: Pupils are equal, round, and reactive to light.  Cardiovascular:     Rate and Rhythm: Normal rate and regular rhythm.     Pulses: Normal pulses.     Heart sounds: Normal heart sounds.  Pulmonary:     Effort: Pulmonary effort is normal. No respiratory distress.     Breath sounds: Normal breath sounds. No wheezing.  Abdominal:     General: Bowel sounds are normal. There is no distension.     Palpations: Abdomen is soft. There is no mass.     Tenderness: There is no abdominal tenderness. There is no guarding or rebound.  Musculoskeletal:        General: No tenderness. Normal range of motion.     Cervical back: Normal range of motion and neck supple.  Skin:    General: Skin is warm.     Coloration: Skin is pale.  Neurological:     Mental Status: She is alert and oriented to person, place, and time.  Psychiatric:        Mood and Affect: Affect normal.     LABORATORY DATA:  I have reviewed the data as listed Lab Results  Component Value Date   WBC 4.5 06/24/2020   HGB 10.5 (L) 06/24/2020   HCT 31.8 (L) 06/24/2020   MCV 98.1  06/24/2020  PLT 338 06/24/2020   Recent Labs    05/20/20 0839 05/22/20 0829 06/03/20 1351 06/17/20 1516 06/24/20 1504  NA 127*   < > 137 138 140  K 3.8   < > 4.3 3.6 3.5  CL 96*   < > 104 104 105  CO2 21*   < > '26 27 27  ' GLUCOSE 114*   < > 113* 134* 101*  BUN 29*   < > '15 13 8  ' CREATININE 1.23*   < > 1.10* 0.95 1.08*  CALCIUM 8.2*   < > 8.2* 8.5* 8.6*  GFRNONAA 45*   < > 51* >60 52*  GFRAA 52*   < > 59* >60 >60  PROT 7.6  --  7.3 7.7 7.7  ALBUMIN 3.3*  --  3.5 3.9 4.1  AST 86*  --  '19 17 18  ' ALT 57*  --  '20 15 15  ' ALKPHOS 32*  --  43 51 50  BILITOT 1.7*  --  1.6* 1.0 1.1  BILIDIR 0.5*  --   --   --   --   IBILI 1.2*  --   --   --   --    < > = values in this interval not displayed.     ECHOCARDIOGRAM COMPLETE  Result Date: 06/06/2020    ECHOCARDIOGRAM REPORT   Patient Name:   Carla [name] Date of Exam: 06/06/2020 Medical Rec #:  [id number]   Height:       64.0 in Accession #:    [id number]  Weight:       160.0 lb Date of Birth:  Jul 30, 1951   BSA:          1.779 m Patient Age:    26 years    BP:           106/66 mmHg Patient Gender: F           HR:           88 bpm. Exam Location:  [facility] Procedure: 2D Echo, Cardiac Doppler and Color Doppler Indications:     Cardiomyopathy-Unspecified 425.9  History:         Patient has no prior history of Echocardiogram examinations.                  History of syncope.  Sonographer:     Sherrie Sport RDCS (AE) Referring Phys:  [id number] Cammie Sickle Diagnosing Phys: Ida Rogue MD IMPRESSIONS  1. Left ventricular ejection fraction, by estimation, is 60 to 65%. The left ventricle has normal function. The left ventricle has no regional wall motion abnormalities. Left ventricular diastolic parameters are consistent with Grade I diastolic dysfunction (impaired relaxation).  2. Right ventricular systolic function is normal. The right ventricular size is normal. There is normal pulmonary artery systolic pressure. The estimated right ventricular  systolic pressure is 83.4 mmHg. FINDINGS  Left Ventricle: Left ventricular ejection fraction, by estimation, is 60 to 65%. The left ventricle has normal function. The left ventricle has no regional wall motion abnormalities. The left ventricular internal cavity size was normal in size. There is  no left ventricular hypertrophy. Left ventricular diastolic parameters are consistent with Grade I diastolic dysfunction (impaired relaxation). Right Ventricle: The right ventricular size is normal. No increase in right ventricular wall thickness. Right ventricular systolic function is normal. There is normal pulmonary artery systolic pressure. The tricuspid regurgitant velocity is 2.55 m/s, and  with an assumed right atrial pressure of 5 mmHg, the  estimated right ventricular systolic pressure is 02.6 mmHg. Left Atrium: Left atrial size was normal in size. Right Atrium: Right atrial size was normal in size. Pericardium: There is no evidence of pericardial effusion. Mitral Valve: The mitral valve is normal in structure. No evidence of mitral valve regurgitation. No evidence of mitral valve stenosis. Tricuspid Valve: The tricuspid valve is normal in structure. Tricuspid valve regurgitation is mild . No evidence of tricuspid stenosis. Aortic Valve: The aortic valve was not assessed. Aortic valve regurgitation is not visualized. No aortic stenosis is present. Aortic valve mean gradient measures 4.0 mmHg. Aortic valve peak gradient measures 8.8 mmHg. Aortic valve area, by VTI measures 2.19 cm. Pulmonic Valve: The pulmonic valve was not assessed. Pulmonic valve regurgitation is not visualized. No evidence of pulmonic stenosis. Aorta: The aortic root is normal in size and structure. Venous: The inferior vena cava is normal in size with greater than 50% respiratory variability, suggesting right atrial pressure of 3 mmHg. IAS/Shunts: No atrial level shunt detected by color flow Doppler.  LEFT VENTRICLE PLAX 2D LVIDd:         3.86 cm   Diastology LVIDs:         2.48 cm  LV e' medial:    7.07 cm/s LV PW:         1.07 cm  LV E/e' medial:  12.3 LV IVS:        0.80 cm  LV e' lateral:   9.25 cm/s LVOT diam:     2.00 cm  LV E/e' lateral: 9.4 LV SV:         58 LV SV Index:   32 LVOT Area:     3.14 cm  RIGHT VENTRICLE RV Basal diam:  2.87 cm RV S prime:     12.50 cm/s TAPSE (M-mode): 3.5 cm LEFT ATRIUM             Index       RIGHT ATRIUM           Index LA diam:        2.70 cm 1.52 cm/m  RA Area:     16.00 cm LA Vol (A2C):   46.0 ml 25.85 ml/m RA Volume:   41.00 ml  23.04 ml/m LA Vol (A4C):   22.4 ml 12.59 ml/m LA Biplane Vol: 33.1 ml 18.60 ml/m  AORTIC VALVE                   PULMONIC VALVE AV Area (Vmax):    2.08 cm    PV Vmax:        0.83 m/s AV Area (Vmean):   2.08 cm    PV Peak grad:   2.8 mmHg AV Area (VTI):     2.19 cm    RVOT Peak grad: 4 mmHg AV Vmax:           148.00 cm/s AV Vmean:          95.400 cm/s AV VTI:            0.264 m AV Peak Grad:      8.8 mmHg AV Mean Grad:      4.0 mmHg LVOT Vmax:         97.90 cm/s LVOT Vmean:        63.200 cm/s LVOT VTI:          0.184 m LVOT/AV VTI ratio: 0.70  AORTA Ao Root diam: 2.40 cm MITRAL VALVE  TRICUSPID VALVE MV Area (PHT): 4.21 cm    TR Peak grad:   26.0 mmHg MV Decel Time: 180 msec    TR Vmax:        255.00 cm/s MV E velocity: 87.00 cm/s MV A velocity: 87.00 cm/s  SHUNTS MV E/A ratio:  1.00        Systemic VTI:  0.18 m                            Systemic Diam: 2.00 cm Ida Rogue MD Electronically signed by Ida Rogue MD Signature Date/Time: 06/06/2020/5:18:43 PM    Final     MDS (myelodysplastic syndrome), low grade ([place]) # Low grade MDS- with ringed sideroblasts; no blasts. POSITIVE  For SF3B1; R-IPSS-low risk.  Poor response to Aranesp; Continue Aranesp 500 mcg weekly subcu injections; also currently on lenalidomide 10 mg a day-tolerating fairly well.  # Hemoglobin is 10.5; HOLD Aranesp  White count and platelets are normal..  Monitor closely.  Given the  ongoing nausea/vomiting diarrhea [see below] recommend Revlimid days-1-21; 7 days OFF.  Patient will stop Revlimid starting today.  She will restart Revlimid on 10/05-10 mg; 3 weeks on 1 week off.  #Nausea vomiting diarrhea-grade 1-2;  likely secondary to Revlimid.  Recommend supportive care including Imodium; Zofran.  See above dose modification  # Sinus drainage-currently on Claritin/singulair- improved.  # COVID BOOSTER: Discussed given patient's diagnosis and other comorbidities/therapies-patient would be considered immunocompromised.  As per CDC recommendation/FDA approval-I would recommend booster vaccine.  Patient is interested.  Flu shot advised.   # DISPOSITION:  # HOLD Aranesp today. # in 1 week- H&H-hold tube; possible aranesp # follow up in 2 weeks- MD; labs- cbc/Cmp;LDH;HOLD tube;possible; Aranesp SQ- Dr.B  All questions were answered. The patient knows to call the clinic with any problems, questions or concerns.   Cammie Sickle, MD 06/24/2020 5:12 PM

## 2020-06-24 NOTE — Assessment & Plan Note (Addendum)
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  Poor response to Aranesp; Continue Aranesp 500 mcg weekly subcu injections; also currently on lenalidomide 10 mg a day-tolerating fairly well.  # Hemoglobin is 10.5; HOLD Aranesp  White count and platelets are normal..  Monitor closely.  Given the ongoing nausea/vomiting diarrhea [see below] recommend Revlimid days-1-21; 7 days OFF.  Patient will stop Revlimid starting today.  She will restart Revlimid on 10/05-10 mg; 3 weeks on 1 week off.  #Nausea vomiting diarrhea-grade 1-2;  likely secondary to Revlimid.  Recommend supportive care including Imodium; Zofran.  See above dose modification  # Sinus drainage-currently on Claritin/singulair- improved.  # COVID BOOSTER: Discussed given patient's diagnosis and other comorbidities/therapies-patient would be considered immunocompromised.  As per CDC recommendation/FDA approval-I would recommend booster vaccine.  Patient is interested.  Flu shot advised.   # DISPOSITION:  # HOLD Aranesp today. # in 1 week- H&H-hold tube; possible aranesp # follow up in 2 weeks- MD; labs- cbc/Cmp;LDH;HOLD tube;possible; Aranesp SQ- Dr.B

## 2020-06-24 NOTE — Patient Instructions (Signed)
#   STOP revlimid- Starting today- 9/28 x 1 week # START revlimid- on 10/05- take for 3 weeks.

## 2020-06-24 NOTE — Progress Notes (Signed)
Patient states she often feels nauseated. States that she vomits up to 2-3 times a week. Also states she has bouts of diarrhea here and there. States all this has started since she has been on the revlimid.

## 2020-06-25 ENCOUNTER — Other Ambulatory Visit: Payer: Self-pay | Admitting: *Deleted

## 2020-06-25 DIAGNOSIS — D4620 Refractory anemia with excess of blasts, unspecified: Secondary | ICD-10-CM

## 2020-06-25 LAB — SAMPLE TO BLOOD BANK

## 2020-06-25 MED ORDER — LENALIDOMIDE 10 MG PO CAPS: 10.0000 mg | ORAL_CAPSULE | Freq: Every day | ORAL | 0 refills | Status: DC

## 2020-06-25 NOTE — Telephone Encounter (Signed)
Heather sent the rev script yesterday; please re-fax  GB

## 2020-06-25 NOTE — Telephone Encounter (Signed)
Rx resent to pharmacy

## 2020-06-25 NOTE — Telephone Encounter (Signed)
Pharmacy needs prescription for Revlimid called or faxed.  Phone [phone] Fax [phone]

## 2020-07-01 ENCOUNTER — Inpatient Hospital Stay: Payer: PPO | Attending: Internal Medicine

## 2020-07-01 ENCOUNTER — Other Ambulatory Visit: Payer: Self-pay

## 2020-07-01 ENCOUNTER — Inpatient Hospital Stay: Payer: PPO

## 2020-07-01 DIAGNOSIS — R5381 Other malaise: Secondary | ICD-10-CM | POA: Insufficient documentation

## 2020-07-01 DIAGNOSIS — T451X5A Adverse effect of antineoplastic and immunosuppressive drugs, initial encounter: Secondary | ICD-10-CM | POA: Diagnosis not present

## 2020-07-01 DIAGNOSIS — K521 Toxic gastroenteritis and colitis: Secondary | ICD-10-CM | POA: Diagnosis not present

## 2020-07-01 DIAGNOSIS — Z79899 Other long term (current) drug therapy: Secondary | ICD-10-CM | POA: Insufficient documentation

## 2020-07-01 DIAGNOSIS — R5383 Other fatigue: Secondary | ICD-10-CM | POA: Insufficient documentation

## 2020-07-01 DIAGNOSIS — D649 Anemia, unspecified: Secondary | ICD-10-CM

## 2020-07-01 DIAGNOSIS — D4620 Refractory anemia with excess of blasts, unspecified: Secondary | ICD-10-CM | POA: Diagnosis not present

## 2020-07-01 DIAGNOSIS — Z7982 Long term (current) use of aspirin: Secondary | ICD-10-CM | POA: Diagnosis not present

## 2020-07-01 DIAGNOSIS — D701 Agranulocytosis secondary to cancer chemotherapy: Secondary | ICD-10-CM | POA: Diagnosis not present

## 2020-07-01 DIAGNOSIS — D46Z Other myelodysplastic syndromes: Secondary | ICD-10-CM

## 2020-07-01 DIAGNOSIS — I50811 Acute right heart failure: Secondary | ICD-10-CM

## 2020-07-01 LAB — CBC WITH DIFFERENTIAL/PLATELET
Abs Immature Granulocytes: 0.01 10*3/uL (ref 0.00–0.07)
Basophils Absolute: 0.2 10*3/uL — ABNORMAL HIGH (ref 0.0–0.1)
Basophils Relative: 4 %
Eosinophils Absolute: 0.2 10*3/uL (ref 0.0–0.5)
Eosinophils Relative: 4 %
HCT: 33.8 % — ABNORMAL LOW (ref 36.0–46.0)
Hemoglobin: 11.1 g/dL — ABNORMAL LOW (ref 12.0–15.0)
Immature Granulocytes: 0 %
Lymphocytes Relative: 45 %
Lymphs Abs: 2.2 10*3/uL (ref 0.7–4.0)
MCH: 32.9 pg (ref 26.0–34.0)
MCHC: 32.8 g/dL (ref 30.0–36.0)
MCV: 100.3 fL — ABNORMAL HIGH (ref 80.0–100.0)
Monocytes Absolute: 0.9 10*3/uL (ref 0.1–1.0)
Monocytes Relative: 17 %
Neutro Abs: 1.5 10*3/uL — ABNORMAL LOW (ref 1.7–7.7)
Neutrophils Relative %: 30 %
Platelets: 339 10*3/uL (ref 150–400)
RBC: 3.37 MIL/uL — ABNORMAL LOW (ref 3.87–5.11)
RDW: 26.1 % — ABNORMAL HIGH (ref 11.5–15.5)
WBC: 4.9 10*3/uL (ref 4.0–10.5)
nRBC: 0 % (ref 0.0–0.2)

## 2020-07-01 LAB — COMPREHENSIVE METABOLIC PANEL
ALT: 14 U/L (ref 0–44)
AST: 19 U/L (ref 15–41)
Albumin: 3.9 g/dL (ref 3.5–5.0)
Alkaline Phosphatase: 47 U/L (ref 38–126)
Anion gap: 8 (ref 5–15)
BUN: 14 mg/dL (ref 8–23)
CO2: 26 mmol/L (ref 22–32)
Calcium: 8.5 mg/dL — ABNORMAL LOW (ref 8.9–10.3)
Chloride: 105 mmol/L (ref 98–111)
Creatinine, Ser: 0.97 mg/dL (ref 0.44–1.00)
GFR calc non Af Amer: 59 mL/min — ABNORMAL LOW (ref >60)
Glucose, Bld: 115 mg/dL — ABNORMAL HIGH (ref 70–99)
Potassium: 3.9 mmol/L (ref 3.5–5.1)
Sodium: 139 mmol/L (ref 135–145)
Total Bilirubin: 1.4 mg/dL — ABNORMAL HIGH (ref 0.3–1.2)
Total Protein: 7.9 g/dL (ref 6.5–8.1)

## 2020-07-04 ENCOUNTER — Telehealth: Payer: Self-pay | Admitting: Internal Medicine

## 2020-07-04 NOTE — Telephone Encounter (Signed)
On 10/05-spoke to patient regarding results of her blood work hemoglobin 11.1.;  Okay to proceed with Revlimid should not have any concerns.  We will plan to hold Retacrit if hemoglobin above 10.

## 2020-07-08 ENCOUNTER — Encounter: Payer: Self-pay | Admitting: Internal Medicine

## 2020-07-08 ENCOUNTER — Other Ambulatory Visit: Payer: Self-pay

## 2020-07-08 ENCOUNTER — Inpatient Hospital Stay: Payer: PPO

## 2020-07-08 ENCOUNTER — Inpatient Hospital Stay (HOSPITAL_BASED_OUTPATIENT_CLINIC_OR_DEPARTMENT_OTHER): Payer: PPO | Admitting: Internal Medicine

## 2020-07-08 DIAGNOSIS — D4620 Refractory anemia with excess of blasts, unspecified: Secondary | ICD-10-CM | POA: Diagnosis not present

## 2020-07-08 DIAGNOSIS — I50811 Acute right heart failure: Secondary | ICD-10-CM

## 2020-07-08 LAB — CBC WITH DIFFERENTIAL/PLATELET
Abs Immature Granulocytes: 0.01 10*3/uL (ref 0.00–0.07)
Basophils Absolute: 0.2 10*3/uL — ABNORMAL HIGH (ref 0.0–0.1)
Basophils Relative: 4 %
Eosinophils Absolute: 0.5 10*3/uL (ref 0.0–0.5)
Eosinophils Relative: 10 %
HCT: 32.4 % — ABNORMAL LOW (ref 36.0–46.0)
Hemoglobin: 10.4 g/dL — ABNORMAL LOW (ref 12.0–15.0)
Immature Granulocytes: 0 %
Lymphocytes Relative: 39 %
Lymphs Abs: 1.7 10*3/uL (ref 0.7–4.0)
MCH: 32.8 pg (ref 26.0–34.0)
MCHC: 32.1 g/dL (ref 30.0–36.0)
MCV: 102.2 fL — ABNORMAL HIGH (ref 80.0–100.0)
Monocytes Absolute: 0.9 10*3/uL (ref 0.1–1.0)
Monocytes Relative: 21 %
Neutro Abs: 1.1 10*3/uL — ABNORMAL LOW (ref 1.7–7.7)
Neutrophils Relative %: 26 %
Platelets: 283 10*3/uL (ref 150–400)
RBC: 3.17 MIL/uL — ABNORMAL LOW (ref 3.87–5.11)
RDW: 24.4 % — ABNORMAL HIGH (ref 11.5–15.5)
WBC: 4.4 10*3/uL (ref 4.0–10.5)
nRBC: 0 % (ref 0.0–0.2)

## 2020-07-08 LAB — COMPREHENSIVE METABOLIC PANEL
ALT: 17 U/L (ref 0–44)
AST: 22 U/L (ref 15–41)
Albumin: 4.3 g/dL (ref 3.5–5.0)
Alkaline Phosphatase: 49 U/L (ref 38–126)
Anion gap: 7 (ref 5–15)
BUN: 17 mg/dL (ref 8–23)
CO2: 27 mmol/L (ref 22–32)
Calcium: 8.8 mg/dL — ABNORMAL LOW (ref 8.9–10.3)
Chloride: 106 mmol/L (ref 98–111)
Creatinine, Ser: 1.1 mg/dL — ABNORMAL HIGH (ref 0.44–1.00)
GFR, Estimated: 51 mL/min — ABNORMAL LOW (ref >60)
Glucose, Bld: 127 mg/dL — ABNORMAL HIGH (ref 70–99)
Potassium: 3.8 mmol/L (ref 3.5–5.1)
Sodium: 140 mmol/L (ref 135–145)
Total Bilirubin: 1.7 mg/dL — ABNORMAL HIGH (ref 0.3–1.2)
Total Protein: 8.2 g/dL — ABNORMAL HIGH (ref 6.5–8.1)

## 2020-07-08 NOTE — Progress Notes (Signed)
[facility] CONSULT NOTE  Patient Care Team: [name], [name], MD as PCP - General Rogue Bussing, [name], MD as Consulting Physician (Hematology and Oncology)  CHIEF COMPLAINTS/PURPOSE OF CONSULTATION: Anemia   Oncology History Overview Note   # July 2020-myelodysplastic syndrome with ringed sideroblasts-Normal karyotype; no blasts [IPSS R-Very low risk ~median survival 8.3 years]; iron studies X54 folic acid myeloma panel normal; pyridoxine levels/copper/zinc-WNL. Erythropoietin levels-60. 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  female history of low-grade MDS anemia with ring sideroblasts currently on Aranesp/Revlimid is here for follow-up.  Patient has not needed Aranesp for the last 3 weeks.  No swelling in the legs.  Appetite is good.  No weight loss.  No fevers or chills.  Diarrhea 1-2 loose stools every other day.  No skin rash.   Review of Systems  Constitutional: Positive for malaise/fatigue. Negative for chills, diaphoresis and fever.  HENT: Negative for nosebleeds and sore throat.   Eyes: Negative for double vision.  Respiratory: Negative for cough, hemoptysis, sputum production, shortness of breath and wheezing.   Cardiovascular:  Negative for chest pain and orthopnea.  Gastrointestinal: Negative for abdominal pain, blood in stool, constipation, diarrhea, heartburn, melena and vomiting.  Genitourinary: Negative for dysuria, frequency and urgency.  Musculoskeletal: Positive for back pain and joint pain.  Skin: Negative.  Negative for itching and rash.  Neurological: Negative for dizziness, tingling, focal weakness, weakness and headaches.  Endo/Heme/Allergies: Does not bruise/bleed easily.  Psychiatric/Behavioral: Negative for depression. The patient is not nervous/anxious and does not have insomnia.     MEDICAL HISTORY:  Past Medical History:  Diagnosis Date  . Allergic rhinitis, cause unspecified   . Anemia, unspecified   . Carpal tunnel syndrome   . Cystitis, unspecified   . Family history of osteoporosis   . PONV (postoperative nausea and vomiting)   . Postnasal drip   . Syncope and collapse     SURGICAL HISTORY: Past Surgical History:  Procedure Laterality Date  . CARPAL TUNNEL RELEASE    . COLONOSCOPY  2005  . COLONOSCOPY WITH PROPOFOL N/A 07/31/2015   Procedure: COLONOSCOPY WITH PROPOFOL;  Surgeon: RRonald Lobo MD;  Location: [facility];  Service: Endoscopy;  Laterality: N/A;  . DIAGNOSTIC LAPAROSCOPY     dx lack of pregnancy   . EYE SURGERY     lasik, cataract, prk,yag procedure    SOCIAL HISTORY: Social History   Socioeconomic History  . Marital status: Married    Spouse name: Not on file  . Number of children: Not on file  . Years of education: Not on file  . Highest education level: Not on file  Occupational History  . Not on file  Tobacco Use  . Smoking status: Never Smoker  . Smokeless tobacco: Never Used  Vaping Use  . Vaping Use: Never used  Substance and  Sexual Activity  . Alcohol use: Yes    Alcohol/week: 0.0 standard drinks    Comment: Rare  . Drug use: No  . Sexual activity: Not Currently  Other Topics Concern  . Not on file  Social History Narrative   No  regular exercise      Drinks lots of [employer]         Social Determinants of Health   Financial Resource Strain: Low Risk   . Difficulty of Paying Living Expenses: Not hard at all  Food Insecurity: No Food Insecurity  . Worried About Charity fundraiser in the Last Year: Never true  . Ran Out of Food in the Last Year: Never true  Transportation Needs: No Transportation Needs  . Lack of Transportation (Medical): No  . Lack of Transportation (Non-Medical): No  Physical Activity: Inactive  . Days of Exercise per Week: 0 days  . Minutes of Exercise per Session: 0 min  Stress: No Stress Concern Present  . Feeling of Stress : Not at all  Social Connections:   . Frequency of Communication with Friends and Family: Not on file  . Frequency of Social Gatherings with Friends and Family: Not on file  . Attends Religious Services: Not on file  . Active Member of Clubs or Organizations: Not on file  . Attends Archivist Meetings: Not on file  . Marital Status: Not on file  Intimate Partner Violence: Not At Risk  . Fear of Current or Ex-Partner: No  . Emotionally Abused: No  . Physically Abused: No  . Sexually Abused: No    FAMILY HISTORY: Family History  Problem Relation Age of Onset  . Osteoporosis Mother   . Stroke Mother   . Coronary artery disease Father   . Stroke Father 15  . Diabetes Other        Aunts and uncles  . Breast cancer Paternal Aunt   . Breast cancer Maternal Aunt   . Arthritis/Rheumatoid Child   . Breast cancer Cousin     ALLERGIES:  is allergic to codeine.  MEDICATIONS:  Current Outpatient Medications  Medication Sig Dispense Refill  . acetaminophen (TYLENOL) 500 MG tablet Take 500-1,000 mg by mouth every 6 (six) hours as needed for mild pain or headache.    Marland Kitchen aspirin EC 81 MG tablet Take 81 mg by mouth at bedtime. Swallow whole.     Marland Kitchen BIOTIN PO Take 1 tablet by mouth 3 (three) times a week.     . Calcium Carb-Cholecalciferol (CALCIUM 600+D3 PO)  Take 1 tablet by mouth 3 (three) times a week.     . Darbepoetin Alfa (ARANESP) 500 MCG/ML SOSY injection Inject 500 mcg into the skin See admin instructions. Inject 500 mcg into the skin every other week    . lenalidomide (REVLIMID) 10 MG capsule Take 1 capsule (10 mg total) by mouth daily. 3 weeks-On and 1 week-OFF. 21 capsule 0  . levothyroxine (SYNTHROID) 25 MCG tablet Take 1 tablet (25 mcg total) by mouth daily before breakfast. 90 tablet 3  . montelukast (SINGULAIR) 10 MG tablet Take 1 tablet (10 mg total) by mouth at bedtime. For allergies. 30 tablet 1  . ondansetron (ZOFRAN ODT) 8 MG disintegrating tablet Take 1 tablet (8 mg total) by mouth every 8 (eight) hours as needed for nausea or vomiting. 30 tablet 0  . sodium chloride (OCEAN) 0.65 % SOLN nasal spray Place 1 spray into both nostrils as needed for congestion.    . Sulfacetamide  Sodium, Acne, 10 % LOTN Apply 1 application topically See admin instructions. Apply to face once a day as directed    . cetirizine (ZYRTEC) 10 MG tablet Take 10 mg by mouth daily. (Patient not taking: Reported on 06/24/2020)    . loratadine (CLARITIN) 10 MG tablet Take 10 mg by mouth daily.  (Patient not taking: Reported on 07/08/2020)     No current facility-administered medications for this visit.      PHYSICAL EXAMINATION:   Vitals:   07/08/20 1446  BP: (!) 132/48  Pulse: 72  Resp: 16  Temp: 97.9 F (36.6 C)  SpO2: 99%   Filed Weights   07/08/20 1446  Weight: 149 lb (67.6 kg)    Physical Exam Constitutional:      Comments: Appears pale. Walk independently.  Alone.  HENT:     Head: Normocephalic and atraumatic.     Mouth/Throat:     Pharynx: No oropharyngeal exudate.  Eyes:     Pupils: Pupils are equal, round, and reactive to light.  Cardiovascular:     Rate and Rhythm: Normal rate and regular rhythm.     Pulses: Normal pulses.     Heart sounds: Normal heart sounds.  Pulmonary:     Effort: Pulmonary effort is normal. No respiratory  distress.     Breath sounds: Normal breath sounds. No wheezing.  Abdominal:     General: Bowel sounds are normal. There is no distension.     Palpations: Abdomen is soft. There is no mass.     Tenderness: There is no abdominal tenderness. There is no guarding or rebound.  Musculoskeletal:        General: No tenderness. Normal range of motion.     Cervical back: Normal range of motion and neck supple.  Skin:    General: Skin is warm.     Coloration: Skin is pale.  Neurological:     Mental Status: She is alert and oriented to person, place, and time.  Psychiatric:        Mood and Affect: Affect normal.     LABORATORY DATA:  I have reviewed the data as listed Lab Results  Component Value Date   WBC 4.4 07/08/2020   HGB 10.4 (L) 07/08/2020   HCT 32.4 (L) 07/08/2020   MCV 102.2 (H) 07/08/2020   PLT 283 07/08/2020   Recent Labs    05/20/20 0839 05/22/20 0829 06/03/20 1351 06/03/20 1351 06/17/20 1516 06/17/20 1516 06/24/20 1504 07/01/20 1301 07/08/20 1435  NA 127*   < > 137   < > 138   < > 140 139 140  K 3.8   < > 4.3   < > 3.6   < > 3.5 3.9 3.8  CL 96*   < > 104   < > 104   < > 105 105 106  CO2 21*   < > 26   < > 27   < > '27 26 27  ' GLUCOSE 114*   < > 113*   < > 134*   < > 101* 115* 127*  BUN 29*   < > 15   < > 13   < > '8 14 17  ' CREATININE 1.23*   < > 1.10*   < > 0.95   < > 1.08* 0.97 1.10*  CALCIUM 8.2*   < > 8.2*   < > 8.5*   < > 8.6* 8.5* 8.8*  GFRNONAA 45*   < > 51*   < > >60   < >  52* 59* 51*  GFRAA 52*   < > 59*  --  >60  --  >60  --   --   PROT 7.6  --  7.3   < > 7.7   < > 7.7 7.9 8.2*  ALBUMIN 3.3*  --  3.5   < > 3.9   < > 4.1 3.9 4.3  AST 86*  --  19   < > 17   < > '18 19 22  ' ALT 57*  --  20   < > 15   < > '15 14 17  ' ALKPHOS 32*  --  43   < > 51   < > 50 47 49  BILITOT 1.7*  --  1.6*   < > 1.0   < > 1.1 1.4* 1.7*  BILIDIR 0.5*  --   --   --   --   --   --   --   --   IBILI 1.2*  --   --   --   --   --   --   --   --    < > = values in this interval not  displayed.     No results found.  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 POSITIVE  For SF3B1; R-IPSS-low risk.  Poor response to Aranesp; HOLD Aranesp 500 mcg weekly subcu injections; as responding well to lenalidomide 10 mg a day-tolerating fairly well except for neutropenia/ mild diarrhea [see below]  # Hemoglobin is 10.4;  White count and platelets are normal..  Monitor closely. Continue Revlimid days-1-21; 7 days OFF.  Patient currently on Revlimid [started on 10/05]-10 mg; 3 weeks on 1 week off.   #Neutropenia secondary to Revlimid-ANC 1.1 today.  If significant neutropenia noted next week; would recommend 2 weeks on 2 weeks of Revlimid.  # Diarrhea-G-1; sec to revlimid- monitor for now.   # DISPOSITION:  # HOLD Aranesp today. # 1 week- cbc; possible aranesp # 2 weeks- cbc; possible aranesp # follow up in 3 weeks  MD; labs- cbc/Cmp;LDH;HOLD tube;possible; Aranesp SQ- Dr.B  All questions were answered. The patient knows to call the clinic with any problems, questions or concerns.   Cammie Sickle, MD 07/08/2020 4:34 PM

## 2020-07-08 NOTE — Assessment & Plan Note (Addendum)
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  Poor response to Aranesp; HOLD Aranesp 500 mcg weekly subcu injections; as responding well to lenalidomide 10 mg a day-tolerating fairly well except for neutropenia/ mild diarrhea [see below]  # Hemoglobin is 10.4;  White count and platelets are normal..  Monitor closely. Continue Revlimid days-1-21; 7 days OFF.  Patient currently on Revlimid [started on 10/05]-10 mg; 3 weeks on 1 week off.   #Neutropenia secondary to Revlimid-ANC 1.1 today.  If significant neutropenia noted next week; would recommend 2 weeks on 2 weeks of Revlimid.  # Diarrhea-G-1; sec to revlimid- monitor for now.   # DISPOSITION:  # HOLD Aranesp today. # 1 week- cbc; possible aranesp # 2 weeks- cbc; possible aranesp # follow up in 3 weeks  MD; labs- cbc/Cmp;LDH;HOLD tube;possible; Aranesp SQ- Dr.B

## 2020-07-11 LAB — SAMPLE TO BLOOD BANK

## 2020-07-15 ENCOUNTER — Other Ambulatory Visit: Payer: Self-pay

## 2020-07-15 ENCOUNTER — Inpatient Hospital Stay: Payer: PPO

## 2020-07-15 VITALS — BP 104/65 | HR 73

## 2020-07-15 DIAGNOSIS — D4620 Refractory anemia with excess of blasts, unspecified: Secondary | ICD-10-CM

## 2020-07-15 DIAGNOSIS — I50811 Acute right heart failure: Secondary | ICD-10-CM

## 2020-07-15 LAB — CBC WITH DIFFERENTIAL/PLATELET
Abs Immature Granulocytes: 0.03 10*3/uL (ref 0.00–0.07)
Basophils Absolute: 0.2 10*3/uL — ABNORMAL HIGH (ref 0.0–0.1)
Basophils Relative: 3 %
Eosinophils Absolute: 0.7 10*3/uL — ABNORMAL HIGH (ref 0.0–0.5)
Eosinophils Relative: 12 %
HCT: 29.1 % — ABNORMAL LOW (ref 36.0–46.0)
Hemoglobin: 9.6 g/dL — ABNORMAL LOW (ref 12.0–15.0)
Immature Granulocytes: 1 %
Lymphocytes Relative: 30 %
Lymphs Abs: 1.9 10*3/uL (ref 0.7–4.0)
MCH: 33.9 pg (ref 26.0–34.0)
MCHC: 33 g/dL (ref 30.0–36.0)
MCV: 102.8 fL — ABNORMAL HIGH (ref 80.0–100.0)
Monocytes Absolute: 1.4 10*3/uL — ABNORMAL HIGH (ref 0.1–1.0)
Monocytes Relative: 24 %
Neutro Abs: 1.8 10*3/uL (ref 1.7–7.7)
Neutrophils Relative %: 30 %
Platelets: 338 10*3/uL (ref 150–400)
RBC: 2.83 MIL/uL — ABNORMAL LOW (ref 3.87–5.11)
RDW: 22.4 % — ABNORMAL HIGH (ref 11.5–15.5)
WBC: 5.9 10*3/uL (ref 4.0–10.5)
nRBC: 0 % (ref 0.0–0.2)

## 2020-07-15 LAB — COMPREHENSIVE METABOLIC PANEL
ALT: 17 U/L (ref 0–44)
AST: 18 U/L (ref 15–41)
Albumin: 4.2 g/dL (ref 3.5–5.0)
Alkaline Phosphatase: 50 U/L (ref 38–126)
Anion gap: 6 (ref 5–15)
BUN: 12 mg/dL (ref 8–23)
CO2: 27 mmol/L (ref 22–32)
Calcium: 8.5 mg/dL — ABNORMAL LOW (ref 8.9–10.3)
Chloride: 106 mmol/L (ref 98–111)
Creatinine, Ser: 0.83 mg/dL (ref 0.44–1.00)
GFR, Estimated: >60 mL/min (ref >60)
Glucose, Bld: 132 mg/dL — ABNORMAL HIGH (ref 70–99)
Potassium: 3.5 mmol/L (ref 3.5–5.1)
Sodium: 139 mmol/L (ref 135–145)
Total Bilirubin: 1.2 mg/dL (ref 0.3–1.2)
Total Protein: 7.9 g/dL (ref 6.5–8.1)

## 2020-07-15 MED ORDER — DARBEPOETIN ALFA 500 MCG/ML IJ SOSY
500.0000 ug | PREFILLED_SYRINGE | Freq: Once | INTRAMUSCULAR | Status: AC
  Administered 2020-07-15: 500 ug via SUBCUTANEOUS
  Filled 2020-07-15: qty 1

## 2020-07-21 ENCOUNTER — Other Ambulatory Visit: Payer: Self-pay

## 2020-07-21 DIAGNOSIS — D4620 Refractory anemia with excess of blasts, unspecified: Secondary | ICD-10-CM

## 2020-07-21 MED ORDER — LENALIDOMIDE 10 MG PO CAPS: 10.0000 mg | ORAL_CAPSULE | Freq: Every day | ORAL | 0 refills | Status: DC

## 2020-07-22 ENCOUNTER — Inpatient Hospital Stay: Payer: PPO

## 2020-07-22 ENCOUNTER — Other Ambulatory Visit: Payer: Self-pay

## 2020-07-22 DIAGNOSIS — I50811 Acute right heart failure: Secondary | ICD-10-CM

## 2020-07-22 DIAGNOSIS — D4620 Refractory anemia with excess of blasts, unspecified: Secondary | ICD-10-CM | POA: Diagnosis not present

## 2020-07-22 LAB — CBC WITH DIFFERENTIAL/PLATELET
Abs Immature Granulocytes: 0.14 10*3/uL — ABNORMAL HIGH (ref 0.00–0.07)
Basophils Absolute: 0.1 10*3/uL (ref 0.0–0.1)
Basophils Relative: 2 %
Eosinophils Absolute: 0.6 10*3/uL — ABNORMAL HIGH (ref 0.0–0.5)
Eosinophils Relative: 10 %
HCT: 28.8 % — ABNORMAL LOW (ref 36.0–46.0)
Hemoglobin: 9.5 g/dL — ABNORMAL LOW (ref 12.0–15.0)
Immature Granulocytes: 2 %
Lymphocytes Relative: 29 %
Lymphs Abs: 1.8 10*3/uL (ref 0.7–4.0)
MCH: 34.8 pg — ABNORMAL HIGH (ref 26.0–34.0)
MCHC: 33 g/dL (ref 30.0–36.0)
MCV: 105.5 fL — ABNORMAL HIGH (ref 80.0–100.0)
Monocytes Absolute: 0.9 10*3/uL (ref 0.1–1.0)
Monocytes Relative: 15 %
Neutro Abs: 2.6 10*3/uL (ref 1.7–7.7)
Neutrophils Relative %: 42 %
Platelets: 185 10*3/uL (ref 150–400)
RBC: 2.73 MIL/uL — ABNORMAL LOW (ref 3.87–5.11)
RDW: 22.4 % — ABNORMAL HIGH (ref 11.5–15.5)
WBC: 6.2 10*3/uL (ref 4.0–10.5)
nRBC: 1.1 % — ABNORMAL HIGH (ref 0.0–0.2)

## 2020-07-22 LAB — COMPREHENSIVE METABOLIC PANEL
ALT: 17 U/L (ref 0–44)
AST: 21 U/L (ref 15–41)
Albumin: 4.2 g/dL (ref 3.5–5.0)
Alkaline Phosphatase: 46 U/L (ref 38–126)
Anion gap: 8 (ref 5–15)
BUN: 12 mg/dL (ref 8–23)
CO2: 26 mmol/L (ref 22–32)
Calcium: 8.5 mg/dL — ABNORMAL LOW (ref 8.9–10.3)
Chloride: 104 mmol/L (ref 98–111)
Creatinine, Ser: 1.01 mg/dL — ABNORMAL HIGH (ref 0.44–1.00)
GFR, Estimated: 60 mL/min — ABNORMAL LOW (ref >60)
Glucose, Bld: 130 mg/dL — ABNORMAL HIGH (ref 70–99)
Potassium: 3.7 mmol/L (ref 3.5–5.1)
Sodium: 138 mmol/L (ref 135–145)
Total Bilirubin: 1.2 mg/dL (ref 0.3–1.2)
Total Protein: 7.9 g/dL (ref 6.5–8.1)

## 2020-07-22 LAB — SAMPLE TO BLOOD BANK

## 2020-07-22 MED ORDER — DARBEPOETIN ALFA 500 MCG/ML IJ SOSY
500.0000 ug | PREFILLED_SYRINGE | Freq: Once | INTRAMUSCULAR | Status: AC
  Administered 2020-07-22: 500 ug via SUBCUTANEOUS
  Filled 2020-07-22: qty 1

## 2020-07-23 DIAGNOSIS — J180 Bronchopneumonia, unspecified organism: Secondary | ICD-10-CM | POA: Diagnosis not present

## 2020-07-25 ENCOUNTER — Other Ambulatory Visit: Payer: Self-pay | Admitting: Nurse Practitioner

## 2020-07-25 ENCOUNTER — Telehealth: Payer: Self-pay | Admitting: *Deleted

## 2020-07-25 MED ORDER — CYCLOBENZAPRINE HCL 5 MG PO TABS: 5.0000 mg | ORAL_TABLET | Freq: Three times a day (TID) | ORAL | 0 refills | Status: DC | PRN

## 2020-07-25 NOTE — Telephone Encounter (Signed)
I do not recommend ibuprofen. She can take tylenol and I will send in a prescription for flexeril.

## 2020-07-25 NOTE — Telephone Encounter (Signed)
Patient called stating that she thinks she has pulled something. Sh is complaining of pain in her right side going into her groin after lifting a box to put in the closet yesterday. She is asking if in this circumstance if she can take ibuprofen or have a muscle relaxer since she has been told not to take it. Please advise

## 2020-07-25 NOTE — Telephone Encounter (Signed)
Call returned to patient and informed of practioner response. She is in agreement to try and was told to call back if needed

## 2020-07-28 ENCOUNTER — Other Ambulatory Visit: Payer: Self-pay

## 2020-07-28 DIAGNOSIS — D4620 Refractory anemia with excess of blasts, unspecified: Secondary | ICD-10-CM

## 2020-07-29 ENCOUNTER — Inpatient Hospital Stay: Payer: PPO

## 2020-07-29 ENCOUNTER — Encounter: Payer: Self-pay | Admitting: Internal Medicine

## 2020-07-29 ENCOUNTER — Other Ambulatory Visit: Payer: Self-pay

## 2020-07-29 ENCOUNTER — Inpatient Hospital Stay: Payer: PPO | Attending: Internal Medicine

## 2020-07-29 ENCOUNTER — Inpatient Hospital Stay (HOSPITAL_BASED_OUTPATIENT_CLINIC_OR_DEPARTMENT_OTHER): Payer: PPO | Admitting: Internal Medicine

## 2020-07-29 DIAGNOSIS — Z79899 Other long term (current) drug therapy: Secondary | ICD-10-CM | POA: Insufficient documentation

## 2020-07-29 DIAGNOSIS — D696 Thrombocytopenia, unspecified: Secondary | ICD-10-CM | POA: Diagnosis not present

## 2020-07-29 DIAGNOSIS — K521 Toxic gastroenteritis and colitis: Secondary | ICD-10-CM | POA: Insufficient documentation

## 2020-07-29 DIAGNOSIS — R5381 Other malaise: Secondary | ICD-10-CM | POA: Diagnosis not present

## 2020-07-29 DIAGNOSIS — D708 Other neutropenia: Secondary | ICD-10-CM | POA: Diagnosis not present

## 2020-07-29 DIAGNOSIS — Z7982 Long term (current) use of aspirin: Secondary | ICD-10-CM | POA: Diagnosis not present

## 2020-07-29 DIAGNOSIS — D4620 Refractory anemia with excess of blasts, unspecified: Secondary | ICD-10-CM | POA: Diagnosis not present

## 2020-07-29 DIAGNOSIS — I50811 Acute right heart failure: Secondary | ICD-10-CM

## 2020-07-29 DIAGNOSIS — R5383 Other fatigue: Secondary | ICD-10-CM | POA: Insufficient documentation

## 2020-07-29 DIAGNOSIS — M79604 Pain in right leg: Secondary | ICD-10-CM | POA: Diagnosis not present

## 2020-07-29 LAB — CBC WITH DIFFERENTIAL/PLATELET
Abs Immature Granulocytes: 0.03 10*3/uL (ref 0.00–0.07)
Basophils Absolute: 0.2 10*3/uL — ABNORMAL HIGH (ref 0.0–0.1)
Basophils Relative: 4 %
Eosinophils Absolute: 0.3 10*3/uL (ref 0.0–0.5)
Eosinophils Relative: 6 %
HCT: 33.4 % — ABNORMAL LOW (ref 36.0–46.0)
Hemoglobin: 11.1 g/dL — ABNORMAL LOW (ref 12.0–15.0)
Immature Granulocytes: 1 %
Lymphocytes Relative: 40 %
Lymphs Abs: 1.9 10*3/uL (ref 0.7–4.0)
MCH: 35.5 pg — ABNORMAL HIGH (ref 26.0–34.0)
MCHC: 33.2 g/dL (ref 30.0–36.0)
MCV: 106.7 fL — ABNORMAL HIGH (ref 80.0–100.0)
Monocytes Absolute: 0.7 10*3/uL (ref 0.1–1.0)
Monocytes Relative: 15 %
Neutro Abs: 1.6 10*3/uL — ABNORMAL LOW (ref 1.7–7.7)
Neutrophils Relative %: 34 %
Platelets: 140 10*3/uL — ABNORMAL LOW (ref 150–400)
RBC: 3.13 MIL/uL — ABNORMAL LOW (ref 3.87–5.11)
RDW: 22.5 % — ABNORMAL HIGH (ref 11.5–15.5)
WBC: 4.6 10*3/uL (ref 4.0–10.5)
nRBC: 1.1 % — ABNORMAL HIGH (ref 0.0–0.2)

## 2020-07-29 LAB — COMPREHENSIVE METABOLIC PANEL
ALT: 15 U/L (ref 0–44)
AST: 18 U/L (ref 15–41)
Albumin: 4.3 g/dL (ref 3.5–5.0)
Alkaline Phosphatase: 45 U/L (ref 38–126)
Anion gap: 7 (ref 5–15)
BUN: 15 mg/dL (ref 8–23)
CO2: 26 mmol/L (ref 22–32)
Calcium: 9.3 mg/dL (ref 8.9–10.3)
Chloride: 104 mmol/L (ref 98–111)
Creatinine, Ser: 1.07 mg/dL — ABNORMAL HIGH (ref 0.44–1.00)
GFR, Estimated: 56 mL/min — ABNORMAL LOW (ref >60)
Glucose, Bld: 138 mg/dL — ABNORMAL HIGH (ref 70–99)
Potassium: 4.1 mmol/L (ref 3.5–5.1)
Sodium: 137 mmol/L (ref 135–145)
Total Bilirubin: 1.1 mg/dL (ref 0.3–1.2)
Total Protein: 8 g/dL (ref 6.5–8.1)

## 2020-07-29 LAB — LACTATE DEHYDROGENASE: LDH: 127 U/L (ref 98–192)

## 2020-07-29 NOTE — Assessment & Plan Note (Addendum)
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 Poor response to Aranesp; also on Aranesp 500 mcg weekly subcu injections; as responding well to lenalidomide 10 mg a day-tolerating fairly well except for neutropenia/ mild diarrhea [see below]   # Hemoglobin is 11.1; HOLD Aranesp.  Monitor closely. Continue Revlimid days-1-21; 7 days OFF.  Patient currently on Revlimid [start 11/02-TODAY]-10 mg; 3 weeks on 1 week off.   #Neutropenia secondary to Revlimid-ANC 1.6; Thrombocytopenia-  140s;STABLE; sec to revlimid- monitor for now.   # Right LE pain/sciatica- worse; March 2021- x-rays- wnl. On tylenol/advil [3/3]continue flexril; Right UE- pain T/N- ? Etiology; reluctant with MRI. Plan PT if not improving.   # Diarrhea-G-1; sec to revlimid- monitor for now.   # DISPOSITION:  # follow up in 2 weeks  MD; labs- cbc/Cmp;LDH;HOLD tube;possible; Aranesp SQ- Dr.B

## 2020-07-29 NOTE — Progress Notes (Signed)
Still having groin pain in right side that has spread to back side buttock area and down leg. She thinks it could be sciatica again rather than muscle related.

## 2020-07-29 NOTE — Progress Notes (Signed)
[facility] CONSULT NOTE  Patient Care Team: [name], [name], MD as PCP - General Rogue Bussing, [name], MD as Consulting Physician (Hematology and Oncology)  CHIEF COMPLAINTS/PURPOSE OF CONSULTATION: Anemia   Oncology History Overview Note   # July 2020-myelodysplastic syndrome with ringed sideroblasts-Normal karyotype; no blasts [IPSS R-Very low risk ~median survival 8.3 years]; iron studies K34 folic acid myeloma panel normal; pyridoxine levels/copper/zinc-WNL. Erythropoietin levels-60. 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  female history of low-grade MDS anemia with ring sideroblasts currently on Aranesp/Revlimid is here for follow-up.  Patient complains of worsening pain in her right lower extremity; buttocks radiating to the legs.  Worse with movement.  Denies any weakness.  No falls.  Also complains of stiffness around the right shoulder.   Otherwise denies any swelling in the legs.  Denies any nausea vomiting abdominal pain.  No fevers or chills.  No diarrhea  Review of Systems  Constitutional: Positive for malaise/fatigue. Negative for chills, diaphoresis and fever.  HENT: Negative for nosebleeds and sore throat.   Eyes: Negative  for double vision.  Respiratory: Negative for cough, hemoptysis, sputum production, shortness of breath and wheezing.   Cardiovascular: Negative for chest pain and orthopnea.  Gastrointestinal: Negative for abdominal pain, blood in stool, constipation, diarrhea, heartburn, melena and vomiting.  Genitourinary: Negative for dysuria, frequency and urgency.  Musculoskeletal: Positive for back pain and joint pain.  Skin: Negative.  Negative for itching and rash.  Neurological: Negative for dizziness, tingling, focal weakness, weakness and headaches.  Endo/Heme/Allergies: Does not bruise/bleed easily.  Psychiatric/Behavioral: Negative for depression. The patient is not nervous/anxious and does not have insomnia.     MEDICAL HISTORY:  Past Medical History:  Diagnosis Date  . Allergic rhinitis, cause unspecified   . Anemia, unspecified   . Carpal tunnel syndrome   . Cystitis, unspecified   . Family history of osteoporosis   . PONV (postoperative nausea and vomiting)   . Postnasal drip   . Syncope and collapse     SURGICAL HISTORY: Past Surgical History:  Procedure Laterality Date  . CARPAL TUNNEL RELEASE    . COLONOSCOPY  2005  . COLONOSCOPY WITH PROPOFOL N/A 07/31/2015   Procedure: COLONOSCOPY WITH PROPOFOL;  Surgeon: RRonald Lobo MD;  Location: [facility];  Service: Endoscopy;  Laterality: N/A;  . DIAGNOSTIC LAPAROSCOPY     dx lack of pregnancy   . EYE SURGERY     lasik, cataract, prk,yag procedure    SOCIAL HISTORY: Social History   Socioeconomic History  . Marital status: Married    Spouse name: Not on file  . Number of children: Not on file  . Years of education: Not on file  . Highest education level: Not on file  Occupational History  . Not on file  Tobacco Use  . Smoking status: Never Smoker  .  Smokeless tobacco: Never Used  Vaping Use  . Vaping Use: Never used  Substance and Sexual Activity  . Alcohol use: Yes    Alcohol/week: 0.0 standard drinks     Comment: Rare  . Drug use: No  . Sexual activity: Not Currently  Other Topics Concern  . Not on file  Social History Narrative   No regular exercise      Drinks lots of [employer]         Social Determinants of Health   Financial Resource Strain: Low Risk   . Difficulty of Paying Living Expenses: Not hard at all  Food Insecurity: No Food Insecurity  . Worried About Charity fundraiser in the Last Year: Never true  . Ran Out of Food in the Last Year: Never true  Transportation Needs: No Transportation Needs  . Lack of Transportation (Medical): No  . Lack of Transportation (Non-Medical): No  Physical Activity: Inactive  . Days of Exercise per Week: 0 days  . Minutes of Exercise per Session: 0 min  Stress: No Stress Concern Present  . Feeling of Stress : Not at all  Social Connections:   . Frequency of Communication with Friends and Family: Not on file  . Frequency of Social Gatherings with Friends and Family: Not on file  . Attends Religious Services: Not on file  . Active Member of Clubs or Organizations: Not on file  . Attends Archivist Meetings: Not on file  . Marital Status: Not on file  Intimate Partner Violence: Not At Risk  . Fear of Current or Ex-Partner: No  . Emotionally Abused: No  . Physically Abused: No  . Sexually Abused: No    FAMILY HISTORY: Family History  Problem Relation Age of Onset  . Osteoporosis Mother   . Stroke Mother   . Coronary artery disease Father   . Stroke Father 84  . Diabetes Other        Aunts and uncles  . Breast cancer Paternal Aunt   . Breast cancer Maternal Aunt   . Arthritis/Rheumatoid Child   . Breast cancer Cousin     ALLERGIES:  is allergic to codeine.  MEDICATIONS:  Current Outpatient Medications  Medication Sig Dispense Refill  . acetaminophen (TYLENOL) 500 MG tablet Take 500-1,000 mg by mouth every 6 (six) hours as needed for mild pain or headache.    Marland Kitchen aspirin EC 81 MG tablet Take 81 mg by mouth at  bedtime. Swallow whole.     Marland Kitchen BIOTIN PO Take 1 tablet by mouth 3 (three) times a week.     . Calcium Carb-Cholecalciferol (CALCIUM 600+D3 PO) Take 1 tablet by mouth 3 (three) times a week.     . cyclobenzaprine (FLEXERIL) 5 MG tablet Take 1 tablet (5 mg total) by mouth 3 (three) times daily as needed for muscle spasms. 30 tablet 0  . Darbepoetin Alfa (ARANESP) 500 MCG/ML SOSY injection Inject 500 mcg into the skin See admin instructions. Inject 500 mcg into the skin every other week    . lenalidomide (REVLIMID) 10 MG capsule Take 1 capsule (10 mg total) by mouth daily. 3 weeks-On and 1 week-OFF. 21 capsule 0  . levothyroxine (SYNTHROID) 25 MCG tablet Take 1 tablet (25 mcg total) by mouth daily before breakfast. 90 tablet 3  . montelukast (SINGULAIR) 10 MG tablet Take 1 tablet (10 mg total) by mouth at bedtime. For allergies. 30 tablet 1  . ondansetron (ZOFRAN ODT) 8 MG disintegrating tablet Take 1 tablet (  8 mg total) by mouth every 8 (eight) hours as needed for nausea or vomiting. 30 tablet 0  . sodium chloride (OCEAN) 0.65 % SOLN nasal spray Place 1 spray into both nostrils as needed for congestion.    . Sulfacetamide Sodium, Acne, 10 % LOTN Apply 1 application topically See admin instructions. Apply to face once a day as directed    . cetirizine (ZYRTEC) 10 MG tablet Take 10 mg by mouth daily. (Patient not taking: Reported on 06/24/2020)    . loratadine (CLARITIN) 10 MG tablet Take 10 mg by mouth daily.  (Patient not taking: Reported on 07/08/2020)     No current facility-administered medications for this visit.      PHYSICAL EXAMINATION:   Vitals:   07/29/20 1047  BP: (!) 120/49  Pulse: 65  Resp: 16  Temp: (!) 96.9 F (36.1 C)  SpO2: 100%   Filed Weights   07/29/20 1047  Weight: 149 lb (67.6 kg)    Physical Exam Constitutional:      Comments: Walk independently.  Accompanied by husband.  HENT:     Head: Normocephalic and atraumatic.     Mouth/Throat:     Pharynx: No  oropharyngeal exudate.  Eyes:     Pupils: Pupils are equal, round, and reactive to light.  Cardiovascular:     Rate and Rhythm: Normal rate and regular rhythm.     Pulses: Normal pulses.     Heart sounds: Normal heart sounds.  Pulmonary:     Effort: Pulmonary effort is normal. No respiratory distress.     Breath sounds: Normal breath sounds. No wheezing.  Abdominal:     General: Bowel sounds are normal. There is no distension.     Palpations: Abdomen is soft. There is no mass.     Tenderness: There is no abdominal tenderness. There is no guarding or rebound.  Musculoskeletal:        General: No tenderness. Normal range of motion.     Cervical back: Normal range of motion and neck supple.  Skin:    General: Skin is warm.  Neurological:     Mental Status: She is alert and oriented to person, place, and time.  Psychiatric:        Mood and Affect: Affect normal.     LABORATORY DATA:  I have reviewed the data as listed Lab Results  Component Value Date   WBC 4.6 07/29/2020   HGB 11.1 (L) 07/29/2020   HCT 33.4 (L) 07/29/2020   MCV 106.7 (H) 07/29/2020   PLT 140 (L) 07/29/2020   Recent Labs    05/20/20 0839 05/22/20 0829 06/03/20 1351 06/03/20 1351 06/17/20 1516 06/17/20 1516 06/24/20 1504 07/01/20 1301 07/15/20 1406 07/22/20 1349 07/29/20 1030  NA 127*   < > 137   < > 138   < > 140   < > 139 138 137  K 3.8   < > 4.3   < > 3.6   < > 3.5   < > 3.5 3.7 4.1  CL 96*   < > 104   < > 104   < > 105   < > 106 104 104  CO2 21*   < > 26   < > 27   < > 27   < > '27 26 26  ' GLUCOSE 114*   < > 113*   < > 134*   < > 101*   < > 132* 130* 138*  BUN 29*   < >  15   < > 13   < > 8   < > '12 12 15  ' CREATININE 1.23*   < > 1.10*   < > 0.95   < > 1.08*   < > 0.83 1.01* 1.07*  CALCIUM 8.2*   < > 8.2*   < > 8.5*   < > 8.6*   < > 8.5* 8.5* 9.3  GFRNONAA 45*   < > 51*   < > >60   < > 52*   < > >60 60* 56*  GFRAA 52*   < > 59*  --  >60  --  >60  --   --   --   --   PROT 7.6  --  7.3   < > 7.7    < > 7.7   < > 7.9 7.9 8.0  ALBUMIN 3.3*  --  3.5   < > 3.9   < > 4.1   < > 4.2 4.2 4.3  AST 86*  --  19   < > 17   < > 18   < > '18 21 18  ' ALT 57*  --  20   < > 15   < > 15   < > '17 17 15  ' ALKPHOS 32*  --  43   < > 51   < > 50   < > 50 46 45  BILITOT 1.7*  --  1.6*   < > 1.0   < > 1.1   < > 1.2 1.2 1.1  BILIDIR 0.5*  --   --   --   --   --   --   --   --   --   --   IBILI 1.2*  --   --   --   --   --   --   --   --   --   --    < > = values in this interval not displayed.     No results found.  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 POSITIVE  For SF3B1; R-IPSS-low risk. Poor response to Aranesp; also on Aranesp 500 mcg weekly subcu injections; as responding well to lenalidomide 10 mg a day-tolerating fairly well except for neutropenia/ mild diarrhea [see below]   # Hemoglobin is 11.1; HOLD Aranesp.  Monitor closely. Continue Revlimid days-1-21; 7 days OFF.  Patient currently on Revlimid [start 11/02-TODAY]-10 mg; 3 weeks on 1 week off.   #Neutropenia secondary to Revlimid-ANC 1.6; Thrombocytopenia-  140s;STABLE; sec to revlimid- monitor for now.   # Right LE pain/sciatica- worse; March 2021- x-rays- wnl. On tylenol/advil [3/3]continue flexril; Right UE- pain T/N- ? Etiology; reluctant with MRI. Plan PT if not improving.   # Diarrhea-G-1; sec to revlimid- monitor for now.   # DISPOSITION:  # follow up in 2 weeks  MD; labs- cbc/Cmp;LDH;HOLD tube;possible; Aranesp SQ- Dr.B  All questions were answered. The patient knows to call the clinic with any problems, questions or concerns.   Cammie Sickle, MD 07/29/2020 11:54 AM

## 2020-07-30 LAB — SAMPLE TO BLOOD BANK

## 2020-08-12 ENCOUNTER — Inpatient Hospital Stay (HOSPITAL_BASED_OUTPATIENT_CLINIC_OR_DEPARTMENT_OTHER): Payer: PPO | Admitting: Internal Medicine

## 2020-08-12 ENCOUNTER — Inpatient Hospital Stay: Payer: PPO

## 2020-08-12 DIAGNOSIS — I50811 Acute right heart failure: Secondary | ICD-10-CM

## 2020-08-12 DIAGNOSIS — D4620 Refractory anemia with excess of blasts, unspecified: Secondary | ICD-10-CM

## 2020-08-12 LAB — COMPREHENSIVE METABOLIC PANEL
ALT: 20 U/L (ref 0–44)
AST: 22 U/L (ref 15–41)
Albumin: 4.2 g/dL (ref 3.5–5.0)
Alkaline Phosphatase: 44 U/L (ref 38–126)
Anion gap: 9 (ref 5–15)
BUN: 15 mg/dL (ref 8–23)
CO2: 27 mmol/L (ref 22–32)
Calcium: 9.2 mg/dL (ref 8.9–10.3)
Chloride: 103 mmol/L (ref 98–111)
Creatinine, Ser: 1 mg/dL (ref 0.44–1.00)
GFR, Estimated: >60 mL/min (ref >60)
Glucose, Bld: 117 mg/dL — ABNORMAL HIGH (ref 70–99)
Potassium: 3.9 mmol/L (ref 3.5–5.1)
Sodium: 139 mmol/L (ref 135–145)
Total Bilirubin: 1.2 mg/dL (ref 0.3–1.2)
Total Protein: 7.9 g/dL (ref 6.5–8.1)

## 2020-08-12 LAB — CBC WITH DIFFERENTIAL/PLATELET
Abs Immature Granulocytes: 0.01 10*3/uL (ref 0.00–0.07)
Basophils Absolute: 0.1 10*3/uL (ref 0.0–0.1)
Basophils Relative: 2 %
Eosinophils Absolute: 0.5 10*3/uL (ref 0.0–0.5)
Eosinophils Relative: 11 %
HCT: 32.6 % — ABNORMAL LOW (ref 36.0–46.0)
Hemoglobin: 11 g/dL — ABNORMAL LOW (ref 12.0–15.0)
Immature Granulocytes: 0 %
Lymphocytes Relative: 40 %
Lymphs Abs: 1.7 10*3/uL (ref 0.7–4.0)
MCH: 35.7 pg — ABNORMAL HIGH (ref 26.0–34.0)
MCHC: 33.7 g/dL (ref 30.0–36.0)
MCV: 105.8 fL — ABNORMAL HIGH (ref 80.0–100.0)
Monocytes Absolute: 0.7 10*3/uL (ref 0.1–1.0)
Monocytes Relative: 15 %
Neutro Abs: 1.4 10*3/uL — ABNORMAL LOW (ref 1.7–7.7)
Neutrophils Relative %: 32 %
Platelets: 162 10*3/uL (ref 150–400)
RBC: 3.08 MIL/uL — ABNORMAL LOW (ref 3.87–5.11)
RDW: 18.6 % — ABNORMAL HIGH (ref 11.5–15.5)
WBC: 4.3 10*3/uL (ref 4.0–10.5)
nRBC: 0 % (ref 0.0–0.2)

## 2020-08-12 NOTE — Progress Notes (Signed)
[facility] CONSULT NOTE  Patient Care Team: [name], [name], MD as PCP - General Rogue Bussing, [name], MD as Consulting Physician (Hematology and Oncology)  CHIEF COMPLAINTS/PURPOSE OF CONSULTATION: Anemia   Oncology History Overview Note   # July 2020-myelodysplastic syndrome with ringed sideroblasts-Normal karyotype; no blasts [IPSS R-Very low risk ~median survival 8.3 years]; iron studies V42 folic acid myeloma panel normal; pyridoxine levels/copper/zinc-WNL. Erythropoietin levels-60. 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  female history of low-grade MDS anemia with ring sideroblasts currently on Aranesp/Revlimid is here for follow-up.  Patient states her right lower extremity sciatic pain is improved.  She is taking NSAIDs only as needed.  Denies any weakness.  No falls.  No worsening diarrhea.  No headaches.  No skin rash.  Review of Systems  Constitutional: Positive for malaise/fatigue. Negative for chills, diaphoresis and fever.  HENT: Negative for nosebleeds and sore throat.   Eyes: Negative for double vision.  Respiratory: Negative for cough, hemoptysis, sputum production, shortness of breath and wheezing.   Cardiovascular:  Negative for chest pain and orthopnea.  Gastrointestinal: Negative for abdominal pain, blood in stool, constipation, diarrhea, heartburn, melena and vomiting.  Genitourinary: Negative for dysuria, frequency and urgency.  Musculoskeletal: Positive for back pain and joint pain.  Skin: Negative.  Negative for itching and rash.  Neurological: Negative for dizziness, tingling, focal weakness, weakness and headaches.  Endo/Heme/Allergies: Does not bruise/bleed easily.  Psychiatric/Behavioral: Negative for depression. The patient is not nervous/anxious and does not have insomnia.     MEDICAL HISTORY:  Past Medical History:  Diagnosis Date  . Allergic rhinitis, cause unspecified   . Anemia, unspecified   . Carpal tunnel syndrome   . Cystitis, unspecified   . Family history of osteoporosis   . PONV (postoperative nausea and vomiting)   . Postnasal drip   . Syncope and collapse     SURGICAL HISTORY: Past Surgical History:  Procedure Laterality Date  . CARPAL TUNNEL RELEASE    . COLONOSCOPY  2005  . COLONOSCOPY WITH PROPOFOL N/A 07/31/2015   Procedure: COLONOSCOPY WITH PROPOFOL;  Surgeon: RRonald Lobo MD;  Location: [facility];  Service: Endoscopy;  Laterality: N/A;  . DIAGNOSTIC LAPAROSCOPY     dx lack of pregnancy   . EYE SURGERY     lasik, cataract, prk,yag procedure    SOCIAL HISTORY: Social History   Socioeconomic History  . Marital status: Married    Spouse name: Not on file  . Number of children: Not on file  . Years of education: Not on file  . Highest education level: Not on file  Occupational History  . Not on file  Tobacco Use  . Smoking status: Never Smoker  . Smokeless tobacco: Never Used  Vaping Use  . Vaping Use: Never used  Substance and Sexual Activity  . Alcohol use:  Yes    Alcohol/week: 0.0 standard drinks    Comment: Rare  . Drug use: No  . Sexual activity: Not Currently  Other Topics Concern  . Not on file  Social History Narrative   No  regular exercise      Drinks lots of [employer]         Social Determinants of Health   Financial Resource Strain: Low Risk   . Difficulty of Paying Living Expenses: Not hard at all  Food Insecurity: No Food Insecurity  . Worried About Charity fundraiser in the Last Year: Never true  . Ran Out of Food in the Last Year: Never true  Transportation Needs: No Transportation Needs  . Lack of Transportation (Medical): No  . Lack of Transportation (Non-Medical): No  Physical Activity: Inactive  . Days of Exercise per Week: 0 days  . Minutes of Exercise per Session: 0 min  Stress: No Stress Concern Present  . Feeling of Stress : Not at all  Social Connections:   . Frequency of Communication with Friends and Family: Not on file  . Frequency of Social Gatherings with Friends and Family: Not on file  . Attends Religious Services: Not on file  . Active Member of Clubs or Organizations: Not on file  . Attends Archivist Meetings: Not on file  . Marital Status: Not on file  Intimate Partner Violence: Not At Risk  . Fear of Current or Ex-Partner: No  . Emotionally Abused: No  . Physically Abused: No  . Sexually Abused: No    FAMILY HISTORY: Family History  Problem Relation Age of Onset  . Osteoporosis Mother   . Stroke Mother   . Coronary artery disease Father   . Stroke Father 37  . Diabetes Other        Aunts and uncles  . Breast cancer Paternal Aunt   . Breast cancer Maternal Aunt   . Arthritis/Rheumatoid Child   . Breast cancer Cousin     ALLERGIES:  is allergic to codeine.  MEDICATIONS:  Current Outpatient Medications  Medication Sig Dispense Refill  . acetaminophen (TYLENOL) 500 MG tablet Take 500-1,000 mg by mouth every 6 (six) hours as needed for mild pain or headache.    Marland Kitchen aspirin EC 81 MG tablet Take 81 mg by mouth at bedtime. Swallow whole.     Marland Kitchen BIOTIN PO Take 1 tablet by mouth 3 (three) times a week.     . Calcium Carb-Cholecalciferol (CALCIUM 600+D3 PO)  Take 1 tablet by mouth 3 (three) times a week.     . Darbepoetin Alfa (ARANESP) 500 MCG/ML SOSY injection Inject 500 mcg into the skin See admin instructions. Inject 500 mcg into the skin every week    . lenalidomide (REVLIMID) 10 MG capsule Take 1 capsule (10 mg total) by mouth daily. 3 weeks-On and 1 week-OFF. 21 capsule 0  . levothyroxine (SYNTHROID) 25 MCG tablet Take 1 tablet (25 mcg total) by mouth daily before breakfast. 90 tablet 3  . ondansetron (ZOFRAN ODT) 8 MG disintegrating tablet Take 1 tablet (8 mg total) by mouth every 8 (eight) hours as needed for nausea or vomiting. (Patient not taking: Reported on 08/12/2020) 30 tablet 0   No current facility-administered medications for this visit.      PHYSICAL EXAMINATION:   Vitals:   08/12/20 1512  BP: (!) 131/59  Pulse: 68  Temp: 98.1 F (36.7 C)  SpO2: 100%   Filed Weights   08/12/20 1512  Weight: 151 lb 12.8 oz (68.9 kg)    Physical Exam Constitutional:      Comments: Walk independently.  Accompanied by husband.  HENT:     Head: Normocephalic and atraumatic.     Mouth/Throat:     Pharynx: No oropharyngeal exudate.  Eyes:     Pupils: Pupils are equal, round, and reactive to light.  Cardiovascular:     Rate and Rhythm: Normal rate and regular rhythm.     Pulses: Normal pulses.     Heart sounds: Normal heart sounds.  Pulmonary:     Effort: Pulmonary effort is normal. No respiratory distress.     Breath sounds: Normal breath sounds. No wheezing.  Abdominal:     General: Bowel sounds are normal. There is no distension.     Palpations: Abdomen is soft. There is no mass.     Tenderness: There is no abdominal tenderness. There is no guarding or rebound.  Musculoskeletal:        General: No tenderness. Normal range of motion.     Cervical back: Normal range of motion and neck supple.  Skin:    General: Skin is warm.  Neurological:     Mental Status: She is alert and oriented to person, place, and time.   Psychiatric:        Mood and Affect: Affect normal.     LABORATORY DATA:  I have reviewed the data as listed Lab Results  Component Value Date   WBC 4.3 08/12/2020   HGB 11.0 (L) 08/12/2020   HCT 32.6 (L) 08/12/2020   MCV 105.8 (H) 08/12/2020   PLT 162 08/12/2020   Recent Labs    05/20/20 0839 05/22/20 0829 06/03/20 1351 06/03/20 1351 06/17/20 1516 06/17/20 1516 06/24/20 1504 07/01/20 1301 07/22/20 1349 07/29/20 1030 08/12/20 1503  NA 127*   < > 137   < > 138   < > 140   < > 138 137 139  K 3.8   < > 4.3   < > 3.6   < > 3.5   < > 3.7 4.1 3.9  CL 96*   < > 104   < > 104   < > 105   < > 104 104 103  CO2 21*   < > 26   < > 27   < > 27   < > '26 26 27  ' GLUCOSE 114*   < > 113*   < > 134*   < > 101*   < > 130* 138* 117*  BUN 29*   < > 15   < > 13   < > 8   < > '12 15 15  ' CREATININE 1.23*   < > 1.10*   < > 0.95   < > 1.08*   < > 1.01* 1.07* 1.00  CALCIUM 8.2*   < > 8.2*   < > 8.5*   < > 8.6*   < > 8.5* 9.3 9.2  GFRNONAA 45*   < > 51*   < > >60   < > 52*   < > 60* 56* >60  GFRAA 52*   < > 59*  --  >60  --  >60  --   --   --   --   PROT 7.6  --  7.3   < > 7.7   < > 7.7   < > 7.9 8.0 7.9  ALBUMIN 3.3*  --  3.5   < > 3.9   < >  4.1   < > 4.2 4.3 4.2  AST 86*  --  19   < > 17   < > 18   < > '21 18 22  ' ALT 57*  --  20   < > 15   < > 15   < > '17 15 20  ' ALKPHOS 32*  --  43   < > 51   < > 50   < > 46 45 44  BILITOT 1.7*  --  1.6*   < > 1.0   < > 1.1   < > 1.2 1.1 1.2  BILIDIR 0.5*  --   --   --   --   --   --   --   --   --   --   IBILI 1.2*  --   --   --   --   --   --   --   --   --   --    < > = values in this interval not displayed.     No results found.  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 POSITIVE  For SF3B1; R-IPSS-low risk. Poor response to Aranesp; currently on Aranesp 500 mcg weekly subcu injections; and  lenalidomide 10 mg a day-tolerating fairly well except for neutropenia/ mild diarrhea [see below]   #  Hemoglobin is 11.0; HOLD Aranesp.  Monitor closely. Continue Revlimid 68m; days-1-21; 7 days OFF.  Continue revlimid [started cycle on 11/02]  #Neutropenia secondary to Revlimid-ANC 1.4; Thrombocytopenia- 160s;STABLE; sec to revlimid- monitor for now.   # Right LE pain/sciatica-  March 2021- x-rays- wnl. On tylenol/advil [3/3]continue flexril; STABLE.   # Diarrhea-G-1; sec to revlimid- monitor for now; STABLE.    # DISPOSITION:  # NO Aranesp # in 2 weeks- cbc/possible aranesp # follow up in 4 weeks  MD; labs- cbc/Cmp;LDH;possible; Aranesp SQ- Dr.B  All questions were answered. The patient knows to call the clinic with any problems, questions or concerns.   GCammie Sickle MD 08/13/2020 8:26 AM

## 2020-08-12 NOTE — Assessment & Plan Note (Addendum)
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 Poor response to Aranesp; currently on Aranesp 500 mcg weekly subcu injections; and  lenalidomide 10 mg a day-tolerating fairly well except for neutropenia/ mild diarrhea [see below]   # Hemoglobin is 11.0; HOLD Aranesp.  Monitor closely. Continue Revlimid 27m; days-1-21; 7 days OFF.  Continue revlimid [started cycle on 11/02]  #Neutropenia secondary to Revlimid-ANC 1.4; Thrombocytopenia- 160s;STABLE; sec to revlimid- monitor for now.   # Right LE pain/sciatica-  March 2021- x-rays- wnl. On tylenol/advil [3/3]continue flexril; STABLE.   # Diarrhea-G-1; sec to revlimid- monitor for now; STABLE.    # DISPOSITION:  # NO Aranesp # in 2 weeks- cbc/possible aranesp # follow up in 4 weeks  MD; labs- cbc/Cmp;LDH;possible; Aranesp SQ- Dr.B

## 2020-08-12 NOTE — Progress Notes (Signed)
Wants to talk about why she still needs injections

## 2020-08-14 LAB — SAMPLE TO BLOOD BANK

## 2020-08-18 ENCOUNTER — Telehealth: Payer: Self-pay | Admitting: *Deleted

## 2020-08-18 DIAGNOSIS — D4620 Refractory anemia with excess of blasts, unspecified: Secondary | ICD-10-CM

## 2020-08-18 MED ORDER — LENALIDOMIDE 10 MG PO CAPS: 10.0000 mg | ORAL_CAPSULE | Freq: Every day | ORAL | 0 refills | Status: DC

## 2020-08-18 NOTE — Telephone Encounter (Signed)
revlimid RF submitted to [facility].  [id number]- rems survey completed.

## 2020-08-23 DIAGNOSIS — J180 Bronchopneumonia, unspecified organism: Secondary | ICD-10-CM | POA: Diagnosis not present

## 2020-08-25 ENCOUNTER — Telehealth: Payer: Self-pay | Admitting: *Deleted

## 2020-08-25 NOTE — Telephone Encounter (Signed)
Please advise Dr. B 

## 2020-08-25 NOTE — Telephone Encounter (Signed)
RN spoke with patient.  Patient was at a funeral service outside at [place] yesterday. She was exposed to a friend who was standing 3 feet away from her at the funeral.  Patient has had her Booster covid vaccine (a total of 3). She will cancel her lab/injection appointments tomorrow due to potential exposure.  Since patient was just exposed at 3 pm yesterday, patient instructed that is probably too soon to test for covid. She was instructed to wait until 5-7 days after her exposure date to be tested. If she develops symptoms of covid, she will get tested sooner. She inquired if she was a candiate for mab therapy at this time given her exposures. I explained to her that mab therapy is not available unless he is defintely positive for covid, which we do not know if she is or not at this time.   She will call our office back if she needs any additional questions or concerns.

## 2020-08-25 NOTE — Telephone Encounter (Signed)
Patient called reporting that she has an appointment tomorrow but just found out that she has been exposed to [place] and is asking what she should do regarding her appointment for Lab/ Injection. Please advise

## 2020-08-26 ENCOUNTER — Telehealth: Payer: Self-pay | Admitting: Internal Medicine

## 2020-08-26 ENCOUNTER — Inpatient Hospital Stay: Payer: PPO

## 2020-08-26 NOTE — Telephone Encounter (Signed)
On 11/29-called on patient regarding the concerns for exposure to Covid.  Agree with Nira Conn RN assessment/discussion with the patient regarding holding lab blood check on 11/30.  Monitor for symptoms closely; patient is vaccinated.  If any respiratory symptoms to inform us ASAP.  Recommend holding restarting Revlimid given the above.  Patient will call us with an update on dec 3rd re: restarting Revlimid/future appointments.

## 2020-08-29 ENCOUNTER — Telehealth: Payer: Self-pay | Admitting: *Deleted

## 2020-08-29 DIAGNOSIS — Z1152 Encounter for screening for COVID-19: Secondary | ICD-10-CM | POA: Diagnosis not present

## 2020-08-29 DIAGNOSIS — Z03818 Encounter for observation for suspected exposure to other biological agents ruled out: Secondary | ICD-10-CM | POA: Diagnosis not present

## 2020-08-29 NOTE — Telephone Encounter (Signed)
Patient has called to let us know that she is feeling fine with no symptoms. She is asking if she should still be tested for COVID and per note from earlier this week, when to restart Revlimid and future appts. Please advise

## 2020-08-29 NOTE — Telephone Encounter (Addendum)
Per Dr. Jacinto Reap pt is ok to start revlimid as long as pt has no symptoms and negative COVID test. Pt states she did have a COVID test done. Pt expressed understanding.

## 2020-09-10 ENCOUNTER — Inpatient Hospital Stay: Payer: PPO

## 2020-09-10 ENCOUNTER — Encounter: Payer: Self-pay | Admitting: Internal Medicine

## 2020-09-10 ENCOUNTER — Inpatient Hospital Stay: Payer: PPO | Attending: Internal Medicine

## 2020-09-10 ENCOUNTER — Inpatient Hospital Stay (HOSPITAL_BASED_OUTPATIENT_CLINIC_OR_DEPARTMENT_OTHER): Payer: PPO | Admitting: Internal Medicine

## 2020-09-10 DIAGNOSIS — R5381 Other malaise: Secondary | ICD-10-CM | POA: Diagnosis not present

## 2020-09-10 DIAGNOSIS — E039 Hypothyroidism, unspecified: Secondary | ICD-10-CM | POA: Diagnosis not present

## 2020-09-10 DIAGNOSIS — D4620 Refractory anemia with excess of blasts, unspecified: Secondary | ICD-10-CM | POA: Diagnosis not present

## 2020-09-10 DIAGNOSIS — R5383 Other fatigue: Secondary | ICD-10-CM | POA: Insufficient documentation

## 2020-09-10 DIAGNOSIS — Z79899 Other long term (current) drug therapy: Secondary | ICD-10-CM | POA: Diagnosis not present

## 2020-09-10 DIAGNOSIS — Z7982 Long term (current) use of aspirin: Secondary | ICD-10-CM | POA: Diagnosis not present

## 2020-09-10 DIAGNOSIS — I50811 Acute right heart failure: Secondary | ICD-10-CM

## 2020-09-10 LAB — CBC WITH DIFFERENTIAL/PLATELET
Abs Immature Granulocytes: 0.01 10*3/uL (ref 0.00–0.07)
Basophils Absolute: 0.1 10*3/uL (ref 0.0–0.1)
Basophils Relative: 2 %
Eosinophils Absolute: 0.3 10*3/uL (ref 0.0–0.5)
Eosinophils Relative: 7 %
HCT: 29.8 % — ABNORMAL LOW (ref 36.0–46.0)
Hemoglobin: 10.2 g/dL — ABNORMAL LOW (ref 12.0–15.0)
Immature Granulocytes: 0 %
Lymphocytes Relative: 39 %
Lymphs Abs: 1.9 10*3/uL (ref 0.7–4.0)
MCH: 36.2 pg — ABNORMAL HIGH (ref 26.0–34.0)
MCHC: 34.2 g/dL (ref 30.0–36.0)
MCV: 105.7 fL — ABNORMAL HIGH (ref 80.0–100.0)
Monocytes Absolute: 0.7 10*3/uL (ref 0.1–1.0)
Monocytes Relative: 14 %
Neutro Abs: 1.9 10*3/uL (ref 1.7–7.7)
Neutrophils Relative %: 38 %
Platelets: 261 10*3/uL (ref 150–400)
RBC: 2.82 MIL/uL — ABNORMAL LOW (ref 3.87–5.11)
RDW: 14.7 % (ref 11.5–15.5)
WBC: 4.9 10*3/uL (ref 4.0–10.5)
nRBC: 0 % (ref 0.0–0.2)

## 2020-09-10 LAB — COMPREHENSIVE METABOLIC PANEL
ALT: 20 U/L (ref 0–44)
AST: 22 U/L (ref 15–41)
Albumin: 4.2 g/dL (ref 3.5–5.0)
Alkaline Phosphatase: 52 U/L (ref 38–126)
Anion gap: 8 (ref 5–15)
BUN: 14 mg/dL (ref 8–23)
CO2: 28 mmol/L (ref 22–32)
Calcium: 8.9 mg/dL (ref 8.9–10.3)
Chloride: 103 mmol/L (ref 98–111)
Creatinine, Ser: 0.83 mg/dL (ref 0.44–1.00)
GFR, Estimated: >60 mL/min (ref >60)
Glucose, Bld: 102 mg/dL — ABNORMAL HIGH (ref 70–99)
Potassium: 3.8 mmol/L (ref 3.5–5.1)
Sodium: 139 mmol/L (ref 135–145)
Total Bilirubin: 0.8 mg/dL (ref 0.3–1.2)
Total Protein: 7.9 g/dL (ref 6.5–8.1)

## 2020-09-10 NOTE — Assessment & Plan Note (Addendum)
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 Poor response to Aranesp; currently on Aranesp 500 mcg weekly subcu injections; and  lenalidomide 10 mg a day-tolerating fairly well.    # Hemoglobin is 10.2; HOLD Aranesp.  Monitor closely. Continue Revlimid 71m; days-1-21; 7 days OFF.  STABLE.   # Right LE pain/sciatica- continue NSAIDs/ flexril; STABLE.    # Diarrhea-G-1; sec to revlimid- monitor for now; STABLE.    #Recent exposure to Covid positive individual; further work-up including Covid test negative.  Patient is vaccinated.  Monitor closely.  # DISPOSITION:  # NO Aranesp # in 2 weeks- cbc/possible aranesp #  in 4 weeks- cbc/possible aranesp # follow up in 6 weeks  MD; labs- cbc/Cmp;LDH;possible; Aranesp SQ- Dr.B

## 2020-09-10 NOTE — Progress Notes (Signed)
[facility] CONSULT NOTE  Patient Care Team: [name], [name], MD as PCP - General Rogue Bussing, [name], MD as Consulting Physician (Hematology and Oncology)  CHIEF COMPLAINTS/PURPOSE OF CONSULTATION: Anemia   Oncology History Overview Note   # July 2020-myelodysplastic syndrome with ringed sideroblasts-Normal karyotype; no blasts [IPSS R-Very low risk ~median survival 8.3 years]; iron studies W58 folic acid myeloma panel normal; pyridoxine levels/copper/zinc-WNL. Erythropoietin levels-60. 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  female history of low-grade MDS anemia with ring sideroblasts currently on Aranesp/Revlimid is here for follow-up.  In the interim patient had exposure to COVID positive individual.  She quarantine tested herself.  Did not have any symptoms.  Covid test was negative.   Patient is currently back on Revlimid.  Denies any nausea vomiting abdominal pain.  Mild diarrhea.  No skin rash.  Review of Systems  Constitutional: Positive for malaise/fatigue. Negative for chills, diaphoresis and fever.  HENT: Negative for nosebleeds and sore throat.   Eyes: Negative for double vision.  Respiratory: Negative for cough, hemoptysis,  sputum production, shortness of breath and wheezing.   Cardiovascular: Negative for chest pain and orthopnea.  Gastrointestinal: Negative for abdominal pain, blood in stool, constipation, diarrhea, heartburn, melena and vomiting.  Genitourinary: Negative for dysuria, frequency and urgency.  Musculoskeletal: Positive for back pain and joint pain.  Skin: Negative.  Negative for itching and rash.  Neurological: Negative for dizziness, tingling, focal weakness, weakness and headaches.  Endo/Heme/Allergies: Does not bruise/bleed easily.  Psychiatric/Behavioral: Negative for depression. The patient is not nervous/anxious and does not have insomnia.     MEDICAL HISTORY:  Past Medical History:  Diagnosis Date  . Allergic rhinitis, cause unspecified   . Anemia, unspecified   . Carpal tunnel syndrome   . Cystitis, unspecified   . Family history of osteoporosis   . PONV (postoperative nausea and vomiting)   . Postnasal drip   . Syncope and collapse     SURGICAL HISTORY: Past Surgical History:  Procedure Laterality Date  . CARPAL TUNNEL RELEASE    . COLONOSCOPY  2005  . COLONOSCOPY WITH PROPOFOL N/A 07/31/2015   Procedure: COLONOSCOPY WITH PROPOFOL;  Surgeon: RRonald Lobo MD;  Location: [facility];  Service: Endoscopy;  Laterality: N/A;  . DIAGNOSTIC LAPAROSCOPY     dx lack of pregnancy   . EYE SURGERY     lasik, cataract, prk,yag procedure    SOCIAL HISTORY: Social History   Socioeconomic History  . Marital status: Married    Spouse name: Not on file  . Number of children: Not on file  . Years of education: Not on file  . Highest education level: Not on file  Occupational History  . Not on file  Tobacco Use  . Smoking status: Never Smoker  . Smokeless tobacco: Never Used  Vaping Use  . Vaping  Use: Never used  Substance and Sexual Activity  . Alcohol use: Yes    Alcohol/week: 0.0 standard drinks    Comment: Rare  . Drug use: No  . Sexual activity: Not Currently   Other Topics Concern  . Not on file  Social History Narrative   No regular exercise      Drinks lots of [employer]epsi         Social Determinants of Health   Financial Resource Strain: Low Risk   . Difficulty of Paying Living Expenses: Not hard at all  Food Insecurity: No Food Insecurity  . Worried About Charity fundraiser in the Last Year: Never true  . Ran Out of Food in the Last Year: Never true  Transportation Needs: No Transportation Needs  . Lack of Transportation (Medical): No  . Lack of Transportation (Non-Medical): No  Physical Activity: Inactive  . Days of Exercise per Week: 0 days  . Minutes of Exercise per Session: 0 min  Stress: No Stress Concern Present  . Feeling of Stress : Not at all  Social Connections: Not on file  Intimate Partner Violence: Not At Risk  . Fear of Current or Ex-Partner: No  . Emotionally Abused: No  . Physically Abused: No  . Sexually Abused: No    FAMILY HISTORY: Family History  Problem Relation Age of Onset  . Osteoporosis Mother   . Stroke Mother   . Coronary artery disease Father   . Stroke Father 71  . Diabetes Other        Aunts and uncles  . Breast cancer Paternal Aunt   . Breast cancer Maternal Aunt   . Arthritis/Rheumatoid Child   . Breast cancer Cousin     ALLERGIES:  is allergic to codeine.  MEDICATIONS:  Current Outpatient Medications  Medication Sig Dispense Refill  . acetaminophen (TYLENOL) 500 MG tablet Take 500-1,000 mg by mouth every 6 (six) hours as needed for mild pain or headache.    Marland Kitchen aspirin EC 81 MG tablet Take 81 mg by mouth at bedtime. Swallow whole.     Marland Kitchen BIOTIN PO Take 1 tablet by mouth 3 (three) times a week.     . Calcium Carb-Cholecalciferol (CALCIUM 600+D3 PO) Take 1 tablet by mouth 3 (three) times a week.     . Darbepoetin Alfa (ARANESP) 500 MCG/ML SOSY injection Inject 500 mcg into the skin See admin instructions. Inject 500 mcg into the skin every week    . lenalidomide (REVLIMID) 10 MG capsule  Take 1 capsule (10 mg total) by mouth daily. 3 weeks-On and 1 week-OFF. 21 capsule 0  . levothyroxine (SYNTHROID) 25 MCG tablet Take 1 tablet (25 mcg total) by mouth daily before breakfast. 90 tablet 3  . ondansetron (ZOFRAN ODT) 8 MG disintegrating tablet Take 1 tablet (8 mg total) by mouth every 8 (eight) hours as needed for nausea or vomiting. 30 tablet 0   No current facility-administered medications for this visit.      PHYSICAL EXAMINATION:   Vitals:   09/10/20 1518  BP: (!) 115/58  Pulse: 64  Resp: 16  Temp: (!) 97.4 F (36.3 C)  SpO2: 99%   Filed Weights   09/10/20 1518  Weight: 150 lb (68 kg)    Physical Exam Constitutional:      Comments: Walk independently.  Accompanied by husband.  HENT:     Head: Normocephalic and atraumatic.     Mouth/Throat:     Pharynx: No oropharyngeal exudate.  Eyes:  Pupils: Pupils are equal, round, and reactive to light.  Cardiovascular:     Rate and Rhythm: Normal rate and regular rhythm.     Pulses: Normal pulses.     Heart sounds: Normal heart sounds.  Pulmonary:     Effort: Pulmonary effort is normal. No respiratory distress.     Breath sounds: Normal breath sounds. No wheezing.  Abdominal:     General: Bowel sounds are normal. There is no distension.     Palpations: Abdomen is soft. There is no mass.     Tenderness: There is no abdominal tenderness. There is no guarding or rebound.  Musculoskeletal:        General: No tenderness. Normal range of motion.     Cervical back: Normal range of motion and neck supple.  Skin:    General: Skin is warm.  Neurological:     Mental Status: She is alert and oriented to person, place, and time.  Psychiatric:        Mood and Affect: Affect normal.     LABORATORY DATA:  I have reviewed the data as listed Lab Results  Component Value Date   WBC 4.9 09/10/2020   HGB 10.2 (L) 09/10/2020   HCT 29.8 (L) 09/10/2020   MCV 105.7 (H) 09/10/2020   PLT 261 09/10/2020   Recent Labs     05/20/20 0839 05/22/20 0829 06/03/20 1351 06/17/20 1516 06/24/20 1504 07/01/20 1301 07/29/20 1030 08/12/20 1503 09/10/20 1501  NA 127*   < > 137 138 140   < > 137 139 139  K 3.8   < > 4.3 3.6 3.5   < > 4.1 3.9 3.8  CL 96*   < > 104 104 105   < > 104 103 103  CO2 21*   < > '26 27 27   ' < > '26 27 28  ' GLUCOSE 114*   < > 113* 134* 101*   < > 138* 117* 102*  BUN 29*   < > '15 13 8   ' < > '15 15 14  ' CREATININE 1.23*   < > 1.10* 0.95 1.08*   < > 1.07* 1.00 0.83  CALCIUM 8.2*   < > 8.2* 8.5* 8.6*   < > 9.3 9.2 8.9  GFRNONAA 45*   < > 51* >60 52*   < > 56* >60 >60  GFRAA 52*   < > 59* >60 >60  --   --   --   --   PROT 7.6  --  7.3 7.7 7.7   < > 8.0 7.9 7.9  ALBUMIN 3.3*  --  3.5 3.9 4.1   < > 4.3 4.2 4.2  AST 86*  --  '19 17 18   ' < > '18 22 22  ' ALT 57*  --  '20 15 15   ' < > '15 20 20  ' ALKPHOS 32*  --  43 51 50   < > 45 44 52  BILITOT 1.7*  --  1.6* 1.0 1.1   < > 1.1 1.2 0.8  BILIDIR 0.5*  --   --   --   --   --   --   --   --   IBILI 1.2*  --   --   --   --   --   --   --   --    < > = values in this interval not displayed.     No results found.  MDS (myelodysplastic syndrome), low grade (  [place]) # Low grade MDS- with ringed sideroblasts; no blasts. POSITIVE  For SF3B1; R-IPSS-low risk. Poor response to Aranesp; currently on Aranesp 500 mcg weekly subcu injections; and  lenalidomide 10 mg a day-tolerating fairly well.    # Hemoglobin is 10.2; HOLD Aranesp.  Monitor closely. Continue Revlimid 20m; days-1-21; 7 days OFF.  STABLE.   # Right LE pain/sciatica- continue NSAIDs/ flexril; STABLE.    # Diarrhea-G-1; sec to revlimid- monitor for now; STABLE.    #Recent exposure to Covid positive individual; further work-up including Covid test negative.  Patient is vaccinated.  Monitor closely.  # DISPOSITION:  # NO Aranesp # in 2 weeks- cbc/possible aranesp #  in 4 weeks- cbc/possible aranesp # follow up in 6 weeks  MD; labs- cbc/Cmp;LDH;possible; Aranesp SQ- Dr.B  All questions were  answered. The patient knows to call the clinic with any problems, questions or concerns.   GCammie Sickle MD 09/10/2020 3:44 PM

## 2020-09-12 ENCOUNTER — Telehealth: Payer: Self-pay | Admitting: *Deleted

## 2020-09-12 DIAGNOSIS — D4620 Refractory anemia with excess of blasts, unspecified: Secondary | ICD-10-CM

## 2020-09-12 MED ORDER — LENALIDOMIDE 10 MG PO CAPS: 10.0000 mg | ORAL_CAPSULE | Freq: Every day | ORAL | 0 refills | Status: DC

## 2020-09-12 NOTE — Telephone Encounter (Signed)
revlimid rF submitted to [facility].

## 2020-09-13 LAB — SAMPLE TO BLOOD BANK

## 2020-09-22 DIAGNOSIS — J180 Bronchopneumonia, unspecified organism: Secondary | ICD-10-CM | POA: Diagnosis not present

## 2020-09-24 ENCOUNTER — Inpatient Hospital Stay: Payer: PPO

## 2020-09-24 DIAGNOSIS — D4620 Refractory anemia with excess of blasts, unspecified: Secondary | ICD-10-CM

## 2020-09-24 DIAGNOSIS — I50811 Acute right heart failure: Secondary | ICD-10-CM

## 2020-09-24 LAB — COMPREHENSIVE METABOLIC PANEL
ALT: 19 U/L (ref 0–44)
AST: 21 U/L (ref 15–41)
Albumin: 4.1 g/dL (ref 3.5–5.0)
Alkaline Phosphatase: 50 U/L (ref 38–126)
Anion gap: 9 (ref 5–15)
BUN: 15 mg/dL (ref 8–23)
CO2: 25 mmol/L (ref 22–32)
Calcium: 8.7 mg/dL — ABNORMAL LOW (ref 8.9–10.3)
Chloride: 105 mmol/L (ref 98–111)
Creatinine, Ser: 0.92 mg/dL (ref 0.44–1.00)
GFR, Estimated: >60 mL/min (ref >60)
Glucose, Bld: 108 mg/dL — ABNORMAL HIGH (ref 70–99)
Potassium: 3.6 mmol/L (ref 3.5–5.1)
Sodium: 139 mmol/L (ref 135–145)
Total Bilirubin: 0.8 mg/dL (ref 0.3–1.2)
Total Protein: 8.1 g/dL (ref 6.5–8.1)

## 2020-09-24 LAB — CBC WITH DIFFERENTIAL/PLATELET
Abs Immature Granulocytes: 0.01 10*3/uL (ref 0.00–0.07)
Basophils Absolute: 0.1 10*3/uL (ref 0.0–0.1)
Basophils Relative: 2 %
Eosinophils Absolute: 0.1 10*3/uL (ref 0.0–0.5)
Eosinophils Relative: 3 %
HCT: 29.2 % — ABNORMAL LOW (ref 36.0–46.0)
Hemoglobin: 10.2 g/dL — ABNORMAL LOW (ref 12.0–15.0)
Immature Granulocytes: 0 %
Lymphocytes Relative: 48 %
Lymphs Abs: 2.3 10*3/uL (ref 0.7–4.0)
MCH: 36.4 pg — ABNORMAL HIGH (ref 26.0–34.0)
MCHC: 34.9 g/dL (ref 30.0–36.0)
MCV: 104.3 fL — ABNORMAL HIGH (ref 80.0–100.0)
Monocytes Absolute: 0.5 10*3/uL (ref 0.1–1.0)
Monocytes Relative: 11 %
Neutro Abs: 1.7 10*3/uL (ref 1.7–7.7)
Neutrophils Relative %: 36 %
Platelets: 172 10*3/uL (ref 150–400)
RBC: 2.8 MIL/uL — ABNORMAL LOW (ref 3.87–5.11)
RDW: 14.5 % (ref 11.5–15.5)
WBC: 4.7 10*3/uL (ref 4.0–10.5)
nRBC: 0 % (ref 0.0–0.2)

## 2020-09-24 LAB — SAMPLE TO BLOOD BANK

## 2020-10-08 ENCOUNTER — Inpatient Hospital Stay: Payer: PPO

## 2020-10-08 ENCOUNTER — Inpatient Hospital Stay: Payer: PPO | Attending: Internal Medicine

## 2020-10-08 DIAGNOSIS — D469 Myelodysplastic syndrome, unspecified: Secondary | ICD-10-CM | POA: Diagnosis not present

## 2020-10-08 DIAGNOSIS — D4620 Refractory anemia with excess of blasts, unspecified: Secondary | ICD-10-CM

## 2020-10-08 DIAGNOSIS — Z803 Family history of malignant neoplasm of breast: Secondary | ICD-10-CM | POA: Diagnosis not present

## 2020-10-08 DIAGNOSIS — Z7982 Long term (current) use of aspirin: Secondary | ICD-10-CM | POA: Insufficient documentation

## 2020-10-08 DIAGNOSIS — Z8261 Family history of arthritis: Secondary | ICD-10-CM | POA: Diagnosis not present

## 2020-10-08 DIAGNOSIS — R197 Diarrhea, unspecified: Secondary | ICD-10-CM | POA: Insufficient documentation

## 2020-10-08 DIAGNOSIS — Z79899 Other long term (current) drug therapy: Secondary | ICD-10-CM | POA: Insufficient documentation

## 2020-10-08 DIAGNOSIS — I50811 Acute right heart failure: Secondary | ICD-10-CM

## 2020-10-08 DIAGNOSIS — Z8249 Family history of ischemic heart disease and other diseases of the circulatory system: Secondary | ICD-10-CM | POA: Diagnosis not present

## 2020-10-08 DIAGNOSIS — D649 Anemia, unspecified: Secondary | ICD-10-CM

## 2020-10-08 LAB — CBC WITH DIFFERENTIAL/PLATELET
Abs Immature Granulocytes: 0.02 10*3/uL (ref 0.00–0.07)
Basophils Absolute: 0.1 10*3/uL (ref 0.0–0.1)
Basophils Relative: 2 %
Eosinophils Absolute: 0.5 10*3/uL (ref 0.0–0.5)
Eosinophils Relative: 7 %
HCT: 31.5 % — ABNORMAL LOW (ref 36.0–46.0)
Hemoglobin: 10.9 g/dL — ABNORMAL LOW (ref 12.0–15.0)
Immature Granulocytes: 0 %
Lymphocytes Relative: 30 %
Lymphs Abs: 1.9 10*3/uL (ref 0.7–4.0)
MCH: 35.7 pg — ABNORMAL HIGH (ref 26.0–34.0)
MCHC: 34.6 g/dL (ref 30.0–36.0)
MCV: 103.3 fL — ABNORMAL HIGH (ref 80.0–100.0)
Monocytes Absolute: 1 10*3/uL (ref 0.1–1.0)
Monocytes Relative: 16 %
Neutro Abs: 2.8 10*3/uL (ref 1.7–7.7)
Neutrophils Relative %: 45 %
Platelets: 256 10*3/uL (ref 150–400)
RBC: 3.05 MIL/uL — ABNORMAL LOW (ref 3.87–5.11)
RDW: 14 % (ref 11.5–15.5)
WBC: 6.3 10*3/uL (ref 4.0–10.5)
nRBC: 0 % (ref 0.0–0.2)

## 2020-10-08 LAB — COMPREHENSIVE METABOLIC PANEL
ALT: 23 U/L (ref 0–44)
AST: 23 U/L (ref 15–41)
Albumin: 4.3 g/dL (ref 3.5–5.0)
Alkaline Phosphatase: 52 U/L (ref 38–126)
Anion gap: 6 (ref 5–15)
BUN: 17 mg/dL (ref 8–23)
CO2: 27 mmol/L (ref 22–32)
Calcium: 9 mg/dL (ref 8.9–10.3)
Chloride: 102 mmol/L (ref 98–111)
Creatinine, Ser: 0.92 mg/dL (ref 0.44–1.00)
GFR, Estimated: >60 mL/min (ref >60)
Glucose, Bld: 115 mg/dL — ABNORMAL HIGH (ref 70–99)
Potassium: 3.8 mmol/L (ref 3.5–5.1)
Sodium: 135 mmol/L (ref 135–145)
Total Bilirubin: 0.7 mg/dL (ref 0.3–1.2)
Total Protein: 7.9 g/dL (ref 6.5–8.1)

## 2020-10-08 LAB — SAMPLE TO BLOOD BANK

## 2020-10-14 ENCOUNTER — Telehealth: Payer: Self-pay | Admitting: *Deleted

## 2020-10-14 DIAGNOSIS — D4620 Refractory anemia with excess of blasts, unspecified: Secondary | ICD-10-CM

## 2020-10-14 MED ORDER — LENALIDOMIDE 10 MG PO CAPS: 10.0000 mg | ORAL_CAPSULE | Freq: Every day | ORAL | 0 refills | Status: DC

## 2020-10-14 NOTE — Telephone Encounter (Signed)
RF for Revlimid submitted to [facility]. My chart message sent to patient to remind her to take her patient survey.

## 2020-10-15 DIAGNOSIS — L821 Other seborrheic keratosis: Secondary | ICD-10-CM | POA: Diagnosis not present

## 2020-10-15 DIAGNOSIS — D225 Melanocytic nevi of trunk: Secondary | ICD-10-CM | POA: Diagnosis not present

## 2020-10-15 DIAGNOSIS — D2272 Melanocytic nevi of left lower limb, including hip: Secondary | ICD-10-CM | POA: Diagnosis not present

## 2020-10-15 DIAGNOSIS — D2271 Melanocytic nevi of right lower limb, including hip: Secondary | ICD-10-CM | POA: Diagnosis not present

## 2020-10-15 DIAGNOSIS — D2261 Melanocytic nevi of right upper limb, including shoulder: Secondary | ICD-10-CM | POA: Diagnosis not present

## 2020-10-15 DIAGNOSIS — L718 Other rosacea: Secondary | ICD-10-CM | POA: Diagnosis not present

## 2020-10-15 DIAGNOSIS — D2262 Melanocytic nevi of left upper limb, including shoulder: Secondary | ICD-10-CM | POA: Diagnosis not present

## 2020-10-23 DIAGNOSIS — J180 Bronchopneumonia, unspecified organism: Secondary | ICD-10-CM | POA: Diagnosis not present

## 2020-10-24 ENCOUNTER — Inpatient Hospital Stay: Payer: PPO

## 2020-10-24 ENCOUNTER — Inpatient Hospital Stay (HOSPITAL_BASED_OUTPATIENT_CLINIC_OR_DEPARTMENT_OTHER): Payer: PPO | Admitting: Internal Medicine

## 2020-10-24 DIAGNOSIS — I50811 Acute right heart failure: Secondary | ICD-10-CM

## 2020-10-24 DIAGNOSIS — D4620 Refractory anemia with excess of blasts, unspecified: Secondary | ICD-10-CM | POA: Diagnosis not present

## 2020-10-24 DIAGNOSIS — D469 Myelodysplastic syndrome, unspecified: Secondary | ICD-10-CM | POA: Diagnosis not present

## 2020-10-24 DIAGNOSIS — D649 Anemia, unspecified: Secondary | ICD-10-CM

## 2020-10-24 LAB — CBC WITH DIFFERENTIAL/PLATELET
Abs Immature Granulocytes: 0.01 10*3/uL (ref 0.00–0.07)
Basophils Absolute: 0.1 10*3/uL (ref 0.0–0.1)
Basophils Relative: 2 %
Eosinophils Absolute: 0.2 10*3/uL (ref 0.0–0.5)
Eosinophils Relative: 4 %
HCT: 29.6 % — ABNORMAL LOW (ref 36.0–46.0)
Hemoglobin: 10.3 g/dL — ABNORMAL LOW (ref 12.0–15.0)
Immature Granulocytes: 0 %
Lymphocytes Relative: 49 %
Lymphs Abs: 2.4 10*3/uL (ref 0.7–4.0)
MCH: 35.8 pg — ABNORMAL HIGH (ref 26.0–34.0)
MCHC: 34.8 g/dL (ref 30.0–36.0)
MCV: 102.8 fL — ABNORMAL HIGH (ref 80.0–100.0)
Monocytes Absolute: 0.6 10*3/uL (ref 0.1–1.0)
Monocytes Relative: 12 %
Neutro Abs: 1.6 10*3/uL — ABNORMAL LOW (ref 1.7–7.7)
Neutrophils Relative %: 33 %
Platelets: 140 10*3/uL — ABNORMAL LOW (ref 150–400)
RBC: 2.88 MIL/uL — ABNORMAL LOW (ref 3.87–5.11)
RDW: 14.3 % (ref 11.5–15.5)
WBC: 4.8 10*3/uL (ref 4.0–10.5)
nRBC: 0 % (ref 0.0–0.2)

## 2020-10-24 LAB — COMPREHENSIVE METABOLIC PANEL
ALT: 20 U/L (ref 0–44)
AST: 22 U/L (ref 15–41)
Albumin: 4.2 g/dL (ref 3.5–5.0)
Alkaline Phosphatase: 59 U/L (ref 38–126)
Anion gap: 9 (ref 5–15)
BUN: 18 mg/dL (ref 8–23)
CO2: 26 mmol/L (ref 22–32)
Calcium: 9.1 mg/dL (ref 8.9–10.3)
Chloride: 102 mmol/L (ref 98–111)
Creatinine, Ser: 1.04 mg/dL — ABNORMAL HIGH (ref 0.44–1.00)
GFR, Estimated: 58 mL/min — ABNORMAL LOW (ref >60)
Glucose, Bld: 126 mg/dL — ABNORMAL HIGH (ref 70–99)
Potassium: 4 mmol/L (ref 3.5–5.1)
Sodium: 137 mmol/L (ref 135–145)
Total Bilirubin: 0.7 mg/dL (ref 0.3–1.2)
Total Protein: 8.3 g/dL — ABNORMAL HIGH (ref 6.5–8.1)

## 2020-10-24 NOTE — Progress Notes (Signed)
Has been having some issues with stomach. States she has had diarrhea occasionally lately and wonders if it is from the medication.

## 2020-10-24 NOTE — Assessment & Plan Note (Addendum)
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 Poor response to Aranesp; currently on Aranesp 500 mcg weekly subcu injections; and  lenalidomide 10 mg a day-tolerating fairly well.    # Hemoglobin is 10.3; HOLD Aranesp.  Monitor closely. Continue Revlimid 86m; days-1-21; 7 days OFF.  STABLE.    # Diarrhea-G-1; sec to revlimid- monitor for now- STABLE  # DISPOSITION:  # NO Aranesp # in 2 weeks- cbc/possible aranesp #  in 4 weeks- cbc/possible aranesp # follow up in 6 weeks  MD; labs- cbc/Cmp;LDH;iron studies/ferritin- possible; Aranesp SQ- Dr.B

## 2020-10-24 NOTE — Progress Notes (Signed)
[facility] CONSULT NOTE  Patient Care Team: [name], [name], MD as PCP - General Rogue Bussing, [name], MD as Consulting Physician (Hematology and Oncology)  CHIEF COMPLAINTS/PURPOSE OF CONSULTATION: MDS   Oncology History Overview Note   # July 2020-myelodysplastic syndrome with ringed sideroblasts-Normal karyotype; no blasts [IPSS R-Very low risk ~median survival 8.3 years]; iron studies O03 folic acid myeloma panel normal; pyridoxine levels/copper/zinc-WNL. Erythropoietin levels-60. 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  female history of low-grade MDS anemia with ring sideroblasts currently on Aranesp/Revlimid is here for follow-up.  Patient denies any nausea vomiting abdominal pain.  Chronic mild diarrhea.  No skin rash. Review of Systems  Constitutional: Positive for malaise/fatigue. Negative for chills, diaphoresis and fever.  HENT: Negative for nosebleeds and sore throat.   Eyes: Negative for double vision.  Respiratory: Negative for cough, hemoptysis, sputum production, shortness of breath and wheezing.   Cardiovascular: Negative for chest pain and orthopnea.  Gastrointestinal: Negative for abdominal pain, blood in stool,  constipation, diarrhea, heartburn, melena and vomiting.  Genitourinary: Negative for dysuria, frequency and urgency.  Musculoskeletal: Positive for back pain and joint pain.  Skin: Negative.  Negative for itching and rash.  Neurological: Negative for dizziness, tingling, focal weakness, weakness and headaches.  Endo/Heme/Allergies: Does not bruise/bleed easily.  Psychiatric/Behavioral: Negative for depression. The patient is not nervous/anxious and does not have insomnia.     MEDICAL HISTORY:  Past Medical History:  Diagnosis Date  . Allergic rhinitis, cause unspecified   . Anemia, unspecified   . Carpal tunnel syndrome   . Cystitis, unspecified   . Family history of osteoporosis   . PONV (postoperative nausea and vomiting)   . Postnasal drip   . Syncope and collapse     SURGICAL HISTORY: Past Surgical History:  Procedure Laterality Date  . CARPAL TUNNEL RELEASE    . COLONOSCOPY  2005  . COLONOSCOPY WITH PROPOFOL N/A 07/31/2015   Procedure: COLONOSCOPY WITH PROPOFOL;  Surgeon: RRonald Lobo MD;  Location: [facility];  Service: Endoscopy;  Laterality: N/A;  . DIAGNOSTIC LAPAROSCOPY     dx lack of pregnancy   . EYE SURGERY     lasik, cataract, prk,yag procedure    SOCIAL HISTORY: Social History   Socioeconomic History  . Marital status: Married    Spouse name: Not on file  . Number of children: Not on file  . Years of education: Not on file  . Highest education level: Not on file  Occupational History  . Not on file  Tobacco Use  . Smoking status: Never Smoker  . Smokeless tobacco: Never Used  Vaping Use  . Vaping Use: Never used  Substance and Sexual Activity  . Alcohol use: Yes    Alcohol/week: 0.0 standard drinks    Comment: Rare  . Drug use: No  . Sexual activity:  Not Currently  Other Topics Concern  . Not on file  Social History Narrative   No regular exercise      Drinks lots of [employer]epsi         Social Determinants of Health   Financial Resource  Strain: Low Risk   . Difficulty of Paying Living Expenses: Not hard at all  Food Insecurity: No Food Insecurity  . Worried About Charity fundraiser in the Last Year: Never true  . Ran Out of Food in the Last Year: Never true  Transportation Needs: No Transportation Needs  . Lack of Transportation (Medical): No  . Lack of Transportation (Non-Medical): No  Physical Activity: Inactive  . Days of Exercise per Week: 0 days  . Minutes of Exercise per Session: 0 min  Stress: No Stress Concern Present  . Feeling of Stress : Not at all  Social Connections: Not on file  Intimate Partner Violence: Not At Risk  . Fear of Current or Ex-Partner: No  . Emotionally Abused: No  . Physically Abused: No  . Sexually Abused: No    FAMILY HISTORY: Family History  Problem Relation Age of Onset  . Osteoporosis Mother   . Stroke Mother   . Coronary artery disease Father   . Stroke Father 46  . Diabetes Other        Aunts and uncles  . Breast cancer Paternal Aunt   . Breast cancer Maternal Aunt   . Arthritis/Rheumatoid Child   . Breast cancer Cousin     ALLERGIES:  is allergic to codeine.  MEDICATIONS:  Current Outpatient Medications  Medication Sig Dispense Refill  . acetaminophen (TYLENOL) 500 MG tablet Take 500-1,000 mg by mouth every 6 (six) hours as needed for mild pain or headache.    Marland Kitchen aspirin EC 81 MG tablet Take 81 mg by mouth at bedtime. Swallow whole.     Marland Kitchen BIOTIN PO Take 1 tablet by mouth 3 (three) times a week.     . Calcium Carb-Cholecalciferol (CALCIUM 600+D3 PO) Take 1 tablet by mouth 3 (three) times a week.     . Darbepoetin Alfa (ARANESP) 500 MCG/ML SOSY injection Inject 500 mcg into the skin See admin instructions. Inject 500 mcg into the skin every week    . lenalidomide (REVLIMID) 10 MG capsule Take 1 capsule (10 mg total) by mouth daily. 3 weeks-On and 1 week-OFF. 21 capsule 0  . levothyroxine (SYNTHROID) 25 MCG tablet Take 1 tablet (25 mcg total) by mouth daily before  breakfast. 90 tablet 3  . ondansetron (ZOFRAN ODT) 8 MG disintegrating tablet Take 1 tablet (8 mg total) by mouth every 8 (eight) hours as needed for nausea or vomiting. 30 tablet 0   No current facility-administered medications for this visit.      PHYSICAL EXAMINATION:   Vitals:   10/24/20 1401  BP: 119/63  Pulse: 67  Temp: (!) 96.7 F (35.9 C)  SpO2: 99%   Filed Weights   10/24/20 1401  Weight: 151 lb 6.4 oz (68.7 kg)    Physical Exam Constitutional:      Comments: Walk independently.  Accompanied by husband.  HENT:     Head: Normocephalic and atraumatic.     Mouth/Throat:     Pharynx: No oropharyngeal exudate.  Eyes:     Pupils: Pupils are equal, round, and reactive to light.  Cardiovascular:     Rate and Rhythm: Normal rate and regular rhythm.     Pulses: Normal pulses.  Heart sounds: Normal heart sounds.  Pulmonary:     Effort: Pulmonary effort is normal. No respiratory distress.     Breath sounds: Normal breath sounds. No wheezing.  Abdominal:     General: Bowel sounds are normal. There is no distension.     Palpations: Abdomen is soft. There is no mass.     Tenderness: There is no abdominal tenderness. There is no guarding or rebound.  Musculoskeletal:        General: No tenderness. Normal range of motion.     Cervical back: Normal range of motion and neck supple.  Skin:    General: Skin is warm.  Neurological:     Mental Status: Carla [name] is alert and oriented to person, place, and time.  Psychiatric:        Mood and Affect: Affect normal.     LABORATORY DATA:  I have reviewed the data as listed Lab Results  Component Value Date   WBC 4.8 10/24/2020   HGB 10.3 (L) 10/24/2020   HCT 29.6 (L) 10/24/2020   MCV 102.8 (H) 10/24/2020   PLT 140 (L) 10/24/2020   Recent Labs    05/20/20 0839 05/22/20 0829 06/03/20 1351 06/17/20 1516 06/24/20 1504 07/01/20 1301 09/24/20 1431 10/08/20 1042 10/24/20 1343  NA 127*   < > 137 138 140   < > 139 135  137  K 3.8   < > 4.3 3.6 3.5   < > 3.6 3.8 4.0  CL 96*   < > 104 104 105   < > 105 102 102  CO2 21*   < > '26 27 27   ' < > '25 27 26  ' GLUCOSE 114*   < > 113* 134* 101*   < > 108* 115* 126*  BUN 29*   < > '15 13 8   ' < > '15 17 18  ' CREATININE 1.23*   < > 1.10* 0.95 1.08*   < > 0.92 0.92 1.04*  CALCIUM 8.2*   < > 8.2* 8.5* 8.6*   < > 8.7* 9.0 9.1  GFRNONAA 45*   < > 51* >60 52*   < > >60 >60 58*  GFRAA 52*   < > 59* >60 >60  --   --   --   --   PROT 7.6  --  7.3 7.7 7.7   < > 8.1 7.9 8.3*  ALBUMIN 3.3*  --  3.5 3.9 4.1   < > 4.1 4.3 4.2  AST 86*  --  '19 17 18   ' < > '21 23 22  ' ALT 57*  --  '20 15 15   ' < > '19 23 20  ' ALKPHOS 32*  --  43 51 50   < > 50 52 59  BILITOT 1.7*  --  1.6* 1.0 1.1   < > 0.8 0.7 0.7  BILIDIR 0.5*  --   --   --   --   --   --   --   --   IBILI 1.2*  --   --   --   --   --   --   --   --    < > = values in this interval not displayed.     No results found.  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 POSITIVE  For SF3B1; R-IPSS-low risk. Poor response to Aranesp; currently on Aranesp 500 mcg weekly subcu injections; and  lenalidomide 10 mg  a day-tolerating fairly well.    # Hemoglobin is 10.3; HOLD Aranesp.  Monitor closely. Continue Revlimid 68m; days-1-21; 7 days OFF.  STABLE.    # Diarrhea-G-1; sec to revlimid- monitor for now- STABLE  # DISPOSITION:  # NO Aranesp # in 2 weeks- cbc/possible aranesp #  in 4 weeks- cbc/possible aranesp # follow up in 6 weeks  MD; labs- cbc/Cmp;LDH;iron studies/ferritin- possible; Aranesp SQ- Dr.B  All questions were answered. The patient knows to call the clinic with any problems, questions or concerns.   GCammie Sickle MD 10/27/2020 8:52 AM

## 2020-11-07 ENCOUNTER — Inpatient Hospital Stay: Payer: PPO

## 2020-11-07 ENCOUNTER — Inpatient Hospital Stay: Payer: PPO | Attending: Internal Medicine

## 2020-11-07 DIAGNOSIS — D4620 Refractory anemia with excess of blasts, unspecified: Secondary | ICD-10-CM | POA: Diagnosis not present

## 2020-11-07 DIAGNOSIS — I50811 Acute right heart failure: Secondary | ICD-10-CM

## 2020-11-07 LAB — CBC WITH DIFFERENTIAL/PLATELET
Abs Immature Granulocytes: 0.02 10*3/uL (ref 0.00–0.07)
Basophils Absolute: 0.1 10*3/uL (ref 0.0–0.1)
Basophils Relative: 2 %
Eosinophils Absolute: 0.4 10*3/uL (ref 0.0–0.5)
Eosinophils Relative: 9 %
HCT: 29.1 % — ABNORMAL LOW (ref 36.0–46.0)
Hemoglobin: 10.2 g/dL — ABNORMAL LOW (ref 12.0–15.0)
Immature Granulocytes: 1 %
Lymphocytes Relative: 34 %
Lymphs Abs: 1.5 10*3/uL (ref 0.7–4.0)
MCH: 36 pg — ABNORMAL HIGH (ref 26.0–34.0)
MCHC: 35.1 g/dL (ref 30.0–36.0)
MCV: 102.8 fL — ABNORMAL HIGH (ref 80.0–100.0)
Monocytes Absolute: 0.6 10*3/uL (ref 0.1–1.0)
Monocytes Relative: 13 %
Neutro Abs: 1.8 10*3/uL (ref 1.7–7.7)
Neutrophils Relative %: 41 %
Platelets: 188 10*3/uL (ref 150–400)
RBC: 2.83 MIL/uL — ABNORMAL LOW (ref 3.87–5.11)
RDW: 14.5 % (ref 11.5–15.5)
WBC: 4.3 10*3/uL (ref 4.0–10.5)
nRBC: 0 % (ref 0.0–0.2)

## 2020-11-07 LAB — COMPREHENSIVE METABOLIC PANEL
ALT: 19 U/L (ref 0–44)
AST: 24 U/L (ref 15–41)
Albumin: 4.1 g/dL (ref 3.5–5.0)
Alkaline Phosphatase: 51 U/L (ref 38–126)
Anion gap: 10 (ref 5–15)
BUN: 14 mg/dL (ref 8–23)
CO2: 25 mmol/L (ref 22–32)
Calcium: 9 mg/dL (ref 8.9–10.3)
Chloride: 103 mmol/L (ref 98–111)
Creatinine, Ser: 0.91 mg/dL (ref 0.44–1.00)
GFR, Estimated: >60 mL/min (ref >60)
Glucose, Bld: 135 mg/dL — ABNORMAL HIGH (ref 70–99)
Potassium: 3.9 mmol/L (ref 3.5–5.1)
Sodium: 138 mmol/L (ref 135–145)
Total Bilirubin: 1.1 mg/dL (ref 0.3–1.2)
Total Protein: 7.9 g/dL (ref 6.5–8.1)

## 2020-11-13 ENCOUNTER — Other Ambulatory Visit: Payer: Self-pay | Admitting: *Deleted

## 2020-11-13 DIAGNOSIS — D4620 Refractory anemia with excess of blasts, unspecified: Secondary | ICD-10-CM

## 2020-11-13 MED ORDER — LENALIDOMIDE 10 MG PO CAPS: 10.0000 mg | ORAL_CAPSULE | Freq: Every day | ORAL | 0 refills | Status: DC

## 2020-11-13 NOTE — Telephone Encounter (Signed)
revlimid prescription renewed [id number] auth for revlimid

## 2020-11-21 ENCOUNTER — Inpatient Hospital Stay: Payer: PPO

## 2020-11-21 DIAGNOSIS — D46Z Other myelodysplastic syndromes: Secondary | ICD-10-CM

## 2020-11-21 DIAGNOSIS — D4620 Refractory anemia with excess of blasts, unspecified: Secondary | ICD-10-CM

## 2020-11-21 DIAGNOSIS — I50811 Acute right heart failure: Secondary | ICD-10-CM

## 2020-11-21 LAB — CBC WITH DIFFERENTIAL/PLATELET
Abs Immature Granulocytes: 0 10*3/uL (ref 0.00–0.07)
Basophils Absolute: 0.1 10*3/uL (ref 0.0–0.1)
Basophils Relative: 2 %
Eosinophils Absolute: 0.2 10*3/uL (ref 0.0–0.5)
Eosinophils Relative: 6 %
HCT: 30.6 % — ABNORMAL LOW (ref 36.0–46.0)
Hemoglobin: 10.3 g/dL — ABNORMAL LOW (ref 12.0–15.0)
Immature Granulocytes: 0 %
Lymphocytes Relative: 45 %
Lymphs Abs: 1.7 10*3/uL (ref 0.7–4.0)
MCH: 36.3 pg — ABNORMAL HIGH (ref 26.0–34.0)
MCHC: 33.7 g/dL (ref 30.0–36.0)
MCV: 107.7 fL — ABNORMAL HIGH (ref 80.0–100.0)
Monocytes Absolute: 0.5 10*3/uL (ref 0.1–1.0)
Monocytes Relative: 12 %
Neutro Abs: 1.3 10*3/uL — ABNORMAL LOW (ref 1.7–7.7)
Neutrophils Relative %: 35 %
Platelets: 170 10*3/uL (ref 150–400)
RBC: 2.84 MIL/uL — ABNORMAL LOW (ref 3.87–5.11)
RDW: 15.6 % — ABNORMAL HIGH (ref 11.5–15.5)
WBC: 3.8 10*3/uL — ABNORMAL LOW (ref 4.0–10.5)
nRBC: 0 % (ref 0.0–0.2)

## 2020-11-21 LAB — COMPREHENSIVE METABOLIC PANEL
ALT: 120 U/L — ABNORMAL HIGH (ref 0–44)
AST: 48 U/L — ABNORMAL HIGH (ref 15–41)
Albumin: 4.2 g/dL (ref 3.5–5.0)
Alkaline Phosphatase: 76 U/L (ref 38–126)
Anion gap: 9 (ref 5–15)
BUN: 16 mg/dL (ref 8–23)
CO2: 24 mmol/L (ref 22–32)
Calcium: 8.9 mg/dL (ref 8.9–10.3)
Chloride: 105 mmol/L (ref 98–111)
Creatinine, Ser: 1.01 mg/dL — ABNORMAL HIGH (ref 0.44–1.00)
GFR, Estimated: 60 mL/min — ABNORMAL LOW (ref >60)
Glucose, Bld: 137 mg/dL — ABNORMAL HIGH (ref 70–99)
Potassium: 3.9 mmol/L (ref 3.5–5.1)
Sodium: 138 mmol/L (ref 135–145)
Total Bilirubin: 1.1 mg/dL (ref 0.3–1.2)
Total Protein: 8.4 g/dL — ABNORMAL HIGH (ref 6.5–8.1)

## 2020-11-23 DIAGNOSIS — J180 Bronchopneumonia, unspecified organism: Secondary | ICD-10-CM | POA: Diagnosis not present

## 2020-11-28 ENCOUNTER — Telehealth: Payer: Self-pay | Admitting: Internal Medicine

## 2020-11-28 DIAGNOSIS — D4620 Refractory anemia with excess of blasts, unspecified: Secondary | ICD-10-CM

## 2020-11-28 NOTE — Telephone Encounter (Signed)
Lab orders entered. New apt for 3/9 at 1115 am for lab only.

## 2020-11-28 NOTE — Telephone Encounter (Signed)
On 3/3-I spoke to patient's dentist Dr.M-concerned about mild neutropenia ANC-1.3 on 2/25 -likely secondary to Revlimid.  I think it is reasonable to hold the Revlimid until next visit on 3/11 in preparation of dental cleaning.  H/T-please inform patient to hold Revlimid until next visit. Inform below.  C- schedule lab on 3/09-Check CBC/CMP;LDH; Pt/PTT cancel labs on 3/11.   GB

## 2020-11-28 NOTE — Addendum Note (Signed)
Addended by: Gloris Ham on: 11/28/2020 09:13 AM   Modules accepted: Orders

## 2020-12-03 ENCOUNTER — Other Ambulatory Visit: Payer: Self-pay

## 2020-12-03 ENCOUNTER — Inpatient Hospital Stay: Payer: PPO | Attending: Internal Medicine

## 2020-12-03 DIAGNOSIS — Z79899 Other long term (current) drug therapy: Secondary | ICD-10-CM | POA: Diagnosis not present

## 2020-12-03 DIAGNOSIS — E039 Hypothyroidism, unspecified: Secondary | ICD-10-CM | POA: Insufficient documentation

## 2020-12-03 DIAGNOSIS — D461 Refractory anemia with ring sideroblasts: Secondary | ICD-10-CM | POA: Diagnosis not present

## 2020-12-03 DIAGNOSIS — D4620 Refractory anemia with excess of blasts, unspecified: Secondary | ICD-10-CM

## 2020-12-03 DIAGNOSIS — Z7982 Long term (current) use of aspirin: Secondary | ICD-10-CM | POA: Insufficient documentation

## 2020-12-03 LAB — COMPREHENSIVE METABOLIC PANEL
ALT: 26 U/L (ref 0–44)
AST: 27 U/L (ref 15–41)
Albumin: 4.1 g/dL (ref 3.5–5.0)
Alkaline Phosphatase: 55 U/L (ref 38–126)
Anion gap: 10 (ref 5–15)
BUN: 17 mg/dL (ref 8–23)
CO2: 25 mmol/L (ref 22–32)
Calcium: 9 mg/dL (ref 8.9–10.3)
Chloride: 104 mmol/L (ref 98–111)
Creatinine, Ser: 0.96 mg/dL (ref 0.44–1.00)
GFR, Estimated: >60 mL/min (ref >60)
Glucose, Bld: 126 mg/dL — ABNORMAL HIGH (ref 70–99)
Potassium: 3.8 mmol/L (ref 3.5–5.1)
Sodium: 139 mmol/L (ref 135–145)
Total Bilirubin: 1 mg/dL (ref 0.3–1.2)
Total Protein: 8.2 g/dL — ABNORMAL HIGH (ref 6.5–8.1)

## 2020-12-03 LAB — CBC WITH DIFFERENTIAL/PLATELET
Basophils Absolute: 0.1 10*3/uL (ref 0.0–0.1)
Basophils Relative: 3 %
Eosinophils Absolute: 0.2 10*3/uL (ref 0.0–0.5)
Eosinophils Relative: 5 %
HCT: 30 % — ABNORMAL LOW (ref 36.0–46.0)
Hemoglobin: 10.3 g/dL — ABNORMAL LOW (ref 12.0–15.0)
Lymphocytes Relative: 43 %
Lymphs Abs: 1.9 10*3/uL (ref 0.7–4.0)
MCH: 36.3 pg — ABNORMAL HIGH (ref 26.0–34.0)
MCHC: 34.3 g/dL (ref 30.0–36.0)
MCV: 105.6 fL — ABNORMAL HIGH (ref 80.0–100.0)
Monocytes Absolute: 0.4 10*3/uL (ref 0.1–1.0)
Monocytes Relative: 9 %
Neutro Abs: 1.8 10*3/uL (ref 1.7–7.7)
Neutrophils Relative %: 40 %
Platelets: 262 10*3/uL (ref 150–400)
RBC: 2.84 MIL/uL — ABNORMAL LOW (ref 3.87–5.11)
RDW: 15 % (ref 11.5–15.5)
WBC: 4.4 10*3/uL (ref 4.0–10.5)

## 2020-12-03 LAB — APTT: aPTT: 30 seconds (ref 24–36)

## 2020-12-03 LAB — IRON AND TIBC
Iron: 146 ug/dL (ref 28–170)
Saturation Ratios: 51 % — ABNORMAL HIGH (ref 10.4–31.8)
TIBC: 286 ug/dL (ref 250–450)
UIBC: 140 ug/dL

## 2020-12-03 LAB — LACTATE DEHYDROGENASE: LDH: 100 U/L (ref 98–192)

## 2020-12-03 LAB — PROTIME-INR
INR: 1.1 (ref 0.8–1.2)
Prothrombin Time: 14.2 seconds (ref 11.4–15.2)

## 2020-12-03 LAB — FERRITIN: Ferritin: 463 ng/mL — ABNORMAL HIGH (ref 11–307)

## 2020-12-04 ENCOUNTER — Other Ambulatory Visit: Payer: Self-pay

## 2020-12-04 DIAGNOSIS — I50811 Acute right heart failure: Secondary | ICD-10-CM

## 2020-12-04 DIAGNOSIS — D4620 Refractory anemia with excess of blasts, unspecified: Secondary | ICD-10-CM

## 2020-12-05 ENCOUNTER — Other Ambulatory Visit: Payer: PPO

## 2020-12-05 ENCOUNTER — Inpatient Hospital Stay: Payer: PPO

## 2020-12-05 ENCOUNTER — Inpatient Hospital Stay (HOSPITAL_BASED_OUTPATIENT_CLINIC_OR_DEPARTMENT_OTHER): Payer: PPO | Admitting: Internal Medicine

## 2020-12-05 ENCOUNTER — Encounter: Payer: Self-pay | Admitting: Internal Medicine

## 2020-12-05 ENCOUNTER — Other Ambulatory Visit: Payer: Self-pay

## 2020-12-05 DIAGNOSIS — D4620 Refractory anemia with excess of blasts, unspecified: Secondary | ICD-10-CM

## 2020-12-05 DIAGNOSIS — D461 Refractory anemia with ring sideroblasts: Secondary | ICD-10-CM | POA: Diagnosis not present

## 2020-12-05 NOTE — Assessment & Plan Note (Addendum)
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 Poor response to Aranesp; currently on Aranesp 500 mcg weekly subcu injections; and  lenalidomide 10 mg  3w-ON & 1 w-OFF-tolerating fairly well.    # Hemoglobin is 10.2; HOLD Aranesp.  Monitor closely. Continue Revlimid 50m; days-1-21; 7 days OFF. STABLE.   # Diarrhea-G-1; sec to revlimid- monitor for now- STABLE.   # DISPOSITION:  # NO Aranesp # in 3 weeks- cbc/possible aranesp #  in 6 weeks- cbc/possible aranesp # follow up in 9 weeks  MD; labs- cbc/Cmp;LDH;iron studies/ferritin- possible; Aranesp SQ- Dr.B

## 2020-12-08 NOTE — Progress Notes (Signed)
[facility] CONSULT NOTE  Patient Care Team: [name], [name], MD as PCP - General Rogue Bussing, [name], MD as Consulting Physician (Hematology and Oncology)  CHIEF COMPLAINTS/PURPOSE OF CONSULTATION: MDS   Oncology History Overview Note   # July 2020-myelodysplastic syndrome with ringed sideroblasts-Normal karyotype; no blasts [IPSS R-Very low risk ~median survival 8.3 years]; iron studies T03 folic acid myeloma panel normal; pyridoxine levels/copper/zinc-WNL. Erythropoietin levels-60. 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  female history of low-grade MDS anemia with ring sideroblasts currently on Aranesp/Revlimid is here for follow-up.  The interim patient underwent dental cleaning.  Patient has her Revlimid 1 week prior to dental cleaning.  Also discussed with patient's dentist.  ANC-improved greater than 1.7 appropriately.  No postprocedural complications.  No fever no chills.  No nausea vomiting.  Review of Systems  Constitutional: Positive for malaise/fatigue. Negative for chills, diaphoresis and fever.  HENT: Negative for nosebleeds and sore throat.   Eyes: Negative for double vision.  Respiratory: Negative for cough, hemoptysis,  sputum production, shortness of breath and wheezing.   Cardiovascular: Negative for chest pain and orthopnea.  Gastrointestinal: Negative for abdominal pain, blood in stool, constipation, diarrhea, heartburn, melena and vomiting.  Genitourinary: Negative for dysuria, frequency and urgency.  Musculoskeletal: Positive for back pain and joint pain.  Skin: Negative.  Negative for itching and rash.  Neurological: Negative for dizziness, tingling, focal weakness, weakness and headaches.  Endo/Heme/Allergies: Does not bruise/bleed easily.  Psychiatric/Behavioral: Negative for depression. The patient is not nervous/anxious and does not have insomnia.     MEDICAL HISTORY:  Past Medical History:  Diagnosis Date  . Allergic rhinitis, cause unspecified   . Anemia, unspecified   . Carpal tunnel syndrome   . Cystitis, unspecified   . Family history of osteoporosis   . PONV (postoperative nausea and vomiting)   . Postnasal drip   . Syncope and collapse     SURGICAL HISTORY: Past Surgical History:  Procedure Laterality Date  . CARPAL TUNNEL RELEASE    . COLONOSCOPY  2005  . COLONOSCOPY WITH PROPOFOL N/A 07/31/2015   Procedure: COLONOSCOPY WITH PROPOFOL;  Surgeon: RRonald Lobo MD;  Location: [facility];  Service: Endoscopy;  Laterality: N/A;  . DIAGNOSTIC LAPAROSCOPY     dx lack of pregnancy   . EYE SURGERY     lasik, cataract, prk,yag procedure    SOCIAL HISTORY: Social History   Socioeconomic History  . Marital status: Married    Spouse name: Not on file  . Number of children: Not on file  . Years of education: Not on file  . Highest education level: Not on file  Occupational History  . Not on file  Tobacco Use  . Smoking status: Never Smoker  . Smokeless tobacco: Never Used  Vaping Use  . Vaping Use: Never used  Substance and  Sexual Activity  . Alcohol use: Yes    Alcohol/week: 0.0 standard drinks    Comment: Rare  . Drug use: No  . Sexual activity: Not Currently   Other Topics Concern  . Not on file  Social History Narrative   No regular exercise      Drinks lots of [employer]         Social Determinants of Health   Financial Resource Strain: Low Risk   . Difficulty of Paying Living Expenses: Not hard at all  Food Insecurity: No Food Insecurity  . Worried About Charity fundraiser in the Last Year: Never true  . Ran Out of Food in the Last Year: Never true  Transportation Needs: No Transportation Needs  . Lack of Transportation (Medical): No  . Lack of Transportation (Non-Medical): No  Physical Activity: Inactive  . Days of Exercise per Week: 0 days  . Minutes of Exercise per Session: 0 min  Stress: No Stress Concern Present  . Feeling of Stress : Not at all  Social Connections: Not on file  Intimate Partner Violence: Not At Risk  . Fear of Current or Ex-Partner: No  . Emotionally Abused: No  . Physically Abused: No  . Sexually Abused: No    FAMILY HISTORY: Family History  Problem Relation Age of Onset  . Osteoporosis Mother   . Stroke Mother   . Coronary artery disease Father   . Stroke Father 53  . Diabetes Other        Aunts and uncles  . Breast cancer Paternal Aunt   . Breast cancer Maternal Aunt   . Arthritis/Rheumatoid Child   . Breast cancer Cousin     ALLERGIES:  is allergic to codeine.  MEDICATIONS:  Current Outpatient Medications  Medication Sig Dispense Refill  . acetaminophen (TYLENOL) 500 MG tablet Take 500-1,000 mg by mouth every 6 (six) hours as needed for mild pain or headache.    Marland Kitchen aspirin EC 81 MG tablet Take 81 mg by mouth at bedtime. Swallow whole.     Marland Kitchen BIOTIN PO Take 1 tablet by mouth 3 (three) times a week.     . Calcium Carb-Cholecalciferol (CALCIUM 600+D3 PO) Take 1 tablet by mouth 3 (three) times a week.     . Darbepoetin Alfa (ARANESP) 500 MCG/ML SOSY injection Inject 500 mcg into the skin See admin instructions. Inject 500 mcg into the skin every week    . lenalidomide (REVLIMID) 10 MG capsule  Take 1 capsule (10 mg total) by mouth daily. 3 weeks-On and 1 week-OFF. 21 capsule 0  . levothyroxine (SYNTHROID) 25 MCG tablet Take 1 tablet (25 mcg total) by mouth daily before breakfast. 90 tablet 3  . ondansetron (ZOFRAN ODT) 8 MG disintegrating tablet Take 1 tablet (8 mg total) by mouth every 8 (eight) hours as needed for nausea or vomiting. 30 tablet 0   No current facility-administered medications for this visit.      PHYSICAL EXAMINATION:   Vitals:   12/05/20 1436  BP: 116/78  Pulse: 75  SpO2: 100%   Filed Weights   12/05/20 1436  Weight: 149 lb 6.4 oz (67.8 kg)    Physical Exam Constitutional:      Comments: Walk independently.  Accompanied by husband.  HENT:     Head: Normocephalic and atraumatic.     Mouth/Throat:     Pharynx: No oropharyngeal exudate.  Eyes:     Pupils: Pupils are equal, round, and reactive to light.  Cardiovascular:  Rate and Rhythm: Normal rate and regular rhythm.     Pulses: Normal pulses.     Heart sounds: Normal heart sounds.  Pulmonary:     Effort: Pulmonary effort is normal. No respiratory distress.     Breath sounds: Normal breath sounds. No wheezing.  Abdominal:     General: Bowel sounds are normal. There is no distension.     Palpations: Abdomen is soft. There is no mass.     Tenderness: There is no abdominal tenderness. There is no guarding or rebound.  Musculoskeletal:        General: No tenderness. Normal range of motion.     Cervical back: Normal range of motion and neck supple.  Skin:    General: Skin is warm.  Neurological:     Mental Status: She is alert and oriented to person, place, and time.  Psychiatric:        Mood and Affect: Affect normal.     LABORATORY DATA:  I have reviewed the data as listed Lab Results  Component Value Date   WBC 4.4 12/03/2020   HGB 10.3 (L) 12/03/2020   HCT 30.0 (L) 12/03/2020   MCV 105.6 (H) 12/03/2020   PLT 262 12/03/2020   Recent Labs    05/20/20 0839 05/22/20 0829  06/03/20 1351 06/17/20 1516 06/24/20 1504 07/01/20 1301 11/07/20 1001 11/21/20 1110 12/03/20 1119  NA 127*   < > 137 138 140   < > 138 138 139  K 3.8   < > 4.3 3.6 3.5   < > 3.9 3.9 3.8  CL 96*   < > 104 104 105   < > 103 105 104  CO2 21*   < > '26 27 27   ' < > '25 24 25  ' GLUCOSE 114*   < > 113* 134* 101*   < > 135* 137* 126*  BUN 29*   < > '15 13 8   ' < > '14 16 17  ' CREATININE 1.23*   < > 1.10* 0.95 1.08*   < > 0.91 1.01* 0.96  CALCIUM 8.2*   < > 8.2* 8.5* 8.6*   < > 9.0 8.9 9.0  GFRNONAA 45*   < > 51* >60 52*   < > >60 60* >60  GFRAA 52*   < > 59* >60 >60  --   --   --   --   PROT 7.6  --  7.3 7.7 7.7   < > 7.9 8.4* 8.2*  ALBUMIN 3.3*  --  3.5 3.9 4.1   < > 4.1 4.2 4.1  AST 86*  --  '19 17 18   ' < > 24 48* 27  ALT 57*  --  '20 15 15   ' < > 19 120* 26  ALKPHOS 32*  --  43 51 50   < > 51 76 55  BILITOT 1.7*  --  1.6* 1.0 1.1   < > 1.1 1.1 1.0  BILIDIR 0.5*  --   --   --   --   --   --   --   --   IBILI 1.2*  --   --   --   --   --   --   --   --    < > = values in this interval not displayed.     No results found.  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 POSITIVE  For SF3B1; R-IPSS-low  risk. Poor response to Aranesp; currently on Aranesp 500 mcg weekly subcu injections; and  lenalidomide 10 mg  3w-ON & 1 w-OFF-tolerating fairly well.    # Hemoglobin is 10.2; HOLD Aranesp.  Monitor closely. Continue Revlimid 2m; days-1-21; 7 days OFF. STABLE.   # Diarrhea-G-1; sec to revlimid- monitor for now- STABLE.   # DISPOSITION:  # NO Aranesp # in 3 weeks- cbc/possible aranesp #  in 6 weeks- cbc/possible aranesp # follow up in 9 weeks  MD; labs- cbc/Cmp;LDH;iron studies/ferritin- possible; Aranesp SQ- Dr.B  All questions were answered. The patient knows to call the clinic with any problems, questions or concerns.   GCammie Sickle MD 12/08/2020 8:19 PM

## 2020-12-12 ENCOUNTER — Telehealth: Payer: Self-pay | Admitting: *Deleted

## 2020-12-12 DIAGNOSIS — D4620 Refractory anemia with excess of blasts, unspecified: Secondary | ICD-10-CM

## 2020-12-12 MED ORDER — LENALIDOMIDE 10 MG PO CAPS: 10.0000 mg | ORAL_CAPSULE | Freq: Every day | ORAL | 0 refills | Status: DC

## 2020-12-12 NOTE — Telephone Encounter (Signed)
Incoming fax from [employer] to request for RF on revlimid.   [id number]- rems survey # completed.  RF submitted.

## 2020-12-31 ENCOUNTER — Inpatient Hospital Stay: Payer: PPO

## 2020-12-31 ENCOUNTER — Inpatient Hospital Stay: Payer: PPO | Attending: Internal Medicine

## 2020-12-31 DIAGNOSIS — D4620 Refractory anemia with excess of blasts, unspecified: Secondary | ICD-10-CM | POA: Insufficient documentation

## 2020-12-31 DIAGNOSIS — I50811 Acute right heart failure: Secondary | ICD-10-CM

## 2020-12-31 LAB — COMPREHENSIVE METABOLIC PANEL
ALT: 20 U/L (ref 0–44)
AST: 22 U/L (ref 15–41)
Albumin: 4.2 g/dL (ref 3.5–5.0)
Alkaline Phosphatase: 50 U/L (ref 38–126)
Anion gap: 6 (ref 5–15)
BUN: 20 mg/dL (ref 8–23)
CO2: 27 mmol/L (ref 22–32)
Calcium: 9.2 mg/dL (ref 8.9–10.3)
Chloride: 105 mmol/L (ref 98–111)
Creatinine, Ser: 1 mg/dL (ref 0.44–1.00)
GFR, Estimated: >60 mL/min (ref >60)
Glucose, Bld: 101 mg/dL — ABNORMAL HIGH (ref 70–99)
Potassium: 4.2 mmol/L (ref 3.5–5.1)
Sodium: 138 mmol/L (ref 135–145)
Total Bilirubin: 1 mg/dL (ref 0.3–1.2)
Total Protein: 8 g/dL (ref 6.5–8.1)

## 2020-12-31 LAB — CBC WITH DIFFERENTIAL/PLATELET
Abs Immature Granulocytes: 0.01 10*3/uL (ref 0.00–0.07)
Basophils Absolute: 0.1 10*3/uL (ref 0.0–0.1)
Basophils Relative: 2 %
Eosinophils Absolute: 0.2 10*3/uL (ref 0.0–0.5)
Eosinophils Relative: 3 %
HCT: 29.3 % — ABNORMAL LOW (ref 36.0–46.0)
Hemoglobin: 10.2 g/dL — ABNORMAL LOW (ref 12.0–15.0)
Immature Granulocytes: 0 %
Lymphocytes Relative: 40 %
Lymphs Abs: 1.8 10*3/uL (ref 0.7–4.0)
MCH: 36.7 pg — ABNORMAL HIGH (ref 26.0–34.0)
MCHC: 34.8 g/dL (ref 30.0–36.0)
MCV: 105.4 fL — ABNORMAL HIGH (ref 80.0–100.0)
Monocytes Absolute: 0.5 10*3/uL (ref 0.1–1.0)
Monocytes Relative: 12 %
Neutro Abs: 2 10*3/uL (ref 1.7–7.7)
Neutrophils Relative %: 43 %
Platelets: 200 10*3/uL (ref 150–400)
RBC: 2.78 MIL/uL — ABNORMAL LOW (ref 3.87–5.11)
RDW: 15 % (ref 11.5–15.5)
WBC: 4.6 10*3/uL (ref 4.0–10.5)
nRBC: 0 % (ref 0.0–0.2)

## 2021-01-01 ENCOUNTER — Encounter: Admit: 2021-01-01 | Discharge: 2021-01-01 | Payer: MEDICARE

## 2021-01-01 NOTE — Progress Notes
"  Hi guys, I have cancelled appointments for pt Kaitlyn Friedman [id number], she called me saying she is in pain and does not think she can make the trip to her appointments tomorrow with Thedora Hinders, can someone give her a call for scheduling?"  Called patient to discuss above message. Per patient, she got the appts rescheduled to 5/2 and does not need anything at this time. She is having back pain which she is seeing her primary for. She has no questions/concerns.

## 2021-01-13 ENCOUNTER — Other Ambulatory Visit: Payer: Self-pay | Admitting: *Deleted

## 2021-01-13 DIAGNOSIS — D4620 Refractory anemia with excess of blasts, unspecified: Secondary | ICD-10-CM

## 2021-01-13 MED ORDER — LENALIDOMIDE 10 MG PO CAPS: 10.0000 mg | ORAL_CAPSULE | Freq: Every day | ORAL | 0 refills | Status: DC

## 2021-01-13 NOTE — Telephone Encounter (Signed)
This has already been sent

## 2021-01-21 ENCOUNTER — Inpatient Hospital Stay: Payer: PPO

## 2021-01-21 DIAGNOSIS — D4620 Refractory anemia with excess of blasts, unspecified: Secondary | ICD-10-CM | POA: Diagnosis not present

## 2021-01-21 DIAGNOSIS — I50811 Acute right heart failure: Secondary | ICD-10-CM

## 2021-01-21 LAB — IRON AND TIBC
Iron: 181 ug/dL — ABNORMAL HIGH (ref 28–170)
Saturation Ratios: 64 % — ABNORMAL HIGH (ref 10.4–31.8)
TIBC: 284 ug/dL (ref 250–450)
UIBC: 103 ug/dL

## 2021-01-21 LAB — CBC WITH DIFFERENTIAL/PLATELET
Abs Immature Granulocytes: 0.01 10*3/uL (ref 0.00–0.07)
Basophils Absolute: 0.1 10*3/uL (ref 0.0–0.1)
Basophils Relative: 3 %
Eosinophils Absolute: 0.5 10*3/uL (ref 0.0–0.5)
Eosinophils Relative: 14 %
HCT: 29.6 % — ABNORMAL LOW (ref 36.0–46.0)
Hemoglobin: 10.1 g/dL — ABNORMAL LOW (ref 12.0–15.0)
Immature Granulocytes: 0 %
Lymphocytes Relative: 40 %
Lymphs Abs: 1.4 10*3/uL (ref 0.7–4.0)
MCH: 35.9 pg — ABNORMAL HIGH (ref 26.0–34.0)
MCHC: 34.1 g/dL (ref 30.0–36.0)
MCV: 105.3 fL — ABNORMAL HIGH (ref 80.0–100.0)
Monocytes Absolute: 0.4 10*3/uL (ref 0.1–1.0)
Monocytes Relative: 12 %
Neutro Abs: 1.1 10*3/uL — ABNORMAL LOW (ref 1.7–7.7)
Neutrophils Relative %: 31 %
Platelets: 141 10*3/uL — ABNORMAL LOW (ref 150–400)
RBC: 2.81 MIL/uL — ABNORMAL LOW (ref 3.87–5.11)
RDW: 14.5 % (ref 11.5–15.5)
WBC: 3.6 10*3/uL — ABNORMAL LOW (ref 4.0–10.5)
nRBC: 0 % (ref 0.0–0.2)

## 2021-01-21 LAB — COMPREHENSIVE METABOLIC PANEL
ALT: 19 U/L (ref 0–44)
AST: 23 U/L (ref 15–41)
Albumin: 4.1 g/dL (ref 3.5–5.0)
Alkaline Phosphatase: 45 U/L (ref 38–126)
Anion gap: 10 (ref 5–15)
BUN: 17 mg/dL (ref 8–23)
CO2: 25 mmol/L (ref 22–32)
Calcium: 9 mg/dL (ref 8.9–10.3)
Chloride: 102 mmol/L (ref 98–111)
Creatinine, Ser: 1.02 mg/dL — ABNORMAL HIGH (ref 0.44–1.00)
GFR, Estimated: 59 mL/min — ABNORMAL LOW (ref >60)
Glucose, Bld: 121 mg/dL — ABNORMAL HIGH (ref 70–99)
Potassium: 3.7 mmol/L (ref 3.5–5.1)
Sodium: 137 mmol/L (ref 135–145)
Total Bilirubin: 0.9 mg/dL (ref 0.3–1.2)
Total Protein: 8.1 g/dL (ref 6.5–8.1)

## 2021-01-21 LAB — FERRITIN: Ferritin: 504 ng/mL — ABNORMAL HIGH (ref 11–307)

## 2021-01-21 LAB — LACTATE DEHYDROGENASE: LDH: 101 U/L (ref 98–192)

## 2021-01-22 LAB — SAMPLE TO BLOOD BANK

## 2021-01-26 ENCOUNTER — Encounter: Admit: 2021-01-26 | Discharge: 2021-01-26 | Payer: MEDICARE

## 2021-01-26 DIAGNOSIS — E669 Obesity, unspecified: Secondary | ICD-10-CM

## 2021-01-26 DIAGNOSIS — H3530 Unspecified macular degeneration: Secondary | ICD-10-CM

## 2021-01-26 DIAGNOSIS — C50919 Malignant neoplasm of unspecified site of unspecified female breast: Secondary | ICD-10-CM

## 2021-01-26 DIAGNOSIS — E559 Vitamin D deficiency, unspecified: Secondary | ICD-10-CM

## 2021-01-26 DIAGNOSIS — I471 Supraventricular tachycardia: Secondary | ICD-10-CM

## 2021-01-26 DIAGNOSIS — C4362 Malignant melanoma of left upper limb, including shoulder: Secondary | ICD-10-CM

## 2021-01-26 DIAGNOSIS — R002 Palpitations: Secondary | ICD-10-CM

## 2021-01-26 DIAGNOSIS — M1990 Unspecified osteoarthritis, unspecified site: Secondary | ICD-10-CM

## 2021-01-26 DIAGNOSIS — I10 Essential (primary) hypertension: Secondary | ICD-10-CM

## 2021-01-26 DIAGNOSIS — C439 Malignant melanoma of skin, unspecified: Secondary | ICD-10-CM

## 2021-01-26 LAB — COMPREHENSIVE METABOLIC PANEL
ALBUMIN: 4.3 g/dL (ref 3.5–5.0)
ALK PHOSPHATASE: 92 U/L (ref 25–110)
ALT: 20 U/L (ref 7–56)
ANION GAP: 7 K/UL (ref 3–12)
AST: 23 U/L (ref 7–40)
BLD UREA NITROGEN: 17 mg/dL (ref 7–25)
CALCIUM: 9.5 mg/dL (ref 8.5–10.6)
CHLORIDE: 107 MMOL/L (ref 98–110)
CO2: 27 MMOL/L (ref 21–30)
CREATININE: 0.6 mg/dL (ref 0.4–1.00)
EGFR: >60 mL/min — ABNORMAL HIGH (ref >60)
GLUCOSE,PANEL: 71 mg/dL (ref 70–100)
POTASSIUM: 4.1 MMOL/L (ref 3.5–5.1)
SODIUM: 141 MMOL/L (ref 137–147)
TOTAL BILIRUBIN: 0.5 mg/dL (ref 0.3–1.2)
TOTAL PROTEIN: 7 g/dL (ref 6.0–8.0)

## 2021-01-26 LAB — CBC AND DIFF
ABSOLUTE BASO COUNT: 0.1 K/UL (ref 0–0.20)
ABSOLUTE EOS COUNT: 0.2 K/UL (ref 0–0.45)
RBC COUNT: 5.3 M/UL — ABNORMAL HIGH (ref 4.0–5.0)
WBC COUNT: 8.6 K/UL (ref 4.5–11.0)

## 2021-01-26 LAB — 25-OH VITAMIN D (D2 + D3): VITAMIN D (25-OH) TOTAL: 29 ng/mL — ABNORMAL LOW (ref 30–80)

## 2021-01-26 NOTE — Progress Notes
Name: Kaitlyn Friedman          MRN: [id number]      DOB: [birth year]      AGE: 70 y.o.   DATE OF SERVICE: 01/26/2021    Subjective:             Reason for Visit:  Follow Up      Kaitlyn Friedman is a 70 y.o. female.     Cancer Staging  Malignant neoplasm of upper-outer quadrant of left female breast [facility])  Staging form: Breast, AJCC 7th Edition  - Pathologic: Stage IA (T1, N0, cM0) - Signed by Cammy Copa, [name] on 03/28/2015    Melanoma [facility])  Staging form: Melanoma of the Skin, AJCC 7th Edition  - Pathologic: Stage IB (T2a, N0, cM0) - Signed by Sherril Croon, [name] on 02/21/2015      History of Present Illness      Ms. Tumminello presents today for follow up of her history of malignant melanoma.   She feels well, denies complaints.   Denies new skin changes.   She derm last month.         Review of Systems   Constitutional: Negative for activity change, appetite change, chills, fatigue and fever.   HENT: Negative for mouth sores.    Respiratory: Negative for shortness of breath.    Cardiovascular: Negative for chest pain and leg swelling.   Gastrointestinal: Negative for diarrhea, nausea and vomiting.   Musculoskeletal: Negative for arthralgias and myalgias.   Skin: Negative for rash.   Neurological: Negative for seizures, weakness, light-headedness and headaches.   Psychiatric/Behavioral: The patient is not nervous/anxious.          Objective:         ? Cholecalciferol (Vitamin D3) 2,000 unit cap take by mouth daily   ? cyclobenzaprine (FLEXERIL) 10 mg tablet    ? ibuprofen (MOTRIN) 800 mg tablet Take 1 tablet by mouth every 6 hours as needed for Pain. Take with food.   ? lisinopril (PRINIVIL, ZESTRIL) 40 mg tablet Take 40 mg by mouth at bedtime daily.   ? metoprolol XL (TOPROL XL) 50 mg tablet Take 50 mg by mouth at bedtime daily.   ? MULTIVITAMIN PO Take 1 tablet by mouth daily.   ? nitrofurantoin monohyd/m-cryst (MACROBID) 100 mg capsule Take one capsule by mouth every 12 hours. Take with food.   ? oxybutynin XL (DITROPAN XL) 5 mg tablet Take one tablet by mouth daily.   ? Vitamin A-Vitamin C-Vit E-Min (PRESERVISION AREDS) cap Take 1 capsule by mouth daily.     Vitals:    01/26/21 0854   BP: (!) 171/92   BP Source: Arm, Right Upper   Pulse: 58   Temp: 36.2 ?C (97.1 ?F)   Resp: 16   SpO2: 100%   TempSrc: Skin   PainSc: Zero   Weight: 112.9 kg (248 lb 12.8 oz)   Height: 154.9 cm (5' 1)     Body mass index is 47.01 kg/m?Marland Kitchen     Pain Score: Zero       Fatigue Scale: 0-None    Pain Addressed:  N/A    Patient Evaluated for a Clinical Trial: Patient not eligible for a treatment trial (including not needing treatment, needs palliative care, in remission).     Kaitlyn Friedman Cooperative Oncology Group performance status is 0, Fully active, able to carry on all pre-disease performance without restriction.Marland Kitchen     Physical Exam  Constitutional:       General:  She is not in acute distress.     Appearance: She is well-developed. She is obese.   HENT:      Head: Normocephalic.   Eyes:      Conjunctiva/sclera: Conjunctivae normal.      Pupils: Pupils are equal, round, and reactive to light.   Cardiovascular:      Rate and Rhythm: Normal rate and regular rhythm.      Heart sounds: Normal heart sounds.   Pulmonary:      Effort: Pulmonary effort is normal.      Breath sounds: Normal breath sounds.   Abdominal:      General: Bowel sounds are normal.      Palpations: Abdomen is soft. There is no mass.      Tenderness: There is no abdominal tenderness. There is no guarding.   Lymphadenopathy:      Cervical: No cervical adenopathy.   Skin:     General: Skin is warm and dry.      Findings: No rash.      Comments: FBSE performed, no suspicious lesions noted    Primary site clear    Neurological:      Mental Status: She is alert and oriented to person, place, and time.      Cranial Nerves: No cranial nerve deficit.   Psychiatric:         Behavior: Behavior normal.         Thought Content: Thought content normal.         Judgment: Judgment normal.               Assessment and Plan:    Problem   Melanoma (Hcc)    In 2016, patient was referred to Dr Frederik Pear for excision of lesion on her left arm which was very large and dark.  Patient didn't know how long it was there as the location was hard for her to see.  She noticed it a few months before but didn't think much about until it started getting larger and changing.  Now is newly diagnosed with malignant melanoma after biopsy taken, Breslow thickness 2.1 mm, non-ulcerated, margins not involved. She then underwent a WLE with SLN Biopsy on 02/07/15 which found no residual melanoma and was negative for mets in all 8 LNs tested. She has since followed with Dr. Wallie Char for routine restaging with chest x-rays every 6 months. The last of which, taken 09/18/19, found no abnormalities.  She has a hx of breast cancer with prior lumpectomy performed in 2013 by Dr Cathrine Muster.  She also reports having a history of double mastectomy with clear SLNs.  TIMELINE:  12/24/14  Path  Skin, left arm --Invasive Nodular melanoma.  Tumor thickness 2.1 mm.  Anatomic level IV.  Ulceration not identified. Margins uninvolved. Closest margin 0.7 mm.  --Mitotic rate greater than 1/mm2: 6/mm2.  Microsatellitosis not identified. Lymphovascular invasion and perineural invasion not identified.  --Tumor infiltrating lymphocytes present, non brisk.  Tumor regression not identified   WLE with SLN Bx 02/07/15:   A. left axillary sentinel lymph node #1: ?   -- Negative for melanoma (0/2).     B. left axillary sentinel lymph node #2: ?   -- Negative for melanoma (0/1).     C. left axillary sentinel lymph node #3: ?   -- Negative for melanoma (0/5).     D. left arm:   -- Postoperative scar.   -- Negative for residual melanoma.     CHEST 2  VIEWS 09/18/19:   Stable chest radiograph demonstrating no acute cardiopulmonary   abnormalities.     CONSENTED TO SURVIVORSHIP REGISTRY: 01/02/20         Melanoma (HCC)  No progressive disease noted by history or on exam   Continue surveillance PLAN  -Continue surveillance  -Discussed genetics counseling given her personal and family history of malignant melanoma and breast cancer  She defers  -RTC in 1year, CBC, CMP, Vit D prior  -Encouraged her to take vitamin d 2000 IU daily  -Encouraged close F/U in the interim as needed.  Ms. Dimino verbalized understanding and agrees with the plan as outlined above      For up-to-date information on COVID-19, visit the [facility] website. [email or web address]  ? General supportive care during cold and flu season and infection prevention reminders:   o Wash hands often with soap and water for at least 20 seconds or use hand sanitizer with at least 60% alcohol.   o Cover your mouth and nose.  o Practice social distancing: Try to maintain 6 feet between you and people outside your household.  o Avoid crowds and poorly ventilated spaces.  o Stay home if sick and symptoms are mild or manageable. If you must be around other people, wear a mask and practice social distancing.     ? If you are having symptoms of a lower respiratory infection (cough, shortness of breath) and/or fever or have been exposed to someone with COVID-19:    o Call your primary care provider for questions or health needs.   - Tell your doctor about your recent travel and your symptoms          In a medical emergency, call 911 or go to the nearest emergency room.        Twanna Hy, APRN    My supervising physician for this visit is Dr. Adora Fridge.

## 2021-01-26 NOTE — Assessment & Plan Note
No progressive disease noted by history or on exam   Continue surveillance

## 2021-02-06 ENCOUNTER — Other Ambulatory Visit: Payer: Self-pay | Admitting: *Deleted

## 2021-02-06 DIAGNOSIS — D4620 Refractory anemia with excess of blasts, unspecified: Secondary | ICD-10-CM

## 2021-02-06 MED ORDER — LENALIDOMIDE 10 MG PO CAPS: 10.0000 mg | ORAL_CAPSULE | Freq: Every day | ORAL | 0 refills | Status: DC

## 2021-02-10 ENCOUNTER — Telehealth: Payer: Self-pay | Admitting: Family Medicine

## 2021-02-10 NOTE — Telephone Encounter (Signed)
LVM for pt to rtn my call to r/s appt with nha on 03/09/21.

## 2021-02-11 ENCOUNTER — Other Ambulatory Visit: Payer: PPO

## 2021-02-11 ENCOUNTER — Ambulatory Visit: Payer: PPO | Admitting: Oncology

## 2021-02-11 ENCOUNTER — Ambulatory Visit: Payer: PPO

## 2021-02-17 ENCOUNTER — Inpatient Hospital Stay (HOSPITAL_BASED_OUTPATIENT_CLINIC_OR_DEPARTMENT_OTHER): Payer: PPO | Admitting: Internal Medicine

## 2021-02-17 ENCOUNTER — Encounter: Payer: Self-pay | Admitting: Internal Medicine

## 2021-02-17 ENCOUNTER — Other Ambulatory Visit: Payer: Self-pay

## 2021-02-17 ENCOUNTER — Inpatient Hospital Stay: Payer: PPO | Attending: Internal Medicine

## 2021-02-17 ENCOUNTER — Inpatient Hospital Stay: Payer: PPO

## 2021-02-17 DIAGNOSIS — D4620 Refractory anemia with excess of blasts, unspecified: Secondary | ICD-10-CM

## 2021-02-17 DIAGNOSIS — D46Z Other myelodysplastic syndromes: Secondary | ICD-10-CM

## 2021-02-17 DIAGNOSIS — I50811 Acute right heart failure: Secondary | ICD-10-CM

## 2021-02-17 LAB — CBC WITH DIFFERENTIAL/PLATELET
Abs Immature Granulocytes: 0.01 10*3/uL (ref 0.00–0.07)
Basophils Absolute: 0.1 10*3/uL (ref 0.0–0.1)
Basophils Relative: 2 %
Eosinophils Absolute: 0.4 10*3/uL (ref 0.0–0.5)
Eosinophils Relative: 12 %
HCT: 28.5 % — ABNORMAL LOW (ref 36.0–46.0)
Hemoglobin: 9.8 g/dL — ABNORMAL LOW (ref 12.0–15.0)
Immature Granulocytes: 0 %
Lymphocytes Relative: 39 %
Lymphs Abs: 1.4 10*3/uL (ref 0.7–4.0)
MCH: 35.9 pg — ABNORMAL HIGH (ref 26.0–34.0)
MCHC: 34.4 g/dL (ref 30.0–36.0)
MCV: 104.4 fL — ABNORMAL HIGH (ref 80.0–100.0)
Monocytes Absolute: 0.4 10*3/uL (ref 0.1–1.0)
Monocytes Relative: 12 %
Neutro Abs: 1.2 10*3/uL — ABNORMAL LOW (ref 1.7–7.7)
Neutrophils Relative %: 35 %
Platelets: 137 10*3/uL — ABNORMAL LOW (ref 150–400)
RBC: 2.73 MIL/uL — ABNORMAL LOW (ref 3.87–5.11)
RDW: 15.1 % (ref 11.5–15.5)
WBC: 3.5 10*3/uL — ABNORMAL LOW (ref 4.0–10.5)
nRBC: 0 % (ref 0.0–0.2)

## 2021-02-17 LAB — COMPREHENSIVE METABOLIC PANEL
ALT: 19 U/L (ref 0–44)
AST: 21 U/L (ref 15–41)
Albumin: 4.3 g/dL (ref 3.5–5.0)
Alkaline Phosphatase: 52 U/L (ref 38–126)
Anion gap: 10 (ref 5–15)
BUN: 14 mg/dL (ref 8–23)
CO2: 25 mmol/L (ref 22–32)
Calcium: 9.1 mg/dL (ref 8.9–10.3)
Chloride: 102 mmol/L (ref 98–111)
Creatinine, Ser: 0.93 mg/dL (ref 0.44–1.00)
GFR, Estimated: >60 mL/min (ref >60)
Glucose, Bld: 125 mg/dL — ABNORMAL HIGH (ref 70–99)
Potassium: 3.7 mmol/L (ref 3.5–5.1)
Sodium: 137 mmol/L (ref 135–145)
Total Bilirubin: 1.2 mg/dL (ref 0.3–1.2)
Total Protein: 8 g/dL (ref 6.5–8.1)

## 2021-02-17 MED ORDER — DARBEPOETIN ALFA 500 MCG/ML IJ SOSY
500.0000 ug | PREFILLED_SYRINGE | Freq: Once | INTRAMUSCULAR | Status: AC
  Administered 2021-02-17: 500 ug via SUBCUTANEOUS
  Filled 2021-02-17: qty 1

## 2021-02-17 NOTE — Assessment & Plan Note (Addendum)
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 Poor response to Aranesp; currently on Aranesp 500 mcg weekly subcu injections; and  lenalidomide 10 mg  3w-ON & 1 w-OFF-tolerating fairly well except for mild GI effects/mild neutropenia.    # Hemoglobin is 9.8; proceed with Aranesp.  Monitor closely. Continue Revlimid 35m; days-1-21; 7 days OFF. STABLE.   # Diarrhea/dyspepsis-G-1; sec to revlimid- monitor for now- STABLE.   # Myalgias- ? Rev; prn tylenol; check vit D  # DISPOSITION:  # Aranesp today # in 4 weeks- cbc/possible aranesp; MD; labs- cbc/Cmp;LDH;Vit D level- possible; Aranesp SQ- Dr.B

## 2021-02-17 NOTE — Progress Notes (Signed)
[facility] CONSULT NOTE  Patient Care Team: [name], [name], MD as PCP - General Rogue Bussing, [name], MD as Consulting Physician (Hematology and Oncology)  CHIEF COMPLAINTS/PURPOSE OF CONSULTATION: MDS   Oncology History Overview Note   # July 2020-myelodysplastic syndrome with ringed sideroblasts-Normal karyotype; no blasts [IPSS R-Very low risk ~median survival 8.3 years]; iron studies I29 folic acid myeloma panel normal; pyridoxine levels/copper/zinc-WNL. Erythropoietin levels-60. 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  female history of low-grade MDS anemia with ring sideroblasts currently on Aranesp/Revlimid is here for follow-up.  Patient denies any nausea vomiting.  However admits to intermittent dyspepsia.  Complains of intermittent myalgias arthralgias.  She does not use any NSAIDs.  Denies any skin rash.  Mild intermittent loose stools.  No infections.  No paresthesias.  Review of Systems  Constitutional: Positive for malaise/fatigue. Negative for chills, diaphoresis and fever.  HENT: Negative for nosebleeds and sore throat.   Eyes: Negative for double vision.  Respiratory: Negative for cough, hemoptysis, sputum production,  shortness of breath and wheezing.   Cardiovascular: Negative for chest pain and orthopnea.  Gastrointestinal: Negative for abdominal pain, blood in stool, constipation, diarrhea, heartburn, melena and vomiting.  Genitourinary: Negative for dysuria, frequency and urgency.  Musculoskeletal: Positive for back pain and joint pain.  Skin: Negative.  Negative for itching and rash.  Neurological: Negative for dizziness, tingling, focal weakness, weakness and headaches.  Endo/Heme/Allergies: Does not bruise/bleed easily.  Psychiatric/Behavioral: Negative for depression. The patient is not nervous/anxious and does not have insomnia.     MEDICAL HISTORY:  Past Medical History:  Diagnosis Date  . Allergic rhinitis, cause unspecified   . Anemia, unspecified   . Carpal tunnel syndrome   . Cystitis, unspecified   . Family history of osteoporosis   . PONV (postoperative nausea and vomiting)   . Postnasal drip   . Syncope and collapse     SURGICAL HISTORY: Past Surgical History:  Procedure Laterality Date  . CARPAL TUNNEL RELEASE    . COLONOSCOPY  2005  . COLONOSCOPY WITH PROPOFOL N/A 07/31/2015   Procedure: COLONOSCOPY WITH PROPOFOL;  Surgeon: RRonald Lobo MD;  Location: [facility];  Service: Endoscopy;  Laterality: N/A;  . DIAGNOSTIC LAPAROSCOPY     dx lack of pregnancy   . EYE SURGERY     lasik, cataract, prk,yag procedure    SOCIAL HISTORY: Social History   Socioeconomic History  . Marital status: Married    Spouse name: Not on file  . Number of children: Not on file  . Years of education: Not on file  . Highest education level: Not on file  Occupational History  . Not on file  Tobacco Use  . Smoking status: Never Smoker  . Smokeless tobacco: Never Used  Vaping Use  . Vaping Use: Never used  Substance and Sexual Activity  .  Alcohol use: Yes    Alcohol/week: 0.0 standard drinks    Comment: Rare  . Drug use: No  . Sexual activity: Not Currently  Other Topics Concern   . Not on file  Social History Narrative   No regular exercise      Drinks lots of [employer]eps[employer]         Social Determinants of Health   Financial Resource Strain: Low Risk   . Difficulty of Paying Living Expenses: Not hard at all  Food Insecurity: No Food Insecurity  . Worried About Charity fundraiser in the Last Year: Never true  . Ran Out of Food in the Last Year: Never true  Transportation Needs: No Transportation Needs  . Lack of Transportation (Medical): No  . Lack of Transportation (Non-Medical): No  Physical Activity: Inactive  . Days of Exercise per Week: 0 days  . Minutes of Exercise per Session: 0 min  Stress: No Stress Concern Present  . Feeling of Stress : Not at all  Social Connections: Not on file  Intimate Partner Violence: Not At Risk  . Fear of Current or Ex-Partner: No  . Emotionally Abused: No  . Physically Abused: No  . Sexually Abused: No    FAMILY HISTORY: Family History  Problem Relation Age of Onset  . Osteoporosis Mother   . Stroke Mother   . Coronary artery disease Father   . Stroke Father 60  . Diabetes Other        Aunts and uncles  . Breast cancer Paternal Aunt   . Breast cancer Maternal Aunt   . Arthritis/Rheumatoid Child   . Breast cancer Cousin     ALLERGIES:  is allergic to codeine.  MEDICATIONS:  Current Outpatient Medications  Medication Sig Dispense Refill  . acetaminophen (TYLENOL) 500 MG tablet Take 500-1,000 mg by mouth every 6 (six) hours as needed for mild pain or headache.    Marland Kitchen aspirin EC 81 MG tablet Take 81 mg by mouth at bedtime. Swallow whole.     Marland Kitchen BIOTIN PO Take 1 tablet by mouth 3 (three) times a week.     . Calcium Carb-Cholecalciferol (CALCIUM 600+D3 PO) Take 1 tablet by mouth 3 (three) times a week.     . Darbepoetin Alfa (ARANESP) 500 MCG/ML SOSY injection Inject 500 mcg into the skin See admin instructions. Inject 500 mcg into the skin every week    . lenalidomide (REVLIMID) 10 MG capsule Take 1 capsule (10 mg  total) by mouth daily. 3 weeks-On and 1 week-OFF. 21 capsule 0  . levothyroxine (SYNTHROID) 25 MCG tablet Take 1 tablet (25 mcg total) by mouth daily before breakfast. 90 tablet 3  . loperamide (IMODIUM) 2 MG capsule Take by mouth as needed for diarrhea or loose stools.    . ondansetron (ZOFRAN ODT) 8 MG disintegrating tablet Take 1 tablet (8 mg total) by mouth every 8 (eight) hours as needed for nausea or vomiting. 30 tablet 0   No current facility-administered medications for this visit.   Facility-Administered Medications Ordered in Other Visits  Medication Dose Route Frequency Provider Last Rate Last Admin  . Darbepoetin Alfa (ARANESP) injection 500 mcg  500 mcg Subcutaneous Once Charlaine Dalton R, MD          PHYSICAL EXAMINATION:   Vitals:   02/17/21 0950  BP: 101/64  Pulse: 61  Resp: 16  Temp: 97.6 F (36.4 C)  SpO2: 98%   Filed Weights   02/17/21 0950  Weight: 150 lb (  68 kg)    Physical Exam Constitutional:      Comments: Walk independently.  Accompanied by husband.  HENT:     Head: Normocephalic and atraumatic.     Mouth/Throat:     Pharynx: No oropharyngeal exudate.  Eyes:     Pupils: Pupils are equal, round, and reactive to light.  Cardiovascular:     Rate and Rhythm: Normal rate and regular rhythm.     Pulses: Normal pulses.     Heart sounds: Normal heart sounds.  Pulmonary:     Effort: Pulmonary effort is normal. No respiratory distress.     Breath sounds: Normal breath sounds. No wheezing.  Abdominal:     General: Bowel sounds are normal. There is no distension.     Palpations: Abdomen is soft. There is no mass.     Tenderness: There is no abdominal tenderness. There is no guarding or rebound.  Musculoskeletal:        General: No tenderness. Normal range of motion.     Cervical back: Normal range of motion and neck supple.  Skin:    General: Skin is warm.  Neurological:     Mental Status: She is alert and oriented to person, place, and time.   Psychiatric:        Mood and Affect: Affect normal.     LABORATORY DATA:  I have reviewed the data as listed Lab Results  Component Value Date   WBC 3.5 (L) 02/17/2021   HGB 9.8 (L) 02/17/2021   HCT 28.5 (L) 02/17/2021   MCV 104.4 (H) 02/17/2021   PLT 137 (L) 02/17/2021   Recent Labs    05/20/20 0839 05/22/20 0829 06/03/20 1351 06/17/20 1516 06/24/20 1504 07/01/20 1301 12/31/20 1108 01/21/21 1052 02/17/21 0922  NA 127*   < > 137 138 140   < > 138 137 137  K 3.8   < > 4.3 3.6 3.5   < > 4.2 3.7 3.7  CL 96*   < > 104 104 105   < > 105 102 102  CO2 21*   < > '26 27 27   ' < > '27 25 25  ' GLUCOSE 114*   < > 113* 134* 101*   < > 101* 121* 125*  BUN 29*   < > '15 13 8   ' < > '20 17 14  ' CREATININE 1.23*   < > 1.10* 0.95 1.08*   < > 1.00 1.02* 0.93  CALCIUM 8.2*   < > 8.2* 8.5* 8.6*   < > 9.2 9.0 9.1  GFRNONAA 45*   < > 51* >60 52*   < > >60 59* >60  GFRAA 52*   < > 59* >60 >60  --   --   --   --   PROT 7.6  --  7.3 7.7 7.7   < > 8.0 8.1 8.0  ALBUMIN 3.3*  --  3.5 3.9 4.1   < > 4.2 4.1 4.3  AST 86*  --  '19 17 18   ' < > '22 23 21  ' ALT 57*  --  '20 15 15   ' < > '20 19 19  ' ALKPHOS 32*  --  43 51 50   < > 50 45 52  BILITOT 1.7*  --  1.6* 1.0 1.1   < > 1.0 0.9 1.2  BILIDIR 0.5*  --   --   --   --   --   --   --   --  IBILI 1.2*  --   --   --   --   --   --   --   --    < > = values in this interval not displayed.     No results found.  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 POSITIVE  For SF3B1; R-IPSS-low risk. Poor response to Aranesp; currently on Aranesp 500 mcg weekly subcu injections; and  lenalidomide 10 mg  3w-ON & 1 w-OFF-tolerating fairly well except for mild GI effects/mild neutropenia.    # Hemoglobin is 9.8; proceed with Aranesp.  Monitor closely. Continue Revlimid 19m; days-1-21; 7 days OFF. STABLE.   # Diarrhea/dyspepsis-G-1; sec to revlimid- monitor for now- STABLE.   # Myalgias- ? Rev; prn tylenol; check  vit D  # DISPOSITION:  # Aranesp today # in 4 weeks- cbc/possible aranesp; MD; labs- cbc/Cmp;LDH;Vit D level- possible; Aranesp SQ- Dr.B  All questions were answered. The patient knows to call the clinic with any problems, questions or concerns.   GCammie Sickle MD 02/17/2021 10:37 AM

## 2021-02-17 NOTE — Progress Notes (Signed)
Pt reports having some diarrhea on occasion, relieved by imodium. Pt also reports some reflux.   Pt states having neck and left shoulder pain that started yesterday.

## 2021-02-18 LAB — SAMPLE TO BLOOD BANK

## 2021-02-27 ENCOUNTER — Other Ambulatory Visit: Payer: Self-pay | Admitting: Family Medicine

## 2021-02-27 NOTE — Telephone Encounter (Signed)
Please schedule annual exam and refill until then  

## 2021-02-27 NOTE — Telephone Encounter (Signed)
No recent or future appts, and no TSH labs in almost a year, please advise

## 2021-02-27 NOTE — Telephone Encounter (Signed)
Levothyroxine refilled once and will route to [facility] to f/u regarding appt

## 2021-03-09 ENCOUNTER — Other Ambulatory Visit: Payer: Self-pay | Admitting: *Deleted

## 2021-03-09 ENCOUNTER — Other Ambulatory Visit: Payer: Self-pay | Admitting: Internal Medicine

## 2021-03-09 ENCOUNTER — Ambulatory Visit: Payer: PPO

## 2021-03-09 DIAGNOSIS — D4620 Refractory anemia with excess of blasts, unspecified: Secondary | ICD-10-CM

## 2021-03-10 ENCOUNTER — Encounter: Payer: Self-pay | Admitting: Internal Medicine

## 2021-03-10 MED ORDER — LENALIDOMIDE 10 MG PO CAPS: 10.0000 mg | ORAL_CAPSULE | Freq: Every day | ORAL | 0 refills | Status: DC

## 2021-03-10 NOTE — Telephone Encounter (Signed)
LVM for patient to call back. ?

## 2021-03-12 ENCOUNTER — Telehealth: Payer: Self-pay | Admitting: *Deleted

## 2021-03-12 NOTE — Telephone Encounter (Signed)
Spoke with Timmy at [facility]. Ok to hold off on additional 3 tabs of Revlimid and pt can resume her normal schedule next week

## 2021-03-12 NOTE — Telephone Encounter (Signed)
I spoke with patient. She was short 3 tablets previously due to irregular schedule. She is scheduled to restart her cycle next Thursday. Ok to miss 3 days per Dr. Jacinto Reap. Patient reassured that Dr. B is ok with this. Pt gave verbal understanding of plan. Will touch base with [facility] to further discuss.

## 2021-03-12 NOTE — Telephone Encounter (Signed)
Call from pharmacy requesting refill for # 3 caps of Revlimid to complete her cycle

## 2021-03-12 NOTE — Telephone Encounter (Signed)
I sent a script 2 days ago.

## 2021-03-17 ENCOUNTER — Encounter: Payer: Self-pay | Admitting: Internal Medicine

## 2021-03-17 ENCOUNTER — Inpatient Hospital Stay: Payer: PPO

## 2021-03-17 ENCOUNTER — Inpatient Hospital Stay: Payer: PPO | Attending: Internal Medicine

## 2021-03-17 ENCOUNTER — Inpatient Hospital Stay (HOSPITAL_BASED_OUTPATIENT_CLINIC_OR_DEPARTMENT_OTHER): Payer: PPO | Admitting: Internal Medicine

## 2021-03-17 DIAGNOSIS — Z79899 Other long term (current) drug therapy: Secondary | ICD-10-CM | POA: Diagnosis not present

## 2021-03-17 DIAGNOSIS — R197 Diarrhea, unspecified: Secondary | ICD-10-CM | POA: Insufficient documentation

## 2021-03-17 DIAGNOSIS — D4620 Refractory anemia with excess of blasts, unspecified: Secondary | ICD-10-CM

## 2021-03-17 DIAGNOSIS — Z7982 Long term (current) use of aspirin: Secondary | ICD-10-CM | POA: Diagnosis not present

## 2021-03-17 DIAGNOSIS — D461 Refractory anemia with ring sideroblasts: Secondary | ICD-10-CM | POA: Insufficient documentation

## 2021-03-17 DIAGNOSIS — I50811 Acute right heart failure: Secondary | ICD-10-CM

## 2021-03-17 LAB — CBC WITH DIFFERENTIAL/PLATELET
Abs Immature Granulocytes: 0.01 10*3/uL (ref 0.00–0.07)
Basophils Absolute: 0.1 10*3/uL (ref 0.0–0.1)
Basophils Relative: 3 %
Eosinophils Absolute: 0.4 10*3/uL (ref 0.0–0.5)
Eosinophils Relative: 13 %
HCT: 30.1 % — ABNORMAL LOW (ref 36.0–46.0)
Hemoglobin: 10.2 g/dL — ABNORMAL LOW (ref 12.0–15.0)
Immature Granulocytes: 0 %
Lymphocytes Relative: 34 %
Lymphs Abs: 1.1 10*3/uL (ref 0.7–4.0)
MCH: 36 pg — ABNORMAL HIGH (ref 26.0–34.0)
MCHC: 33.9 g/dL (ref 30.0–36.0)
MCV: 106.4 fL — ABNORMAL HIGH (ref 80.0–100.0)
Monocytes Absolute: 0.4 10*3/uL (ref 0.1–1.0)
Monocytes Relative: 12 %
Neutro Abs: 1.3 10*3/uL — ABNORMAL LOW (ref 1.7–7.7)
Neutrophils Relative %: 38 %
Platelets: 176 10*3/uL (ref 150–400)
RBC: 2.83 MIL/uL — ABNORMAL LOW (ref 3.87–5.11)
RDW: 16.2 % — ABNORMAL HIGH (ref 11.5–15.5)
WBC: 3.3 10*3/uL — ABNORMAL LOW (ref 4.0–10.5)
nRBC: 0 % (ref 0.0–0.2)

## 2021-03-17 LAB — COMPREHENSIVE METABOLIC PANEL
ALT: 19 U/L (ref 0–44)
AST: 22 U/L (ref 15–41)
Albumin: 4.1 g/dL (ref 3.5–5.0)
Alkaline Phosphatase: 51 U/L (ref 38–126)
Anion gap: 10 (ref 5–15)
BUN: 15 mg/dL (ref 8–23)
CO2: 26 mmol/L (ref 22–32)
Calcium: 9 mg/dL (ref 8.9–10.3)
Chloride: 102 mmol/L (ref 98–111)
Creatinine, Ser: 0.9 mg/dL (ref 0.44–1.00)
GFR, Estimated: >60 mL/min (ref >60)
Glucose, Bld: 124 mg/dL — ABNORMAL HIGH (ref 70–99)
Potassium: 3.4 mmol/L — ABNORMAL LOW (ref 3.5–5.1)
Sodium: 138 mmol/L (ref 135–145)
Total Bilirubin: 1.1 mg/dL (ref 0.3–1.2)
Total Protein: 7.9 g/dL (ref 6.5–8.1)

## 2021-03-17 LAB — VITAMIN D 25 HYDROXY (VIT D DEFICIENCY, FRACTURES): Vit D, 25-Hydroxy: 19.32 ng/mL — ABNORMAL LOW (ref 30–100)

## 2021-03-17 LAB — LACTATE DEHYDROGENASE: LDH: 95 U/L — ABNORMAL LOW (ref 98–192)

## 2021-03-17 NOTE — Progress Notes (Signed)
[place] CONSULT NOTE  Patient Care Team: [name], [name], MD as PCP - General Rogue Bussing, [name], MD as Consulting Physician (Hematology and Oncology)  CHIEF COMPLAINTS/PURPOSE OF CONSULTATION: MDS   Oncology History Overview Note   # July 2020-myelodysplastic syndrome with ringed sideroblasts-Normal karyotype; no blasts [IPSS R-Very low risk ~median survival 8.3 years]; iron studies B34 folic acid myeloma panel normal; pyridoxine levels/copper/zinc-WNL. Erythropoietin levels-60. 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  female history of low-grade MDS anemia with ring sideroblasts currently on Aranesp/Revlimid is here for follow-up.  Patient complains of mild diarrhea 1-2 loose stools a day.  Intermittent dyspepsia.  Denies any worsening headaches nausea vomiting or fever.  No recent infections.  Review of Systems  Constitutional:  Positive for malaise/fatigue. Negative for chills, diaphoresis and fever.  HENT:  Negative for nosebleeds and sore throat.   Eyes:  Negative for double vision.  Respiratory:  Negative for cough, hemoptysis, sputum production, shortness of breath and wheezing.   Cardiovascular:  Negative for chest pain and  orthopnea.  Gastrointestinal:  Negative for abdominal pain, blood in stool, constipation, diarrhea, heartburn, melena and vomiting.  Genitourinary:  Negative for dysuria, frequency and urgency.  Musculoskeletal:  Positive for back pain and joint pain.  Skin: Negative.  Negative for itching and rash.  Neurological:  Negative for dizziness, tingling, focal weakness, weakness and headaches.  Endo/Heme/Allergies:  Does not bruise/bleed easily.  Psychiatric/Behavioral:  Negative for depression. The patient is not nervous/anxious and does not have insomnia.    MEDICAL HISTORY:  Past Medical History:  Diagnosis Date   Allergic rhinitis, cause unspecified    Anemia, unspecified    Carpal tunnel syndrome    Cystitis, unspecified    Family history of osteoporosis    PONV (postoperative nausea and vomiting)    Postnasal drip    Syncope and collapse     SURGICAL HISTORY: Past Surgical History:  Procedure Laterality Date   CARPAL TUNNEL RELEASE     COLONOSCOPY  2005   COLONOSCOPY WITH PROPOFOL N/A 07/31/2015   Procedure: COLONOSCOPY WITH PROPOFOL;  Surgeon: RRonald Lobo MD;  Location: [facility];  Service: Endoscopy;  Laterality: N/A;   DIAGNOSTIC LAPAROSCOPY     dx lack of pregnancy    EYE SURGERY     lasik, cataract, prk,yag procedure    SOCIAL HISTORY: Social History   Socioeconomic History   Marital status: Married    Spouse name: Not on file   Number of children: Not on file   Years of education: Not on file   Highest education level: Not on file  Occupational History   Not on file  Tobacco Use   Smoking status: Never   Smokeless tobacco: Never  Vaping Use   Vaping Use: Never used  Substance and Sexual Activity   Alcohol use: Yes  Alcohol/week: 0.0 standard drinks    Comment: Rare   Drug use: No   Sexual activity: Not Currently  Other Topics Concern   Not on file  Social History Narrative   No regular exercise      Drinks lots of [employer]         Social  Determinants of Health   Financial Resource Strain: Not on file  Food Insecurity: Not on file  Transportation Needs: Not on file  Physical Activity: Not on file  Stress: Not on file  Social Connections: Not on file  Intimate Partner Violence: Not on file    FAMILY HISTORY: Family History  Problem Relation Age of Onset   Osteoporosis Mother    Stroke Mother    Coronary artery disease Father    Stroke Father 71   Diabetes Other        Aunts and uncles   Breast cancer Paternal Aunt    Breast cancer Maternal Aunt    Arthritis/Rheumatoid Child    Breast cancer Cousin     ALLERGIES:  is allergic to codeine.  MEDICATIONS:  Current Outpatient Medications  Medication Sig Dispense Refill   acetaminophen (TYLENOL) 500 MG tablet Take 500-1,000 mg by mouth every 6 (six) hours as needed for mild pain or headache.     aspirin EC 81 MG tablet Take 81 mg by mouth at bedtime. Swallow whole.      BIOTIN PO Take 1 tablet by mouth 3 (three) times a week.      Calcium Carb-Cholecalciferol (CALCIUM 600+D3 PO) Take 1 tablet by mouth 3 (three) times a week.      Darbepoetin Alfa (ARANESP) 500 MCG/ML SOSY injection Inject 500 mcg into the skin See admin instructions. Inject 500 mcg into the skin every week     lenalidomide (REVLIMID) 10 MG capsule Take 1 capsule (10 mg total) by mouth daily. 3 weeks-On and 1 week-OFF. 21 capsule 0   levothyroxine (SYNTHROID) 25 MCG tablet TAKE 1 TABLET BY MOUTH ONCE A DAY BEFOREBREAKFAST. 90 tablet 0   loperamide (IMODIUM) 2 MG capsule Take by mouth as needed for diarrhea or loose stools.     ondansetron (ZOFRAN ODT) 8 MG disintegrating tablet Take 1 tablet (8 mg total) by mouth every 8 (eight) hours as needed for nausea or vomiting. (Patient not taking: Reported on 03/17/2021) 30 tablet 0   No current facility-administered medications for this visit.      PHYSICAL EXAMINATION:   Vitals:   03/17/21 1100  BP: (!) 119/55  Pulse: (!) 57  Resp: 20  Temp: 98.6  F (37 C)  SpO2: 100%   Filed Weights   03/17/21 1100  Weight: 149 lb (67.6 kg)    Physical Exam Constitutional:      Comments: Walk independently.  Accompanied by husband.  HENT:     Head: Normocephalic and atraumatic.     Mouth/Throat:     Pharynx: No oropharyngeal exudate.  Eyes:     Pupils: Pupils are equal, round, and reactive to light.  Cardiovascular:     Rate and Rhythm: Normal rate and regular rhythm.     Pulses: Normal pulses.     Heart sounds: Normal heart sounds.  Pulmonary:     Effort: Pulmonary effort is normal. No respiratory distress.     Breath sounds: Normal breath sounds. No wheezing.  Abdominal:     General: Bowel sounds are normal. There is no distension.     Palpations: Abdomen is soft. There is no  mass.     Tenderness: no abdominal tenderness There is no guarding or rebound.  Musculoskeletal:        General: No tenderness. Normal range of motion.     Cervical back: Normal range of motion and neck supple.  Skin:    General: Skin is warm.  Neurological:     Mental Status: She is alert and oriented to person, place, and time.  Psychiatric:        Mood and Affect: Affect normal.    LABORATORY DATA:  I have reviewed the data as listed Lab Results  Component Value Date   WBC 3.3 (L) 03/17/2021   HGB 10.2 (L) 03/17/2021   HCT 30.1 (L) 03/17/2021   MCV 106.4 (H) 03/17/2021   PLT 176 03/17/2021   Recent Labs    05/20/20 0839 05/22/20 0829 06/03/20 1351 06/17/20 1516 06/24/20 1504 07/01/20 1301 01/21/21 1052 02/17/21 0922 03/17/21 1044  NA 127*   < > 137 138 140   < > 137 137 138  K 3.8   < > 4.3 3.6 3.5   < > 3.7 3.7 3.4*  CL 96*   < > 104 104 105   < > 102 102 102  CO2 21*   < > '26 27 27   ' < > '25 25 26  ' GLUCOSE 114*   < > 113* 134* 101*   < > 121* 125* 124*  BUN 29*   < > '15 13 8   ' < > '17 14 15  ' CREATININE 1.23*   < > 1.10* 0.95 1.08*   < > 1.02* 0.93 0.90  CALCIUM 8.2*   < > 8.2* 8.5* 8.6*   < > 9.0 9.1 9.0  GFRNONAA 45*   < > 51*  >60 52*   < > 59* >60 >60  GFRAA 52*   < > 59* >60 >60  --   --   --   --   PROT 7.6  --  7.3 7.7 7.7   < > 8.1 8.0 7.9  ALBUMIN 3.3*  --  3.5 3.9 4.1   < > 4.1 4.3 4.1  AST 86*  --  '19 17 18   ' < > '23 21 22  ' ALT 57*  --  '20 15 15   ' < > '19 19 19  ' ALKPHOS 32*  --  43 51 50   < > 45 52 51  BILITOT 1.7*  --  1.6* 1.0 1.1   < > 0.9 1.2 1.1  BILIDIR 0.5*  --   --   --   --   --   --   --   --   IBILI 1.2*  --   --   --   --   --   --   --   --    < > = values in this interval not displayed.     No results found.  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 POSITIVE  For SF3B1; R-IPSS-low risk. Poor response to Aranesp; currently on Aranesp 500 mcg weekly subcu injections; and  lenalidomide 10 mg  3w-ON & 1 w-OFF-tolerating fairly well except for mild GI effects/mild neutropenia.    # Hemoglobin is 10.3; HOLD Aranesp.  Monitor closely. Continue Revlimid 20m; days-1-21; 7 days OFF. STABLE.   # Diarrhea/dyspepsis-G-1-2; sec to revlimid- monitor for now; discussed regarding use of cholestyramine/decrease fat intake.  Patient will try dietary modification follow-up.  #  DISPOSITION:  # HOLD Aranesp. # referral to [facility] re: chronic diarrhea sec to revlimid/ dietary modifications # in 4 weeks- cbc/possible aranesp; MD; labs- cbc/Cmp;LDH;  possible; Aranesp SQ- Dr.B  All questions were answered. The patient knows to call the clinic with any problems, questions or concerns.   Cammie Sickle, MD 03/17/2021 1:52 PM

## 2021-03-17 NOTE — Progress Notes (Signed)
Patient states she is having stomach issues and what's not if there is anything she can do as far as the up set stomach.

## 2021-03-17 NOTE — Assessment & Plan Note (Addendum)
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 Poor response to Aranesp; currently on Aranesp 500 mcg weekly subcu injections; and  lenalidomide 10 mg  3w-ON & 1 w-OFF-tolerating fairly well except for mild GI effects/mild neutropenia.    # Hemoglobin is 10.3; HOLD Aranesp.  Monitor closely. Continue Revlimid 38m; days-1-21; 7 days OFF. STABLE.   # Diarrhea/dyspepsis-G-1-2; sec to revlimid- monitor for now; discussed regarding use of cholestyramine/decrease fat intake.  Patient will try dietary modification follow-up.  # DISPOSITION:  # HOLD Aranesp. # referral to [facility] re: chronic diarrhea sec to revlimid/ dietary modifications # in 4 weeks- cbc/possible aranesp; MD; labs- cbc/Cmp;LDH;  possible; Aranesp SQ- Dr.B

## 2021-03-18 ENCOUNTER — Other Ambulatory Visit: Payer: Self-pay | Admitting: Internal Medicine

## 2021-03-18 ENCOUNTER — Telehealth: Payer: Self-pay | Admitting: *Deleted

## 2021-03-18 MED ORDER — ERGOCALCIFEROL 1.25 MG (50000 UT) PO CAPS: 50000.0000 [IU] | ORAL_CAPSULE | ORAL | 1 refills | Status: DC

## 2021-03-18 NOTE — Telephone Encounter (Signed)
Please inform patient that her vitamin D is low at 19.  Which would explain her body aches.  Recommend vitamin D 50,000 units weekly.  Can hold off any calcium plus vitamin D if she is taking currently.  I will send a prescription.  Please document. Thanks

## 2021-03-18 NOTE — Telephone Encounter (Signed)
Spoke with patient. Explained that vit d level is low.  Patient gave verbal understanding to hold off on Caltrate +D and to start taking the vitamin d weekly 50,000 unit tablet. Pt made aware that Dr. Jacinto Reap sent the vit d rx to her pharmacy.

## 2021-03-25 ENCOUNTER — Inpatient Hospital Stay: Payer: PPO

## 2021-03-25 NOTE — Progress Notes (Signed)
Nutrition  Patient did not show up for nutrition visit today.  Message sent to scheduling to offer another appointment and provider informed.  Mirha Brucato B. Zenia Resides, [place], [place] Registered Dietitian [phone] (mobile)

## 2021-03-27 ENCOUNTER — Telehealth: Payer: Self-pay

## 2021-03-27 NOTE — Telephone Encounter (Signed)
Nutrition  Patient sent RD a text message in regards to missing 6/29 appointment.   RD called and spoke with patient.  Patient thought appointment was to be phone call but on RD's end it was schedule for patient to come into clinic.  RD apologized for confusion.  Nutrition appointment scheduled for 7/20 after MD appointment.   Hallie Ertl B. Zenia Resides, [place], [place] Registered Dietitian [phone] (mobile)

## 2021-04-01 ENCOUNTER — Telehealth: Payer: Self-pay

## 2021-04-01 ENCOUNTER — Telehealth: Payer: Self-pay | Admitting: Internal Medicine

## 2021-04-01 DIAGNOSIS — Z20822 Contact with and (suspected) exposure to covid-19: Secondary | ICD-10-CM | POA: Diagnosis not present

## 2021-04-01 DIAGNOSIS — Z03818 Encounter for observation for suspected exposure to other biological agents ruled out: Secondary | ICD-10-CM | POA: Diagnosis not present

## 2021-04-01 NOTE — Telephone Encounter (Signed)
If she refuses ER then needs virtual visit with first available.  Her oncologist needs to know re: advice for treatment- infusion vs oral tx (best for her in the setting of her cancer)   Cc to Dr Starleen Arms as well

## 2021-04-01 NOTE — Telephone Encounter (Signed)
Pt left v/m that pt was tested + covid and since pt is [place] pt she wonders if she would be candidate for infusion. Pts last visit was 03/10/20 for annual exam. I spoke with pt; pt tested + for covid with home test and then went to [facility] and had rapid and PCR test done and pt said both test were positive; pt started with symptoms 03/31/21; pt having head congestion, runny nose,dry cough, fever has averaged 100; pt has not taken med to lower fever. Pt having chills slight body aches; and H/A is pain level 5 - 6. No SOB and no diarrhea or vomiting;  pt taking chemo pill three weeks on and one wk off. Presently pt is on beginning of 2nd wk of chemo pill. No loss of taste or smell; scratchy and sore throat; pt said it does hurt to swallow. Pt found out that her pastor tested + for covid on 03/30/21 or 03/31/21 and pt was at church on 03/29/21; pt did not wear a mask or social distance. Pt said she does not feel good but pt is not in any distress. 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 Pt said she has had a bad experience at ED previously and does not want to go to ED unless absolutely necessary and she does not feel at this point it is absolutely necessary. Pt request cb from someone in AM after Dr Glori Bickers reviews this note to see what DR Glori Bickers would advise. ED precautions given and pt voiced understanding. Pt will wait for cb. Sending note to Dr Glori Bickers as PCP and Dr Danise Mina and [place] CMA. Advised pt to self quarantine, drink plenty of liquids, rest and take tylenol for fever and body aches. Pt voiced understanding.

## 2021-04-01 NOTE — Telephone Encounter (Signed)
On 7/06-called patient to check.  Temperature 100-1 01; did not check her blood pressure/asked to get a pulse oximeter.  Mild cough; upper respiratory congestion.  No shortness of breath.   Drink plenty of fluids/take Tylenol.  L/J-please follow-up early morning tomorrow.  Thanks GB

## 2021-04-02 ENCOUNTER — Telehealth (HOSPITAL_BASED_OUTPATIENT_CLINIC_OR_DEPARTMENT_OTHER): Payer: PPO | Admitting: Hospice and Palliative Medicine

## 2021-04-02 ENCOUNTER — Other Ambulatory Visit: Payer: Self-pay

## 2021-04-02 ENCOUNTER — Encounter: Payer: Self-pay | Admitting: Hospice and Palliative Medicine

## 2021-04-02 DIAGNOSIS — U071 COVID-19: Secondary | ICD-10-CM | POA: Diagnosis not present

## 2021-04-02 MED ORDER — NIRMATRELVIR/RITONAVIR (PAXLOVID)TABLET: 3.0000 | ORAL_TABLET | Freq: Two times a day (BID) | ORAL | 0 refills | Status: AC

## 2021-04-02 MED ORDER — NIRMATRELVIR/RITONAVIR (PAXLOVID)TABLET: 3.0000 | ORAL_TABLET | Freq: Two times a day (BID) | ORAL | 0 refills | Status: DC

## 2021-04-02 NOTE — Telephone Encounter (Signed)
Called pt and let her know oncologist want's to do virtual visit with her to discuss treatment options. While on the phone with pt oncologist office called to schedule appt.

## 2021-04-02 NOTE — Progress Notes (Signed)
Virtual Visit via Video Note  I connected with Carla [name] on 04/02/21 at 10:30 AM EDT by a video enabled telemedicine application and verified that I am speaking with the correct person using two identifiers.  Location: Patient: Home Provider: Clinic   I discussed the limitations of evaluation and management by telemedicine and the availability of in person appointments. The patient expressed understanding and agreed to proceed.  History of Present Illness: 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    On 7/5 -patient developed rhinorrhea, sneezing, and nonproductive cough.  She had low-grade fever with T-max of 100.0.  She took a home COVID test, which was positive.  On 7/6 patient had positive PCR at  [facility].  Patient spoke yesterday with Dr. Rogue Bussing who asked me to reach out to patient today to coordinate Paxil of the treatment.   Observations/Objective: Today, she denies shortness of breath or wheezing.  She continues to have rhinorrhea, sneezing, and nonproductive cough.  No fever presently.  Oral intake is adequate.  Patient denies nausea, vomiting, diarrhea, constipation.  No other distressing symptoms reported.  Patient is monitoring her SaO2 to her use of home oximetry. SaO2 was 96% today on RA.   Assessment and Plan: COVID -discussed with Dr. Rogue Bussing and given high risk of MDS on Revlimid, will initiate treatment with Paxlovid. GFR >60 during recent labs. Medications reconciled with patient and nurse and confirmed by me. No significant drug-drug interactions checked with calculator and confirmed by [facility] pharmacist Nuala Alpha, PharmD). Discussed symptomatic care with COVID including importance of maintaining hydration. She was instructed to hold Revlimid until further direction/improvement in COVID symptoms. Discussed importance of monitoring SaO2  and triggers that would require her to seek care in clinic or ER including but not limited to shortness of breath, hypoxia, or worsening symptoms.   Case and plan discussed with Dr. Rogue Bussing  Follow Up Instructions: [facility] as needed. Follow up with Dr. Rogue Bussing in 3 weeks   I discussed the assessment and treatment plan with the patient. The patient was provided an opportunity to ask questions and all were answered. The patient agreed with the plan and demonstrated an understanding of the instructions.   The patient was advised to call back or seek an in-person evaluation if the symptoms worsen or if the condition fails to improve as anticipated.  I provided 15 minutes of non-face-to-face time during this encounter.   Irean Hong, NP

## 2021-04-02 NOTE — Addendum Note (Signed)
Addended by: Altha Harm R on: 04/02/2021 01:03 PM   Modules accepted: Orders

## 2021-04-02 NOTE — Progress Notes (Signed)
Patient + covid19. She is running a temp of 100-101 this morning. Patient denies any nausea vomiting or diarrhea. Patient's symptoms of head congestion and running nose and cough. Denies shortness of breath.

## 2021-04-03 ENCOUNTER — Telehealth: Payer: Self-pay

## 2021-04-03 ENCOUNTER — Ambulatory Visit: Payer: PPO

## 2021-04-03 NOTE — Telephone Encounter (Signed)
Called patient 4 times trying to complete her AWV. Patient never answered. Kept going straight to voicemail. Left message on voicemail notifying patient I called and to call and reschedule at her her convenience. Appointment cancelled.

## 2021-04-15 ENCOUNTER — Inpatient Hospital Stay: Payer: PPO

## 2021-04-15 ENCOUNTER — Inpatient Hospital Stay (HOSPITAL_BASED_OUTPATIENT_CLINIC_OR_DEPARTMENT_OTHER): Payer: PPO | Admitting: Internal Medicine

## 2021-04-15 ENCOUNTER — Other Ambulatory Visit: Payer: Self-pay

## 2021-04-15 ENCOUNTER — Inpatient Hospital Stay: Payer: PPO | Attending: Internal Medicine

## 2021-04-15 ENCOUNTER — Encounter: Payer: Self-pay | Admitting: Internal Medicine

## 2021-04-15 DIAGNOSIS — D4620 Refractory anemia with excess of blasts, unspecified: Secondary | ICD-10-CM

## 2021-04-15 DIAGNOSIS — I50811 Acute right heart failure: Secondary | ICD-10-CM

## 2021-04-15 DIAGNOSIS — D649 Anemia, unspecified: Secondary | ICD-10-CM

## 2021-04-15 DIAGNOSIS — Z79899 Other long term (current) drug therapy: Secondary | ICD-10-CM | POA: Diagnosis not present

## 2021-04-15 LAB — COMPREHENSIVE METABOLIC PANEL
ALT: 16 U/L (ref 0–44)
AST: 21 U/L (ref 15–41)
Albumin: 4.1 g/dL (ref 3.5–5.0)
Alkaline Phosphatase: 48 U/L (ref 38–126)
Anion gap: 9 (ref 5–15)
BUN: 12 mg/dL (ref 8–23)
CO2: 26 mmol/L (ref 22–32)
Calcium: 8.7 mg/dL — ABNORMAL LOW (ref 8.9–10.3)
Chloride: 103 mmol/L (ref 98–111)
Creatinine, Ser: 0.93 mg/dL (ref 0.44–1.00)
GFR, Estimated: >60 mL/min (ref >60)
Glucose, Bld: 124 mg/dL — ABNORMAL HIGH (ref 70–99)
Potassium: 3.7 mmol/L (ref 3.5–5.1)
Sodium: 138 mmol/L (ref 135–145)
Total Bilirubin: 0.8 mg/dL (ref 0.3–1.2)
Total Protein: 7.6 g/dL (ref 6.5–8.1)

## 2021-04-15 LAB — CBC WITH DIFFERENTIAL/PLATELET
Abs Immature Granulocytes: 0.02 10*3/uL (ref 0.00–0.07)
Basophils Absolute: 0.1 10*3/uL (ref 0.0–0.1)
Basophils Relative: 2 %
Eosinophils Absolute: 0.3 10*3/uL (ref 0.0–0.5)
Eosinophils Relative: 10 %
HCT: 29.1 % — ABNORMAL LOW (ref 36.0–46.0)
Hemoglobin: 9.7 g/dL — ABNORMAL LOW (ref 12.0–15.0)
Immature Granulocytes: 1 %
Lymphocytes Relative: 30 %
Lymphs Abs: 1 10*3/uL (ref 0.7–4.0)
MCH: 35.3 pg — ABNORMAL HIGH (ref 26.0–34.0)
MCHC: 33.3 g/dL (ref 30.0–36.0)
MCV: 105.8 fL — ABNORMAL HIGH (ref 80.0–100.0)
Monocytes Absolute: 0.4 10*3/uL (ref 0.1–1.0)
Monocytes Relative: 11 %
Neutro Abs: 1.6 10*3/uL — ABNORMAL LOW (ref 1.7–7.7)
Neutrophils Relative %: 46 %
Platelets: 154 10*3/uL (ref 150–400)
RBC: 2.75 MIL/uL — ABNORMAL LOW (ref 3.87–5.11)
RDW: 15.5 % (ref 11.5–15.5)
WBC: 3.4 10*3/uL — ABNORMAL LOW (ref 4.0–10.5)
nRBC: 0 % (ref 0.0–0.2)

## 2021-04-15 LAB — LACTATE DEHYDROGENASE: LDH: 92 U/L — ABNORMAL LOW (ref 98–192)

## 2021-04-15 MED ORDER — LENALIDOMIDE 10 MG PO CAPS: 10.0000 mg | ORAL_CAPSULE | Freq: Every day | ORAL | 0 refills | Status: DC

## 2021-04-15 MED ORDER — DARBEPOETIN ALFA 500 MCG/ML IJ SOSY
500.0000 ug | PREFILLED_SYRINGE | Freq: Once | INTRAMUSCULAR | Status: AC
  Administered 2021-04-15: 500 ug via SUBCUTANEOUS
  Filled 2021-04-15: qty 1

## 2021-04-15 NOTE — Progress Notes (Signed)
[facility] CONSULT NOTE  Patient Care Team: [name], [name], MD as PCP - General Rogue Bussing, [name], MD as Consulting Physician (Hematology and Oncology)  CHIEF COMPLAINTS/PURPOSE OF CONSULTATION: MDS   Oncology History Overview Note   # July 2020-myelodysplastic syndrome with ringed sideroblasts-Normal karyotype; no blasts [IPSS R-Very low risk ~median survival 8.3 years]; iron studies O06 folic acid myeloma panel normal; pyridoxine levels/copper/zinc-WNL. Erythropoietin levels-60. 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  female history of low-grade MDS anemia with ring sideroblasts currently on Aranesp/Revlimid is here for follow-up.  Patient in the interim was diagnosed with COVID.  Patient received Paxlovid-outpatient basis.  Symptoms improved mild scratchy throat.  Otherwise no fever no chills no cough.  Chronic mild diarrhea.  No chest pain or shortness of breath.  Review of Systems  Constitutional:  Positive for malaise/fatigue. Negative for chills, diaphoresis and fever.  HENT:  Negative for nosebleeds and sore throat.   Eyes:  Negative for double vision.  Respiratory:  Negative for cough, hemoptysis, sputum production, shortness of  breath and wheezing.   Cardiovascular:  Negative for chest pain and orthopnea.  Gastrointestinal:  Negative for abdominal pain, blood in stool, constipation, diarrhea, heartburn, melena and vomiting.  Genitourinary:  Negative for dysuria, frequency and urgency.  Musculoskeletal:  Positive for back pain and joint pain.  Skin: Negative.  Negative for itching and rash.  Neurological:  Negative for dizziness, tingling, focal weakness, weakness and headaches.  Endo/Heme/Allergies:  Does not bruise/bleed easily.  Psychiatric/Behavioral:  Negative for depression. The patient is not nervous/anxious and does not have insomnia.    MEDICAL HISTORY:  Past Medical History:  Diagnosis Date   Allergic rhinitis, cause unspecified    Anemia, unspecified    Carpal tunnel syndrome    Cystitis, unspecified    Family history of osteoporosis    PONV (postoperative nausea and vomiting)    Postnasal drip    Syncope and collapse     SURGICAL HISTORY: Past Surgical History:  Procedure Laterality Date   CARPAL TUNNEL RELEASE     COLONOSCOPY  2005   COLONOSCOPY WITH PROPOFOL N/A 07/31/2015   Procedure: COLONOSCOPY WITH PROPOFOL;  Surgeon: RRonald Lobo MD;  Location: [facility];  Service: Endoscopy;  Laterality: N/A;   DIAGNOSTIC LAPAROSCOPY     dx lack of pregnancy    EYE SURGERY     lasik, cataract, prk,yag procedure    SOCIAL HISTORY: Social History   Socioeconomic History   Marital status: Married    Spouse name: Not on file   Number of children: Not on file   Years of education: Not on file   Highest education level: Not on file  Occupational History   Not on file  Tobacco Use   Smoking status: Never   Smokeless tobacco: Never  Vaping Use   Vaping Use:  Never used  Substance and Sexual Activity   Alcohol use: Yes    Alcohol/week: 0.0 standard drinks    Comment: Rare   Drug use: No   Sexual activity: Not Currently  Other Topics Concern   Not on file  Social History Narrative    No regular exercise      Drinks lots of [employer]         Social Determinants of Health   Financial Resource Strain: Not on file  Food Insecurity: Not on file  Transportation Needs: Not on file  Physical Activity: Not on file  Stress: Not on file  Social Connections: Not on file  Intimate Partner Violence: Not on file    FAMILY HISTORY: Family History  Problem Relation Age of Onset   Osteoporosis Mother    Stroke Mother    Coronary artery disease Father    Stroke Father 25   Diabetes Other        Aunts and uncles   Breast cancer Paternal Aunt    Breast cancer Maternal Aunt    Arthritis/Rheumatoid Child    Breast cancer Cousin     ALLERGIES:  is allergic to codeine.  MEDICATIONS:  Current Outpatient Medications  Medication Sig Dispense Refill   acetaminophen (TYLENOL) 500 MG tablet Take 500-1,000 mg by mouth every 6 (six) hours as needed for mild pain or headache.     aspirin EC 81 MG tablet Take 81 mg by mouth at bedtime. Swallow whole.      BIOTIN PO Take 1 tablet by mouth 3 (three) times a week.      Darbepoetin Alfa (ARANESP) 500 MCG/ML SOSY injection Inject 500 mcg into the skin See admin instructions. Inject 500 mcg into the skin every week     ergocalciferol (VITAMIN D2) 1.25 MG (50000 UT) capsule Take 1 capsule (50,000 Units total) by mouth once a week. 12 capsule 1   levothyroxine (SYNTHROID) 25 MCG tablet TAKE 1 TABLET BY MOUTH ONCE A DAY BEFOREBREAKFAST. 90 tablet 0   loperamide (IMODIUM) 2 MG capsule Take by mouth as needed for diarrhea or loose stools.     lenalidomide (REVLIMID) 10 MG capsule Take 1 capsule (10 mg total) by mouth daily. 3 weeks-On and 1 week-OFF. 21 capsule 0   ondansetron (ZOFRAN ODT) 8 MG disintegrating tablet Take 1 tablet (8 mg total) by mouth every 8 (eight) hours as needed for nausea or vomiting. (Patient not taking: No sig reported) 30 tablet 0   No current facility-administered medications for this visit.      PHYSICAL  EXAMINATION:   Vitals:   04/15/21 1120  BP: (!) 118/95  Pulse: 98  Temp: 98.1 F (36.7 C)   Filed Weights   04/15/21 1120  Weight: 147 lb 9.6 oz (67 kg)    Physical Exam Constitutional:      Comments: Walk independently.  Accompanied by husband.  HENT:     Head: Normocephalic and atraumatic.     Mouth/Throat:     Pharynx: No oropharyngeal exudate.  Eyes:     Pupils: Pupils are equal, round, and reactive to light.  Cardiovascular:     Rate and Rhythm: Normal rate and regular rhythm.     Pulses: Normal pulses.     Heart sounds: Normal heart sounds.  Pulmonary:     Effort: Pulmonary effort is normal. No respiratory distress.     Breath sounds: Normal breath sounds. No wheezing.  Abdominal:     General: Bowel sounds are normal. There  is no distension.     Palpations: Abdomen is soft. There is no mass.     Tenderness: no abdominal tenderness There is no guarding or rebound.  Musculoskeletal:        General: No tenderness. Normal range of motion.     Cervical back: Normal range of motion and neck supple.  Skin:    General: Skin is warm.  Neurological:     Mental Status: She is alert and oriented to person, place, and time.  Psychiatric:        Mood and Affect: Affect normal.    LABORATORY DATA:  I have reviewed the data as listed Lab Results  Component Value Date   WBC 3.4 (L) 04/15/2021   HGB 9.7 (L) 04/15/2021   HCT 29.1 (L) 04/15/2021   MCV 105.8 (H) 04/15/2021   PLT 154 04/15/2021   Recent Labs    05/20/20 0839 05/22/20 0829 06/03/20 1351 06/17/20 1516 06/24/20 1504 07/01/20 1301 02/17/21 0922 03/17/21 1044 04/15/21 1045  NA 127*   < > 137 138 140   < > 137 138 138  K 3.8   < > 4.3 3.6 3.5   < > 3.7 3.4* 3.7  CL 96*   < > 104 104 105   < > 102 102 103  CO2 21*   < > '26 27 27   ' < > '25 26 26  ' GLUCOSE 114*   < > 113* 134* 101*   < > 125* 124* 124*  BUN 29*   < > '15 13 8   ' < > '14 15 12  ' CREATININE 1.23*   < > 1.10* 0.95 1.08*   < > 0.93 0.90 0.93   CALCIUM 8.2*   < > 8.2* 8.5* 8.6*   < > 9.1 9.0 8.7*  GFRNONAA 45*   < > 51* >60 52*   < > >60 >60 >60  GFRAA 52*   < > 59* >60 >60  --   --   --   --   PROT 7.6  --  7.3 7.7 7.7   < > 8.0 7.9 7.6  ALBUMIN 3.3*  --  3.5 3.9 4.1   < > 4.3 4.1 4.1  AST 86*  --  '19 17 18   ' < > '21 22 21  ' ALT 57*  --  '20 15 15   ' < > '19 19 16  ' ALKPHOS 32*  --  43 51 50   < > 52 51 48  BILITOT 1.7*  --  1.6* 1.0 1.1   < > 1.2 1.1 0.8  BILIDIR 0.5*  --   --   --   --   --   --   --   --   IBILI 1.2*  --   --   --   --   --   --   --   --    < > = values in this interval not displayed.     No results found.  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 POSITIVE  For SF3B1; R-IPSS-low risk. Poor response to Aranesp; currently on Aranesp 500 mcg weekly subcu injections; and  lenalidomide 10 mg  3w-ON & 1 w-OFF-tolerating fairly well except for mild GI effects/mild neutropenia. STABLE   # Hemoglobin is 9.7; proceed with Aranesp.  Monitor closely. Continue Revlimid 53m; days-1-21; 7 days OFF. STABLE.   # COVID-19 infection" s/p Paxlovid- Improved.   #  Diarrhea/dyspepsis-G-1-2; sec to revlimid- monitor for now; discussed regarding use of cholestyramine/decrease fat intake- awaiting to see Carla [name] today.  # DISPOSITION:  # today Aranesp. # in 4 weeks- MD; labs- cbc/Cmp;LDH;  possible; Aranesp SQ- Dr.B  All questions were answered. The patient knows to call the clinic with any problems, questions or concerns.   Cammie Sickle, MD 04/15/2021 2:35 PM

## 2021-04-15 NOTE — Progress Notes (Signed)
Nutrition Assessment   Reason for Assessment:  Diet modification for diarrhea   ASSESSMENT:  70 year old female with myelodysplastic syndrome.  Past medical history of anemia, cystitis.  Recent Covid infection. Patient receiving revlimid  Met with patient following MD visit.  Patient reports loose stool periodically maybe 1 or 2 or 3 episodes weekly or every 10 days.  Pytel have multiple stools in 1 day but not daily.  If has more than 2 usually takes an imodium which helps.  Patient reports good appetite.     Medications: imodium, calcium carbonate, vit D, zofran   Labs: K 3.4, glucose 124   Anthropometrics:   Height: 64 inches Weight: 147 lb 9.6 oz today 6/21 149 lb 3/11 149 lb 1/28 151 lb BMI: 25 2 % weight loss in the last 6 months   NUTRITION DIAGNOSIS: Food and nutrition related knowledge deficit related to altered GI function (diarrhea) as evidenced by referral to RD   INTERVENTION:  Discussed foods to limit/avoid with diarrhea.  Handout provided Encouraged keep food log/diary and diarrhea episodes Contact information provided   MONITORING, EVALUATION, GOAL: weight trends, intake   Next Visit: no follow-up RD available as needed and encouraged patient to reach out if needed  Adarsh Mundorf B. Zenia Resides, [place], [place] Registered Dietitian [phone] (mobile)

## 2021-04-15 NOTE — Assessment & Plan Note (Addendum)
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 Poor response to Aranesp; currently on Aranesp 500 mcg weekly subcu injections; and  lenalidomide 10 mg  3w-ON & 1 w-OFF-tolerating fairly well except for mild GI effects/mild neutropenia. STABLE   # Hemoglobin is 9.7; proceed with Aranesp.  Monitor closely. Continue Revlimid 59m; days-1-21; 7 days OFF. STABLE.   # COVID-19 infection" s/p Paxlovid- Improved.   # Diarrhea/dyspepsis-G-1-2; sec to revlimid- monitor for now; discussed regarding use of cholestyramine/decrease fat intake- awaiting to see Joli today.  # DISPOSITION:  # today Aranesp. # in 4 weeks- MD; labs- cbc/Cmp;LDH;  possible; Aranesp SQ- Dr.B

## 2021-04-16 LAB — SAMPLE TO BLOOD BANK

## 2021-04-22 ENCOUNTER — Telehealth: Payer: Self-pay | Admitting: Family Medicine

## 2021-04-22 DIAGNOSIS — D696 Thrombocytopenia, unspecified: Secondary | ICD-10-CM

## 2021-04-22 DIAGNOSIS — R7989 Other specified abnormal findings of blood chemistry: Secondary | ICD-10-CM

## 2021-04-22 DIAGNOSIS — E039 Hypothyroidism, unspecified: Secondary | ICD-10-CM

## 2021-04-22 DIAGNOSIS — Z0000 Encounter for general adult medical examination without abnormal findings: Secondary | ICD-10-CM

## 2021-04-22 DIAGNOSIS — R739 Hyperglycemia, unspecified: Secondary | ICD-10-CM

## 2021-04-22 NOTE — Telephone Encounter (Signed)
-----   Message from Ellamae Sia sent at 04/06/2021  9:29 AM EDT ----- Regarding: Lab orders for Thursday, 7.28.22  AWV lab orders, please.

## 2021-04-23 ENCOUNTER — Other Ambulatory Visit: Payer: Self-pay

## 2021-04-23 ENCOUNTER — Other Ambulatory Visit (INDEPENDENT_AMBULATORY_CARE_PROVIDER_SITE_OTHER): Payer: PPO

## 2021-04-23 DIAGNOSIS — R7989 Other specified abnormal findings of blood chemistry: Secondary | ICD-10-CM

## 2021-04-23 DIAGNOSIS — R739 Hyperglycemia, unspecified: Secondary | ICD-10-CM | POA: Diagnosis not present

## 2021-04-23 DIAGNOSIS — Z0000 Encounter for general adult medical examination without abnormal findings: Secondary | ICD-10-CM

## 2021-04-23 DIAGNOSIS — D696 Thrombocytopenia, unspecified: Secondary | ICD-10-CM

## 2021-04-23 DIAGNOSIS — E039 Hypothyroidism, unspecified: Secondary | ICD-10-CM | POA: Diagnosis not present

## 2021-04-23 LAB — LIPID PANEL
Cholesterol: 145 mg/dL (ref 0–200)
HDL: 62.5 mg/dL (ref >39.00)
LDL Cholesterol: 70 mg/dL (ref 0–99)
NonHDL: 82.82
Total CHOL/HDL Ratio: 2
Triglycerides: 63 mg/dL (ref 0.0–149.0)
VLDL: 12.6 mg/dL (ref 0.0–40.0)

## 2021-04-23 LAB — CBC WITH DIFFERENTIAL/PLATELET
Basophils Absolute: 0.1 10*3/uL (ref 0.0–0.1)
Basophils Relative: 1.7 % (ref 0.0–3.0)
Eosinophils Absolute: 0.5 10*3/uL (ref 0.0–0.7)
Eosinophils Relative: 10.2 % — ABNORMAL HIGH (ref 0.0–5.0)
HCT: 30 % — ABNORMAL LOW (ref 36.0–46.0)
Hemoglobin: 10 g/dL — ABNORMAL LOW (ref 12.0–15.0)
Lymphocytes Relative: 30.4 % (ref 12.0–46.0)
Lymphs Abs: 1.5 10*3/uL (ref 0.7–4.0)
MCHC: 33.4 g/dL (ref 30.0–36.0)
MCV: 107.2 fl — ABNORMAL HIGH (ref 78.0–100.0)
Monocytes Absolute: 0.5 10*3/uL (ref 0.1–1.0)
Monocytes Relative: 10.4 % (ref 3.0–12.0)
Neutro Abs: 2.3 10*3/uL (ref 1.4–7.7)
Neutrophils Relative %: 47.3 % (ref 43.0–77.0)
Platelets: 208 10*3/uL (ref 150.0–400.0)
RBC: 2.8 Mil/uL — ABNORMAL LOW (ref 3.87–5.11)
RDW: 16.2 % — ABNORMAL HIGH (ref 11.5–15.5)
WBC: 4.9 10*3/uL (ref 4.0–10.5)

## 2021-04-23 LAB — COMPREHENSIVE METABOLIC PANEL
ALT: 14 U/L (ref 0–35)
AST: 15 U/L (ref 0–37)
Albumin: 4.1 g/dL (ref 3.5–5.2)
Alkaline Phosphatase: 50 U/L (ref 39–117)
BUN: 12 mg/dL (ref 6–23)
CO2: 27 mEq/L (ref 19–32)
Calcium: 9 mg/dL (ref 8.4–10.5)
Chloride: 104 mEq/L (ref 96–112)
Creatinine, Ser: 1.01 mg/dL (ref 0.40–1.20)
GFR: 56.24 mL/min — ABNORMAL LOW (ref >60.00)
Glucose, Bld: 90 mg/dL (ref 70–99)
Potassium: 3.7 mEq/L (ref 3.5–5.1)
Sodium: 139 mEq/L (ref 135–145)
Total Bilirubin: 0.7 mg/dL (ref 0.2–1.2)
Total Protein: 7.5 g/dL (ref 6.0–8.3)

## 2021-04-23 LAB — TSH: TSH: 3.25 u[IU]/mL (ref 0.35–5.50)

## 2021-04-23 LAB — HEMOGLOBIN A1C: Hgb A1c MFr Bld: 5.3 % (ref 4.6–6.5)

## 2021-04-27 ENCOUNTER — Ambulatory Visit (INDEPENDENT_AMBULATORY_CARE_PROVIDER_SITE_OTHER): Payer: PPO | Admitting: Family Medicine

## 2021-04-27 ENCOUNTER — Encounter: Payer: Self-pay | Admitting: Family Medicine

## 2021-04-27 ENCOUNTER — Other Ambulatory Visit: Payer: Self-pay

## 2021-04-27 VITALS — BP 124/66 | HR 68 | Temp 97.6°F | Ht 64.25 in | Wt 146.2 lb

## 2021-04-27 DIAGNOSIS — Z0000 Encounter for general adult medical examination without abnormal findings: Secondary | ICD-10-CM | POA: Diagnosis not present

## 2021-04-27 DIAGNOSIS — E039 Hypothyroidism, unspecified: Secondary | ICD-10-CM | POA: Diagnosis not present

## 2021-04-27 DIAGNOSIS — E559 Vitamin D deficiency, unspecified: Secondary | ICD-10-CM | POA: Insufficient documentation

## 2021-04-27 DIAGNOSIS — Z1231 Encounter for screening mammogram for malignant neoplasm of breast: Secondary | ICD-10-CM

## 2021-04-27 DIAGNOSIS — D4620 Refractory anemia with excess of blasts, unspecified: Secondary | ICD-10-CM | POA: Diagnosis not present

## 2021-04-27 DIAGNOSIS — R739 Hyperglycemia, unspecified: Secondary | ICD-10-CM | POA: Diagnosis not present

## 2021-04-27 NOTE — Assessment & Plan Note (Signed)
Ordered mammogram Pt will schedule  Enc self exams

## 2021-04-27 NOTE — Assessment & Plan Note (Signed)
Hypothyroidism  Pt has no clinical changes No change in energy level/ hair or skin/ edema and no tremor Lab Results  Component Value Date   TSH 3.25 04/23/2021    Plan to continue levothyroxine 25 mcg daily

## 2021-04-27 NOTE — Assessment & Plan Note (Signed)
Reviewed health habits including diet and exercise and skin cancer prevention Reviewed appropriate screening tests for age  Also reviewed health mt list, fam hx and immunization status , as well as social and family history   See HPI Labs reviewed  Colonoscopy utd Mammogram ordered  imms utd  dexa normal 2019 and no falls or fx taking D Adv directive utd No cognitive concerns Reviewed hearing screen-no action per pt req utd eye/vision care

## 2021-04-27 NOTE — Assessment & Plan Note (Signed)
Pt takes high dose weekly tx per oncology

## 2021-04-27 NOTE — Assessment & Plan Note (Signed)
Lab Results  Component Value Date   HGBA1C 5.3 04/23/2021   disc imp of low glycemic diet and wt loss to prevent DM2

## 2021-04-27 NOTE — Assessment & Plan Note (Signed)
Continues tx per oncology She struggles with side eff

## 2021-04-27 NOTE — Progress Notes (Signed)
Subjective:    Patient ID: Carla [name], female    DOB: 1951/04/09, 70 y.o.   MRN: [id number]  This visit occurred during the SARS-CoV-2 public health emergency.  Safety protocols were in place, including screening questions prior to the visit, additional usage of staff PPE, and extensive cleaning of exam room while observing appropriate contact time as indicated for disinfecting solutions.   HPI Pt presents for amw and health mt exam with rev of chronic problems   I have personally reviewed the Medicare Annual Wellness questionnaire and have noted 1. The patient's medical and social history 2. Their use of alcohol, tobacco or illicit drugs 3. Their current medications and supplements 4. The patient's functional ability including ADL's, fall risks, home safety risks and hearing or visual             impairment. 5. Diet and physical activities 6. Evidence for depression or mood disorders  The patients weight, height, BMI have been recorded in the chart and visual acuity is per eye clinic.  I have made referrals, counseling and provided education to the patient based review of the above and I have provided the pt with a written personalized care plan for preventive services. Reviewed and updated provider list, see scanned forms.  See scanned forms.  Routine anticipatory guidance given to patient.  See health maintenance. Colon cancer screening-colonoscopy 2016 Breast cancer screening  mammogram 7/21-needs ref  Self breast exam- no lumps  Flu vaccine-fall Covid imm Tetanus vaccine Tdap 9/12 - postponed medicare Pneumovax -up to date  Zoster vaccine- had shingrix  Dexa 5/19 -normal bmd [place]-none  Fractures- none  Supplements- vit D (onc px once weekly vit D)  Exercise - was doing good until the MDS- not as regular now [name] energetic with anemia    Advance directive- has it utd  Cognitive function addressed- see scanned forms- and if abnormal then additional documentation  follows.   She feels like she has brain fog since starting chemo  This is frustrating  No confusion-just short term memory  Can do finances and household calculations  Likes to read   PMH and SH reviewed  Meds, vitals, and allergies reviewed.   ROS: See HPI.  Otherwise negative.    Weight : Wt Readings from Last 3 Encounters:  04/27/21 146 lb 4 oz (66.3 kg)  04/15/21 147 lb 9.6 oz (67 kg)  03/17/21 149 lb (67.6 kg)   24.91 kg/m  Feels fair  Under tx for MDS  Frustrated- so healthy otherwise    Hearing/vision: Hearing Screening   '500Hz'$  '1000Hz'$  '2000Hz'$  '4000Hz'$   Right ear 40 40 40 0  Left ear 40 40 40 0  Vision Screening - Comments:: Eye exam 08/21 with  Dr. Sandra Cockayne, appt for this year has been scheduled   No hearing change that she notices  Utd eye/vision care    Care team: Telma Pyeatt-pcp Brahmanday-onc  Hypothyroidism  Pt has no clinical changes No change in energy level/ hair or skin/ edema and no tremor Lab Results  Component Value Date   TSH 3.25 04/23/2021    Levothyroxine 25 mcg daily   Elevated glucose in past Lab Results  Component Value Date   HGBA1C 5.3 04/23/2021   H/o MDS Sees oncology  Takes aranesp Also revlimid - lot of side eff/ diarrhea and dry skin  Stomach does not feel good   Lab Results  Component Value Date   WBC 4.9 04/23/2021   HGB 10.0 (L) 04/23/2021   HCT  30.0 (L) 04/23/2021   MCV 107.2 (H) 04/23/2021   PLT 208.0 04/23/2021  Was 9.7 hb 2 weeks ago   Lab Results  Component Value Date   CREATININE 1.01 04/23/2021   BUN 12 04/23/2021   NA 139 04/23/2021   K 3.7 04/23/2021   CL 104 04/23/2021   CO2 27 04/23/2021   Good fluid intake  Lab Results  Component Value Date   ALT 14 04/23/2021   AST 15 04/23/2021   ALKPHOS 50 04/23/2021   BILITOT 0.7 04/23/2021     Cholesterol Lab Results  Component Value Date   CHOL 145 04/23/2021   CHOL 135 03/05/2020   CHOL 159 03/02/2019   Lab Results  Component Value Date    HDL 62.50 04/23/2021   HDL 53.80 03/05/2020   HDL 55.10 03/02/2019   Lab Results  Component Value Date   LDLCALC 70 04/23/2021   LDLCALC 70 03/05/2020   LDLCALC 92 03/02/2019   Lab Results  Component Value Date   TRIG 63.0 04/23/2021   TRIG 54.0 03/05/2020   TRIG 61.0 03/02/2019   Lab Results  Component Value Date   CHOLHDL 2 04/23/2021   CHOLHDL 3 03/05/2020   CHOLHDL 3 03/02/2019   Lab Results  Component Value Date   LDLDIRECT 123.0 09/16/2009   LDLDIRECT 136.9 06/26/2008   Very good profile with diet   Patient Active Problem List   Diagnosis Date Noted   Medicare annual wellness visit, subsequent 04/27/2021   Encounter for screening mammogram for breast cancer 04/27/2021   Vitamin D deficiency 04/27/2021   Thrombocytopenia ([place]) 05/17/2020   Symptomatic anemia 05/17/2020   Goals of care, counseling/discussion 04/15/2020   MDS (myelodysplastic syndrome), 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[place]) 04/23/2019   Elevated ferritin 12/22/2017   Hypothyroidism 12/20/2017   Blood glucose elevated 12/20/2017   Normocytic anemia 12/19/2017   Initial Medicare annual wellness visit 10/10/2015   Estrogen deficiency 10/10/2015   Encounter for routine gynecological examination 10/08/2014   Other screening mammogram 07/05/2012   Routine gynecological examination 06/22/2011   Routine general medical examination at a health care facility 06/15/2011   ALLERGIC RHINITIS 06/26/2008   Past Medical History:  Diagnosis Date   Allergic rhinitis, cause unspecified    Anemia, unspecified    Carpal tunnel syndrome    Cystitis, unspecified    Family history of osteoporosis    PONV (postoperative nausea and vomiting)    Postnasal drip    Syncope and collapse    Past Surgical History:  Procedure Laterality Date   CARPAL TUNNEL RELEASE     COLONOSCOPY  2005   COLONOSCOPY WITH PROPOFOL N/A 07/31/2015   Procedure: COLONOSCOPY WITH PROPOFOL;  Surgeon: Ronald Lobo, MD;  Location: [facility];  Service:  Endoscopy;  Laterality: N/A;   DIAGNOSTIC LAPAROSCOPY     dx lack of pregnancy    EYE SURGERY     lasik, cataract, prk,yag procedure   Social History   Tobacco Use   Smoking status: Never   Smokeless tobacco: Never  Vaping Use   Vaping Use: Never used  Substance Use Topics   Alcohol use: Yes    Alcohol/week: 0.0 standard drinks    Comment: Rare   Drug use: No   Family History  Problem Relation Age of Onset   Osteoporosis Mother    Stroke Mother    Coronary artery disease Father    Stroke Father 33   Diabetes Other        Aunts and uncles   Breast  cancer Paternal Aunt    Breast cancer Maternal Aunt    Arthritis/Rheumatoid Child    Breast cancer Cousin    Allergies  Allergen Reactions   Codeine Nausea Only   Current Outpatient Medications on File Prior to Visit  Medication Sig Dispense Refill   acetaminophen (TYLENOL) 500 MG tablet Take 500-1,000 mg by mouth every 6 (six) hours as needed for mild pain or headache.     aspirin EC 81 MG tablet Take 81 mg by mouth at bedtime. Swallow whole.      BIOTIN PO Take 1 tablet by mouth 3 (three) times a week.      Darbepoetin Alfa (ARANESP) 500 MCG/ML SOSY injection Inject 500 mcg into the skin See admin instructions. Inject 500 mcg into the skin every week     ergocalciferol (VITAMIN D2) 1.25 MG (50000 UT) capsule Take 1 capsule (50,000 Units total) by mouth once a week. 12 capsule 1   lenalidomide (REVLIMID) 10 MG capsule Take 1 capsule (10 mg total) by mouth daily. 3 weeks-On and 1 week-OFF. 21 capsule 0   levothyroxine (SYNTHROID) 25 MCG tablet TAKE 1 TABLET BY MOUTH ONCE A DAY BEFOREBREAKFAST. 90 tablet 0   loperamide (IMODIUM) 2 MG capsule Take by mouth as needed for diarrhea or loose stools.     ondansetron (ZOFRAN ODT) 8 MG disintegrating tablet Take 1 tablet (8 mg total) by mouth every 8 (eight) hours as needed for nausea or vomiting. 30 tablet 0   No current facility-administered medications on file prior to visit.      Review of Systems  Constitutional:  Positive for fatigue. Negative for activity change, appetite change, fever and unexpected weight change.  HENT:  Negative for congestion, ear pain, rhinorrhea, sinus pressure and sore throat.   Eyes:  Negative for pain, redness and visual disturbance.  Respiratory:  Negative for cough, shortness of breath and wheezing.   Cardiovascular:  Negative for chest pain and palpitations.  Gastrointestinal:  Positive for diarrhea and nausea. Negative for abdominal pain, blood in stool and constipation.       GI side eff  Endocrine: Negative for polydipsia and polyuria.  Genitourinary:  Negative for dysuria, frequency and urgency.  Musculoskeletal:  Positive for arthralgias. Negative for back pain and myalgias.  Skin:  Negative for pallor and rash.  Allergic/Immunologic: Negative for environmental allergies.  Neurological:  Negative for dizziness, syncope and headaches.  Hematological:  Negative for adenopathy. Does not bruise/bleed easily.  Psychiatric/Behavioral:  Negative for decreased concentration and dysphoric mood. The patient is not nervous/anxious.       Objective:   Physical Exam Constitutional:      General: She is not in acute distress.    Appearance: Normal appearance. She is well-developed and normal weight. She is not ill-appearing or diaphoretic.  HENT:     Head: Normocephalic and atraumatic.     Right Ear: Tympanic membrane, ear canal and external ear normal.     Left Ear: Tympanic membrane, ear canal and external ear normal.     Nose: Nose normal. No congestion.     Mouth/Throat:     Mouth: Mucous membranes are moist.     Pharynx: Oropharynx is clear. No posterior oropharyngeal erythema.  Eyes:     General: No scleral icterus.    Extraocular Movements: Extraocular movements intact.     Conjunctiva/sclera: Conjunctivae normal.     Pupils: Pupils are equal, round, and reactive to light.  Neck:     Thyroid: No thyromegaly.  Vascular: No carotid bruit or JVD.  Cardiovascular:     Rate and Rhythm: Normal rate and regular rhythm.     Pulses: Normal pulses.     Heart sounds: Normal heart sounds.    No gallop.  Pulmonary:     Effort: Pulmonary effort is normal. No respiratory distress.     Breath sounds: Normal breath sounds. No wheezing.     Comments: Good air exch Chest:     Chest wall: No tenderness.  Abdominal:     General: Bowel sounds are normal. There is no distension or abdominal bruit.     Palpations: Abdomen is soft. There is no mass.     Tenderness: There is no abdominal tenderness.     Hernia: No hernia is present.  Genitourinary:    Comments: Breast exam: No mass, nodules, thickening, tenderness, bulging, retraction, inflamation, nipple discharge or skin changes noted.  No axillary or clavicular LA.     Musculoskeletal:        General: No tenderness. Normal range of motion.     Cervical back: Normal range of motion and neck supple. No rigidity. No muscular tenderness.     Right lower leg: No edema.     Left lower leg: No edema.  Lymphadenopathy:     Cervical: No cervical adenopathy.  Skin:    General: Skin is warm and dry.     Coloration: Skin is not pale.     Findings: No erythema or rash.     Comments: Solar lentigines diffusely Some sks  Neurological:     Mental Status: She is alert. Mental status is at baseline.     Cranial Nerves: No cranial nerve deficit.     Motor: No abnormal muscle tone.     Coordination: Coordination normal.     Gait: Gait normal.     Deep Tendon Reflexes: Reflexes are normal and symmetric. Reflexes normal.  Psychiatric:        Mood and Affect: Mood normal.        Cognition and Memory: Cognition and memory normal.          Assessment & Plan:   Problem List Items Addressed This Visit       Endocrine   Hypothyroidism    Hypothyroidism  Pt has no clinical changes No change in energy level/ hair or skin/ edema and no tremor Lab Results  Component  Value Date   TSH 3.25 04/23/2021    Plan to continue levothyroxine 25 mcg daily        Other   Routine general medical examination at a health care facility    Reviewed health habits including diet and exercise and skin cancer prevention Reviewed appropriate screening tests for age  Also reviewed health mt list, fam hx and immunization status , as well as social and family history   See HPI Labs reviewed  Colonoscopy utd Mammogram ordered  imms utd  dexa normal 2019 and no falls or fx taking D Adv directive utd No cognitive concerns Reviewed hearing screen-no action per pt req utd eye/vision care        Blood glucose elevated    Lab Results  Component Value Date   HGBA1C 5.3 04/23/2021  disc imp of low glycemic diet and wt loss to prevent DM2       MDS (myelodysplastic syndrome), low grade ([place])    Continues tx per oncology She struggles with side eff        Medicare annual wellness visit,  subsequent    Reviewed health habits including diet and exercise and skin cancer prevention Reviewed appropriate screening tests for age  Also reviewed health mt list, fam hx and immunization status , as well as social and family history   See HPI Labs reviewed  Colonoscopy utd Mammogram ordered  imms utd  dexa normal 2019 and no falls or fx taking D Adv directive utd No cognitive concerns Reviewed hearing screen-no action per pt req utd eye/vision care        Encounter for screening mammogram for breast cancer - Primary    Ordered mammogram Pt will schedule  Enc self exams        Relevant Orders   MM 3D SCREEN BREAST BILATERAL   Vitamin D deficiency    Pt takes high dose weekly tx per oncology

## 2021-04-27 NOTE — Patient Instructions (Addendum)
Don't forget to schedule your mammogram   Take care of yourself  Keep up a good fluid intake   Get your flu shot in the fall

## 2021-05-04 ENCOUNTER — Other Ambulatory Visit: Payer: Self-pay | Admitting: *Deleted

## 2021-05-04 DIAGNOSIS — D4620 Refractory anemia with excess of blasts, unspecified: Secondary | ICD-10-CM

## 2021-05-04 MED ORDER — LENALIDOMIDE 10 MG PO CAPS: 10.0000 mg | ORAL_CAPSULE | Freq: Every day | ORAL | 0 refills | Status: DC

## 2021-05-04 NOTE — Telephone Encounter (Signed)
Spoke with patient. She stated that she started her cycle on 05/10/21 with left over tablets. Dr. B reviewed her prescription and biologics would only give her 11 tablets since she had RFs on file.  Survey in [place] will not be due to ??Aug19, 2022. I will need to call [employer] to get this prescription rems # fixed.

## 2021-05-05 ENCOUNTER — Telehealth: Payer: Self-pay | Admitting: *Deleted

## 2021-05-05 NOTE — Telephone Encounter (Signed)
This was sent yesterday  lenalidomide (REVLIMID) 10 MG capsule 21 capsule 0 05/04/2021    Sig - Route: Take 1 capsule (10 mg total) by mouth daily. 3 weeks-On and 1 week-OFF. - Oral   Sent to pharmacy as: lenalidomide (REVLIMID) 10 MG capsule   Notes to Pharmacy: Fanny Dance # [id number] Date Obtained 05/04/21   E-Prescribing Status: Receipt confirmed by pharmacy (05/04/2021  5:33 PM EDT)

## 2021-05-05 NOTE — Telephone Encounter (Signed)
Pharmacy requesting urgent refill of Revlimid.

## 2021-05-13 ENCOUNTER — Encounter: Payer: Self-pay | Admitting: Internal Medicine

## 2021-05-13 ENCOUNTER — Inpatient Hospital Stay: Payer: PPO | Attending: Internal Medicine

## 2021-05-13 ENCOUNTER — Inpatient Hospital Stay: Payer: PPO

## 2021-05-13 ENCOUNTER — Inpatient Hospital Stay (HOSPITAL_BASED_OUTPATIENT_CLINIC_OR_DEPARTMENT_OTHER): Payer: PPO | Admitting: Internal Medicine

## 2021-05-13 ENCOUNTER — Other Ambulatory Visit: Payer: Self-pay

## 2021-05-13 DIAGNOSIS — D4620 Refractory anemia with excess of blasts, unspecified: Secondary | ICD-10-CM

## 2021-05-13 DIAGNOSIS — D461 Refractory anemia with ring sideroblasts: Secondary | ICD-10-CM | POA: Insufficient documentation

## 2021-05-13 DIAGNOSIS — D469 Myelodysplastic syndrome, unspecified: Secondary | ICD-10-CM | POA: Insufficient documentation

## 2021-05-13 DIAGNOSIS — R197 Diarrhea, unspecified: Secondary | ICD-10-CM | POA: Insufficient documentation

## 2021-05-13 DIAGNOSIS — Z803 Family history of malignant neoplasm of breast: Secondary | ICD-10-CM | POA: Insufficient documentation

## 2021-05-13 DIAGNOSIS — I50811 Acute right heart failure: Secondary | ICD-10-CM

## 2021-05-13 LAB — LACTATE DEHYDROGENASE: LDH: 95 U/L — ABNORMAL LOW (ref 98–192)

## 2021-05-13 LAB — CBC WITH DIFFERENTIAL/PLATELET
Abs Immature Granulocytes: 0.02 10*3/uL (ref 0.00–0.07)
Basophils Absolute: 0.1 10*3/uL (ref 0.0–0.1)
Basophils Relative: 3 %
Eosinophils Absolute: 0.3 10*3/uL (ref 0.0–0.5)
Eosinophils Relative: 9 %
HCT: 30.8 % — ABNORMAL LOW (ref 36.0–46.0)
Hemoglobin: 10.2 g/dL — ABNORMAL LOW (ref 12.0–15.0)
Immature Granulocytes: 1 %
Lymphocytes Relative: 33 %
Lymphs Abs: 1 10*3/uL (ref 0.7–4.0)
MCH: 36.4 pg — ABNORMAL HIGH (ref 26.0–34.0)
MCHC: 33.1 g/dL (ref 30.0–36.0)
MCV: 110 fL — ABNORMAL HIGH (ref 80.0–100.0)
Monocytes Absolute: 0.4 10*3/uL (ref 0.1–1.0)
Monocytes Relative: 14 %
Neutro Abs: 1.2 10*3/uL — ABNORMAL LOW (ref 1.7–7.7)
Neutrophils Relative %: 40 %
Platelets: 201 10*3/uL (ref 150–400)
RBC: 2.8 MIL/uL — ABNORMAL LOW (ref 3.87–5.11)
RDW: 16.7 % — ABNORMAL HIGH (ref 11.5–15.5)
WBC: 3 10*3/uL — ABNORMAL LOW (ref 4.0–10.5)
nRBC: 0 % (ref 0.0–0.2)

## 2021-05-13 LAB — COMPREHENSIVE METABOLIC PANEL
ALT: 16 U/L (ref 0–44)
AST: 18 U/L (ref 15–41)
Albumin: 4 g/dL (ref 3.5–5.0)
Alkaline Phosphatase: 55 U/L (ref 38–126)
Anion gap: 7 (ref 5–15)
BUN: 16 mg/dL (ref 8–23)
CO2: 26 mmol/L (ref 22–32)
Calcium: 8.7 mg/dL — ABNORMAL LOW (ref 8.9–10.3)
Chloride: 103 mmol/L (ref 98–111)
Creatinine, Ser: 0.9 mg/dL (ref 0.44–1.00)
GFR, Estimated: >60 mL/min (ref >60)
Glucose, Bld: 132 mg/dL — ABNORMAL HIGH (ref 70–99)
Potassium: 3.7 mmol/L (ref 3.5–5.1)
Sodium: 136 mmol/L (ref 135–145)
Total Bilirubin: 0.9 mg/dL (ref 0.3–1.2)
Total Protein: 7.8 g/dL (ref 6.5–8.1)

## 2021-05-13 NOTE — Assessment & Plan Note (Addendum)
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 Poor response to Aranesp; currently on Aranesp 500 mcg weekly subcu injections; and  lenalidomide 10 mg  3w-ON & 1 w-OFF-tolerating fairly well except for mild GI effects/mild neutropenia. STABLE   # Hemoglobin is 10.2; HOLD Aranesp.  Monitor closely. Continue Revlimid 29m; days-1-21; 7 days OFF. STABLE.   # Diarrhea/dyspepsis-G-1-2; sec to revlimid- monitor for now; discussed regarding use of cholestyramine/decrease fat intake. S/p evaluation with Joli  # DISPOSITION:  # HOLD  Aranesp. # in 4 weeks- MD; labs- cbc/Cmp;LDH; Vit D 25-OH levels;  possible; Aranesp SQ- Dr.B

## 2021-05-13 NOTE — Progress Notes (Signed)
[facility] CONSULT NOTE  Patient Care Team: [name], [name], MD as PCP - General Rogue Bussing, [name], MD as Consulting Physician (Hematology and Oncology)  CHIEF COMPLAINTS/PURPOSE OF CONSULTATION: MDS   Oncology History Overview Note   # July 2020-myelodysplastic syndrome with ringed sideroblasts-Normal karyotype; no blasts [IPSS R-Very low risk ~median survival 8.3 years]; iron studies T51 folic acid myeloma panel normal; pyridoxine levels/copper/zinc-WNL. Erythropoietin levels-60. II OPINION at [place] [Dr.DeCastro]  # [facility]/ NGS: TET2([id number])c.2524delT(p.Ser842GlnfsTer31) Exon 3 frame-shift SF3B1([id number])c.2098A>G(p.Lys700Glu) Exon 15 missense  DNMT3A([id number])c.2204A>G(p.Tyr735Cys) Exon 19 missense   # JAN 11th 2020- Aranesp/retacrit;  # July 30th, 2021- START REVLIMID 10 mg/day  [SF3B43mtation]  #Mild hypothyroidism-on Synthroid; September 2021 ejection fraction 60 to 65%;   # colonoscopy- 2016 [Dr.Bucinni];  DIAGNOSIS: MDS/low-grade with ringed sideroblasts  STAGE: Low       ;  GOALS: Control  CURRENT/MOST RECENT THERAPY : EPO agent+ REV    MDS (myelodysplastic syndrome), low grade ([facility]  04/23/2019 Initial Diagnosis   MDS (myelodysplastic syndrome), low grade ([facility])      HISTORY OF PRESENTING ILLNESS:  Carla BondsMay 70 y.o.  female history of low-grade MDS anemia with ring sideroblasts currently on Aranesp/Revlimid is here for follow-up.  Patient denies any fevers or chills.  No nausea no vomiting.  No cough.  Chronic mild diarrhea.  No chest pain or shortness of breath.  Review of Systems  Constitutional:  Positive for malaise/fatigue. Negative for chills, diaphoresis and fever.  HENT:  Negative for nosebleeds and sore throat.   Eyes:  Negative for double vision.  Respiratory:  Negative for cough, hemoptysis, sputum production, shortness of breath and wheezing.   Cardiovascular:  Negative for chest pain and orthopnea.  Gastrointestinal:   Negative for abdominal pain, blood in stool, constipation, diarrhea, heartburn, melena and vomiting.  Genitourinary:  Negative for dysuria, frequency and urgency.  Musculoskeletal:  Positive for back pain and joint pain.  Skin: Negative.  Negative for itching and rash.  Neurological:  Negative for dizziness, tingling, focal weakness, weakness and headaches.  Endo/Heme/Allergies:  Does not bruise/bleed easily.  Psychiatric/Behavioral:  Negative for depression. The patient is not nervous/anxious and does not have insomnia.    MEDICAL HISTORY:  Past Medical History:  Diagnosis Date  . Allergic rhinitis, cause unspecified   . Anemia, unspecified   . Carpal tunnel syndrome   . Cystitis, unspecified   . Family history of osteoporosis   . PONV (postoperative nausea and vomiting)   . Postnasal drip   . Syncope and collapse     SURGICAL HISTORY: Past Surgical History:  Procedure Laterality Date  . CARPAL TUNNEL RELEASE    . COLONOSCOPY  2005  . COLONOSCOPY WITH PROPOFOL N/A 07/31/2015   Procedure: COLONOSCOPY WITH PROPOFOL;  Surgeon: RRonald Lobo MD;  Location: [facility];  Service: Endoscopy;  Laterality: N/A;  . DIAGNOSTIC LAPAROSCOPY     dx lack of pregnancy   . EYE SURGERY     lasik, cataract, prk,yag procedure    SOCIAL HISTORY: Social History   Socioeconomic History  . Marital status: Married    Spouse name: Not on file  . Number of children: Not on file  . Years of education: Not on file  . Highest education level: Not on file  Occupational History  . Not on file  Tobacco Use  . Smoking status: Never  . Smokeless tobacco: Never  Vaping Use  . Vaping Use: Never used  Substance and Sexual Activity  . Alcohol use: Yes  Alcohol/week: 0.0 standard drinks    Comment: Rare  . Drug use: No  . Sexual activity: Not Currently  Other Topics Concern  . Not on file  Social History Narrative   No regular exercise      Drinks lots of [employer]         Social  Determinants of Health   Financial Resource Strain: Not on file  Food Insecurity: Not on file  Transportation Needs: Not on file  Physical Activity: Not on file  Stress: Not on file  Social Connections: Not on file  Intimate Partner Violence: Not on file    FAMILY HISTORY: Family History  Problem Relation Age of Onset  . Osteoporosis Mother   . Stroke Mother   . Coronary artery disease Father   . Stroke Father 2  . Diabetes Other        Aunts and uncles  . Breast cancer Paternal Aunt   . Breast cancer Maternal Aunt   . Arthritis/Rheumatoid Child   . Breast cancer Cousin     ALLERGIES:  is allergic to codeine.  MEDICATIONS:  Current Outpatient Medications  Medication Sig Dispense Refill  . acetaminophen (TYLENOL) 500 MG tablet Take 500-1,000 mg by mouth every 6 (six) hours as needed for mild pain or headache.    Marland Kitchen aspirin EC 81 MG tablet Take 81 mg by mouth at bedtime. Swallow whole.     Marland Kitchen BIOTIN PO Take 1 tablet by mouth 3 (three) times a week.     . Darbepoetin Alfa (ARANESP) 500 MCG/ML SOSY injection Inject 500 mcg into the skin See admin instructions. Inject 500 mcg into the skin every week    . ergocalciferol (VITAMIN D2) 1.25 MG (50000 UT) capsule Take 1 capsule (50,000 Units total) by mouth once a week. 12 capsule 1  . lenalidomide (REVLIMID) 10 MG capsule Take 1 capsule (10 mg total) by mouth daily. 3 weeks-On and 1 week-OFF. 21 capsule 0  . levothyroxine (SYNTHROID) 25 MCG tablet TAKE 1 TABLET BY MOUTH ONCE A DAY BEFOREBREAKFAST. 90 tablet 0  . loperamide (IMODIUM) 2 MG capsule Take by mouth as needed for diarrhea or loose stools.    . ondansetron (ZOFRAN ODT) 8 MG disintegrating tablet Take 1 tablet (8 mg total) by mouth every 8 (eight) hours as needed for nausea or vomiting. 30 tablet 0   No current facility-administered medications for this visit.      PHYSICAL EXAMINATION:   Vitals:   05/13/21 1058  BP: 125/67  Pulse: (!) 59  Resp: 20  Temp: 97.6 F  (36.4 C)    Filed Weights   05/13/21 1058  Weight: 148 lb (67.1 kg)     Physical Exam Constitutional:      Comments: Walk independently.  Accompanied by husband.  HENT:     Head: Normocephalic and atraumatic.     Mouth/Throat:     Pharynx: No oropharyngeal exudate.  Eyes:     Pupils: Pupils are equal, round, and reactive to light.  Cardiovascular:     Rate and Rhythm: Normal rate and regular rhythm.     Pulses: Normal pulses.     Heart sounds: Normal heart sounds.  Pulmonary:     Effort: Pulmonary effort is normal. No respiratory distress.     Breath sounds: Normal breath sounds. No wheezing.  Abdominal:     General: Bowel sounds are normal. There is no distension.     Palpations: Abdomen is soft. There is no mass.  Tenderness: no abdominal tenderness There is no guarding or rebound.  Musculoskeletal:        General: No tenderness. Normal range of motion.     Cervical back: Normal range of motion and neck supple.  Skin:    General: Skin is warm.  Neurological:     Mental Status: Carla [name] is alert and oriented to person, place, and time.  Psychiatric:        Mood and Affect: Affect normal.    LABORATORY DATA:  I have reviewed the data as listed Lab Results  Component Value Date   WBC 3.0 (L) 05/13/2021   HGB 10.2 (L) 05/13/2021   HCT 30.8 (L) 05/13/2021   MCV 110.0 (H) 05/13/2021   PLT 201 05/13/2021   Recent Labs    06/03/20 1351 06/17/20 1516 06/24/20 1504 07/01/20 1301 03/17/21 1044 04/15/21 1045 04/23/21 0921 05/13/21 1040  NA 137 138 140   < > 138 138 139 136  K 4.3 3.6 3.5   < > 3.4* 3.7 3.7 3.7  CL 104 104 105   < > 102 103 104 103  CO2 '26 27 27   ' < > '26 26 27 26  ' GLUCOSE 113* 134* 101*   < > 124* 124* 90 132*  BUN '15 13 8   ' < > '15 12 12 16  ' CREATININE 1.10* 0.95 1.08*   < > 0.90 0.93 1.01 0.90  CALCIUM 8.2* 8.5* 8.6*   < > 9.0 8.7* 9.0 8.7*  GFRNONAA 51* >60 52*   < > >60 >60  --  >60  GFRAA 59* >60 >60  --   --   --   --   --   PROT 7.3 7.7  7.7   < > 7.9 7.6 7.5 7.8  ALBUMIN 3.5 3.9 4.1   < > 4.1 4.1 4.1 4.0  AST '19 17 18   ' < > '22 21 15 18  ' ALT '20 15 15   ' < > '19 16 14 16  ' ALKPHOS 43 51 50   < > 51 48 50 55  BILITOT 1.6* 1.0 1.1   < > 1.1 0.8 0.7 0.9   < > = values in this interval not displayed.     No results found.  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 POSITIVE  For SF3B1; R-IPSS-low risk. Poor response to Aranesp; currently on Aranesp 500 mcg weekly subcu injections; and  lenalidomide 10 mg  3w-ON & 1 w-OFF-tolerating fairly well except for mild GI effects/mild neutropenia. STABLE   # Hemoglobin is 10.2; HOLD Aranesp.  Monitor closely. Continue Revlimid 40m; days-1-21; 7 days OFF. STABLE.   # Diarrhea/dyspepsis-G-1-2; sec to revlimid- monitor for now; discussed regarding use of cholestyramine/decrease fat intake. S/p evaluation with Joli  # DISPOSITION:  # HOLD  Aranesp. # in 4 weeks- MD; labs- cbc/Cmp;LDH; Vit D 25-OH levels;  possible; Aranesp SQ- Dr.B  All questions were answered. The patient knows to call the clinic with any problems, questions or concerns.   GCammie Sickle MD 05/24/2021 9:54 PM

## 2021-05-14 LAB — SAMPLE TO BLOOD BANK

## 2021-05-24 ENCOUNTER — Encounter: Payer: Self-pay | Admitting: Internal Medicine

## 2021-05-26 ENCOUNTER — Other Ambulatory Visit: Payer: Self-pay | Admitting: *Deleted

## 2021-05-26 DIAGNOSIS — D4620 Refractory anemia with excess of blasts, unspecified: Secondary | ICD-10-CM

## 2021-05-26 NOTE — Telephone Encounter (Signed)
Unable to do the rems survey until next week- ??auth does not expire until Sept 07, 2022

## 2021-05-27 DIAGNOSIS — Z961 Presence of intraocular lens: Secondary | ICD-10-CM | POA: Diagnosis not present

## 2021-05-28 ENCOUNTER — Encounter: Payer: Self-pay | Admitting: Internal Medicine

## 2021-05-28 MED ORDER — LENALIDOMIDE 10 MG PO CAPS
10.0000 mg | ORAL_CAPSULE | Freq: Every day | ORAL | 0 refills | Status: DC
Start: 2021-05-28 — End: 2021-06-24

## 2021-05-29 ENCOUNTER — Ambulatory Visit
Admission: RE | Admit: 2021-05-29 | Discharge: 2021-05-29 | Disposition: A | Discharge status: Home / Self Care | Payer: PPO | Source: Ambulatory Visit | Attending: Family Medicine | Admitting: Family Medicine

## 2021-05-29 ENCOUNTER — Other Ambulatory Visit: Payer: Self-pay

## 2021-05-29 DIAGNOSIS — Z1231 Encounter for screening mammogram for malignant neoplasm of breast: Secondary | ICD-10-CM | POA: Diagnosis not present

## 2021-06-10 ENCOUNTER — Inpatient Hospital Stay (HOSPITAL_BASED_OUTPATIENT_CLINIC_OR_DEPARTMENT_OTHER): Payer: PPO | Admitting: Internal Medicine

## 2021-06-10 ENCOUNTER — Encounter: Payer: Self-pay | Admitting: Internal Medicine

## 2021-06-10 ENCOUNTER — Inpatient Hospital Stay: Payer: PPO

## 2021-06-10 ENCOUNTER — Inpatient Hospital Stay: Payer: PPO | Attending: Internal Medicine

## 2021-06-10 ENCOUNTER — Other Ambulatory Visit: Payer: Self-pay

## 2021-06-10 DIAGNOSIS — D461 Refractory anemia with ring sideroblasts: Secondary | ICD-10-CM | POA: Diagnosis not present

## 2021-06-10 DIAGNOSIS — Z79899 Other long term (current) drug therapy: Secondary | ICD-10-CM | POA: Insufficient documentation

## 2021-06-10 DIAGNOSIS — Z7982 Long term (current) use of aspirin: Secondary | ICD-10-CM | POA: Insufficient documentation

## 2021-06-10 DIAGNOSIS — D4620 Refractory anemia with excess of blasts, unspecified: Secondary | ICD-10-CM

## 2021-06-10 LAB — COMPREHENSIVE METABOLIC PANEL
ALT: 17 U/L (ref 0–44)
AST: 18 U/L (ref 15–41)
Albumin: 4.2 g/dL (ref 3.5–5.0)
Alkaline Phosphatase: 51 U/L (ref 38–126)
Anion gap: 5 (ref 5–15)
BUN: 14 mg/dL (ref 8–23)
CO2: 27 mmol/L (ref 22–32)
Calcium: 9 mg/dL (ref 8.9–10.3)
Chloride: 106 mmol/L (ref 98–111)
Creatinine, Ser: 0.9 mg/dL (ref 0.44–1.00)
GFR, Estimated: >60 mL/min (ref >60)
Glucose, Bld: 92 mg/dL (ref 70–99)
Potassium: 3.9 mmol/L (ref 3.5–5.1)
Sodium: 138 mmol/L (ref 135–145)
Total Bilirubin: 0.9 mg/dL (ref 0.3–1.2)
Total Protein: 7.9 g/dL (ref 6.5–8.1)

## 2021-06-10 LAB — CBC WITH DIFFERENTIAL/PLATELET
Abs Immature Granulocytes: 0.04 10*3/uL (ref 0.00–0.07)
Basophils Absolute: 0.1 10*3/uL (ref 0.0–0.1)
Basophils Relative: 4 %
Eosinophils Absolute: 0.3 10*3/uL (ref 0.0–0.5)
Eosinophils Relative: 8 %
HCT: 29.5 % — ABNORMAL LOW (ref 36.0–46.0)
Hemoglobin: 9.9 g/dL — ABNORMAL LOW (ref 12.0–15.0)
Immature Granulocytes: 1 %
Lymphocytes Relative: 35 %
Lymphs Abs: 1.2 10*3/uL (ref 0.7–4.0)
MCH: 36 pg — ABNORMAL HIGH (ref 26.0–34.0)
MCHC: 33.6 g/dL (ref 30.0–36.0)
MCV: 107.3 fL — ABNORMAL HIGH (ref 80.0–100.0)
Monocytes Absolute: 0.6 10*3/uL (ref 0.1–1.0)
Monocytes Relative: 17 %
Neutro Abs: 1.2 10*3/uL — ABNORMAL LOW (ref 1.7–7.7)
Neutrophils Relative %: 35 %
Platelets: 226 10*3/uL (ref 150–400)
RBC: 2.75 MIL/uL — ABNORMAL LOW (ref 3.87–5.11)
RDW: 15.9 % — ABNORMAL HIGH (ref 11.5–15.5)
WBC: 3.4 10*3/uL — ABNORMAL LOW (ref 4.0–10.5)
nRBC: 0 % (ref 0.0–0.2)

## 2021-06-10 LAB — LACTATE DEHYDROGENASE: LDH: 98 U/L (ref 98–192)

## 2021-06-10 MED ORDER — DARBEPOETIN ALFA 500 MCG/ML IJ SOSY
500.0000 ug | PREFILLED_SYRINGE | Freq: Once | INTRAMUSCULAR | Status: AC
  Administered 2021-06-10: 500 ug via SUBCUTANEOUS
  Filled 2021-06-10: qty 1

## 2021-06-10 NOTE — Assessment & Plan Note (Addendum)
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 Poor response to Aranesp; currently on Aranesp 500 mcg weekly subcu injections; and  lenalidomide 10 mg  3w-ON & 1 w-OFF-tolerating fairly well except for mild GI effects/mild neutropenia. STABLE.    # Hemoglobin is 9.9 Proceed with Aranesp.  Monitor closely. Continue Revlimid 31m; days-1-21; 7 days OFF. STABLE   # Diarrhea/dyspepsis-G-1-2; sec to revlimid- monitor for now; discussed regarding use of cholestyramine- /decrease fat intake. STABLE; controlled with immodium prn.   # DISPOSITION:  # today Aranesp. # in 4 weeks- MD; labs- cbc/Cmp;LDH;  possible; Aranesp SQ- Dr.B

## 2021-06-10 NOTE — Progress Notes (Signed)
[place] [facility] CONSULT NOTE  Patient Care Team: [name], [name], MD as PCP - General Rogue Bussing, [name], MD as Consulting Physician (Hematology and Oncology)  CHIEF COMPLAINTS/PURPOSE OF CONSULTATION: MDS   Oncology History Overview Note   # July 2020-myelodysplastic syndrome with ringed sideroblasts-Normal karyotype; no blasts [IPSS R-Very low risk ~median survival 8.3 years]; iron studies X93 folic acid myeloma panel normal; pyridoxine levels/copper/zinc-WNL. Erythropoietin levels-60. II OPINION at [facility] [Dr.DeCastro]  # [facility]/ NGS: TET2([id number])c.2524delT(p.Ser842GlnfsTer31) Exon 3 frame-shift SF3B1([id number])c.2098A>G(p.Lys700Glu) Exon 15 missense  DNMT3A(NM_[id number])c.2204A>G(p.Tyr735Cys) Exon 19 missense   # JAN 11th 2020- Aranesp/retacrit;  # July 30th, 2021- START REVLIMID 10 mg/day  [SF3B32mtation]  #Mild hypothyroidism-on Synthroid; September 2021 ejection fraction 60 to 65%;   # colonoscopy- 2016 [Dr.Bucinni];  DIAGNOSIS: MDS/low-grade with ringed sideroblasts  STAGE: Low       ;  GOALS: Control  CURRENT/MOST RECENT THERAPY : EPO agent+ REV    MDS (myelodysplastic syndrome), low grade ([place]  04/23/2019 Initial Diagnosis   MDS (myelodysplastic syndrome), low grade (H[facility])      HISTORY OF PRESENTING ILLNESS: alone.  Carla GravelleMay 70 y.o.  female history of low-grade MDS anemia with ring sideroblasts currently on Aranesp/Revlimid is here for follow-up.  Patient denies any fevers or chills.  No nausea no vomiting.  No cough.  Chronic mild diarrhea.  No chest pain or shortness of breath.  Review of Systems  Constitutional:  Positive for malaise/fatigue. Negative for chills, diaphoresis and fever.  HENT:  Negative for nosebleeds and sore throat.   Eyes:  Negative for double vision.  Respiratory:  Negative for cough, hemoptysis, sputum production, shortness of breath and wheezing.   Cardiovascular:  Negative for chest pain and orthopnea.   Gastrointestinal:  Negative for abdominal pain, blood in stool, constipation, diarrhea, heartburn, melena and vomiting.  Genitourinary:  Negative for dysuria, frequency and urgency.  Musculoskeletal:  Positive for back pain and joint pain.  Skin: Negative.  Negative for itching and rash.  Neurological:  Negative for dizziness, tingling, focal weakness, weakness and headaches.  Endo/Heme/Allergies:  Does not bruise/bleed easily.  Psychiatric/Behavioral:  Negative for depression. The patient is not nervous/anxious and does not have insomnia.    MEDICAL HISTORY:  Past Medical History:  Diagnosis Date   Allergic rhinitis, cause unspecified    Anemia, unspecified    Carpal tunnel syndrome    Cystitis, unspecified    Family history of osteoporosis    PONV (postoperative nausea and vomiting)    Postnasal drip    Syncope and collapse     SURGICAL HISTORY: Past Surgical History:  Procedure Laterality Date   CARPAL TUNNEL RELEASE     COLONOSCOPY  2005   COLONOSCOPY WITH PROPOFOL N/A 07/31/2015   Procedure: COLONOSCOPY WITH PROPOFOL;  Surgeon: RRonald Lobo MD;  Location: [facility];  Service: Endoscopy;  Laterality: N/A;   DIAGNOSTIC LAPAROSCOPY     dx lack of pregnancy    EYE SURGERY     lasik, cataract, prk,yag procedure    SOCIAL HISTORY: Social History   Socioeconomic History   Marital status: Married    Spouse name: Not on file   Number of children: Not on file   Years of education: Not on file   Highest education level: Not on file  Occupational History   Not on file  Tobacco Use   Smoking status: Never   Smokeless tobacco: Never  Vaping Use   Vaping Use: Never used  Substance and Sexual Activity   Alcohol use: Yes  Alcohol/week: 0.0 standard drinks    Comment: Rare   Drug use: No   Sexual activity: Not Currently  Other Topics Concern   Not on file  Social History Narrative   No regular exercise      Drinks lots of Pepsi         Social Determinants  of Health   Financial Resource Strain: Not on file  Food Insecurity: Not on file  Transportation Needs: Not on file  Physical Activity: Not on file  Stress: Not on file  Social Connections: Not on file  Intimate Partner Violence: Not on file    FAMILY HISTORY: Family History  Problem Relation Age of Onset   Osteoporosis Mother    Stroke Mother    Coronary artery disease Father    Stroke Father 65   Diabetes Other        Aunts and uncles   Breast cancer Paternal Aunt    Breast cancer Maternal Aunt    Arthritis/Rheumatoid Child    Breast cancer Cousin     ALLERGIES:  is allergic to codeine.  MEDICATIONS:  Current Outpatient Medications  Medication Sig Dispense Refill   acetaminophen (TYLENOL) 500 MG tablet Take 500-1,000 mg by mouth every 6 (six) hours as needed for mild pain or headache.     aspirin EC 81 MG tablet Take 81 mg by mouth at bedtime. Swallow whole.      BIOTIN PO Take 1 tablet by mouth 3 (three) times a week.      Darbepoetin Alfa (ARANESP) 500 MCG/ML SOSY injection Inject 500 mcg into the skin See admin instructions. Inject 500 mcg into the skin every week     ergocalciferol (VITAMIN D2) 1.25 MG (50000 UT) capsule Take 1 capsule (50,000 Units total) by mouth once a week. 12 capsule 1   lenalidomide (REVLIMID) 10 MG capsule Take 1 capsule (10 mg total) by mouth daily. 3 weeks-On and 1 week-OFF. 21 capsule 0   levothyroxine (SYNTHROID) 25 MCG tablet TAKE 1 TABLET BY MOUTH ONCE A DAY BEFOREBREAKFAST. 90 tablet 0   loperamide (IMODIUM) 2 MG capsule Take by mouth as needed for diarrhea or loose stools.     ondansetron (ZOFRAN ODT) 8 MG disintegrating tablet Take 1 tablet (8 mg total) by mouth every 8 (eight) hours as needed for nausea or vomiting. 30 tablet 0   No current facility-administered medications for this visit.      PHYSICAL EXAMINATION:   Vitals:   06/10/21 0930  BP: (!) 117/57  Pulse: 60  Resp: 18  Temp: (!) 97.4 F (36.3 C)  SpO2: 100%     Filed Weights   06/10/21 0930  Weight: 147 lb 3.2 oz (66.8 kg)     Physical Exam HENT:     Head: Normocephalic and atraumatic.     Mouth/Throat:     Pharynx: No oropharyngeal exudate.  Eyes:     Pupils: Pupils are equal, round, and reactive to light.  Cardiovascular:     Rate and Rhythm: Normal rate and regular rhythm.     Pulses: Normal pulses.     Heart sounds: Normal heart sounds.  Pulmonary:     Effort: Pulmonary effort is normal. No respiratory distress.     Breath sounds: Normal breath sounds. No wheezing.  Abdominal:     General: Bowel sounds are normal. There is no distension.     Palpations: Abdomen is soft. There is no mass.     Tenderness: There is no abdominal tenderness. There  is no guarding or rebound.  Musculoskeletal:        General: No tenderness. Normal range of motion.     Cervical back: Normal range of motion and neck supple.  Skin:    General: Skin is warm.  Neurological:     Mental Status: She is alert and oriented to person, place, and time.  Psychiatric:        Mood and Affect: Affect normal.    LABORATORY DATA:  I have reviewed the data as listed Lab Results  Component Value Date   WBC 3.4 (L) 06/10/2021   HGB 9.9 (L) 06/10/2021   HCT 29.5 (L) 06/10/2021   MCV 107.3 (H) 06/10/2021   PLT 226 06/10/2021   Recent Labs    06/17/20 1516 06/24/20 1504 07/01/20 1301 04/15/21 1045 04/23/21 0921 05/13/21 1040 06/10/21 0907  NA 138 140   < > 138 139 136 138  K 3.6 3.5   < > 3.7 3.7 3.7 3.9  CL 104 105   < > 103 104 103 106  CO2 27 27   < > '26 27 26 27  ' GLUCOSE 134* 101*   < > 124* 90 132* 92  BUN 13 8   < > '12 12 16 14  ' CREATININE 0.95 1.08*   < > 0.93 1.01 0.90 0.90  CALCIUM 8.5* 8.6*   < > 8.7* 9.0 8.7* 9.0  GFRNONAA >60 52*   < > >60  --  >60 >60  GFRAA >60 >60  --   --   --   --   --   PROT 7.7 7.7   < > 7.6 7.5 7.8 7.9  ALBUMIN 3.9 4.1   < > 4.1 4.1 4.0 4.2  AST 17 18   < > '21 15 18 18  ' ALT 15 15   < > '16 14 16 17  ' ALKPHOS  51 50   < > 48 50 55 51  BILITOT 1.0 1.1   < > 0.8 0.7 0.9 0.9   < > = values in this interval not displayed.     MM 3D SCREEN BREAST BILATERAL  Result Date: 05/31/2021 CLINICAL DATA:  Screening. EXAM: DIGITAL SCREENING BILATERAL MAMMOGRAM WITH TOMOSYNTHESIS AND CAD TECHNIQUE: Bilateral screening digital craniocaudal and mediolateral oblique mammograms were obtained. Bilateral screening digital breast tomosynthesis was performed. The images were evaluated with computer-aided detection. COMPARISON:  Previous exam(s). ACR Breast Density Category b: There are scattered areas of fibroglandular density. FINDINGS: There are no findings suspicious for malignancy. IMPRESSION: No mammographic evidence of malignancy. A result letter of this screening mammogram will be mailed directly to the patient. RECOMMENDATION: Screening mammogram in one year. (Code:SM-B-01Y) BI-RADS CATEGORY  1: Negative. Electronically Signed   By: Abelardo Diesel M.D.   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 POSITIVE  For SF3B1; R-IPSS-low risk. Poor response to Aranesp; currently on Aranesp 500 mcg weekly subcu injections; and  lenalidomide 10 mg  3w-ON & 1 w-OFF-tolerating fairly well except for mild GI effects/mild neutropenia. STABLE.    # Hemoglobin is 9.9 Proceed with Aranesp.  Monitor closely. Continue Revlimid 47m; days-1-21; 7 days OFF. STABLE   # Diarrhea/dyspepsis-G-1-2; sec to revlimid- monitor for now; discussed regarding use of cholestyramine- /decrease fat intake. STABLE; controlled with immodium prn.   # DISPOSITION:  # today Aranesp. # in 4 weeks- MD; labs- cbc/Cmp;LDH;  possible; Aranesp SQ- Dr.B  All questions were  answered. The patient knows to call the clinic with any problems, questions or concerns.   Cammie Sickle, MD 06/10/2021 10:17 PM

## 2021-06-11 LAB — CALCITRIOL (1,25 DI-OH VIT D): Vit D, 1,25-Dihydroxy: 25.9 pg/mL (ref 24.8–81.5)

## 2021-06-22 ENCOUNTER — Other Ambulatory Visit: Payer: Self-pay | Admitting: Internal Medicine

## 2021-06-24 ENCOUNTER — Other Ambulatory Visit: Payer: Self-pay | Admitting: *Deleted

## 2021-06-24 DIAGNOSIS — D4620 Refractory anemia with excess of blasts, unspecified: Secondary | ICD-10-CM

## 2021-06-24 MED ORDER — LENALIDOMIDE 10 MG PO CAPS: 10.0000 mg | ORAL_CAPSULE | Freq: Every day | ORAL | 0 refills | Status: DC

## 2021-06-29 ENCOUNTER — Other Ambulatory Visit: Payer: Self-pay | Admitting: Family Medicine

## 2021-07-07 ENCOUNTER — Encounter: Payer: Self-pay | Admitting: Internal Medicine

## 2021-07-07 ENCOUNTER — Inpatient Hospital Stay: Payer: PPO

## 2021-07-07 ENCOUNTER — Inpatient Hospital Stay: Payer: PPO | Attending: Internal Medicine

## 2021-07-07 ENCOUNTER — Inpatient Hospital Stay (HOSPITAL_BASED_OUTPATIENT_CLINIC_OR_DEPARTMENT_OTHER): Payer: PPO | Admitting: Internal Medicine

## 2021-07-07 DIAGNOSIS — D4620 Refractory anemia with excess of blasts, unspecified: Secondary | ICD-10-CM

## 2021-07-07 DIAGNOSIS — Z803 Family history of malignant neoplasm of breast: Secondary | ICD-10-CM | POA: Insufficient documentation

## 2021-07-07 DIAGNOSIS — D469 Myelodysplastic syndrome, unspecified: Secondary | ICD-10-CM | POA: Insufficient documentation

## 2021-07-07 DIAGNOSIS — R197 Diarrhea, unspecified: Secondary | ICD-10-CM | POA: Insufficient documentation

## 2021-07-07 DIAGNOSIS — Z23 Encounter for immunization: Secondary | ICD-10-CM | POA: Insufficient documentation

## 2021-07-07 LAB — CBC WITH DIFFERENTIAL/PLATELET
Abs Immature Granulocytes: 0.03 10*3/uL (ref 0.00–0.07)
Basophils Absolute: 0.1 10*3/uL (ref 0.0–0.1)
Basophils Relative: 3 %
Eosinophils Absolute: 0.3 10*3/uL (ref 0.0–0.5)
Eosinophils Relative: 8 %
HCT: 30.6 % — ABNORMAL LOW (ref 36.0–46.0)
Hemoglobin: 10.2 g/dL — ABNORMAL LOW (ref 12.0–15.0)
Immature Granulocytes: 1 %
Lymphocytes Relative: 33 %
Lymphs Abs: 1.1 10*3/uL (ref 0.7–4.0)
MCH: 36.4 pg — ABNORMAL HIGH (ref 26.0–34.0)
MCHC: 33.3 g/dL (ref 30.0–36.0)
MCV: 109.3 fL — ABNORMAL HIGH (ref 80.0–100.0)
Monocytes Absolute: 0.6 10*3/uL (ref 0.1–1.0)
Monocytes Relative: 16 %
Neutro Abs: 1.4 10*3/uL — ABNORMAL LOW (ref 1.7–7.7)
Neutrophils Relative %: 39 %
Platelets: 201 10*3/uL (ref 150–400)
RBC: 2.8 MIL/uL — ABNORMAL LOW (ref 3.87–5.11)
RDW: 16.2 % — ABNORMAL HIGH (ref 11.5–15.5)
WBC: 3.5 10*3/uL — ABNORMAL LOW (ref 4.0–10.5)
nRBC: 0 % (ref 0.0–0.2)

## 2021-07-07 LAB — COMPREHENSIVE METABOLIC PANEL
ALT: 17 U/L (ref 0–44)
AST: 21 U/L (ref 15–41)
Albumin: 4.1 g/dL (ref 3.5–5.0)
Alkaline Phosphatase: 52 U/L (ref 38–126)
Anion gap: 7 (ref 5–15)
BUN: 14 mg/dL (ref 8–23)
CO2: 27 mmol/L (ref 22–32)
Calcium: 8.9 mg/dL (ref 8.9–10.3)
Chloride: 102 mmol/L (ref 98–111)
Creatinine, Ser: 1.01 mg/dL — ABNORMAL HIGH (ref 0.44–1.00)
GFR, Estimated: 60 mL/min — ABNORMAL LOW (ref >60)
Glucose, Bld: 133 mg/dL — ABNORMAL HIGH (ref 70–99)
Potassium: 3.6 mmol/L (ref 3.5–5.1)
Sodium: 136 mmol/L (ref 135–145)
Total Bilirubin: 0.7 mg/dL (ref 0.3–1.2)
Total Protein: 8.1 g/dL (ref 6.5–8.1)

## 2021-07-07 LAB — LACTATE DEHYDROGENASE: LDH: 95 U/L — ABNORMAL LOW (ref 98–192)

## 2021-07-07 MED ORDER — INFLUENZA VAC A&B SA ADJ QUAD 0.5 ML IM PRSY
0.5000 mL | PREFILLED_SYRINGE | Freq: Once | INTRAMUSCULAR | Status: AC
  Administered 2021-07-07: 0.5 mL via INTRAMUSCULAR
  Filled 2021-07-07: qty 0.5

## 2021-07-07 NOTE — Assessment & Plan Note (Addendum)
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 Poor response to single agent Aranesp. Currently on Aranesp 500 mcg weekly subcu injections; and  lenalidomide 10 mg  3w-ON & 1 w-OFF-tolerating fairly well except for mild GI effects/mild neutropenia. STABLE.    # Hemoglobin is 10.2-HOLD today- Aranesp.  Monitor closely. Continue Revlimid 3m; days-1-21; 7 days OFF. STABLE   # [place]; HOLD off dental cleaning at this time. Re-evaluate at next visit- 173monthrom now.  Patient understand that she will probably have to hold off Revlimid for approximately 1 week to avoid neutropenia/and proceed with dental cleaning.  # Diarrhea/dyspepsis-G-1-2; sec to revlimid- monitor for now; discussed regarding use of cholestyramine- /decrease fat intake. STABLE; controlled with immodium prn.   # COVID EVUSHELD PROPHYLAXIS: Given the immunosuppressive effects of treatments I would recommend EVUSHELD prophylaxis.  IM injection x2 [same time]-cutdown risk of Covid infection/next 6 months. [place] Will refer to LaBeckey RutterP who will speak to the patient.  Proceed with flu shot today.  # DISPOSITION:  # refer to lauren re: EVUSHELD # HOLD Aranesp. # in 4 weeks- MD; labs- cbc/Cmp;LDH;  possible; Aranesp SQ- Dr.B

## 2021-07-07 NOTE — Progress Notes (Signed)
[facility] CONSULT NOTE  Patient Care Team: [name], [name], MD as PCP - General Rogue Bussing, [name], MD as Consulting Physician (Hematology and Oncology)  CHIEF COMPLAINTS/PURPOSE OF CONSULTATION: MDS   Oncology History Overview Note   # July 2020-myelodysplastic syndrome with ringed sideroblasts-Normal karyotype; no blasts [IPSS R-Very low risk ~median survival 8.3 years]; iron studies A45 folic acid myeloma panel normal; pyridoxine levels/copper/zinc-WNL. Erythropoietin levels-60. II OPINION at 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  Carla RawlMay 70 y.o.  female history of low-grade MDS anemia with ring sideroblasts currently on Aranesp/Revlimid is here for follow-up.  Denies any fevers or chills.  No nausea no vomiting.  No chest pain or shortness with or cough.  Chronic mild diarrhea.  Questions about dental cleaning.  Review of Systems  Constitutional:  Positive for malaise/fatigue. Negative for chills, diaphoresis and fever.  HENT:  Negative for nosebleeds and sore throat.   Eyes:  Negative for double vision.  Respiratory:  Negative for cough, hemoptysis, sputum production, shortness of breath and wheezing.   Cardiovascular:  Negative  for chest pain and orthopnea.  Gastrointestinal:  Negative for abdominal pain, blood in stool, constipation, diarrhea, heartburn, melena and vomiting.  Genitourinary:  Negative for dysuria, frequency and urgency.  Musculoskeletal:  Positive for back pain and joint pain.  Skin: Negative.  Negative for itching and rash.  Neurological:  Negative for dizziness, tingling, focal weakness, weakness and headaches.  Endo/Heme/Allergies:  Does not bruise/bleed easily.  Psychiatric/Behavioral:  Negative for depression. The patient is not nervous/anxious and does not have insomnia.    MEDICAL HISTORY:  Past Medical History:  Diagnosis Date   Allergic rhinitis, cause unspecified    Anemia, unspecified    Carpal tunnel syndrome    Cystitis, unspecified    Family history of osteoporosis    PONV (postoperative nausea and vomiting)    Postnasal drip    Syncope and collapse     SURGICAL HISTORY: Past Surgical History:  Procedure Laterality Date   CARPAL TUNNEL RELEASE     COLONOSCOPY  2005   COLONOSCOPY WITH PROPOFOL N/A 07/31/2015   Procedure: COLONOSCOPY WITH PROPOFOL;  Surgeon: RRonald Lobo MD;  Location: [facility];  Service: Endoscopy;  Laterality: N/A;   DIAGNOSTIC LAPAROSCOPY     dx lack of pregnancy    EYE SURGERY     lasik, cataract, prk,yag procedure    SOCIAL HISTORY: Social History   Socioeconomic History   Marital status: Married    Spouse name: Not on file   Number of children: Not on file   Years of education: Not on file   Highest education level: Not on file  Occupational History   Not on file  Tobacco Use   Smoking status: Never   Smokeless tobacco: Never  Vaping Use   Vaping Use: Never used  Substance and Sexual Activity  Alcohol use: Yes    Alcohol/week: 0.0 standard drinks    Comment: Rare   Drug use: No   Sexual activity: Not Currently  Other Topics Concern   Not on file  Social History Narrative   No regular exercise      Drinks lots of Pepsi          Social Determinants of Health   Financial Resource Strain: Not on file  Food Insecurity: Not on file  Transportation Needs: Not on file  Physical Activity: Not on file  Stress: Not on file  Social Connections: Not on file  Intimate Partner Violence: Not on file    FAMILY HISTORY: Family History  Problem Relation Age of Onset   Osteoporosis Mother    Stroke Mother    Coronary artery disease Father    Stroke Father 42   Diabetes Other        Aunts and uncles   Breast cancer Paternal Aunt    Breast cancer Maternal Aunt    Arthritis/Rheumatoid Child    Breast cancer Cousin     ALLERGIES:  is allergic to codeine.  MEDICATIONS:  Current Outpatient Medications  Medication Sig Dispense Refill   acetaminophen (TYLENOL) 500 MG tablet Take 500-1,000 mg by mouth every 6 (six) hours as needed for mild pain or headache.     aspirin EC 81 MG tablet Take 81 mg by mouth at bedtime. Swallow whole.      BIOTIN PO Take 1 tablet by mouth 3 (three) times a week.      ergocalciferol (VITAMIN D2) 1.25 MG (50000 UT) capsule Take 1 capsule (50,000 Units total) by mouth once a week. 12 capsule 1   lenalidomide (REVLIMID) 10 MG capsule Take 1 capsule (10 mg total) by mouth daily. 3 weeks-On and 1 week-OFF. 21 capsule 0   levothyroxine (SYNTHROID) 25 MCG tablet TAKE 1 TABLET BY MOUTH ONCE A DAY BEFOREBREAKFAST. 90 tablet 1   Darbepoetin Alfa (ARANESP) 500 MCG/ML SOSY injection Inject 500 mcg into the skin See admin instructions. Inject 500 mcg into the skin every week     loperamide (IMODIUM) 2 MG capsule Take by mouth as needed for diarrhea or loose stools. (Patient not taking: Reported on 07/07/2021)     ondansetron (ZOFRAN ODT) 8 MG disintegrating tablet Take 1 tablet (8 mg total) by mouth every 8 (eight) hours as needed for nausea or vomiting. (Patient not taking: Reported on 07/07/2021) 30 tablet 0   No current facility-administered medications for this visit.   Facility-Administered  Medications Ordered in Other Visits  Medication Dose Route Frequency Provider Last Rate Last Admin   influenza vaccine adjuvanted (FLUAD) injection 0.5 mL  0.5 mL Intramuscular Once Charlaine Dalton R, MD          PHYSICAL EXAMINATION:   Vitals:   07/07/21 1029  BP: (!) 125/56  Pulse: 65  Resp: 16  Temp: (!) 97.3 F (36.3 C)  SpO2: 100%    Filed Weights   07/07/21 1029  Weight: 146 lb 9.6 oz (66.5 kg)     Physical Exam HENT:     Head: Normocephalic and atraumatic.     Mouth/Throat:     Pharynx: No oropharyngeal exudate.  Eyes:     Pupils: Pupils are equal, round, and reactive to light.  Cardiovascular:     Rate and Rhythm: Normal rate and regular rhythm.     Pulses: Normal pulses.     Heart sounds: Normal heart sounds.  Pulmonary:  Effort: Pulmonary effort is normal. No respiratory distress.     Breath sounds: Normal breath sounds. No wheezing.  Abdominal:     General: Bowel sounds are normal. There is no distension.     Palpations: Abdomen is soft. There is no mass.     Tenderness: There is no abdominal tenderness. There is no guarding or rebound.  Musculoskeletal:        General: No tenderness. Normal range of motion.     Cervical back: Normal range of motion and neck supple.  Skin:    General: Skin is warm.  Neurological:     Mental Status: She is alert and oriented to person, place, and time.  Psychiatric:        Mood and Affect: Affect normal.    LABORATORY DATA:  I have reviewed the data as listed Lab Results  Component Value Date   WBC 3.5 (L) 07/07/2021   HGB 10.2 (L) 07/07/2021   HCT 30.6 (L) 07/07/2021   MCV 109.3 (H) 07/07/2021   PLT 201 07/07/2021   Recent Labs    05/13/21 1040 06/10/21 0907 07/07/21 1018  NA 136 138 136  K 3.7 3.9 3.6  CL 103 106 102  CO2 '26 27 27  ' GLUCOSE 132* 92 133*  BUN '16 14 14  ' CREATININE 0.90 0.90 1.01*  CALCIUM 8.7* 9.0 8.9  GFRNONAA >60 >60 60*  PROT 7.8 7.9 8.1  ALBUMIN 4.0 4.2 4.1  AST '18  18 21  ' ALT '16 17 17  ' ALKPHOS 55 51 52  BILITOT 0.9 0.9 0.7     No results found.  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 POSITIVE  For SF3B1; R-IPSS-low risk. Poor response to single agent Aranesp. Currently on Aranesp 500 mcg weekly subcu injections; and  lenalidomide 10 mg  3w-ON & 1 w-OFF-tolerating fairly well except for mild GI effects/mild neutropenia. STABLE.    # Hemoglobin is 10.2-HOLD today- Aranesp.  Monitor closely. Continue Revlimid 75m; days-1-21; 7 days OFF. STABLE   # [place] 1[place]; HOLD off dental cleaning at this time. Re-evaluate at next visit- 186monthrom now.  Patient understand that she will probably have to hold off Revlimid for approximately 1 week to avoid neutropenia/and proceed with dental cleaning.  # Diarrhea/dyspepsis-G-1-2; sec to revlimid- monitor for now; discussed regarding use of cholestyramine- /decrease fat intake. STABLE; controlled with immodium prn.   # COVID EVUSHELD PROPHYLAXIS: Given the immunosuppressive effects of treatments I would recommend EVUSHELD prophylaxis.  IM injection x2 [same time]-cutdown risk of Covid infection/next 6 months. [place] Will refer to LaBeckey RutterP who will speak to the patient.  Proceed with flu shot today.  # DISPOSITION:  # refer to lauren re: EVUSHELD # HOLD Aranesp. # in 4 weeks- MD; labs- cbc/Cmp;LDH;  possible; Aranesp SQ- Dr.B  All questions were answered. The patient knows to call the clinic with any problems, questions or concerns.   GoCammie SickleMD 07/07/2021 11:14 AM

## 2021-07-07 NOTE — Progress Notes (Signed)
Pt will like to know if she can have the flu vaccine today.

## 2021-07-22 ENCOUNTER — Other Ambulatory Visit: Payer: Self-pay | Admitting: *Deleted

## 2021-07-22 DIAGNOSIS — D4620 Refractory anemia with excess of blasts, unspecified: Secondary | ICD-10-CM

## 2021-07-24 ENCOUNTER — Encounter: Payer: Self-pay | Admitting: Internal Medicine

## 2021-07-24 MED ORDER — LENALIDOMIDE 10 MG PO CAPS: 10.0000 mg | ORAL_CAPSULE | Freq: Every day | ORAL | 0 refills | Status: DC

## 2021-08-04 ENCOUNTER — Inpatient Hospital Stay: Payer: PPO | Attending: Internal Medicine

## 2021-08-04 ENCOUNTER — Inpatient Hospital Stay (HOSPITAL_BASED_OUTPATIENT_CLINIC_OR_DEPARTMENT_OTHER): Payer: PPO | Admitting: Internal Medicine

## 2021-08-04 ENCOUNTER — Other Ambulatory Visit: Payer: Self-pay

## 2021-08-04 ENCOUNTER — Inpatient Hospital Stay: Payer: PPO

## 2021-08-04 DIAGNOSIS — D4620 Refractory anemia with excess of blasts, unspecified: Secondary | ICD-10-CM

## 2021-08-04 DIAGNOSIS — D461 Refractory anemia with ring sideroblasts: Secondary | ICD-10-CM | POA: Diagnosis not present

## 2021-08-04 LAB — CBC WITH DIFFERENTIAL/PLATELET
Abs Immature Granulocytes: 0.04 10*3/uL (ref 0.00–0.07)
Basophils Absolute: 0.1 10*3/uL (ref 0.0–0.1)
Basophils Relative: 2 %
Eosinophils Absolute: 0.3 10*3/uL (ref 0.0–0.5)
Eosinophils Relative: 7 %
HCT: 28 % — ABNORMAL LOW (ref 36.0–46.0)
Hemoglobin: 9.5 g/dL — ABNORMAL LOW (ref 12.0–15.0)
Immature Granulocytes: 1 %
Lymphocytes Relative: 35 %
Lymphs Abs: 1.5 10*3/uL (ref 0.7–4.0)
MCH: 36.5 pg — ABNORMAL HIGH (ref 26.0–34.0)
MCHC: 33.9 g/dL (ref 30.0–36.0)
MCV: 107.7 fL — ABNORMAL HIGH (ref 80.0–100.0)
Monocytes Absolute: 0.7 10*3/uL (ref 0.1–1.0)
Monocytes Relative: 16 %
Neutro Abs: 1.7 10*3/uL (ref 1.7–7.7)
Neutrophils Relative %: 39 %
Platelets: 226 10*3/uL (ref 150–400)
RBC: 2.6 MIL/uL — ABNORMAL LOW (ref 3.87–5.11)
RDW: 16.5 % — ABNORMAL HIGH (ref 11.5–15.5)
WBC: 4.4 10*3/uL (ref 4.0–10.5)
nRBC: 0.5 % — ABNORMAL HIGH (ref 0.0–0.2)

## 2021-08-04 LAB — COMPREHENSIVE METABOLIC PANEL
ALT: 16 U/L (ref 0–44)
AST: 18 U/L (ref 15–41)
Albumin: 4.1 g/dL (ref 3.5–5.0)
Alkaline Phosphatase: 56 U/L (ref 38–126)
Anion gap: 6 (ref 5–15)
BUN: 15 mg/dL (ref 8–23)
CO2: 28 mmol/L (ref 22–32)
Calcium: 8.7 mg/dL — ABNORMAL LOW (ref 8.9–10.3)
Chloride: 103 mmol/L (ref 98–111)
Creatinine, Ser: 0.87 mg/dL (ref 0.44–1.00)
GFR, Estimated: >60 mL/min (ref >60)
Glucose, Bld: 125 mg/dL — ABNORMAL HIGH (ref 70–99)
Potassium: 3.5 mmol/L (ref 3.5–5.1)
Sodium: 137 mmol/L (ref 135–145)
Total Bilirubin: 0.8 mg/dL (ref 0.3–1.2)
Total Protein: 7.7 g/dL (ref 6.5–8.1)

## 2021-08-04 LAB — LACTATE DEHYDROGENASE: LDH: 99 U/L (ref 98–192)

## 2021-08-04 MED ORDER — DARBEPOETIN ALFA 500 MCG/ML IJ SOSY
500.0000 ug | PREFILLED_SYRINGE | Freq: Once | INTRAMUSCULAR | Status: AC
  Administered 2021-08-04: 500 ug via SUBCUTANEOUS
  Filled 2021-08-04: qty 1

## 2021-08-04 NOTE — Progress Notes (Signed)
Patient states that she has no questions or concerns today

## 2021-08-04 NOTE — Assessment & Plan Note (Addendum)
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 Poor response to single agent Aranesp. Currently on Aranesp 500 mcg weekly subcu injections; and  lenalidomide 10 mg  3w-ON & 1 w-OFF-tolerating fairly well except for mild GI effects/mild neutropenia. STABLE.    # Hemoglobin is 9.5 -proceed with Aranesp.  Monitor closely. Continue Revlimid 6m; days-1-21; 7 days OFF. STABLE.   # ANC today- 1.7.  Patient understand that she will probably have to hold off Revlimid for approximately 1-2 weeks to avoid neutropenia/and proceed with dental cleaning.  Patient call the dentist office to schedule appointment; and we can then manage the Revlimid.  # Diarrhea/dyspepsis-G-1-2; sec to revlimid- monitor for now; discussed regarding use of cholestyramine- /decrease fat intake.  STABLE; controlled with immodium prn.   #Vitamin D deficiency-on vitamin D high-dose weekly.  We will recheck vitamin D levels in 1 month.  # DISPOSITION:  # proceed with Aranesp. # in 4 weeks- MD; labs- cbc/Cmp;LDH; vit D 25 OH level  possible; Aranesp SQ- Dr.B

## 2021-08-04 NOTE — Progress Notes (Signed)
[place] CONSULT NOTE  Patient Care Team: [name], [name], MD as PCP - General Rogue Bussing, [name], MD as Consulting Physician (Hematology and Oncology)  CHIEF COMPLAINTS/PURPOSE OF CONSULTATION: MDS   Oncology History Overview Note   # July 2020-myelodysplastic syndrome with ringed sideroblasts-Normal karyotype; no blasts [IPSS R-Very low risk ~median survival 8.3 years]; iron studies M76 folic acid myeloma panel normal; pyridoxine levels/copper/zinc-WNL. Erythropoietin levels-60. 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  Carla BaehrMay 70 y.o.  female history of low-grade MDS anemia with ring sideroblasts currently on Aranesp/Revlimid is here for follow-up.  Patient is interested in having currently done.  No appointment with dentistry yet.  Denies any fevers or chills.  No nausea no vomiting.  No chest pain or shortness with or cough.  Chronic intermittent mild diarrhea.  Review of Systems  Constitutional:  Positive for malaise/fatigue. Negative for chills, diaphoresis and fever.  HENT:  Negative for nosebleeds and sore throat.   Eyes:  Negative for double vision.  Respiratory:  Negative for cough, hemoptysis, sputum  production, shortness of breath and wheezing.   Cardiovascular:  Negative for chest pain and orthopnea.  Gastrointestinal:  Negative for abdominal pain, blood in stool, constipation, diarrhea, heartburn, melena and vomiting.  Genitourinary:  Negative for dysuria, frequency and urgency.  Musculoskeletal:  Positive for back pain and joint pain.  Skin: Negative.  Negative for itching and rash.  Neurological:  Negative for dizziness, tingling, focal weakness, weakness and headaches.  Endo/Heme/Allergies:  Does not bruise/bleed easily.  Psychiatric/Behavioral:  Negative for depression. The patient is not nervous/anxious and does not have insomnia.    MEDICAL HISTORY:  Past Medical History:  Diagnosis Date   Allergic rhinitis, cause unspecified    Anemia, unspecified    Carpal tunnel syndrome    Cystitis, unspecified    Family history of osteoporosis    PONV (postoperative nausea and vomiting)    Postnasal drip    Syncope and collapse     SURGICAL HISTORY: Past Surgical History:  Procedure Laterality Date   CARPAL TUNNEL RELEASE     COLONOSCOPY  2005   COLONOSCOPY WITH PROPOFOL N/A 07/31/2015   Procedure: COLONOSCOPY WITH PROPOFOL;  Surgeon: RRonald Lobo MD;  Location: [facility];  Service: Endoscopy;  Laterality: N/A;   DIAGNOSTIC LAPAROSCOPY     dx lack of pregnancy    EYE SURGERY     lasik, cataract, prk,yag procedure    SOCIAL HISTORY: Social History   Socioeconomic History   Marital status: Married    Spouse name: Not on file   Number of children: Not on file   Years of education: Not on file   Highest education level: Not on file  Occupational History   Not on file  Tobacco Use   Smoking status: Never   Smokeless tobacco: Never  Vaping Use  Vaping Use: Never used  Substance and Sexual Activity   Alcohol use: Yes    Alcohol/week: 0.0 standard drinks    Comment: Rare   Drug use: No   Sexual activity: Not Currently  Other Topics Concern   Not on file   Social History Narrative   No regular exercise      Drinks lots of [employer]         Social Determinants of Health   Financial Resource Strain: Not on file  Food Insecurity: Not on file  Transportation Needs: Not on file  Physical Activity: Not on file  Stress: Not on file  Social Connections: Not on file  Intimate Partner Violence: Not on file    FAMILY HISTORY: Family History  Problem Relation Age of Onset   Osteoporosis Mother    Stroke Mother    Coronary artery disease Father    Stroke Father 109   Diabetes Other        Aunts and uncles   Breast cancer Paternal Aunt    Breast cancer Maternal Aunt    Arthritis/Rheumatoid Child    Breast cancer Cousin     ALLERGIES:  is allergic to codeine.  MEDICATIONS:  Current Outpatient Medications  Medication Sig Dispense Refill   acetaminophen (TYLENOL) 500 MG tablet Take 500-1,000 mg by mouth every 6 (six) hours as needed for mild pain or headache.     aspirin EC 81 MG tablet Take 81 mg by mouth at bedtime. Swallow whole.      BIOTIN PO Take 1 tablet by mouth 3 (three) times a week.      Darbepoetin Alfa (ARANESP) 500 MCG/ML SOSY injection Inject 500 mcg into the skin See admin instructions. Inject 500 mcg into the skin every week     ergocalciferol (VITAMIN D2) 1.25 MG (50000 UT) capsule Take 1 capsule (50,000 Units total) by mouth once a week. 12 capsule 1   lenalidomide (REVLIMID) 10 MG capsule Take 1 capsule (10 mg total) by mouth daily. 3 weeks-On and 1 week-OFF. 21 capsule 0   levothyroxine (SYNTHROID) 25 MCG tablet TAKE 1 TABLET BY MOUTH ONCE A DAY BEFOREBREAKFAST. 90 tablet 1   loperamide (IMODIUM) 2 MG capsule Take by mouth as needed for diarrhea or loose stools. (Patient not taking: No sig reported)     ondansetron (ZOFRAN ODT) 8 MG disintegrating tablet Take 1 tablet (8 mg total) by mouth every 8 (eight) hours as needed for nausea or vomiting. (Patient not taking: No sig reported) 30 tablet 0   No current  facility-administered medications for this visit.   Facility-Administered Medications Ordered in Other Visits  Medication Dose Route Frequency Provider Last Rate Last Admin   Darbepoetin Alfa (ARANESP) injection 500 mcg  500 mcg Subcutaneous Once Charlaine Dalton R, MD          PHYSICAL EXAMINATION:   Vitals:   08/04/21 1525  BP: (!) 123/59  Pulse: 64  Resp: 18  Temp: 98.1 F (36.7 C)  SpO2: 99%    Filed Weights   08/04/21 1525  Weight: 143 lb (64.9 kg)     Physical Exam HENT:     Head: Normocephalic and atraumatic.     Mouth/Throat:     Pharynx: No oropharyngeal exudate.  Eyes:     Pupils: Pupils are equal, round, and reactive to light.  Cardiovascular:     Rate and Rhythm: Normal rate and regular rhythm.     Pulses: Normal pulses.     Heart sounds: Normal  heart sounds.  Pulmonary:     Effort: Pulmonary effort is normal. No respiratory distress.     Breath sounds: Normal breath sounds. No wheezing.  Abdominal:     General: Bowel sounds are normal. There is no distension.     Palpations: Abdomen is soft. There is no mass.     Tenderness: There is no abdominal tenderness. There is no guarding or rebound.  Musculoskeletal:        General: No tenderness. Normal range of motion.     Cervical back: Normal range of motion and neck supple.  Skin:    General: Skin is warm.  Neurological:     Mental Status: She is alert and oriented to person, place, and time.  Psychiatric:        Mood and Affect: Affect normal.    LABORATORY DATA:  I have reviewed the data as listed Lab Results  Component Value Date   WBC 4.4 08/04/2021   HGB 9.5 (L) 08/04/2021   HCT 28.0 (L) 08/04/2021   MCV 107.7 (H) 08/04/2021   PLT 226 08/04/2021   Recent Labs    06/10/21 0907 07/07/21 1018 08/04/21 1408  NA 138 136 137  K 3.9 3.6 3.5  CL 106 102 103  CO2 '27 27 28  ' GLUCOSE 92 133* 125*  BUN '14 14 15  ' CREATININE 0.90 1.01* 0.87  CALCIUM 9.0 8.9 8.7*  GFRNONAA >60 60* >60   PROT 7.9 8.1 7.7  ALBUMIN 4.2 4.1 4.1  AST '18 21 18  ' ALT '17 17 16  ' ALKPHOS 51 52 56  BILITOT 0.9 0.7 0.8     No results found.  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 POSITIVE  For SF3B1; R-IPSS-low risk. Poor response to single agent Aranesp. Currently on Aranesp 500 mcg weekly subcu injections; and  lenalidomide 10 mg  3w-ON & 1 w-OFF-tolerating fairly well except for mild GI effects/mild neutropenia. STABLE.    # Hemoglobin is 9.5 -proceed with Aranesp.  Monitor closely. Continue Revlimid 40m; days-1-21; 7 days OFF. STABLE.   # ANC today- 1.7.  Patient understand that she will probably have to hold off Revlimid for approximately 1-2 weeks to avoid neutropenia/and proceed with dental cleaning.  Patient call the dentist office to schedule appointment; and we can then manage the Revlimid.  # Diarrhea/dyspepsis-G-1-2; sec to revlimid- monitor for now; discussed regarding use of cholestyramine- /decrease fat intake.  STABLE; controlled with immodium prn.    # DISPOSITION:  # proceed with Aranesp. # in 4 weeks- MD; labs- cbc/Cmp;LDH; vit D 25 OH level  possible; Aranesp SQ- Dr.B  All questions were answered. The patient knows to call the clinic with any problems, questions or concerns.   GCammie Sickle MD 08/04/2021 4:08 PM

## 2021-08-19 ENCOUNTER — Other Ambulatory Visit: Payer: Self-pay | Admitting: *Deleted

## 2021-08-19 DIAGNOSIS — D4620 Refractory anemia with excess of blasts, unspecified: Secondary | ICD-10-CM

## 2021-08-24 ENCOUNTER — Telehealth: Payer: Self-pay | Admitting: Pharmacist

## 2021-08-24 ENCOUNTER — Encounter: Payer: Self-pay | Admitting: Internal Medicine

## 2021-08-24 MED ORDER — LENALIDOMIDE 10 MG PO CAPS: 10.0000 mg | ORAL_CAPSULE | Freq: Every day | ORAL | 0 refills | Status: DC

## 2021-08-24 NOTE — Telephone Encounter (Signed)
Oral Chemotherapy Pharmacist Encounter   [facility] reached out this morning for a Revlimid refill rx. Reviewed Dr. Darlin Priestly note and saw that Carla [name]'s next Revlimid cycle would be pending her dental appt timing. I called Carla [name] to see when her dental appt was scheduled. Her appt was not yet scheduled, but she planned on calling today to get her appt set.   Spoke with Carla [name] this afternoon and she has scheduled her dental appt for 10/01/21. She is scheduled to restart her Revlimid this Thursday 08/27/21. She wanted to check with Dr. Rogue Bussing to make sure this timing was okay.  Start cycle: 08/27/21 Last dose of Revlimid: 09/16/21 (2 weeks later) Dental appt: 10/01/21  Darl Pikes, PharmD, BCPS, BCOP, CPP Hematology/Oncology Clinical Pharmacist [facility] [phone]  08/24/2021 2:11 PM

## 2021-08-25 NOTE — Telephone Encounter (Signed)
Called Ms. Batz back, she will get started on her Revlimid on this Thursday 08/27/21.

## 2021-08-26 ENCOUNTER — Telehealth: Payer: Self-pay | Admitting: Internal Medicine

## 2021-08-26 NOTE — Telephone Encounter (Signed)
Pt called to reschedule her appt. Please call back at [phone]

## 2021-09-01 ENCOUNTER — Ambulatory Visit: Payer: PPO

## 2021-09-01 ENCOUNTER — Other Ambulatory Visit: Payer: PPO

## 2021-09-01 ENCOUNTER — Ambulatory Visit: Payer: PPO | Admitting: Internal Medicine

## 2021-09-10 ENCOUNTER — Other Ambulatory Visit: Payer: Self-pay | Admitting: Nurse Practitioner

## 2021-09-10 ENCOUNTER — Telehealth: Payer: Self-pay | Admitting: Internal Medicine

## 2021-09-10 ENCOUNTER — Telehealth: Payer: Self-pay | Admitting: Oncology

## 2021-09-10 DIAGNOSIS — Z20822 Contact with and (suspected) exposure to covid-19: Secondary | ICD-10-CM | POA: Diagnosis not present

## 2021-09-10 DIAGNOSIS — Z03818 Encounter for observation for suspected exposure to other biological agents ruled out: Secondary | ICD-10-CM | POA: Diagnosis not present

## 2021-09-10 DIAGNOSIS — U071 COVID-19: Secondary | ICD-10-CM

## 2021-09-10 MED ORDER — NIRMATRELVIR/RITONAVIR (PAXLOVID)TABLET: 3.0000 | ORAL_TABLET | Freq: Two times a day (BID) | ORAL | 0 refills | Status: AC

## 2021-09-10 NOTE — Telephone Encounter (Signed)
Pt called in to report that she tested positive for Covid today. Want to know what to do about her chemo? Call back at [phone]

## 2021-09-10 NOTE — Telephone Encounter (Signed)
Patient is wanting to know if she should continue Revlimid with COVID diagnosis.  She previously had COVID and Dr. B advised her to hold Revlimid.    She takes Revlimid 3 weeks on with 1 week off, currently on her 2nd week.  Was treated with Paxlovid with last COVID and would prefer not take again but if it is recommended she will take.

## 2021-09-10 NOTE — Progress Notes (Signed)
Called patient to f/u re: covid positivity and treatment options. No answer. Left vm.

## 2021-09-10 NOTE — Telephone Encounter (Signed)
Outpatient Oral COVID Treatment Note  I connected with Carla [name] on 09/10/2021/5:02 PM by telephone and verified that I am speaking with the correct person using two identifiers.  I discussed the limitations, risks, security, and privacy concerns of performing an evaluation and management service by telephone and the availability of in person appointments. I also discussed with the patient that there Blomquist be a patient responsible charge related to this service. The patient expressed understanding and agreed to proceed.  Patient location: Home Provider location: Clinic   Diagnosis: COVID-19 infection  Purpose of visit: Discussion of potential use of Molnupiravir or Paxlovid, a new treatment for mild to moderate COVID-19 viral infection in non-hospitalized patients.   Subjective: Patient is a 70 y.o. female who has been diagnosed with COVID 19 viral infection.  Their symptoms began on 09/09/21 with Sore throat, cough and runny nose.    Past Medical History:  Diagnosis Date   Allergic rhinitis, cause unspecified    Anemia, unspecified    Carpal tunnel syndrome    Cystitis, unspecified    Family history of osteoporosis    PONV (postoperative nausea and vomiting)    Postnasal drip    Syncope and collapse     Allergies  Allergen Reactions   Codeine Nausea Only     Current Outpatient Medications:    acetaminophen (TYLENOL) 500 MG tablet, Take 500-1,000 mg by mouth every 6 (six) hours as needed for mild pain or headache., Disp: , Rfl:    aspirin EC 81 MG tablet, Take 81 mg by mouth at bedtime. Swallow whole. , Disp: , Rfl:    BIOTIN PO, Take 1 tablet by mouth 3 (three) times a week. , Disp: , Rfl:    Darbepoetin Alfa (ARANESP) 500 MCG/ML SOSY injection, Inject 500 mcg into the skin See admin instructions. Inject 500 mcg into the skin every week, Disp: , Rfl:    ergocalciferol (VITAMIN D2) 1.25 MG (50000 UT) capsule, Take 1 capsule (50,000 Units total) by mouth once a week., Disp: 12  capsule, Rfl: 1   lenalidomide (REVLIMID) 10 MG capsule, Take 1 capsule (10 mg total) by mouth daily. 3 weeks-On and 1 week-OFF., Disp: 21 capsule, Rfl: 0   levothyroxine (SYNTHROID) 25 MCG tablet, TAKE 1 TABLET BY MOUTH ONCE A DAY BEFOREBREAKFAST., Disp: 90 tablet, Rfl: 1   loperamide (IMODIUM) 2 MG capsule, Take by mouth as needed for diarrhea or loose stools. (Patient not taking: No sig reported), Disp: , Rfl:    ondansetron (ZOFRAN ODT) 8 MG disintegrating tablet, Take 1 tablet (8 mg total) by mouth every 8 (eight) hours as needed for nausea or vomiting. (Patient not taking: No sig reported), Disp: 30 tablet, Rfl: 0  Objective: Patient sounds Well.  They are in no apparent distress.  Breathing is non labored.  Mood and behavior are normal.  Laboratory Data:  Recent Results (from the past 2160 hour(s))  Lactate dehydrogenase     Status: Abnormal   Collection Time: 07/07/21 10:18 AM  Result Value Ref Range   LDH 95 (L) 98 - 192 U/L    Comment: Performed at [facility], [address] [place], [place] [address]  Comprehensive metabolic panel     Status: Abnormal   Collection Time: 07/07/21 10:18 AM  Result Value Ref Range   Sodium 136 135 - 145 mmol/L   Potassium 3.6 3.5 - 5.1 mmol/L   Chloride 102 98 - 111 mmol/L   CO2 27 22 - 32 mmol/L  Glucose, Bld 133 (H) 70 - 99 mg/dL    Comment: Glucose reference range applies only to samples taken after fasting for at least 8 hours.   BUN 14 8 - 23 mg/dL   Creatinine, Ser 1.01 (H) 0.44 - 1.00 mg/dL   Calcium 8.9 8.9 - 10.3 mg/dL   Total Protein 8.1 6.5 - 8.1 g/dL   Albumin 4.1 3.5 - 5.0 g/dL   AST 21 15 - 41 U/L   ALT 17 0 - 44 U/L   Alkaline Phosphatase 52 38 - 126 U/L   Total Bilirubin 0.7 0.3 - 1.2 mg/dL   GFR, Estimated 60 (L) >60 mL/min    Comment: (NOTE) Calculated using the CKD-EPI Creatinine Equation (2021)    Anion gap 7 5 - 15    Comment: Performed at [facility], [address] [place], [place] [address]   CBC with Differential     Status: Abnormal   Collection Time: 07/07/21 10:18 AM  Result Value Ref Range   WBC 3.5 (L) 4.0 - 10.5 K/uL   RBC 2.80 (L) 3.87 - 5.11 MIL/uL   Hemoglobin 10.2 (L) 12.0 - 15.0 g/dL   HCT 30.6 (L) 36.0 - 46.0 %   MCV 109.3 (H) 80.0 - 100.0 fL   MCH 36.4 (H) 26.0 - 34.0 pg   MCHC 33.3 30.0 - 36.0 g/dL   RDW 16.2 (H) 11.5 - 15.5 %   Platelets 201 150 - 400 K/uL   nRBC 0.0 0.0 - 0.2 %   Neutrophils Relative % 39 %   Neutro Abs 1.4 (L) 1.7 - 7.7 K/uL   Lymphocytes Relative 33 %   Lymphs Abs 1.1 0.7 - 4.0 K/uL   Monocytes Relative 16 %   Monocytes Absolute 0.6 0.1 - 1.0 K/uL   Eosinophils Relative 8 %   Eosinophils Absolute 0.3 0.0 - 0.5 K/uL   Basophils Relative 3 %   Basophils Absolute 0.1 0.0 - 0.1 K/uL   Immature Granulocytes 1 %   Abs Immature Granulocytes 0.03 0.00 - 0.07 K/uL    Comment: Performed at [facility], [address] [place], [place] [address]  Lactate dehydrogenase     Status: None   Collection Time: 08/04/21  2:08 PM  Result Value Ref Range   LDH 99 98 - 192 U/L    Comment: Performed at [facility], [address] [place], [place] [address]  Comprehensive metabolic panel     Status: Abnormal   Collection Time: 08/04/21  2:08 PM  Result Value Ref Range   Sodium 137 135 - 145 mmol/L   Potassium 3.5 3.5 - 5.1 mmol/L   Chloride 103 98 - 111 mmol/L   CO2 28 22 - 32 mmol/L   Glucose, Bld 125 (H) 70 - 99 mg/dL    Comment: Glucose reference range applies only to samples taken after fasting for at least 8 hours.   BUN 15 8 - 23 mg/dL   Creatinine, Ser 0.87 0.44 - 1.00 mg/dL   Calcium 8.7 (L) 8.9 - 10.3 mg/dL   Total Protein 7.7 6.5 - 8.1 g/dL   Albumin 4.1 3.5 - 5.0 g/dL   AST 18 15 - 41 U/L   ALT 16 0 - 44 U/L   Alkaline Phosphatase 56 38 - 126 U/L   Total Bilirubin 0.8 0.3 - 1.2 mg/dL   GFR, Estimated >60 >60 mL/min    Comment: (NOTE) Calculated using the CKD-EPI Creatinine Equation (2021)    Anion gap 6  5 - 15     Comment: Performed at [facility], [address] [place], [place] [address]  CBC with Differential/Platelet     Status: Abnormal   Collection Time: 08/04/21  2:08 PM  Result Value Ref Range   WBC 4.4 4.0 - 10.5 K/uL   RBC 2.60 (L) 3.87 - 5.11 MIL/uL   Hemoglobin 9.5 (L) 12.0 - 15.0 g/dL   HCT 28.0 (L) 36.0 - 46.0 %   MCV 107.7 (H) 80.0 - 100.0 fL   MCH 36.5 (H) 26.0 - 34.0 pg   MCHC 33.9 30.0 - 36.0 g/dL   RDW 16.5 (H) 11.5 - 15.5 %   Platelets 226 150 - 400 K/uL   nRBC 0.5 (H) 0.0 - 0.2 %   Neutrophils Relative % 39 %   Neutro Abs 1.7 1.7 - 7.7 K/uL   Lymphocytes Relative 35 %   Lymphs Abs 1.5 0.7 - 4.0 K/uL   Monocytes Relative 16 %   Monocytes Absolute 0.7 0.1 - 1.0 K/uL   Eosinophils Relative 7 %   Eosinophils Absolute 0.3 0.0 - 0.5 K/uL   Basophils Relative 2 %   Basophils Absolute 0.1 0.0 - 0.1 K/uL   Immature Granulocytes 1 %   Abs Immature Granulocytes 0.04 0.00 - 0.07 K/uL    Comment: Performed at [facility], [address] [place], [place] [address]     Assessment: 70 y.o. female with mild/moderate COVID 19 viral infection diagnosed on 09/10/21 at high risk for progression to severe COVID 19.  Plan:  This patient is a 70 y.o. female that meets the following criteria for Emergency Use Authorization of: Paxlovid 1. Age >12 yr AND > 40 kg 2. SARS-COV-2 positive test 3. Symptom onset < 5 days 4. Mild-to-moderate COVID disease with high risk for severe progression to hospitalization or death  I have spoken and communicated the following to the patient or parent/caregiver regarding: Paxlovid is an unapproved drug that is authorized for use under an Emergency Use Authorization.  There are no adequate, approved, available products for the treatment of COVID-19 in adults who have mild-to-moderate COVID-19 and are at high risk for progressing to severe COVID-19, including hospitalization or death. Other therapeutics are currently authorized. For  additional information on all products authorized for treatment or prevention of COVID-19, please see [employer].  There are benefits and risks of taking this treatment as outlined in the Fact Sheet for Patients and Caregivers.  Fact Sheet for Patients and Caregivers was reviewed with patient. A hard copy will be provided to patient from pharmacy prior to the patient receiving treatment. Patients should continue to self-isolate and use infection control measures (e.g., wear mask, isolate, social distance, avoid sharing personal items, clean and disinfect high touch surfaces, and frequent handwashing) according to CDC guidelines.  The patient or parent/caregiver has the option to accept or refuse treatment. Patient medication history was reviewed for potential drug interactions:No drug interactions Patients GFR was calculated to be > 60, and they were therefore prescribed Normal dose (GFR>60) - nirmatrelvir 150mg  tab (2 tablet) by mouth twice daily AND ritonavir 100mg  tab (1 tablet) by mouth twice daily  After reviewing above information with the patient, the patient agrees to receive Paxlovid.  Follow up instructions:    Take prescription BID x 5 days as directed Reach out to pharmacist for counseling on medication if desired For concerns regarding further COVID symptoms please follow up with your PCP or urgent care For urgent or life-threatening issues,  seek care at your local emergency department  The patient was provided an opportunity to ask questions, and all were answered. The patient agreed with the plan and demonstrated an understanding of the instructions.   The patient was advised to call their PCP or seek an in-person evaluation if the symptoms worsen or if the condition fails to improve as anticipated.   I provided 10 minutes of non face-to-face telephone visit time  during this encounter, and > 50% was spent counseling as documented under my assessment & plan.  Jacquelin Hawking, NP 09/10/2021 Peter Garter PM

## 2021-09-11 ENCOUNTER — Inpatient Hospital Stay (HOSPITAL_BASED_OUTPATIENT_CLINIC_OR_DEPARTMENT_OTHER): Payer: PPO | Admitting: Nurse Practitioner

## 2021-09-11 DIAGNOSIS — D4620 Refractory anemia with excess of blasts, unspecified: Secondary | ICD-10-CM

## 2021-09-11 NOTE — Progress Notes (Signed)
Pt states she was unaware of video visit but will attempt to connect to video. Says she has poor reception and connectivity issues at last attempted video visit.

## 2021-09-16 ENCOUNTER — Other Ambulatory Visit: Payer: Self-pay | Admitting: *Deleted

## 2021-09-16 ENCOUNTER — Telehealth: Payer: Self-pay

## 2021-09-16 DIAGNOSIS — D4620 Refractory anemia with excess of blasts, unspecified: Secondary | ICD-10-CM

## 2021-09-16 MED ORDER — LENALIDOMIDE 10 MG PO CAPS: 10.0000 mg | ORAL_CAPSULE | Freq: Every day | ORAL | 0 refills | Status: DC

## 2021-09-16 NOTE — Telephone Encounter (Signed)
Called [facility] to confirm Revelmid Rx is complete and give authorization number #[id number]. Per [facility] everything is clear on our end, patient just needs to complete survey. Phone number given to patient. Patient notified and aware, verbalizes understanding. °

## 2021-09-16 NOTE — Telephone Encounter (Signed)
See previous message, [facility] # correction [id number]

## 2021-09-16 NOTE — Telephone Encounter (Signed)
AUTH# [id number].

## 2021-09-17 DIAGNOSIS — Z03818 Encounter for observation for suspected exposure to other biological agents ruled out: Secondary | ICD-10-CM | POA: Diagnosis not present

## 2021-09-17 DIAGNOSIS — Z20822 Contact with and (suspected) exposure to covid-19: Secondary | ICD-10-CM | POA: Diagnosis not present

## 2021-09-28 ENCOUNTER — Other Ambulatory Visit: Payer: Self-pay | Admitting: Family Medicine

## 2021-09-29 ENCOUNTER — Inpatient Hospital Stay: Payer: PPO

## 2021-09-29 ENCOUNTER — Other Ambulatory Visit: Payer: Self-pay

## 2021-09-29 ENCOUNTER — Inpatient Hospital Stay: Payer: PPO | Attending: Internal Medicine

## 2021-09-29 ENCOUNTER — Inpatient Hospital Stay (HOSPITAL_BASED_OUTPATIENT_CLINIC_OR_DEPARTMENT_OTHER): Payer: PPO | Admitting: Internal Medicine

## 2021-09-29 DIAGNOSIS — D4620 Refractory anemia with excess of blasts, unspecified: Secondary | ICD-10-CM

## 2021-09-29 DIAGNOSIS — R197 Diarrhea, unspecified: Secondary | ICD-10-CM | POA: Insufficient documentation

## 2021-09-29 DIAGNOSIS — M25511 Pain in right shoulder: Secondary | ICD-10-CM | POA: Insufficient documentation

## 2021-09-29 DIAGNOSIS — E559 Vitamin D deficiency, unspecified: Secondary | ICD-10-CM | POA: Insufficient documentation

## 2021-09-29 DIAGNOSIS — Z803 Family history of malignant neoplasm of breast: Secondary | ICD-10-CM | POA: Insufficient documentation

## 2021-09-29 DIAGNOSIS — D461 Refractory anemia with ring sideroblasts: Secondary | ICD-10-CM | POA: Insufficient documentation

## 2021-09-29 LAB — COMPREHENSIVE METABOLIC PANEL
ALT: 17 U/L (ref 0–44)
AST: 21 U/L (ref 15–41)
Albumin: 4.1 g/dL (ref 3.5–5.0)
Alkaline Phosphatase: 61 U/L (ref 38–126)
Anion gap: 9 (ref 5–15)
BUN: 18 mg/dL (ref 8–23)
CO2: 25 mmol/L (ref 22–32)
Calcium: 8.9 mg/dL (ref 8.9–10.3)
Chloride: 102 mmol/L (ref 98–111)
Creatinine, Ser: 1.03 mg/dL — ABNORMAL HIGH (ref 0.44–1.00)
GFR, Estimated: 58 mL/min — ABNORMAL LOW (ref >60)
Glucose, Bld: 154 mg/dL — ABNORMAL HIGH (ref 70–99)
Potassium: 3.9 mmol/L (ref 3.5–5.1)
Sodium: 136 mmol/L (ref 135–145)
Total Bilirubin: 0.5 mg/dL (ref 0.3–1.2)
Total Protein: 8.2 g/dL — ABNORMAL HIGH (ref 6.5–8.1)

## 2021-09-29 LAB — CBC WITH DIFFERENTIAL/PLATELET
Abs Immature Granulocytes: 0.13 10*3/uL — ABNORMAL HIGH (ref 0.00–0.07)
Basophils Absolute: 0.1 10*3/uL (ref 0.0–0.1)
Basophils Relative: 2 %
Eosinophils Absolute: 0.2 10*3/uL (ref 0.0–0.5)
Eosinophils Relative: 3 %
HCT: 27.7 % — ABNORMAL LOW (ref 36.0–46.0)
Hemoglobin: 9.1 g/dL — ABNORMAL LOW (ref 12.0–15.0)
Immature Granulocytes: 3 %
Lymphocytes Relative: 33 %
Lymphs Abs: 1.6 10*3/uL (ref 0.7–4.0)
MCH: 36.1 pg — ABNORMAL HIGH (ref 26.0–34.0)
MCHC: 32.9 g/dL (ref 30.0–36.0)
MCV: 109.9 fL — ABNORMAL HIGH (ref 80.0–100.0)
Monocytes Absolute: 0.6 10*3/uL (ref 0.1–1.0)
Monocytes Relative: 13 %
Neutro Abs: 2.2 10*3/uL (ref 1.7–7.7)
Neutrophils Relative %: 46 %
Platelets: 313 10*3/uL (ref 150–400)
RBC: 2.52 MIL/uL — ABNORMAL LOW (ref 3.87–5.11)
RDW: 17 % — ABNORMAL HIGH (ref 11.5–15.5)
WBC: 4.9 10*3/uL (ref 4.0–10.5)
nRBC: 1.2 % — ABNORMAL HIGH (ref 0.0–0.2)

## 2021-09-29 LAB — LACTATE DEHYDROGENASE: LDH: 113 U/L (ref 98–192)

## 2021-09-29 MED ORDER — DARBEPOETIN ALFA 500 MCG/ML IJ SOSY
500.0000 ug | PREFILLED_SYRINGE | Freq: Once | INTRAMUSCULAR | Status: AC
  Administered 2021-09-29: 500 ug via SUBCUTANEOUS
  Filled 2021-09-29: qty 1

## 2021-09-29 NOTE — Assessment & Plan Note (Addendum)
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 . Currently on Aranesp 500 mcg weekly subcu injections; and  lenalidomide 10 mg  3w-ON & 1 w-OFF-tolerating fairly well except for mild GI effects/mild neutropenia. STABLE.    # Hemoglobin is 9.1 -proceed with Aranesp.  Monitor closely. Continue Revlimid 73m; days-1-21; 7 days OFF-start on Jan 5th, 2022/after dental cleaning-. STABLE.   # ANC today- 2.2; plan dental cleanong of jan 5th- start revelimid after the cleaning  # Diarrhea/dyspepsis-G-1-2; sec to revlimid- monitor for now; discussed regarding use of cholestyramine- /decrease fat intake.  STABLE; controlled with immodium prn.   #Vitamin D deficiency- STOP vitamin D high-dose weekly; proceed with ca 600+vit D-800. BID.   # COVID re-infection- x2- s/p paxlovid- evusheld; refer to [facility]   # Right shoulder pain/radiating to Right UE- [several weeks]; wants to hold off X-ray- continue ice/ heat for stable. Continue prn NSAIDs.   # DISPOSITION:  # refer to Lauren re: evusheld-/telephone visit # proceed with Aranesp. # in 4 weeks- MD; labs- cbc/Cmp;LDH; possible; Aranesp SQ- Dr.B

## 2021-09-29 NOTE — Progress Notes (Signed)
[facility] CONSULT NOTE  Patient Care Team: [name], [name], MD as PCP - General Rogue Bussing, [name], MD as Consulting Physician (Hematology and Oncology)  CHIEF COMPLAINTS/PURPOSE OF CONSULTATION: MDS   Oncology History Overview Note   # July 2020-myelodysplastic syndrome with ringed sideroblasts-Normal karyotype; no blasts [IPSS R-Very low risk ~median survival 8.3 years]; iron studies M21 folic acid myeloma panel normal; pyridoxine levels/copper/zinc-WNL. Erythropoietin levels-60. 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  LTreyana SturgellMay 71 y.o.  [name] history of low-grade MDS anemia with ring sideroblasts currently on Aranesp/Revlimid is here for follow-up.  In the interim patient diagnosed with COVID in December 2022.  Revlimid on hold.  S/p Paxlovid.   Patient is awaiting dental cleaning on January 5th.  Currently Revlimid is on hold.  Denies any fevers or chills.  No nausea no vomiting.  No chest pain or shortness with or cough.  Chronic intermittent mild diarrhea.  Patient complains of right shoulder pain for the last 2 to 3 weeks.  Review of Systems  Constitutional:  Positive for malaise/fatigue. Negative for chills,  diaphoresis and fever.  HENT:  Negative for nosebleeds and sore throat.   Eyes:  Negative for double vision.  Respiratory:  Negative for cough, hemoptysis, sputum production, shortness of breath and wheezing.   Cardiovascular:  Negative for chest pain and orthopnea.  Gastrointestinal:  Negative for abdominal pain, blood in stool, constipation, diarrhea, heartburn, melena and vomiting.  Genitourinary:  Negative for dysuria, frequency and urgency.  Musculoskeletal:  Positive for back pain and joint pain.  Skin: Negative.  Negative for itching and rash.  Neurological:  Negative for dizziness, tingling, focal weakness, weakness and headaches.  Endo/Heme/Allergies:  Does not bruise/bleed easily.  Psychiatric/Behavioral:  Negative for depression. The patient is not nervous/anxious and does not have insomnia.    MEDICAL HISTORY:  Past Medical History:  Diagnosis Date   Allergic rhinitis, cause unspecified    Anemia, unspecified    Carpal tunnel syndrome    Cystitis, unspecified    Family history of osteoporosis    PONV (postoperative nausea and vomiting)    Postnasal drip    Syncope and collapse     SURGICAL HISTORY: Past Surgical History:  Procedure Laterality Date   CARPAL TUNNEL RELEASE     COLONOSCOPY  2005   COLONOSCOPY WITH PROPOFOL N/A 07/31/2015   Procedure: COLONOSCOPY WITH PROPOFOL;  Surgeon: RRonald Lobo MD;  Location: [facility];  Service: Endoscopy;  Laterality: N/A;   DIAGNOSTIC LAPAROSCOPY     dx lack of pregnancy    EYE SURGERY     lasik, cataract, prk,yag procedure    SOCIAL HISTORY: Social History   Socioeconomic History   Marital status: Married    Spouse name: Not on file   Number of children: Not on file   Years of education: Not on file  Highest education level: Not on file  Occupational History   Not on file  Tobacco Use   Smoking status: Never   Smokeless tobacco: Never  Vaping Use   Vaping Use: Never used  Substance and Sexual Activity    Alcohol use: Yes    Alcohol/week: 0.0 standard drinks    Comment: Rare   Drug use: No   Sexual activity: Not Currently  Other Topics Concern   Not on file  Social History Narrative   No regular exercise      Drinks lots of Pepsi         Social Determinants of Health   Financial Resource Strain: Not on file  Food Insecurity: Not on file  Transportation Needs: Not on file  Physical Activity: Not on file  Stress: Not on file  Social Connections: Not on file  Intimate Partner Violence: Not on file    FAMILY HISTORY: Family History  Problem Relation Age of Onset   Osteoporosis Mother    Stroke Mother    Coronary artery disease Father    Stroke Father 59   Diabetes Other        Aunts and uncles   Breast cancer Paternal Aunt    Breast cancer Maternal Aunt    Arthritis/Rheumatoid Child    Breast cancer Cousin     ALLERGIES:  is allergic to codeine.  MEDICATIONS:  Current Outpatient Medications  Medication Sig Dispense Refill   acetaminophen (TYLENOL) 500 MG tablet Take 500-1,000 mg by mouth every 6 (six) hours as needed for mild pain or headache.     aspirin EC 81 MG tablet Take 81 mg by mouth at bedtime. Swallow whole.      Darbepoetin Alfa (ARANESP) 500 MCG/ML SOSY injection Inject 500 mcg into the skin See admin instructions. Inject 500 mcg into the skin every week     ergocalciferol (VITAMIN D2) 1.25 MG (50000 UT) capsule Take 1 capsule (50,000 Units total) by mouth once a week. 12 capsule 1   lenalidomide (REVLIMID) 10 MG capsule Take 1 capsule (10 mg total) by mouth daily. 3 weeks-On and 1 week-OFF. 21 capsule 0   levothyroxine (SYNTHROID) 25 MCG tablet TAKE 1 TABLET BY MOUTH ONCE A DAY BEFOREBREAKFAST. 90 tablet 1   BIOTIN PO Take 1 tablet by mouth 3 (three) times a week.  (Patient not taking: Reported on 09/29/2021)     loperamide (IMODIUM) 2 MG capsule Take by mouth as needed for diarrhea or loose stools. (Patient not taking: Reported on 07/07/2021)     ondansetron  (ZOFRAN ODT) 8 MG disintegrating tablet Take 1 tablet (8 mg total) by mouth every 8 (eight) hours as needed for nausea or vomiting. (Patient not taking: Reported on 07/07/2021) 30 tablet 0   No current facility-administered medications for this visit.      PHYSICAL EXAMINATION:   Vitals:   09/29/21 0959  BP: 125/64  Pulse: 66  Temp: (!) 97.4 F (36.3 C)  SpO2: (!) 16%    Filed Weights   09/29/21 0959  Weight: 151 lb 3.2 oz (68.6 kg)     Physical Exam HENT:     Head: Normocephalic and atraumatic.     Mouth/Throat:     Pharynx: No oropharyngeal exudate.  Eyes:     Pupils: Pupils are equal, round, and reactive to light.  Cardiovascular:     Rate and Rhythm: Normal rate and regular rhythm.     Pulses: Normal pulses.     Heart sounds: Normal heart  sounds.  Pulmonary:     Effort: Pulmonary effort is normal. No respiratory distress.     Breath sounds: Normal breath sounds. No wheezing.  Abdominal:     General: Bowel sounds are normal. There is no distension.     Palpations: Abdomen is soft. There is no mass.     Tenderness: There is no abdominal tenderness. There is no guarding or rebound.  Musculoskeletal:        General: No tenderness. Normal range of motion.     Cervical back: Normal range of motion and neck supple.  Skin:    General: Skin is warm.  Neurological:     Mental Status: She is alert and oriented to person, place, and time.  Psychiatric:        Mood and Affect: Affect normal.    LABORATORY DATA:  I have reviewed the data as listed Lab Results  Component Value Date   WBC 4.9 09/29/2021   HGB 9.1 (L) 09/29/2021   HCT 27.7 (L) 09/29/2021   MCV 109.9 (H) 09/29/2021   PLT 313 09/29/2021   Recent Labs    07/07/21 1018 08/04/21 1408 09/29/21 0944  NA 136 137 136  K 3.6 3.5 3.9  CL 102 103 102  CO2 '27 28 25  ' GLUCOSE 133* 125* 154*  BUN '14 15 18  ' CREATININE 1.01* 0.87 1.03*  CALCIUM 8.9 8.7* 8.9  GFRNONAA 60* >60 58*  PROT 8.1 7.7 8.2*   ALBUMIN 4.1 4.1 4.1  AST '21 18 21  ' ALT '17 16 17  ' ALKPHOS 52 56 61  BILITOT 0.7 0.8 0.5     No results found.  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 POSITIVE  For SF3B1; R-IPSS-low risk. . Currently on Aranesp 500 mcg weekly subcu injections; and  lenalidomide 10 mg  3w-ON & 1 w-OFF-tolerating fairly well except for mild GI effects/mild neutropenia. STABLE.    # Hemoglobin is 9.1 -proceed with Aranesp.  Monitor closely. Continue Revlimid 33m; days-1-21; 7 days OFF-start on Jan 5th, 2022/after dental cleaning-. STABLE.   # ANC today- 2.2; plan dental cleanong of jan 5th- start revelimid after the cleaning  # Diarrhea/dyspepsis-G-1-2; sec to revlimid- monitor for now; discussed regarding use of cholestyramine- /decrease fat intake.  STABLE; controlled with immodium prn.   #Vitamin D deficiency- STOP vitamin D high-dose weekly; proceed with ca 600+vit D-800. BID.   # COVID re-infection- x2- s/p paxlovid- evusheld; refer to [facility]   # Right shoulder pain/radiating to Right UE- [several weeks]; wants to hold off X-ray- continue ice/ heat for stable. Continue prn NSAIDs.   # DISPOSITION:  # refer to Lauren re: evusheld-/telephone visit # proceed with Aranesp. # in 4 weeks- MD; labs- cbc/Cmp;LDH; possible; Aranesp SQ- Dr.B  All questions were answered. The patient knows to call the clinic with any problems, questions or concerns.   GCammie Sickle MD 09/29/2021 12:40 PM

## 2021-09-29 NOTE — Progress Notes (Signed)
Patient has right shoulder pain/sorenes for several weeks.  Does have an occasional tingling sensation that radiates down arm.  Also reports episodes feeling of heart flutters.

## 2021-10-01 ENCOUNTER — Encounter: Payer: Self-pay | Admitting: Nurse Practitioner

## 2021-10-01 ENCOUNTER — Inpatient Hospital Stay (HOSPITAL_BASED_OUTPATIENT_CLINIC_OR_DEPARTMENT_OTHER): Payer: PPO | Admitting: Nurse Practitioner

## 2021-10-01 DIAGNOSIS — D4620 Refractory anemia with excess of blasts, unspecified: Secondary | ICD-10-CM

## 2021-10-01 DIAGNOSIS — D849 Immunodeficiency, unspecified: Secondary | ICD-10-CM | POA: Diagnosis not present

## 2021-10-01 NOTE — Progress Notes (Signed)
[facility] Note for Evusheld   I connected with Marielys Trinidad Debruin on 10/01/21 at  3:30 PM EST by telephone visit and verified that I am speaking with the correct person using two identifiers.   I discussed the limitations, risks, security and privacy concerns of performing an evaluation and management service by telemedicine and the availability of in-person appointments. I also discussed with the patient that there [place] be a patient responsible charge related to this service. The patient expressed understanding and agreed to proceed.   Other persons participating in the visit and their role in the encounter: none   Patients location: home  Providers location: clinic  Chief Complaint: Evusheld     Patient Care Team: [name], [name], MD as PCP - General Cammie Sickle, MD as Consulting Physician (Hematology and Oncology)   Name of the patient: Carla [name]  [id number]  Jun 30, 1951   Date of visit: 10/01/21  History of Presenting Illness- Patient present for evaluation and consideration of Evusheld. Patient is considered moderately to severely immunocompromised based on active cancer treatment or active cancer, history of organ transplant, having received a stem cell transplant within the last 2 years and taking immunosuppression, or active treatment with immunosuppressing medications.   Review of systems- Review of Systems  Constitutional:  Negative for chills, diaphoresis, fever, malaise/fatigue and weight loss.  HENT:  Negative for congestion, ear discharge, ear pain, nosebleeds, sinus pain and sore throat.   Eyes:  Negative for pain and redness.  Respiratory:  Negative for cough, hemoptysis, sputum production, shortness of breath, wheezing and stridor.   Cardiovascular:  Negative for chest pain, palpitations and leg swelling.  Gastrointestinal:  Negative for abdominal pain, constipation, diarrhea, nausea and vomiting.  Musculoskeletal:   Negative for falls, joint pain and myalgias.  Skin:  Negative for rash.  Neurological:  Negative for dizziness, loss of consciousness, weakness and headaches.  Psychiatric/Behavioral:  Negative for depression. The patient is not nervous/anxious and does not have insomnia.   All other systems reviewed and are negative.   Allergies  Allergen Reactions   Codeine Nausea Only    Past Medical History:  Diagnosis Date   Allergic rhinitis, cause unspecified    Anemia, unspecified    Carpal tunnel syndrome    Cystitis, unspecified    Family history of osteoporosis    PONV (postoperative nausea and vomiting)    Postnasal drip    Syncope and collapse     Past Surgical History:  Procedure Laterality Date   CARPAL TUNNEL RELEASE     COLONOSCOPY  2005   COLONOSCOPY WITH PROPOFOL N/A 07/31/2015   Procedure: COLONOSCOPY WITH PROPOFOL;  Surgeon: Ronald Lobo, MD;  Location: [facility];  Service: Endoscopy;  Laterality: N/A;   DIAGNOSTIC LAPAROSCOPY     dx lack of pregnancy    EYE SURGERY     lasik, cataract, prk,yag procedure    Social History   Socioeconomic History   Marital status: Married    Spouse name: Not on file   Number of children: Not on file   Years of education: Not on file   Highest education level: Not on file  Occupational History   Not on file  Tobacco Use   Smoking status: Never   Smokeless tobacco: Never  Vaping Use   Vaping Use: Never used  Substance and Sexual Activity   Alcohol use: Yes    Alcohol/week: 0.0 standard drinks    Comment: Rare  Drug use: No   Sexual activity: Not Currently  Other Topics Concern   Not on file  Social History Narrative   No regular exercise      Drinks lots of Pepsi         Social Determinants of Health   Financial Resource Strain: Not on file  Food Insecurity: Not on file  Transportation Needs: Not on file  Physical Activity: Not on file  Stress: Not on file  Social Connections: Not on file  Intimate  Partner Violence: Not on file    Immunization History  Administered Date(s) Administered   Fluad Quad(high Dose 65+) 07/05/2019, 06/24/2020, 07/07/2021   Influenza Split 06/22/2011   Influenza Whole 06/26/2008, 07/15/2009, 08/17/2012   Influenza, High Dose Seasonal PF 07/13/2015, 06/27/2017   Influenza,inj,Quad PF,6+ Mos 07/18/2013   Influenza-Unspecified 07/11/2014, 07/09/2016   PFIZER(Purple Top)SARS-COV-2 Vaccination 11/03/2019, 11/24/2019   Pneumococcal Conjugate-13 10/10/2015   Pneumococcal Polysaccharide-23 12/14/2016   Tdap 06/22/2011   Zoster Recombinat (Shingrix) 07/31/2019, 12/19/2019   Zoster, Live 07/05/2012    Family History  Problem Relation Age of Onset   Osteoporosis Mother    Stroke Mother    Coronary artery disease Father    Stroke Father 45   Diabetes Other        Aunts and uncles   Breast cancer Paternal Aunt    Breast cancer Maternal Aunt    Arthritis/Rheumatoid Child    Breast cancer Cousin      Current Outpatient Medications:    acetaminophen (TYLENOL) 500 MG tablet, Take 500-1,000 mg by mouth every 6 (six) hours as needed for mild pain or headache., Disp: , Rfl:    aspirin EC 81 MG tablet, Take 81 mg by mouth at bedtime. Swallow whole. , Disp: , Rfl:    Darbepoetin Alfa (ARANESP) 500 MCG/ML SOSY injection, Inject 500 mcg into the skin See admin instructions. Inject 500 mcg into the skin every week, Disp: , Rfl:    ergocalciferol (VITAMIN D2) 1.25 MG (50000 UT) capsule, Take 1 capsule (50,000 Units total) by mouth once a week., Disp: 12 capsule, Rfl: 1   lenalidomide (REVLIMID) 10 MG capsule, Take 1 capsule (10 mg total) by mouth daily. 3 weeks-On and 1 week-OFF., Disp: 21 capsule, Rfl: 0   levothyroxine (SYNTHROID) 25 MCG tablet, TAKE 1 TABLET BY MOUTH ONCE A DAY BEFOREBREAKFAST., Disp: 90 tablet, Rfl: 1   BIOTIN PO, Take 1 tablet by mouth 3 (three) times a week.  (Patient not taking: Reported on 09/29/2021), Disp: , Rfl:    loperamide (IMODIUM) 2 MG  capsule, Take by mouth as needed for diarrhea or loose stools. (Patient not taking: Reported on 07/07/2021), Disp: , Rfl:    ondansetron (ZOFRAN ODT) 8 MG disintegrating tablet, Take 1 tablet (8 mg total) by mouth every 8 (eight) hours as needed for nausea or vomiting. (Patient not taking: Reported on 07/07/2021), Disp: 30 tablet, Rfl: 0  Physical exam: Exam limited due to telemedicine Physical Exam Pulmonary:     Effort: No respiratory distress.  Neurological:     Mental Status: She is alert.      Assessment and plan- Patient is a 71 y.o. female who presents to consideration of Evusheld.   We discussed that patient was referred to receive Evusheld (Tixagevimab and Cilgavimab). The FDA has issued an emergency use authorization (EUA) for this injection. Evusheld is a pre-exposure prevention of COVID-19 infection in adults who are at high risk for severe disease. Today we reviewed patient's risk and eligibility for  Evusheld. We discussed that consent is voluntary and patient can refuse at any time. An FDA fact sheet will be given to patient prior to injection. We reviewed potential risks, benefits, and alternatives today. We reviewed that some risks and benefits are unknown. Patient verbalizes consent and wishes to proceed.   Patient's husband has tested positive for covid within last 2 days. She is asymptomatic. She was positive for covid and treated with paxlovid approximately 1 month ago. If she develops symptoms she will notify clinic for consideration of treatment.   Advised not to receive a covid vaccine until 2 weeks after receiving Evusheld. Advised that patient should receive Evusheld every 6 months. Advised that receiving Evusheld is not a guarantee in the prevention of getting Covid-19 and that patient should still follow other infection prevention measures.   Plan for evusheld in 1 week 6 months- virtual visit for assessment and consideration of evusheld #2.   Visit Diagnosis No  diagnosis found.  I discussed the assessment and treatment plan with the patient. The patient was provided an opportunity to ask questions and all were answered. The patient agreed with the plan and demonstrated an understanding of the instructions.   The patient was advised to call back or seek an in-person evaluation if the symptoms worsen or if the condition fails to improve as anticipated.   I spent 20 minutes non-face-to-face telephone visit time dedicated to the care of this patient on the date of this encounter    Beckey Rutter, [place], AGNP-C [facility] [phone] (clinic)

## 2021-10-08 ENCOUNTER — Encounter: Payer: Self-pay | Admitting: Internal Medicine

## 2021-10-08 NOTE — Progress Notes (Signed)
Appt cancelled

## 2021-10-23 ENCOUNTER — Other Ambulatory Visit: Payer: Self-pay | Admitting: *Deleted

## 2021-10-23 DIAGNOSIS — D4620 Refractory anemia with excess of blasts, unspecified: Secondary | ICD-10-CM

## 2021-10-26 ENCOUNTER — Encounter: Payer: Self-pay | Admitting: Internal Medicine

## 2021-10-26 MED ORDER — LENALIDOMIDE 10 MG PO CAPS: 10.0000 mg | ORAL_CAPSULE | Freq: Every day | ORAL | 0 refills | Status: DC

## 2021-10-27 ENCOUNTER — Inpatient Hospital Stay (HOSPITAL_BASED_OUTPATIENT_CLINIC_OR_DEPARTMENT_OTHER): Payer: PPO | Admitting: Internal Medicine

## 2021-10-27 ENCOUNTER — Inpatient Hospital Stay: Payer: PPO

## 2021-10-27 ENCOUNTER — Encounter: Payer: Self-pay | Admitting: Internal Medicine

## 2021-10-27 ENCOUNTER — Telehealth: Payer: Self-pay | Admitting: *Deleted

## 2021-10-27 ENCOUNTER — Other Ambulatory Visit: Payer: Self-pay

## 2021-10-27 DIAGNOSIS — D4620 Refractory anemia with excess of blasts, unspecified: Secondary | ICD-10-CM | POA: Diagnosis not present

## 2021-10-27 DIAGNOSIS — D461 Refractory anemia with ring sideroblasts: Secondary | ICD-10-CM | POA: Diagnosis not present

## 2021-10-27 LAB — CBC WITH DIFFERENTIAL/PLATELET
Abs Immature Granulocytes: 0.03 10*3/uL (ref 0.00–0.07)
Basophils Absolute: 0 10*3/uL (ref 0.0–0.1)
Basophils Relative: 1 %
Eosinophils Absolute: 0.2 10*3/uL (ref 0.0–0.5)
Eosinophils Relative: 5 %
HCT: 25 % — ABNORMAL LOW (ref 36.0–46.0)
Hemoglobin: 8.3 g/dL — ABNORMAL LOW (ref 12.0–15.0)
Immature Granulocytes: 1 %
Lymphocytes Relative: 33 %
Lymphs Abs: 1.1 10*3/uL (ref 0.7–4.0)
MCH: 36.7 pg — ABNORMAL HIGH (ref 26.0–34.0)
MCHC: 33.2 g/dL (ref 30.0–36.0)
MCV: 110.6 fL — ABNORMAL HIGH (ref 80.0–100.0)
Monocytes Absolute: 0.5 10*3/uL (ref 0.1–1.0)
Monocytes Relative: 15 %
Neutro Abs: 1.5 10*3/uL — ABNORMAL LOW (ref 1.7–7.7)
Neutrophils Relative %: 45 %
Platelets: 193 10*3/uL (ref 150–400)
RBC: 2.26 MIL/uL — ABNORMAL LOW (ref 3.87–5.11)
RDW: 19.5 % — ABNORMAL HIGH (ref 11.5–15.5)
WBC: 3.3 10*3/uL — ABNORMAL LOW (ref 4.0–10.5)
nRBC: 0.6 % — ABNORMAL HIGH (ref 0.0–0.2)

## 2021-10-27 LAB — COMPREHENSIVE METABOLIC PANEL
ALT: 16 U/L (ref 0–44)
AST: 21 U/L (ref 15–41)
Albumin: 4 g/dL (ref 3.5–5.0)
Alkaline Phosphatase: 48 U/L (ref 38–126)
Anion gap: 11 (ref 5–15)
BUN: 14 mg/dL (ref 8–23)
CO2: 24 mmol/L (ref 22–32)
Calcium: 9 mg/dL (ref 8.9–10.3)
Chloride: 103 mmol/L (ref 98–111)
Creatinine, Ser: 0.94 mg/dL (ref 0.44–1.00)
GFR, Estimated: >60 mL/min (ref >60)
Glucose, Bld: 146 mg/dL — ABNORMAL HIGH (ref 70–99)
Potassium: 3.4 mmol/L — ABNORMAL LOW (ref 3.5–5.1)
Sodium: 138 mmol/L (ref 135–145)
Total Bilirubin: 0.8 mg/dL (ref 0.3–1.2)
Total Protein: 7.7 g/dL (ref 6.5–8.1)

## 2021-10-27 LAB — LACTATE DEHYDROGENASE: LDH: 91 U/L — ABNORMAL LOW (ref 98–192)

## 2021-10-27 MED ORDER — DARBEPOETIN ALFA 500 MCG/ML IJ SOSY
500.0000 ug | PREFILLED_SYRINGE | Freq: Once | INTRAMUSCULAR | Status: AC
  Administered 2021-10-27: 500 ug via SUBCUTANEOUS
  Filled 2021-10-27: qty 1

## 2021-10-27 MED ORDER — LENALIDOMIDE 10 MG PO CAPS: 10.0000 mg | ORAL_CAPSULE | Freq: Every day | ORAL | 0 refills | Status: DC

## 2021-10-27 NOTE — Telephone Encounter (Signed)
Called [employer] and they do not have the information of corrected [employer] auth in the system yet.  Provided correct auth number verbally.

## 2021-10-27 NOTE — Progress Notes (Signed)
[place] CONSULT NOTE  Patient Care Team: [name], [name], MD as PCP - General Rogue Bussing, [name], MD as Consulting Physician (Hematology and Oncology)  CHIEF COMPLAINTS/PURPOSE OF CONSULTATION: MDS   Oncology History Overview Note   # July 2020-myelodysplastic syndrome with ringed sideroblasts-Normal karyotype; no blasts [IPSS R-Very low risk ~median survival 8.3 years]; iron studies F74 folic acid myeloma panel normal; pyridoxine levels/copper/zinc-WNL. Erythropoietin levels-60. 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  Carla SiharathMay 71 y.o.  female history of low-grade MDS anemia with ring sideroblasts currently on Aranesp/Revlimid is here for follow-up.  patient complains of mild to moderate fatigue.  Denies any significant nausea vomiting abdominal pain.  Denies any fevers or chills.  No nausea no vomiting.  No chest pain or shortness with or cough.  Chronic intermittent mild diarrhea.  Patient complains of right shoulder pain for the last 2 to 3 weeks-not any worse.  Review of Systems  Constitutional:  Positive for malaise/fatigue. Negative for chills, diaphoresis and fever.  HENT:  Negative for nosebleeds and sore throat.    Eyes:  Negative for double vision.  Respiratory:  Negative for cough, hemoptysis, sputum production, shortness of breath and wheezing.   Cardiovascular:  Negative for chest pain and orthopnea.  Gastrointestinal:  Negative for abdominal pain, blood in stool, constipation, diarrhea, heartburn, melena and vomiting.  Genitourinary:  Negative for dysuria, frequency and urgency.  Musculoskeletal:  Positive for back pain and joint pain.  Skin: Negative.  Negative for itching and rash.  Neurological:  Negative for dizziness, tingling, focal weakness, weakness and headaches.  Endo/Heme/Allergies:  Does not bruise/bleed easily.  Psychiatric/Behavioral:  Negative for depression. The patient is not nervous/anxious and does not have insomnia.    MEDICAL HISTORY:  Past Medical History:  Diagnosis Date   Allergic rhinitis, cause unspecified    Anemia, unspecified    Carpal tunnel syndrome    Cystitis, unspecified    Family history of osteoporosis    PONV (postoperative nausea and vomiting)    Postnasal drip    Syncope and collapse     SURGICAL HISTORY: Past Surgical History:  Procedure Laterality Date   CARPAL TUNNEL RELEASE     COLONOSCOPY  2005   COLONOSCOPY WITH PROPOFOL N/A 07/31/2015   Procedure: COLONOSCOPY WITH PROPOFOL;  Surgeon: RRonald Lobo MD;  Location: [facility];  Service: Endoscopy;  Laterality: N/A;   DIAGNOSTIC LAPAROSCOPY     dx lack of pregnancy    EYE SURGERY     lasik, cataract, prk,yag procedure    SOCIAL HISTORY: Social History   Socioeconomic History   Marital status: Married    Spouse name: Not on file   Number of children: Not on file   Years of education: Not on file   Highest education level: Not on file  Occupational History   Not on file  Tobacco Use   Smoking status: Never   Smokeless tobacco: Never  Vaping Use   Vaping Use: Never used  Substance and Sexual Activity   Alcohol use: Yes    Alcohol/week: 0.0 standard drinks    Comment: Rare    Drug use: No   Sexual activity: Not Currently  Other Topics Concern   Not on file  Social History Narrative   No regular exercise      Drinks lots of Pepsi         Social Determinants of Health   Financial Resource Strain: Not on file  Food Insecurity: Not on file  Transportation Needs: Not on file  Physical Activity: Not on file  Stress: Not on file  Social Connections: Not on file  Intimate Partner Violence: Not on file    FAMILY HISTORY: Family History  Problem Relation Age of Onset   Osteoporosis Mother    Stroke Mother    Coronary artery disease Father    Stroke Father 33   Diabetes Other        Aunts and uncles   Breast cancer Paternal Aunt    Breast cancer Maternal Aunt    Arthritis/Rheumatoid Child    Breast cancer Cousin     ALLERGIES:  is allergic to codeine.  MEDICATIONS:  Current Outpatient Medications  Medication Sig Dispense Refill   acetaminophen (TYLENOL) 500 MG tablet Take 500-1,000 mg by mouth every 6 (six) hours as needed for mild pain or headache.     aspirin EC 81 MG tablet Take 81 mg by mouth at bedtime. Swallow whole.      BIOTIN PO Take 1 tablet by mouth 3 (three) times a week.     Darbepoetin Alfa (ARANESP) 500 MCG/ML SOSY injection Inject 500 mcg into the skin See admin instructions. Inject 500 mcg into the skin every week     ergocalciferol (VITAMIN D2) 1.25 MG (50000 UT) capsule Take 1 capsule (50,000 Units total) by mouth once a week. 12 capsule 1   lenalidomide (REVLIMID) 10 MG capsule Take 1 capsule (10 mg total) by mouth daily. 3 weeks-On and 1 week-OFF. 21 capsule 0   levothyroxine (SYNTHROID) 25 MCG tablet TAKE 1 TABLET BY MOUTH ONCE A DAY BEFOREBREAKFAST. 90 tablet 1   loperamide (IMODIUM) 2 MG capsule Take by mouth as needed for diarrhea or loose stools.     ondansetron (ZOFRAN ODT) 8 MG disintegrating tablet Take 1 tablet (8 mg total) by mouth every 8 (eight) hours as needed for nausea or vomiting. 30 tablet 0   No current  facility-administered medications for this visit.      PHYSICAL EXAMINATION:   Vitals:   10/27/21 0952  BP: (!) 129/51  Pulse: 68  Temp: (!) 97.5 F (36.4 C)  SpO2: 100%    Filed Weights   10/27/21 0952  Weight: 151 lb 6.4 oz (68.7 kg)     Physical Exam HENT:     Head: Normocephalic and atraumatic.     Mouth/Throat:     Pharynx: No oropharyngeal exudate.  Eyes:     Pupils: Pupils are equal, round, and reactive to light.  Cardiovascular:     Rate and Rhythm: Normal rate and regular rhythm.     Pulses: Normal pulses.     Heart sounds: Normal heart sounds.  Pulmonary:     Effort: Pulmonary effort is normal. No respiratory distress.     Breath sounds: Normal breath sounds. No wheezing.  Abdominal:     General: Bowel  sounds are normal. There is no distension.     Palpations: Abdomen is soft. There is no mass.     Tenderness: There is no abdominal tenderness. There is no guarding or rebound.  Musculoskeletal:        General: No tenderness. Normal range of motion.     Cervical back: Normal range of motion and neck supple.  Skin:    General: Skin is warm.  Neurological:     Mental Status: She is alert and oriented to person, place, and time.  Psychiatric:        Mood and Affect: Affect normal.    LABORATORY DATA:  I have reviewed the data as listed Lab Results  Component Value Date   WBC 3.3 (L) 10/27/2021   HGB 8.3 (L) 10/27/2021   HCT 25.0 (L) 10/27/2021   MCV 110.6 (H) 10/27/2021   PLT 193 10/27/2021   Recent Labs    08/04/21 1408 09/29/21 0944 10/27/21 0941  NA 137 136 138  K 3.5 3.9 3.4*  CL 103 102 103  CO2 _0 GLUCOSE 125* 154* 146*  BUN _1 CREATININE 0.87 1.03* 0.94  CALCIUM 8.7* 8.9 9.0  GFRNONAA >60 58* >60  PROT 7.7 8.2* 7.7  ALBUMIN 4.1 4.1 4.0  AST _2 ALT _3 ALKPHOS 56 61 48  BILITOT 0.8 0.5 0.8     No results found.  MDS (myelodysplastic syndrome), low grade (HCC) # Low grade MDS- with ringed  sideroblasts; no blasts. POSITIVE  For SF3B1; R-IPSS-low risk. . Currently on Aranesp 500 mcg weekly subcu injections; and  lenalidomide 10 mg  3w-ON & 1 w-OFF-tolerating fairly well except for mild GI effects/mild neutropenia. ?  Question getting worse.   # Hemoglobin is 8.3; proceed with Aranesp.  Monitor closely. Continue Revlimid 36m; days-1-21; 7 days OFF. Discussed slow trend down of hemoglobin over last 3-4 months or so. Discussed re: bone marrow Biopsy to re-evaluate. Pt interested , but wants to wait for 1 more month.  Check iron studies/ferritin.  Discussed regarding Luspatercept every 3 weeks.  # Diarrhea/dyspepsis-G-1-2; sec to revlimid- monitor for now; discussed regarding use of cholestyramine- /decrease fat intake.  STABLE; controlled with immodium prn.   # Right shoulder pain/radiating to Right UE- [several weeks]; wants to hold off X-ray- continue ice/ heat for -STABLE; . Continue prn NSAIDs.   # DISPOSITION:  # ADD Iron studies/ferritin/LDH # proceed with Aranesp. # in 4 weeks- MD; labs- cbc/Cmp;LDH;hold tube; possible; Aranesp SQ- Dr.B  All questions were answered. The patient knows to call the clinic with any problems, questions or concerns.   GCammie Sickle MD 10/27/2021 10:54 AM

## 2021-10-27 NOTE — Telephone Encounter (Signed)
Call from [facility] stating that the auth # on this patient prescription is for another patient. They request new auth # be obtained and resend prescription

## 2021-10-27 NOTE — Progress Notes (Signed)
New rx submitted with corrected [employer] Auth #

## 2021-10-27 NOTE — Assessment & Plan Note (Addendum)
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 . Currently on Aranesp 500 mcg weekly subcu injections; and  lenalidomide 10 mg  3w-ON & 1 w-OFF-tolerating fairly well except for mild GI effects/mild neutropenia. ?  Question getting worse.   # Hemoglobin is 8.3; proceed with Aranesp.  Monitor closely. Continue Revlimid 68m; days-1-21; 7 days OFF. Discussed slow trend down of hemoglobin over last 3-4 months or so. Discussed re: bone marrow Biopsy to re-evaluate. Pt interested , but wants to wait for 1 more month.  Check iron studies/ferritin.  Discussed regarding Luspatercept every 3 weeks.  # Diarrhea/dyspepsis-G-1-2; sec to revlimid- monitor for now; discussed regarding use of cholestyramine- /decrease fat intake.  STABLE; controlled with immodium prn.   # Right shoulder pain/radiating to Right UE- [several weeks]; wants to hold off X-ray- continue ice/ heat for -STABLE; . Continue prn NSAIDs.   # DISPOSITION:  # ADD Iron studies/ferritin/LDH # proceed with Aranesp. # in 4 weeks- MD; labs- cbc/Cmp;LDH;hold tube; possible; Aranesp SQ- Dr.B

## 2021-10-27 NOTE — Telephone Encounter (Signed)
Rx was resubmitted today at 11:44 with corrected [place] number.  Was the call received regarding rx sent today or yesterday?

## 2021-10-30 ENCOUNTER — Other Ambulatory Visit: Payer: Self-pay | Admitting: Pharmacist

## 2021-10-30 DIAGNOSIS — D4620 Refractory anemia with excess of blasts, unspecified: Secondary | ICD-10-CM

## 2021-10-30 MED ORDER — LENALIDOMIDE 10 MG PO CAPS: 10.0000 mg | ORAL_CAPSULE | Freq: Every day | ORAL | 0 refills | Status: DC

## 2021-10-30 NOTE — Progress Notes (Signed)
Received the below notification from [facility]:  "Patient reports she was sick during her last cycle and has 7 capsules on hand and will start her next cycle on 2/9. Pt requests to have only 14 capsules sent to complete her 21 day cycle."  Rx for 14 capsules sent to [facility].

## 2021-11-24 ENCOUNTER — Telehealth: Payer: Self-pay | Admitting: *Deleted

## 2021-11-24 ENCOUNTER — Inpatient Hospital Stay (HOSPITAL_BASED_OUTPATIENT_CLINIC_OR_DEPARTMENT_OTHER): Payer: PPO | Admitting: Internal Medicine

## 2021-11-24 ENCOUNTER — Inpatient Hospital Stay: Payer: PPO

## 2021-11-24 ENCOUNTER — Telehealth: Payer: Self-pay

## 2021-11-24 ENCOUNTER — Other Ambulatory Visit: Payer: Self-pay

## 2021-11-24 ENCOUNTER — Encounter: Payer: Self-pay | Admitting: Internal Medicine

## 2021-11-24 ENCOUNTER — Inpatient Hospital Stay: Payer: PPO | Attending: Internal Medicine

## 2021-11-24 DIAGNOSIS — D4620 Refractory anemia with excess of blasts, unspecified: Secondary | ICD-10-CM

## 2021-11-24 DIAGNOSIS — Z803 Family history of malignant neoplasm of breast: Secondary | ICD-10-CM | POA: Diagnosis not present

## 2021-11-24 DIAGNOSIS — M25511 Pain in right shoulder: Secondary | ICD-10-CM | POA: Diagnosis not present

## 2021-11-24 DIAGNOSIS — R197 Diarrhea, unspecified: Secondary | ICD-10-CM | POA: Insufficient documentation

## 2021-11-24 DIAGNOSIS — R002 Palpitations: Secondary | ICD-10-CM | POA: Insufficient documentation

## 2021-11-24 LAB — CBC WITH DIFFERENTIAL/PLATELET
Abs Immature Granulocytes: 0.02 10*3/uL (ref 0.00–0.07)
Basophils Absolute: 0.1 10*3/uL (ref 0.0–0.1)
Basophils Relative: 2 %
Eosinophils Absolute: 0.4 10*3/uL (ref 0.0–0.5)
Eosinophils Relative: 11 %
HCT: 27 % — ABNORMAL LOW (ref 36.0–46.0)
Hemoglobin: 8.9 g/dL — ABNORMAL LOW (ref 12.0–15.0)
Immature Granulocytes: 1 %
Lymphocytes Relative: 31 %
Lymphs Abs: 1.2 10*3/uL (ref 0.7–4.0)
MCH: 36 pg — ABNORMAL HIGH (ref 26.0–34.0)
MCHC: 33 g/dL (ref 30.0–36.0)
MCV: 109.3 fL — ABNORMAL HIGH (ref 80.0–100.0)
Monocytes Absolute: 0.5 10*3/uL (ref 0.1–1.0)
Monocytes Relative: 14 %
Neutro Abs: 1.6 10*3/uL — ABNORMAL LOW (ref 1.7–7.7)
Neutrophils Relative %: 41 %
Platelets: 184 10*3/uL (ref 150–400)
RBC: 2.47 MIL/uL — ABNORMAL LOW (ref 3.87–5.11)
RDW: 19.7 % — ABNORMAL HIGH (ref 11.5–15.5)
WBC: 3.8 10*3/uL — ABNORMAL LOW (ref 4.0–10.5)
nRBC: 0 % (ref 0.0–0.2)

## 2021-11-24 LAB — COMPREHENSIVE METABOLIC PANEL
ALT: 17 U/L (ref 0–44)
AST: 19 U/L (ref 15–41)
Albumin: 4.2 g/dL (ref 3.5–5.0)
Alkaline Phosphatase: 43 U/L (ref 38–126)
Anion gap: 9 (ref 5–15)
BUN: 15 mg/dL (ref 8–23)
CO2: 25 mmol/L (ref 22–32)
Calcium: 8.9 mg/dL (ref 8.9–10.3)
Chloride: 103 mmol/L (ref 98–111)
Creatinine, Ser: 0.95 mg/dL (ref 0.44–1.00)
GFR, Estimated: >60 mL/min (ref >60)
Glucose, Bld: 119 mg/dL — ABNORMAL HIGH (ref 70–99)
Potassium: 3.8 mmol/L (ref 3.5–5.1)
Sodium: 137 mmol/L (ref 135–145)
Total Bilirubin: 1.2 mg/dL (ref 0.3–1.2)
Total Protein: 8.2 g/dL — ABNORMAL HIGH (ref 6.5–8.1)

## 2021-11-24 LAB — SAMPLE TO BLOOD BANK

## 2021-11-24 LAB — LACTATE DEHYDROGENASE: LDH: 89 U/L — ABNORMAL LOW (ref 98–192)

## 2021-11-24 MED ORDER — DARBEPOETIN ALFA 500 MCG/ML IJ SOSY
500.0000 ug | PREFILLED_SYRINGE | Freq: Once | INTRAMUSCULAR | Status: AC
  Administered 2021-11-24: 500 ug via SUBCUTANEOUS
  Filled 2021-11-24: qty 1

## 2021-11-24 NOTE — Progress Notes (Signed)
[place] CONSULT NOTE  Patient Care Team: [name], [name], MD as PCP - General Rogue Bussing, [name], MD as Consulting Physician (Hematology and Oncology)  CHIEF COMPLAINTS/PURPOSE OF CONSULTATION: MDS   Oncology History Overview Note   # July 2020-myelodysplastic syndrome with ringed sideroblasts-Normal karyotype; no blasts [IPSS R-Very low risk ~median survival 8.3 years]; iron studies P23 folic acid myeloma panel normal; pyridoxine levels/copper/zinc-WNL. Erythropoietin levels-60. 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  Ambulating independently.  Carla BeaumontMay 71 y.o.  female history of low-grade MDS anemia with ring sideroblasts currently on Aranesp/Revlimid is here for follow-up.  Patient feels fatigued.  Notes to have palpitations especially at resting.  Shortness of breath on exertion.  Otherwise denies no fever no chills.  No cough.  No nausea no vomiting. Chronic intermittent mild diarrhea.  Review of Systems  Constitutional:  Positive for malaise/fatigue. Negative for chills, diaphoresis and fever.  HENT:  Negative for nosebleeds and sore throat.   Eyes:  Negative for double vision.  Respiratory:  Negative for cough, hemoptysis,  sputum production, shortness of breath and wheezing.   Cardiovascular:  Negative for chest pain and orthopnea.  Gastrointestinal:  Negative for abdominal pain, blood in stool, constipation, diarrhea, heartburn, melena and vomiting.  Genitourinary:  Negative for dysuria, frequency and urgency.  Musculoskeletal:  Positive for back pain and joint pain.  Skin: Negative.  Negative for itching and rash.  Neurological:  Negative for dizziness, tingling, focal weakness, weakness and headaches.  Endo/Heme/Allergies:  Does not bruise/bleed easily.  Psychiatric/Behavioral:  Negative for depression. The patient is not nervous/anxious and does not have insomnia.    MEDICAL HISTORY:  Past Medical History:  Diagnosis Date   Allergic rhinitis, cause unspecified    Anemia, unspecified    Carpal tunnel syndrome    Cystitis, unspecified    Family history of osteoporosis    PONV (postoperative nausea and vomiting)    Postnasal drip    Syncope and collapse     SURGICAL HISTORY: Past Surgical History:  Procedure Laterality Date   CARPAL TUNNEL RELEASE     COLONOSCOPY  2005   COLONOSCOPY WITH PROPOFOL N/A 07/31/2015   Procedure: COLONOSCOPY WITH PROPOFOL;  Surgeon: RRonald Lobo MD;  Location: [facility];  Service: Endoscopy;  Laterality: N/A;   DIAGNOSTIC LAPAROSCOPY     dx lack of pregnancy    EYE SURGERY     lasik, cataract, prk,yag procedure    SOCIAL HISTORY: Social History   Socioeconomic History   Marital status: Married    Spouse name: Not on file   Number of children: Not on file   Years of education: Not on file   Highest education level: Not on file  Occupational History   Not on file  Tobacco Use   Smoking status: Never   Smokeless tobacco: Never  Vaping Use  Vaping Use: Never used  Substance and Sexual Activity   Alcohol use: Yes    Alcohol/week: 0.0 standard drinks    Comment: Rare   Drug use: No   Sexual activity: Not Currently  Other Topics Concern   Not on  file  Social History Narrative   No regular exercise      Drinks lots of Pepsi         Social Determinants of Health   Financial Resource Strain: Not on file  Food Insecurity: Not on file  Transportation Needs: Not on file  Physical Activity: Not on file  Stress: Not on file  Social Connections: Not on file  Intimate Partner Violence: Not on file    FAMILY HISTORY: Family History  Problem Relation Age of Onset   Osteoporosis Mother    Stroke Mother    Coronary artery disease Father    Stroke Father 27   Diabetes Other        Aunts and uncles   Breast cancer Paternal Aunt    Breast cancer Maternal Aunt    Arthritis/Rheumatoid Child    Breast cancer Cousin     ALLERGIES:  is allergic to codeine.  MEDICATIONS:  Current Outpatient Medications  Medication Sig Dispense Refill   acetaminophen (TYLENOL) 500 MG tablet Take 500-1,000 mg by mouth every 6 (six) hours as needed for mild pain or headache.     aspirin EC 81 MG tablet Take 81 mg by mouth at bedtime. Swallow whole.      Darbepoetin Alfa (ARANESP) 500 MCG/ML SOSY injection Inject 500 mcg into the skin See admin instructions. Inject 500 mcg into the skin every week     lenalidomide (REVLIMID) 10 MG capsule Take 1 capsule (10 mg total) by mouth daily. Take for 21 days, then hold for 7 days. Repeat every 28 days. 14 capsule 0   levothyroxine (SYNTHROID) 25 MCG tablet TAKE 1 TABLET BY MOUTH ONCE A DAY BEFOREBREAKFAST. 90 tablet 1   loperamide (IMODIUM) 2 MG capsule Take by mouth as needed for diarrhea or loose stools.     ondansetron (ZOFRAN ODT) 8 MG disintegrating tablet Take 1 tablet (8 mg total) by mouth every 8 (eight) hours as needed for nausea or vomiting. 30 tablet 0   BIOTIN PO Take 1 tablet by mouth 3 (three) times a week. (Patient not taking: Reported on 11/24/2021)     ergocalciferol (VITAMIN D2) 1.25 MG (50000 UT) capsule Take 1 capsule (50,000 Units total) by mouth once a week. (Patient not taking: Reported on  11/24/2021) 12 capsule 1   No current facility-administered medications for this visit.      PHYSICAL EXAMINATION:   Vitals:   11/24/21 0935  BP: (!) 115/42  Pulse: 62  Resp: 19  Temp: 98.7 F (37.1 C)  SpO2: 99%    Filed Weights   11/24/21 0935  Weight: 148 lb (67.1 kg)     Physical Exam HENT:     Head: Normocephalic and atraumatic.     Mouth/Throat:     Pharynx: No oropharyngeal exudate.  Eyes:     Pupils: Pupils are equal, round, and reactive to light.  Cardiovascular:     Rate and Rhythm: Normal rate and regular rhythm.     Pulses: Normal pulses.     Heart sounds: Normal heart sounds.  Pulmonary:     Effort: Pulmonary effort is normal. No respiratory distress.     Breath sounds: Normal breath sounds. No wheezing.  Abdominal:  General: Bowel sounds are normal. There is no distension.     Palpations: Abdomen is soft. There is no mass.     Tenderness: There is no abdominal tenderness. There is no guarding or rebound.  Musculoskeletal:        General: No tenderness. Normal range of motion.     Cervical back: Normal range of motion and neck supple.  Skin:    General: Skin is warm.  Neurological:     Mental Status: She is alert and oriented to person, place, and time.  Psychiatric:        Mood and Affect: Affect normal.    LABORATORY DATA:  I have reviewed the data as listed Lab Results  Component Value Date   WBC 3.8 (L) 11/24/2021   HGB 8.9 (L) 11/24/2021   HCT 27.0 (L) 11/24/2021   MCV 109.3 (H) 11/24/2021   PLT 184 11/24/2021   Recent Labs    09/29/21 0944 10/27/21 0941 11/24/21 0903  NA 136 138 137  K 3.9 3.4* 3.8  CL 102 103 103  CO2 25 24 25   GLUCOSE 154* 146* 119*  BUN 18 14 15   CREATININE 1.03* 0.94 0.95  CALCIUM 8.9 9.0 8.9  GFRNONAA 58* >60 >60  PROT 8.2* 7.7 8.2*  ALBUMIN 4.1 4.0 4.2  AST 21 21 19   ALT 17 16 17   ALKPHOS 61 48 43  BILITOT 0.5 0.8 1.2     No results found.  MDS (myelodysplastic syndrome), low grade  (HCC) # Low grade MDS- with ringed sideroblasts; no blasts. POSITIVE  For SF3B1; R-IPSS-low risk. . Currently on Aranesp 500 mcg weekly subcu injections; and  lenalidomide 10 mg  3w-ON & 1 w-OFF-tolerating fairly well except for mild GI effects/mild neutropenia.  Overall trend seems to be worsening anemia/see below.    # Hemoglobin is 8.9; proceed with Aranesp.  Monitor closely. Continue Revlimid 10 mg; days-1-21; 7 days OFF. Again reviwed with pt and husband discussed slow trend down of hemoglobin over last 4-5 months or so. Discussed re: bone marrow Biopsy to re-evaluate. Pt finally agrees. Also discussed re: revaluation at [facility]; pt reluctant. Discussed regarding Luspatercept every 3 weeks.  # Diarrhea/dyspepsis-G-1-2; sec to revlimid- monitor for now; discussed regarding use of cholestyramine- /decrease fat intake.  controlled with immodium prn. STABLE;   # Palpittaions: Normal rate and rhythm- recommend evaluation with PCP/cards.   # Right shoulder pain/radiating to Right UE- [several weeks]; wants to hold off X-ray- continue ice/ heat for -STABLE; . Continue prn NSAIDs.   # DISPOSITION:  # proceed with Aranesp. # bone marrow in next 1-2 weeks # in 4 weeks- MD; labs- cbc/Cmp;LDH;hold tube; possible; Aranesp SQ- Dr.B  All questions were answered. The patient knows to call the clinic with any problems, questions or concerns.   Cammie Sickle, MD 11/24/2021 12:14 PM

## 2021-11-24 NOTE — Telephone Encounter (Signed)
Faxed order for bone marrow biopsy to [facility].

## 2021-11-24 NOTE — Assessment & Plan Note (Addendum)
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 . Currently on Aranesp 500 mcg weekly subcu injections; and  lenalidomide 10 mg  3w-ON & 1 w-OFF-tolerating fairly well except for mild GI effects/mild neutropenia.  Overall trend seems to be worsening anemia/see below.    # Hemoglobin is 8.9; proceed with Aranesp.  Monitor closely. Continue Revlimid 10 mg; days-1-21; 7 days OFF. Again reviwed with pt and husband discussed slow trend down of hemoglobin over last 4-5 months or so. Discussed re: bone marrow Biopsy to re-evaluate. Pt finally agrees. Also discussed re: revaluation at [facility]; pt reluctant. Discussed regarding Luspatercept every 3 weeks.  # Diarrhea/dyspepsis-G-1-2; sec to revlimid- monitor for now; discussed regarding use of cholestyramine- /decrease fat intake.  controlled with immodium prn. STABLE;   # Palpittaions: Normal rate and rhythm- recommend evaluation with PCP/cards.   # Right shoulder pain/radiating to Right UE- [several weeks]; wants to hold off X-ray- continue ice/ heat for -STABLE; . Continue prn NSAIDs.   # DISPOSITION:  # proceed with Aranesp. # bone marrow in next 1-2 weeks # in 4 weeks- MD; labs- cbc/Cmp;LDH;hold tube; possible; Aranesp SQ- Dr.B

## 2021-11-24 NOTE — Progress Notes (Signed)
Patient states she is feeling a little weak. Patient husband states her weakness is getting progressively worse.  Patient husband states she more naps than usual..  Patient states she had a cluster of whelp form on her right arm on yesterday but it went away.  Patient states she has had some shortness of breathe and some palpitation. Patient states she can feel her Hbg is low.

## 2021-11-24 NOTE — Telephone Encounter (Addendum)
Patient called and states she has questions about things to be done listed on her AVS stating that no one mentioned to her about getting done and would like a return call to discuss. Labs, BMD, BMBX (which is the only thing she was aware of) Please return her call

## 2021-11-25 ENCOUNTER — Other Ambulatory Visit (HOSPITAL_COMMUNITY): Payer: Self-pay

## 2021-11-25 ENCOUNTER — Encounter: Payer: Self-pay | Admitting: Internal Medicine

## 2021-11-25 NOTE — Telephone Encounter (Signed)
Dr. Jacinto Reap, an order was placed yesterday for a BMD but no instructions in check out to schedule or no mention in office note.  Does patient need BMD scheduled? ?

## 2021-11-25 NOTE — Telephone Encounter (Signed)
Appt sch'd for 11/27/2021 at 8:30 am. Pt notified by Nancee Liter. ?

## 2021-11-26 ENCOUNTER — Encounter: Payer: Self-pay | Admitting: Internal Medicine

## 2021-11-26 ENCOUNTER — Telehealth: Payer: Self-pay | Admitting: Pharmacy Technician

## 2021-11-26 ENCOUNTER — Other Ambulatory Visit: Payer: Self-pay | Admitting: Physician Assistant

## 2021-11-26 NOTE — Telephone Encounter (Signed)
Oral Oncology Patient Advocate Encounter ?  ?Received notification from [employer] [facility] [employer] that prior authorization for Revlimid is required. ?  ?PA submitted on CoverMyMeds ?Key [id number]  ?Status is pending ?  ?Oral Oncology Clinic will continue to follow. ? ?Dennison Nancy CPHT ?Specialty Pharmacy Patient Advocate ?[place] ?Phone [phone] ?Fax [phone] ?11/26/2021 9:55 AM ? ?

## 2021-11-26 NOTE — Telephone Encounter (Signed)
Oral Oncology Patient Advocate Encounter ? ?Prior Authorization for Revlimid (lenalidomide) has been approved.   ? ?PA# [id number] ?Effective dates: 11/26/21 through 11/26/22 ? ? ? ?Oral Oncology Clinic will continue to follow.  ? ?Dennison Nancy CPHT ?Specialty Pharmacy Patient Advocate ?[place] ?Phone [phone] ?Fax [phone] ?11/26/2021 9:56 AM ? ?

## 2021-11-26 NOTE — Telephone Encounter (Signed)
Patient informed no BMD needed.  Will cancel order for BMD in chart. ?

## 2021-11-26 NOTE — H&P (Signed)
? ?Chief Complaint: ?Patient was seen in consultation today for bone marrow biopsy with aspiration ? ?Referring Physician(s): ?Brahmanday,Govinda R ? ?Supervising Physician: Juliet Rude ? ?Patient Status: Carla [name] ? ?History of Present Illness: ?Carla [name] is a 71 y.o. 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 She is currently on Aranesp/Revlimid and she is followed by hematology/oncology. Her hemoglobin levels have been slowly down trending and her oncologist would like a bone marrow biopsy for further work up. She is familiar to IR from a bone marrow biopsy with aspiration in 2020.  ? ?Interventional Radiology has been asked to evaluate this patient for an image-guided bone marrow biopsy with aspiration.  ? ?Past Medical History:  ?Diagnosis Date  ? Allergic rhinitis, cause unspecified   ? Anemia, unspecified   ? Carpal tunnel syndrome   ? Cystitis, unspecified   ? Family history of osteoporosis   ? PONV (postoperative nausea and vomiting)   ? Postnasal drip   ? Syncope and collapse   ? ? ?Past Surgical History:  ?Procedure Laterality Date  ? BONE MARROW BIOPSY    ? CARPAL TUNNEL RELEASE    ? COLONOSCOPY  09/28/2003  ? COLONOSCOPY WITH PROPOFOL N/A 07/31/2015  ? Procedure: COLONOSCOPY WITH PROPOFOL;  Surgeon: Ronald Lobo, MD;  Location: [facility];  Service: Endoscopy;  Laterality: N/A;  ? DIAGNOSTIC LAPAROSCOPY    ? dx lack of pregnancy   ? EYE SURGERY    ? lasik, cataract, prk,yag procedure  ? ? ?Allergies: ?Codeine ? ?Medications: ?Prior to Admission medications   ?Medication Sig Start Date End Date Taking? Authorizing Provider  ?acetaminophen (TYLENOL) 500 MG tablet Take 500-1,000 mg by mouth every 6 (six) hours as needed for mild pain or headache.    [provider]  ?aspirin EC 81 MG tablet Take 81 mg by mouth at bedtime. Swallow whole.     [provider]  ?BIOTIN PO Take 1 tablet by mouth 3 (three) times a  week. ?Patient not taking: Reported on 11/24/2021    [provider]  ?Darbepoetin Alfa (ARANESP) 500 MCG/ML SOSY injection Inject 500 mcg into the skin See admin instructions. Inject 500 mcg into the skin every week    [provider]  ?ergocalciferol (VITAMIN D2) 1.25 MG (50000 UT) capsule Take 1 capsule (50,000 Units total) by mouth once a week. ?Patient not taking: Reported on 11/24/2021 03/18/21   Cammie Sickle, MD  ?lenalidomide (REVLIMID) 10 MG capsule Take 1 capsule (10 mg total) by mouth daily. Take for 21 days, then hold for 7 days. Repeat every 28 days. 10/30/21   Cammie Sickle, MD  ?levothyroxine (SYNTHROID) 25 MCG tablet TAKE 1 TABLET BY MOUTH ONCE A DAY BEFOREBREAKFAST. 09/29/21   [name], [name], MD  ?loperamide (IMODIUM) 2 MG capsule Take by mouth as needed for diarrhea or loose stools.    [provider]  ?ondansetron (ZOFRAN ODT) 8 MG disintegrating tablet Take 1 tablet (8 mg total) by mouth every 8 (eight) hours as needed for nausea or vomiting. 05/20/20   Verlon Au, NP  ?  ? ?Family History  ?Problem Relation Age of Onset  ? Osteoporosis Mother   ? Stroke Mother   ? Coronary artery disease Father   ? Stroke Father 30  ? Diabetes Other   ?     Aunts and uncles  ? Breast cancer Paternal Aunt   ? Breast cancer Maternal Aunt   ?  Arthritis/Rheumatoid Child   ? Breast cancer Cousin   ? ? ?Social History  ? ?Socioeconomic History  ? Marital status: Married  ?  Spouse name: Not on file  ? Number of children: Not on file  ? Years of education: Not on file  ? Highest education level: Not on file  ?Occupational History  ? Not on file  ?Tobacco Use  ? Smoking status: Never  ? Smokeless tobacco: Never  ?Vaping Use  ? Vaping Use: Never used  ?Substance and Sexual Activity  ? Alcohol use: Yes  ?  Alcohol/week: 0.0 standard drinks  ?  Comment: Rare  ? Drug use: No  ? Sexual activity: Not Currently  ?Other Topics Concern  ? Not on file  ?Social History Narrative  ? No  regular exercise  ?   ? Drinks lots of [employer]eps[employer]  ?   ?   ? ?Social Determinants of Health  ? ?Financial Resource Strain: Not on file  ?Food Insecurity: Not on file  ?Transportation Needs: Not on file  ?Physical Activity: Not on file  ?Stress: Not on file  ?Social Connections: Not on file  ? ? ?Review of Systems: A 12 point ROS discussed and pertinent positives are indicated in the HPI above.  All other systems are negative. ? ?Review of Systems  ?Constitutional:  Positive for fatigue. Negative for appetite change.  ?Respiratory:  Negative for cough and shortness of breath.   ?Cardiovascular:  Positive for palpitations. Negative for chest pain and leg swelling.  ?Gastrointestinal:  Positive for diarrhea. Negative for abdominal pain, nausea and vomiting.  ?Neurological:  Negative for dizziness and headaches.  ? ?Vital Signs: ?Pulse 70   SpO2 97%  ? ?Physical Exam ?Constitutional:   ?   General: She is not in acute distress. ?   Appearance: She is not ill-appearing.  ?HENT:  ?   Mouth/Throat:  ?   Mouth: Mucous membranes are moist.  ?   Pharynx: Oropharynx is clear.  ?Cardiovascular:  ?   Rate and Rhythm: Normal rate. Rhythm irregular.  ?   Pulses: Normal pulses.  ?   Heart sounds: Normal heart sounds.  ?Pulmonary:  ?   Effort: Pulmonary effort is normal.  ?   Breath sounds: Normal breath sounds.  ?Abdominal:  ?   General: Bowel sounds are normal.  ?   Palpations: Abdomen is soft.  ?   Tenderness: There is no abdominal tenderness.  ?Musculoskeletal:  ?   Right lower leg: No edema.  ?   Left lower leg: No edema.  ?Skin: ?   General: Skin is warm and dry.  ?Neurological:  ?   Mental Status: She is alert and oriented to person, place, and time.  ? ? ?Imaging: ?No results found. ? ?Labs: ? ?CBC: ?Recent Labs  ?  08/04/21 ?1408 09/29/21 ?8119 10/27/21 ?1478 11/24/21 ?2956  ?WBC 4.4 4.9 3.3* 3.8*  ?HGB 9.5* 9.1* 8.3* 8.9*  ?HCT 28.0* 27.7* 25.0* 27.0*  ?PLT 226 313 193 184  ? ? ?COAGS: ?Recent Labs  ?  12/03/20 ?1119  ?INR 1.1   ?APTT 30  ? ? ?BMP: ?Recent Labs  ?  08/04/21 ?1408 09/29/21 ?2130 10/27/21 ?8657 11/24/21 ?8469  ?NA 137 136 138 137  ?K 3.5 3.9 3.4* 3.8  ?CL 103 102 103 103  ?CO2 28 25 24 25   ?GLUCOSE 125* 154* 146* 119*  ?BUN 15 18 14 15   ?CALCIUM 8.7* 8.9 9.0 8.9  ?CREATININE 0.87 1.03* 0.94 0.95  ?GFRNONAA >60  58* >60 >60  ? ? ?LIVER FUNCTION TESTS: ?Recent Labs  ?  08/04/21 ?1408 09/29/21 ?7793 10/27/21 ?9688 11/24/21 ?6484  ?BILITOT 0.8 0.5 0.8 1.2  ?AST 18 21 21 19   ?ALT 16 17 16 17   ?ALKPHOS 56 61 48 43  ?PROT 7.7 8.2* 7.7 8.2*  ?ALBUMIN 4.1 4.1 4.0 4.2  ? ? ?TUMOR MARKERS: ?No results for input(s): AFPTM, CEA, CA199, CHROMGRNA in the last 8760 hours. ? ?Assessment and Plan: ? ?Myelodysplastic syndrome; worsening anemia: Carla [name], 71 year old female, presents today to [facility] Interventional Radiology department for an image-guided bone marrow biopsy with aspiration. ? ?Risks and benefits of this procedure were discussed with the patient and/or patient's family including, but not limited to bleeding, infection, damage to adjacent structures or low yield requiring additional tests. ? ?All of the questions were answered and there is agreement to proceed. She has been NPO.  ? ?Consent signed and in chart. ? ?Thank you for this interesting consult.  I greatly enjoyed meeting Carla [name] and look forward to participating in their care.  A copy of this report was sent to the requesting provider on this date. ? ?Electronically Signed: ?Soyla Dryer, AGACNP-BC ?[phone] ?11/27/2021, 8:18 AM ? ? ?I spent a total of  30 Minutes   in face to face in clinical consultation, greater than 50% of which was counseling/coordinating care for bone marrow biopsy with aspiration.  ?

## 2021-11-26 NOTE — Telephone Encounter (Signed)
Done

## 2021-11-26 NOTE — Telephone Encounter (Addendum)
BMD order entered by mistake.  Please cancel appt for bone density so I can cancel the order. ?

## 2021-11-26 NOTE — Progress Notes (Signed)
Spoke with patient 11/26/21 @ 10:50 am. Instructions given to arrive at 7:30 for 8:30 procedure, NPO after midnight, need for driver post-procedure. Patient verbalized understanding.  ?

## 2021-11-27 ENCOUNTER — Other Ambulatory Visit: Payer: Self-pay

## 2021-11-27 ENCOUNTER — Ambulatory Visit
Admission: RE | Admit: 2021-11-27 | Discharge: 2021-11-27 | Disposition: A | Discharge status: Home / Self Care | Payer: PPO | Source: Ambulatory Visit | Attending: Internal Medicine | Admitting: Internal Medicine

## 2021-11-27 ENCOUNTER — Other Ambulatory Visit: Payer: Self-pay | Admitting: *Deleted

## 2021-11-27 DIAGNOSIS — D469 Myelodysplastic syndrome, unspecified: Secondary | ICD-10-CM | POA: Insufficient documentation

## 2021-11-27 DIAGNOSIS — D4620 Refractory anemia with excess of blasts, unspecified: Secondary | ICD-10-CM

## 2021-11-27 DIAGNOSIS — Z7961 Long term (current) use of immunomodulator: Secondary | ICD-10-CM | POA: Insufficient documentation

## 2021-11-27 DIAGNOSIS — D72822 Plasmacytosis: Secondary | ICD-10-CM | POA: Diagnosis not present

## 2021-11-27 DIAGNOSIS — D61818 Other pancytopenia: Secondary | ICD-10-CM | POA: Diagnosis not present

## 2021-11-27 LAB — CBC WITH DIFFERENTIAL/PLATELET
Abs Immature Granulocytes: 0.02 10*3/uL (ref 0.00–0.07)
Basophils Absolute: 0.1 10*3/uL (ref 0.0–0.1)
Basophils Relative: 2 %
Eosinophils Absolute: 0.4 10*3/uL (ref 0.0–0.5)
Eosinophils Relative: 11 %
HCT: 24 % — ABNORMAL LOW (ref 36.0–46.0)
Hemoglobin: 8 g/dL — ABNORMAL LOW (ref 12.0–15.0)
Immature Granulocytes: 1 %
Lymphocytes Relative: 32 %
Lymphs Abs: 1.1 10*3/uL (ref 0.7–4.0)
MCH: 35.1 pg — ABNORMAL HIGH (ref 26.0–34.0)
MCHC: 33.3 g/dL (ref 30.0–36.0)
MCV: 105.3 fL — ABNORMAL HIGH (ref 80.0–100.0)
Monocytes Absolute: 0.6 10*3/uL (ref 0.1–1.0)
Monocytes Relative: 17 %
Neutro Abs: 1.2 10*3/uL — ABNORMAL LOW (ref 1.7–7.7)
Neutrophils Relative %: 37 %
Platelets: 131 10*3/uL — ABNORMAL LOW (ref 150–400)
RBC: 2.28 MIL/uL — ABNORMAL LOW (ref 3.87–5.11)
RDW: 19.4 % — ABNORMAL HIGH (ref 11.5–15.5)
WBC: 3.3 10*3/uL — ABNORMAL LOW (ref 4.0–10.5)
nRBC: 0.9 % — ABNORMAL HIGH (ref 0.0–0.2)

## 2021-11-27 MED ORDER — FENTANYL CITRATE (PF) 100 MCG/2ML IJ SOLN
INTRAMUSCULAR | Status: AC
  Filled 2021-11-27: qty 2

## 2021-11-27 MED ORDER — HEPARIN SOD (PORK) LOCK FLUSH 100 UNIT/ML IV SOLN
INTRAVENOUS | Status: AC
  Filled 2021-11-27: qty 5

## 2021-11-27 MED ORDER — MIDAZOLAM HCL 2 MG/2ML IJ SOLN
INTRAMUSCULAR | Status: AC
  Filled 2021-11-27: qty 2

## 2021-11-27 MED ORDER — MIDAZOLAM HCL 5 MG/5ML IJ SOLN
INTRAMUSCULAR | Status: AC | PRN
  Administered 2021-11-27: 1 mg via INTRAVENOUS

## 2021-11-27 MED ORDER — SODIUM CHLORIDE 0.9 % IV SOLN: INTRAVENOUS | Status: DC

## 2021-11-27 MED ORDER — FENTANYL CITRATE (PF) 100 MCG/2ML IJ SOLN
INTRAMUSCULAR | Status: AC | PRN
  Administered 2021-11-27: 50 ug via INTRAVENOUS

## 2021-11-27 MED ORDER — LENALIDOMIDE 10 MG PO CAPS: 10.0000 mg | ORAL_CAPSULE | Freq: Every day | ORAL | 0 refills | Status: DC

## 2021-11-27 NOTE — Procedures (Signed)
Interventional Radiology Procedure Note ? ?Date of Procedure: 11/27/2021  ?Procedure: BMBx ? ?Findings:  ?1. CT BMBx right ilium   ? ?Complications: No immediate complications noted.  ? ?Estimated Blood Loss: minimal ? ?Follow-up and Recommendations: ?1. Bedrest 1 hour  ? ? ?Albin Felling, MD  ?Vascular & Interventional Radiology  ?11/27/2021 9:11 AM ? ? ? ?

## 2021-11-27 NOTE — Progress Notes (Signed)
Patient clinically stable post BMB per Dr Denna Haggard, tolerated well. Received Versed 1 mg along with Fentanyl 50 mcg IV for procedure. Report given to Carlynn Spry RN post procedure/specials. Denies complaints presently. ?

## 2021-12-02 DIAGNOSIS — D2271 Melanocytic nevi of right lower limb, including hip: Secondary | ICD-10-CM | POA: Diagnosis not present

## 2021-12-02 DIAGNOSIS — D2272 Melanocytic nevi of left lower limb, including hip: Secondary | ICD-10-CM | POA: Diagnosis not present

## 2021-12-02 DIAGNOSIS — D2262 Melanocytic nevi of left upper limb, including shoulder: Secondary | ICD-10-CM | POA: Diagnosis not present

## 2021-12-02 DIAGNOSIS — X32XXXA Exposure to sunlight, initial encounter: Secondary | ICD-10-CM | POA: Diagnosis not present

## 2021-12-02 DIAGNOSIS — D225 Melanocytic nevi of trunk: Secondary | ICD-10-CM | POA: Diagnosis not present

## 2021-12-02 DIAGNOSIS — L814 Other melanin hyperpigmentation: Secondary | ICD-10-CM | POA: Diagnosis not present

## 2021-12-02 DIAGNOSIS — D2261 Melanocytic nevi of right upper limb, including shoulder: Secondary | ICD-10-CM | POA: Diagnosis not present

## 2021-12-02 DIAGNOSIS — L821 Other seborrheic keratosis: Secondary | ICD-10-CM | POA: Diagnosis not present

## 2021-12-07 ENCOUNTER — Encounter (HOSPITAL_COMMUNITY): Payer: Self-pay | Admitting: Internal Medicine

## 2021-12-12 ENCOUNTER — Other Ambulatory Visit: Payer: Self-pay | Admitting: Internal Medicine

## 2021-12-12 ENCOUNTER — Encounter: Payer: Self-pay | Admitting: Internal Medicine

## 2021-12-12 NOTE — Progress Notes (Signed)
Recommend aranesp 500 mcg q 2w; with revlimid. Discussed with pt.  ?

## 2021-12-12 NOTE — Progress Notes (Signed)
I reviewed with the patient the results of the bone marrow biopsy. No obvious progression of disease noted. Discussed the option of. Luspatercept vs- increasing the frequency of Aranesp to every two weeks; continue Revlimid. Patient is in agreement in proceeding with latter option. ? ?With regards to palpitation, would recommend further evaluation with- PCP/ cardiology.

## 2021-12-14 ENCOUNTER — Encounter (HOSPITAL_COMMUNITY): Payer: Self-pay | Admitting: Internal Medicine

## 2021-12-22 ENCOUNTER — Inpatient Hospital Stay: Payer: PPO

## 2021-12-22 ENCOUNTER — Other Ambulatory Visit: Payer: Self-pay

## 2021-12-22 ENCOUNTER — Inpatient Hospital Stay: Payer: PPO | Attending: Internal Medicine

## 2021-12-22 ENCOUNTER — Encounter: Payer: Self-pay | Admitting: Nurse Practitioner

## 2021-12-22 ENCOUNTER — Inpatient Hospital Stay (HOSPITAL_BASED_OUTPATIENT_CLINIC_OR_DEPARTMENT_OTHER): Payer: PPO | Admitting: Nurse Practitioner

## 2021-12-22 VITALS — BP 109/73 | HR 65 | Temp 96.7°F | Resp 16 | Wt 146.2 lb

## 2021-12-22 DIAGNOSIS — D4620 Refractory anemia with excess of blasts, unspecified: Secondary | ICD-10-CM

## 2021-12-22 DIAGNOSIS — D461 Refractory anemia with ring sideroblasts: Secondary | ICD-10-CM | POA: Insufficient documentation

## 2021-12-22 LAB — COMPREHENSIVE METABOLIC PANEL
ALT: 14 U/L (ref 0–44)
AST: 20 U/L (ref 15–41)
Albumin: 4.1 g/dL (ref 3.5–5.0)
Alkaline Phosphatase: 42 U/L (ref 38–126)
Anion gap: 9 (ref 5–15)
BUN: 17 mg/dL (ref 8–23)
CO2: 23 mmol/L (ref 22–32)
Calcium: 8.5 mg/dL — ABNORMAL LOW (ref 8.9–10.3)
Chloride: 105 mmol/L (ref 98–111)
Creatinine, Ser: 0.9 mg/dL (ref 0.44–1.00)
GFR, Estimated: >60 mL/min (ref >60)
Glucose, Bld: 121 mg/dL — ABNORMAL HIGH (ref 70–99)
Potassium: 3.3 mmol/L — ABNORMAL LOW (ref 3.5–5.1)
Sodium: 137 mmol/L (ref 135–145)
Total Bilirubin: 1.3 mg/dL — ABNORMAL HIGH (ref 0.3–1.2)
Total Protein: 7.8 g/dL (ref 6.5–8.1)

## 2021-12-22 LAB — CBC WITH DIFFERENTIAL/PLATELET
Abs Immature Granulocytes: 0.01 10*3/uL (ref 0.00–0.07)
Basophils Absolute: 0.1 10*3/uL (ref 0.0–0.1)
Basophils Relative: 2 %
Eosinophils Absolute: 0.4 10*3/uL (ref 0.0–0.5)
Eosinophils Relative: 11 %
HCT: 25.2 % — ABNORMAL LOW (ref 36.0–46.0)
Hemoglobin: 8.2 g/dL — ABNORMAL LOW (ref 12.0–15.0)
Immature Granulocytes: 0 %
Lymphocytes Relative: 28 %
Lymphs Abs: 0.9 10*3/uL (ref 0.7–4.0)
MCH: 36 pg — ABNORMAL HIGH (ref 26.0–34.0)
MCHC: 32.5 g/dL (ref 30.0–36.0)
MCV: 110.5 fL — ABNORMAL HIGH (ref 80.0–100.0)
Monocytes Absolute: 0.5 10*3/uL (ref 0.1–1.0)
Monocytes Relative: 14 %
Neutro Abs: 1.4 10*3/uL — ABNORMAL LOW (ref 1.7–7.7)
Neutrophils Relative %: 45 %
Platelets: 202 10*3/uL (ref 150–400)
RBC: 2.28 MIL/uL — ABNORMAL LOW (ref 3.87–5.11)
RDW: 21.1 % — ABNORMAL HIGH (ref 11.5–15.5)
WBC: 3.2 10*3/uL — ABNORMAL LOW (ref 4.0–10.5)
nRBC: 0 % (ref 0.0–0.2)

## 2021-12-22 LAB — SAMPLE TO BLOOD BANK

## 2021-12-22 LAB — LACTATE DEHYDROGENASE: LDH: 89 U/L — ABNORMAL LOW (ref 98–192)

## 2021-12-22 MED ORDER — DARBEPOETIN ALFA 500 MCG/ML IJ SOSY
500.0000 ug | PREFILLED_SYRINGE | Freq: Once | INTRAMUSCULAR | Status: AC
  Administered 2021-12-22: 500 ug via SUBCUTANEOUS
  Filled 2021-12-22: qty 1

## 2021-12-22 NOTE — Progress Notes (Signed)
Carla [name] ?CONSULT NOTE ? ?Patient Care Team: ?[name], [name], MD as PCP - General ?Cammie Sickle, MD as Consulting Physician (Hematology and Oncology) ? ?CHIEF COMPLAINTS/PURPOSE OF CONSULTATION: MDS ? ?Oncology History Overview Note  ? ?# July 2020-myelodysplastic syndrome with ringed sideroblasts-Normal karyotype; no blasts [IPSS R-Very low risk ~median survival 8.3 years]; iron studies I62 folic acid myeloma panel normal; pyridoxine levels/copper/zinc-WNL. Erythropoietin levels-60. II OPINION at [facility] [Dr.DeCastro] ? ?# [facility]/ NGS: TET2([id number])c.2524delT(p.Ser842GlnfsTer31) Exon 3 frame-shift SF3B1([id number])c.2098A>G(p.Lys700Glu) Exon 15 missense  ?DNMT3A([id number])c.2204A>G(p.Tyr735Cys) Exon 19 missense  ? ?# JAN 11th 2020- Aranesp/retacrit; ? ?# July 30th, 2021- START REVLIMID 10 mg/day  [SF3B17mtation] ? 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 ?# colonoscopy- 2016 [Dr.Bucinni]; ? ?DIAGNOSIS: MDS/low-grade with ringed sideroblasts ? ?STAGE: Low       ;  GOALS: Control ? ?CURRENT/MOST RECENT THERAPY : EPO agent+ REV ? ?  ?MDS (myelodysplastic syndrome), low grade ([place]  ?04/23/2019 Initial Diagnosis  ? MDS (myelodysplastic syndrome), low grade ([facility] ?  ? ? ?HISTORY OF PRESENTING ILLNESS: Accompanied by husband.  Ambulating independently. ? ?[name] history of low-grade MDS anemia with ring sideroblasts currently on Aranesp/Revlimid is here for follow-up and consideration of aranesp and discussion of bone marrow biopsy results. Continues to have exertional shortness of breath and fatigue. No cough. Diarrhea is intermittent.  ? ? ?Review of Systems  ?Constitutional:  Positive for malaise/fatigue. Negative for chills, diaphoresis and fever.  ?HENT:  Negative for nosebleeds and sore throat.   ?Eyes:  Negative for double vision.  ?Respiratory:  Negative for cough, hemoptysis, sputum production, shortness of breath and  wheezing.   ?Cardiovascular:  Negative for chest pain and orthopnea.  ?Gastrointestinal:  Negative for abdominal pain, blood in stool, constipation, diarrhea, heartburn, melena and vomiting.  ?Genitourinary:  Negative for dysuria, frequency and urgency.  ?Musculoskeletal:  Positive for back pain and joint pain.  ?Skin: Negative.  Negative for itching and rash.  ?Neurological:  Negative for dizziness, tingling, focal weakness, weakness and headaches.  ?Endo/Heme/Allergies:  Does not bruise/bleed easily.  ?Psychiatric/Behavioral:  Negative for depression. The patient is not nervous/anxious and does not have insomnia.   ? ?MEDICAL HISTORY:  ?Past Medical History:  ?Diagnosis Date  ? Allergic rhinitis, cause unspecified   ? Anemia, unspecified   ? Carpal tunnel syndrome   ? Cystitis, unspecified   ? Family history of osteoporosis   ? PONV (postoperative nausea and vomiting)   ? Postnasal drip   ? Syncope and collapse   ? ? ?SURGICAL HISTORY: ?Past Surgical History:  ?Procedure Laterality Date  ? BONE MARROW BIOPSY    ? CARPAL TUNNEL RELEASE    ? COLONOSCOPY  09/28/2003  ? COLONOSCOPY WITH PROPOFOL N/A 07/31/2015  ? Procedure: COLONOSCOPY WITH PROPOFOL;  Surgeon: RRonald Lobo MD;  Location: [facility];  Service: Endoscopy;  Laterality: N/A;  ? DIAGNOSTIC LAPAROSCOPY    ? dx lack of pregnancy   ? EYE SURGERY    ? lasik, cataract, prk,yag procedure  ? ? ?SOCIAL HISTORY: ?Social History  ? ?Socioeconomic History  ? Marital status: Married  ?  Spouse name: Not on file  ? Number of children: Not on file  ? Years of education: Not on file  ? Highest education level: Not on file  ?Occupational History  ? Not on file  ?Tobacco Use  ? Smoking status: Never  ? Smokeless tobacco: Never  ?Vaping Use  ? Vaping Use: Never used  ?  Substance and Sexual Activity  ? Alcohol use: Yes  ?  Alcohol/week: 0.0 standard drinks  ?  Comment: Rare  ? Drug use: No  ? Sexual activity: Not Currently  ?Other Topics Concern  ? Not on file  ?Social  History Narrative  ? No regular exercise  ?   ? Drinks lots of [employer]  ?   ?   ? ?Social Determinants of Health  ? ?Financial Resource Strain: Not on file  ?Food Insecurity: Not on file  ?Transportation Needs: Not on file  ?Physical Activity: Not on file  ?Stress: Not on file  ?Social Connections: Not on file  ?Intimate Partner Violence: Not on file  ? ? ?FAMILY HISTORY: ?Family History  ?Problem Relation Age of Onset  ? Osteoporosis Mother   ? Stroke Mother   ? Coronary artery disease Father   ? Stroke Father 81  ? Diabetes Other   ?     Aunts and uncles  ? Breast cancer Paternal Aunt   ? Breast cancer Maternal Aunt   ? Arthritis/Rheumatoid Child   ? Breast cancer Cousin   ? ? ?ALLERGIES:  is allergic to codeine. ? ?MEDICATIONS:  ?Current Outpatient Medications  ?Medication Sig Dispense Refill  ? acetaminophen (TYLENOL) 500 MG tablet Take 500-1,000 mg by mouth every 6 (six) hours as needed for mild pain or headache.    ? aspirin EC 81 MG tablet Take 81 mg by mouth at bedtime. Swallow whole.     ? Calcium Carb-Cholecalciferol (CALCIUM-VITAMIN D3) 600-10 MG-MCG CAPS Take by mouth. Taking twice dailly    ? Darbepoetin Alfa (ARANESP) 500 MCG/ML SOSY injection Inject 500 mcg into the skin See admin instructions. Inject 500 mcg into the skin every week    ? lenalidomide (REVLIMID) 10 MG capsule Take 1 capsule (10 mg total) by mouth daily. Take for 21 days, then hold for 7 days. Repeat every 28 days. 21 capsule 0  ? levothyroxine (SYNTHROID) 25 MCG tablet TAKE 1 TABLET BY MOUTH ONCE A DAY BEFOREBREAKFAST. 90 tablet 1  ? ondansetron (ZOFRAN ODT) 8 MG disintegrating tablet Take 1 tablet (8 mg total) by mouth every 8 (eight) hours as needed for nausea or vomiting. 30 tablet 0  ? ?No current facility-administered medications for this visit.  ? ? ? ? ?PHYSICAL EXAMINATION: ? ? ?Vitals:  ? 12/22/21 0929  ?BP: 109/73  ?Pulse: 65  ?Resp: 16  ?Temp: (!) 96.7 ?F (35.9 ?C)  ?SpO2: 100%  ? ? ?Filed Weights  ? 12/22/21 0929  ?Weight:  146 lb 3.2 oz (66.3 kg)  ? ? ? ?Physical Exam ?HENT:  ?   Head: Normocephalic and atraumatic.  ?   Mouth/Throat:  ?   Pharynx: No oropharyngeal exudate.  ?Eyes:  ?   Pupils: Pupils are equal, round, and reactive to light.  ?Cardiovascular:  ?   Rate and Rhythm: Normal rate and regular rhythm.  ?   Pulses: Normal pulses.  ?   Heart sounds: Normal heart sounds.  ?Pulmonary:  ?   Effort: Pulmonary effort is normal. No respiratory distress.  ?   Breath sounds: Normal breath sounds. No wheezing.  ?Abdominal:  ?   General: Bowel sounds are normal. There is no distension.  ?   Palpations: Abdomen is soft. There is no mass.  ?   Tenderness: There is no abdominal tenderness. There is no guarding or rebound.  ?Musculoskeletal:     ?   General: No tenderness. Normal range of motion.  ?  Cervical back: Normal range of motion and neck supple.  ?Skin: ?   General: Skin is warm.  ?Neurological:  ?   Mental Status: She is alert and oriented to person, place, and time.  ?Psychiatric:     ?   Mood and Affect: Affect normal.  ? ? ?LABORATORY DATA:  ?I have reviewed the data as listed ?Lab Results  ?Component Value Date  ? WBC 3.2 (L) 12/22/2021  ? HGB 8.2 (L) 12/22/2021  ? HCT 25.2 (L) 12/22/2021  ? MCV 110.5 (H) 12/22/2021  ? PLT 202 12/22/2021  ? ?Recent Labs  ?  10/27/21 ?7207 11/24/21 ?2182 12/22/21 ?0908  ?NA 138 137 137  ?K 3.4* 3.8 3.3*  ?CL 103 103 105  ?CO2 _0 ?GLUCOSE 146* 119* 121*  ?BUN _1 ?CREATININE 0.94 0.95 0.90  ?CALCIUM 9.0 8.9 8.5*  ?GFRNONAA >60 >60 >60  ?PROT 7.7 8.2* 7.8  ?ALBUMIN 4.0 4.2 4.1  ?AST _2 ?ALT _3 ?ALKPHOS 48 43 42  ?BILITOT 0.8 1.2 1.3*  ? ? ? ? ?CT BONE MARROW BIOPSY & ASPIRATION ? ?Result Date: 11/27/2021 ?INDICATION: MDS EXAM: CT BONE MARROW BIOPSY AND ASPIRATION MEDICATIONS: None. ANESTHESIA/SEDATION: Moderate (conscious) sedation was employed during this procedure. A total of Versed 1 mg and Fentanyl 50 mcg was administered intravenously. Moderate Sedation Time: 10  minutes. The patient's level of consciousness and vital signs were monitored continuously by radiology nursing throughout the procedure under my direct supervision. FLUOROSCOPY TIME:  N/a COMPLICATIONS: None immed

## 2021-12-22 NOTE — Progress Notes (Signed)
Pt and husband in for follow up.  Pt reports has stomach issues off and on.   ?

## 2021-12-25 ENCOUNTER — Other Ambulatory Visit: Payer: Self-pay | Admitting: *Deleted

## 2021-12-25 DIAGNOSIS — D46Z Other myelodysplastic syndromes: Secondary | ICD-10-CM

## 2021-12-25 MED ORDER — LENALIDOMIDE 10 MG PO CAPS: 10.0000 mg | ORAL_CAPSULE | Freq: Every day | ORAL | 0 refills | Status: DC

## 2021-12-28 IMAGING — CT CT ANGIO CHEST
2 of 6 series · 19 of 46 positions shown · IV contrast (APPLIED)
Comparison: None.

CLINICAL DATA: Shortness of breath and cough with chest pain.

EXAM:
CT ANGIOGRAPHY CHEST WITH CONTRAST
TECHNIQUE: Multidetector CT imaging of the chest was performed using the
standard protocol during bolus administration of intravenous
contrast. Multiplanar CT image reconstructions and MIPs were
obtained to evaluate the vascular anatomy.
CONTRAST:  50mL OMNIPAQUE IOHEXOL 350 MG/ML SOLN

[Series 7: thins · axial · 0.58mm/px · z∈[+15,+247]mm · 16 of 363 slices shown]
[im 16/363  lung]
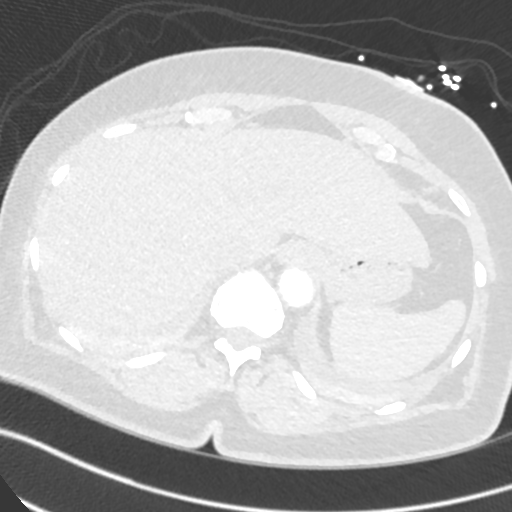
[im 48/363  soft-tissue]
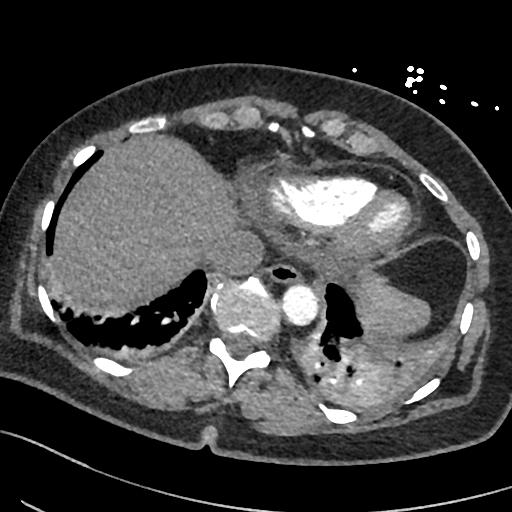
[im 63/363  lung]
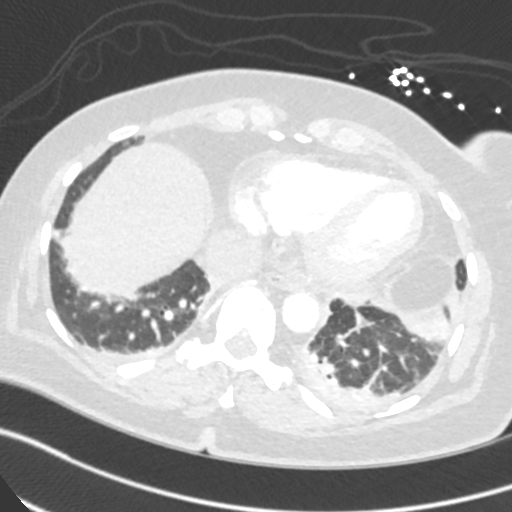
[im 79/363  soft-tissue]
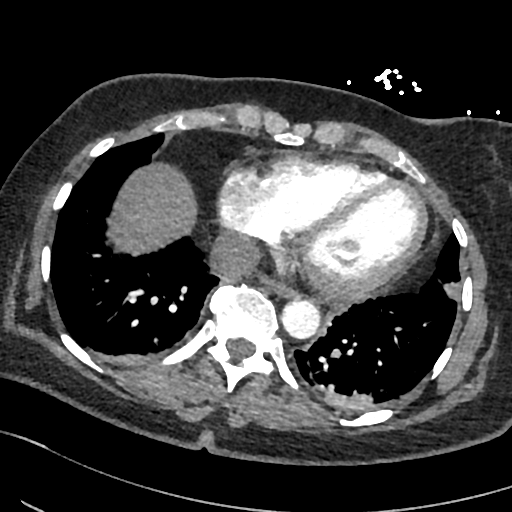
[im 111/363  lung]
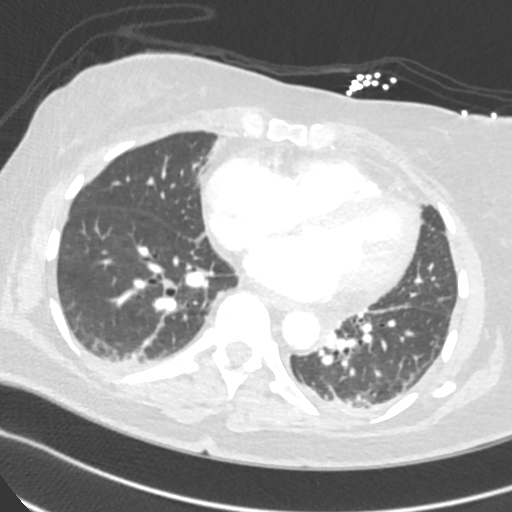
[im 126/363  soft-tissue]
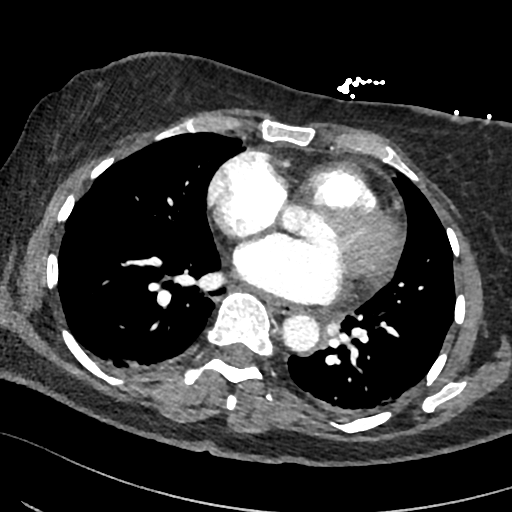
[im 142/363  lung]
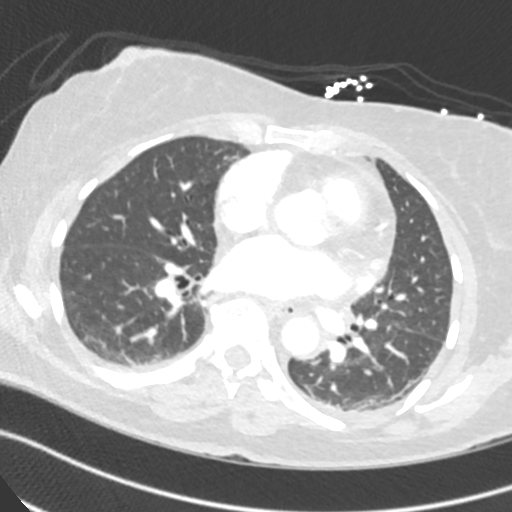
[im 174/363  soft-tissue]
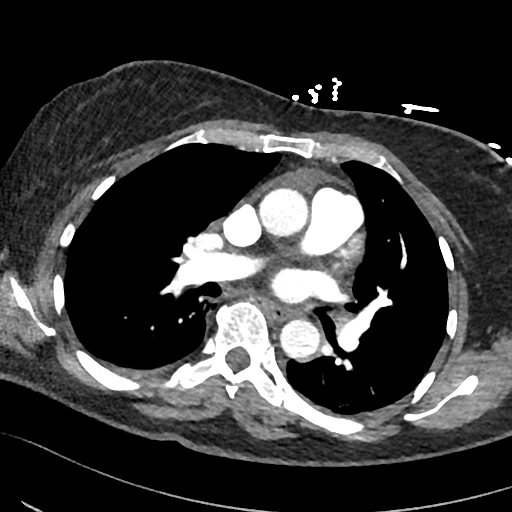
[im 189/363  lung]
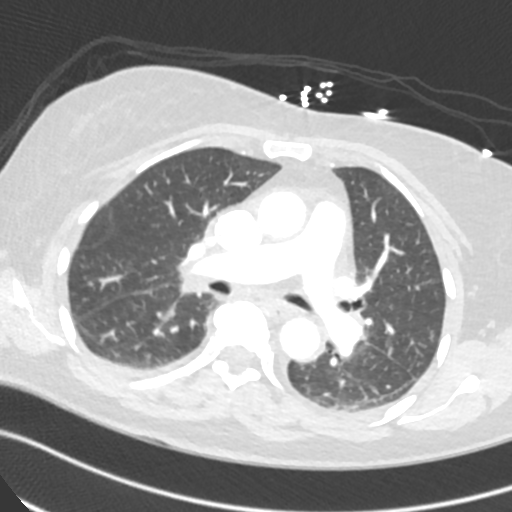
[im 221/363  soft-tissue]
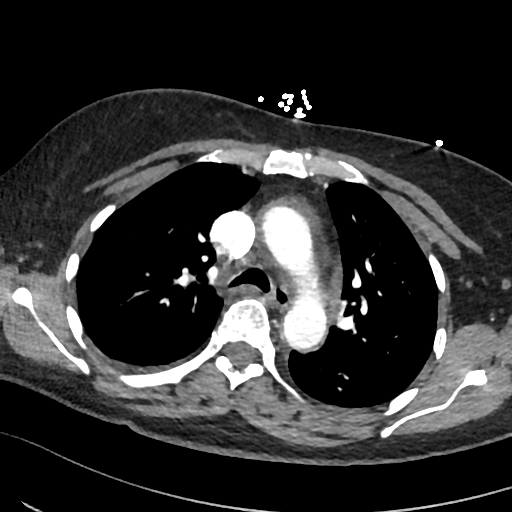
[im 237/363  lung]
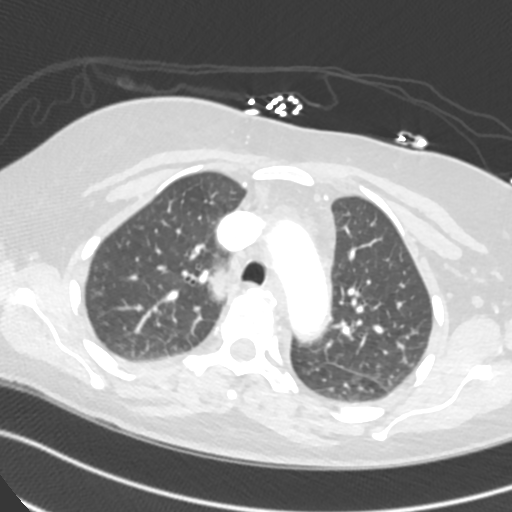
[im 252/363  soft-tissue]
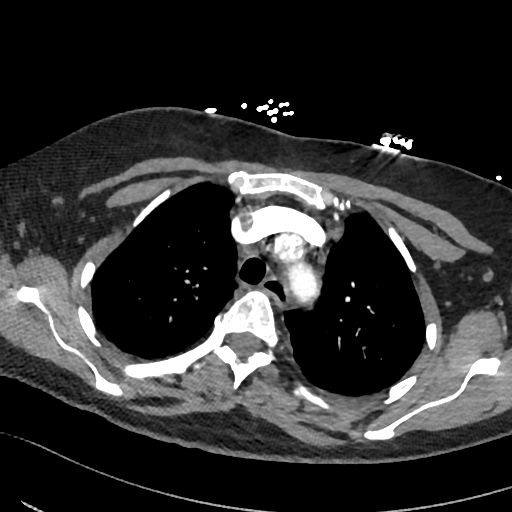
[im 284/363  lung]
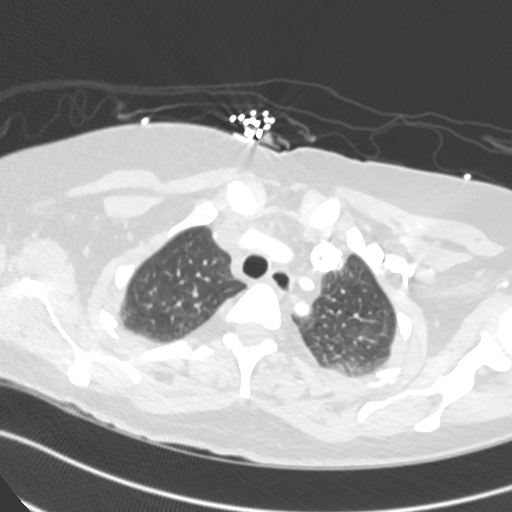
[im 300/363  soft-tissue]
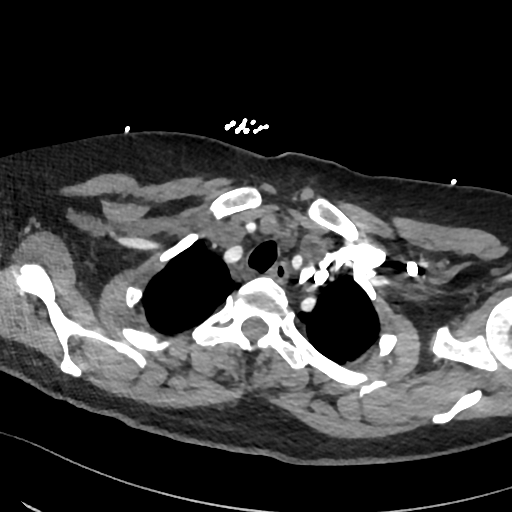
[im 315/363  lung]
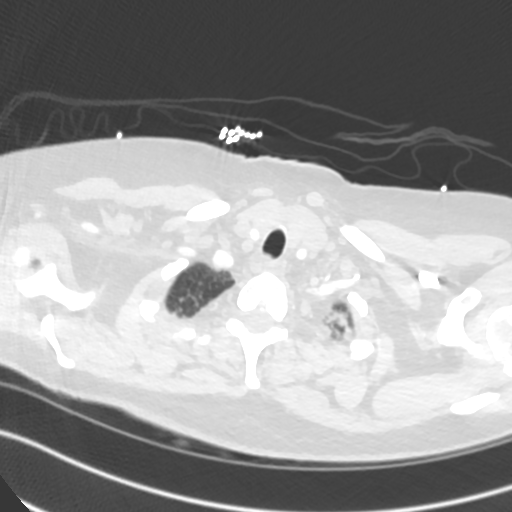
[im 347/363  soft-tissue]
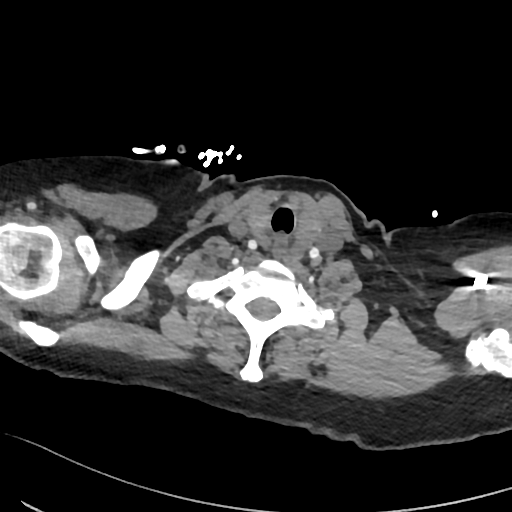

[Series 8: cor · coronal · 0.53mm/px · 3 of 90 slices shown]
[im 23/90  soft-tissue]
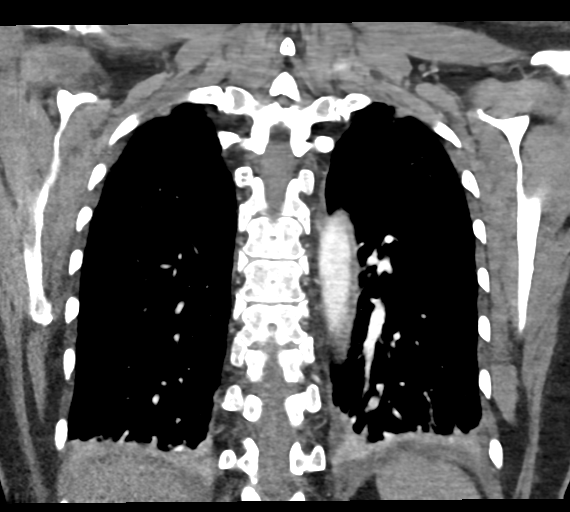
[im 45/90  soft-tissue]
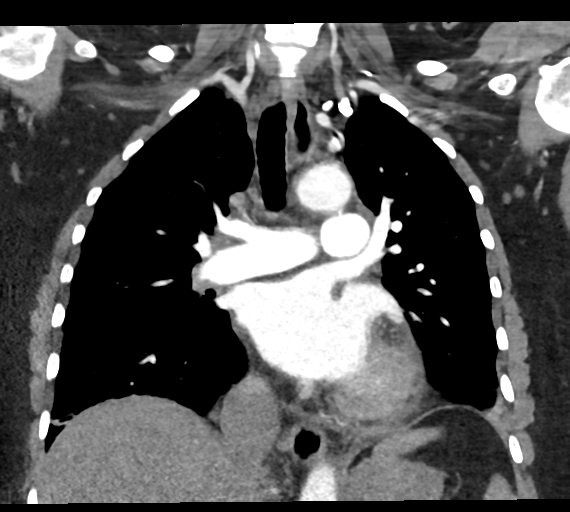
[im 67/90  soft-tissue]
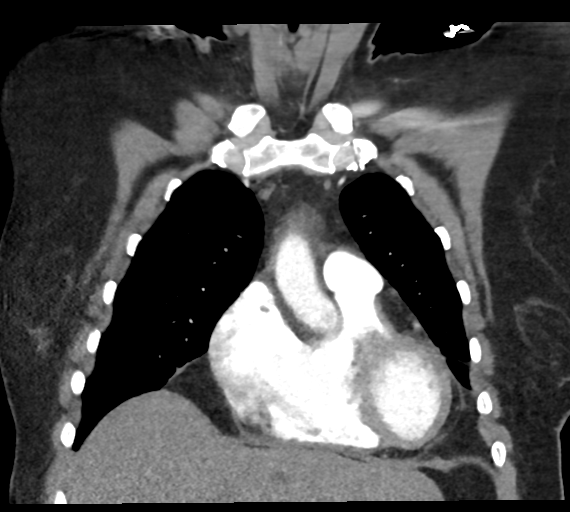

[19 of 46 positions shown; findings below may reference images not displayed]

FINDINGS: Cardiovascular: Satisfactory opacification of the pulmonary arteries
to the segmental level. No evidence of pulmonary embolism. Normal
heart size. No pericardial effusion.

Mediastinum/Nodes: There is mild right hilar lymphadenopathy.
Thyroid gland, trachea, and esophagus demonstrate no significant
findings.

Lungs/Pleura: Mild linear scarring and/or atelectasis is seen within
the inferior aspect of the left upper lobe and posterior aspect of
the bilateral lower lobes.

A trace amount of pleural fluid is seen, bilaterally.

There is no evidence of pneumothorax.

Upper Abdomen: No acute abnormality.

Musculoskeletal: No chest wall abnormality. No acute or significant
osseous findings.

Review of the MIP images confirms the above findings.
IMPRESSION: 1. No evidence of pulmonary embolism.
2. Trace amount of pleural fluid, bilaterally.
3. Mild linear scarring and/or atelectasis within the inferior
aspect of the left upper lobe and posterior aspect of the bilateral
lower lobes.

## 2022-01-05 ENCOUNTER — Inpatient Hospital Stay: Payer: PPO

## 2022-01-05 ENCOUNTER — Inpatient Hospital Stay: Payer: PPO | Attending: Internal Medicine

## 2022-01-05 VITALS — BP 120/41 | HR 58

## 2022-01-05 DIAGNOSIS — D46Z Other myelodysplastic syndromes: Secondary | ICD-10-CM

## 2022-01-05 DIAGNOSIS — D461 Refractory anemia with ring sideroblasts: Secondary | ICD-10-CM | POA: Diagnosis not present

## 2022-01-05 LAB — CBC WITH DIFFERENTIAL/PLATELET
Abs Immature Granulocytes: 0.06 10*3/uL (ref 0.00–0.07)
Basophils Absolute: 0.1 10*3/uL (ref 0.0–0.1)
Basophils Relative: 3 %
Eosinophils Absolute: 0.2 10*3/uL (ref 0.0–0.5)
Eosinophils Relative: 7 %
HCT: 25.9 % — ABNORMAL LOW (ref 36.0–46.0)
Hemoglobin: 8.3 g/dL — ABNORMAL LOW (ref 12.0–15.0)
Immature Granulocytes: 2 %
Lymphocytes Relative: 29 %
Lymphs Abs: 1 10*3/uL (ref 0.7–4.0)
MCH: 35.9 pg — ABNORMAL HIGH (ref 26.0–34.0)
MCHC: 32 g/dL (ref 30.0–36.0)
MCV: 112.1 fL — ABNORMAL HIGH (ref 80.0–100.0)
Monocytes Absolute: 0.8 10*3/uL (ref 0.1–1.0)
Monocytes Relative: 23 %
Neutro Abs: 1.3 10*3/uL — ABNORMAL LOW (ref 1.7–7.7)
Neutrophils Relative %: 36 %
Platelets: 265 10*3/uL (ref 150–400)
RBC: 2.31 MIL/uL — ABNORMAL LOW (ref 3.87–5.11)
RDW: 22.4 % — ABNORMAL HIGH (ref 11.5–15.5)
WBC: 3.4 10*3/uL — ABNORMAL LOW (ref 4.0–10.5)
nRBC: 0.9 % — ABNORMAL HIGH (ref 0.0–0.2)

## 2022-01-05 MED ORDER — DARBEPOETIN ALFA 500 MCG/ML IJ SOSY
500.0000 ug | PREFILLED_SYRINGE | Freq: Once | INTRAMUSCULAR | Status: AC
  Administered 2022-01-05: 500 ug via SUBCUTANEOUS
  Filled 2022-01-05: qty 1

## 2022-01-06 ENCOUNTER — Encounter: Payer: Self-pay | Admitting: Internal Medicine

## 2022-01-11 ENCOUNTER — Other Ambulatory Visit: Payer: PPO

## 2022-01-11 LAB — SURGICAL PATHOLOGY

## 2022-01-19 ENCOUNTER — Inpatient Hospital Stay (HOSPITAL_BASED_OUTPATIENT_CLINIC_OR_DEPARTMENT_OTHER): Payer: PPO | Admitting: Internal Medicine

## 2022-01-19 ENCOUNTER — Inpatient Hospital Stay: Payer: PPO

## 2022-01-19 VITALS — BP 110/42 | HR 55 | Temp 96.9°F | Ht 64.0 in | Wt 144.0 lb

## 2022-01-19 DIAGNOSIS — D46Z Other myelodysplastic syndromes: Secondary | ICD-10-CM

## 2022-01-19 DIAGNOSIS — G8929 Other chronic pain: Secondary | ICD-10-CM

## 2022-01-19 DIAGNOSIS — D461 Refractory anemia with ring sideroblasts: Secondary | ICD-10-CM | POA: Diagnosis not present

## 2022-01-19 DIAGNOSIS — M25511 Pain in right shoulder: Secondary | ICD-10-CM

## 2022-01-19 LAB — COMPREHENSIVE METABOLIC PANEL
ALT: 15 U/L (ref 0–44)
AST: 19 U/L (ref 15–41)
Albumin: 4.3 g/dL (ref 3.5–5.0)
Alkaline Phosphatase: 41 U/L (ref 38–126)
Anion gap: 5 (ref 5–15)
BUN: 17 mg/dL (ref 8–23)
CO2: 26 mmol/L (ref 22–32)
Calcium: 8.7 mg/dL — ABNORMAL LOW (ref 8.9–10.3)
Chloride: 104 mmol/L (ref 98–111)
Creatinine, Ser: 0.98 mg/dL (ref 0.44–1.00)
GFR, Estimated: >60 mL/min (ref >60)
Glucose, Bld: 115 mg/dL — ABNORMAL HIGH (ref 70–99)
Potassium: 3.8 mmol/L (ref 3.5–5.1)
Sodium: 135 mmol/L (ref 135–145)
Total Bilirubin: 1.2 mg/dL (ref 0.3–1.2)
Total Protein: 8 g/dL (ref 6.5–8.1)

## 2022-01-19 LAB — CBC WITH DIFFERENTIAL/PLATELET
Abs Immature Granulocytes: 0.02 10*3/uL (ref 0.00–0.07)
Basophils Absolute: 0.1 10*3/uL (ref 0.0–0.1)
Basophils Relative: 2 %
Eosinophils Absolute: 0.5 10*3/uL (ref 0.0–0.5)
Eosinophils Relative: 15 %
HCT: 27.7 % — ABNORMAL LOW (ref 36.0–46.0)
Hemoglobin: 9 g/dL — ABNORMAL LOW (ref 12.0–15.0)
Immature Granulocytes: 1 %
Lymphocytes Relative: 32 %
Lymphs Abs: 1 10*3/uL (ref 0.7–4.0)
MCH: 35.7 pg — ABNORMAL HIGH (ref 26.0–34.0)
MCHC: 32.5 g/dL (ref 30.0–36.0)
MCV: 109.9 fL — ABNORMAL HIGH (ref 80.0–100.0)
Monocytes Absolute: 0.4 10*3/uL (ref 0.1–1.0)
Monocytes Relative: 13 %
Neutro Abs: 1.2 10*3/uL — ABNORMAL LOW (ref 1.7–7.7)
Neutrophils Relative %: 37 %
Platelets: 204 10*3/uL (ref 150–400)
RBC: 2.52 MIL/uL — ABNORMAL LOW (ref 3.87–5.11)
RDW: 22.5 % — ABNORMAL HIGH (ref 11.5–15.5)
WBC: 3.2 10*3/uL — ABNORMAL LOW (ref 4.0–10.5)
nRBC: 0.6 % — ABNORMAL HIGH (ref 0.0–0.2)

## 2022-01-19 LAB — LACTATE DEHYDROGENASE: LDH: 96 U/L — ABNORMAL LOW (ref 98–192)

## 2022-01-19 MED ORDER — DARBEPOETIN ALFA 500 MCG/ML IJ SOSY
500.0000 ug | PREFILLED_SYRINGE | Freq: Once | INTRAMUSCULAR | Status: AC
  Administered 2022-01-19: 500 ug via SUBCUTANEOUS
  Filled 2022-01-19: qty 1

## 2022-01-19 NOTE — Progress Notes (Signed)
[place] ?CONSULT NOTE ? ?Patient Care Team: ?[name], [name], MD as PCP - General ?Cammie Sickle, MD as Consulting Physician (Hematology and Oncology) ? ?CHIEF COMPLAINTS/PURPOSE OF CONSULTATION: MDS ? ? ?Oncology History Overview Note  ? ?# July 2020-myelodysplastic syndrome with ringed sideroblasts-Normal karyotype; no blasts [IPSS R-Very low risk ~median survival 8.3 years]; iron studies I45 folic acid myeloma panel normal; pyridoxine levels/copper/zinc-WNL. Erythropoietin levels-60. II OPINION at [facility] [Dr.DeCastro] ? ?# [facility]/ NGS: TET2([id number])c.2524delT(p.Ser842GlnfsTer31) Exon 3 frame-shift SF3B1([id number])c.2098A>G(p.Lys700Glu) Exon 15 missense  ?DNMT3A([id number])c.2204A>G(p.Tyr735Cys) Exon 19 missense  ? ?# JAN 11th 2020- Aranesp/retacrit; ? ?# July 30th, 2021- START REVLIMID 10 mg/day  [SF3B36mtation] ? 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 ?# MARCh 2023-increase the frequency of Aranesp 400 mcg every 2 weeks; continue Revlimid.  ? 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 ?# colonoscopy- 2016 [Dr.Bucinni]; ? ?DIAGNOSIS: MDS/low-grade with ringed sideroblasts ? ?STAGE: Low       ;  GOALS: Control ? ?CURRENT/MOST RECENT THERAPY : EPO agent+ REV ? ?  ?MDS (myelodysplastic syndrome), low grade ([facility]  ?04/23/2019 Initial Diagnosis  ? [place] ([place]el[place]ysplastic [place]), low grade ([place] ?  ? ? ?HISTORY OF PRESENTING ILLNESS: Accompanied by husband.  Ambulating independently. ? ?Carla McgaheeMay 70 y.o.  female history of low-grade MDS anemia with ring sideroblasts currently on Aranesp/Revlimid is here for follow-up/review results of the bone marrow biopsy. ? ?Patient continues to have intermittent palpitations.  However, recommend to proceed with cardiology evaluation. ? ?Also complains of right shoulder/scapular  pain.-  ? ?Chronic mild shortness of breath on exertion.  Otherwise denies no fever no chills.  No cough. ? ?No nausea no vomiting. Chronic intermittent mild diarrhea. ? ?Review of Systems  ?Constitutional:  Positive for malaise/fatigue. Negative for chills, diaphoresis and fever.  ?HENT:  Negative for nosebleeds and sore throat.   ?Eyes:  Negative for double vision.  ?Respiratory:  Negative for cough, hemoptysis, sputum production, shortness of breath and wheezing.   ?Cardiovascular:  Negative for chest pain and orthopnea.  ?Gastrointestinal:  Negative for abdominal pain, blood in stool, constipation, diarrhea, heartburn, melena and vomiting.  ?Genitourinary:  Negative for dysuria, frequency and urgency.  ?Musculoskeletal:  Positive for back pain and joint pain.  ?Skin: Negative.  Negative for itching and rash.  ?Neurological:  Negative for dizziness, tingling, focal weakness, weakness and headaches.  ?Endo/Heme/Allergies:  Does not bruise/bleed easily.  ?Psychiatric/Behavioral:  Negative for depression. The patient is not nervous/anxious and does not have insomnia.   ? ?MEDICAL HISTORY:  ?Past Medical History:  ?Diagnosis Date  ? Allergic rhinitis, cause unspecified   ? Anemia, unspecified   ? Carpal tunnel syndrome   ? Cystitis, unspecified   ? Family history of osteoporosis   ? PONV (postoperative nausea and vomiting)   ? Postnasal drip   ? Syncope and collapse   ? ? ?SURGICAL HISTORY: ?Past Surgical History:  ?Procedure Laterality Date  ? BONE MARROW BIOPSY    ? CARPAL TUNNEL RELEASE    ? COLONOSCOPY  09/28/2003  ? COLONOSCOPY WITH PROPOFOL N/A 07/31/2015  ? Procedure: COLONOSCOPY WITH PROPOFOL;  Surgeon: RRonald Lobo MD;  Location: [facility];  Service: Endoscopy;  Laterality: N/A;  ? DIAGNOSTIC LAPAROSCOPY    ? dx lack of pregnancy   ? EYE SURGERY    ? lasik, cataract, prk,yag procedure  ? ? ?SOCIAL  HISTORY: ?Social History  ? ?Socioeconomic History  ? Marital status: Married  ?  Spouse name: Not on file   ? Number of children: Not on file  ? Years of education: Not on file  ? Highest education level: Not on file  ?Occupational History  ? Not on file  ?Tobacco Use  ? Smoking status: Never  ? Smokeless tobacco: Never  ?Vaping Use  ? Vaping Use: Never used  ?Substance and Sexual Activity  ? Alcohol use: Yes  ?  Alcohol/week: 0.0 standard drinks  ?  Comment: Rare  ? Drug use: No  ? Sexual activity: Not Currently  ?Other Topics Concern  ? Not on file  ?Social History Narrative  ? No regular exercise  ?   ? Drinks lots of [employer]  ?   ?   ? ?Social Determinants of Health  ? ?Financial Resource Strain: Not on file  ?Food Insecurity: Not on file  ?Transportation Needs: Not on file  ?Physical Activity: Not on file  ?Stress: Not on file  ?Social Connections: Not on file  ?Intimate Partner Violence: Not on file  ? ? ?FAMILY HISTORY: ?Family History  ?Problem Relation Age of Onset  ? Osteoporosis Mother   ? Stroke Mother   ? Coronary artery disease Father   ? Stroke Father 77  ? Diabetes Other   ?     Aunts and uncles  ? Breast cancer Paternal Aunt   ? Breast cancer Maternal Aunt   ? Arthritis/Rheumatoid Child   ? Breast cancer Cousin   ? ? ?ALLERGIES:  is allergic to codeine. ? ?MEDICATIONS:  ?Current Outpatient Medications  ?Medication Sig Dispense Refill  ? acetaminophen (TYLENOL) 500 MG tablet Take 500-1,000 mg by mouth every 6 (six) hours as needed for mild pain or headache.    ? aspirin EC 81 MG tablet Take 81 mg by mouth at bedtime. Swallow whole.     ? Calcium Carb-Cholecalciferol (CALCIUM-VITAMIN D3) 600-10 MG-MCG CAPS Take by mouth. Taking twice dailly    ? Darbepoetin Alfa (ARANESP) 500 MCG/ML SOSY injection Inject 500 mcg into the skin See admin instructions. Inject 500 mcg into the skin every week    ? lenalidomide (REVLIMID) 10 MG capsule Take 1 capsule (10 mg total) by mouth daily. Take for 21 days, then hold for 7 days. Repeat every 28 days. 21 capsule 0  ? levothyroxine (SYNTHROID) 25 MCG tablet TAKE 1 TABLET BY  MOUTH ONCE A DAY BEFOREBREAKFAST. 90 tablet 1  ? ondansetron (ZOFRAN ODT) 8 MG disintegrating tablet Take 1 tablet (8 mg total) by mouth every 8 (eight) hours as needed for nausea or vomiting. 30 tablet 0  ? ?No current facility-administered medications for this visit.  ? ? ? ? ?PHYSICAL EXAMINATION: ? ? ?Vitals:  ? 01/19/22 1049  ?BP: (!) 110/42  ?Pulse: (!) 55  ?Temp: (!) 96.9 ?F (36.1 ?C)  ?SpO2: 100%  ? ? ? ?Filed Weights  ? 01/19/22 1049  ?Weight: 144 lb (65.3 kg)  ? ? ? ? ?Physical Exam ?HENT:  ?   Head: Normocephalic and atraumatic.  ?   Mouth/Throat:  ?   Pharynx: No oropharyngeal exudate.  ?Eyes:  ?   Pupils: Pupils are equal, round, and reactive to light.  ?Cardiovascular:  ?   Rate and Rhythm: Normal rate and regular rhythm.  ?   Pulses: Normal pulses.  ?   Heart sounds: Normal heart sounds.  ?Pulmonary:  ?   Effort: Pulmonary effort is normal. No respiratory  distress.  ?   Breath sounds: Normal breath sounds. No wheezing.  ?Abdominal:  ?   General: Bowel sounds are normal. There is no distension.  ?   Palpations: Abdomen is soft. There is no mass.  ?   Tenderness: There is no abdominal tenderness. There is no guarding or rebound.  ?Musculoskeletal:     ?   General: No tenderness. Normal range of motion.  ?   Cervical back: Normal range of motion and neck supple.  ?Skin: ?   General: Skin is warm.  ?Neurological:  ?   Mental Status: She is alert and oriented to person, place, and time.  ?Psychiatric:     ?   Mood and Affect: Affect normal.  ? ? ?LABORATORY DATA:  ?I have reviewed the data as listed ?Lab Results  ?Component Value Date  ? WBC 3.2 (L) 01/19/2022  ? HGB 9.0 (L) 01/19/2022  ? HCT 27.7 (L) 01/19/2022  ? MCV 109.9 (H) 01/19/2022  ? PLT 204 01/19/2022  ? ?Recent Labs  ?  11/24/21 ?0903 12/22/21 ?0908 01/19/22 ?1041  ?NA 137 137 135  ?K 3.8 3.3* 3.8  ?CL 103 105 104  ?CO2 _0 ?GLUCOSE 119* 121* 115*  ?BUN _1 ?CREATININE 0.95 0.90 0.98  ?CALCIUM 8.9 8.5* 8.7*  ?GFRNONAA >60 >60 >60   ?PROT 8.2* 7.8 8.0  ?ALBUMIN 4.2 4.1 4.3  ?AST _2 ?ALT _3 ?ALKPHOS 43 42 41  ?BILITOT 1.2 1.3* 1.2  ? ? ? ?No results found. ? ?MDS (myelodysplastic syndrome), low grade ([place]) ?# Low grade M

## 2022-01-19 NOTE — Assessment & Plan Note (Addendum)
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 . Currently on Aranesp 500 mcg; subcu injections; and  lenalidomide 10 mg  3w-ON & 1 w-OFF-tolerating fairly well except for mild GI effects/mild neutropenia. MARCH 2023-bone marrow biopsy no evidence of obvious progression of MDS or any acute process. ? ?#After discussing multiple options including initiation of Luspatercept-patient agreed to proceed with Aranesp-500 mcg every 2 weeks; along with Revlimid.  Today hemoglobin is 9.3.  Proceed with Aranesp. Continue revlimid 10 mg days 1-21 with 7 days off. ? ?# Diarrhea/dyspepsis-G-1-2; sec to revlimid- monitor for now; discussed regarding use of cholestyramine- /decrease fat intake.  controlled with immodium prn. STABLE;  ? ?# Palpitations: Normal rate and rhythm- recommend PCP/cards-  ? ?# # Right shoulder pain/radiating to Right UE- [several weeks]; proceed with X-ray- continue ice/ heat for -STABLE; . Continue prn NSAIDs. ?  ?# DISPOSITION: mychart re: X-ray ?# proceed with Aranesp today ?# in 2 weeks- H&H- possible aranesp ?# in 4 weeks- MD; labs- cbc/Cmp;LDH;hold tube; possible; Aranesp SQ- Dr.B ?

## 2022-01-22 ENCOUNTER — Other Ambulatory Visit: Payer: Self-pay | Admitting: *Deleted

## 2022-01-22 DIAGNOSIS — D46Z Other myelodysplastic syndromes: Secondary | ICD-10-CM

## 2022-01-22 MED ORDER — LENALIDOMIDE 10 MG PO CAPS: 10.0000 mg | ORAL_CAPSULE | Freq: Every day | ORAL | 0 refills | Status: DC

## 2022-01-25 ENCOUNTER — Other Ambulatory Visit: Payer: Self-pay | Admitting: *Deleted

## 2022-01-25 DIAGNOSIS — D46Z Other myelodysplastic syndromes: Secondary | ICD-10-CM

## 2022-01-25 NOTE — Telephone Encounter (Signed)
Duplicate request.  [facility] Auth # [id number] Obtained 01/22/2022 provided on rx sent to [facility] on 01/22/22.   ?

## 2022-01-27 ENCOUNTER — Telehealth: Payer: Self-pay | Admitting: *Deleted

## 2022-01-27 DIAGNOSIS — D46Z Other myelodysplastic syndromes: Secondary | ICD-10-CM

## 2022-01-27 NOTE — Telephone Encounter (Signed)
Patient called stating Dr B wanted her to see cardiology and Dr Donivan Scull office told her that we need to send a referral to him for her to get an appointment. ?

## 2022-01-27 NOTE — Telephone Encounter (Signed)
Dr. Jacinto Reap would like for [place] to refer patient to cardiologist.  Will enter referral.   ?

## 2022-01-27 NOTE — Telephone Encounter (Signed)
01/19/22 MD note:  # Palpitations: Normal rate and rhythm- recommend PCP/cards-  ? ?Dr. Jacinto Reap, Did you want to refer to cardio or f/u with PCP? ?

## 2022-01-28 ENCOUNTER — Ambulatory Visit (INDEPENDENT_AMBULATORY_CARE_PROVIDER_SITE_OTHER): Payer: PPO

## 2022-01-28 ENCOUNTER — Ambulatory Visit (INDEPENDENT_AMBULATORY_CARE_PROVIDER_SITE_OTHER): Payer: PPO | Admitting: Cardiovascular Disease

## 2022-01-28 ENCOUNTER — Encounter: Payer: Self-pay | Admitting: Cardiovascular Disease

## 2022-01-28 VITALS — BP 124/60 | HR 64 | Ht 64.5 in | Wt 146.0 lb

## 2022-01-28 DIAGNOSIS — D46Z Other myelodysplastic syndromes: Secondary | ICD-10-CM | POA: Diagnosis not present

## 2022-01-28 DIAGNOSIS — D649 Anemia, unspecified: Secondary | ICD-10-CM

## 2022-01-28 DIAGNOSIS — R002 Palpitations: Secondary | ICD-10-CM | POA: Diagnosis not present

## 2022-01-28 NOTE — Patient Instructions (Addendum)
Zio monitor for palpitations ? ?For long term monitoring: research the cardiomobile ?Search for "smartphone EKG" ? ? ?Medication Instructions:  ?No changes ? ?If you need a refill on your cardiac medications before your next appointment, please call your pharmacy.  ? ?Lab work: ?No new labs needed ? ?Testing/Procedures: ? ?Your physician has recommended that you wear a Zio XT monitor for 2 weeks. This will be mailed to your home address in 4-5 business days.  ? ?This monitor is a medical device that records the heart?s electrical activity. Doctors most often use these monitors to diagnose arrhythmias. Arrhythmias are problems with the speed or rhythm of the heartbeat. The monitor is a small device applied to your chest. You can wear one while you do your normal daily activities. While wearing this monitor if you have any symptoms to push the button and record what you felt. Once you have worn this monitor for the period of time provider prescribed (Usually 14 days), you will return the monitor device in the postage paid box. Once it is returned they will download the data collected and provide [place] with a report which the provider will then review and we will call you with those results. Important tips: ? ?Avoid showering during the first 24 hours of wearing the monitor. ?Avoid excessive sweating to help maximize wear time. ?Do not submerge the device, no hot tubs, and no swimming pools. ?Keep any lotions or oils away from the patch. ?After 24 hours you [place] shower with the patch on. Take brief showers with your back facing the shower head.  ?Do not remove patch once it has been placed because that will interrupt data and decrease adhesive wear time. ?Push the button when you have any symptoms and write down what you were feeling. ?Once you have completed wearing your monitor, remove and place into box which has postage paid and place in your outgoing mailbox.  ?If for some reason you have misplaced your box then call  our office and we can provide another box and/or mail it off for you. ? ? ?Follow-Up: ?At [facility], you and your health needs are our priority.  As part of our continuing mission to provide you with exceptional heart care, we have created designated Provider Care Teams.  These Care Teams include your primary Cardiologist (physician) and Advanced Practice Providers (APPs -  Physician Assistants and Nurse Practitioners) who all work together to provide you with the care you need, when you need it. ? ?You will need a follow up appointment as needed ? ?Providers on your designated Care Team:   ?Murray Hodgkins, NP ?Christell Faith, [name] ?Cadence Kathlen Mody, [name] ? ?COVID-19 Vaccine Information can be found at: [email or web address] For questions related to vaccine distribution or appointments, please email [email or web address] or call [phone].  ? ?

## 2022-01-28 NOTE — Progress Notes (Signed)
Cardiology Office Note ? ?Date:  01/28/2022  ? ?ID:  Carla [name], DOB [birth year], MRN [id number] ? ?PCP:  Abner Greenspan, MD  ? ?Chief Complaint  ?Patient presents with  ? New Patient (Initial Visit)  ?  Referred by [place] for myelodysplastic syndrome. Patient c.o irregular Heart beats at times.  Meds reviewed verbally with patient.   ? ? ?HPI:  ?[name] is a 71 year old woman with past medical history of ?MDS followed by hematology oncology ?Referred from Dr. B in oncology for consultation of her palpitations ? ?Records reviewed ?Prior echocardiogram September 2021  ?normal ejection fraction, EF 60%  ?no significant valvular heart disease at that time ? ?She reports that couple months ago, she appreciated palpitations, described as heart doing flips, ?Symptoms would come and go, [place] have presented in the setting of low blood count with hemoglobin in the 8 range ? ?Bone marrow bx few weeks ago, ?Has been stressful ? ?Since then hemoglobin now 9, not anxious, ?Palpitations seem to have somewhat improved ? ?Lab work reviewed ?Hemoglobin of 9, WBC 3.2 ? ?No significant cardiac risk factors ?Non smoker ?No diabetes ?Low-cholesterol no medication ?Not much exercise recently ?Prior hx of transfusion 8/21 in setting of PNA ? ?EKG personally reviewed by myself on todays visit ?Normal sinus rhythm with rate 64 bpm no significant ST-T wave changes ? ?PMH:   has a past medical history of Allergic rhinitis, cause unspecified, Anemia, unspecified, Carpal tunnel syndrome, Cystitis, unspecified, Family history of osteoporosis, PONV (postoperative nausea and vomiting), Postnasal drip, and Syncope and collapse. ? ?PSH:    ?Past Surgical History:  ?Procedure Laterality Date  ? BONE MARROW BIOPSY    ? CARPAL TUNNEL RELEASE    ? COLONOSCOPY  09/28/2003  ? COLONOSCOPY WITH PROPOFOL N/A 07/31/2015  ? Procedure: COLONOSCOPY WITH PROPOFOL;  Surgeon: Ronald Lobo, MD;  Location: [facility];  Service: Endoscopy;  Laterality:  N/A;  ? DIAGNOSTIC LAPAROSCOPY    ? dx lack of pregnancy   ? EYE SURGERY    ? lasik, cataract, prk,yag procedure  ? ? ?Current Outpatient Medications  ?Medication Sig Dispense Refill  ? acetaminophen (TYLENOL) 500 MG tablet Take 500-1,000 mg by mouth every 6 (six) hours as needed for mild pain or headache.    ? aspirin EC 81 MG tablet Take 81 mg by mouth at bedtime. Swallow whole.     ? Calcium Carb-Cholecalciferol (CALCIUM-VITAMIN D3) 600-10 MG-MCG CAPS Take by mouth. Taking twice dailly    ? Darbepoetin Alfa (ARANESP) 500 MCG/ML SOSY injection Inject 500 mcg into the skin See admin instructions. Inject 500 mcg into the skin every week    ? lenalidomide (REVLIMID) 10 MG capsule Take 1 capsule (10 mg total) by mouth daily. Take for 21 days, then hold for 7 days. Repeat every 28 days. 21 capsule 0  ? levothyroxine (SYNTHROID) 25 MCG tablet TAKE 1 TABLET BY MOUTH ONCE A DAY BEFOREBREAKFAST. 90 tablet 1  ? ondansetron (ZOFRAN ODT) 8 MG disintegrating tablet Take 1 tablet (8 mg total) by mouth every 8 (eight) hours as needed for nausea or vomiting. 30 tablet 0  ? ?No current facility-administered medications for this visit.  ? ? ? ?Allergies:   Codeine  ? ?Social History:  The patient  reports that she has never smoked. She has never used smokeless tobacco. She reports current alcohol use. She reports that she does not use drugs.  ? ?Family History:   family history includes Arthritis/Rheumatoid in her child; Breast  cancer in her cousin, maternal aunt, and paternal aunt; Coronary artery disease in her father; Diabetes in an other family member; Osteoporosis in her mother; Stroke in her mother; Stroke (age of onset: 29) in her father.  ? ? ?Review of Systems: ?Review of Systems  ?Constitutional: Negative.   ?HENT: Negative.    ?Respiratory: Negative.    ?Cardiovascular:  Positive for palpitations.  ?Gastrointestinal: Negative.   ?Musculoskeletal: Negative.   ?Neurological: Negative.   ?Psychiatric/Behavioral: Negative.     ?All other systems reviewed and are negative. ? ? ?PHYSICAL EXAM: ?VS:  BP 124/60 (BP Location: Left Arm, Patient Position: Sitting, Cuff Size: Normal)   Pulse 64   Ht 5' 4.5" (1.638 m)   Wt 146 lb (66.2 kg)   SpO2 98%   BMI 24.67 kg/m?  , BMI Body mass index is 24.67 kg/m?. ?GEN: Well nourished, well developed, in no acute distress ?HEENT: normal ?Neck: no JVD, carotid bruits, or masses ?Cardiac: RRR; no murmurs, rubs, or gallops,no edema  ?Respiratory:  clear to auscultation bilaterally, normal work of breathing ?GI: soft, nontender, nondistended, + BS ?MS: no deformity or atrophy ?Skin: warm and dry, no rash ?Neuro:  Strength and sensation are intact ?Psych: euthymic mood, full affect ? ?Recent Labs: ?04/23/2021: TSH 3.25 ?01/19/2022: ALT 15; BUN 17; Creatinine, Ser 0.98; Hemoglobin 9.0; Platelets 204; Potassium 3.8; Sodium 135  ? ? ?Lipid Panel ?Lab Results  ?Component Value Date  ? CHOL 145 04/23/2021  ? HDL 62.50 04/23/2021  ? [place] 70 04/23/2021  ? TRIG 63.0 04/23/2021  ? ?  ? ?Wt Readings from Last 3 Encounters:  ?01/28/22 146 lb (66.2 kg)  ?01/19/22 144 lb (65.3 kg)  ?12/22/21 146 lb 3.2 oz (66.3 kg)  ?  ? ? ? ?ASSESSMENT AND PLAN: ? ?Problem List Items Addressed This Visit   ? ? MDS (myelodysplastic syndrome), low grade ([place])  ? Symptomatic anemia  ? ?Other Visit Diagnoses   ? ? Palpitations    -  Primary  ? Relevant Orders  ? EKG 12-Lead  ? LONG TERM MONITOR (3-14 DAYS)  ? ?  ? ?Palpitations ?Etiology unclear, [place] have presented in the setting of low hemoglobin ?Was also undergoing bone marrow biopsy ?This has improved somewhat since then, hemoglobin 9 and palpitations improving ?She is concerned about atrial fibrillation ?We will order Zio monitor for further evaluation ?Discussed long-term monitoring with cardia mobile device ?Also discussed potentially using beta-blockers as needed if symptoms persist ?No medication changes made on today's visit ? ?MDS ?Receiving therapy, hemoglobin stable at  9 ?Recent trend up from hemoglobin 8 ?Minimal lower extremity edema, denies significant shortness of breath on exertion, no anginal symptoms ? ? ? Total encounter time more than 45 minutes ? Greater than 50% was spent in counseling and coordination of care with the patient ? ? ? ?Signed, ?Esmond Plants, M.D., Ph.D. ?[facility] [place], [place] ?[phone] ?

## 2022-02-01 DIAGNOSIS — R002 Palpitations: Secondary | ICD-10-CM | POA: Diagnosis not present

## 2022-02-02 ENCOUNTER — Inpatient Hospital Stay: Payer: PPO

## 2022-02-02 ENCOUNTER — Inpatient Hospital Stay: Payer: PPO | Attending: Internal Medicine

## 2022-02-02 VITALS — BP 105/49 | HR 60

## 2022-02-02 DIAGNOSIS — D461 Refractory anemia with ring sideroblasts: Secondary | ICD-10-CM | POA: Insufficient documentation

## 2022-02-02 DIAGNOSIS — D46Z Other myelodysplastic syndromes: Secondary | ICD-10-CM

## 2022-02-02 LAB — HEMOGLOBIN AND HEMATOCRIT, BLOOD
HCT: 25.4 % — ABNORMAL LOW (ref 36.0–46.0)
Hemoglobin: 8.1 g/dL — ABNORMAL LOW (ref 12.0–15.0)

## 2022-02-02 MED ORDER — DARBEPOETIN ALFA 500 MCG/ML IJ SOSY
500.0000 ug | PREFILLED_SYRINGE | Freq: Once | INTRAMUSCULAR | Status: AC
  Administered 2022-02-02: 500 ug via SUBCUTANEOUS
  Filled 2022-02-02: qty 1

## 2022-02-16 ENCOUNTER — Inpatient Hospital Stay: Payer: PPO

## 2022-02-16 ENCOUNTER — Encounter: Payer: Self-pay | Admitting: Internal Medicine

## 2022-02-16 ENCOUNTER — Inpatient Hospital Stay (HOSPITAL_BASED_OUTPATIENT_CLINIC_OR_DEPARTMENT_OTHER): Payer: PPO | Admitting: Internal Medicine

## 2022-02-16 DIAGNOSIS — D46Z Other myelodysplastic syndromes: Secondary | ICD-10-CM

## 2022-02-16 DIAGNOSIS — D461 Refractory anemia with ring sideroblasts: Secondary | ICD-10-CM | POA: Diagnosis not present

## 2022-02-16 LAB — CBC WITH DIFFERENTIAL/PLATELET
Abs Immature Granulocytes: 0.01 10*3/uL (ref 0.00–0.07)
Basophils Absolute: 0.1 10*3/uL (ref 0.0–0.1)
Basophils Relative: 2 %
Eosinophils Absolute: 0.5 10*3/uL (ref 0.0–0.5)
Eosinophils Relative: 16 %
HCT: 25.2 % — ABNORMAL LOW (ref 36.0–46.0)
Hemoglobin: 8.3 g/dL — ABNORMAL LOW (ref 12.0–15.0)
Immature Granulocytes: 0 %
Lymphocytes Relative: 29 %
Lymphs Abs: 0.8 10*3/uL (ref 0.7–4.0)
MCH: 35.6 pg — ABNORMAL HIGH (ref 26.0–34.0)
MCHC: 32.9 g/dL (ref 30.0–36.0)
MCV: 108.2 fL — ABNORMAL HIGH (ref 80.0–100.0)
Monocytes Absolute: 0.3 10*3/uL (ref 0.1–1.0)
Monocytes Relative: 11 %
Neutro Abs: 1.2 10*3/uL — ABNORMAL LOW (ref 1.7–7.7)
Neutrophils Relative %: 42 %
Platelets: 150 10*3/uL (ref 150–400)
RBC: 2.33 MIL/uL — ABNORMAL LOW (ref 3.87–5.11)
RDW: 22.5 % — ABNORMAL HIGH (ref 11.5–15.5)
WBC: 2.8 10*3/uL — ABNORMAL LOW (ref 4.0–10.5)
nRBC: 1.1 % — ABNORMAL HIGH (ref 0.0–0.2)

## 2022-02-16 LAB — COMPREHENSIVE METABOLIC PANEL
ALT: 16 U/L (ref 0–44)
AST: 22 U/L (ref 15–41)
Albumin: 4.1 g/dL (ref 3.5–5.0)
Alkaline Phosphatase: 41 U/L (ref 38–126)
Anion gap: 3 — ABNORMAL LOW (ref 5–15)
BUN: 13 mg/dL (ref 8–23)
CO2: 25 mmol/L (ref 22–32)
Calcium: 8.2 mg/dL — ABNORMAL LOW (ref 8.9–10.3)
Chloride: 109 mmol/L (ref 98–111)
Creatinine, Ser: 0.98 mg/dL (ref 0.44–1.00)
GFR, Estimated: >60 mL/min (ref >60)
Glucose, Bld: 109 mg/dL — ABNORMAL HIGH (ref 70–99)
Potassium: 3.4 mmol/L — ABNORMAL LOW (ref 3.5–5.1)
Sodium: 137 mmol/L (ref 135–145)
Total Bilirubin: 1.5 mg/dL — ABNORMAL HIGH (ref 0.3–1.2)
Total Protein: 7.7 g/dL (ref 6.5–8.1)

## 2022-02-16 LAB — SAMPLE TO BLOOD BANK

## 2022-02-16 LAB — LACTATE DEHYDROGENASE: LDH: 92 U/L — ABNORMAL LOW (ref 98–192)

## 2022-02-16 MED ORDER — DARBEPOETIN ALFA 500 MCG/ML IJ SOSY
500.0000 ug | PREFILLED_SYRINGE | Freq: Once | INTRAMUSCULAR | Status: AC
  Administered 2022-02-16: 500 ug via SUBCUTANEOUS
  Filled 2022-02-16: qty 1

## 2022-02-16 NOTE — Assessment & Plan Note (Addendum)
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 . Currently on Aranesp 500 mcg; subcu injections; and  lenalidomide 10 mg  3w-ON & 1 w-OFF-tolerating fairly well except for mild GI effects/mild neutropenia. MARCH 2023-bone marrow biopsy no evidence of obvious progression of MDS or any acute process.  # Continue Aranesp-500 mcg every 2 weeks; along with Revlimid.  Today hemoglobin is 8.3.  Proceed with Aranesp. Continue revlimid 10 mg days 1-21 with 7 days off.  # Diarrhea/dyspepsis-G-1-2; sec to revlimid- monitor for now; discussed regarding use of cholestyramine- /decrease fat intake. controlled with immodium prn. STABLE;   # Palpitations: Normal rate and rhythm- s/p evaluation with Dr.Gollan s/p monitor.   # # Right shoulder pain/radiating to Right UE- [several months]; awaiting X-ray with ortho [Chris gaines]- continue ice/ heat for -STABLE; . Continue prn NSAIDs.   # DISPOSITION: # proceed with Aranesp today # in 2 weeks- H&H- possible aranesp # in 4 weeks- MD; labs- cbc/Cmp;LDH;hold tube; iron studies; ferritin; possible; Aranesp SQ- Dr.B

## 2022-02-16 NOTE — Progress Notes (Signed)
[facility] CONSULT NOTE  Patient Care Team: [name], [name], MD as PCP - General Rogue Bussing, [name], MD as Consulting Physician (Hematology and Oncology)  CHIEF COMPLAINTS/PURPOSE OF CONSULTATION: MDS   Oncology History Overview Note   # July 2020-myelodysplastic syndrome with ringed sideroblasts-Normal karyotype; no blasts [IPSS R-Very low risk ~median survival 8.3 years]; iron studies W11 folic acid myeloma panel normal; pyridoxine levels/copper/zinc-WNL. Erythropoietin levels-60. II OPINION at [facility] [Dr.DeCastro]  # [facility]/ NGS: TET2([id number])c.2524delT(p.Ser842GlnfsTer31) Exon 3 frame-shift SF3B1([id number])c.2098A>G(p.Lys700Glu) Exon 15 missense  DNMT3A([id number])c.2204A>G(p.Tyr735Cys) Exon 19 missense   # JAN 11th 2020- Aranesp/retacrit;  # July 30th, 2021- START REVLIMID 10 mg/day  [SF3B10mtation]  MARCH 2023- BONE MARROW, ASPIRATE, CLOT, CORE:  -  Hypercellular marrow involved by myelodysplastic syndrome with ring  sideroblasts (MDS-RS)  -  Polytypic plasmacytosis   # [facility] 2023-increase the frequency of Aranesp 400 mcg every 2 weeks; continue Revlimid.   #Mild hypothyroidism-on Synthroid; September 2021 ejection fraction 60 to 65%;   # colonoscopy- 2016 [Dr.Bucinni];  DIAGNOSIS: MDS/low-grade with ringed sideroblasts  STAGE: Low       ;  GOALS: Control  CURRENT/MOST RECENT THERAPY : EPO agent+ REV    [facility] ([facility]elodysplastic syndrome), low grade ([facility]  [place]/[place] Initial Diagnosis   MDS (myelodysplastic syndrome), low grade (HCC)     HISTORY OF PRESENTING ILLNESS: Alone.   Ambulating independently.  LMonetta LickMay 71 y.o.  female history of low-grade MDS anemia with ring sideroblasts currently on Aranesp/Revlimid is here for follow-up/.  Patient currently status post evaluation with cardiology for palpitations.;  Awaiting report from the monitor  Also complains of right shoulder/scapular pain-awaiting work-up with orthopedics.  Chronic mild  shortness of breath on exertion.  Otherwise denies no fever no chills.  No cough.  No nausea no vomiting. Chronic intermittent mild diarrhea.  Review of Systems  Constitutional:  Positive for malaise/fatigue. Negative for chills, diaphoresis and fever.  HENT:  Negative for nosebleeds and sore throat.   Eyes:  Negative for double vision.  Respiratory:  Negative for cough, hemoptysis, sputum production, shortness of breath and wheezing.   Cardiovascular:  Negative for chest pain and orthopnea.  Gastrointestinal:  Negative for abdominal pain, blood in stool, constipation, diarrhea, heartburn, melena and vomiting.  Genitourinary:  Negative for dysuria, frequency and urgency.  Musculoskeletal:  Positive for back pain and joint pain.  Skin: Negative.  Negative for itching and rash.  Neurological:  Negative for dizziness, tingling, focal weakness, weakness and headaches.  Endo/Heme/Allergies:  Does not bruise/bleed easily.  Psychiatric/Behavioral:  Negative for depression. The patient is not nervous/anxious and does not have insomnia.    MEDICAL HISTORY:  Past Medical History:  Diagnosis Date   Allergic rhinitis, cause unspecified    Anemia, unspecified    Carpal tunnel syndrome    Cystitis, unspecified    Family history of osteoporosis    PONV (postoperative nausea and vomiting)    Postnasal drip    Syncope and collapse     SURGICAL HISTORY: Past Surgical History:  Procedure Laterality Date   BONE MARROW BIOPSY     CARPAL TUNNEL RELEASE     COLONOSCOPY  09/28/2003   COLONOSCOPY WITH PROPOFOL N/A 07/31/2015   Procedure: COLONOSCOPY WITH PROPOFOL;  Surgeon: RRonald Lobo MD;  [facility];  Service: Endoscopy;  Laterality: N/A;   DIAGNOSTIC LAPAROSCOPY     dx lack of pregnancy    EYE SURGERY     lasik, cataract, prk,yag procedure    SOCIAL HISTORY: Social History  Socioeconomic History   Marital status: Married    Spouse name: Not on file   Number of children:  Not on file   Years of education: Not on file   Highest education level: Not on file  Occupational History   Not on file  Tobacco Use   Smoking status: Never   Smokeless tobacco: Never  Vaping Use   Vaping Use: Never used  Substance and Sexual Activity   Alcohol use: Yes    Alcohol/week: 0.0 standard drinks    Comment: Rare   Drug use: No   Sexual activity: Not Currently  Other Topics Concern   Not on file  Social History Narrative   No regular exercise      Drinks lots of [employer]         Social Determinants of Health   Financial Resource Strain: Not on file  Food Insecurity: Not on file  Transportation Needs: Not on file  Physical Activity: Not on file  Stress: Not on file  Social Connections: Not on file  Intimate Partner Violence: Not on file    FAMILY HISTORY: Family History  Problem Relation Age of Onset   Osteoporosis Mother    Stroke Mother    Coronary artery disease Father    Stroke Father 78   Diabetes Other        Aunts and uncles   Breast cancer Paternal Aunt    Breast cancer Maternal Aunt    Arthritis/Rheumatoid Child    Breast cancer Cousin     ALLERGIES:  is allergic to codeine.  MEDICATIONS:  Current Outpatient Medications  Medication Sig Dispense Refill   acetaminophen (TYLENOL) 500 MG tablet Take 500-1,000 mg by mouth every 6 (six) hours as needed for mild pain or headache.     aspirin EC 81 MG tablet Take 81 mg by mouth at bedtime. Swallow whole.      Biotin 10 MG CAPS Take by mouth.     Calcium Carb-Cholecalciferol (CALCIUM-VITAMIN D3) 600-10 MG-MCG CAPS Take by mouth. Taking twice dailly     Darbepoetin Alfa (ARANESP) 500 MCG/ML SOSY injection Inject 500 mcg into the skin See admin instructions. Inject 500 mcg into the skin every week     lenalidomide (REVLIMID) 10 MG capsule Take 1 capsule (10 mg total) by mouth daily. Take for 21 days, then hold for 7 days. Repeat every 28 days. 21 capsule 0   levothyroxine (SYNTHROID) 25 MCG tablet  TAKE 1 TABLET BY MOUTH ONCE A DAY BEFOREBREAKFAST. 90 tablet 1   ondansetron (ZOFRAN ODT) 8 MG disintegrating tablet Take 1 tablet (8 mg total) by mouth every 8 (eight) hours as needed for nausea or vomiting. 30 tablet 0   No current facility-administered medications for this visit.      PHYSICAL EXAMINATION:   Vitals:   02/16/22 0918  BP: (!) 106/37  Pulse: (!) 58  Temp: 97.8 F (36.6 C)  SpO2: 100%     Filed Weights   02/16/22 0918  Weight: 144 lb 3.2 oz (65.4 kg)      Physical Exam HENT:     Head: Normocephalic and atraumatic.     Mouth/Throat:     Pharynx: No oropharyngeal exudate.  Eyes:     Pupils: Pupils are equal, round, and reactive to light.  Cardiovascular:     Rate and Rhythm: Normal rate and regular rhythm.     Pulses: Normal pulses.     Heart sounds: Normal heart sounds.  Pulmonary:  Effort: Pulmonary effort is normal. No respiratory distress.     Breath sounds: Normal breath sounds. No wheezing.  Abdominal:     General: Bowel sounds are normal. There is no distension.     Palpations: Abdomen is soft. There is no mass.     Tenderness: There is no abdominal tenderness. There is no guarding or rebound.  Musculoskeletal:        General: No tenderness. Normal range of motion.     Cervical back: Normal range of motion and neck supple.  Skin:    General: Skin is warm.  Neurological:     Mental Status: She is alert and oriented to person, place, and time.  Psychiatric:        Mood and Affect: Affect normal.    LABORATORY DATA:  I have reviewed the data as listed Lab Results  Component Value Date   WBC 2.8 (L) 02/16/2022   HGB 8.3 (L) 02/16/2022   HCT 25.2 (L) 02/16/2022   MCV 108.2 (H) 02/16/2022   PLT 150 02/16/2022   Recent Labs    12/22/21 0908 01/19/22 1041 02/16/22 0911  NA 137 135 137  K 3.3* 3.8 3.4*  CL 105 104 109  CO2 _0 GLUCOSE 121* 115* 109*  BUN _1 CREATININE 0.90 0.98 0.98  CALCIUM 8.5* 8.7* 8.2*   GFRNONAA >60 >60 >60  PROT 7.8 8.0 7.7  ALBUMIN 4.1 4.3 4.1  AST _2 ALT _3 ALKPHOS 42 41 41  BILITOT 1.3* 1.2 1.5*     No results found.  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 POSITIVE  For SF3B1; R-IPSS-low risk. . Currently on Aranesp 500 mcg; subcu injections; and  lenalidomide 10 mg  3w-ON & 1 w-OFF-tolerating fairly well except for mild GI effects/mild neutropenia. MARCH 2023-bone marrow biopsy no evidence of obvious progression of MDS or any acute process.  # Continue Aranesp-500 mcg every 2 weeks; along with Revlimid.  Today hemoglobin is 8.3.  Proceed with Aranesp. Continue revlimid 10 mg days 1-21 with 7 days off.  # Diarrhea/dyspepsis-G-1-2; sec to revlimid- monitor for now; discussed regarding use of cholestyramine- /decrease fat intake. controlled with immodium prn. STABLE;   # Palpitations: Normal rate and rhythm- s/p evaluation with Dr.Gollan s/p monitor.   # # Right shoulder pain/radiating to Right UE- [several months]; awaiting X-ray with ortho [Chris gaines]- continue ice/ heat for -STABLE; . Continue prn NSAIDs.   # DISPOSITION: # proceed with Aranesp today # in 2 weeks- H&H- possible aranesp # in 4 weeks- MD; labs- cbc/Cmp;LDH;hold tube; iron studies; ferritin; possible; Aranesp SQ- Dr.B  All questions were answered. The patient knows to call the clinic with any problems, questions or concerns.   Cammie Sickle, MD 02/16/2022 11:30 AM

## 2022-02-17 DIAGNOSIS — M19011 Primary osteoarthritis, right shoulder: Secondary | ICD-10-CM | POA: Diagnosis not present

## 2022-02-17 DIAGNOSIS — M75101 Unspecified rotator cuff tear or rupture of right shoulder, not specified as traumatic: Secondary | ICD-10-CM | POA: Diagnosis not present

## 2022-02-17 DIAGNOSIS — M25511 Pain in right shoulder: Secondary | ICD-10-CM | POA: Diagnosis not present

## 2022-02-18 ENCOUNTER — Telehealth: Payer: Self-pay | Admitting: Internal Medicine

## 2022-02-18 NOTE — Telephone Encounter (Signed)
Spoke to Dr.Maklouf's office recommend ANC 1.5 or above prior to dental cleaning. Ok with peridex pre- and post cleaning. Last ANC on 5/23- was 1.2.   Thanks GB  FYI-

## 2022-02-19 DIAGNOSIS — R002 Palpitations: Secondary | ICD-10-CM | POA: Diagnosis not present

## 2022-02-24 ENCOUNTER — Other Ambulatory Visit: Payer: Self-pay | Admitting: *Deleted

## 2022-02-24 DIAGNOSIS — D46Z Other myelodysplastic syndromes: Secondary | ICD-10-CM

## 2022-02-24 MED ORDER — LENALIDOMIDE 10 MG PO CAPS: 10.0000 mg | ORAL_CAPSULE | Freq: Every day | ORAL | 0 refills | Status: DC

## 2022-02-24 NOTE — Telephone Encounter (Signed)
Your patient is required to take a survey for this authorization number to be valid. Your prescription authorization number is [id number]. This number is only valid for 30 days.

## 2022-02-25 ENCOUNTER — Telehealth: Payer: Self-pay

## 2022-02-25 NOTE — Telephone Encounter (Signed)
-----   Message from Minna Merritts, MD sent at 02/25/2022 12:24 PM EDT ----- Event monitor Normal sinus No significant arrhythmia noted, rare extra beats Unless having symptoms, no new medications needed

## 2022-02-25 NOTE — Telephone Encounter (Signed)
Reviewed results w/ pt.   She verbalizes understanding and will call back w/ any questions or concerns.

## 2022-03-02 ENCOUNTER — Inpatient Hospital Stay: Payer: PPO | Attending: Internal Medicine

## 2022-03-02 ENCOUNTER — Inpatient Hospital Stay: Payer: PPO

## 2022-03-02 VITALS — BP 125/51 | HR 55

## 2022-03-02 DIAGNOSIS — D696 Thrombocytopenia, unspecified: Secondary | ICD-10-CM | POA: Diagnosis not present

## 2022-03-02 DIAGNOSIS — R197 Diarrhea, unspecified: Secondary | ICD-10-CM | POA: Insufficient documentation

## 2022-03-02 DIAGNOSIS — D709 Neutropenia, unspecified: Secondary | ICD-10-CM | POA: Diagnosis not present

## 2022-03-02 DIAGNOSIS — Z803 Family history of malignant neoplasm of breast: Secondary | ICD-10-CM | POA: Diagnosis not present

## 2022-03-02 DIAGNOSIS — R002 Palpitations: Secondary | ICD-10-CM | POA: Insufficient documentation

## 2022-03-02 DIAGNOSIS — D46Z Other myelodysplastic syndromes: Secondary | ICD-10-CM

## 2022-03-02 DIAGNOSIS — Z7961 Long term (current) use of immunomodulator: Secondary | ICD-10-CM | POA: Insufficient documentation

## 2022-03-02 DIAGNOSIS — M25511 Pain in right shoulder: Secondary | ICD-10-CM | POA: Diagnosis not present

## 2022-03-02 DIAGNOSIS — D461 Refractory anemia with ring sideroblasts: Secondary | ICD-10-CM | POA: Insufficient documentation

## 2022-03-02 DIAGNOSIS — E039 Hypothyroidism, unspecified: Secondary | ICD-10-CM | POA: Diagnosis not present

## 2022-03-02 LAB — HEMOGLOBIN: Hemoglobin: 8.2 g/dL — ABNORMAL LOW (ref 12.0–15.0)

## 2022-03-02 LAB — HEMATOCRIT: HCT: 25 % — ABNORMAL LOW (ref 36.0–46.0)

## 2022-03-02 MED ORDER — DARBEPOETIN ALFA 500 MCG/ML IJ SOSY
500.0000 ug | PREFILLED_SYRINGE | Freq: Once | INTRAMUSCULAR | Status: AC
  Administered 2022-03-02: 500 ug via SUBCUTANEOUS
  Filled 2022-03-02: qty 1

## 2022-03-03 ENCOUNTER — Other Ambulatory Visit: Payer: Self-pay | Admitting: Orthopedic Surgery

## 2022-03-03 DIAGNOSIS — M75101 Unspecified rotator cuff tear or rupture of right shoulder, not specified as traumatic: Secondary | ICD-10-CM

## 2022-03-12 ENCOUNTER — Encounter: Admit: 2022-03-12 | Discharge: 2022-03-12 | Payer: MEDICARE

## 2022-03-12 DIAGNOSIS — M1990 Unspecified osteoarthritis, unspecified site: Secondary | ICD-10-CM

## 2022-03-12 DIAGNOSIS — I10 Essential (primary) hypertension: Secondary | ICD-10-CM

## 2022-03-12 DIAGNOSIS — C4362 Malignant melanoma of left upper limb, including shoulder: Secondary | ICD-10-CM

## 2022-03-12 DIAGNOSIS — C439 Malignant melanoma of skin, unspecified: Secondary | ICD-10-CM

## 2022-03-12 DIAGNOSIS — E559 Vitamin D deficiency, unspecified: Secondary | ICD-10-CM

## 2022-03-12 DIAGNOSIS — E669 Obesity, unspecified: Secondary | ICD-10-CM

## 2022-03-12 DIAGNOSIS — R002 Palpitations: Secondary | ICD-10-CM

## 2022-03-12 DIAGNOSIS — I471 Supraventricular tachycardia: Secondary | ICD-10-CM

## 2022-03-12 DIAGNOSIS — H3530 Unspecified macular degeneration: Secondary | ICD-10-CM

## 2022-03-12 DIAGNOSIS — C50919 Malignant neoplasm of unspecified site of unspecified female breast: Secondary | ICD-10-CM

## 2022-03-12 LAB — COMPREHENSIVE METABOLIC PANEL
ALBUMIN: 4.1 g/dL (ref 3.5–5.0)
ALK PHOSPHATASE: 89 U/L (ref 25–110)
ALT: 35 U/L (ref 7–56)
ANION GAP: 8 K/UL (ref 3–12)
AST: 32 U/L (ref 7–40)
BLD UREA NITROGEN: 12 mg/dL (ref 7–25)
CALCIUM: 9.3 mg/dL (ref 8.5–10.6)
CHLORIDE: 108 MMOL/L (ref 98–110)
CO2: 26 MMOL/L (ref 21–30)
CREATININE: 0.6 mg/dL (ref 0.4–1.00)
EGFR: >60 mL/min (ref >60)
GLUCOSE,PANEL: 92 mg/dL (ref 70–100)
SODIUM: 142 MMOL/L — ABNORMAL HIGH (ref 137–147)
TOTAL BILIRUBIN: 0.6 mg/dL (ref 0.3–1.2)
TOTAL PROTEIN: 6.9 g/dL (ref 6.0–8.0)

## 2022-03-12 LAB — 25-OH VITAMIN D (D2 + D3): VITAMIN D (25-OH) TOTAL: 28 ng/mL — ABNORMAL LOW (ref 30–80)

## 2022-03-12 LAB — CBC AND DIFF
ABSOLUTE BASO COUNT: 0.1 K/UL (ref 0–0.20)
ABSOLUTE EOS COUNT: 0.2 K/UL (ref 0–0.45)
ABSOLUTE MONO COUNT: 0.6 K/UL (ref 0–0.80)
WBC COUNT: 6.7 K/UL (ref 4.5–11.0)

## 2022-03-12 NOTE — Progress Notes
Name: Kaitlyn Friedman          MRN: [id number]      DOB: [birth year]      AGE: 71 y.o.   DATE OF SERVICE: 03/12/2022    Subjective:             Reason for Visit:  Follow Up      Kaitlyn Friedman is a 71 y.o. female.      Cancer Staging   Malignant neoplasm of upper-outer quadrant of left female breast [facility])  Staging form: Breast, AJCC 7th Edition  - Pathologic: Stage IA (T1, N0, cM0) - Signed by Cammy Copa, [name] on 03/28/2015    Melanoma [facility])  Staging form: Melanoma of the Skin, AJCC 7th Edition  - Pathologic: Stage IB (T2a, N0, cM0) - Signed by Sherril Croon, [name] on 02/21/2015      History of Present Illness    Ms. Kaitlyn Friedman presents today for follow up of her history of malignant melanoma.   She is here for melanoma surveillance.   She has had more arthralgias.   She has not seen an orthopedist for this.   She denies new skin changes.          Review of Systems   Constitutional: Negative for activity change, appetite change, chills, fatigue and fever.   HENT: Negative for mouth sores.    Respiratory: Negative for shortness of breath.    Cardiovascular: Negative for chest pain and leg swelling.   Gastrointestinal: Negative for diarrhea, nausea and vomiting.   Musculoskeletal: Positive for arthralgias. Negative for myalgias.   Skin: Negative for rash.   Neurological: Negative for seizures, weakness, light-headedness and headaches.   Psychiatric/Behavioral: The patient is not nervous/anxious.          Objective:         ? Cholecalciferol (Vitamin D3) 2,000 unit cap take by mouth daily   ? cyclobenzaprine (FLEXERIL) 10 mg tablet    ? ibuprofen (MOTRIN) 800 mg tablet Take 1 tablet by mouth every 6 hours as needed for Pain. Take with food.   ? lisinopril (PRINIVIL, ZESTRIL) 40 mg tablet Take one tablet by mouth at bedtime daily.   ? metoprolol XL (TOPROL XL) 50 mg tablet Take one tablet by mouth at bedtime daily.   ? MULTIVITAMIN PO Take 1 tablet by mouth daily.   ? nitrofurantoin monohyd/m-cryst (MACROBID) 100 mg capsule Take one capsule by mouth every 12 hours. Take with food.   ? oxybutynin XL (DITROPAN XL) 5 mg tablet Take one tablet by mouth daily.   ? pregabalin (LYRICA) 25 mg capsule Take one capsule by mouth three times daily.   ? Vitamin A-Vitamin C-Vit E-Min (PRESERVISION AREDS) cap Take 1 capsule by mouth daily.     Vitals:    03/12/22 0913   BP: (!) 155/94   BP Source: Arm, Right Lower   Pulse: 59   Temp: 37 ?C (98.6 ?F)   Resp: 16   SpO2: 98%   O2 Device: None (Room air)   TempSrc: Temporal   PainSc: Zero   Weight: 114.9 kg (253 lb 6.4 oz)   Height: 154.9 cm (5' 0.98)     Body mass index is 47.9 kg/m?Marland Kitchen     Pain Score: Zero            Pain Addressed:  N/A    Patient Evaluated for a Clinical Trial: Patient not eligible for a treatment trial (including not needing treatment, needs palliative care, in remission).  [place] Cooperative Oncology Group performance status is 0, Fully active, able to carry on all pre-disease performance without restriction.Marland Kitchen     Physical Exam  Constitutional:       General: She is not in acute distress.     Appearance: She is well-developed. She is obese.   HENT:      Head: Normocephalic.   Eyes:      Conjunctiva/sclera: Conjunctivae normal.      Pupils: Pupils are equal, round, and reactive to light.   Cardiovascular:      Rate and Rhythm: Normal rate and regular rhythm.      Heart sounds: Normal heart sounds.   Pulmonary:      Effort: Pulmonary effort is normal.      Breath sounds: Normal breath sounds.   Abdominal:      General: Bowel sounds are normal.      Palpations: Abdomen is soft. There is no mass.      Tenderness: There is no abdominal tenderness. There is no guarding.   Lymphadenopathy:      Cervical: No cervical adenopathy.   Skin:     General: Skin is warm and dry.      Findings: No rash.   Neurological:      Mental Status: She is alert and oriented to person, place, and time.      Cranial Nerves: No cranial nerve deficit.   Psychiatric:         Behavior: Behavior normal.         Thought Content: Thought content normal.         Judgment: Judgment normal.          CBC w/Diff    Lab Results   Component Value Date/Time    WBC 6.7 03/12/2022 08:41 AM    RBC 5.07 (H) 03/12/2022 08:41 AM    HGB 14.4 03/12/2022 08:41 AM    HCT 42.9 03/12/2022 08:41 AM    MCV 84.6 03/12/2022 08:41 AM    MCH 28.5 03/12/2022 08:41 AM    MCHC 33.7 03/12/2022 08:41 AM    RDW 14.3 03/12/2022 08:41 AM    PLTCT 219 03/12/2022 08:41 AM    MPV 8.5 03/12/2022 08:41 AM    Lab Results   Component Value Date/Time    NEUT 58 03/12/2022 08:41 AM    ANC 3.90 03/12/2022 08:41 AM    LYMA 31 03/12/2022 08:41 AM    ALC 2.10 03/12/2022 08:41 AM    MONA 8 03/12/2022 08:41 AM    AMC 0.60 03/12/2022 08:41 AM    EOSA 2 03/12/2022 08:41 AM    AEC 0.20 03/12/2022 08:41 AM    BASA 1 03/12/2022 08:41 AM    ABC 0.10 03/12/2022 08:41 AM      Comprehensive Metabolic Profile    Lab Results   Component Value Date/Time    NA 142 03/12/2022 08:41 AM    K 3.9 03/12/2022 08:41 AM    CL 108 03/12/2022 08:41 AM    CO2 26 03/12/2022 08:41 AM    GAP 8 03/12/2022 08:41 AM    BUN 12 03/12/2022 08:41 AM    CR 0.68 03/12/2022 08:41 AM    GLU 92 03/12/2022 08:41 AM    Lab Results   Component Value Date/Time    CA 9.3 03/12/2022 08:41 AM    ALBUMIN 4.1 03/12/2022 08:41 AM    TOTPROT 6.9 03/12/2022 08:41 AM    ALKPHOS 89 03/12/2022 08:41 AM    AST 32 03/12/2022  08:41 AM    ALT 35 03/12/2022 08:41 AM    TOTBILI 0.6 03/12/2022 08:41 AM    GFR >60 01/02/2015 12:10 PM    GFRAA >60 01/02/2015 12:10 PM             Assessment and Plan:    Problem   Melanoma (Hcc)    In 2016, patient was referred to Dr Frederik Pear for excision of lesion on her left arm which was very large and dark.  Patient didn't know how long it was there as the location was hard for her to see.  She noticed it a few months before but didn't think much about until it started getting larger and changing.  Now is newly diagnosed with malignant melanoma after biopsy taken, Breslow thickness 2.1 mm, non-ulcerated, margins not involved. She then underwent a WLE with SLN Biopsy on 02/07/15 which found no residual melanoma and was negative for mets in all 8 LNs tested. She has since followed with Dr. Wallie Char for routine restaging with chest x-rays every 6 months. The last of which, taken 09/18/19, found no abnormalities.  She has a hx of breast cancer with prior lumpectomy performed in 2013 by Dr Cathrine Muster.  She also reports having a history of double mastectomy with clear SLNs.  TIMELINE:  12/24/14  Path  Skin, left arm --Invasive Nodular melanoma.  Tumor thickness 2.1 mm.  Anatomic level IV.  Ulceration not identified. Margins uninvolved. Closest margin 0.7 mm.  --Mitotic rate greater than 1/mm2: 6/mm2.  Microsatellitosis not identified. Lymphovascular invasion and perineural invasion not identified.  --Tumor infiltrating lymphocytes present, non brisk.  Tumor regression not identified   WLE with SLN Bx 02/07/15:   A. left axillary sentinel lymph node #1: ?   -- Negative for melanoma (0/2).     B. left axillary sentinel lymph node #2: ?   -- Negative for melanoma (0/1).     C. left axillary sentinel lymph node #3: ?   -- Negative for melanoma (0/5).     D. left arm:   -- Postoperative scar.   -- Negative for residual melanoma.     CHEST 2 VIEWS 09/18/19:   Stable chest radiograph demonstrating no acute cardiopulmonary   abnormalities.     CONSENTED TO SURVIVORSHIP REGISTRY: 01/02/20         Melanoma (HCC)  No evidence of disease by history or on exam   Continue surveillance      PLAN  -Continue surveillance  -RTC in 1 year, CBC, CMP, Vit D prior  -Continue vitamin d daily  -Encouraged F/U in the interim as needed. Patient verbalized understanding and agrees with plan as outlined above      Twanna Hy, APRN    My supervising physician for this visit is Dr. Adora Fridge.

## 2022-03-12 NOTE — Assessment & Plan Note
No evidence of disease by history or on exam   Continue surveillance

## 2022-03-15 ENCOUNTER — Ambulatory Visit
Admission: RE | Admit: 2022-03-15 | Discharge: 2022-03-15 | Disposition: A | Discharge status: Home / Self Care | Payer: PPO | Source: Ambulatory Visit | Attending: Orthopedic Surgery | Admitting: Orthopedic Surgery

## 2022-03-15 DIAGNOSIS — M75101 Unspecified rotator cuff tear or rupture of right shoulder, not specified as traumatic: Secondary | ICD-10-CM

## 2022-03-15 DIAGNOSIS — M25511 Pain in right shoulder: Secondary | ICD-10-CM | POA: Diagnosis not present

## 2022-03-16 ENCOUNTER — Encounter: Payer: Self-pay | Admitting: Internal Medicine

## 2022-03-16 ENCOUNTER — Inpatient Hospital Stay: Payer: PPO

## 2022-03-16 ENCOUNTER — Inpatient Hospital Stay (HOSPITAL_BASED_OUTPATIENT_CLINIC_OR_DEPARTMENT_OTHER): Payer: PPO | Admitting: Internal Medicine

## 2022-03-16 DIAGNOSIS — D46Z Other myelodysplastic syndromes: Secondary | ICD-10-CM

## 2022-03-16 DIAGNOSIS — D461 Refractory anemia with ring sideroblasts: Secondary | ICD-10-CM | POA: Diagnosis not present

## 2022-03-16 LAB — CBC WITH DIFFERENTIAL/PLATELET
Abs Immature Granulocytes: 0.03 10*3/uL (ref 0.00–0.07)
Basophils Absolute: 0 10*3/uL (ref 0.0–0.1)
Basophils Relative: 2 %
Eosinophils Absolute: 0.3 10*3/uL (ref 0.0–0.5)
Eosinophils Relative: 12 %
HCT: 25.2 % — ABNORMAL LOW (ref 36.0–46.0)
Hemoglobin: 8.1 g/dL — ABNORMAL LOW (ref 12.0–15.0)
Immature Granulocytes: 1 %
Lymphocytes Relative: 32 %
Lymphs Abs: 0.9 10*3/uL (ref 0.7–4.0)
MCH: 36.3 pg — ABNORMAL HIGH (ref 26.0–34.0)
MCHC: 32.1 g/dL (ref 30.0–36.0)
MCV: 113 fL — ABNORMAL HIGH (ref 80.0–100.0)
Monocytes Absolute: 0.3 10*3/uL (ref 0.1–1.0)
Monocytes Relative: 11 %
Neutro Abs: 1.1 10*3/uL — ABNORMAL LOW (ref 1.7–7.7)
Neutrophils Relative %: 42 %
Platelets: 146 10*3/uL — ABNORMAL LOW (ref 150–400)
RBC: 2.23 MIL/uL — ABNORMAL LOW (ref 3.87–5.11)
RDW: 22.4 % — ABNORMAL HIGH (ref 11.5–15.5)
WBC: 2.6 10*3/uL — ABNORMAL LOW (ref 4.0–10.5)
nRBC: 1.1 % — ABNORMAL HIGH (ref 0.0–0.2)

## 2022-03-16 LAB — COMPREHENSIVE METABOLIC PANEL
ALT: 13 U/L (ref 0–44)
AST: 17 U/L (ref 15–41)
Albumin: 4.2 g/dL (ref 3.5–5.0)
Alkaline Phosphatase: 42 U/L (ref 38–126)
Anion gap: 6 (ref 5–15)
BUN: 14 mg/dL (ref 8–23)
CO2: 27 mmol/L (ref 22–32)
Calcium: 8.6 mg/dL — ABNORMAL LOW (ref 8.9–10.3)
Chloride: 106 mmol/L (ref 98–111)
Creatinine, Ser: 1.02 mg/dL — ABNORMAL HIGH (ref 0.44–1.00)
GFR, Estimated: 59 mL/min — ABNORMAL LOW (ref >60)
Glucose, Bld: 98 mg/dL (ref 70–99)
Potassium: 3.8 mmol/L (ref 3.5–5.1)
Sodium: 139 mmol/L (ref 135–145)
Total Bilirubin: 1.5 mg/dL — ABNORMAL HIGH (ref 0.3–1.2)
Total Protein: 7.4 g/dL (ref 6.5–8.1)

## 2022-03-16 LAB — LACTATE DEHYDROGENASE: LDH: 92 U/L — ABNORMAL LOW (ref 98–192)

## 2022-03-16 LAB — SAMPLE TO BLOOD BANK

## 2022-03-16 LAB — IRON AND TIBC
Iron: 258 ug/dL — ABNORMAL HIGH (ref 28–170)
Saturation Ratios: 89 % — ABNORMAL HIGH (ref 10.4–31.8)
TIBC: 291 ug/dL (ref 250–450)
UIBC: 33 ug/dL

## 2022-03-16 LAB — FERRITIN: Ferritin: 241 ng/mL (ref 11–307)

## 2022-03-16 MED ORDER — DARBEPOETIN ALFA 500 MCG/ML IJ SOSY
500.0000 ug | PREFILLED_SYRINGE | Freq: Once | INTRAMUSCULAR | Status: AC
  Administered 2022-03-16: 500 ug via SUBCUTANEOUS
  Filled 2022-03-16: qty 1

## 2022-03-16 NOTE — Progress Notes (Signed)
[facility] CONSULT NOTE  Patient Care Team: [name], [name], MD as PCP - General Rogue Bussing, [name], MD as Consulting Physician (Hematology and Oncology)  CHIEF COMPLAINTS/PURPOSE OF CONSULTATION: MDS   Oncology History Overview Note   # July 2020-myelodysplastic syndrome with ringed sideroblasts-Normal karyotype; no blasts [IPSS R-Very low risk ~median survival 8.3 years]; iron studies A63 folic acid myeloma panel normal; pyridoxine levels/copper/zinc-WNL. Erythropoietin levels-60. II OPINION at [place] [Dr.DeCastro]  # [facility]/ NGS: TET2([id number])c.2524delT(p.Ser842GlnfsTer31) Exon 3 frame-shift SF3B1([id number])c.2098A>G(p.Lys700Glu) Exon 15 missense  DNMT3A([id number])c.2204A>G(p.Tyr735Cys) Exon 19 missense   # JAN 11th 2020- Aranesp/retacrit;  # July 30th, 2021- START REVLIMID 10 mg/day  [SF3B90mtation]  MARCH 2023- BONE MARROW, ASPIRATE, CLOT, CORE:  -  Hypercellular marrow involved by myelodysplastic syndrome with ring  sideroblasts (MDS-RS)  -  Polytypic plasmacytosis   # [facility] 2023-increase the frequency of Aranesp 400 mcg every 2 weeks; continue Revlimid.   #Mild hypothyroidism-on Synthroid; September 2021 ejection fraction 60 to 65%;   # colonoscopy- 2016 [Dr.Bucinni];  DIAGNOSIS: MDS/low-grade with ringed sideroblasts  STAGE: Low       ;  GOALS: Control  CURRENT/MOST RECENT THERAPY : EPO agent+ REV    MDS (myelodysplastic syndrome), low grade ([facility]  04/23/2019 Initial Diagnosis   MDS (myelodysplastic syndrome), low grade ([facility])     HISTORY OF PRESENTING ILLNESS: Alone.   Ambulating independently.  LMohini HeathcockMay 71 y.o.  female history of low-grade MDS anemia with ring sideroblasts currently on Aranesp/Revlimid is here for follow-up.  Patient did not have her dental cleaning done because of low blood counts last month.  In the interim patient underwent injection steroid of in the right shoulder.  No significant benefit noted.  Awaiting MRI of the  shoulder.  Chronic mild shortness of breath on exertion.  Otherwise denies no fever no chills.  No cough.  No nausea no vomiting. Chronic intermittent mild diarrhea.  Review of Systems  Constitutional:  Positive for malaise/fatigue. Negative for chills, diaphoresis and fever.  HENT:  Negative for nosebleeds and sore throat.   Eyes:  Negative for double vision.  Respiratory:  Negative for cough, hemoptysis, sputum production, shortness of breath and wheezing.   Cardiovascular:  Negative for chest pain and orthopnea.  Gastrointestinal:  Negative for abdominal pain, blood in stool, constipation, diarrhea, heartburn, melena and vomiting.  Genitourinary:  Negative for dysuria, frequency and urgency.  Musculoskeletal:  Positive for back pain and joint pain.  Skin: Negative.  Negative for itching and rash.  Neurological:  Negative for dizziness, tingling, focal weakness, weakness and headaches.  Endo/Heme/Allergies:  Does not bruise/bleed easily.  Psychiatric/Behavioral:  Negative for depression. The patient is not nervous/anxious and does not have insomnia.     MEDICAL HISTORY:  Past Medical History:  Diagnosis Date  . Allergic rhinitis, cause unspecified   . Anemia, unspecified   . Carpal tunnel syndrome   . Cystitis, unspecified   . Family history of osteoporosis   . PONV (postoperative nausea and vomiting)   . Postnasal drip   . Syncope and collapse     SURGICAL HISTORY: Past Surgical History:  Procedure Laterality Date  . BONE MARROW BIOPSY    . CARPAL TUNNEL RELEASE    . COLONOSCOPY  09/28/2003  . COLONOSCOPY WITH PROPOFOL N/A 07/31/2015   Procedure: COLONOSCOPY WITH PROPOFOL;  Surgeon: RRonald Lobo MD;  Location: [facility];  Service: Endoscopy;  Laterality: N/A;  . DIAGNOSTIC LAPAROSCOPY     dx lack of pregnancy   . EYE SURGERY  lasik, cataract, prk,yag procedure    SOCIAL HISTORY: Social History   Socioeconomic History  . Marital status: Married     Spouse name: Not on file  . Number of children: Not on file  . Years of education: Not on file  . Highest education level: Not on file  Occupational History  . Not on file  Tobacco Use  . Smoking status: Never  . Smokeless tobacco: Never  Vaping Use  . Vaping Use: Never used  Substance and Sexual Activity  . Alcohol use: Yes    Alcohol/week: 0.0 standard drinks of alcohol    Comment: Rare  . Drug use: No  . Sexual activity: Not Currently  Other Topics Concern  . Not on file  Social History Narrative   No regular exercise      Drinks lots of [employer]         Social Determinants of Health   Financial Resource Strain: Low Risk  (03/06/2020)   Overall Financial Resource Strain (CARDIA)   . Difficulty of Paying Living Expenses: Not hard at all  Food Insecurity: No Food Insecurity (03/06/2020)   Hunger Vital Sign   . Worried About Charity fundraiser in the Last Year: Never true   . Ran Out of Food in the Last Year: Never true  Transportation Needs: No Transportation Needs (03/06/2020)   PRAPARE - Transportation   . Lack of Transportation (Medical): No   . Lack of Transportation (Non-Medical): No  Physical Activity: Inactive (03/06/2020)   Exercise Vital Sign   . Days of Exercise per Week: 0 days   . Minutes of Exercise per Session: 0 min  Stress: No Stress Concern Present (03/06/2020)   [place]   . Feeling of Stress : Not at all  Social Connections: Not on file  Intimate Partner Violence: Not At Risk (03/06/2020)   Humiliation, Afraid, Rape, and Kick questionnaire   . Fear of Current or Ex-Partner: No   . Emotionally Abused: No   . Physically Abused: No   . Sexually Abused: No    FAMILY HISTORY: Family History  Problem Relation Age of Onset  . Osteoporosis Mother   . Stroke Mother   . Coronary artery disease Father   . Stroke Father 32  . Diabetes Other        Aunts and uncles  . Breast cancer  Paternal Aunt   . Breast cancer Maternal Aunt   . Arthritis/Rheumatoid Child   . Breast cancer Cousin     ALLERGIES:  is allergic to codeine.  MEDICATIONS:  Current Outpatient Medications  Medication Sig Dispense Refill  . acetaminophen (TYLENOL) 500 MG tablet Take 500-1,000 mg by mouth every 6 (six) hours as needed for mild pain or headache.    Mar[place] Kitchen aspirin EC 81 MG tablet Take 81 mg by mouth at bedtime. Swallow whole.     . Biotin 10 MG CAPS Take by mouth.    . Calcium Carb-Cholecalciferol (CALCIUM-VITAMIN D3) 600-10 MG-MCG CAPS Take by mouth. Taking twice dailly    . Darbepoetin Alfa (ARANESP) 500 MCG/ML SOSY injection Inject 500 mcg into the skin See admin instructions. Inject 500 mcg into the skin every week    . lenalidomide (REVLIMID) 10 MG capsule Take 1 capsule (10 mg total) by mouth daily. Take for 21 days, then hold for 7 days. Repeat every 28 days. 21 capsule 0  . levothyroxine (SYNTHROID) 25 MCG tablet TAKE 1 TABLET BY MOUTH ONCE  A DAY BEFOREBREAKFAST. 90 tablet 1  . ondansetron (ZOFRAN ODT) 8 MG disintegrating tablet Take 1 tablet (8 mg total) by mouth every 8 (eight) hours as needed for nausea or vomiting. 30 tablet 0   No current facility-administered medications for this visit.      PHYSICAL EXAMINATION:   Vitals:   03/16/22 1304  BP: (!) 115/41  Pulse: 64  Resp: 20  Temp: (!) 96.6 F (35.9 C)  SpO2: 99%     Filed Weights   03/16/22 1304  Weight: 141 lb 12.8 oz (64.3 kg)      Physical Exam HENT:     Head: Normocephalic and atraumatic.     Mouth/Throat:     Pharynx: No oropharyngeal exudate.  Eyes:     Pupils: Pupils are equal, round, and reactive to light.  Cardiovascular:     Rate and Rhythm: Normal rate and regular rhythm.     Pulses: Normal pulses.     Heart sounds: Normal heart sounds.  Pulmonary:     Effort: Pulmonary effort is normal. No respiratory distress.     Breath sounds: Normal breath sounds. No wheezing.  Abdominal:     General:  Bowel sounds are normal. There is no distension.     Palpations: Abdomen is soft. There is no mass.     Tenderness: There is no abdominal tenderness. There is no guarding or rebound.  Musculoskeletal:        General: No tenderness. Normal range of motion.     Cervical back: Normal range of motion and neck supple.  Skin:    General: Skin is warm.  Neurological:     Mental Status: She is alert and oriented to person, place, and time.  Psychiatric:        Mood and Affect: Affect normal.    LABORATORY DATA:  I have reviewed the data as listed Lab Results  Component Value Date   WBC 2.6 (Carla) 03/16/2022   HGB 8.1 (Carla) 03/16/2022   HCT 25.2 (Carla) 03/16/2022   MCV 113.0 (H) 03/16/2022   PLT 146 (Carla) 03/16/2022   Recent Labs    01/19/22 1041 02/16/22 0911 03/16/22 1244  NA 135 137 139  K 3.8 3.4* 3.8  CL 104 109 106  CO2 26 25 27   GLUCOSE 115* 109* 98  BUN 17 13 14   CREATININE 0.98 0.98 1.02*  CALCIUM 8.7* 8.2* 8.6*  GFRNONAA >60 >60 59*  PROT 8.0 7.7 7.4  ALBUMIN 4.3 4.1 4.2  AST 19 22 17   ALT 15 16 13   ALKPHOS 41 41 42  BILITOT 1.2 1.5* 1.5*     MR SHOULDER RIGHT WO CONTRAST  Result Date: 03/16/2022 CLINICAL DATA:  Right shoulder pain for 1 year. EXAM: MRI OF THE RIGHT SHOULDER WITHOUT CONTRAST TECHNIQUE: Multiplanar, multisequence MR imaging of the shoulder was performed. No intravenous contrast was administered. COMPARISON:  None Available. FINDINGS: Rotator cuff: Mild subcortical cystic change within the humeral head deep to the anterior infraspinatus tendon insertion with a probable tiny 2 mm midsubstance partial-thickness tear of the overlying anterior infraspinatus tendon footprint (sagittal series 8, image 7 and coronal series 7, image 11). No tendon retraction. Mild anterior infraspinatus insertional tendinosis. There is a fluid bright high-grade partial-thickness tear of the articular side of the critical zone of the anterior supraspinatus tendon in the region measuring  up to 5 mm in AP dimension (sagittal series 8, image 9) with up to 9 mm tendon gap closer to the bursal surface and  4 mm tendon gap at the articular surface (coronal series 7 images fourteen and 15). This involves approximately 90% of the craniocaudal dimension of the tendon with only a small amount of intact bursal sided fibers. Moderate subcortical cystic change within the lesser tuberosity deep to the subscapularis tendon insertion. Moderate intermediate T2 signal and thickening tendinosis of the superior 50% of the subscapularis tendon insertion. The teres minor is intact. Muscles:  Mild-to-moderate supraspinatus muscle atrophy. Biceps long head: Moderate intermediate T2 signal and thickening tendinosis within the proximal long head of the biceps tendon, proximal to the bicipital groove. There Crosson be trace increased T2 signal indicating small partial-thickness interstitial tears within the biceps tendon within the bicipital groove (axial series 5, images 14 and 15). Acromioclavicular Joint: There are moderate degenerative changes of the acromioclavicular joint including joint space narrowing, subchondral marrow edema, and peripheral osteophytosis. Type II acromion. No subacromial/subdeltoid bursitis. Glenohumeral Joint: Moderate thinning of the glenoid and humeral head cartilage including high-grade partial to full-thickness cartilage loss within the inferior medial humeral head (coronal series 7, image 14). Labrum: Grossly intact, but evaluation is limited by lack of intraarticular fluid. Bones: There is benign fat seen centrally within the mid humeral head marrow. Otherwise, there is mildly decreased T1 signal seen throughout all the visualized bones which is nonspecific but remains hyperintense to skeletal muscle and [place] represent red marrow reconversion that can be seen in the setting of obesity, anemia, and/or smoking. Other: None. IMPRESSION: 1. High-grade partial-thickness articular sided tear of the  critical zone of the anterior supraspinatus tendon measuring up to 5 mm in AP dimension. 2. Anterior infraspinatus tendon insertion probable punctate 2 mm midsubstance insertional tear. 3. Moderate tendinosis of the superior 50% of the subscapularis tendon insertion. 4. Mild-to-moderate supraspinatus muscle atrophy. 5. Moderate proximal long head of biceps tendinosis. Possible tiny midsubstance interstitial tears within the biceps tendon within the bicipital groove. 6. Moderate degenerative changes of the acromioclavicular joint. 7. Moderate glenohumeral cartilage degenerative changes. Electronically Signed   By: Yvonne Kendall M.D.   On: 03/16/2022 12:46   LONG TERM MONITOR (3-14 DAYS)  Result Date: 02/22/2022 Event monitor Patch Wear Time:  13 days and 21 hours (2023-05-08T01:33:56-0400 to 2023-05-21T22:44:45-0400) Normal sinus rhythm Patient had a min HR of 40 bpm, max HR of 114 bpm, and avg HR of 64 bpm. 2  Supraventricular Tachycardia/atrial tachycardia runs occurred, the run with the fastest interval lasting 8 beats with a max rate of 114 bpm, the longest lasting 8 beats with an avg rate of 92 bpm. Isolated SVEs were rare (<1.0%), SVE Couplets were rare (<1.0%), and SVE Triplets were rare (<1.0%). Isolated VEs were rare (<1.0%), and no VE Couplets or VE Triplets were present. No patient triggered events recorded Signed, Esmond Plants, MD, Ph.D [facility]    MDS (myelodysplastic syndrome), low grade ([place]) # Low grade MDS- with ringed sideroblasts; no blasts. POSITIVE  For SF3B1; R-IPSS-low risk. . Currently on Aranesp 500 mcg; subcu injections; and  lenalidomide 10 mg  3w-ON & 1 w-OFF-tolerating fairly well except for mild GI effects/mild neutropenia. MARCH 2023-bone marrow biopsy no evidence of obvious progression of MDS or any acute process.  # Continue Aranesp-500 mcg every 2 weeks; along with Revlimid.  Today hemoglobin is 8.1.  Proceed with Aranesp. Continue revlimid 10 mg days 1-21 with 7 days  off; however leucopenia/thrombocytopenia- see below.  Again reviewed lack of significant response from current therapy and also reviewed/discussed re: luspatercept.   # leucopenia-Neutropenia/thrombocytopenia- HOLD revlimid starting  today/next cycle-given the dental cleaning plan on 7/12.  Patient to follow-up with me on July 10th.   # Diarrhea/dyspepsis-G-1-2; sec to revlimid- monitor for now; discussed regarding use of cholestyramine- /decrease fat intake. controlled with immodium prn. STABLE.   # Palpitations: Normal rate and rhythm- s/p evaluation with Dr.Gollan s/p monitor-STABLE.    # Right shoulder pain/radiating to Right UE- [several months]; X-ray with ortho [Chris gaines]; awaiting MRI shoulder- continue ice/ heat for -STABLE; . Continue prn NSAIDs.   # DISPOSITION: # proceed with Aranesp today # July 10th lab- MD; CBC/cmp;:LDH-  possible aranesp SQ  All questions were answered. The patient knows to call the clinic with any problems, questions or concerns.   Cammie Sickle, MD 03/16/2022 1:54 PM

## 2022-03-16 NOTE — Assessment & Plan Note (Addendum)
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 . Currently on Aranesp 500 mcg; subcu injections; and  lenalidomide 10 mg  3w-ON & 1 w-OFF-tolerating fairly well except for mild GI effects/mild neutropenia. MARCH 2023-bone marrow biopsy no evidence of obvious progression of MDS or any acute process.  # Continue Aranesp-500 mcg every 2 weeks; along with Revlimid.  Today hemoglobin is 8.1.  Proceed with Aranesp. Continue revlimid 10 mg days 1-21 with 7 days off; however leucopenia/thrombocytopenia- see below.  Again reviewed lack of significant response from current therapy and also reviewed/discussed re: luspatercept.   # leucopenia-Neutropenia/thrombocytopenia- HOLD revlimid starting today/next cycle-given the dental cleaning plan on 7/12.  Patient to follow-up with me on July 10th.   # Diarrhea/dyspepsis-G-1-2; sec to revlimid- monitor for now; discussed regarding use of cholestyramine- /decrease fat intake. controlled with immodium prn. STABLE.   # Palpitations: Normal rate and rhythm- s/p evaluation with Dr.Gollan s/p monitor-STABLE.    # Right shoulder pain/radiating to Right UE- [several months]; X-ray with ortho [Chris gaines]; awaiting MRI shoulder- continue ice/ heat for -STABLE; . Continue prn NSAIDs.   # DISPOSITION: # proceed with Aranesp today # July 10th lab- MD; CBC/cmp;:LDH-  possible aranesp SQ

## 2022-03-19 ENCOUNTER — Other Ambulatory Visit: Payer: Self-pay | Admitting: *Deleted

## 2022-03-19 DIAGNOSIS — D46Z Other myelodysplastic syndromes: Secondary | ICD-10-CM

## 2022-03-22 ENCOUNTER — Other Ambulatory Visit: Payer: Self-pay

## 2022-03-24 ENCOUNTER — Other Ambulatory Visit: Payer: Self-pay

## 2022-03-24 NOTE — Telephone Encounter (Signed)
Request pending dr approval.

## 2022-03-25 ENCOUNTER — Encounter: Payer: Self-pay | Admitting: Internal Medicine

## 2022-03-25 MED ORDER — LENALIDOMIDE 10 MG PO CAPS: 10.0000 mg | ORAL_CAPSULE | Freq: Every day | ORAL | 0 refills | Status: DC

## 2022-04-05 ENCOUNTER — Ambulatory Visit: Payer: PPO | Admitting: Internal Medicine

## 2022-04-05 ENCOUNTER — Ambulatory Visit: Payer: PPO

## 2022-04-05 ENCOUNTER — Other Ambulatory Visit: Payer: PPO

## 2022-04-12 ENCOUNTER — Inpatient Hospital Stay: Payer: PPO

## 2022-04-12 ENCOUNTER — Encounter: Payer: Self-pay | Admitting: Internal Medicine

## 2022-04-12 ENCOUNTER — Inpatient Hospital Stay: Payer: PPO | Attending: Internal Medicine

## 2022-04-12 ENCOUNTER — Inpatient Hospital Stay (HOSPITAL_BASED_OUTPATIENT_CLINIC_OR_DEPARTMENT_OTHER): Payer: PPO | Admitting: Internal Medicine

## 2022-04-12 DIAGNOSIS — D461 Refractory anemia with ring sideroblasts: Secondary | ICD-10-CM | POA: Diagnosis not present

## 2022-04-12 DIAGNOSIS — Z79890 Hormone replacement therapy (postmenopausal): Secondary | ICD-10-CM | POA: Insufficient documentation

## 2022-04-12 DIAGNOSIS — D46Z Other myelodysplastic syndromes: Secondary | ICD-10-CM

## 2022-04-12 DIAGNOSIS — E039 Hypothyroidism, unspecified: Secondary | ICD-10-CM | POA: Diagnosis not present

## 2022-04-12 LAB — CBC WITH DIFFERENTIAL/PLATELET
Abs Immature Granulocytes: 0.01 10*3/uL (ref 0.00–0.07)
Basophils Absolute: 0 10*3/uL (ref 0.0–0.1)
Basophils Relative: 1 %
Eosinophils Absolute: 0.1 10*3/uL (ref 0.0–0.5)
Eosinophils Relative: 3 %
HCT: 25.8 % — ABNORMAL LOW (ref 36.0–46.0)
Hemoglobin: 8.4 g/dL — ABNORMAL LOW (ref 12.0–15.0)
Immature Granulocytes: 0 %
Lymphocytes Relative: 34 %
Lymphs Abs: 1.1 10*3/uL (ref 0.7–4.0)
MCH: 36.5 pg — ABNORMAL HIGH (ref 26.0–34.0)
MCHC: 32.6 g/dL (ref 30.0–36.0)
MCV: 112.2 fL — ABNORMAL HIGH (ref 80.0–100.0)
Monocytes Absolute: 0.3 10*3/uL (ref 0.1–1.0)
Monocytes Relative: 9 %
Neutro Abs: 1.8 10*3/uL (ref 1.7–7.7)
Neutrophils Relative %: 53 %
Platelets: 280 10*3/uL (ref 150–400)
RBC: 2.3 MIL/uL — ABNORMAL LOW (ref 3.87–5.11)
RDW: 22.6 % — ABNORMAL HIGH (ref 11.5–15.5)
WBC: 3.3 10*3/uL — ABNORMAL LOW (ref 4.0–10.5)
nRBC: 0 % (ref 0.0–0.2)

## 2022-04-12 LAB — COMPREHENSIVE METABOLIC PANEL
ALT: 13 U/L (ref 0–44)
AST: 21 U/L (ref 15–41)
Albumin: 4.4 g/dL (ref 3.5–5.0)
Alkaline Phosphatase: 46 U/L (ref 38–126)
Anion gap: 5 (ref 5–15)
BUN: 14 mg/dL (ref 8–23)
CO2: 26 mmol/L (ref 22–32)
Calcium: 8.7 mg/dL — ABNORMAL LOW (ref 8.9–10.3)
Chloride: 107 mmol/L (ref 98–111)
Creatinine, Ser: 0.92 mg/dL (ref 0.44–1.00)
GFR, Estimated: >60 mL/min (ref >60)
Glucose, Bld: 145 mg/dL — ABNORMAL HIGH (ref 70–99)
Potassium: 3.9 mmol/L (ref 3.5–5.1)
Sodium: 138 mmol/L (ref 135–145)
Total Bilirubin: 1 mg/dL (ref 0.3–1.2)
Total Protein: 7.7 g/dL (ref 6.5–8.1)

## 2022-04-12 LAB — LACTATE DEHYDROGENASE: LDH: 100 U/L (ref 98–192)

## 2022-04-12 MED ORDER — DARBEPOETIN ALFA 500 MCG/ML IJ SOSY
500.0000 ug | PREFILLED_SYRINGE | Freq: Once | INTRAMUSCULAR | Status: AC
  Administered 2022-04-12: 500 ug via SUBCUTANEOUS
  Filled 2022-04-12: qty 1

## 2022-04-12 NOTE — Assessment & Plan Note (Addendum)
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 . Currently on Aranesp 500 mcg; subcu injections; and  lenalidomide 10 mg  3w-ON & 1 w-OFF-tolerating fairly well except for mild GI effects/mild neutropenia. MARCH 2023-bone marrow biopsy no evidence of obvious progression of MDS or any acute process.  # Continue Aranesp-500 mcg every 2 weeks; HOLDING Revlimid [sec to [place]- 1.1].  Today hemoglobin is 8.4.  Proceed with Aranesp. HOLD  revlimid 10 mg days 1-21 with 7 days off.  Given the poor response to Revlimid-I think it is reasonable to discontinue Revlimid moving forward.  We will proceed with Luspatercept at next visit.  Understand treatments are palliative and not curative.  # leucopenia-Neutropenia/thrombocytopenia- HOLD revlimid starting today/next cycle-given the dental cleaning plan on 7/19th  # Diarrhea/dyspepsis-G-1-2; sec to revlimid- monitor for now; discussed regarding use of cholestyramine- [place] fat intake. controlled with immodium prn. STABLE.   # Palpitations: Normal rate and rhythm- s/p evaluation with Dr.Gollan s/p monitor-STABLE.    # Right shoulder pain/radiating to Right UE; ortho [Chris gaines];  MRI shoulder- continue ice/ heat for -STABLE; . Continue prn NSAIDs.  Dental appt on 7/19- cleaning..    # DISPOSITION: # proceed with Aranesp today # q 2 weeks- H&H aranesp # q 4  weeks-MD;  labs CBC/cmp;:LDH-  possible aranesp; Luspatercept  SQ

## 2022-04-12 NOTE — Progress Notes (Signed)
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

## 2022-04-12 NOTE — Progress Notes (Signed)
MRI rt shoulder.  Swelling in feet for several weeks.

## 2022-04-12 NOTE — Progress Notes (Signed)
[facility] CONSULT NOTE  Patient Care Team: [name], [name], MD as PCP - General Rogue Bussing, [name], MD as Consulting Physician (Hematology and Oncology)  CHIEF COMPLAINTS/PURPOSE OF CONSULTATION: MDS   Oncology History Overview Note   # July 2020-myelodysplastic syndrome with ringed sideroblasts-Normal karyotype; no blasts [IPSS R-Very low risk ~median survival 8.3 years]; iron studies G25 folic acid myeloma panel normal; pyridoxine levels/copper/zinc-WNL. Erythropoietin levels-60. II OPINION at [place] [Dr.DeCastro]  # [facility]/ NGS: TET2([id number])c.2524delT(p.Ser842GlnfsTer31) Exon 3 frame-shift SF3B1([id number])c.2098A>G(p.Lys700Glu) Exon 15 missense  DNMT3A([id number])c.2204A>G(p.Tyr735Cys) Exon 19 missense   # JAN 11th 2020- Aranesp/retacrit;  # July 30th, 2021- START REVLIMID 10 mg/day  [SF3B67mtation]  MARCH 2023- BONE MARROW, ASPIRATE, CLOT, CORE:  -  Hypercellular marrow involved by myelodysplastic syndrome with ring  sideroblasts (MDS-RS)  -  Polytypic plasmacytosis   # [facility] 2023-increase the frequency of Aranesp 400 mcg every 2 weeks; continue Revlimid.   #Mild hypothyroidism-on Synthroid; September 2021 ejection fraction 60 to 65%;   # colonoscopy- 2016 [Dr.Bucinni];  DIAGNOSIS: MDS/low-grade with ringed sideroblasts  STAGE: Low       ;  GOALS: Control  CURRENT/MOST RECENT THERAPY : EPO agent+ REV    MDS (myelodysplastic syndrome), low grade ([place]  04/23/2019 Initial Diagnosis   M[place] ([place]elodysplastic [place]), [place] grade ([place]   04/12/2022 -  Chemotherapy   Patient is on Treatment Plan : MYELODYSPLASIA Luspatercept q21d       HISTORY OF PRESENTING ILLNESS: Alone.   Ambulating independently.  LJoey HudockMay 71 y.o.  female history of low-grade MDS anemia with ring sideroblasts currently on Aranesp/Revlimid is here for follow-up.  Patient awaiting dental cleaning in 2 days from now.  She is currently off Revlimid in anticipation of dental  pain.  In the interim s/p evaluation with orthopedics for right shoulder-noted to have right shoulder rotator cuff tear.  Chronic mild shortness of breath on exertion.  Otherwise denies no fever no chills.  No cough.  No nausea no vomiting. Chronic intermittent mild diarrhea.  Review of Systems  Constitutional:  Positive for malaise/fatigue. Negative for chills, diaphoresis and fever.  HENT:  Negative for nosebleeds and sore throat.   Eyes:  Negative for double vision.  Respiratory:  Negative for cough, hemoptysis, sputum production, shortness of breath and wheezing.   Cardiovascular:  Negative for chest pain and orthopnea.  Gastrointestinal:  Negative for abdominal pain, blood in stool, constipation, diarrhea, heartburn, melena and vomiting.  Genitourinary:  Negative for dysuria, frequency and urgency.  Musculoskeletal:  Positive for back pain and joint pain.  Skin: Negative.  Negative for itching and rash.  Neurological:  Negative for dizziness, tingling, focal weakness, weakness and headaches.  Endo/Heme/Allergies:  Does not bruise/bleed easily.  Psychiatric/Behavioral:  Negative for depression. The patient is not nervous/anxious and does not have insomnia.     MEDICAL HISTORY:  Past Medical History:  Diagnosis Date  . Allergic rhinitis, cause unspecified   . Anemia, unspecified   . Carpal tunnel syndrome   . Cystitis, unspecified   . Family history of osteoporosis   . PONV (postoperative nausea and vomiting)   . Postnasal drip   . Syncope and collapse     SURGICAL HISTORY: Past Surgical History:  Procedure Laterality Date  . BONE MARROW BIOPSY    . CARPAL TUNNEL RELEASE    . COLONOSCOPY  09/28/2003  . COLONOSCOPY WITH PROPOFOL N/A 07/31/2015   Procedure: COLONOSCOPY WITH PROPOFOL;  Surgeon: RRonald Lobo MD;  Location: [facility];  Service: Endoscopy;  Laterality: N/A;  .  DIAGNOSTIC LAPAROSCOPY     dx lack of pregnancy   . EYE SURGERY     lasik, cataract,  prk,yag procedure    SOCIAL HISTORY: Social History   Socioeconomic History  . Marital status: Married    Spouse name: Not on file  . Number of children: Not on file  . Years of education: Not on file  . Highest education level: Not on file  Occupational History  . Not on file  Tobacco Use  . Smoking status: Never  . Smokeless tobacco: Never  Vaping Use  . Vaping Use: Never used  Substance and Sexual Activity  . Alcohol use: Yes    Alcohol/week: 0.0 standard drinks of alcohol    Comment: Rare  . Drug use: No  . Sexual activity: Not Currently  Other Topics Concern  . Not on file  Social History Narrative   No regular exercise      Drinks lots of [employer]         Social Determinants of Health   Financial Resource Strain: Low Risk  (03/06/2020)   Overall Financial Resource Strain (CARDIA)   . Difficulty of Paying Living Expenses: Not hard at all  Food Insecurity: No Food Insecurity (03/06/2020)   Hunger Vital Sign   . Worried About Charity fundraiser in the Last Year: Never true   . Ran Out of Food in the Last Year: Never true  Transportation Needs: No Transportation Needs (03/06/2020)   PRAPARE - Transportation   . Lack of Transportation (Medical): No   . Lack of Transportation (Non-Medical): No  Physical Activity: Inactive (03/06/2020)   Exercise Vital Sign   . Days of Exercise per Week: 0 days   . Minutes of Exercise per Session: 0 min  Stress: No Stress Concern Present (03/06/2020)   [place]   . Feeling of Stress : Not at all  Social Connections: Not on file  Intimate Partner Violence: Not At Risk (03/06/2020)   Humiliation, Afraid, Rape, and Kick questionnaire   . Fear of Current or Ex-Partner: No   . Emotionally Abused: No   . Physically Abused: No   . Sexually Abused: No    FAMILY HISTORY: Family History  Problem Relation Age of Onset  . Osteoporosis Mother   . Stroke Mother   .  Coronary artery disease Father   . Stroke Father 62  . Diabetes Other        Aunts and uncles  . Breast cancer Paternal Aunt   . Breast cancer Maternal Aunt   . Arthritis/Rheumatoid Child   . Breast cancer Cousin     ALLERGIES:  is allergic to codeine.  MEDICATIONS:  Current Outpatient Medications  Medication Sig Dispense Refill  . acetaminophen (TYLENOL) 500 MG tablet Take 500-1,000 mg by mouth every 6 (six) hours as needed for mild pain or headache.    Marland Kitchen aspirin EC 81 MG tablet Take 81 mg by mouth at bedtime. Swallow whole.     . Biotin 10 MG CAPS Take by mouth.    . Calcium Carb-Cholecalciferol (CALCIUM-VITAMIN D3) 600-10 MG-MCG CAPS Take by mouth. Taking twice dailly    . Darbepoetin Alfa (ARANESP) 500 MCG/ML SOSY injection Inject 500 mcg into the skin See admin instructions. Inject 500 mcg into the skin every week    . levothyroxine (SYNTHROID) 25 MCG tablet TAKE 1 TABLET BY MOUTH ONCE A DAY BEFOREBREAKFAST. 90 tablet 1  . ondansetron (ZOFRAN ODT) 8 MG  disintegrating tablet Take 1 tablet (8 mg total) by mouth every 8 (eight) hours as needed for nausea or vomiting. 30 tablet 0  . lenalidomide (REVLIMID) 10 MG capsule Take 1 capsule (10 mg total) by mouth daily. Take for 21 days, then hold for 7 days. Repeat every 28 days. (Patient not taking: Reported on 04/12/2022) 21 capsule 0   No current facility-administered medications for this visit.      PHYSICAL EXAMINATION:   Vitals:   04/12/22 1129  BP: (!) 133/59  Pulse: 65  Temp: (!) 97.4 F (36.3 C)  SpO2: 100%     Filed Weights   04/12/22 1129  Weight: 143 lb 6.4 oz (65 kg)      Physical Exam HENT:     Head: Normocephalic and atraumatic.     Mouth/Throat:     Pharynx: No oropharyngeal exudate.  Eyes:     Pupils: Pupils are equal, round, and reactive to light.  Cardiovascular:     Rate and Rhythm: Normal rate and regular rhythm.     Pulses: Normal pulses.     Heart sounds: Normal heart sounds.  Pulmonary:      Effort: Pulmonary effort is normal. No respiratory distress.     Breath sounds: Normal breath sounds. No wheezing.  Abdominal:     General: Bowel sounds are normal. There is no distension.     Palpations: Abdomen is soft. There is no mass.     Tenderness: There is no abdominal tenderness. There is no guarding or rebound.  Musculoskeletal:        General: No tenderness. Normal range of motion.     Cervical back: Normal range of motion and neck supple.  Skin:    General: Skin is warm.  Neurological:     Mental Status: She is alert and oriented to person, place, and time.  Psychiatric:        Mood and Affect: Affect normal.    LABORATORY DATA:  I have reviewed the data as listed Lab Results  Component Value Date   WBC 3.3 (L) 04/12/2022   HGB 8.4 (L) 04/12/2022   HCT 25.8 (L) 04/12/2022   MCV 112.2 (H) 04/12/2022   PLT 280 04/12/2022   Recent Labs    02/16/22 0911 03/16/22 1244 04/12/22 1054  NA 137 139 138  K 3.4* 3.8 3.9  CL 109 106 107  CO2 _0 GLUCOSE 109* 98 145*  BUN _1 CREATININE 0.98 1.02* 0.92  CALCIUM 8.2* 8.6* 8.7*  GFRNONAA >60 59* >60  PROT 7.7 7.4 7.7  ALBUMIN 4.1 4.2 4.4  AST _2 ALT _3 ALKPHOS 41 42 46  BILITOT 1.5* 1.5* 1.0     MR SHOULDER RIGHT WO CONTRAST  Result Date: 03/16/2022 CLINICAL DATA:  Right shoulder pain for 1 year. EXAM: MRI OF THE RIGHT SHOULDER WITHOUT CONTRAST TECHNIQUE: Multiplanar, multisequence MR imaging of the shoulder was performed. No intravenous contrast was administered. COMPARISON:  None Available. FINDINGS: Rotator cuff: Mild subcortical cystic change within the humeral head deep to the anterior infraspinatus tendon insertion with a probable tiny 2 mm midsubstance partial-thickness tear of the overlying anterior infraspinatus tendon footprint (sagittal series 8, image 7 and coronal series 7, image 11). No tendon retraction. Mild anterior infraspinatus insertional tendinosis. There is a fluid  bright high-grade partial-thickness tear of the articular side of the critical zone of the anterior supraspinatus tendon in the region measuring up to 5 mm  in AP dimension (sagittal series 8, image 9) with up to 9 mm tendon gap closer to the bursal surface and 4 mm tendon gap at the articular surface (coronal series 7 images fourteen and 15). This involves approximately 90% of the craniocaudal dimension of the tendon with only a small amount of intact bursal sided fibers. Moderate subcortical cystic change within the lesser tuberosity deep to the subscapularis tendon insertion. Moderate intermediate T2 signal and thickening tendinosis of the superior 50% of the subscapularis tendon insertion. The teres minor is intact. Muscles:  Mild-to-moderate supraspinatus muscle atrophy. Biceps long head: Moderate intermediate T2 signal and thickening tendinosis within the proximal long head of the biceps tendon, proximal to the bicipital groove. There [place] be trace increased T2 signal indicating small partial-thickness interstitial tears within the biceps tendon within the bicipital groove (axial series 5, images 14 and 15). Acromioclavicular Joint: There are moderate degenerative changes of the acromioclavicular joint including joint space narrowing, subchondral marrow edema, and peripheral osteophytosis. Type II acromion. No subacromial/subdeltoid bursitis. Glenohumeral Joint: Moderate thinning of the glenoid and humeral head cartilage including high-grade partial to full-thickness cartilage loss within the inferior medial humeral head (coronal series 7, image 14). Labrum: Grossly intact, but evaluation is limited by lack of intraarticular fluid. Bones: There is benign fat seen centrally within the mid humeral head marrow. Otherwise, there is mildly decreased T1 signal seen throughout all the visualized bones which is nonspecific but remains hyperintense to skeletal muscle and [place] represent red marrow reconversion that can be  seen in the setting of obesity, anemia, and/or smoking. Other: None. IMPRESSION: 1. High-grade partial-thickness articular sided tear of the critical zone of the anterior supraspinatus tendon measuring up to 5 mm in AP dimension. 2. Anterior infraspinatus tendon insertion probable punctate 2 mm midsubstance insertional tear. 3. Moderate tendinosis of the superior 50% of the subscapularis tendon insertion. 4. Mild-to-moderate supraspinatus muscle atrophy. 5. Moderate proximal long head of biceps tendinosis. Possible tiny midsubstance interstitial tears within the biceps tendon within the bicipital groove. 6. Moderate degenerative changes of the acromioclavicular joint. 7. Moderate glenohumeral cartilage degenerative changes. Electronically Signed   By: Yvonne Kendall M.D.   On: 03/16/2022 12:46    MDS (myelodysplastic syndrome), low grade ([place]) # Low grade MDS- with ringed sideroblasts; no blasts. POSITIVE  For SF3B1; R-IPSS-low risk. . Currently on Aranesp 500 mcg; subcu injections; and  lenalidomide 10 mg  3w-ON & 1 w-OFF-tolerating fairly well except for mild GI effects/mild neutropenia. MARCH 2023-bone marrow biopsy no evidence of obvious progression of MDS or any acute process.  # Continue Aranesp-500 mcg every 2 weeks; HOLDING Revlimid [sec to [place]- 1.1].  Today hemoglobin is 8.4.  Proceed with Aranesp. HOLD  revlimid 10 mg days 1-21 with 7 days off.  Given the poor response to Revlimid-I think it is reasonable to discontinue Revlimid moving forward.  We will proceed with Luspatercept at next visit.  Understand treatments are palliative and not curative.  # leucopenia-Neutropenia/thrombocytopenia- HOLD revlimid starting today/next cycle-given the dental cleaning plan on 7/19th  # Diarrhea/dyspepsis-G-1-2; sec to revlimid- monitor for now; discussed regarding use of cholestyramine- [place] fat intake. controlled with immodium prn. STABLE.   # Palpitations: Normal rate and rhythm- s/p evaluation with  Dr.Gollan s/p monitor-STABLE.    # Right shoulder pain/radiating to Right UE; ortho [Chris gaines];  MRI shoulder- continue ice/ heat for -STABLE; . Continue prn NSAIDs.  Dental appt on 7/19- cleaning..    # DISPOSITION: # proceed with Aranesp today #  q 2 weeks- H&H aranesp # q 4  weeks-MD;  labs CBC/cmp;:LDH-  possible aranesp; Luspatercept  SQ  All questions were answered. The patient knows to call the clinic with any problems, questions or concerns.   Cammie Sickle, MD 04/12/2022 10:15 PM

## 2022-04-12 NOTE — Progress Notes (Signed)
Patient on plan of care prior to pathways. 

## 2022-04-19 ENCOUNTER — Other Ambulatory Visit: Payer: Self-pay

## 2022-04-22 ENCOUNTER — Telehealth: Payer: Self-pay | Admitting: Family Medicine

## 2022-04-22 DIAGNOSIS — R7989 Other specified abnormal findings of blood chemistry: Secondary | ICD-10-CM

## 2022-04-22 DIAGNOSIS — Z0000 Encounter for general adult medical examination without abnormal findings: Secondary | ICD-10-CM

## 2022-04-22 DIAGNOSIS — D696 Thrombocytopenia, unspecified: Secondary | ICD-10-CM

## 2022-04-22 DIAGNOSIS — E559 Vitamin D deficiency, unspecified: Secondary | ICD-10-CM

## 2022-04-22 DIAGNOSIS — E039 Hypothyroidism, unspecified: Secondary | ICD-10-CM

## 2022-04-22 DIAGNOSIS — R739 Hyperglycemia, unspecified: Secondary | ICD-10-CM

## 2022-04-22 NOTE — Telephone Encounter (Signed)
-----   Message from Ellamae Sia sent at 04/12/2022 12:04 PM EDT ----- Regarding: Lab orders for Friday, 7.28.23 Patient is scheduled for CPX labs, please order future labs, Thanks , Karna Christmas

## 2022-04-23 ENCOUNTER — Other Ambulatory Visit (INDEPENDENT_AMBULATORY_CARE_PROVIDER_SITE_OTHER): Payer: PPO

## 2022-04-23 DIAGNOSIS — Z0000 Encounter for general adult medical examination without abnormal findings: Secondary | ICD-10-CM

## 2022-04-23 DIAGNOSIS — E559 Vitamin D deficiency, unspecified: Secondary | ICD-10-CM | POA: Diagnosis not present

## 2022-04-23 DIAGNOSIS — R739 Hyperglycemia, unspecified: Secondary | ICD-10-CM

## 2022-04-23 DIAGNOSIS — E039 Hypothyroidism, unspecified: Secondary | ICD-10-CM | POA: Diagnosis not present

## 2022-04-23 LAB — TSH: TSH: 4.2 u[IU]/mL (ref 0.35–5.50)

## 2022-04-23 LAB — COMPREHENSIVE METABOLIC PANEL
ALT: 11 U/L (ref 0–35)
AST: 14 U/L (ref 0–37)
Albumin: 4.2 g/dL (ref 3.5–5.2)
Alkaline Phosphatase: 50 U/L (ref 39–117)
BUN: 17 mg/dL (ref 6–23)
CO2: 27 mEq/L (ref 19–32)
Calcium: 8.8 mg/dL (ref 8.4–10.5)
Chloride: 106 mEq/L (ref 96–112)
Creatinine, Ser: 0.92 mg/dL (ref 0.40–1.20)
GFR: 62.47 mL/min (ref >60.00)
Glucose, Bld: 88 mg/dL (ref 70–99)
Potassium: 3.9 mEq/L (ref 3.5–5.1)
Sodium: 139 mEq/L (ref 135–145)
Total Bilirubin: 0.9 mg/dL (ref 0.2–1.2)
Total Protein: 7.1 g/dL (ref 6.0–8.3)

## 2022-04-23 LAB — LIPID PANEL
Cholesterol: 138 mg/dL (ref 0–200)
HDL: 63.1 mg/dL (ref >39.00)
LDL Cholesterol: 66 mg/dL (ref 0–99)
NonHDL: 75.21
Total CHOL/HDL Ratio: 2
Triglycerides: 47 mg/dL (ref 0.0–149.0)
VLDL: 9.4 mg/dL (ref 0.0–40.0)

## 2022-04-23 LAB — HEMOGLOBIN A1C: Hgb A1c MFr Bld: 5.4 % (ref 4.6–6.5)

## 2022-04-23 LAB — VITAMIN D 25 HYDROXY (VIT D DEFICIENCY, FRACTURES): VITD: 7.88 ng/mL — ABNORMAL LOW (ref 30.00–100.00)

## 2022-04-26 ENCOUNTER — Inpatient Hospital Stay: Payer: PPO

## 2022-04-26 VITALS — BP 116/61 | HR 63

## 2022-04-26 DIAGNOSIS — D461 Refractory anemia with ring sideroblasts: Secondary | ICD-10-CM | POA: Diagnosis not present

## 2022-04-26 DIAGNOSIS — D46Z Other myelodysplastic syndromes: Secondary | ICD-10-CM

## 2022-04-26 LAB — HEMOGLOBIN: Hemoglobin: 7.1 g/dL — ABNORMAL LOW (ref 12.0–15.0)

## 2022-04-26 LAB — HEMATOCRIT: HCT: 21.5 % — ABNORMAL LOW (ref 36.0–46.0)

## 2022-04-26 MED ORDER — DARBEPOETIN ALFA 500 MCG/ML IJ SOSY
500.0000 ug | PREFILLED_SYRINGE | Freq: Once | INTRAMUSCULAR | Status: AC
  Administered 2022-04-26: 500 ug via SUBCUTANEOUS
  Filled 2022-04-26: qty 1

## 2022-04-27 ENCOUNTER — Other Ambulatory Visit: Payer: Self-pay

## 2022-04-29 ENCOUNTER — Ambulatory Visit (INDEPENDENT_AMBULATORY_CARE_PROVIDER_SITE_OTHER): Payer: PPO | Admitting: Family Medicine

## 2022-04-29 ENCOUNTER — Encounter: Payer: Self-pay | Admitting: Family Medicine

## 2022-04-29 VITALS — BP 100/52 | HR 72 | Ht 63.58 in | Wt 146.4 lb

## 2022-04-29 DIAGNOSIS — R739 Hyperglycemia, unspecified: Secondary | ICD-10-CM

## 2022-04-29 DIAGNOSIS — E559 Vitamin D deficiency, unspecified: Secondary | ICD-10-CM

## 2022-04-29 DIAGNOSIS — D46Z Other myelodysplastic syndromes: Secondary | ICD-10-CM

## 2022-04-29 DIAGNOSIS — Z0000 Encounter for general adult medical examination without abnormal findings: Secondary | ICD-10-CM | POA: Diagnosis not present

## 2022-04-29 DIAGNOSIS — E039 Hypothyroidism, unspecified: Secondary | ICD-10-CM

## 2022-04-29 MED ORDER — ERGOCALCIFEROL 1.25 MG (50000 UT) PO CAPS: 50000.0000 [IU] | ORAL_CAPSULE | ORAL | 0 refills | Status: DC

## 2022-04-29 MED ORDER — LEVOTHYROXINE SODIUM 25 MCG PO TABS
ORAL_TABLET | ORAL | 3 refills | Status: DC
Start: 2022-04-29 — End: 2023-06-21

## 2022-04-29 NOTE — Assessment & Plan Note (Signed)
Last Hb of 7.1  She is more fatigued/lightheaded at times with poor exercise tolerance  Continues oncology care  [place] need transfusion in near future

## 2022-04-29 NOTE — Assessment & Plan Note (Signed)
Lab Results  Component Value Date   HGBA1C 5.4 04/23/2022   Stable Encouraged more protein calories

## 2022-04-29 NOTE — Assessment & Plan Note (Signed)
Very low at 7.88 Pt high dose ergocalciferol weekly for 12 weeks Also 2000 iu daily D3 otc daily  Disc imp to bone and overall health

## 2022-04-29 NOTE — Progress Notes (Signed)
Subjective:    Patient ID: Carla [name], female    DOB: 1951-07-01, 71 y.o.   MRN: [id number]  HPI Here for health maintenance exam and to review chronic medical problems    Wt Readings from Last 3 Encounters:  04/29/22 146 lb 6.4 oz (66.4 kg)  04/12/22 143 lb 6.4 oz (65 kg)  03/16/22 141 lb 12.8 oz (64.3 kg)   25.46 kg/m  Is tired from lwo Hb but less then she would expect  ? If she will need a transfusion (if hb is below 7)  Also sob  Head is fuzzy  Got dizzy one time yesterday   She wore a heart monitor for a while-was ok  Does get more of a high heart rate   Gets to spend time with grandkids as much as possible  12 and 79 yo    Immunization History  Administered Date(s) Administered   Fluad Quad(high Dose 65+) 07/05/2019, 06/24/2020, 07/07/2021   Influenza Split 06/22/2011   Influenza Whole 06/26/2008, 07/15/2009, 08/17/2012   Influenza, High Dose Seasonal PF 07/13/2015, 06/27/2017   Influenza,inj,Quad PF,6+ Mos 07/18/2013   Influenza-Unspecified 07/11/2014, 07/09/2016   PFIZER(Purple Top)SARS-COV-2 Vaccination 11/03/2019, 11/24/2019   Pneumococcal Conjugate-13 10/10/2015   Pneumococcal Polysaccharide-23 12/14/2016   Tdap 06/22/2011   Zoster Recombinat (Shingrix) 07/31/2019, 12/19/2019   Zoster, Live 07/05/2012   Health Maintenance Due  Topic Date Due   COVID-19 Vaccine (3 - Pfizer risk series) 12/22/2019   TETANUS/TDAP  06/21/2021   INFLUENZA VACCINE  04/27/2022   Bp is low in setting of MDS   BP Readings from Last 3 Encounters:  04/29/22 (!) 100/52  04/26/22 116/61  04/12/22 (!) 133/59    Pulse Readings from Last 3 Encounters:  04/29/22 72  04/26/22 63  04/12/22 65   Close to needing a transfusion ?  Drinking fluids    Gets flu shot in the fall   Mammogram 05/2021 Self breast exam: no lumps   Colonoscopy 07/2015   Dexa 01/2018 normal Falls: none  Fractures: none  Supplements : ca plus D - had to back off due to stomach issues / takes  sporadically  Vit d level low at 7.88  Exercise :limited due to severe anemia   Vision/hearing  Hearing Screening   _0  _1  _2  _3   Right ear 40 40 40 40  Left ear _4 Vision Screening   Right eye Left eye Both eyes  Without correction _5  With correction      Does not notice change in hearing   Hypothyroidism  Pt has no clinical changes No change in energy level/ hair or skin/ edema and no tremor Lab Results  Component Value Date   TSH 4.20 04/23/2022   Levothyroxine 25 mcg daily   MDS /low grade  Lab Results  Component Value Date   WBC 3.3 (L) 04/12/2022   HGB 7.1 (L) 04/26/2022   HCT 21.5 (L) 04/26/2022   MCV 112.2 (H) 04/12/2022   PLT 280 04/12/2022  Hb is down from 8 to 7 range   Aranesp- had it Monday (hopes to bring it up)  Chemo pill was not working any more /counts are dropping again  Bilal start a different injection at follow up She sees oncology 2 wk     Cholesterol Lab Results  Component Value Date   CHOL 138 04/23/2022   CHOL 145 04/23/2021   CHOL 135 03/05/2020   Lab Results  Component Value Date  HDL 63.10 04/23/2022   HDL 62.50 04/23/2021   HDL 53.80 03/05/2020   Lab Results  Component Value Date   LDLCALC 66 04/23/2022   LDLCALC 70 04/23/2021   LDLCALC 70 03/05/2020   Lab Results  Component Value Date   TRIG 47.0 04/23/2022   TRIG 63.0 04/23/2021   TRIG 54.0 03/05/2020   Lab Results  Component Value Date   CHOLHDL 2 04/23/2022   CHOLHDL 2 04/23/2021   CHOLHDL 3 03/05/2020   Lab Results  Component Value Date   LDLDIRECT 123.0 09/16/2009   LDLDIRECT 136.9 06/26/2008   Eats well  Good genetics also    Elevated glucose Lab Results  Component Value Date   HGBA1C 5.4 04/23/2022   Lab Results  Component Value Date   CREATININE 0.92 04/23/2022   BUN 17 04/23/2022   NA 139 04/23/2022   K 3.9 04/23/2022   CL 106 04/23/2022   CO2 27 04/23/2022   Lab Results  Component Value Date    ALT 11 04/23/2022   AST 14 04/23/2022   ALKPHOS 50 04/23/2022   BILITOT 0.9 04/23/2022   Patient Active Problem List   Diagnosis Date Noted   Medicare annual wellness visit, subsequent 04/27/2021   Encounter for screening mammogram for breast cancer 04/27/2021   Vitamin D deficiency 04/27/2021   Symptomatic anemia 05/17/2020   Goals of care, counseling/discussion 04/15/2020   MDS (myelodysplastic syndrome), 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[place]) 04/23/2019   Hypothyroidism 12/20/2017   Blood glucose elevated 12/20/2017   Normocytic anemia 12/19/2017   Initial Medicare annual wellness visit 10/10/2015   Estrogen deficiency 10/10/2015   Encounter for routine gynecological examination 10/08/2014   Other screening mammogram 07/05/2012   Routine gynecological examination 06/22/2011   Routine general medical examination at a health care facility 06/15/2011   ALLERGIC RHINITIS 06/26/2008   Past Medical History:  Diagnosis Date   Allergic rhinitis, cause unspecified    Anemia, unspecified    Carpal tunnel syndrome    Cystitis, unspecified    Family history of osteoporosis    PONV (postoperative nausea and vomiting)    Postnasal drip    Syncope and collapse    Past Surgical History:  Procedure Laterality Date   BONE MARROW BIOPSY     CARPAL TUNNEL RELEASE     COLONOSCOPY  09/28/2003   COLONOSCOPY WITH PROPOFOL N/A 07/31/2015   Procedure: COLONOSCOPY WITH PROPOFOL;  Surgeon: Ronald Lobo, MD;  Location: [facility];  Service: Endoscopy;  Laterality: N/A;   DIAGNOSTIC LAPAROSCOPY     dx lack of pregnancy    EYE SURGERY     lasik, cataract, prk,yag procedure   Social History   Tobacco Use   Smoking status: Never   Smokeless tobacco: Never  Vaping Use   Vaping Use: Never used  Substance Use Topics   Alcohol use: Yes    Alcohol/week: 0.0 standard drinks of alcohol    Comment: Rare   Drug use: No   Family History  Problem Relation Age of Onset   Osteoporosis Mother    Stroke Mother     Coronary artery disease Father    Stroke Father 67   Diabetes Other        Aunts and uncles   Breast cancer Paternal Aunt    Breast cancer Maternal Aunt    Arthritis/Rheumatoid Child    Breast cancer Cousin    Allergies  Allergen Reactions   Codeine Nausea Only   Current Outpatient Medications on File Prior to Visit  Medication  Sig Dispense Refill   acetaminophen (TYLENOL) 500 MG tablet Take 500-1,000 mg by mouth every 6 (six) hours as needed for mild pain or headache.     aspirin EC 81 MG tablet Take 81 mg by mouth at bedtime. Swallow whole.      Biotin 10 MG CAPS Take by mouth.     Calcium Carb-Cholecalciferol (CALCIUM-VITAMIN D3) 600-10 MG-MCG CAPS Take by mouth. Taking twice dailly     Darbepoetin Alfa (ARANESP) 500 MCG/ML SOSY injection Inject 500 mcg into the skin See admin instructions. Inject 500 mcg into the skin every week     ondansetron (ZOFRAN ODT) 8 MG disintegrating tablet Take 1 tablet (8 mg total) by mouth every 8 (eight) hours as needed for nausea or vomiting. 30 tablet 0   No current facility-administered medications on file prior to visit.     Review of Systems  Constitutional:  Positive for fatigue. Negative for activity change, appetite change, fever and unexpected weight change.  HENT:  Negative for congestion, ear pain, rhinorrhea, sinus pressure and sore throat.   Eyes:  Negative for pain, redness and visual disturbance.  Respiratory:  Positive for shortness of breath. Negative for cough, wheezing and stridor.   Cardiovascular:  Negative for chest pain, palpitations and leg swelling.  Gastrointestinal:  Negative for abdominal pain, blood in stool, constipation and diarrhea.  Endocrine: Negative for polydipsia and polyuria.  Genitourinary:  Negative for dysuria, frequency and urgency.  Musculoskeletal:  Negative for arthralgias, back pain and myalgias.  Skin:  Negative for pallor and rash.  Allergic/Immunologic: Negative for environmental allergies.   Neurological:  Positive for light-headedness. Negative for dizziness, syncope and headaches.  Hematological:  Negative for adenopathy. Does not bruise/bleed easily.  Psychiatric/Behavioral:  Negative for decreased concentration and dysphoric mood. The patient is not nervous/anxious.        Objective:   Physical Exam Constitutional:      General: She is not in acute distress.    Appearance: Normal appearance. She is well-developed and normal weight. She is not ill-appearing or diaphoretic.  HENT:     Head: Normocephalic and atraumatic.     Right Ear: Tympanic membrane, ear canal and external ear normal.     Left Ear: Tympanic membrane, ear canal and external ear normal.     Nose: Nose normal. No congestion.     Mouth/Throat:     Mouth: Mucous membranes are moist.     Pharynx: Oropharynx is clear. No posterior oropharyngeal erythema.  Eyes:     General: No scleral icterus.    Extraocular Movements: Extraocular movements intact.     Conjunctiva/sclera: Conjunctivae normal.     Pupils: Pupils are equal, round, and reactive to light.  Neck:     Thyroid: No thyromegaly.     Vascular: No carotid bruit or JVD.  Cardiovascular:     Rate and Rhythm: Normal rate and regular rhythm.     Pulses: Normal pulses.     Heart sounds: Normal heart sounds.     No gallop.  Pulmonary:     Effort: Pulmonary effort is normal. No respiratory distress.     Breath sounds: Normal breath sounds. No wheezing.     Comments: Good air exch Chest:     Chest wall: No tenderness.  Abdominal:     General: Bowel sounds are normal. There is no distension or abdominal bruit.     Palpations: Abdomen is soft. There is no mass.     Tenderness: There is no abdominal  tenderness.     Hernia: No hernia is present.  Genitourinary:    Comments: Breast exam: No mass, nodules, thickening, tenderness, bulging, retraction, inflamation, nipple discharge or skin changes noted.  No axillary or clavicular LA.      Musculoskeletal:        General: No tenderness. Normal range of motion.     Cervical back: Normal range of motion and neck supple. No rigidity. No muscular tenderness.     Right lower leg: No edema.     Left lower leg: No edema.     Comments: No kyphosis   Lymphadenopathy:     Cervical: No cervical adenopathy.  Skin:    General: Skin is warm and dry.     Coloration: Skin is pale. Skin is not jaundiced.     Findings: No bruising, erythema, lesion or rash.     Comments: Fair  Some lentigines   Neurological:     Mental Status: She is alert. Mental status is at baseline.     Cranial Nerves: No cranial nerve deficit.     Motor: No abnormal muscle tone.     Coordination: Coordination normal.     Gait: Gait normal.     Deep Tendon Reflexes: Reflexes are normal and symmetric. Reflexes normal.  Psychiatric:        Mood and Affect: Mood normal.        Cognition and Memory: Cognition and memory normal.           Assessment & Plan:   Problem List Items Addressed This Visit       Endocrine   Hypothyroidism    Hypothyroidism  Pt has no clinical changes No change in energy level/ hair or skin/ edema and no tremor Lab Results  Component Value Date   TSH 4.20 04/23/2022    Plan to continue levothyroxine 25 mcg daily       Relevant Medications   levothyroxine (SYNTHROID) 25 MCG tablet     Other   Blood glucose elevated    Lab Results  Component Value Date   HGBA1C 5.4 04/23/2022  Stable Encouraged more protein calories       MDS (myelodysplastic syndrome), low grade ([facility])    Last Hb of 7.1  She is more fatigued/lightheaded at times with poor exercise tolerance  Continues oncology care  [place] need transfusion in near future      Routine general medical examination at a health care facility - Primary    Reviewed health habits including diet and exercise and skin cancer prevention Reviewed appropriate screening tests for age  Also reviewed health mt list, fam hx and  immunization status , as well as social and family history   Self care is fair in setting of MDS treatment  Continues onc care Plans flu shot in the fall  Mammogram utd -due in sept  Colonoscopy utd  dexa utd 2019, no falls or fx  Disc need for vitamin D  Vision /hearing screen reviewed  Good lipid profile      Vitamin D deficiency    Very low at 7.88 Pt high dose ergocalciferol weekly for 12 weeks Also 2000 iu daily D3 otc daily  Disc imp to bone and overall health

## 2022-04-29 NOTE — Assessment & Plan Note (Signed)
Hypothyroidism  Pt has no clinical changes No change in energy level/ hair or skin/ edema and no tremor Lab Results  Component Value Date   TSH 4.20 04/23/2022    Plan to continue levothyroxine 25 mcg daily

## 2022-04-29 NOTE — Patient Instructions (Addendum)
Take the prescription vitamin D weekly for 12 weeks  Also take 2000 iu of vitamin D3 over the counter daily from now on (over the counter)   If you can take some calcium-only do do if it does not bother your stomach   Eat regular meals with protein   I will touch base with oncology If you feel worse-call them as well   Keep up fluids Liberalize salt (salty foods) as well   Take care of yourself

## 2022-04-29 NOTE — Assessment & Plan Note (Signed)
Reviewed health habits including diet and exercise and skin cancer prevention Reviewed appropriate screening tests for age  Also reviewed health mt list, fam hx and immunization status , as well as social and family history   Self care is fair in setting of MDS treatment  Continues onc care Plans flu shot in the fall  Mammogram utd -due in sept  Colonoscopy utd  dexa utd 2019, no falls or fx  Disc need for vitamin D  Vision /hearing screen reviewed  Good lipid profile

## 2022-04-30 ENCOUNTER — Other Ambulatory Visit: Payer: Self-pay

## 2022-05-03 DIAGNOSIS — M75111 Incomplete rotator cuff tear or rupture of right shoulder, not specified as traumatic: Secondary | ICD-10-CM | POA: Insufficient documentation

## 2022-05-03 DIAGNOSIS — M7521 Bicipital tendinitis, right shoulder: Secondary | ICD-10-CM | POA: Diagnosis not present

## 2022-05-03 DIAGNOSIS — M7581 Other shoulder lesions, right shoulder: Secondary | ICD-10-CM | POA: Diagnosis not present

## 2022-05-07 ENCOUNTER — Inpatient Hospital Stay: Payer: PPO | Attending: Internal Medicine | Admitting: Licensed Clinical Social Worker

## 2022-05-07 DIAGNOSIS — D461 Refractory anemia with ring sideroblasts: Secondary | ICD-10-CM | POA: Insufficient documentation

## 2022-05-07 DIAGNOSIS — D46Z Other myelodysplastic syndromes: Secondary | ICD-10-CM

## 2022-05-07 NOTE — Progress Notes (Signed)
[facility]  Initial Assessment   Carla [name] is a 71 y.o. year old female contacted by phone. Clinical Social Work was referred by medical provider for assessment of psychosocial needs.   SDOH (Social Determinants of Health) assessments performed: Yes SDOH Interventions    Flowsheet Row Most Recent Value  SDOH Interventions   Food Insecurity Interventions Intervention Not Indicated  Financial Strain Interventions Intervention Not Indicated, Other (Comment)  [Financial Assistance for chemo pill and injection]  Housing Interventions Intervention Not Indicated  Physical Activity Interventions Intervention Not Indicated  Stress Interventions Intervention Not Indicated  Social Connections Interventions Intervention Not Indicated  Transportation Interventions Patient Resources (Friends/Family), Intervention Not Indicated       SDOH Screenings   Alcohol Screen: Low Risk  (05/07/2022)   Alcohol Screen    Last Alcohol Screening Score (AUDIT): 0  Depression (PHQ2-9): Low Risk  (05/07/2022)   Depression (PHQ2-9)    PHQ-2 Score: 0  Financial Resource Strain: Low Risk  (05/07/2022)   Overall Financial Resource Strain (CARDIA)    Difficulty of Paying Living Expenses: Not very hard  Food Insecurity: No Food Insecurity (05/07/2022)   Hunger Vital Sign    Worried About Running Out of Food in the Last Year: Never true    Ran Out of Food in the Last Year: Never true  Housing: Low Risk  (05/07/2022)   Housing    Last Housing Risk Score: 0  Physical Activity: Inactive (05/07/2022)   Exercise Vital Sign    Days of Exercise per Week: 0 days    Minutes of Exercise per Session: 0 min  Social Connections: Moderately Integrated (05/07/2022)   Social Connection and Isolation Panel [NHANES]    Frequency of Communication with Friends and Family: More than three times a week    Frequency of Social Gatherings with Friends and Family: Once a week    Attends Religious Services: More than 4 times  per year    Active Member of Clubs or Organizations: No    Attends Archivist Meetings: Never    Marital Status: Married  Stress: No Stress Concern Present (05/07/2022)   [place]    Feeling of Stress : Only a little  Tobacco Use: Low Risk  (04/29/2022)   Patient History    Smoking Tobacco Use: Never    Smokeless Tobacco Use: Never    Passive Exposure: Not on file  Transportation Needs: No Transportation Needs (05/07/2022)   PRAPARE - Transportation    Lack of Transportation (Medical): No    Lack of Transportation (Non-Medical): No     Distress Screen completed: No     No data to display            Family/Social Information:  Housing Arrangement: patient lives with spouse  [name],[name] [phone] Family members/support persons in your life? Family, Neighbors, and Geophysical data processor concerns: no  Employment: Retired  .  Income source: Paediatric nurse concerns: Yes, current concerns Type of concern: Medical bills and specifically medication  Food access concerns: no Religious or spiritual practice: Yes-  Services Currently in place:  HTN  Coping/ Adjustment to diagnosis: Patient understands treatment plan and what happens next? yes Concerns about diagnosis and/or treatment:  financial concerns about medication costs Patient reported stressors: Finances and specifically medication Hopes and/or priorities: N/A Patient enjoys time with family/ friends Current coping skills/ strengths: Ability for insight , Active sense of humor , Average or  above average intelligence , Capable of independent living , Communication skills , Scientist, research (life sciences) , General fund of knowledge , Motivation for treatment/growth , Physical Health , Religious Affiliation , Special hobby/interest , and Supportive family/friends     SUMMARY: Current SDOH Barriers:  Financial constraints related to  fixed income, Medication procurement, and concerns about medication costs  Clinical Social Work Clinical Goal(s):  CSW will research the Specialty Pharm[facility] Patient Advocate contact information, and request for the patient advocate to contact the patient to discuss grant approval to assist with medication costs.  Interventions: Discussed common feeling and emotions when being diagnosed with cancer, and the importance of support during treatment Informed patient of the support team roles and support services at [facility] Provided CSW contact information and encouraged patient to call with any questions or concerns Provided patient with information about CSW role in patient care and other available resources.   Follow Up Plan: Patient will contact CSW with any support or resource needs Patient verbalizes understanding of plan: Yes    [place], LCSW

## 2022-05-08 ENCOUNTER — Other Ambulatory Visit: Payer: Self-pay

## 2022-05-10 ENCOUNTER — Inpatient Hospital Stay: Payer: PPO | Admitting: Licensed Clinical Social Worker

## 2022-05-10 DIAGNOSIS — D46Z Other myelodysplastic syndromes: Secondary | ICD-10-CM

## 2022-05-10 NOTE — Progress Notes (Signed)
[place] CSW Progress Note  Clinical Education officer, museum contacted patient by phone to discuss grant for medication.  CSW explained to patient that I will refer her to our grant fund manager Ulice Dash and she will be abel to assist patient with more information about grant criteria for medication assistance. Patient verbalized understanding.    Adelene Amas, LCSW

## 2022-05-11 ENCOUNTER — Inpatient Hospital Stay (HOSPITAL_BASED_OUTPATIENT_CLINIC_OR_DEPARTMENT_OTHER): Payer: PPO | Admitting: Internal Medicine

## 2022-05-11 ENCOUNTER — Inpatient Hospital Stay: Payer: PPO

## 2022-05-11 ENCOUNTER — Encounter: Payer: Self-pay | Admitting: Internal Medicine

## 2022-05-11 VITALS — BP 106/57 | HR 73 | Temp 96.5°F | Ht 63.58 in | Wt 145.6 lb

## 2022-05-11 DIAGNOSIS — D46Z Other myelodysplastic syndromes: Secondary | ICD-10-CM

## 2022-05-11 DIAGNOSIS — D461 Refractory anemia with ring sideroblasts: Secondary | ICD-10-CM | POA: Diagnosis not present

## 2022-05-11 LAB — CBC WITH DIFFERENTIAL/PLATELET
Abs Immature Granulocytes: 0.02 10*3/uL (ref 0.00–0.07)
Basophils Absolute: 0 10*3/uL (ref 0.0–0.1)
Basophils Relative: 1 %
Eosinophils Absolute: 0.1 10*3/uL (ref 0.0–0.5)
Eosinophils Relative: 2 %
HCT: 22.9 % — ABNORMAL LOW (ref 36.0–46.0)
Hemoglobin: 7.4 g/dL — ABNORMAL LOW (ref 12.0–15.0)
Immature Granulocytes: 1 %
Lymphocytes Relative: 32 %
Lymphs Abs: 1.1 10*3/uL (ref 0.7–4.0)
MCH: 36.1 pg — ABNORMAL HIGH (ref 26.0–34.0)
MCHC: 32.3 g/dL (ref 30.0–36.0)
MCV: 111.7 fL — ABNORMAL HIGH (ref 80.0–100.0)
Monocytes Absolute: 0.2 10*3/uL (ref 0.1–1.0)
Monocytes Relative: 6 %
Neutro Abs: 2 10*3/uL (ref 1.7–7.7)
Neutrophils Relative %: 58 %
Platelets: 270 10*3/uL (ref 150–400)
RBC: 2.05 MIL/uL — ABNORMAL LOW (ref 3.87–5.11)
RDW: 23.5 % — ABNORMAL HIGH (ref 11.5–15.5)
WBC: 3.4 10*3/uL — ABNORMAL LOW (ref 4.0–10.5)
nRBC: 1.5 % — ABNORMAL HIGH (ref 0.0–0.2)

## 2022-05-11 LAB — COMPREHENSIVE METABOLIC PANEL
ALT: 16 U/L (ref 0–44)
AST: 20 U/L (ref 15–41)
Albumin: 4.3 g/dL (ref 3.5–5.0)
Alkaline Phosphatase: 53 U/L (ref 38–126)
Anion gap: 7 (ref 5–15)
BUN: 22 mg/dL (ref 8–23)
CO2: 26 mmol/L (ref 22–32)
Calcium: 9 mg/dL (ref 8.9–10.3)
Chloride: 105 mmol/L (ref 98–111)
Creatinine, Ser: 0.96 mg/dL (ref 0.44–1.00)
GFR, Estimated: >60 mL/min (ref >60)
Glucose, Bld: 136 mg/dL — ABNORMAL HIGH (ref 70–99)
Potassium: 4 mmol/L (ref 3.5–5.1)
Sodium: 138 mmol/L (ref 135–145)
Total Bilirubin: 1.2 mg/dL (ref 0.3–1.2)
Total Protein: 7.9 g/dL (ref 6.5–8.1)

## 2022-05-11 LAB — LACTATE DEHYDROGENASE: LDH: 116 U/L (ref 98–192)

## 2022-05-11 MED ORDER — LUSPATERCEPT-AAMT 75 MG ~~LOC~~ SOLR
1.0000 mg/kg | Freq: Once | SUBCUTANEOUS | Status: AC
  Administered 2022-05-11: 65 mg via SUBCUTANEOUS
  Filled 2022-05-11: qty 1.3

## 2022-05-11 MED ORDER — DARBEPOETIN ALFA 500 MCG/ML IJ SOSY
500.0000 ug | PREFILLED_SYRINGE | Freq: Once | INTRAMUSCULAR | Status: AC
  Administered 2022-05-11: 500 ug via SUBCUTANEOUS
  Filled 2022-05-11: qty 1

## 2022-05-11 NOTE — Assessment & Plan Note (Addendum)
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 . Currently on Aranesp 500 mcg; subcu injections; and  lenalidomide 10 mg  3w-ON & 1 w-OFF-tolerating fairly well except for mild GI effects/mild neutropenia. MARCH 2023-bone marrow biopsy no evidence of obvious progression of MDS or any acute process.  Discontinue Revlimid-July 2023.  #Continue Aranesp 100 mcg every 2-3 weeks.  Proceed with Luspatercept every 3 weeks.  Again discussed the potential mechanism of action and side effects including fatigue from Luspatercept.  Hemoglobin today is 7.4.-Discussed with patient regarding blood transfusion.  We will hold off for now.  # Diarrhea/dyspepsis-G-1-2; sec to revlimid- monitor for now; discussed regarding use of cholestyramine- /decrease fat intake. controlled with immodium prn. STABLE.   # Palpitations: Normal rate and rhythm- s/p evaluation with Dr.Gollan s/p monitor-STABLE.    # Right shoulder pain/radiating to Right UE; ortho [Chris gaines];  MRI shoulder- continue ice/ heat for . -STABLE; Continue prn NSAIDs.   # DISPOSITION:  # proceed with Aranesp and Luspatecept  Today;  # follow up in 3 weeks--MD;  labs CBC/cmp;:LDH; HOLD tube;  possible aranesp; Luspatercept  SQ; D-2 possible 1 unit of PRBC- dr.B

## 2022-05-11 NOTE — Progress Notes (Signed)
[facility] CONSULT NOTE  Patient Care Team: [name], [name], MD as PCP - General Rogue Bussing, [name], MD as Consulting Physician (Hematology and Oncology)  CHIEF COMPLAINTS/PURPOSE OF CONSULTATION: MDS   Oncology History Overview Note   # July 2020-myelodysplastic syndrome with ringed sideroblasts-Normal karyotype; no blasts [IPSS R-Very low risk ~median survival 8.3 years]; iron studies T03 folic acid myeloma panel normal; pyridoxine levels/copper/zinc-WNL. Erythropoietin levels-60. II OPINION at [place] [Dr.DeCastro]  # [facility]/ NGS: TET2([id number])c.2524delT(p.Ser842GlnfsTer31) Exon 3 frame-shift SF3B1([id number])c.2098A>G(p.Lys700Glu) Exon 15 missense  DNMT3A([id number])c.2204A>G(p.Tyr735Cys) Exon 19 missense   # JAN 11th 2020- Aranesp/retacrit;  # July 30th, 2021- START REVLIMID 10 mg/day  [SF3B102mtation]  MARCH 2023- BONE MARROW, ASPIRATE, CLOT, CORE:  -  Hypercellular marrow involved by myelodysplastic syndrome with ring  sideroblasts (MDS-RS)  -  Polytypic plasmacytosis   # MARCh 2023-increase the frequency of Aranesp 400 mcg every 2 weeks; continue Revlimid.   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; September 2021 ejection fraction 60 to 65%;   # colonoscopy- 2016 [Dr.Bucinni];  DIAGNOSIS: MDS/low-grade with ringed sideroblasts  STAGE: Low       ;  GOALS: Control  CURRENT/MOST RECENT THERAPY : EPO agent+ REV    MDS (myelodysplastic syndrome), low grade ([facility]  04/23/2019 Initial Diagnosis   MDS (myelodysplastic syndrome), low grade ([facility]   05/11/2022 - 05/11/2022 Chemotherapy   Patient is on Treatment Plan : MYELODYSPLASIA Luspatercept q21d     05/11/2022 -  Chemotherapy   Patient is on Treatment Plan : MYELODYSPLASIA Luspatercept q21d       HISTORY OF PRESENTING ILLNESS: Accompanied by husband ambulating independently.  Carla LewisonMay 70 y.o.  female history of low-grade MDS  anemia with ring sideroblasts currently on Aranesp/Revlimid is here for follow-up.  However Revlimid has been discontinued at last visit approximately a month ago because lack of response.  Patient continues to have right shoulder pain; rotator cuff tear-however not any worse.  Currently no surgical plans.  Chronic mild shortness of breath on exertion.  Otherwise denies no fever no chills.  No cough.  No nausea no vomiting. Chronic intermittent mild diarrhea.  Review of Systems  Constitutional:  Positive for malaise/fatigue. Negative for chills, diaphoresis and fever.  HENT:  Negative for nosebleeds and sore throat.   Eyes:  Negative for double vision.  Respiratory:  Negative for cough, hemoptysis, sputum production, shortness of breath and wheezing.   Cardiovascular:  Negative for chest pain and orthopnea.  Gastrointestinal:  Negative for abdominal pain, blood in stool, constipation, diarrhea, heartburn, melena and vomiting.  Genitourinary:  Negative for dysuria, frequency and urgency.  Musculoskeletal:  Positive for back pain and joint pain.  Skin: Negative.  Negative for itching and rash.  Neurological:  Negative for dizziness, tingling, focal weakness, weakness and headaches.  Endo/Heme/Allergies:  Does not bruise/bleed easily.  Psychiatric/Behavioral:  Negative for depression. The patient is not nervous/anxious and does not have insomnia.     MEDICAL HISTORY:  Past Medical History:  Diagnosis Date   Allergic rhinitis, cause unspecified    Anemia, unspecified    Carpal tunnel syndrome    Cystitis, unspecified    Family history of osteoporosis    PONV (postoperative nausea and vomiting)    Postnasal drip    Syncope and collapse     SURGICAL HISTORY: Past Surgical History:  Procedure Laterality Date   BONE MARROW BIOPSY     CARPAL TUNNEL RELEASE     COLONOSCOPY  09/28/2003   COLONOSCOPY WITH PROPOFOL N/A 07/31/2015   Procedure: COLONOSCOPY WITH PROPOFOL;  Surgeon: Ronald Lobo, MD;  Location: [facility];  Service: Endoscopy;  Laterality: N/A;   DIAGNOSTIC LAPAROSCOPY     dx lack of pregnancy    EYE SURGERY     lasik, cataract, prk,yag procedure    SOCIAL HISTORY: Social History   Socioeconomic History   Marital status: Married    Spouse name: Not on file   Number of children: Not on file   Years of education: Not on file   Highest education level: Not on file  Occupational History   Not on file  Tobacco Use   Smoking status: Never   Smokeless tobacco: Never  Vaping Use   Vaping Use: Never used  Substance and Sexual Activity   Alcohol use: Yes    Alcohol/week: 0.0 standard drinks of alcohol    Comment: Rare   Drug use: No   Sexual activity: Not Currently  Other Topics Concern   Not on file  Social History Narrative   No regular exercise      Drinks lots of [employer]         Social Determinants of Health   Financial Resource Strain: Low Risk  (05/07/2022)   Overall Financial Resource Strain (CARDIA)    Difficulty of Paying Living Expenses: Not very hard  Food Insecurity: No Food Insecurity (05/07/2022)   Hunger Vital Sign    Worried About Running Out of Food in the Last Year: Never true    Ran Out of Food in the Last Year: Never true  Transportation Needs: No Transportation Needs (05/07/2022)   PRAPARE - Hydrologist (Medical): No    Lack of Transportation (Non-Medical): No  Physical Activity: Inactive (05/07/2022)   Exercise Vital Sign    Days of Exercise per Week: 0 days    Minutes of Exercise per Session: 0 min  Stress: No Stress Concern Present (05/07/2022)   [place]    Feeling of Stress : Only a little  Social Connections: Moderately Integrated (05/07/2022)   Social Connection and Isolation Panel [NHANES]    Frequency of Communication with Friends and Family: More than three times a week    Frequency of Social Gatherings with  Friends and Family: Once a week    Attends Religious Services: More than 4 times per year    Active Member of [employer] or Organizations: No    Attends Archivist Meetings: Never    Marital Status: Married  Human resources officer Violence: Not At Risk (05/07/2022)   Humiliation, Afraid, Rape, and Kick questionnaire    Fear of Current or Ex-Partner: No    Emotionally Abused: No    Physically Abused: No    Sexually Abused: No    FAMILY HISTORY: Family History  Problem Relation Age of Onset   Osteoporosis Mother    Stroke Mother    Coronary artery disease Father    Stroke Father 34   Diabetes Other        Aunts and uncles   Breast cancer Paternal Aunt    Breast cancer Maternal Aunt    Arthritis/Rheumatoid Child    Breast cancer Cousin     ALLERGIES:  is allergic to codeine.  MEDICATIONS:  Current Outpatient Medications  Medication Sig Dispense Refill   acetaminophen (TYLENOL) 500 MG tablet Take 500-1,000 mg by mouth every 6 (six) hours as needed for mild pain  or headache.     aspirin EC 81 MG tablet Take 81 mg by mouth at bedtime. Swallow whole.      Biotin 10 MG CAPS Take by mouth.     Calcium Carb-Cholecalciferol (CALCIUM-VITAMIN D3) 600-10 MG-MCG CAPS Take by mouth in the morning and at bedtime. Taking twice dailly     Darbepoetin Alfa (ARANESP) 500 MCG/ML SOSY injection Inject 500 mcg into the skin See admin instructions. Inject 500 mcg into the skin every week     ergocalciferol (VITAMIN D2) 1.25 MG (50000 UT) capsule Take 1 capsule (50,000 Units total) by mouth once a week. 12 capsule 0   levothyroxine (SYNTHROID) 25 MCG tablet TAKE 1 TABLET BY MOUTH ONCE A DAY BEFOREBREAKFAST. 90 tablet 3   ondansetron (ZOFRAN ODT) 8 MG disintegrating tablet Take 1 tablet (8 mg total) by mouth every 8 (eight) hours as needed for nausea or vomiting. 30 tablet 0   VITAMIN D PO Take 2,000 Units by mouth daily.     No current facility-administered medications for this visit.       PHYSICAL EXAMINATION:   Vitals:   05/11/22 1109  BP: (!) 106/57  Pulse: 73  Temp: (!) 96.5 F (35.8 C)  SpO2: 99%     Filed Weights   05/11/22 1109  Weight: 145 lb 9.6 oz (66 kg)      Physical Exam HENT:     Head: Normocephalic and atraumatic.     Mouth/Throat:     Pharynx: No oropharyngeal exudate.  Eyes:     Pupils: Pupils are equal, round, and reactive to light.  Cardiovascular:     Rate and Rhythm: Normal rate and regular rhythm.     Pulses: Normal pulses.     Heart sounds: Normal heart sounds.  Pulmonary:     Effort: Pulmonary effort is normal. No respiratory distress.     Breath sounds: Normal breath sounds. No wheezing.  Abdominal:     General: Bowel sounds are normal. There is no distension.     Palpations: Abdomen is soft. There is no mass.     Tenderness: There is no abdominal tenderness. There is no guarding or rebound.  Musculoskeletal:        General: No tenderness. Normal range of motion.     Cervical back: Normal range of motion and neck supple.  Skin:    General: Skin is warm.  Neurological:     Mental Status: She is alert and oriented to person, place, and time.  Psychiatric:        Mood and Affect: Affect normal.     LABORATORY DATA:  I have reviewed the data as listed Lab Results  Component Value Date   WBC 3.4 (L) 05/11/2022   HGB 7.4 (L) 05/11/2022   HCT 22.9 (L) 05/11/2022   MCV 111.7 (H) 05/11/2022   PLT 270 05/11/2022   Recent Labs    03/16/22 1244 04/12/22 1054 04/23/22 0741 05/11/22 1047  NA 139 138 139 138  K 3.8 3.9 3.9 4.0  CL 106 107 106 105  CO2 27 26 27 26   GLUCOSE 98 145* 88 136*  BUN 14 14 17 22   CREATININE 1.02* 0.92 0.92 0.96  CALCIUM 8.6* 8.7* 8.8 9.0  GFRNONAA 59* >60  --  >60  PROT 7.4 7.7 7.1 7.9  ALBUMIN 4.2 4.4 4.2 4.3  AST 17 21 14 20   ALT 13 13 11 16   ALKPHOS 42 46 50 53  BILITOT 1.5* 1.0 0.9 1.2  No results found.  MDS (myelodysplastic syndrome), low grade (HCC) # Low grade  MDS- with ringed sideroblasts; no blasts. POSITIVE  For SF3B1; R-IPSS-low risk. . Currently on Aranesp 500 mcg; subcu injections; and  lenalidomide 10 mg  3w-ON & 1 w-OFF-tolerating fairly well except for mild GI effects/mild neutropenia. MARCH 2023-bone marrow biopsy no evidence of obvious progression of MDS or any acute process.  Discontinue Revlimid-July 2023.  #Continue Aranesp 100 mcg every 2-3 weeks.  Proceed with Luspatercept every 3 weeks.  Again discussed the potential mechanism of action and side effects including fatigue from Luspatercept.  Hemoglobin today is 7.4.-Discussed with patient regarding blood transfusion.  We will hold off for now.  # Diarrhea/dyspepsis-G-1-2; sec to revlimid- monitor for now; discussed regarding use of cholestyramine- /decrease fat intake. controlled with immodium prn. STABLE.   # Palpitations: Normal rate and rhythm- s/p evaluation with Dr.Gollan s/p monitor-STABLE.    # Right shoulder pain/radiating to Right UE; ortho [Chris gaines];  MRI shoulder- continue ice/ heat for . -STABLE; Continue prn NSAIDs.   # DISPOSITION:  # proceed with Aranesp and Luspatecept  Today;  # follow up in 3 weeks--MD;  labs CBC/cmp;:LDH; HOLD tube;  possible aranesp; Luspatercept  SQ; D-2 possible 1 unit of PRBC- dr.B  All questions were answered. The patient knows to call the clinic with any problems, questions or concerns.   Carla Sickle, MD 05/11/2022 12:33 PM

## 2022-05-12 ENCOUNTER — Ambulatory Visit: Payer: PPO | Admitting: Internal Medicine

## 2022-05-12 ENCOUNTER — Other Ambulatory Visit: Payer: Self-pay

## 2022-05-12 ENCOUNTER — Ambulatory Visit: Payer: PPO

## 2022-05-12 ENCOUNTER — Other Ambulatory Visit: Payer: PPO

## 2022-05-13 ENCOUNTER — Other Ambulatory Visit: Payer: Self-pay

## 2022-05-20 ENCOUNTER — Ambulatory Visit (INDEPENDENT_AMBULATORY_CARE_PROVIDER_SITE_OTHER): Payer: PPO

## 2022-05-20 VITALS — Wt 145.0 lb

## 2022-05-20 DIAGNOSIS — Z0000 Encounter for general adult medical examination without abnormal findings: Secondary | ICD-10-CM | POA: Diagnosis not present

## 2022-05-20 DIAGNOSIS — Z1231 Encounter for screening mammogram for malignant neoplasm of breast: Secondary | ICD-10-CM | POA: Diagnosis not present

## 2022-05-20 NOTE — Patient Instructions (Signed)
[name] , Thank you for taking time to come for your Medicare Wellness Visit. I appreciate your ongoing commitment to your health goals. Please review the following plan we discussed and let me know if I can assist you in the future.   Screening recommendations/referrals: Colonoscopy: 07/31/15 Mammogram: 05/29/21, referral sent Bone Density: 01/31/18 Recommended yearly ophthalmology/optometry visit for glaucoma screening and checkup Recommended yearly dental visit for hygiene and checkup  Vaccinations: Influenza vaccine: 07/07/21 Pneumococcal vaccine: 12/14/16 Tdap vaccine: 06/22/11 Shingles vaccine: Zostavax 07/05/12  Shingrix 07/31/19, 12/19/19   Covid-19:11/03/19, 11/24/19  Advanced directives: yes  Conditions/risks identified: none  Next appointment: Follow up in one year for your annual wellness visit - 05/23/23 @ 9:30 am by phone   Preventive Care 65 Years and Older, Female Preventive care refers to lifestyle choices and visits with your health care provider that can promote health and wellness. What does preventive care include? A yearly physical exam. This is also called an annual well check. Dental exams once or twice a year. Routine eye exams. Ask your health care provider how often you should have your eyes checked. Personal lifestyle choices, including: Daily care of your teeth and gums. Regular physical activity. Eating a healthy diet. Avoiding tobacco and drug use. Limiting alcohol use. Practicing safe sex. Taking low-dose aspirin every day. Taking vitamin and mineral supplements as recommended by your health care provider. What happens during an annual well check? The services and screenings done by your health care provider during your annual well check will depend on your age, overall health, lifestyle risk factors, and family history of disease. Counseling  Your health care provider [place] ask you questions about your: Alcohol use. Tobacco use. Drug use. Emotional  well-being. Home and relationship well-being. Sexual activity. Eating habits. History of falls. Memory and ability to understand (cognition). Work and work Statistician. Reproductive health. Screening  You [place]att have the following tests or measurements: Height, weight, and BMI. Blood pressure. Lipid and cholesterol levels. These Noteboom be checked every 5 years, or more frequently if you are over 74 years old. Skin check. Lung cancer screening. You [place] have this screening every year starting at age 61 if you have a 30-pack-year history of smoking and currently smoke or have quit within the past 15 years. Fecal occult blood test (FOBT) of the stool. You Sorbo have this test every year starting at age 85. Flexible sigmoidoscopy or colonoscopy. You [place] have a sigmoidoscopy every 5 years or a colonoscopy every 10 years starting at age 66. Hepatitis C blood test. Hepatitis B blood test. Sexually transmitted disease (STD) testing. Diabetes screening. This is done by checking your blood sugar (glucose) after you have not eaten for a while (fasting). You Ulrey have this done every 1-3 years. Bone density scan. This is done to screen for osteoporosis. You [place] have this done starting at age 23. Mammogram. This [place] be done every 1-2 years. Talk to your health care provider about how often you should have regular mammograms. Talk with your health care provider about your test results, treatment options, and if necessary, the need for more tests. Vaccines  Your health care provider [place] recommend certain vaccines, such as: Influenza vaccine. This is recommended every year. Tetanus, diphtheria, and acellular pertussis (Tdap, Td) vaccine. You Anthis need a Td booster every 10 years. Zoster vaccine. You Hymes need this after age 23. Pneumococcal 13-valent conjugate (PCV13) vaccine. One dose is recommended after age 31. Pneumococcal polysaccharide (PPSV23) vaccine. One dose is recommended after  age 2. Talk to your  health care provider about which screenings and vaccines you need and how often you need them. This information is not intended to replace advice given to you by your health care provider. Make sure you discuss any questions you have with your health care provider. Document Released: 10/10/2015 Document Revised: 06/02/2016 Document Reviewed: 07/15/2015 Elsevier Interactive Patient Education  2017 [place] Prevention in the Home Falls can cause injuries. They can happen to people of all ages. There are many things you can do to make your home safe and to help prevent falls. What can I do on the outside of my home? Regularly fix the edges of walkways and driveways and fix any cracks. Remove anything that might make you trip as you walk through a door, such as a raised step or threshold. Trim any bushes or trees on the path to your home. Use bright outdoor lighting. Clear any walking paths of anything that might make someone trip, such as rocks or tools. Regularly check to see if handrails are loose or broken. Make sure that both sides of any steps have handrails. Any raised decks and porches should have guardrails on the edges. Have any leaves, snow, or ice cleared regularly. Use sand or salt on walking paths during winter. Clean up any spills in your garage right away. This includes oil or grease spills. What can I do in the bathroom? Use night lights. Install grab bars by the toilet and in the tub and shower. Do not use towel bars as grab bars. Use non-skid mats or decals in the tub or shower. If you need to sit down in the shower, use a plastic, non-slip stool. Keep the floor dry. Clean up any water that spills on the floor as soon as it happens. Remove soap buildup in the tub or shower regularly. Attach bath mats securely with double-sided non-slip rug tape. Do not have throw rugs and other things on the floor that can make you trip. What can I do in the bedroom? Use night  lights. Make sure that you have a light by your bed that is easy to reach. Do not use any sheets or blankets that are too big for your bed. They should not hang down onto the floor. Have a firm chair that has side arms. You can use this for support while you get dressed. Do not have throw rugs and other things on the floor that can make you trip. What can I do in the kitchen? Clean up any spills right away. Avoid walking on wet floors. Keep items that you use a lot in easy-to-reach places. If you need to reach something above you, use a strong step stool that has a grab bar. Keep electrical cords out of the way. Do not use floor polish or wax that makes floors slippery. If you must use wax, use non-skid floor wax. Do not have throw rugs and other things on the floor that can make you trip. What can I do with my stairs? Do not leave any items on the stairs. Make sure that there are handrails on both sides of the stairs and use them. Fix handrails that are broken or loose. Make sure that handrails are as long as the stairways. Check any carpeting to make sure that it is firmly attached to the stairs. Fix any carpet that is loose or worn. Avoid having throw rugs at the top or bottom of the stairs. If you do  have throw rugs, attach them to the floor with carpet tape. Make sure that you have a light switch at the top of the stairs and the bottom of the stairs. If you do not have them, ask someone to add them for you. What else can I do to help prevent falls? Wear shoes that: Do not have high heels. Have rubber bottoms. Are comfortable and fit you well. Are closed at the toe. Do not wear sandals. If you use a stepladder: Make sure that it is fully opened. Do not climb a closed stepladder. Make sure that both sides of the stepladder are locked into place. Ask someone to hold it for you, if possible. Clearly mark and make sure that you can see: Any grab bars or handrails. First and last  steps. Where the edge of each step is. Use tools that help you move around (mobility aids) if they are needed. These include: Canes. Walkers. Scooters. Crutches. Turn on the lights when you go into a dark area. Replace any light bulbs as soon as they burn out. Set up your furniture so you have a clear path. Avoid moving your furniture around. If any of your floors are uneven, fix them. If there are any pets around you, be aware of where they are. Review your medicines with your doctor. Some medicines can make you feel dizzy. This can increase your chance of falling. Ask your doctor what other things that you can do to help prevent falls. This information is not intended to replace advice given to you by your health care provider. Make sure you discuss any questions you have with your health care provider. Document Released: 07/10/2009 Document Revised: 02/19/2016 Document Reviewed: 10/18/2014 Elsevier Interactive Patient Education  2017 [employer].

## 2022-05-20 NOTE — Addendum Note (Signed)
Addended by: Dionisio David on: 05/20/2022 09:54 AM   Modules accepted: Orders

## 2022-05-20 NOTE — Progress Notes (Signed)
Virtual Visit via Telephone Note  I connected with  Carla [name] on 05/20/22 at  9:30 AM EDT by telephone and verified that I am speaking with the correct person using two identifiers.  Location: Patient: home Provider: [place] Persons participating in the virtual visit: [place]   I discussed the limitations, risks, security and privacy concerns of performing an evaluation and management service by telephone and the availability of in person appointments. The patient expressed understanding and agreed to proceed.  Interactive audio and video telecommunications were attempted between this nurse and patient, however failed, due to patient having technical difficulties OR patient did not have access to video capability.  We continued and completed visit with audio only.  Some vital signs Us[place] be absent or patient reported.   Dionisio David, LPN  Subjective:   Carla [name] is a 71 y.o. female who presents for Medicare Annual (Subsequent) preventive examination.  Review of Systems     Cardiac Risk Factors include: advanced age (>91mn, >>10women)     Objective:    Today's Vitals   05/20/22 0941  Weight: 145 lb (65.8 kg)   Body mass index is 25.22 kg/m.     05/20/2022    9:32 AM 05/11/2022   11:08 AM 04/12/2022   11:27 AM 03/16/2022    1:06 PM 02/16/2022    9:17 AM 01/19/2022   10:49 AM 12/22/2021    9:18 AM  Advanced Directives  Does Patient Have a Medical Advance Directive? Yes Yes Yes Yes Yes Yes Yes  Type of [place]of [place] will [place] will Healthcare Power of [place] Does patient want to make changes to medical advance directive? Yes (Inpatient - patient defers changing a medical advance directive and declines information at this time)   Yes (ED - Information included in AVS)     Copy of  HMount Ayrin Chart? Yes - validated most recent copy scanned in chart (See row information)          Current Medications (verified) Outpatient Encounter Medications as of 05/20/2022  Medication Sig   acetaminophen (TYLENOL) 500 MG tablet Take 500-1,000 mg by mouth every 6 (six) hours as needed for mild pain or headache.   aspirin EC 81 MG tablet Take 81 mg by mouth at bedtime. Swallow whole.    Biotin 10 MG CAPS Take by mouth.   Calcium Carb-Cholecalciferol (CALCIUM-VITAMIN D3) 600-10 MG-MCG CAPS Take by mouth in the morning and at bedtime. Taking twice dailly   Darbepoetin Alfa (ARANESP) 500 MCG/ML SOSY injection Inject 500 mcg into the skin See admin instructions. Inject 500 mcg into the skin every week   ergocalciferol (VITAMIN D2) 1.25 MG (50000 UT) capsule Take 1 capsule (50,000 Units total) by mouth once a week.   levothyroxine (SYNTHROID) 25 MCG tablet TAKE 1 TABLET BY MOUTH ONCE A DAY BEFOREBREAKFAST.   ondansetron (ZOFRAN ODT) 8 MG disintegrating tablet Take 1 tablet (8 mg total) by mouth every 8 (eight) hours as needed for nausea or vomiting.   VITAMIN D PO Take 2,000 Units by mouth daily.   No facility-administered encounter medications on file as of 05/20/2022.    Allergies (verified) Codeine   History: Past Medical History:  Diagnosis Date   Allergic rhinitis, cause unspecified    Anemia, unspecified    Carpal tunnel syndrome    Cystitis,  unspecified    Family history of osteoporosis    PONV (postoperative nausea and vomiting)    Postnasal drip    Syncope and collapse    Past Surgical History:  Procedure Laterality Date   BONE MARROW BIOPSY     CARPAL TUNNEL RELEASE     COLONOSCOPY  09/28/2003   COLONOSCOPY WITH PROPOFOL N/A 07/31/2015   Procedure: COLONOSCOPY WITH PROPOFOL;  Surgeon: Ronald Lobo, MD;  Location: [facility];  Service: Endoscopy;  Laterality: N/A;   DIAGNOSTIC LAPAROSCOPY     dx lack of pregnancy    EYE SURGERY     lasik,  cataract, prk,yag procedure   Family History  Problem Relation Age of Onset   Osteoporosis Mother    Stroke Mother    Coronary artery disease Father    Stroke Father 32   Diabetes Other        Aunts and uncles   Breast cancer Paternal Aunt    Breast cancer Maternal Aunt    Arthritis/Rheumatoid Child    Breast cancer Cousin    Social History   Socioeconomic History   Marital status: Married    Spouse name: Not on file   Number of children: Not on file   Years of education: Not on file   Highest education level: Not on file  Occupational History   Not on file  Tobacco Use   Smoking status: Never   Smokeless tobacco: Never  Vaping Use   Vaping Use: Never used  Substance and Sexual Activity   Alcohol use: Yes    Alcohol/week: 0.0 standard drinks of alcohol    Comment: Rare   Drug use: No   Sexual activity: Not Currently  Other Topics Concern   Not on file  Social History Narrative   No regular exercise      Drinks lots of [employer]         Social Determinants of Health   Financial Resource Strain: Low Risk  (05/20/2022)   Overall Financial Resource Strain (CARDIA)    Difficulty of Paying Living Expenses: Not very hard  Food Insecurity: No Food Insecurity (05/20/2022)   Hunger Vital Sign    Worried About Running Out of Food in the Last Year: Never true    Ran Out of Food in the Last Year: Never true  Transportation Needs: No Transportation Needs (05/20/2022)   PRAPARE - Hydrologist (Medical): No    Lack of Transportation (Non-Medical): No  Physical Activity: Inactive (05/20/2022)   Exercise Vital Sign    Days of Exercise per Week: 0 days    Minutes of Exercise per Session: 0 min  Stress: No Stress Concern Present (05/20/2022)   [place]    Feeling of Stress : Only a little  Social Connections: Moderately Integrated (05/20/2022)   Social Connection and Isolation Panel  [NHANES]    Frequency of Communication with Friends and Family: More than three times a week    Frequency of Social Gatherings with Friends and Family: Once a week    Attends Religious Services: More than 4 times per year    Active Member of [employer] or Organizations: No    Attends Archivist Meetings: Never    Marital Status: Married    Tobacco Counseling Counseling given: Not Answered   Clinical Intake:  Pre-visit preparation completed: Yes  Pain : No/denies pain     Nutritional Risks: None Diabetes: No  How often  do you need to have someone help you when you read instructions, pamphlets, or other written materials from your doctor or pharmacy?: 1 - Never  Diabetic?no  Interpreter Needed?: No  Information entered by :: Kirke Shaggy, LPN   Activities of Daily Living    05/20/2022    9:32 AM 11/27/2021    7:55 AM  In your present state of health, do you have any difficulty performing the following activities:  Hearing? 0 0  Vision? 0 0  Difficulty concentrating or making decisions? 0 0  Walking or climbing stairs? 1 0  Dressing or bathing? 0 0  Doing errands, shopping? 0   Preparing Food and eating ? N   Using the Toilet? N   In the past six months, have you accidently leaked urine? N   Do you have problems with loss of bowel control? N   Managing your Medications? N   Managing your Finances? N   Housekeeping or managing your Housekeeping? N     Patient Care Team: [name], [name], MD as PCP - General Rogue Bussing [name], MD as Consulting Physician (Hematology and Oncology)  Indicate any recent Medical Services you [place] have received from other than [facility] providers in the past year (date Z[place] be approximate).     Assessment:   This is a routine wellness examination for [place].  Hearing/Vision screen Hearing Screening - Comments:: No aids Vision Screening - Comments:: Readers- [facility]  Dietary issues and exercise activities discussed: Current  Exercise Habits: The patient does not participate in regular exercise at present   Goals Addressed             This Visit's Progress    DIET - EAT MORE FRUITS AND VEGETABLES         Depression Screen    05/20/2022    9:30 AM 05/07/2022   10:59 AM 04/29/2022   11:15 AM 04/27/2021   10:33 AM 03/06/2020    2:53 PM 03/02/2019    1:08 PM 12/20/2017    9:17 AM  PHQ 2/9 Scores  PHQ - 2 Score 0 0 0 0 0 0 0  PHQ- 9 Score 0    0 0 0    Fall Risk    05/20/2022    9:32 AM 04/29/2022   11:15 AM 04/27/2021   10:33 AM 03/06/2020    2:53 PM 03/02/2019    1:08 PM  Fall Risk   Falls in the past year? 0 0 0 0 0  Number falls in past yr: 0   0   Injury with Fall? 0   0   Risk for fall due to : No Fall Risks   No Fall Risks   Follow up Falls evaluation completed Falls evaluation completed  Falls evaluation completed;Falls prevention discussed     FALL RISK PREVENTION PERTAINING TO THE HOME:  Any stairs in or around the home? Yes  If so, are there any without handrails? No  Home free of loose throw rugs in walkways, pet beds, electrical cords, etc? Yes  Adequate lighting in your home to reduce risk of falls? Yes   ASSISTIVE DEVICES UTILIZED TO PREVENT FALLS:  Life alert? No  Use of a cane, walker or w/c? No  Grab bars in the bathroom? No  Shower chair or bench in shower? Yes  Elevated toilet seat or a handicapped toilet? Yes   Cognitive Function:    03/06/2020    2:55 PM 03/02/2019   12:30 PM 12/20/2017  9:17 AM  MMSE - Mini Mental State Exam  Orientation to time 5 5 5   Orientation to Place 5 5 5   Registration 3 3 3   Attention/ Calculation 5 0 0  Recall 3 3 3   Language- name 2 objects  0 0  Language- repeat 1 1 1   Language- follow 3 step command  0 3  Language- read & follow direction  0 0  Write a sentence  0 0  Copy design  0 0  Total score  17 20        05/20/2022    9:34 AM  6CIT Screen  What Year? 0 points  What month? 0 points  What time? 0 points  Count back from 20  0 points  Months in reverse 0 points  Repeat phrase 0 points  Total Score 0 points    Immunizations Immunization History  Administered Date(s) Administered   Fluad Quad(high Dose 65+) 07/05/2019, 06/24/2020, 07/07/2021   Influenza Split 06/22/2011   Influenza Whole 06/26/2008, 07/15/2009, 08/17/2012   Influenza, High Dose Seasonal PF 07/13/2015, 06/27/2017   Influenza,inj,Quad PF,6+ Mos 07/18/2013   Influenza-Unspecified 07/11/2014, 07/09/2016   PFIZER(Purple Top)SARS-COV-2 Vaccination 11/03/2019, 11/24/2019   Pneumococcal Conjugate-13 10/10/2015   Pneumococcal Polysaccharide-23 12/14/2016   Tdap 06/22/2011   Zoster Recombinat (Shingrix) 07/31/2019, 12/19/2019   Zoster, Live 07/05/2012    TDAP status: Due, Education has been provided regarding the importance of this vaccine. Advised Sze receive this vaccine at local pharmacy or Health Dept. Aware to provide a copy of the vaccination record if obtained from local pharmacy or Health Dept. Verbalized acceptance and understanding.  Flu Vaccine status: Up to date  Pneumococcal vaccine status: Up to date  Covid-19 vaccine status: Completed vaccines  Qualifies for Shingles Vaccine? Yes   Zostavax completed Yes   Shingrix Completed?: Yes  Screening Tests Health Maintenance  Topic Date Due   COVID-19 Vaccine (3 - Pfizer risk series) 12/22/2019   INFLUENZA VACCINE  04/27/2022   TETANUS/TDAP  04/30/2027 (Originally 06/21/2021)   MAMMOGRAM  05/29/2022   COLONOSCOPY (Pts 45-38yr Insurance coverage will need to be confirmed)  07/30/2025   Pneumonia Vaccine 72 Years old  Completed   DEXA SCAN  Completed   Hepatitis C Screening  Completed   Zoster Vaccines- Shingrix  Completed   HPV VACCINES  Aged Out    Health Maintenance  Health Maintenance Due  Topic Date Due   COVID-19 Vaccine (3 - Pfizer risk series) 12/22/2019   INFLUENZA VACCINE  04/27/2022    Colorectal cancer screening: Type of screening: Colonoscopy. Completed  07/31/15. Repeat every 10 years  Mammogram status: Completed 05/29/21. Repeat every year- referral sent  Bone Density status: Completed 01/31/18. Results reflect: Bone density results: NORMAL. Repeat every 5 years.  Lung Cancer Screening: (Low Dose CT Chest recommended if Age 72-80years, 30 pack-year currently smoking OR have quit w/in 15years.) does not qualify.   Lung Cancer Screening Referral: pt has lung cancer  Additional Screening:  Hepatitis C Screening: does qualify; Completed 12/14/16  Vision Screening: Recommended annual ophthalmology exams for early detection of glaucoma and other disorders of the eye. Is the patient up to date with their annual eye exam?  Yes  Who is the provider or what is the name of the office in which the patient attends annual eye exams? [facility] is not established with a provider, would they like to be referred to a provider to establish care? No .   Dental Screening: Recommended annual  dental exams for proper oral hygiene  Community Resource Referral / Chronic Care Management: CRR required this visit?  No   CCM required this visit?  No      Plan:     I have personally reviewed and noted the following in the patient's chart:   Medical and social history Use of alcohol, tobacco or illicit drugs  Current medications and supplements including opioid prescriptions. Patient is not currently taking opioid prescriptions. Functional ability and status Nutritional status Physical activity Advanced directives List of other physicians Hospitalizations, surgeries, and ER visits in previous 12 months Vitals Screenings to include cognitive, depression, and falls Referrals and appointments  In addition, I have reviewed and discussed with patient certain preventive protocols, quality metrics, and best practice recommendations. A written personalized care plan for preventive services as well as general preventive health recommendations were  provided to patient.     Dionisio David, LPN   [id number]/36/6815   Nurse Notes: none

## 2022-05-21 ENCOUNTER — Other Ambulatory Visit: Payer: Self-pay

## 2022-06-01 ENCOUNTER — Inpatient Hospital Stay: Payer: PPO | Attending: Internal Medicine | Admitting: Internal Medicine

## 2022-06-01 ENCOUNTER — Encounter: Payer: Self-pay | Admitting: Internal Medicine

## 2022-06-01 ENCOUNTER — Inpatient Hospital Stay: Payer: PPO

## 2022-06-01 DIAGNOSIS — D46Z Other myelodysplastic syndromes: Secondary | ICD-10-CM

## 2022-06-01 DIAGNOSIS — D461 Refractory anemia with ring sideroblasts: Secondary | ICD-10-CM | POA: Insufficient documentation

## 2022-06-01 LAB — SAMPLE TO BLOOD BANK

## 2022-06-01 LAB — CBC WITH DIFFERENTIAL/PLATELET
Abs Immature Granulocytes: 0.09 10*3/uL — ABNORMAL HIGH (ref 0.00–0.07)
Basophils Absolute: 0.1 10*3/uL (ref 0.0–0.1)
Basophils Relative: 1 %
Eosinophils Absolute: 0.1 10*3/uL (ref 0.0–0.5)
Eosinophils Relative: 2 %
HCT: 27.6 % — ABNORMAL LOW (ref 36.0–46.0)
Hemoglobin: 8.8 g/dL — ABNORMAL LOW (ref 12.0–15.0)
Immature Granulocytes: 2 %
Lymphocytes Relative: 34 %
Lymphs Abs: 1.7 10*3/uL (ref 0.7–4.0)
MCH: 35.2 pg — ABNORMAL HIGH (ref 26.0–34.0)
MCHC: 31.9 g/dL (ref 30.0–36.0)
MCV: 110.4 fL — ABNORMAL HIGH (ref 80.0–100.0)
Monocytes Absolute: 0.3 10*3/uL (ref 0.1–1.0)
Monocytes Relative: 7 %
Neutro Abs: 2.7 10*3/uL (ref 1.7–7.7)
Neutrophils Relative %: 54 %
Platelets: 310 10*3/uL (ref 150–400)
RBC: 2.5 MIL/uL — ABNORMAL LOW (ref 3.87–5.11)
RDW: 25.9 % — ABNORMAL HIGH (ref 11.5–15.5)
WBC: 5 10*3/uL (ref 4.0–10.5)
nRBC: 1.8 % — ABNORMAL HIGH (ref 0.0–0.2)

## 2022-06-01 MED ORDER — LUSPATERCEPT-AAMT 75 MG ~~LOC~~ SOLR
1.0000 mg/kg | Freq: Once | SUBCUTANEOUS | Status: AC
  Administered 2022-06-01: 65 mg via SUBCUTANEOUS
  Filled 2022-06-01: qty 1.3

## 2022-06-01 MED ORDER — DARBEPOETIN ALFA 500 MCG/ML IJ SOSY
500.0000 ug | PREFILLED_SYRINGE | Freq: Once | INTRAMUSCULAR | Status: AC
  Administered 2022-06-01: 500 ug via SUBCUTANEOUS
  Filled 2022-06-01: qty 1

## 2022-06-01 NOTE — Addendum Note (Signed)
Addended by: Leeann Must on: 06/01/2022 03:45 PM   Modules accepted: Orders

## 2022-06-01 NOTE — Assessment & Plan Note (Addendum)
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 MARCH 2023-bone marrow biopsy no evidence of obvious progression of MDS or any acute process.  Currently on MetaNebs plus Luspatercept  # Continue Aranesp 100 mcg every 2-3 weeks. Continue with Luspatercept #2 today. .Hemoglobin today is  8.8.  # Right shoulder pain/radiating to Right UE; ortho [Chris gaines];  MRI shoulder- continue ice/ heat for . -STABLE; Continue prn NSAIDs.   # DISPOSITION:  # proceed with Aranesp and Luspatecept  Today;  # cancel blood tomorrow.   # follow up in 3 weeks--MD;  labs CBC/cmp;:LDH; HOLD tube;  possible aranesp; Luspatercept  SQ; D-2 possible 1 unit of PRBC- dr.B

## 2022-06-01 NOTE — Progress Notes (Signed)
[place] CONSULT NOTE  Patient Care Team: [name], Carla Fanny, MD as PCP - General Rogue Bussing, [name], MD as Consulting Physician (Hematology and Oncology)  CHIEF COMPLAINTS/PURPOSE OF CONSULTATION: MDS   Oncology History Overview Note   # July 2020-myelodysplastic syndrome with ringed sideroblasts-Normal karyotype; no blasts [IPSS R-Very low risk ~median survival 8.3 years]; iron studies V61 folic acid myeloma panel normal; pyridoxine levels/copper/zinc-WNL. Erythropoietin levels-60. 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   05/11/2022 - 05/11/2022 Chemotherapy   Patient is on Treatment Plan : MYELODYSPLASIA Luspatercept q21d     05/11/2022 -  Chemotherapy   Patient is on Treatment Plan : MYELODYSPLASIA Luspatercept q21d       HISTORY OF PRESENTING ILLNESS: Accompanied by husband ambulating independently.  LNance MccombsMay 71 y.o.  female history of low-grade MDS  anemia with ring sideroblasts currently on Aranesp/Luspatercept is here for follow-up.    Noted to have nasal stuffiness; postnasal discharge. Otherwise denies no fever no chills.  No cough.   Patient continues to have right shoulder pain; rotator cuff tear-however not any worse.    No nausea no vomiting.  Denies any diarrhea.  Review of Systems  Constitutional:  Positive for malaise/fatigue. Negative for chills, diaphoresis and fever.  HENT:  Negative for nosebleeds and sore throat.   Eyes:  Negative for double vision.  Respiratory:  Negative for cough, hemoptysis, sputum production, shortness of breath and wheezing.   Cardiovascular:  Negative for chest pain and orthopnea.  Gastrointestinal:  Negative for abdominal pain, blood in stool, constipation, diarrhea, heartburn, melena and vomiting.  Genitourinary:  Negative for dysuria, frequency and urgency.  Musculoskeletal:  Positive for back pain and joint pain.  Skin: Negative.  Negative for itching and rash.  Neurological:  Negative for dizziness, tingling, focal weakness, weakness and headaches.  Endo/Heme/Allergies:  Does not bruise/bleed easily.  Psychiatric/Behavioral:  Negative for depression. The patient is not nervous/anxious and does not have insomnia.     MEDICAL HISTORY:  Past Medical History:  Diagnosis Date   Allergic rhinitis, cause unspecified    Anemia, unspecified    Carpal tunnel syndrome    Cystitis, unspecified    Family history of osteoporosis    PONV (postoperative nausea and vomiting)    Postnasal drip    Syncope and collapse     SURGICAL HISTORY: Past Surgical History:  Procedure Laterality Date   BONE MARROW BIOPSY     CARPAL TUNNEL RELEASE     COLONOSCOPY  09/28/2003   COLONOSCOPY WITH PROPOFOL N/A 07/31/2015   Procedure: COLONOSCOPY WITH PROPOFOL;  Surgeon: RRonald Lobo  MD;  Location: [facility];  Service: Endoscopy;  Laterality: N/A;   DIAGNOSTIC LAPAROSCOPY     dx lack of pregnancy     EYE SURGERY     lasik, cataract, prk,yag procedure    SOCIAL HISTORY: Social History   Socioeconomic History   Marital status: Married    Spouse name: Not on file   Number of children: Not on file   Years of education: Not on file   Highest education level: Not on file  Occupational History   Not on file  Tobacco Use   Smoking status: Never   Smokeless tobacco: Never  Vaping Use   Vaping Use: Never used  Substance and Sexual Activity   Alcohol use: Yes    Alcohol/week: 0.0 standard drinks of alcohol    Comment: Rare   Drug use: No   Sexual activity: Not Currently  Other Topics Concern   Not on file  Social History Narrative   No regular exercise      Drinks lots of [employer]         Social Determinants of Health   Financial Resource Strain: Low Risk  (05/20/2022)   Overall Financial Resource Strain (CARDIA)    Difficulty of Paying Living Expenses: Not very hard  Food Insecurity: No Food Insecurity (05/20/2022)   Hunger Vital Sign    Worried About Running Out of Food in the Last Year: Never true    Ran Out of Food in the Last Year: Never true  Transportation Needs: No Transportation Needs (05/20/2022)   PRAPARE - Hydrologist (Medical): No    Lack of Transportation (Non-Medical): No  Physical Activity: Inactive (05/20/2022)   Exercise Vital Sign    Days of Exercise per Week: 0 days    Minutes of Exercise per Session: 0 min  Stress: No Stress Concern Present (05/20/2022)   [place]    Feeling of Stress : Only a little  Social Connections: Moderately Integrated (05/20/2022)   Social Connection and Isolation Panel [NHANES]    Frequency of Communication with Friends and Family: More than three times a week    Frequency of Social Gatherings with Friends and Family: Once a week    Attends Religious Services: More than 4 times per year    Active Member of [employer] or Organizations:  No    Attends Archivist Meetings: Never    Marital Status: Married  Human resources officer Violence: Not At Risk (05/20/2022)   Humiliation, Afraid, Rape, and Kick questionnaire    Fear of Current or Ex-Partner: No    Emotionally Abused: No    Physically Abused: No    Sexually Abused: No    FAMILY HISTORY: Family History  Problem Relation Age of Onset   Osteoporosis Mother    Stroke Mother    Coronary artery disease Father    Stroke Father 7   Diabetes Other        Aunts and uncles   Breast cancer Paternal Aunt    Breast cancer Maternal Aunt    Arthritis/Rheumatoid Child    Breast cancer Cousin     ALLERGIES:  is allergic to codeine.  MEDICATIONS:  Current Outpatient Medications  Medication Sig Dispense Refill   acetaminophen (TYLENOL) 500 MG tablet Take 500-1,000 mg by mouth every 6 (six) hours as needed for mild pain or headache.     aspirin EC 81 MG tablet Take 81 mg by mouth at bedtime.  Swallow whole.      Biotin 10 MG CAPS Take by mouth.     Calcium Carb-Cholecalciferol (CALCIUM-VITAMIN D3) 600-10 MG-MCG CAPS Take by mouth in the morning and at bedtime. Taking twice dailly     Darbepoetin Alfa (ARANESP) 500 MCG/ML SOSY injection Inject 500 mcg into the skin See admin instructions. Inject 500 mcg into the skin every week     ergocalciferol (VITAMIN D2) 1.25 MG (50000 UT) capsule Take 1 capsule (50,000 Units total) by mouth once a week. 12 capsule 0   levothyroxine (SYNTHROID) 25 MCG tablet TAKE 1 TABLET BY MOUTH ONCE A DAY BEFOREBREAKFAST. 90 tablet 3   ondansetron (ZOFRAN ODT) 8 MG disintegrating tablet Take 1 tablet (8 mg total) by mouth every 8 (eight) hours as needed for nausea or vomiting. 30 tablet 0   VITAMIN D PO Take 2,000 Units by mouth daily.     No current facility-administered medications for this visit.   Facility-Administered Medications Ordered in Other Visits  Medication Dose Route Frequency Provider Last Rate Last Admin   Darbepoetin Alfa  (ARANESP) injection 500 mcg  500 mcg Subcutaneous Once Ashanti Littles R, MD       luspatercept-aamt (REBLOZYL) subcutaneous injection 65 mg  1 mg/kg (Treatment Plan Recorded) Subcutaneous Once Cammie Sickle, MD          PHYSICAL EXAMINATION:   Vitals:   06/01/22 1426  BP: 117/67  Pulse: 76  Temp: 98.5 F (36.9 C)  SpO2: 99%     Filed Weights   06/01/22 1426  Weight: 145 lb 6.4 oz (66 kg)      Physical Exam HENT:     Head: Normocephalic and atraumatic.     Mouth/Throat:     Pharynx: No oropharyngeal exudate.  Eyes:     Pupils: Pupils are equal, round, and reactive to light.  Cardiovascular:     Rate and Rhythm: Normal rate and regular rhythm.     Pulses: Normal pulses.     Heart sounds: Normal heart sounds.  Pulmonary:     Effort: Pulmonary effort is normal. No respiratory distress.     Breath sounds: Normal breath sounds. No wheezing.  Abdominal:     General: Bowel sounds are normal. There is no distension.     Palpations: Abdomen is soft. There is no mass.     Tenderness: There is no abdominal tenderness. There is no guarding or rebound.  Musculoskeletal:        General: No tenderness. Normal range of motion.     Cervical back: Normal range of motion and neck supple.  Skin:    General: Skin is warm.  Neurological:     Mental Status: She is alert and oriented to person, place, and time.  Psychiatric:        Mood and Affect: Affect normal.     LABORATORY DATA:  I have reviewed the data as listed Lab Results  Component Value Date   WBC 5.0 06/01/2022   HGB 8.8 (L) 06/01/2022   HCT 27.6 (L) 06/01/2022   MCV 110.4 (H) 06/01/2022   PLT 310 06/01/2022   Recent Labs    03/16/22 1244 04/12/22 1054 04/23/22 0741 05/11/22 1047  NA 139 138 139 138  K 3.8 3.9 3.9 4.0  CL 106 107 106 105  CO2 27 26 27 26   GLUCOSE 98 145* 88 136*  BUN 14 14 17 22   CREATININE 1.02* 0.92 0.92 0.96  CALCIUM 8.6* 8.7* 8.8 9.0  GFRNONAA 59* >60  --  >  60  PROT  7.4 7.7 7.1 7.9  ALBUMIN 4.2 4.4 4.2 4.3  AST 17 21 14 20   ALT 13 13 11 16   ALKPHOS 42 46 50 53  BILITOT 1.5* 1.0 0.9 1.2     No results found.  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 POSITIVE  For SF3B1; R-IPSS-low risk. MARCH 2023-bone marrow biopsy no evidence of obvious progression of MDS or any acute process.  Currently on MetaNebs plus Luspatercept  # Continue Aranesp 100 mcg every 2-3 weeks. Continue with Luspatercept #2 today. .Hemoglobin today is  8.8.  # Palpitations: Normal rate and rhythm- s/p evaluation with Dr.Gollan s/p monitor-STABLE.    # Right shoulder pain/radiating to Right UE; ortho [Chris gaines];  MRI shoulder- continue ice/ heat for . -STABLE; Continue prn NSAIDs.   # DISPOSITION:  # proceed with Aranesp and Luspatecept  Today;  # cancel blood tomorrow.   # follow up in 3 weeks--MD;  labs CBC/cmp;:LDH; HOLD tube;  possible aranesp; Luspatercept  SQ; D-2 possible 1 unit of PRBC- dr.B  All questions were answered. The patient knows to call the clinic with any problems, questions or concerns.   Cammie Sickle, MD 06/01/2022 3:08 PM

## 2022-06-01 NOTE — Progress Notes (Signed)
No concerns. 

## 2022-06-02 ENCOUNTER — Inpatient Hospital Stay: Payer: PPO

## 2022-06-14 DIAGNOSIS — Z961 Presence of intraocular lens: Secondary | ICD-10-CM | POA: Diagnosis not present

## 2022-06-22 ENCOUNTER — Inpatient Hospital Stay: Payer: PPO

## 2022-06-22 ENCOUNTER — Inpatient Hospital Stay (HOSPITAL_BASED_OUTPATIENT_CLINIC_OR_DEPARTMENT_OTHER): Payer: PPO | Admitting: Internal Medicine

## 2022-06-22 DIAGNOSIS — D46Z Other myelodysplastic syndromes: Secondary | ICD-10-CM

## 2022-06-22 DIAGNOSIS — D461 Refractory anemia with ring sideroblasts: Secondary | ICD-10-CM | POA: Diagnosis not present

## 2022-06-22 LAB — LACTATE DEHYDROGENASE: LDH: 113 U/L (ref 98–192)

## 2022-06-22 LAB — CBC WITH DIFFERENTIAL/PLATELET
Abs Immature Granulocytes: 0.18 10*3/uL — ABNORMAL HIGH (ref 0.00–0.07)
Basophils Absolute: 0 10*3/uL (ref 0.0–0.1)
Basophils Relative: 1 %
Eosinophils Absolute: 0.1 10*3/uL (ref 0.0–0.5)
Eosinophils Relative: 2 %
HCT: 28.6 % — ABNORMAL LOW (ref 36.0–46.0)
Hemoglobin: 9.6 g/dL — ABNORMAL LOW (ref 12.0–15.0)
Immature Granulocytes: 4 %
Lymphocytes Relative: 35 %
Lymphs Abs: 1.8 10*3/uL (ref 0.7–4.0)
MCH: 35.6 pg — ABNORMAL HIGH (ref 26.0–34.0)
MCHC: 33.6 g/dL (ref 30.0–36.0)
MCV: 105.9 fL — ABNORMAL HIGH (ref 80.0–100.0)
Monocytes Absolute: 0.6 10*3/uL (ref 0.1–1.0)
Monocytes Relative: 11 %
Neutro Abs: 2.5 10*3/uL (ref 1.7–7.7)
Neutrophils Relative %: 47 %
Platelets: 294 10*3/uL (ref 150–400)
RBC: 2.7 MIL/uL — ABNORMAL LOW (ref 3.87–5.11)
RDW: 27.1 % — ABNORMAL HIGH (ref 11.5–15.5)
WBC: 5.2 10*3/uL (ref 4.0–10.5)
nRBC: 1.4 % — ABNORMAL HIGH (ref 0.0–0.2)

## 2022-06-22 LAB — COMPREHENSIVE METABOLIC PANEL
ALT: 17 U/L (ref 0–44)
AST: 21 U/L (ref 15–41)
Albumin: 4.4 g/dL (ref 3.5–5.0)
Alkaline Phosphatase: 55 U/L (ref 38–126)
Anion gap: 3 — ABNORMAL LOW (ref 5–15)
BUN: 21 mg/dL (ref 8–23)
CO2: 27 mmol/L (ref 22–32)
Calcium: 8.9 mg/dL (ref 8.9–10.3)
Chloride: 107 mmol/L (ref 98–111)
Creatinine, Ser: 0.89 mg/dL (ref 0.44–1.00)
GFR, Estimated: >60 mL/min (ref >60)
Glucose, Bld: 117 mg/dL — ABNORMAL HIGH (ref 70–99)
Potassium: 4 mmol/L (ref 3.5–5.1)
Sodium: 137 mmol/L (ref 135–145)
Total Bilirubin: 0.8 mg/dL (ref 0.3–1.2)
Total Protein: 7.9 g/dL (ref 6.5–8.1)

## 2022-06-22 LAB — SAMPLE TO BLOOD BANK

## 2022-06-22 MED ORDER — DARBEPOETIN ALFA 500 MCG/ML IJ SOSY
500.0000 ug | PREFILLED_SYRINGE | Freq: Once | INTRAMUSCULAR | Status: AC
  Administered 2022-06-22: 500 ug via SUBCUTANEOUS
  Filled 2022-06-22: qty 1

## 2022-06-22 MED ORDER — LUSPATERCEPT-AAMT 75 MG ~~LOC~~ SOLR
1.0000 mg/kg | Freq: Once | SUBCUTANEOUS | Status: AC
  Administered 2022-06-22: 65 mg via SUBCUTANEOUS
  Filled 2022-06-22: qty 1.3

## 2022-06-22 NOTE — Assessment & Plan Note (Addendum)
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 MARCH 2023-bone marrow biopsy no evidence of obvious progression of MDS or any acute process.  Currently on aranesp plus Luspatercept  # Continue Aranesp 100 mcg every 2-3 weeks. Continue with Luspatercept #3 today. .Hemoglobin today is  9.6- STABLE  # Right shoulder pain/radiating to Right UE; ortho [Chris gaines];  MRI shoulder- continue ice/ heat for . -Continue prn NSAIDs- STABLE;   # Vaccination: COVID/flu shot recommended.     # DISPOSITION:  # proceed with Aranesp and Luspatecept  Today;  # cancel blood tomorrow.   # follow up in 3 weeks--MD;  labs CBC/cmp;:LDH; HOLD tube;  possible aranesp; Luspatercept  SQ; D-2 possible 1 unit of PRBC- dr.B

## 2022-06-22 NOTE — Progress Notes (Unsigned)
[name] CONSULT NOTE  Patient Care Team: [name], [name], MD as PCP - General Rogue Bussing, [name], MD as Consulting Physician (Hematology and Oncology)  CHIEF COMPLAINTS/PURPOSE OF CONSULTATION: MDS   Oncology History Overview Note   # July 2020-myelodysplastic syndrome with ringed sideroblasts-Normal karyotype; no blasts [IPSS R-Very low risk ~median survival 8.3 years]; iron studies T55 folic acid myeloma panel normal; pyridoxine levels/copper/zinc-WNL. Erythropoietin levels-60. II OPINION at [facility] [Dr.DeCastro]  # [facility]/ NGS: TET2([id number])c.2524delT(p.Ser842GlnfsTer31) Exon 3 frame-shift SF3B1([id number])c.2098A>G(p.Lys700Glu) Exon 15 missense  DNMT3A([id number])c.2204A>G(p.Tyr735Cys) Exon 19 missense   # JAN 11th 2020- Aranesp/retacrit;  # July 30th, 2021- START REVLIMID 10 mg/day  [SF3B19mtation]  MARCH 2023- BONE MARROW, ASPIRATE, CLOT, CORE:  -  Hypercellular marrow involved by myelodysplastic syndrome with ring  sideroblasts (MDS-RS)  -  Polytypic plasmacytosis   # MARCh 2023-increase the frequency of Aranesp 400 mcg every 2 weeks; continue Revlimid.   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; September 2021 ejection fraction 60 to 65%;   # colonoscopy- 2016 [Dr.Bucinni];  DIAGNOSIS: MDS/low-grade with ringed sideroblasts  STAGE: Low       ;  GOALS: Control  CURRENT/MOST RECENT THERAPY : EPO agent+ REV    MDS (myelodysplastic syndrome), low grade ([facility]  04/23/2019 Initial Diagnosis   [facility] (myel[facility]plastic syndrome), low grade ([facility]   05/11/2022 - 05/11/2022 Chemotherapy   Patient is on Treatment Plan : MYELODYSPLASIA Luspatercept q21d     05/11/2022 -  Chemotherapy   Patient is on Treatment Plan : MYELODYSPLASIA Luspatercept q21d       HISTORY OF PRESENTING ILLNESS: Accompanied by husband ambulating independently.  [facility]la [name] 71 y.o.  female history of low-grade MDS  anemia with ring sideroblasts currently on Aranesp/Luspatercept is here for follow-up.    Patient continues to have right shoulder pain; rotator cuff tear-however not any worse. But overall stable. No worsening fatigue.    No nausea no vomiting.  Denies any diarrhea.  Review of Systems  Constitutional:  Positive for malaise/fatigue. Negative for chills, diaphoresis and fever.  HENT:  Negative for nosebleeds and sore throat.   Eyes:  Negative for double vision.  Respiratory:  Negative for cough, hemoptysis, sputum production, shortness of breath and wheezing.   Cardiovascular:  Negative for chest pain and orthopnea.  Gastrointestinal:  Negative for abdominal pain, blood in stool, constipation, diarrhea, heartburn, melena and vomiting.  Genitourinary:  Negative for dysuria, frequency and urgency.  Musculoskeletal:  Positive for back pain and joint pain.  Skin: Negative.  Negative for itching and rash.  Neurological:  Negative for dizziness, tingling, focal weakness, weakness and headaches.  Endo/Heme/Allergies:  Does not bruise/bleed easily.  Psychiatric/Behavioral:  Negative for depression. The patient is not nervous/anxious and does not have insomnia.     MEDICAL HISTORY:  Past Medical History:  Diagnosis Date  . Allergic rhinitis, cause unspecified   . Anemia, unspecified   . Carpal tunnel syndrome   . Cystitis, unspecified   . Family history of osteoporosis   . PONV (postoperative nausea and vomiting)   . Postnasal drip   . Syncope and collapse     SURGICAL HISTORY: Past Surgical History:  Procedure Laterality Date  . BONE MARROW BIOPSY    . CARPAL TUNNEL RELEASE    . COLONOSCOPY  09/28/2003  . COLONOSCOPY WITH PROPOFOL N/A 07/31/2015   Procedure: COLONOSCOPY WITH PROPOFOL;  Surgeon: RRonald Lobo MD;  Location: [facility];  Service: Endoscopy;  Laterality: N/A;  .  DIAGNOSTIC LAPAROSCOPY     dx lack of pregnancy   . EYE SURGERY     lasik, cataract, prk,yag procedure     SOCIAL HISTORY: Social History   Socioeconomic History  . Marital status: Married    Spouse name: Not on file  . Number of children: Not on file  . Years of education: Not on file  . Highest education level: Not on file  Occupational History  . Not on file  Tobacco Use  . Smoking status: Never  . Smokeless tobacco: Never  Vaping Use  . Vaping Use: Never used  Substance and Sexual Activity  . Alcohol use: Yes    Alcohol/week: 0.0 standard drinks of alcohol    Comment: Rare  . Drug use: No  . Sexual activity: Not Currently  Other Topics Concern  . Not on file  Social History Narrative   No regular exercise      Drinks lots of [employer]         Social Determinants of Health   Financial Resource Strain: Low Risk  (05/20/2022)   Overall Financial Resource Strain (CARDIA)   . Difficulty of Paying Living Expenses: Not very hard  Food Insecurity: No Food Insecurity (05/20/2022)   Hunger Vital Sign   . Worried About Charity fundraiser in the Last Year: Never true   . Ran Out of Food in the Last Year: Never true  Transportation Needs: No Transportation Needs (05/20/2022)   PRAPARE - Transportation   . Lack of Transportation (Medical): No   . Lack of Transportation (Non-Medical): No  Physical Activity: Inactive (05/20/2022)   Exercise Vital Sign   . Days of Exercise per Week: 0 days   . Minutes of Exercise per Session: 0 min  Stress: No Stress Concern Present (05/20/2022)   [place]   . Feeling of Stress : Only a little  Social Connections: Moderately Integrated (05/20/2022)   Social Connection and Isolation Panel [NHANES]   . Frequency of Communication with Friends and Family: More than three times a week   . Frequency of Social Gatherings with Friends and Family: Once a week   . Attends Religious Services: More than 4 times per year   . Active Member of Clubs or Organizations: No   . Attends Theatre manager Meetings: Never   . Marital Status: Married  Human resources officer Violence: Not At Risk (05/20/2022)   Humiliation, Afraid, Rape, and Kick questionnaire   . Fear of Current or Ex-Partner: No   . Emotionally Abused: No   . Physically Abused: No   . Sexually Abused: No    FAMILY HISTORY: Family History  Problem Relation Age of Onset  . Osteoporosis Mother   . Stroke Mother   . Coronary artery disease Father   . Stroke Father 56  . Diabetes Other        Aunts and uncles  . Breast cancer Paternal Aunt   . Breast cancer Maternal Aunt   . Arthritis/Rheumatoid Child   . Breast cancer Cousin     ALLERGIES:  is allergic to codeine.  MEDICATIONS:  Current Outpatient Medications  Medication Sig Dispense Refill  . acetaminophen (TYLENOL) 500 MG tablet Take 500-1,000 mg by mouth every 6 (six) hours as needed for mild pain or headache.    Marland Kitchen aspirin EC 81 MG tablet Take 81 mg by mouth at bedtime. Swallow whole.     . Biotin 10 MG CAPS Take by  mouth.    . Calcium Carb-Cholecalciferol (CALCIUM-VITAMIN D3) 600-10 MG-MCG CAPS Take by mouth in the morning and at bedtime. Taking twice dailly    . Darbepoetin Alfa (ARANESP) 500 MCG/ML SOSY injection Inject 500 mcg into the skin See admin instructions. Inject 500 mcg into the skin every week    . ergocalciferol (VITAMIN D2) 1.25 MG (50000 UT) capsule Take 1 capsule (50,000 Units total) by mouth once a week. 12 capsule 0  . levothyroxine (SYNTHROID) 25 MCG tablet TAKE 1 TABLET BY MOUTH ONCE A DAY BEFOREBREAKFAST. 90 tablet 3  . ondansetron (ZOFRAN ODT) 8 MG disintegrating tablet Take 1 tablet (8 mg total) by mouth every 8 (eight) hours as needed for nausea or vomiting. 30 tablet 0  . VITAMIN D PO Take 2,000 Units by mouth daily.     No current facility-administered medications for this visit.      PHYSICAL EXAMINATION:   Vitals:   06/22/22 1531  BP: 121/62  Pulse: 66  Resp: 20  Temp: 97.8 F (36.6 C)  SpO2: 100%     Filed  Weights   06/22/22 1531  Weight: 145 lb 12.8 oz (66.1 kg)      Physical Exam HENT:     Head: Normocephalic and atraumatic.     Mouth/Throat:     Pharynx: No oropharyngeal exudate.  Eyes:     Pupils: Pupils are equal, round, and reactive to light.  Cardiovascular:     Rate and Rhythm: Normal rate and regular rhythm.     Pulses: Normal pulses.     Heart sounds: Normal heart sounds.  Pulmonary:     Effort: Pulmonary effort is normal. No respiratory distress.     Breath sounds: Normal breath sounds. No wheezing.  Abdominal:     General: Bowel sounds are normal. There is no distension.     Palpations: Abdomen is soft. There is no mass.     Tenderness: There is no abdominal tenderness. There is no guarding or rebound.  Musculoskeletal:        General: No tenderness. Normal range of motion.     Cervical back: Normal range of motion and neck supple.  Skin:    General: Skin is warm.  Neurological:     Mental Status: She is alert and oriented to person, place, and time.  Psychiatric:        Mood and Affect: Affect normal.    LABORATORY DATA:  I have reviewed the data as listed Lab Results  Component Value Date   WBC 5.2 06/22/2022   HGB 9.6 (L) 06/22/2022   HCT 28.6 (L) 06/22/2022   MCV 105.9 (H) 06/22/2022   PLT 294 06/22/2022   Recent Labs    04/12/22 1054 04/23/22 0741 05/11/22 1047 06/22/22 1516  NA 138 139 138 137  K 3.9 3.9 4.0 4.0  CL 107 106 105 107  CO2 26 27 26 27   GLUCOSE 145* 88 136* 117*  BUN 14 17 22 21   CREATININE 0.92 0.92 0.96 0.89  CALCIUM 8.7* 8.8 9.0 8.9  GFRNONAA >60  --  >60 >60  PROT 7.7 7.1 7.9 7.9  ALBUMIN 4.4 4.2 4.3 4.4  AST 21 14 20 21   ALT 13 11 16 17   ALKPHOS 46 50 53 55  BILITOT 1.0 0.9 1.2 0.8     No results found.  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 POSITIVE  For SF3B1; R-IPSS-low risk. MARCH 2023-bone marrow biopsy no  evidence of obvious progression of  MDS or any acute process.  Currently on aranesp plus Luspatercept  # Continue Aranesp 100 mcg every 2-3 weeks. Continue with Luspatercept #3 today. .Hemoglobin today is  9.6- STABLE  # Right shoulder pain/radiating to Right UE; ortho [Chris gaines];  MRI shoulder- continue ice/ heat for . -Continue prn NSAIDs- STABLE;   # Vaccination: COVID/flu shot recommended.     # DISPOSITION:  # proceed with Aranesp and Luspatecept  Today;  # cancel blood tomorrow.   # follow up in 3 weeks--MD;  labs CBC/cmp;:LDH; HOLD tube;  possible aranesp; Luspatercept  SQ; D-2 possible 1 unit of PRBC- dr.B  All questions were answered. The patient knows to call the clinic with any problems, questions or concerns.   Cammie Sickle, MD 06/23/2022 7:43 AM

## 2022-06-23 ENCOUNTER — Encounter: Payer: Self-pay | Admitting: Internal Medicine

## 2022-06-23 ENCOUNTER — Inpatient Hospital Stay: Payer: PPO

## 2022-06-30 ENCOUNTER — Ambulatory Visit
Admission: RE | Admit: 2022-06-30 | Discharge: 2022-06-30 | Disposition: A | Discharge status: Home / Self Care | Payer: PPO | Source: Ambulatory Visit | Attending: Family Medicine | Admitting: Family Medicine

## 2022-06-30 DIAGNOSIS — Z1231 Encounter for screening mammogram for malignant neoplasm of breast: Secondary | ICD-10-CM | POA: Diagnosis not present

## 2022-07-14 ENCOUNTER — Inpatient Hospital Stay: Payer: PPO

## 2022-07-14 ENCOUNTER — Encounter: Payer: Self-pay | Admitting: Internal Medicine

## 2022-07-14 ENCOUNTER — Inpatient Hospital Stay: Payer: PPO | Attending: Internal Medicine

## 2022-07-14 ENCOUNTER — Inpatient Hospital Stay (HOSPITAL_BASED_OUTPATIENT_CLINIC_OR_DEPARTMENT_OTHER): Payer: PPO | Admitting: Internal Medicine

## 2022-07-14 VITALS — BP 99/63 | HR 64 | Temp 97.1°F | Resp 16 | Wt 146.7 lb

## 2022-07-14 DIAGNOSIS — D46Z Other myelodysplastic syndromes: Secondary | ICD-10-CM | POA: Diagnosis not present

## 2022-07-14 DIAGNOSIS — D461 Refractory anemia with ring sideroblasts: Secondary | ICD-10-CM | POA: Diagnosis not present

## 2022-07-14 LAB — COMPREHENSIVE METABOLIC PANEL
ALT: 16 U/L (ref 0–44)
AST: 21 U/L (ref 15–41)
Albumin: 4.2 g/dL (ref 3.5–5.0)
Alkaline Phosphatase: 46 U/L (ref 38–126)
Anion gap: 7 (ref 5–15)
BUN: 17 mg/dL (ref 8–23)
CO2: 26 mmol/L (ref 22–32)
Calcium: 9.2 mg/dL (ref 8.9–10.3)
Chloride: 106 mmol/L (ref 98–111)
Creatinine, Ser: 1.03 mg/dL — ABNORMAL HIGH (ref 0.44–1.00)
GFR, Estimated: 58 mL/min — ABNORMAL LOW (ref >60)
Glucose, Bld: 127 mg/dL — ABNORMAL HIGH (ref 70–99)
Potassium: 4.2 mmol/L (ref 3.5–5.1)
Sodium: 139 mmol/L (ref 135–145)
Total Bilirubin: 1 mg/dL (ref 0.3–1.2)
Total Protein: 7.8 g/dL (ref 6.5–8.1)

## 2022-07-14 LAB — CBC WITH DIFFERENTIAL/PLATELET
Abs Immature Granulocytes: 0.21 10*3/uL — ABNORMAL HIGH (ref 0.00–0.07)
Basophils Absolute: 0 10*3/uL (ref 0.0–0.1)
Basophils Relative: 1 %
Eosinophils Absolute: 0.1 10*3/uL (ref 0.0–0.5)
Eosinophils Relative: 2 %
HCT: 31.4 % — ABNORMAL LOW (ref 36.0–46.0)
Hemoglobin: 9.9 g/dL — ABNORMAL LOW (ref 12.0–15.0)
Immature Granulocytes: 4 %
Lymphocytes Relative: 31 %
Lymphs Abs: 1.5 10*3/uL (ref 0.7–4.0)
MCH: 33.3 pg (ref 26.0–34.0)
MCHC: 31.5 g/dL (ref 30.0–36.0)
MCV: 105.7 fL — ABNORMAL HIGH (ref 80.0–100.0)
Monocytes Absolute: 0.5 10*3/uL (ref 0.1–1.0)
Monocytes Relative: 11 %
Neutro Abs: 2.6 10*3/uL (ref 1.7–7.7)
Neutrophils Relative %: 51 %
Platelets: 286 10*3/uL (ref 150–400)
RBC: 2.97 MIL/uL — ABNORMAL LOW (ref 3.87–5.11)
RDW: 27 % — ABNORMAL HIGH (ref 11.5–15.5)
WBC: 5 10*3/uL (ref 4.0–10.5)
nRBC: 1.4 % — ABNORMAL HIGH (ref 0.0–0.2)

## 2022-07-14 LAB — SAMPLE TO BLOOD BANK

## 2022-07-14 LAB — LACTATE DEHYDROGENASE: LDH: 112 U/L (ref 98–192)

## 2022-07-14 MED ORDER — LUSPATERCEPT-AAMT 75 MG ~~LOC~~ SOLR
1.0000 mg/kg | Freq: Once | SUBCUTANEOUS | Status: AC
  Administered 2022-07-14: 65 mg via SUBCUTANEOUS
  Filled 2022-07-14: qty 0.5

## 2022-07-14 NOTE — Assessment & Plan Note (Addendum)
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 MARCH 2023-bone marrow biopsy no evidence of obvious progression of MDS or any acute process.  Currently on Aranesp 100 mcg every 2-3 week plus Luspatercept  #  Continue with Luspatercept #4 today. # HOLD Aranesp today- Hb 9.9 today. Hemoglobin today is  9.9- STABLE  # Vaccination: s/p flu shot recommended. Consider/recommend COVID/ RSV  # DISPOSITION: # proceed with  Luspatecept  Today; HOLD Aranesp today  # cancel blood tomorrow.   # follow up in 3 weeks--MD;  labs CBC/cmp;:LDH; HOLD tube;  possible aranesp; Luspatercept  SQ;  Dr.B

## 2022-07-14 NOTE — Progress Notes (Signed)
[name] CONSULT NOTE  Patient Care Team: [name], Carla Fanny, MD as PCP - General Rogue Bussing, Carla Headland, MD as Consulting Physician (Hematology and Oncology)  CHIEF COMPLAINTS/PURPOSE OF CONSULTATION: MDS   Oncology History Overview Note   # July 2020-myelodysplastic syndrome with ringed sideroblasts-Normal karyotype; no blasts [IPSS R-Very low risk ~median survival 8.3 years]; iron studies W41 folic acid myeloma panel normal; pyridoxine levels/copper/zinc-WNL. Erythropoietin levels-60. II OPINION at [place] [Dr.DeCastro]  # [facility]/ NGS: TET2([id number])c.2524delT(p.Ser842GlnfsTer31) Exon 3 frame-shift SF3B1([id number])c.2098A>G(p.Lys700Glu) Exon 15 missense  DNMT3A([id number])c.2204A>G(p.Tyr735Cys) Exon 19 missense   # JAN 11th 2020- Aranesp/retacrit;  # July 30th, 2021- START REVLIMID 10 mg/day  [SF3B10mtation]  MARCH 2023- BONE MARROW, ASPIRATE, CLOT, CORE:  -  Hypercellular marrow involved by myelodysplastic syndrome with ring  sideroblasts (MDS-RS)  -  Polytypic plasmacytosis   # MARCh 2023-increase the frequency of Aranesp 400 mcg every 2 weeks; continue Revlimid.   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; September 2021 ejection fraction 60 to 65%;   # colonoscopy- 2016 [Dr.Bucinni];  DIAGNOSIS: MDS/low-grade with ringed sideroblasts  STAGE: Low       ;  GOALS: Control  CURRENT/MOST RECENT THERAPY : EPO agent+ REV    [place]  [place] Initial Diagnosis   [place]   05/11/2022 - 05/11/2022 Chemotherapy   Patient is on Treatment Plan : MYELODYSPLASIA Luspatercept q21d     05/11/2022 -  Chemotherapy   Patient is on Treatment Plan : MYELODYSPLASIA Luspatercept q21d       HISTORY OF PRESENTING ILLNESS: Accompanied by husband ambulating independently.  Carla SchroepferMay 71 y.o.  female history of low-grade MDS  anemia with ring sideroblasts currently on Aranesp/Luspatercept is here for follow-up.    No worsening fatigue. No nausea no vomiting.  Denies any diarrhea. Mild joint pain- not any significantly worse.   Review of Systems  Constitutional:  Positive for malaise/fatigue. Negative for chills, diaphoresis and fever.  HENT:  Negative for nosebleeds and sore throat.   Eyes:  Negative for double vision.  Respiratory:  Negative for cough, hemoptysis, sputum production, shortness of breath and wheezing.   Cardiovascular:  Negative for chest pain and orthopnea.  Gastrointestinal:  Negative for abdominal pain, blood in stool, constipation, diarrhea, heartburn, melena and vomiting.  Genitourinary:  Negative for dysuria, frequency and urgency.  Musculoskeletal:  Positive for back pain and joint pain.  Skin: Negative.  Negative for itching and rash.  Neurological:  Negative for dizziness, tingling, focal weakness, weakness and headaches.  Endo/Heme/Allergies:  Does not bruise/bleed easily.  Psychiatric/Behavioral:  Negative for depression. The patient is not nervous/anxious and does not have insomnia.     MEDICAL HISTORY:  Past Medical History:  Diagnosis Date  . Allergic rhinitis, cause unspecified   . Anemia, unspecified   . Carpal tunnel syndrome   . Cystitis, unspecified   . Family history of osteoporosis   . PONV (postoperative nausea and vomiting)   . Postnasal drip   . Syncope and collapse     SURGICAL HISTORY: Past Surgical History:  Procedure Laterality Date  . BONE MARROW BIOPSY    . CARPAL TUNNEL RELEASE    . COLONOSCOPY  09/28/2003  . COLONOSCOPY WITH PROPOFOL N/A 07/31/2015   Procedure: COLONOSCOPY WITH PROPOFOL;  Surgeon: RRonald Lobo MD;  Location: [facility];  Service: Endoscopy;  Laterality: N/A;  . DIAGNOSTIC LAPAROSCOPY     dx lack of pregnancy   .  EYE SURGERY     lasik, cataract, prk,yag procedure    SOCIAL HISTORY: Social History   Socioeconomic History   . Marital status: Married    Spouse name: Not on file  . Number of children: Not on file  . Years of education: Not on file  . Highest education level: Not on file  Occupational History  . Not on file  Tobacco Use  . Smoking status: Never  . Smokeless tobacco: Never  Vaping Use  . Vaping Use: Never used  Substance and Sexual Activity  . Alcohol use: Yes    Alcohol/week: 0.0 standard drinks of alcohol    Comment: Rare  . Drug use: No  . Sexual activity: Not Currently  Other Topics Concern  . Not on file  Social History Narrative   No regular exercise      Drinks lots of [employer]eps[employer]         Social Determinants of Health   Financial Resource Strain: Low Risk  (05/20/2022)   Overall Financial Resource Strain (CARDIA)   . Difficulty of Paying Living Expenses: Not very hard  Food Insecurity: No Food Insecurity (05/20/2022)   Hunger Vital Sign   . Worried About Charity fundraiser in the Last Year: Never true   . Ran Out of Food in the Last Year: Never true  Transportation Needs: No Transportation Needs (05/20/2022)   PRAPARE - Transportation   . Lack of Transportation (Medical): No   . Lack of Transportation (Non-Medical): No  Physical Activity: Inactive (05/20/2022)   Exercise Vital Sign   . Days of Exercise per Week: 0 days   . Minutes of Exercise per Session: 0 min  Stress: No Stress Concern Present (05/20/2022)   [place]   . Feeling of Stress : Only a little  Social Connections: Moderately Integrated (05/20/2022)   Social Connection and Isolation Panel [NHANES]   . Frequency of Communication with Friends and Family: More than three times a week   . Frequency of Social Gatherings with Friends and Family: Once a week   . Attends Religious Services: More than 4 times per year   . Active Member of Clubs or Organizations: No   . Attends Archivist Meetings: Never   . Marital Status: Married  Arboriculturist Violence: Not At Risk (05/20/2022)   Humiliation, Afraid, Rape, and Kick questionnaire   . Fear of Current or Ex-Partner: No   . Emotionally Abused: No   . Physically Abused: No   . Sexually Abused: No    FAMILY HISTORY: Family History  Problem Relation Age of Onset  . Osteoporosis Mother   . Stroke Mother   . Coronary artery disease Father   . Stroke Father 102  . Diabetes Other        Aunts and uncles  . Breast cancer Paternal Aunt   . Breast cancer Maternal Aunt   . Arthritis/Rheumatoid Child   . Breast cancer Cousin     ALLERGIES:  is allergic to codeine.  MEDICATIONS:  Current Outpatient Medications  Medication Sig Dispense Refill  . acetaminophen (TYLENOL) 500 MG tablet Take 500-1,000 mg by mouth every 6 (six) hours as needed for mild pain or headache.    Marland Kitchen aspirin EC 81 MG tablet Take 81 mg by mouth at bedtime. Swallow whole.     . Biotin 10 MG CAPS Take by mouth.    . Calcium Carb-Cholecalciferol (CALCIUM-VITAMIN D3) 600-10 MG-MCG CAPS Take  by mouth in the morning and at bedtime. Taking twice dailly    . Darbepoetin Alfa (ARANESP) 500 MCG/ML SOSY injection Inject 500 mcg into the skin See admin instructions. Inject 500 mcg into the skin every week    . ergocalciferol (VITAMIN D2) 1.25 MG (50000 UT) capsule Take 1 capsule (50,000 Units total) by mouth once a week. 12 capsule 0  . levothyroxine (SYNTHROID) 25 MCG tablet TAKE 1 TABLET BY MOUTH ONCE A DAY BEFOREBREAKFAST. 90 tablet 3  . ondansetron (ZOFRAN ODT) 8 MG disintegrating tablet Take 1 tablet (8 mg total) by mouth every 8 (eight) hours as needed for nausea or vomiting. 30 tablet 0  . VITAMIN D PO Take 2,000 Units by mouth daily.     No current facility-administered medications for this visit.      PHYSICAL EXAMINATION:   Vitals:   07/14/22 1000  BP: 99/63  Pulse: 64  Resp: 16  Temp: (!) 97.1 F (36.2 C)     Filed Weights   07/14/22 1000  Weight: 146 lb 11.2 oz (66.5 kg)      Physical  Exam HENT:     Head: Normocephalic and atraumatic.     Mouth/Throat:     Pharynx: No oropharyngeal exudate.  Eyes:     Pupils: Pupils are equal, round, and reactive to light.  Cardiovascular:     Rate and Rhythm: Normal rate and regular rhythm.     Pulses: Normal pulses.     Heart sounds: Normal heart sounds.  Pulmonary:     Effort: Pulmonary effort is normal. No respiratory distress.     Breath sounds: Normal breath sounds. No wheezing.  Abdominal:     General: Bowel sounds are normal. There is no distension.     Palpations: Abdomen is soft. There is no mass.     Tenderness: There is no abdominal tenderness. There is no guarding or rebound.  Musculoskeletal:        General: No tenderness. Normal range of motion.     Cervical back: Normal range of motion and neck supple.  Skin:    General: Skin is warm.  Neurological:     Mental Status: She is alert and oriented to person, place, and time.  Psychiatric:        Mood and Affect: Affect normal.    LABORATORY DATA:  I have reviewed the data as listed Lab Results  Component Value Date   WBC 5.0 07/14/2022   HGB 9.9 (L) 07/14/2022   HCT 31.4 (L) 07/14/2022   MCV 105.7 (H) 07/14/2022   PLT 286 07/14/2022   Recent Labs    05/11/22 1047 06/22/22 1516 07/14/22 0938  NA 138 137 139  K 4.0 4.0 4.2  CL 105 107 106  CO2 26 27 26   GLUCOSE 136* 117* 127*  BUN 22 21 17   CREATININE 0.96 0.89 1.03*  CALCIUM 9.0 8.9 9.2  GFRNONAA >60 >60 58*  PROT 7.9 7.9 7.8  ALBUMIN 4.3 4.4 4.2  AST 20 21 21   ALT 16 17 16   ALKPHOS 53 55 46  BILITOT 1.2 0.8 1.0     MM 3D SCREEN BREAST BILATERAL  Result Date: 07/01/2022 CLINICAL DATA:  Screening. EXAM: DIGITAL SCREENING BILATERAL MAMMOGRAM WITH TOMOSYNTHESIS AND CAD TECHNIQUE: Bilateral screening digital craniocaudal and mediolateral oblique mammograms were obtained. Bilateral screening digital breast tomosynthesis was performed. The images were evaluated with computer-aided detection.  COMPARISON:  Previous exam(s). ACR Breast Density Category b: There are scattered areas of fibroglandular density.  FINDINGS: There are no findings suspicious for malignancy. IMPRESSION: No mammographic evidence of malignancy. A result letter of this screening mammogram will be mailed directly to the patient. RECOMMENDATION: Screening mammogram in one year. (Code:SM-B-01Y) BI-RADS CATEGORY  1: Negative. Electronically Signed   By: Claudie Revering M.D.   On: 07/01/2022 10:57    MDS (myelodysplastic syndrome), low grade ([facility]) # Low grade MDS- with ringed sideroblasts; no blasts. POSITIVE  For SF3B1; R-IPSS-low risk. MARCH 2023-bone marrow biopsy no evidence of obvious progression of MDS or any acute process.  Currently on Aranesp 100 mcg every 2-3 week plus Luspatercept  #  Continue with Luspatercept #4 today. # HOLD Aranesp today- Hb 9.9 today. Hemoglobin today is  9.9- STABLE  # Vaccination: s/p flu shot recommended. Consider/recommend COVID/ RSV  # DISPOSITION: # proceed with  Luspatecept  Today; HOLD Aranesp today  # cancel blood tomorrow.   # follow up in 3 weeks--MD;  labs CBC/cmp;:LDH; HOLD tube;  possible aranesp; Luspatercept  SQ;  Dr.B  All questions were answered. The patient knows to call the clinic with any problems, questions or concerns.   Cammie Sickle, MD 07/14/2022 10:29 AM

## 2022-07-14 NOTE — Progress Notes (Signed)
Patient denies new problems/concerns today.    BP 99/63, HR 64-asymptomatic

## 2022-07-15 ENCOUNTER — Inpatient Hospital Stay: Payer: PPO

## 2022-07-29 ENCOUNTER — Encounter: Admit: 2022-07-29 | Discharge: 2022-07-29 | Payer: MEDICARE

## 2022-08-03 ENCOUNTER — Inpatient Hospital Stay: Payer: PPO | Attending: Internal Medicine

## 2022-08-03 ENCOUNTER — Inpatient Hospital Stay: Payer: PPO

## 2022-08-03 ENCOUNTER — Encounter: Payer: Self-pay | Admitting: Medical Oncology

## 2022-08-03 ENCOUNTER — Inpatient Hospital Stay (HOSPITAL_BASED_OUTPATIENT_CLINIC_OR_DEPARTMENT_OTHER): Payer: PPO | Admitting: Medical Oncology

## 2022-08-03 VITALS — BP 126/69 | HR 62 | Temp 96.8°F | Ht 63.0 in | Wt 148.4 lb

## 2022-08-03 DIAGNOSIS — D46Z Other myelodysplastic syndromes: Secondary | ICD-10-CM

## 2022-08-03 DIAGNOSIS — D461 Refractory anemia with ring sideroblasts: Secondary | ICD-10-CM | POA: Diagnosis not present

## 2022-08-03 DIAGNOSIS — E039 Hypothyroidism, unspecified: Secondary | ICD-10-CM | POA: Insufficient documentation

## 2022-08-03 DIAGNOSIS — D539 Nutritional anemia, unspecified: Secondary | ICD-10-CM

## 2022-08-03 LAB — COMPREHENSIVE METABOLIC PANEL
ALT: 16 U/L (ref 0–44)
AST: 21 U/L (ref 15–41)
Albumin: 4.3 g/dL (ref 3.5–5.0)
Alkaline Phosphatase: 51 U/L (ref 38–126)
Anion gap: 8 (ref 5–15)
BUN: 19 mg/dL (ref 8–23)
CO2: 24 mmol/L (ref 22–32)
Calcium: 9.1 mg/dL (ref 8.9–10.3)
Chloride: 104 mmol/L (ref 98–111)
Creatinine, Ser: 0.96 mg/dL (ref 0.44–1.00)
GFR, Estimated: >60 mL/min (ref >60)
Glucose, Bld: 101 mg/dL — ABNORMAL HIGH (ref 70–99)
Potassium: 4.4 mmol/L (ref 3.5–5.1)
Sodium: 136 mmol/L (ref 135–145)
Total Bilirubin: 0.9 mg/dL (ref 0.3–1.2)
Total Protein: 8 g/dL (ref 6.5–8.1)

## 2022-08-03 LAB — CBC WITH DIFFERENTIAL/PLATELET
Abs Immature Granulocytes: 0.37 10*3/uL — ABNORMAL HIGH (ref 0.00–0.07)
Basophils Absolute: 0.1 10*3/uL (ref 0.0–0.1)
Basophils Relative: 1 %
Eosinophils Absolute: 0.1 10*3/uL (ref 0.0–0.5)
Eosinophils Relative: 1 %
HCT: 30.6 % — ABNORMAL LOW (ref 36.0–46.0)
Hemoglobin: 9.8 g/dL — ABNORMAL LOW (ref 12.0–15.0)
Immature Granulocytes: 6 %
Lymphocytes Relative: 25 %
Lymphs Abs: 1.5 10*3/uL (ref 0.7–4.0)
MCH: 33.2 pg (ref 26.0–34.0)
MCHC: 32 g/dL (ref 30.0–36.0)
MCV: 103.7 fL — ABNORMAL HIGH (ref 80.0–100.0)
Monocytes Absolute: 0.9 10*3/uL (ref 0.1–1.0)
Monocytes Relative: 14 %
Neutro Abs: 3.2 10*3/uL (ref 1.7–7.7)
Neutrophils Relative %: 53 %
Platelets: 274 10*3/uL (ref 150–400)
RBC: 2.95 MIL/uL — ABNORMAL LOW (ref 3.87–5.11)
RDW: 27.3 % — ABNORMAL HIGH (ref 11.5–15.5)
Smear Review: NORMAL
WBC: 6.1 10*3/uL (ref 4.0–10.5)
nRBC: 2.3 % — ABNORMAL HIGH (ref 0.0–0.2)

## 2022-08-03 LAB — LACTATE DEHYDROGENASE: LDH: 131 U/L (ref 98–192)

## 2022-08-03 LAB — SAMPLE TO BLOOD BANK

## 2022-08-03 MED ORDER — LUSPATERCEPT-AAMT 75 MG ~~LOC~~ SOLR
1.0000 mg/kg | Freq: Once | SUBCUTANEOUS | Status: AC
  Administered 2022-08-03: 65 mg via SUBCUTANEOUS
  Filled 2022-08-03: qty 1.3

## 2022-08-03 MED ORDER — DARBEPOETIN ALFA 500 MCG/ML IJ SOSY
500.0000 ug | PREFILLED_SYRINGE | Freq: Once | INTRAMUSCULAR | Status: AC
  Administered 2022-08-03: 500 ug via SUBCUTANEOUS
  Filled 2022-08-03: qty 1

## 2022-08-03 NOTE — Addendum Note (Signed)
Addended by: Nelwyn Salisbury on: 08/03/2022 10:46 AM   Modules accepted: Orders
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
## 2022-08-04 ENCOUNTER — Ambulatory Visit: Payer: PPO

## 2022-08-04 ENCOUNTER — Other Ambulatory Visit: Payer: PPO

## 2022-08-04 ENCOUNTER — Ambulatory Visit: Payer: PPO | Admitting: Internal Medicine

## 2022-08-23 ENCOUNTER — Telehealth: Payer: Self-pay | Admitting: *Deleted

## 2022-08-23 ENCOUNTER — Inpatient Hospital Stay (HOSPITAL_BASED_OUTPATIENT_CLINIC_OR_DEPARTMENT_OTHER): Payer: PPO | Admitting: Medical Oncology

## 2022-08-23 DIAGNOSIS — J029 Acute pharyngitis, unspecified: Secondary | ICD-10-CM | POA: Diagnosis not present

## 2022-08-23 DIAGNOSIS — Z803 Family history of malignant neoplasm of breast: Secondary | ICD-10-CM | POA: Diagnosis not present

## 2022-08-23 NOTE — Telephone Encounter (Signed)
Contacted patient via telephone. Pt sore throat started a few days before Thanksgiving. Patient had taken Tylenol last week, but has not taken any NSAIDS. Patient instructed to take a home covid test and mychart visit with Judson Roch, [place] this afternoon.

## 2022-08-23 NOTE — Telephone Encounter (Signed)
Patient called reporting that she is to come in for an  injection tomorrow, but she has swollen tender glands in her neck and a sore throat. She does not have a fever, or congestion. She is asking if she is OK to come tomorrow an dif she needs to do anything about her symptoms. Please advise

## 2022-08-23 NOTE — Progress Notes (Signed)
Virtual Visit Progress Note  Ms. [name],you are scheduled for a virtual visit with your provider today.    Just as we do with appointments in the office, we must obtain your consent to participate.  Your consent will be active for this visit and any virtual visit you [name] have with one of our providers in the next 365 days.    If you have a MyChart account, I can also send a copy of this consent to you electronically.  All virtual visits are billed to your insurance company just like a traditional visit in the office.  As this is a virtual visit, video technology does not allow for your provider to perform a traditional examination.  This Ibe limit your provider's ability to fully assess your condition.  If your provider identifies any concerns that need to be evaluated in person or the need to arrange testing such as labs, EKG, etc, we will make arrangements to do so.    Although advances in technology are sophisticated, we cannot ensure that it will always work on either your end or our end.  If the connection with a video visit is poor, we [place] have to switch to a telephone visit.  With either a video or telephone visit, we are not always able to ensure that we have a secure connection.   I need to obtain your verbal consent now.   Are you willing to proceed with your visit today?   Kahealani Yankovich [name] has provided verbal consent on 08/23/2022 for a virtual visit (video or telephone).   Hughie Closs, [name] 08/23/2022  2:17 PM    I connected with Lorri Frederick Mccaughey on 08/23/22 at  1:30 PM EST by video enabled telemedicine visit and verified that I am speaking with the correct person using two identifiers.   I discussed the limitations, risks, security and privacy concerns of performing an evaluation and management service by telemedicine and the availability of in-person appointments. I also discussed with the patient that there Tayler be a patient responsible charge related to this service. The patient  expressed understanding and agreed to proceed.   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  She reports that symptoms of scantly sore throat and post nasal drainage started at the beginning of last week. A few days ago she noticed some soreness of her neck. Then realized that her neck was swollen on both sides of her neck in the area of her lymph nodes. Sore to the touch on both sides. She denies cough, fever. No other cold symptoms. Feeling well otherwise. No sick contacts with strep, influenza. Home COVID-19 test negative. No recent unintentional weight loss. She is able to eat and drink well. Thought is mildly sore. No cats or cat exposures.   Review of systems- Review of Systems  Constitutional:  Negative for chills, fever and malaise/fatigue.  HENT:  Positive for sore throat.   Respiratory:  Negative for cough and shortness of breath.   Skin:  Negative for rash.     Allergies  Allergen Reactions   Codeine  Nausea Only    Past Medical History:  Diagnosis Date   Allergic rhinitis, cause unspecified    Anemia, unspecified    Carpal tunnel syndrome    Cystitis, unspecified    Family history of osteoporosis    PONV (postoperative nausea and vomiting)    Postnasal drip    Syncope and collapse     Past Surgical History:  Procedure Laterality Date   BONE MARROW BIOPSY     CARPAL TUNNEL RELEASE     COLONOSCOPY  09/28/2003   COLONOSCOPY WITH PROPOFOL N/A 07/31/2015   Procedure: COLONOSCOPY WITH PROPOFOL;  Surgeon: Ronald Lobo, MD;  Location: [facility];  Service: Endoscopy;   Laterality: N/A;   DIAGNOSTIC LAPAROSCOPY     dx lack of pregnancy    EYE SURGERY     lasik, cataract, prk,yag procedure    Social History   Socioeconomic History   Marital status: Married    Spouse name: Not on file   Number of children: Not on file   Years of education: Not on file   Highest education level: Not on file  Occupational History   Not on file  Tobacco Use   Smoking status: Never   Smokeless tobacco: Never  Vaping Use   Vaping Use: Never used  Substance and Sexual Activity   Alcohol use: Yes    Alcohol/week: 0.0 standard drinks of alcohol    Comment: Rare   Drug use: No   Sexual activity: Not Currently  Other Topics Concern   Not on file  Social History Narrative   No regular exercise      Drinks lots of [employer]eps[employer]         Social Determinants of Health   Financial Resource Strain: Low Risk  (05/20/2022)   Overall Financial Resource Strain (CARDIA)    Difficulty of Paying Living Expenses: Not very hard  Food Insecurity: No Food Insecurity (05/20/2022)   Hunger Vital Sign    Worried About Running Out of Food in the Last Year: Never true    Ran Out of Food in the Last Year: Never true  Transportation Needs: No Transportation Needs (05/20/2022)   PRAPARE - Hydrologist (Medical): No    Lack of Transportation (Non-Medical): No  Physical Activity: Inactive (05/20/2022)   Exercise Vital Sign    Days of Exercise per Week: 0 days    Minutes of Exercise per Session: 0 min  Stress: No Stress Concern Present (05/20/2022)   [place]    Feeling of Stress : Only a little  Social Connections: Moderately Integrated (05/20/2022)   Social Connection and Isolation Panel [NHANES]    Frequency of Communication with Friends and Family: More than three times a week    Frequency of Social Gatherings with Friends and Family: Once a week    Attends Religious Services: More than 4 times  per year    Active Member of [employer] or Organizations: No    Attends Archivist Meetings: Never    Marital Status: Married  Human resources officer Violence: Not At Risk (05/20/2022)   Humiliation, Afraid, Rape, and Kick questionnaire    Fear of Current or Ex-Partner: No    Emotionally Abused: No    Physically Abused: No    Sexually Abused: No    Immunization History  Administered Date(s) Administered   Fluad Quad(high Dose 65+) 07/05/2019, 06/24/2020, 07/07/2021   Influenza Split 06/22/2011   Influenza Whole 06/26/2008, 07/15/2009,  08/17/2012   Influenza, High Dose Seasonal PF 07/13/2015, 06/27/2017   Influenza,inj,Quad PF,6+ Mos 07/18/2013   Influenza-Unspecified 07/11/2014, 07/09/2016   PFIZER(Purple Top)SARS-COV-2 Vaccination 11/03/2019, 11/24/2019   Pneumococcal Conjugate-13 10/10/2015   Pneumococcal Polysaccharide-23 12/14/2016   Tdap 06/22/2011   Zoster Recombinat (Shingrix) 07/31/2019, 12/19/2019   Zoster, Live 07/05/2012    Family History  Problem Relation Age of Onset   Osteoporosis Mother    Stroke Mother    Coronary artery disease Father    Stroke Father 71   Diabetes Other        Aunts and uncles   Breast cancer Paternal Aunt    Breast cancer Maternal Aunt    Arthritis/Rheumatoid Child    Breast cancer Cousin      Current Outpatient Medications:    acetaminophen (TYLENOL) 500 MG tablet, Take 500-1,000 mg by mouth every 6 (six) hours as needed for mild pain or headache., Disp: , Rfl:    aspirin EC 81 MG tablet, Take 81 mg by mouth at bedtime. Swallow whole. , Disp: , Rfl:    Biotin 10 MG CAPS, Take by mouth., Disp: , Rfl:    Calcium Carb-Cholecalciferol (CALCIUM-VITAMIN D3) 600-10 MG-MCG CAPS, Take by mouth in the morning and at bedtime. Taking twice dailly, Disp: , Rfl:    Darbepoetin Alfa (ARANESP) 500 MCG/ML SOSY injection, Inject 500 mcg into the skin See admin instructions. Inject 500 mcg into the skin every week, Disp: , Rfl:    ergocalciferol  (VITAMIN D2) 1.25 MG (50000 UT) capsule, Take 1 capsule (50,000 Units total) by mouth once a week., Disp: 12 capsule, Rfl: 0   levothyroxine (SYNTHROID) 25 MCG tablet, TAKE 1 TABLET BY MOUTH ONCE A DAY BEFOREBREAKFAST., Disp: 90 tablet, Rfl: 3   ondansetron (ZOFRAN ODT) 8 MG disintegrating tablet, Take 1 tablet (8 mg total) by mouth every 8 (eight) hours as needed for nausea or vomiting., Disp: 30 tablet, Rfl: 0   VITAMIN D PO, Take 2,000 Units by mouth daily., Disp: , Rfl:   Physical exam: Exam limited due to telemedicine Physical Exam Constitutional:      General: She is not in acute distress.    Appearance: Normal appearance. She is not ill-appearing, toxic-appearing or diaphoretic.  HENT:     Head: Normocephalic.     Nose: Nose normal.  Musculoskeletal:     Cervical back: Normal range of motion and neck supple. No rigidity.  Neurological:     Mental Status: She is alert.     Assessment and plan- Patient is a 71 y.o. female    Visit Diagnosis 1. Sore throat    Sore Throat: New. Has had symptoms for about 1 week. Likely viral however If symptoms fail to resolve within another week further imaging and work up suggested. She has an appointment with [place] here tomorrow for in person assessment and labs. She is not likely contagious but I have advised her to wear a mask if at all possible to which she is agreeable. I will also alert Dr. Rogue Bussing who is seeing her tomorrow.     Patient expressed understanding and was in agreement with this plan. She also understands that She can call clinic at any time with any questions, concerns, or complaints.   I discussed the assessment and treatment plan with the patient. The patient was provided an opportunity to ask questions and all were answered. The patient agreed with the plan and demonstrated an understanding of the instructions.   The patient was advised to call  back or seek an in-person evaluation if the symptoms worsen or if the condition  fails to improve as anticipated.   I spent 10 minutes face-to-face video visit time dedicated to the care of this patient on the date of this encounter to include pre-visit review of past visits with our office, face-to-face time with the patient, and post visit ordering of testing/documentation.    Thank you for allowing me to participate in the care of this very pleasant patient.   Nelwyn Salisbury [name]

## 2022-08-24 ENCOUNTER — Inpatient Hospital Stay: Payer: PPO

## 2022-08-24 ENCOUNTER — Encounter: Payer: Self-pay | Admitting: Internal Medicine

## 2022-08-24 ENCOUNTER — Inpatient Hospital Stay (HOSPITAL_BASED_OUTPATIENT_CLINIC_OR_DEPARTMENT_OTHER): Payer: PPO | Admitting: Internal Medicine

## 2022-08-24 VITALS — BP 122/63 | HR 64 | Temp 96.6°F | Resp 20 | Wt 148.7 lb

## 2022-08-24 DIAGNOSIS — D46Z Other myelodysplastic syndromes: Secondary | ICD-10-CM

## 2022-08-24 DIAGNOSIS — D461 Refractory anemia with ring sideroblasts: Secondary | ICD-10-CM | POA: Diagnosis not present

## 2022-08-24 LAB — CBC WITH DIFFERENTIAL/PLATELET
Abs Immature Granulocytes: 0.81 10*3/uL — ABNORMAL HIGH (ref 0.00–0.07)
Basophils Absolute: 0.1 10*3/uL (ref 0.0–0.1)
Basophils Relative: 1 %
Eosinophils Absolute: 0.1 10*3/uL (ref 0.0–0.5)
Eosinophils Relative: 1 %
HCT: 31.7 % — ABNORMAL LOW (ref 36.0–46.0)
Hemoglobin: 10.2 g/dL — ABNORMAL LOW (ref 12.0–15.0)
Immature Granulocytes: 9 %
Lymphocytes Relative: 23 %
Lymphs Abs: 2.2 10*3/uL (ref 0.7–4.0)
MCH: 33.6 pg (ref 26.0–34.0)
MCHC: 32.2 g/dL (ref 30.0–36.0)
MCV: 104.3 fL — ABNORMAL HIGH (ref 80.0–100.0)
Monocytes Absolute: 0.9 10*3/uL (ref 0.1–1.0)
Monocytes Relative: 10 %
Neutro Abs: 5.3 10*3/uL (ref 1.7–7.7)
Neutrophils Relative %: 56 %
Platelets: 266 10*3/uL (ref 150–400)
RBC: 3.04 MIL/uL — ABNORMAL LOW (ref 3.87–5.11)
RDW: 29 % — ABNORMAL HIGH (ref 11.5–15.5)
Smear Review: NORMAL
WBC: 9.4 10*3/uL (ref 4.0–10.5)
nRBC: 0.7 % — ABNORMAL HIGH (ref 0.0–0.2)

## 2022-08-24 LAB — COMPREHENSIVE METABOLIC PANEL
ALT: 15 U/L (ref 0–44)
AST: 25 U/L (ref 15–41)
Albumin: 4.2 g/dL (ref 3.5–5.0)
Alkaline Phosphatase: 50 U/L (ref 38–126)
Anion gap: 9 (ref 5–15)
BUN: 20 mg/dL (ref 8–23)
CO2: 22 mmol/L (ref 22–32)
Calcium: 9.3 mg/dL (ref 8.9–10.3)
Chloride: 104 mmol/L (ref 98–111)
Creatinine, Ser: 0.96 mg/dL (ref 0.44–1.00)
GFR, Estimated: >60 mL/min (ref >60)
Glucose, Bld: 145 mg/dL — ABNORMAL HIGH (ref 70–99)
Potassium: 3.9 mmol/L (ref 3.5–5.1)
Sodium: 135 mmol/L (ref 135–145)
Total Bilirubin: 0.6 mg/dL (ref 0.3–1.2)
Total Protein: 8.2 g/dL — ABNORMAL HIGH (ref 6.5–8.1)

## 2022-08-24 LAB — LACTATE DEHYDROGENASE: LDH: 148 U/L (ref 98–192)

## 2022-08-24 MED ORDER — LUSPATERCEPT-AAMT 75 MG ~~LOC~~ SOLR
1.0000 mg/kg | Freq: Once | SUBCUTANEOUS | Status: AC
  Administered 2022-08-24: 65 mg via SUBCUTANEOUS
  Filled 2022-08-24: qty 1.3

## 2022-08-24 NOTE — Assessment & Plan Note (Addendum)
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 MARCH 2023-bone marrow biopsy no evidence of obvious progression of MDS or any acute process.  Currently on Aranesp 100 mcg every 2-3 week plus Luspatercept  # Continue with Luspatercept #5  today. HOLD Aranesp today- Hb 10.2 today- STABLE.   # sore throat/swelling in neck; no cough; no fevers; no dyspnea- home COVID test negative- HOLD off antibiotics. Recommend Tylenol prn; and salt water gargles. Call us if not improved by the end of the week   # Vaccination: s/p flu shot recommended. Consider/recommend COVID/ RSV  # DISPOSITION: # proceed with  Luspatecept  Today; HOLD Aranesp today  # follow up in 3 weeks--APP-  labs CBC/cmp;:LDH; HOLD tube;  possible aranesp; Luspatercept  SQ;  Dr.B

## 2022-08-24 NOTE — Progress Notes (Signed)
Patient states she is having some swelling in her lymph nodes in her neck. Patient states she has a  little sore throat but not to bad. All this started a little over a week ago. Patient denies fever,chills, or any other symptoms.

## 2022-08-24 NOTE — Progress Notes (Signed)
[facility] CONSULT NOTE  Patient Care Team: [name], [name], MD as PCP - General Rogue Bussing, [name], MD as Consulting Physician (Hematology and Oncology)  CHIEF COMPLAINTS/PURPOSE OF CONSULTATION: MDS   Oncology History Overview Note   # July 2020-myelodysplastic syndrome with ringed sideroblasts-Normal karyotype; no blasts [IPSS R-Very low risk ~median survival 8.3 years]; iron studies A21 folic acid myeloma panel normal; pyridoxine levels/copper/zinc-WNL. Erythropoietin levels-60. II OPINION at [facility] [Dr.DeCastro]  # [facility]/ NGS: TET2([id number])c.2524delT(p.Ser842GlnfsTer31) Exon 3 frame-shift SF3B1([id number])c.2098A>G(p.Lys700Glu) Exon 15 missense  DNMT3A([id number])c.2204A>G(p.Tyr735Cys) Exon 19 missense   # JAN 11th 2020- Aranesp/retacrit;  # July 30th, 2021- START REVLIMID 10 mg/day  [SF3B55mtation]  MARCH 2023- BONE MARROW, ASPIRATE, CLOT, CORE:  -  Hypercellular marrow involved by myelodysplastic syndrome with ring  sideroblasts (MDS-RS)  -  Polytypic plasmacytosis   # MARCh 2023-increase the frequency of Aranesp 400 mcg every 2 weeks; continue Revlimid.   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; September 2021 ejection fraction 60 to 65%;   # colonoscopy- 2016 [Dr.Bucinni];  DIAGNOSIS: MDS/low-grade with ringed sideroblasts  STAGE: Low       ;  GOALS: Control  CURRENT/MOST RECENT THERAPY : EPO agent+ REV    [place] ([place]el[place] grade [place]  04/23/2019 Initial Diagnosis   [place] ([place]el[place]ysplastic syndrome), low grade ([place]   05/11/2022 - 05/11/2022 Chemotherapy   Patient is on Treatment Plan : MYELODYSPLASIA Luspatercept q21d     05/11/2022 -  Chemotherapy   Patient is on Treatment Plan : MYELODYSPLASIA Luspatercept q21d       HISTORY OF PRESENTING ILLNESS: Accompanied by husband ambulating independently.  Carla OrsbornMay 71 y.o.  female history of low-grade MDS  anemia with ring sideroblasts currently on Aranesp/Luspatercept is here for follow-up.    In the interim patient was evaluated for sore throat in the symptom management clinic virtual yesterday.  Patient denies any worsening cough.  Denies any worsening fevers or chills.   No worsening fatigue. No nausea no vomiting.  Denies any diarrhea. Mild joint pain- not any significantly worse.   Review of Systems  Constitutional:  Positive for malaise/fatigue. Negative for chills, diaphoresis and fever.  HENT:  Negative for nosebleeds and sore throat.   Eyes:  Negative for double vision.  Respiratory:  Negative for cough, hemoptysis, sputum production, shortness of breath and wheezing.   Cardiovascular:  Negative for chest pain and orthopnea.  Gastrointestinal:  Negative for abdominal pain, blood in stool, constipation, diarrhea, heartburn, melena and vomiting.  Genitourinary:  Negative for dysuria, frequency and urgency.  Musculoskeletal:  Positive for back pain and joint pain.  Skin: Negative.  Negative for itching and rash.  Neurological:  Negative for dizziness, tingling, focal weakness, weakness and headaches.  Endo/Heme/Allergies:  Does not bruise/bleed easily.  Psychiatric/Behavioral:  Negative for depression. The patient is not nervous/anxious and does not have insomnia.     MEDICAL HISTORY:  Past Medical History:  Diagnosis Date   Allergic rhinitis, cause unspecified    Anemia, unspecified    Carpal tunnel syndrome    Cystitis, unspecified    Family history of osteoporosis    PONV (postoperative nausea and vomiting)    Postnasal drip    Syncope and collapse     SURGICAL HISTORY: Past Surgical History:  Procedure Laterality Date   BONE MARROW BIOPSY     CARPAL TUNNEL RELEASE     COLONOSCOPY  09/28/2003   COLONOSCOPY WITH PROPOFOL N/A 07/31/2015  Procedure: COLONOSCOPY WITH PROPOFOL;  Surgeon: Ronald Lobo, MD;  Location: [facility];  Service: Endoscopy;  Laterality: N/A;    DIAGNOSTIC LAPAROSCOPY     dx lack of pregnancy    EYE SURGERY     lasik, cataract, prk,yag procedure    SOCIAL HISTORY: Social History   Socioeconomic History   Marital status: Married    Spouse name: Not on file   Number of children: Not on file   Years of education: Not on file   Highest education level: Not on file  Occupational History   Not on file  Tobacco Use   Smoking status: Never   Smokeless tobacco: Never  Vaping Use   Vaping Use: Never used  Substance and Sexual Activity   Alcohol use: Yes    Alcohol/week: 0.0 standard drinks of alcohol    Comment: Rare   Drug use: No   Sexual activity: Not Currently  Other Topics Concern   Not on file  Social History Narrative   No regular exercise      Drinks lots of [employer]         Social Determinants of Health   Financial Resource Strain: Low Risk  (05/20/2022)   Overall Financial Resource Strain (CARDIA)    Difficulty of Paying Living Expenses: Not very hard  Food Insecurity: No Food Insecurity (05/20/2022)   Hunger Vital Sign    Worried About Running Out of Food in the Last Year: Never true    Ran Out of Food in the Last Year: Never true  Transportation Needs: No Transportation Needs (05/20/2022)   PRAPARE - Hydrologist (Medical): No    Lack of Transportation (Non-Medical): No  Physical Activity: Inactive (05/20/2022)   Exercise Vital Sign    Days of Exercise per Week: 0 days    Minutes of Exercise per Session: 0 min  Stress: No Stress Concern Present (05/20/2022)   [place]    Feeling of Stress : Only a little  Social Connections: Moderately Integrated (05/20/2022)   Social Connection and Isolation Panel [NHANES]    Frequency of Communication with Friends and Family: More than three times a week    Frequency of Social Gatherings with Friends and Family: Once a week    Attends Religious Services: More than 4 times  per year    Active Member of [employer] or Organizations: No    Attends Archivist Meetings: Never    Marital Status: Married  Human resources officer Violence: Not At Risk (05/20/2022)   Humiliation, Afraid, Rape, and Kick questionnaire    Fear of Current or Ex-Partner: No    Emotionally Abused: No    Physically Abused: No    Sexually Abused: No    FAMILY HISTORY: Family History  Problem Relation Age of Onset   Osteoporosis Mother    Stroke Mother    Coronary artery disease Father    Stroke Father 35   Diabetes Other        Aunts and uncles   Breast cancer Paternal Aunt    Breast cancer Maternal Aunt    Arthritis/Rheumatoid Child    Breast cancer Cousin     ALLERGIES:  is allergic to codeine.  MEDICATIONS:  Current Outpatient Medications  Medication Sig Dispense Refill   acetaminophen (TYLENOL) 500 MG tablet Take 500-1,000 mg by mouth every 6 (six) hours as needed for mild pain or headache.     aspirin EC 81 MG  tablet Take 81 mg by mouth at bedtime. Swallow whole.      Biotin 10 MG CAPS Take by mouth.     Calcium Carb-Cholecalciferol (CALCIUM-VITAMIN D3) 600-10 MG-MCG CAPS Take by mouth in the morning and at bedtime. Taking twice dailly     Darbepoetin Alfa (ARANESP) 500 MCG/ML SOSY injection Inject 500 mcg into the skin See admin instructions. Inject 500 mcg into the skin every week     ergocalciferol (VITAMIN D2) 1.25 MG (50000 UT) capsule Take 1 capsule (50,000 Units total) by mouth once a week. 12 capsule 0   levothyroxine (SYNTHROID) 25 MCG tablet TAKE 1 TABLET BY MOUTH ONCE A DAY BEFOREBREAKFAST. 90 tablet 3   ondansetron (ZOFRAN ODT) 8 MG disintegrating tablet Take 1 tablet (8 mg total) by mouth every 8 (eight) hours as needed for nausea or vomiting. 30 tablet 0   VITAMIN D PO Take 2,000 Units by mouth daily.     No current facility-administered medications for this visit.   Facility-Administered Medications Ordered in Other Visits  Medication Dose Route Frequency  Provider Last Rate Last Admin   luspatercept-aamt (REBLOZYL) subcutaneous injection 65 mg  1 mg/kg (Treatment Plan Recorded) Subcutaneous Once Cammie Sickle, MD          PHYSICAL EXAMINATION:   Vitals:   08/24/22 1052  BP: 122/63  Pulse: 64  Resp: 20  Temp: (!) 96.6 F (35.9 C)  SpO2: 100%     Filed Weights   08/24/22 1052  Weight: 148 lb 11.2 oz (67.4 kg)      Physical Exam HENT:     Head: Normocephalic and atraumatic.     Mouth/Throat:     Pharynx: No oropharyngeal exudate.  Eyes:     Pupils: Pupils are equal, round, and reactive to light.  Cardiovascular:     Rate and Rhythm: Normal rate and regular rhythm.     Pulses: Normal pulses.     Heart sounds: Normal heart sounds.  Pulmonary:     Effort: Pulmonary effort is normal. No respiratory distress.     Breath sounds: Normal breath sounds. No wheezing.  Abdominal:     General: Bowel sounds are normal. There is no distension.     Palpations: Abdomen is soft. There is no mass.     Tenderness: There is no abdominal tenderness. There is no guarding or rebound.  Musculoskeletal:        General: No tenderness. Normal range of motion.     Cervical back: Normal range of motion and neck supple.  Skin:    General: Skin is warm.  Neurological:     Mental Status: She is alert and oriented to person, place, and time.  Psychiatric:        Mood and Affect: Affect normal.     LABORATORY DATA:  I have reviewed the data as listed Lab Results  Component Value Date   WBC 9.4 08/24/2022   HGB 10.2 (L) 08/24/2022   HCT 31.7 (L) 08/24/2022   MCV 104.3 (H) 08/24/2022   PLT 266 08/24/2022   Recent Labs    07/14/22 0938 08/03/22 0946 08/24/22 1015  NA 139 136 135  K 4.2 4.4 3.9  CL 106 104 104  CO2 _0 GLUCOSE 127* 101* 145*  BUN _1 CREATININE 1.03* 0.96 0.96  CALCIUM 9.2 9.1 9.3  GFRNONAA 58* >60 >60  PROT 7.8 8.0 8.2*  ALBUMIN 4.2 4.3 4.2  AST _2 ALT 16  16 15  ALKPHOS 46 51 50   BILITOT 1.0 0.9 0.6     No results found.  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 POSITIVE  For SF3B1; R-IPSS-low risk. MARCH 2023-bone marrow biopsy no evidence of obvious progression of MDS or any acute process.  Currently on Aranesp 100 mcg every 2-3 week plus Luspatercept  # Continue with Luspatercept #5  today. HOLD Aranesp today- Hb 10.2 today- STABLE.   # sore throat/swelling in neck; no cough; no fevers; no dyspnea- home COVID test negative- HOLD off antibiotics. Recommend Tylenol prn; and salt water gargles. Call us if not improved by the end of the week   # Vaccination: s/p flu shot recommended. Consider/recommend COVID/ RSV  # DISPOSITION: # proceed with  Luspatecept  Today; HOLD Aranesp today  # follow up in 3 weeks--APP-  labs CBC/cmp;:LDH; HOLD tube;  possible aranesp; Luspatercept  SQ;  Dr.B  All questions were answered. The patient knows to call the clinic with any problems, questions or concerns.   Cammie Sickle, MD 08/24/2022 11:55 AM

## 2022-08-25 LAB — SAMPLE TO BLOOD BANK

## 2022-09-14 ENCOUNTER — Inpatient Hospital Stay: Payer: PPO

## 2022-09-14 ENCOUNTER — Inpatient Hospital Stay: Payer: PPO | Attending: Internal Medicine

## 2022-09-14 ENCOUNTER — Inpatient Hospital Stay (HOSPITAL_BASED_OUTPATIENT_CLINIC_OR_DEPARTMENT_OTHER): Payer: PPO | Admitting: Nurse Practitioner

## 2022-09-14 ENCOUNTER — Encounter: Payer: Self-pay | Admitting: Nurse Practitioner

## 2022-09-14 DIAGNOSIS — D461 Refractory anemia with ring sideroblasts: Secondary | ICD-10-CM | POA: Insufficient documentation

## 2022-09-14 DIAGNOSIS — Z803 Family history of malignant neoplasm of breast: Secondary | ICD-10-CM | POA: Diagnosis not present

## 2022-09-14 DIAGNOSIS — D46Z Other myelodysplastic syndromes: Secondary | ICD-10-CM | POA: Diagnosis not present

## 2022-09-14 DIAGNOSIS — G2581 Restless legs syndrome: Secondary | ICD-10-CM | POA: Insufficient documentation

## 2022-09-14 LAB — CBC WITH DIFFERENTIAL/PLATELET
Abs Immature Granulocytes: 0.16 10*3/uL — ABNORMAL HIGH (ref 0.00–0.07)
Basophils Absolute: 0.1 10*3/uL (ref 0.0–0.1)
Basophils Relative: 1 %
Eosinophils Absolute: 0.1 10*3/uL (ref 0.0–0.5)
Eosinophils Relative: 1 %
HCT: 29.3 % — ABNORMAL LOW (ref 36.0–46.0)
Hemoglobin: 9.4 g/dL — ABNORMAL LOW (ref 12.0–15.0)
Immature Granulocytes: 2 %
Lymphocytes Relative: 29 %
Lymphs Abs: 2.2 10*3/uL (ref 0.7–4.0)
MCH: 32.1 pg (ref 26.0–34.0)
MCHC: 32.1 g/dL (ref 30.0–36.0)
MCV: 100 fL (ref 80.0–100.0)
Monocytes Absolute: 1 10*3/uL (ref 0.1–1.0)
Monocytes Relative: 13 %
Neutro Abs: 4.1 10*3/uL (ref 1.7–7.7)
Neutrophils Relative %: 54 %
Platelets: 263 10*3/uL (ref 150–400)
RBC: 2.93 MIL/uL — ABNORMAL LOW (ref 3.87–5.11)
RDW: 28.5 % — ABNORMAL HIGH (ref 11.5–15.5)
WBC: 7.6 10*3/uL (ref 4.0–10.5)
nRBC: 1.1 % — ABNORMAL HIGH (ref 0.0–0.2)

## 2022-09-14 LAB — COMPREHENSIVE METABOLIC PANEL
ALT: 14 U/L (ref 0–44)
AST: 20 U/L (ref 15–41)
Albumin: 4.2 g/dL (ref 3.5–5.0)
Alkaline Phosphatase: 44 U/L (ref 38–126)
Anion gap: 8 (ref 5–15)
BUN: 24 mg/dL — ABNORMAL HIGH (ref 8–23)
CO2: 27 mmol/L (ref 22–32)
Calcium: 9.3 mg/dL (ref 8.9–10.3)
Chloride: 104 mmol/L (ref 98–111)
Creatinine, Ser: 1.17 mg/dL — ABNORMAL HIGH (ref 0.44–1.00)
GFR, Estimated: 50 mL/min — ABNORMAL LOW (ref >60)
Glucose, Bld: 114 mg/dL — ABNORMAL HIGH (ref 70–99)
Potassium: 4.1 mmol/L (ref 3.5–5.1)
Sodium: 139 mmol/L (ref 135–145)
Total Bilirubin: 0.9 mg/dL (ref 0.3–1.2)
Total Protein: 8.7 g/dL — ABNORMAL HIGH (ref 6.5–8.1)

## 2022-09-14 LAB — LACTATE DEHYDROGENASE: LDH: 111 U/L (ref 98–192)

## 2022-09-14 LAB — SAMPLE TO BLOOD BANK

## 2022-09-14 MED ORDER — DARBEPOETIN ALFA 500 MCG/ML IJ SOSY
500.0000 ug | PREFILLED_SYRINGE | Freq: Once | INTRAMUSCULAR | Status: AC
  Administered 2022-09-14: 500 ug via SUBCUTANEOUS
  Filled 2022-09-14: qty 1

## 2022-09-14 MED ORDER — LUSPATERCEPT-AAMT 75 MG ~~LOC~~ SOLR
1.0000 mg/kg | Freq: Once | SUBCUTANEOUS | Status: AC
  Administered 2022-09-14: 65 mg via SUBCUTANEOUS
  Filled 2022-09-14: qty 1.3

## 2022-09-14 NOTE — Progress Notes (Signed)
[facility] CONSULT NOTE  Patient Care Team: [name], [name], MD as PCP - General Rogue Bussing, [name], MD as Consulting Physician (Hematology and Oncology)  CHIEF COMPLAINTS/PURPOSE OF CONSULTATION: MDS   Oncology History Overview Note   # July 2020-myelodysplastic syndrome with ringed sideroblasts-Normal karyotype; no blasts [IPSS R-Very low risk ~median survival 8.3 years]; iron studies E08 folic acid myeloma panel normal; pyridoxine levels/copper/zinc-WNL. Erythropoietin levels-60. 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; September 2021 ejection fraction 60 to 65%;   # colonoscopy- 2016 [Dr.Bucinni];  DIAGNOSIS: MDS/low-grade with ringed sideroblasts  STAGE: Low       ;  GOALS: Control  CURRENT/MOST RECENT THERAPY : EPO agent+ REV    MDS (myelodysplastic syndrome), low grade ([facility]  04/23/2019 Initial Diagnosis   MDS (myelodysplastic syndrome), low grade ([facility]   05/11/2022 - 05/11/2022 Chemotherapy   Patient is on Treatment Plan : MYELODYSPLASIA Luspatercept q21d     05/11/2022 -  Chemotherapy   Patient is on Treatment Plan : MYELODYSPLASIA Luspatercept q21d       HISTORY OF PRESENTING ILLNESS: Accompanied by husband ambulating independently.  Carla FiserMay 71 y.o. female history of low-grade MDS anemia  with ring sideroblasts currently on Aranesp/Luspatercept is here for follow-up.  Sore throat has resolved. Endorses aches and pains intermittently and restless leg at night. Otherwise feels at baseline. Fatigue is stable. No nausea or vomiting. No fevers or chills.   Review of Systems  Constitutional:  Positive for malaise/fatigue. Negative for chills, diaphoresis and fever.  HENT:  Negative for nosebleeds and sore throat.   Eyes:  Negative for double vision.  Respiratory:  Negative for cough, hemoptysis, sputum production, shortness of breath and wheezing.   Cardiovascular:  Negative for chest pain and orthopnea.  Gastrointestinal:  Negative for abdominal pain, blood in stool, constipation, diarrhea, heartburn, melena and vomiting.  Genitourinary:  Negative for dysuria, frequency and urgency.  Musculoskeletal:  Positive for back pain and joint pain. Negative for falls, myalgias and neck pain.  Skin:  Negative for itching and rash.  Neurological:  Negative for dizziness, tingling, focal weakness, weakness and headaches.  Endo/Heme/Allergies:  Does not bruise/bleed easily.  Psychiatric/Behavioral:  Negative for depression. The patient is not nervous/anxious and does not have insomnia.     MEDICAL HISTORY:  Past Medical History:  Diagnosis Date   Allergic rhinitis, cause unspecified    Anemia, unspecified    Carpal tunnel syndrome    Cystitis, unspecified    Family history of osteoporosis    PONV (postoperative nausea and vomiting)    Postnasal drip    Syncope and collapse     SURGICAL HISTORY: Past Surgical History:  Procedure Laterality Date   BONE MARROW BIOPSY     CARPAL TUNNEL RELEASE     COLONOSCOPY  09/28/2003   COLONOSCOPY WITH PROPOFOL N/A 07/31/2015   Procedure: COLONOSCOPY WITH PROPOFOL;  Surgeon: RRonald Lobo MD;  [facility];  Service: Endoscopy;  Laterality: N/A;   DIAGNOSTIC LAPAROSCOPY     dx lack of pregnancy    EYE SURGERY     lasik, cataract,  prk,yag procedure    SOCIAL HISTORY: Social History   Socioeconomic History   Marital status: Married    Spouse name: Not on file   Number of children: Not on file   Years of education: Not on file   Highest education level: Not on file  Occupational History   Not on file  Tobacco Use   Smoking status: Never   Smokeless tobacco: Never  Vaping Use   Vaping Use: Never used  Substance and Sexual Activity   Alcohol use: Yes    Alcohol/week: 0.0 standard drinks of alcohol    Comment: Rare   Drug use: No   Sexual activity: Not Currently  Other Topics Concern   Not on file  Social History Narrative   No regular exercise      Drinks lots of [employer]         Social Determinants of Health   Financial Resource Strain: Low Risk  (05/20/2022)   Overall Financial Resource Strain (CARDIA)    Difficulty of Paying Living Expenses: Not very hard  Food Insecurity: No Food Insecurity (05/20/2022)   Hunger Vital Sign    Worried About Running Out of Food in the Last Year: Never true    Ran Out of Food in the Last Year: Never true  Transportation Needs: No Transportation Needs (05/20/2022)   PRAPARE - Hydrologist (Medical): No    Lack of Transportation (Non-Medical): No  Physical Activity: Inactive (05/20/2022)   Exercise Vital Sign    Days of Exercise per Week: 0 days    Minutes of Exercise per Session: 0 min  Stress: No Stress Concern Present (05/20/2022)   [place]    Feeling of Stress : Only a little  Social Connections: Moderately Integrated (05/20/2022)   Social Connection and Isolation Panel [NHANES]    Frequency of Communication with Friends and Family: More than three times a week    Frequency of Social Gatherings with Friends and Family: Once a week    Attends Religious Services: More than 4 times per year    Active Member of [employer] or Organizations: No    Attends Archivist  Meetings: Never    Marital Status: Married  Human resources officer Violence: Not At Risk (05/20/2022)   Humiliation, Afraid, Rape, and Kick questionnaire    Fear of Current or Ex-Partner: No    Emotionally Abused: No    Physically Abused: No    Sexually Abused: No    FAMILY HISTORY: Family History  Problem Relation Age of Onset   Osteoporosis Mother    Stroke Mother    Coronary artery disease Father    Stroke Father 70   Diabetes Other        Aunts and uncles   Breast cancer Paternal Aunt    Breast cancer Maternal Aunt    Arthritis/Rheumatoid Child    Breast cancer Cousin     ALLERGIES:  is allergic to codeine.  MEDICATIONS:  Current Outpatient Medications  Medication Sig Dispense Refill   acetaminophen (TYLENOL) 500 MG tablet Take 500-1,000 mg by mouth every 6 (six) hours as needed for mild pain or headache.     aspirin EC 81 MG tablet Take 81 mg by mouth at bedtime. Swallow whole.      Biotin 10  MG CAPS Take by mouth.     Calcium Carb-Cholecalciferol (CALCIUM-VITAMIN D3) 600-10 MG-MCG CAPS Take by mouth in the morning and at bedtime. Taking twice dailly     Darbepoetin Alfa (ARANESP) 500 MCG/ML SOSY injection Inject 500 mcg into the skin See admin instructions. Inject 500 mcg into the skin every week     ergocalciferol (VITAMIN D2) 1.25 MG (50000 UT) capsule Take 1 capsule (50,000 Units total) by mouth once a week. 12 capsule 0   levothyroxine (SYNTHROID) 25 MCG tablet TAKE 1 TABLET BY MOUTH ONCE A DAY BEFOREBREAKFAST. 90 tablet 3   ondansetron (ZOFRAN ODT) 8 MG disintegrating tablet Take 1 tablet (8 mg total) by mouth every 8 (eight) hours as needed for nausea or vomiting. 30 tablet 0   VITAMIN D PO Take 2,000 Units by mouth daily.     No current facility-administered medications for this visit.   Facility-Administered Medications Ordered in Other Visits  Medication Dose Route Frequency Provider Last Rate Last Admin   Darbepoetin Alfa (ARANESP) injection 500 mcg  500 mcg  Subcutaneous Once Charlaine Dalton R, MD         PHYSICAL EXAMINATION: Vitals:   09/14/22 1351  BP: 127/62  Pulse: 70  Temp: (!) 97.3 F (36.3 C)  SpO2: 98%   Filed Weights   09/14/22 1351  Weight: 148 lb (67.1 kg)   Physical Exam HENT:     Head: Normocephalic and atraumatic.     Mouth/Throat:     Pharynx: No oropharyngeal exudate.  Eyes:     Pupils: Pupils are equal, round, and reactive to light.  Cardiovascular:     Rate and Rhythm: Normal rate and regular rhythm.     Pulses: Normal pulses.     Heart sounds: Normal heart sounds.  Pulmonary:     Effort: Pulmonary effort is normal. No respiratory distress.     Breath sounds: Normal breath sounds. No wheezing.  Abdominal:     General: Bowel sounds are normal. There is no distension.     Palpations: Abdomen is soft. There is no mass.     Tenderness: There is no abdominal tenderness. There is no guarding or rebound.  Musculoskeletal:        General: No tenderness. Normal range of motion.     Cervical back: Normal range of motion and neck supple.  Skin:    General: Skin is warm.  Neurological:     Mental Status: She is alert and oriented to person, place, and time.  Psychiatric:        Mood and Affect: Affect normal.    LABORATORY DATA:  I have reviewed the data as listed Lab Results  Component Value Date   WBC 7.6 09/14/2022   HGB 9.4 (L) 09/14/2022   HCT 29.3 (L) 09/14/2022   MCV 100.0 09/14/2022   PLT 263 09/14/2022   Recent Labs    08/03/22 0946 08/24/22 1015 09/14/22 1332  NA 136 135 139  K 4.4 3.9 4.1  CL 104 104 104  CO2 _0 GLUCOSE 101* 145* 114*  BUN 19 20 24*  CREATININE 0.96 0.96 1.17*  CALCIUM 9.1 9.3 9.3  GFRNONAA >60 >60 50*  PROT 8.0 8.2* 8.7*  ALBUMIN 4.3 4.2 4.2  AST _1 ALT _2 ALKPHOS 51 50 44  BILITOT 0.9 0.6 0.9    No results found.  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 POSITIVE  For  SF3B1; R-IPSS-low risk. MARCH 2023-bone marrow biopsy no evidence of obvious progression of MDS or any acute process.  Currently on Aranesp 100 mcg every 2-3 week plus Luspatercept   # Continue with Luspatercept #7 today. Proceed with Aranesp today- Hb 9.4 today- STABLE.    # Sore throat/swelling in neck- resolved.   # Restless leg/aching- check iron studies at next visit. Modesitt be medication side effect vs other etiologies.    # Vaccination: s/p flu shot recommended. Consider/recommend COVID/ RSV   # DISPOSITION: # proceed with  Luspatecept & Aranesp today.  3 weeks- labs (cbc, cmp, ldh, hold tube), Dr Rogue Bussing, possible aranesp plus luspatercept   No problem-specific Assessment & Plan notes found for this encounter.  All questions were answered. The patient knows to call the clinic with any problems, questions or concerns.   Verlon Au, NP 09/14/2022

## 2022-09-21 ENCOUNTER — Encounter: Admit: 2022-09-21 | Discharge: 2022-09-21 | Payer: MEDICARE

## 2022-09-21 NOTE — Telephone Encounter
Called the patient to change her appointment from 01/10/2023 to 01/17/2023. Patient's voicemail not set up. Will message the patient via mychart.

## 2022-09-21 NOTE — Telephone Encounter
Sent the patient an email informing her that Dr. Theda Sers is not available on 01/10/2023 and I have 01/17/2023 available for a new appointment time. Asked patient if that would work for her.

## 2022-10-04 ENCOUNTER — Other Ambulatory Visit: Payer: Self-pay

## 2022-10-04 DIAGNOSIS — D46Z Other myelodysplastic syndromes: Secondary | ICD-10-CM

## 2022-10-05 ENCOUNTER — Inpatient Hospital Stay: Payer: PPO | Attending: Internal Medicine

## 2022-10-05 ENCOUNTER — Other Ambulatory Visit: Payer: Self-pay

## 2022-10-05 ENCOUNTER — Encounter: Payer: Self-pay | Admitting: Internal Medicine

## 2022-10-05 ENCOUNTER — Inpatient Hospital Stay (HOSPITAL_BASED_OUTPATIENT_CLINIC_OR_DEPARTMENT_OTHER): Payer: PPO | Admitting: Internal Medicine

## 2022-10-05 ENCOUNTER — Inpatient Hospital Stay: Payer: PPO

## 2022-10-05 VITALS — BP 110/74 | HR 71 | Temp 96.5°F | Resp 16 | Wt 148.6 lb

## 2022-10-05 DIAGNOSIS — D461 Refractory anemia with ring sideroblasts: Secondary | ICD-10-CM | POA: Insufficient documentation

## 2022-10-05 DIAGNOSIS — R5383 Other fatigue: Secondary | ICD-10-CM | POA: Diagnosis not present

## 2022-10-05 DIAGNOSIS — E039 Hypothyroidism, unspecified: Secondary | ICD-10-CM | POA: Insufficient documentation

## 2022-10-05 DIAGNOSIS — D46Z Other myelodysplastic syndromes: Secondary | ICD-10-CM

## 2022-10-05 DIAGNOSIS — M25512 Pain in left shoulder: Secondary | ICD-10-CM | POA: Diagnosis not present

## 2022-10-05 DIAGNOSIS — Z803 Family history of malignant neoplasm of breast: Secondary | ICD-10-CM | POA: Insufficient documentation

## 2022-10-05 DIAGNOSIS — N183 Chronic kidney disease, stage 3 unspecified: Secondary | ICD-10-CM | POA: Diagnosis not present

## 2022-10-05 DIAGNOSIS — M25511 Pain in right shoulder: Secondary | ICD-10-CM | POA: Diagnosis not present

## 2022-10-05 DIAGNOSIS — M7918 Myalgia, other site: Secondary | ICD-10-CM

## 2022-10-05 LAB — CBC WITH DIFFERENTIAL/PLATELET
Abs Immature Granulocytes: 0.41 10*3/uL — ABNORMAL HIGH (ref 0.00–0.07)
Basophils Absolute: 0.1 10*3/uL (ref 0.0–0.1)
Basophils Relative: 1 %
Eosinophils Absolute: 0.1 10*3/uL (ref 0.0–0.5)
Eosinophils Relative: 1 %
HCT: 30 % — ABNORMAL LOW (ref 36.0–46.0)
Hemoglobin: 9.8 g/dL — ABNORMAL LOW (ref 12.0–15.0)
Immature Granulocytes: 4 %
Lymphocytes Relative: 19 %
Lymphs Abs: 1.8 10*3/uL (ref 0.7–4.0)
MCH: 32.3 pg (ref 26.0–34.0)
MCHC: 32.7 g/dL (ref 30.0–36.0)
MCV: 99 fL (ref 80.0–100.0)
Monocytes Absolute: 1 10*3/uL (ref 0.1–1.0)
Monocytes Relative: 11 %
Neutro Abs: 6 10*3/uL (ref 1.7–7.7)
Neutrophils Relative %: 64 %
Platelets: 273 10*3/uL (ref 150–400)
RBC: 3.03 MIL/uL — ABNORMAL LOW (ref 3.87–5.11)
RDW: 29.6 % — ABNORMAL HIGH (ref 11.5–15.5)
WBC: 9.4 10*3/uL (ref 4.0–10.5)
nRBC: 1 % — ABNORMAL HIGH (ref 0.0–0.2)

## 2022-10-05 LAB — LACTATE DEHYDROGENASE: LDH: 119 U/L (ref 98–192)

## 2022-10-05 LAB — COMPREHENSIVE METABOLIC PANEL
ALT: 14 U/L (ref 0–44)
AST: 24 U/L (ref 15–41)
Albumin: 4 g/dL (ref 3.5–5.0)
Alkaline Phosphatase: 44 U/L (ref 38–126)
Anion gap: 9 (ref 5–15)
BUN: 25 mg/dL — ABNORMAL HIGH (ref 8–23)
CO2: 24 mmol/L (ref 22–32)
Calcium: 8.9 mg/dL (ref 8.9–10.3)
Chloride: 107 mmol/L (ref 98–111)
Creatinine, Ser: 1.11 mg/dL — ABNORMAL HIGH (ref 0.44–1.00)
GFR, Estimated: 53 mL/min — ABNORMAL LOW (ref >60)
Glucose, Bld: 129 mg/dL — ABNORMAL HIGH (ref 70–99)
Potassium: 4.2 mmol/L (ref 3.5–5.1)
Sodium: 140 mmol/L (ref 135–145)
Total Bilirubin: 0.8 mg/dL (ref 0.3–1.2)
Total Protein: 7.8 g/dL (ref 6.5–8.1)

## 2022-10-05 LAB — CK: Total CK: 49 U/L (ref 38–234)

## 2022-10-05 LAB — IRON AND TIBC
Iron: 175 ug/dL — ABNORMAL HIGH (ref 28–170)
Saturation Ratios: 59 % — ABNORMAL HIGH (ref 10.4–31.8)
TIBC: 295 ug/dL (ref 250–450)
UIBC: 120 ug/dL

## 2022-10-05 LAB — FERRITIN: Ferritin: 385 ng/mL — ABNORMAL HIGH (ref 11–307)

## 2022-10-05 LAB — SAMPLE TO BLOOD BANK

## 2022-10-05 MED ORDER — DARBEPOETIN ALFA 500 MCG/ML IJ SOSY
500.0000 ug | PREFILLED_SYRINGE | Freq: Once | INTRAMUSCULAR | Status: AC
  Administered 2022-10-05: 500 ug via SUBCUTANEOUS
  Filled 2022-10-05: qty 1

## 2022-10-05 NOTE — Progress Notes (Unsigned)
[facility] CONSULT NOTE  Patient Care Team: [name], [name], MD as PCP - General Rogue Bussing, [name], MD as Consulting Physician (Hematology and Oncology)  CHIEF COMPLAINTS/PURPOSE OF CONSULTATION: MDS   Oncology History Overview Note   # July 2020-myelodysplastic syndrome with ringed sideroblasts-Normal karyotype; no blasts [IPSS R-Very low risk ~median survival 8.3 years]; iron studies E93 folic acid myeloma panel normal; pyridoxine levels/copper/zinc-WNL. Erythropoietin levels-60. II OPINION at [place] [Dr.DeCastro]  # [facility]/ NGS: TET2([id number])c.2524delT(p.Ser842GlnfsTer31) Exon 3 frame-shift SF3B1([id number])c.2098A>G(p.Lys700Glu) Exon 15 missense  DNMT3A(NM_[id number])c.2204A>G(p.Tyr735Cys) Exon 19 missense   # JAN 11th 2020- Aranesp/retacrit;  # July 30th, 2021- START REVLIMID 10 mg/day  [SF3B62mtation]  MARCH 2023- BONE MARROW, ASPIRATE, CLOT, CORE:  -  Hypercellular marrow involved by myelodysplastic syndrome with ring  sideroblasts (MDS-RS)  -  Polytypic plasmacytosis   # MARCh 2023-increase the frequency of Aranesp 400 mcg every 2 weeks; continue Revlimid.   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   05/11/2022 - 05/11/2022 Chemotherapy   Patient is on Treatment Plan : MYELODYSPLASIA Luspatercept q21d     05/11/2022 -  Chemotherapy   Patient is on Treatment Plan : MYELODYSPLASIA Luspatercept q21d       HISTORY OF PRESENTING ILLNESS: Accompanied by husband ambulating independently.  LSamariya RockholdMay 72 y.o.  female history of low-grade MDS  anemia with ring sideroblasts currently on Aranesp/Luspatercept is here for follow-up.    New bilateral should and upper arm pain described as achy/throbbing constant.  Does get some pain relief with Tylenol, pain scale 8/10 today and has not taken Tylenol today.   Daily episodes where feels like heart flutters and she has to rest.  In the interim patient was evaluated for sore throat in the symptom management clinic virtual yesterday.  Patient denies any worsening cough.  Denies any worsening fevers or chills.   No worsening fatigue. No nausea no vomiting.  Denies any diarrhea. Mild joint pain- not any significantly worse.   Review of Systems  Constitutional:  Positive for malaise/fatigue. Negative for chills, diaphoresis and fever.  HENT:  Negative for nosebleeds and sore throat.   Eyes:  Negative for double vision.  Respiratory:  Negative for cough, hemoptysis, sputum production, shortness of breath and wheezing.   Cardiovascular:  Negative for chest pain and orthopnea.  Gastrointestinal:  Negative for abdominal pain, blood in stool, constipation, diarrhea, heartburn, melena and vomiting.  Genitourinary:  Negative for dysuria, frequency and urgency.  Musculoskeletal:  Positive for back pain and joint pain.  Skin: Negative.  Negative for itching and rash.  Neurological:  Negative for dizziness, tingling, focal weakness, weakness and headaches.  Endo/Heme/Allergies:  Does not bruise/bleed easily.  Psychiatric/Behavioral:  Negative for depression. The patient is not nervous/anxious and does not have insomnia.     MEDICAL HISTORY:  Past Medical History:  Diagnosis Date   Allergic rhinitis, cause unspecified    Anemia, unspecified    Carpal tunnel syndrome    Cystitis, unspecified    Family history of osteoporosis    PONV (postoperative nausea and vomiting)    Postnasal drip  Syncope and collapse     SURGICAL HISTORY: Past Surgical History:  Procedure Laterality Date   BONE  MARROW BIOPSY     CARPAL TUNNEL RELEASE     COLONOSCOPY  09/28/2003   COLONOSCOPY WITH PROPOFOL N/A 07/31/2015   Procedure: COLONOSCOPY WITH PROPOFOL;  Surgeon: Ronald Lobo, MD;  Location: [facility];  Service: Endoscopy;  Laterality: N/A;   DIAGNOSTIC LAPAROSCOPY     dx lack of pregnancy    EYE SURGERY     lasik, cataract, prk,yag procedure    SOCIAL HISTORY: Social History   Socioeconomic History   Marital status: Married    Spouse name: Not on file   Number of children: Not on file   Years of education: Not on file   Highest education level: Not on file  Occupational History   Not on file  Tobacco Use   Smoking status: Never   Smokeless tobacco: Never  Vaping Use   Vaping Use: Never used  Substance and Sexual Activity   Alcohol use: Yes    Alcohol/week: 0.0 standard drinks of alcohol    Comment: Rare   Drug use: No   Sexual activity: Not Currently  Other Topics Concern   Not on file  Social History Narrative   No regular exercise      Drinks lots of [employer]         Social Determinants of Health   Financial Resource Strain: Low Risk  (05/20/2022)   Overall Financial Resource Strain (CARDIA)    Difficulty of Paying Living Expenses: Not very hard  Food Insecurity: No Food Insecurity (05/20/2022)   Hunger Vital Sign    Worried About Running Out of Food in the Last Year: Never true    Ran Out of Food in the Last Year: Never true  Transportation Needs: No Transportation Needs (05/20/2022)   PRAPARE - Hydrologist (Medical): No    Lack of Transportation (Non-Medical): No  Physical Activity: Inactive (05/20/2022)   Exercise Vital Sign    Days of Exercise per Week: 0 days    Minutes of Exercise per Session: 0 min  Stress: No Stress Concern Present (05/20/2022)   [place]    Feeling of Stress : Only a little  Social Connections: Moderately Integrated (05/20/2022)    Social Connection and Isolation Panel [NHANES]    Frequency of Communication with Friends and Family: More than three times a week    Frequency of Social Gatherings with Friends and Family: Once a week    Attends Religious Services: More than 4 times per year    Active Member of [employer] or Organizations: No    Attends Archivist Meetings: Never    Marital Status: Married  Human resources officer Violence: Not At Risk (05/20/2022)   Humiliation, Afraid, Rape, and Kick questionnaire    Fear of Current or Ex-Partner: No    Emotionally Abused: No    Physically Abused: No    Sexually Abused: No    FAMILY HISTORY: Family History  Problem Relation Age of Onset   Osteoporosis Mother    Stroke Mother    Coronary artery disease Father    Stroke Father 76   Diabetes Other        Aunts and uncles   Breast cancer Paternal Aunt    Breast cancer Maternal Aunt    Arthritis/Rheumatoid Child    Breast cancer Cousin     ALLERGIES:  is allergic  to codeine.  MEDICATIONS:  Current Outpatient Medications  Medication Sig Dispense Refill   acetaminophen (TYLENOL) 500 MG tablet Take 500-1,000 mg by mouth every 6 (six) hours as needed for mild pain or headache.     aspirin EC 81 MG tablet Take 81 mg by mouth at bedtime. Swallow whole.      Biotin 10 MG CAPS Take by mouth.     Calcium Carb-Cholecalciferol (CALCIUM-VITAMIN D3) 600-10 MG-MCG CAPS Take by mouth in the morning and at bedtime. Taking twice dailly     Darbepoetin Alfa (ARANESP) 500 MCG/ML SOSY injection Inject 500 mcg into the skin See admin instructions. Inject 500 mcg into the skin every week     ergocalciferol (VITAMIN D2) 1.25 MG (50000 UT) capsule Take 1 capsule (50,000 Units total) by mouth once a week. 12 capsule 0   levothyroxine (SYNTHROID) 25 MCG tablet TAKE 1 TABLET BY MOUTH ONCE A DAY BEFOREBREAKFAST. 90 tablet 3   ondansetron (ZOFRAN ODT) 8 MG disintegrating tablet Take 1 tablet (8 mg total) by mouth every 8 (eight) hours as  needed for nausea or vomiting. 30 tablet 0   VITAMIN D PO Take 2,000 Units by mouth daily.     No current facility-administered medications for this visit.      PHYSICAL EXAMINATION:   Vitals:   10/05/22 1000  BP: 110/74  Pulse: 71  Resp: 16  Temp: (!) 96.5 F (35.8 C)  SpO2: 96%     Filed Weights   10/05/22 1000  Weight: 148 lb 9.6 oz (67.4 kg)      Physical Exam HENT:     Head: Normocephalic and atraumatic.     Mouth/Throat:     Pharynx: No oropharyngeal exudate.  Eyes:     Pupils: Pupils are equal, round, and reactive to light.  Cardiovascular:     Rate and Rhythm: Normal rate and regular rhythm.     Pulses: Normal pulses.     Heart sounds: Normal heart sounds.  Pulmonary:     Effort: Pulmonary effort is normal. No respiratory distress.     Breath sounds: Normal breath sounds. No wheezing.  Abdominal:     General: Bowel sounds are normal. There is no distension.     Palpations: Abdomen is soft. There is no mass.     Tenderness: There is no abdominal tenderness. There is no guarding or rebound.  Musculoskeletal:        General: No tenderness. Normal range of motion.     Cervical back: Normal range of motion and neck supple.  Skin:    General: Skin is warm.  Neurological:     Mental Status: She is alert and oriented to person, place, and time.  Psychiatric:        Mood and Affect: Affect normal.     LABORATORY DATA:  I have reviewed the data as listed Lab Results  Component Value Date   WBC 9.4 10/05/2022   HGB 9.8 (L) 10/05/2022   HCT 30.0 (L) 10/05/2022   MCV 99.0 10/05/2022   PLT 273 10/05/2022   Recent Labs    08/24/22 1015 09/14/22 1332 10/05/22 0929  NA 135 139 140  K 3.9 4.1 4.2  CL 104 104 107  CO2 '22 27 24  ' GLUCOSE 145* 114* 129*  BUN 20 24* 25*  CREATININE 0.96 1.17* 1.11*  CALCIUM 9.3 9.3 8.9  GFRNONAA >60 50* 53*  PROT 8.2* 8.7* 7.8  ALBUMIN 4.2 4.2 4.0  AST '25 20 24  ' ALT  '15 14 14  ' ALKPHOS 50 44 44  BILITOT 0.6 0.9  0.8      No results found.  No problem-specific Assessment & Plan notes found for this encounter.  All questions were answered. The patient knows to call the clinic with any problems, questions or concerns.   Cammie Sickle, MD 10/05/2022 10:09 AM

## 2022-10-05 NOTE — Assessment & Plan Note (Addendum)
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 MARCH 2023-bone marrow biopsy no evidence of obvious progression of MDS or any acute process.  Currently on Aranesp 100 mcg every 2-3 week plus Luspatercept.   # HOLD Luspatercept # 9 today [given ongoing arthralgias/myalgias see below]. Continue with Aranesp today- Hb 9.8 today- STABLE.  Consider dose reduction if Luspatercept is causing patient's myalgias.  # bilateral shoulder pain [hx of right rotator cuff tear]; however worsening myalgias-question related to Luspatercept.  Since patient's hemoglobin is fairly stable at 9.8.  I think is reasonable to hold off Luspatercept today.  Will reassess at next visit.  Check CK levels.  Check vitamin D levels.  Calcium 8.9.  # Vaccination: s/p flu shot. Consider/recommend COVID/ RSV  # DISPOSITION: # ADD CK to labs from today-  # HOLD  Luspatecept  Today; proceed with Aranesp today  # follow up in 3 weeks--MD;  labs CBC/cmp; LDH; HOLD tube;  possible aranesp; Luspatercept  SQ;add- vit D  Dr.B

## 2022-10-05 NOTE — Progress Notes (Signed)
New bilateral should and upper arm pain described as achy/throbbing constant.  Does get some pain relief with Tylenol, pain scale 8/10 today and has not taken Tylenol today.  Daily episodes where feels like heart flutters and she has to rest.

## 2022-10-07 ENCOUNTER — Encounter: Payer: Self-pay | Admitting: Internal Medicine

## 2022-10-26 ENCOUNTER — Inpatient Hospital Stay: Payer: PPO

## 2022-10-26 ENCOUNTER — Inpatient Hospital Stay (HOSPITAL_BASED_OUTPATIENT_CLINIC_OR_DEPARTMENT_OTHER): Payer: PPO | Admitting: Internal Medicine

## 2022-10-26 ENCOUNTER — Encounter: Payer: Self-pay | Admitting: Internal Medicine

## 2022-10-26 VITALS — BP 106/71 | HR 78 | Temp 97.0°F | Resp 16 | Wt 147.8 lb

## 2022-10-26 DIAGNOSIS — D46Z Other myelodysplastic syndromes: Secondary | ICD-10-CM

## 2022-10-26 DIAGNOSIS — M25511 Pain in right shoulder: Secondary | ICD-10-CM | POA: Diagnosis not present

## 2022-10-26 DIAGNOSIS — M25512 Pain in left shoulder: Secondary | ICD-10-CM | POA: Diagnosis not present

## 2022-10-26 DIAGNOSIS — D461 Refractory anemia with ring sideroblasts: Secondary | ICD-10-CM

## 2022-10-26 DIAGNOSIS — Z803 Family history of malignant neoplasm of breast: Secondary | ICD-10-CM | POA: Diagnosis not present

## 2022-10-26 DIAGNOSIS — M7918 Myalgia, other site: Secondary | ICD-10-CM

## 2022-10-26 LAB — COMPREHENSIVE METABOLIC PANEL
ALT: 13 U/L (ref 0–44)
AST: 24 U/L (ref 15–41)
Albumin: 4.1 g/dL (ref 3.5–5.0)
Alkaline Phosphatase: 43 U/L (ref 38–126)
Anion gap: 11 (ref 5–15)
BUN: 23 mg/dL (ref 8–23)
CO2: 22 mmol/L (ref 22–32)
Calcium: 9 mg/dL (ref 8.9–10.3)
Chloride: 103 mmol/L (ref 98–111)
Creatinine, Ser: 1.11 mg/dL — ABNORMAL HIGH (ref 0.44–1.00)
GFR, Estimated: 53 mL/min — ABNORMAL LOW (ref >60)
Glucose, Bld: 165 mg/dL — ABNORMAL HIGH (ref 70–99)
Potassium: 3.7 mmol/L (ref 3.5–5.1)
Sodium: 136 mmol/L (ref 135–145)
Total Bilirubin: 0.8 mg/dL (ref 0.3–1.2)
Total Protein: 8.4 g/dL — ABNORMAL HIGH (ref 6.5–8.1)

## 2022-10-26 LAB — CBC WITH DIFFERENTIAL/PLATELET
Abs Immature Granulocytes: 0.32 10*3/uL — ABNORMAL HIGH (ref 0.00–0.07)
Basophils Absolute: 0.1 10*3/uL (ref 0.0–0.1)
Basophils Relative: 1 %
Eosinophils Absolute: 0.1 10*3/uL (ref 0.0–0.5)
Eosinophils Relative: 1 %
HCT: 28.9 % — ABNORMAL LOW (ref 36.0–46.0)
Hemoglobin: 9.1 g/dL — ABNORMAL LOW (ref 12.0–15.0)
Immature Granulocytes: 4 %
Lymphocytes Relative: 18 %
Lymphs Abs: 1.7 10*3/uL (ref 0.7–4.0)
MCH: 31.4 pg (ref 26.0–34.0)
MCHC: 31.5 g/dL (ref 30.0–36.0)
MCV: 99.7 fL (ref 80.0–100.0)
Monocytes Absolute: 1.6 10*3/uL — ABNORMAL HIGH (ref 0.1–1.0)
Monocytes Relative: 18 %
Neutro Abs: 5.4 10*3/uL (ref 1.7–7.7)
Neutrophils Relative %: 58 %
Platelets: 264 10*3/uL (ref 150–400)
RBC: 2.9 MIL/uL — ABNORMAL LOW (ref 3.87–5.11)
RDW: 30.5 % — ABNORMAL HIGH (ref 11.5–15.5)
WBC: 9.2 10*3/uL (ref 4.0–10.5)
nRBC: 1.1 % — ABNORMAL HIGH (ref 0.0–0.2)

## 2022-10-26 LAB — LACTATE DEHYDROGENASE: LDH: 128 U/L (ref 98–192)

## 2022-10-26 MED ORDER — DARBEPOETIN ALFA 500 MCG/ML IJ SOSY
500.0000 ug | PREFILLED_SYRINGE | Freq: Once | INTRAMUSCULAR | Status: AC
  Administered 2022-10-26: 500 ug via SUBCUTANEOUS
  Filled 2022-10-26: qty 1

## 2022-10-26 MED ORDER — LUSPATERCEPT-AAMT 75 MG ~~LOC~~ SOLR
1.0000 mg/kg | Freq: Once | SUBCUTANEOUS | Status: AC
  Administered 2022-10-26: 65 mg via SUBCUTANEOUS
  Filled 2022-10-26: qty 1.3

## 2022-10-26 NOTE — Assessment & Plan Note (Addendum)
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 MARCH 2023-bone marrow biopsy no evidence of obvious progression of MDS or any acute process.  Currently on Aranesp 100 mcg every 2-3 week plus Luspatercept.   # Luspatercept # 9 today [given ongoing arthralgias/myalgias see below]. Continue with Aranesp today- Hb 9.1 today- stable.  Consider dose reduction if Luspatercept is causing patient's myalgias.  # bilateral shoulder pain [hx of right rotator cuff tear]; however worsening myalgias-?? to Luspatercept. CK levels- WNL. Await  vitamin D levels.  Calcium 8.9. check CRP  # Vaccination: s/p flu shot. Consider/recommend COVID/ RSV  # DISPOSITION  # proceed with Luspatecept  Today; proceed with Aranesp today  # follow up in 3 weeks--MD;  labs CBC/cmp; LDH; CRP- possible aranesp; Luspatercept  SQ;   Dr.B

## 2022-10-26 NOTE — Progress Notes (Signed)
Energy is pretty good. Appetite is fairly good. Denies dizziness. Does have dyspnea with exertion/ steps. No visible blood in stools.

## 2022-10-26 NOTE — Progress Notes (Signed)
[place] CONSULT NOTE  Patient Care Team: [name], [name], MD as PCP - General Rogue Bussing, [name], MD as Consulting Physician (Hematology and Oncology)  CHIEF COMPLAINTS/PURPOSE OF CONSULTATION: MDS   Oncology History Overview Note   # July 2020-myelodysplastic syndrome with ringed sideroblasts-Normal karyotype; no blasts [IPSS R-Very low risk ~median survival 8.3 years]; iron studies O27 folic acid myeloma panel normal; pyridoxine levels/copper/zinc-WNL. Erythropoietin levels-60. 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; September 2021 ejection fraction 60 to 65%;   # colonoscopy- 2016 [Dr.Bucinni];  DIAGNOSIS: MDS/low-grade with ringed sideroblasts  STAGE: Low       ;  GOALS: Control  CURRENT/MOST RECENT THERAPY : EPO agent+ REV    [place]  04/23/2019 Initial Diagnosis   [place]   05/11/2022 - 05/11/2022 Chemotherapy   Patient is on Treatment Plan : MYELODYSPLASIA Luspatercept q21d     05/11/2022 -  Chemotherapy   Patient is on Treatment Plan : MYELODYSPLASIA Luspatercept q21d      HISTORY OF PRESENTING ILLNESS: Accompanied by husband ambulating independently.  Carla GallicchioMay 72 y.o.  female history of low-grade MDS anemia  with ring sideroblasts currently on Aranesp/Luspatercept is here for follow-up.    Luspatercept was held 3 weeks ago because of ongoing arthritis.  Energy is pretty good. Appetite is fairly good. Denies dizziness. Does have dyspnea with exertion/ steps. No visible blood in stools..   Notes to have improvement of bilateral should and upper arm pain.  Does get some pain relief with Tylenol, as needed.    Patient denies any worsening cough.  Denies any worsening fevers or chills. Chronic mild fatigue.  No nausea no vomiting.  Review of Systems  Constitutional:  Positive for malaise/fatigue. Negative for chills, diaphoresis and fever.  HENT:  Negative for nosebleeds and sore throat.   Eyes:  Negative for double vision.  Respiratory:  Negative for cough, hemoptysis, sputum production, shortness of breath and wheezing.   Cardiovascular:  Negative for chest pain and orthopnea.  Gastrointestinal:  Negative for abdominal pain, blood in stool, constipation, diarrhea, heartburn, melena and vomiting.  Genitourinary:  Negative for dysuria, frequency and urgency.  Musculoskeletal:  Positive for back pain and joint pain.  Skin: Negative.  Negative for itching and rash.  Neurological:  Negative for dizziness, tingling, focal weakness, weakness and headaches.  Endo/Heme/Allergies:  Does not bruise/bleed easily.  Psychiatric/Behavioral:  Negative for depression. The patient is not nervous/anxious and does not have insomnia.     MEDICAL HISTORY:  Past Medical History:  Diagnosis Date   Allergic rhinitis, cause unspecified    Anemia, unspecified    Carpal tunnel syndrome    Cystitis, unspecified    Family history of osteoporosis    PONV (postoperative nausea and vomiting)    Postnasal drip    Syncope and collapse     SURGICAL HISTORY: Past Surgical History:  Procedure Laterality Date  BONE MARROW BIOPSY     CARPAL TUNNEL RELEASE     COLONOSCOPY  09/28/2003   COLONOSCOPY WITH PROPOFOL N/A  07/31/2015   Procedure: COLONOSCOPY WITH PROPOFOL;  Surgeon: Ronald Lobo, MD;  Location: [facility];  Service: Endoscopy;  Laterality: N/A;   DIAGNOSTIC LAPAROSCOPY     dx lack of pregnancy    EYE SURGERY     lasik, cataract, prk,yag procedure    SOCIAL HISTORY: Social History   Socioeconomic History   Marital status: Married    Spouse name: Not on file   Number of children: Not on file   Years of education: Not on file   Highest education level: Not on file  Occupational History   Not on file  Tobacco Use   Smoking status: Never   Smokeless tobacco: Never  Vaping Use   Vaping Use: Never used  Substance and Sexual Activity   Alcohol use: Yes    Alcohol/week: 0.0 standard drinks of alcohol    Comment: Rare   Drug use: No   Sexual activity: Not Currently  Other Topics Concern   Not on file  Social History Narrative   No regular exercise      Drinks lots of [employer]         Social Determinants of Health   Financial Resource Strain: Low Risk  (05/20/2022)   Overall Financial Resource Strain (CARDIA)    Difficulty of Paying Living Expenses: Not very hard  Food Insecurity: No Food Insecurity (05/20/2022)   Hunger Vital Sign    Worried About Running Out of Food in the Last Year: Never true    Ran Out of Food in the Last Year: Never true  Transportation Needs: No Transportation Needs (05/20/2022)   PRAPARE - Hydrologist (Medical): No    Lack of Transportation (Non-Medical): No  Physical Activity: Inactive (05/20/2022)   Exercise Vital Sign    Days of Exercise per Week: 0 days    Minutes of Exercise per Session: 0 min  Stress: No Stress Concern Present (05/20/2022)   [place]    Feeling of Stress : Only a little  Social Connections: Moderately Integrated (05/20/2022)   Social Connection and Isolation Panel [NHANES]    Frequency of Communication with Friends and Family: More  than three times a week    Frequency of Social Gatherings with Friends and Family: Once a week    Attends Religious Services: More than 4 times per year    Active Member of [employer] or Organizations: No    Attends Archivist Meetings: Never    Marital Status: Married  Human resources officer Violence: Not At Risk (05/20/2022)   Humiliation, Afraid, Rape, and Kick questionnaire    Fear of Current or Ex-Partner: No    Emotionally Abused: No    Physically Abused: No    Sexually Abused: No    FAMILY HISTORY: Family History  Problem Relation Age of Onset   Osteoporosis Mother    Stroke Mother    Coronary artery disease Father    Stroke Father 30   Diabetes Other        Aunts and uncles   Breast cancer Paternal Aunt    Breast cancer Maternal Aunt    Arthritis/Rheumatoid Child    Breast cancer Cousin     ALLERGIES:  is allergic to codeine.  MEDICATIONS:  Current Outpatient Medications  Medication Sig Dispense Refill   aspirin EC 81  MG tablet Take 81 mg by mouth at bedtime. Swallow whole.      Biotin 10 MG CAPS Take by mouth.     Darbepoetin Alfa (ARANESP) 500 MCG/ML SOSY injection Inject 500 mcg into the skin See admin instructions. Inject 500 mcg into the skin every week     levothyroxine (SYNTHROID) 25 MCG tablet TAKE 1 TABLET BY MOUTH ONCE A DAY BEFOREBREAKFAST. 90 tablet 3   VITAMIN D PO Take 2,000 Units by mouth daily.     acetaminophen (TYLENOL) 500 MG tablet Take 500-1,000 mg by mouth every 6 (six) hours as needed for mild pain or headache.     ondansetron (ZOFRAN ODT) 8 MG disintegrating tablet Take 1 tablet (8 mg total) by mouth every 8 (eight) hours as needed for nausea or vomiting. (Patient not taking: Reported on 10/26/2022) 30 tablet 0   No current facility-administered medications for this visit.   Facility-Administered Medications Ordered in Other Visits  Medication Dose Route Frequency Provider Last Rate Last Admin   luspatercept-aamt (REBLOZYL) subcutaneous  injection 65 mg  1 mg/kg (Treatment Plan Recorded) Subcutaneous Once Charlaine Dalton R, MD          PHYSICAL EXAMINATION:   Vitals:   10/26/22 1019  BP: 106/71  Pulse: 78  Resp: 16  Temp: (!) 97 F (36.1 C)  SpO2: 97%     Filed Weights   10/26/22 1019  Weight: 147 lb 12.8 oz (67 kg)      Physical Exam HENT:     Head: Normocephalic and atraumatic.     Mouth/Throat:     Pharynx: No oropharyngeal exudate.  Eyes:     Pupils: Pupils are equal, round, and reactive to light.  Cardiovascular:     Rate and Rhythm: Normal rate and regular rhythm.     Pulses: Normal pulses.     Heart sounds: Normal heart sounds.  Pulmonary:     Effort: Pulmonary effort is normal. No respiratory distress.     Breath sounds: Normal breath sounds. No wheezing.  Abdominal:     General: Bowel sounds are normal. There is no distension.     Palpations: Abdomen is soft. There is no mass.     Tenderness: There is no abdominal tenderness. There is no guarding or rebound.  Musculoskeletal:        General: No tenderness. Normal range of motion.     Cervical back: Normal range of motion and neck supple.  Skin:    General: Skin is warm.  Neurological:     Mental Status: She is alert and oriented to person, place, and time.  Psychiatric:        Mood and Affect: Affect normal.     LABORATORY DATA:  I have reviewed the data as listed Lab Results  Component Value Date   WBC 9.2 10/26/2022   HGB 9.1 (L) 10/26/2022   HCT 28.9 (L) 10/26/2022   MCV 99.7 10/26/2022   PLT 264 10/26/2022   Recent Labs    09/14/22 1332 10/05/22 0929 10/26/22 0942  NA 139 140 136  K 4.1 4.2 3.7  CL 104 107 103  CO2 '27 24 22  ' GLUCOSE 114* 129* 165*  BUN 24* 25* 23  CREATININE 1.17* 1.11* 1.11*  CALCIUM 9.3 8.9 9.0  GFRNONAA 50* 53* 53*  PROT 8.7* 7.8 8.4*  ALBUMIN 4.2 4.0 4.1  AST '20 24 24  ' ALT '14 14 13  ' ALKPHOS 44 44 43  BILITOT 0.9 0.8 0.8  No results found.  MDS (myelodysplastic  syndrome), low grade (HCC) # Low grade MDS- with ringed sideroblasts; no blasts. POSITIVE  For SF3B1; R-IPSS-low risk. MARCH 2023-bone marrow biopsy no evidence of obvious progression of MDS or any acute process.  Currently on Aranesp 100 mcg every 2-3 week plus Luspatercept.   # Luspatercept # 9 today [given ongoing arthralgias/myalgias see below]. Continue with Aranesp today- Hb 9.1 today- stable.  Consider dose reduction if Luspatercept is causing patient's myalgias.  # bilateral shoulder pain [hx of right rotator cuff tear]; however worsening myalgias-?? to Luspatercept. CK levels- WNL. Await  vitamin D levels.  Calcium 8.9. check CRP  # Vaccination: s/p flu shot. Consider/recommend COVID/ RSV  # DISPOSITION  # proceed with Luspatecept  Today; proceed with Aranesp today  # follow up in 3 weeks--MD;  labs CBC/cmp; LDH; CRP- possible aranesp; Luspatercept  SQ;   Dr.B  All questions were answered. The patient knows to call the clinic with any problems, questions or concerns.   Cammie Sickle, MD 10/26/2022 11:31 AM

## 2022-10-27 LAB — MISC LABCORP TEST (SEND OUT): Labcorp test code: 81950

## 2022-10-28 LAB — SAMPLE TO BLOOD BANK

## 2022-10-28 DEATH — deceased

## 2022-11-04 ENCOUNTER — Telehealth: Payer: Self-pay | Admitting: *Deleted

## 2022-11-04 MED ORDER — ERGOCALCIFEROL 1.25 MG (50000 UT) PO CAPS: 50000.0000 [IU] | ORAL_CAPSULE | ORAL | 1 refills | Status: DC

## 2022-11-04 NOTE — Telephone Encounter (Signed)
Called pt to instruct her to stop taking OTC Vitamin D tabs. Dr B sent prescription into pharmacy for 50,000 unit tablets  once weekly. Her Vit D level came back low. Pt agreeable to plan.

## 2022-11-04 NOTE — Progress Notes (Signed)
Patient vitamin D levels low recommend prescription strength vitamin D.  Prescription sent.  Please inform patient of above; she can hold off her current vitamin D tablets.

## 2022-11-04 NOTE — Addendum Note (Signed)
Addended by: Cammie Sickle on: 11/04/2022 03:11 PM   Modules accepted: Orders

## 2022-11-16 ENCOUNTER — Inpatient Hospital Stay: Payer: PPO | Attending: Internal Medicine

## 2022-11-16 ENCOUNTER — Encounter: Payer: Self-pay | Admitting: Internal Medicine

## 2022-11-16 ENCOUNTER — Inpatient Hospital Stay (HOSPITAL_BASED_OUTPATIENT_CLINIC_OR_DEPARTMENT_OTHER): Payer: PPO | Admitting: Internal Medicine

## 2022-11-16 ENCOUNTER — Inpatient Hospital Stay: Payer: PPO

## 2022-11-16 VITALS — BP 110/71 | HR 64 | Temp 96.2°F | Resp 16 | Wt 148.8 lb

## 2022-11-16 DIAGNOSIS — D46Z Other myelodysplastic syndromes: Secondary | ICD-10-CM

## 2022-11-16 DIAGNOSIS — M25511 Pain in right shoulder: Secondary | ICD-10-CM | POA: Insufficient documentation

## 2022-11-16 DIAGNOSIS — M25512 Pain in left shoulder: Secondary | ICD-10-CM | POA: Insufficient documentation

## 2022-11-16 DIAGNOSIS — N183 Chronic kidney disease, stage 3 unspecified: Secondary | ICD-10-CM | POA: Diagnosis not present

## 2022-11-16 DIAGNOSIS — M25552 Pain in left hip: Secondary | ICD-10-CM | POA: Diagnosis not present

## 2022-11-16 DIAGNOSIS — E039 Hypothyroidism, unspecified: Secondary | ICD-10-CM | POA: Insufficient documentation

## 2022-11-16 DIAGNOSIS — D461 Refractory anemia with ring sideroblasts: Secondary | ICD-10-CM | POA: Diagnosis not present

## 2022-11-16 DIAGNOSIS — R0609 Other forms of dyspnea: Secondary | ICD-10-CM | POA: Insufficient documentation

## 2022-11-16 DIAGNOSIS — M7632 Iliotibial band syndrome, left leg: Secondary | ICD-10-CM | POA: Diagnosis not present

## 2022-11-16 DIAGNOSIS — Z803 Family history of malignant neoplasm of breast: Secondary | ICD-10-CM | POA: Diagnosis not present

## 2022-11-16 DIAGNOSIS — M25562 Pain in left knee: Secondary | ICD-10-CM | POA: Diagnosis not present

## 2022-11-16 DIAGNOSIS — M7062 Trochanteric bursitis, left hip: Secondary | ICD-10-CM | POA: Diagnosis not present

## 2022-11-16 LAB — COMPREHENSIVE METABOLIC PANEL
ALT: 17 U/L (ref 0–44)
AST: 26 U/L (ref 15–41)
Albumin: 4.2 g/dL (ref 3.5–5.0)
Alkaline Phosphatase: 44 U/L (ref 38–126)
Anion gap: 9 (ref 5–15)
BUN: 21 mg/dL (ref 8–23)
CO2: 23 mmol/L (ref 22–32)
Calcium: 9.2 mg/dL (ref 8.9–10.3)
Chloride: 105 mmol/L (ref 98–111)
Creatinine, Ser: 1.07 mg/dL — ABNORMAL HIGH (ref 0.44–1.00)
GFR, Estimated: 55 mL/min — ABNORMAL LOW (ref >60)
Glucose, Bld: 130 mg/dL — ABNORMAL HIGH (ref 70–99)
Potassium: 4.1 mmol/L (ref 3.5–5.1)
Sodium: 137 mmol/L (ref 135–145)
Total Bilirubin: 0.9 mg/dL (ref 0.3–1.2)
Total Protein: 8.7 g/dL — ABNORMAL HIGH (ref 6.5–8.1)

## 2022-11-16 LAB — CBC WITH DIFFERENTIAL/PLATELET
Abs Immature Granulocytes: 0.33 10*3/uL — ABNORMAL HIGH (ref 0.00–0.07)
Basophils Absolute: 0.1 10*3/uL (ref 0.0–0.1)
Basophils Relative: 1 %
Eosinophils Absolute: 0.1 10*3/uL (ref 0.0–0.5)
Eosinophils Relative: 1 %
HCT: 33.1 % — ABNORMAL LOW (ref 36.0–46.0)
Hemoglobin: 10.2 g/dL — ABNORMAL LOW (ref 12.0–15.0)
Immature Granulocytes: 5 %
Lymphocytes Relative: 28 %
Lymphs Abs: 2 10*3/uL (ref 0.7–4.0)
MCH: 30.9 pg (ref 26.0–34.0)
MCHC: 30.8 g/dL (ref 30.0–36.0)
MCV: 100.3 fL — ABNORMAL HIGH (ref 80.0–100.0)
Monocytes Absolute: 1.1 10*3/uL — ABNORMAL HIGH (ref 0.1–1.0)
Monocytes Relative: 15 %
Neutro Abs: 3.8 10*3/uL (ref 1.7–7.7)
Neutrophils Relative %: 50 %
Platelets: 260 10*3/uL (ref 150–400)
RBC: 3.3 MIL/uL — ABNORMAL LOW (ref 3.87–5.11)
RDW: 30.9 % — ABNORMAL HIGH (ref 11.5–15.5)
WBC: 7.4 10*3/uL (ref 4.0–10.5)
nRBC: 0.8 % — ABNORMAL HIGH (ref 0.0–0.2)

## 2022-11-16 LAB — LACTATE DEHYDROGENASE: LDH: 125 U/L (ref 98–192)

## 2022-11-16 LAB — C-REACTIVE PROTEIN: CRP: <0.5 mg/dL (ref <1.0)

## 2022-11-16 MED ORDER — LUSPATERCEPT-AAMT 75 MG ~~LOC~~ SOLR
1.0000 mg/kg | Freq: Once | SUBCUTANEOUS | Status: AC
  Administered 2022-11-16: 65 mg via SUBCUTANEOUS
  Filled 2022-11-16: qty 1.3

## 2022-11-16 NOTE — Progress Notes (Signed)
[facility] CONSULT NOTE  Patient Care Team: [name], Carla Fanny, MD as PCP - General Rogue [name], Carla Headland, MD as Consulting Physician (Hematology and Oncology)  CHIEF COMPLAINTS/PURPOSE OF CONSULTATION: MDS   Oncology History Overview Note   # July 2020-myelodysplastic syndrome with ringed sideroblasts-Normal karyotype; no blasts [IPSS R-Very low risk ~median survival 8.3 years]; iron studies I34 folic acid myeloma panel normal; pyridoxine levels/copper/zinc-WNL. Erythropoietin levels-60. II OPINION at [place] [Dr.DeCastro]  # [facility]/ NGS: TET2([id number])c.2524delT(p.Ser842GlnfsTer31) Exon 3 frame-shift SF3B1([id number])c.2098A>G(p.Lys700Glu) Exon 15 missense  DNMT3A([id number])c.2204A>G(p.Tyr735Cys) Exon 19 missense   # JAN 11th 2020- Aranesp/retacrit;  # July 30th, 2021- START REVLIMID 10 mg/day  [SF3B76mtation]  MARCH 2023- BONE MARROW, ASPIRATE, CLOT, CORE:  -  Hypercellular marrow involved by myelodysplastic syndrome with ring  sideroblasts (MDS-RS)  -  Polytypic plasmacytosis   # MARCh 2023-increase the frequency of Aranesp 400 mcg every 2 weeks; continue Revlimid.   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; September 2021 ejection fraction 60 to 65%;   # colonoscopy- 2016 [Dr.Bucinni];  DIAGNOSIS: MDS/low-grade with ringed sideroblasts  STAGE: Low       ;  GOALS: Control  CURRENT/MOST RECENT THERAPY : EPO agent+ REV    MDS (myelodysplastic syndrome), low grade (Carla [name]  04/23/2019 Initial Diagnosis   MDS (myelodysplastic syndrome), low grade (Carla [name]   05/11/2022 - 05/11/2022 Chemotherapy   Patient is on Treatment Plan : MYELODYSPLASIA Luspatercept q21d     05/11/2022 -  Chemotherapy   Patient is on Treatment Plan : MYELODYSPLASIA Luspatercept q21d      HISTORY OF PRESENTING ILLNESS: Accompanied by husband ambulating independently.  LNevin KozuchMay 72 y.o.  female history of low-grade MDS anemia  with ring sideroblasts currently on Aranesp/Luspatercept is here for follow-up.    New Left hip pain that radiates down to foot after exercising on bike, 6/10 pain today.  [facility]     Still taking Calcium+D with the prescription Vitamin D.   Energy is pretty good. Appetite is fairly good. Denies dizziness. Does have dyspnea with exertion/ steps. No visible blood in stools.   Review of Systems  Constitutional:  Positive for malaise/fatigue. Negative for chills, diaphoresis and fever.  HENT:  Negative for nosebleeds and sore throat.   Eyes:  Negative for double vision.  Respiratory:  Negative for cough, hemoptysis, sputum production, shortness of breath and wheezing.   Cardiovascular:  Negative for chest pain and orthopnea.  Gastrointestinal:  Negative for abdominal pain, blood in stool, constipation, diarrhea, heartburn, melena and vomiting.  Genitourinary:  Negative for dysuria, frequency and urgency.  Musculoskeletal:  Positive for back pain and joint pain.  Skin: Negative.  Negative for itching and rash.  Neurological:  Negative for dizziness, tingling, focal weakness, weakness and headaches.  Endo/Heme/Allergies:  Does not bruise/bleed easily.  Psychiatric/Behavioral:  Negative for depression. The patient is not nervous/anxious and does not have insomnia.     MEDICAL HISTORY:  Past Medical History:  Diagnosis Date   Allergic rhinitis, cause unspecified    Anemia, unspecified    Carpal tunnel syndrome    Cystitis, unspecified    Family history of osteoporosis    PONV (postoperative nausea and vomiting)    Postnasal drip    Syncope and collapse     SURGICAL HISTORY: Past Surgical History:  Procedure Laterality Date   BONE MARROW BIOPSY     CARPAL TUNNEL RELEASE     COLONOSCOPY  09/28/2003   COLONOSCOPY WITH PROPOFOL N/A 07/31/2015   Procedure: COLONOSCOPY WITH PROPOFOL;  Surgeon: Carla Lobo, MD;  Location: [facility];   Service: Endoscopy;  Laterality: N/A;   DIAGNOSTIC LAPAROSCOPY     dx lack of pregnancy    EYE SURGERY     lasik, cataract, prk,yag procedure    SOCIAL HISTORY: Social History   Socioeconomic History   Marital status: Married    Spouse name: Not on file   Number of children: Not on file   Years of education: Not on file   Highest education level: Not on file  Occupational History   Not on file  Tobacco Use   Smoking status: Never   Smokeless tobacco: Never  Vaping Use   Vaping Use: Never used  Substance and Sexual Activity   Alcohol use: Yes    Alcohol/week: 0.0 standard drinks of alcohol    Comment: Rare   Drug use: No   Sexual activity: Not Currently  Other Topics Concern   Not on file  Social History Narrative   No regular exercise      Drinks lots of [employer]         Social Determinants of Health   Financial Resource Strain: Low Risk  (05/20/2022)   Overall Financial Resource Strain (CARDIA)    Difficulty of Paying Living Expenses: Not very hard  Food Insecurity: No Food Insecurity (05/20/2022)   Hunger Vital Sign    Worried About Running Out of Food in the Last Year: Never true    Ran Out of Food in the Last Year: Never true  Transportation Needs: No Transportation Needs (05/20/2022)   PRAPARE - Hydrologist (Medical): No    Lack of Transportation (Non-Medical): No  Physical Activity: Inactive (05/20/2022)   Exercise Vital Sign    Days of Exercise per Week: 0 days    Minutes of Exercise per Session: 0 min  Stress: No Stress Concern Present (05/20/2022)   [place]    Feeling of Stress : Only a little  Social Connections: Moderately Integrated (05/20/2022)   Social Connection and Isolation Panel [NHANES]    Frequency of Communication with Friends and Family: More than three times a week    Frequency of Social Gatherings with Friends and Family: Once a week    Attends  Religious Services: More than 4 times per year    Active Member of [employer] or Organizations: No    Attends Archivist Meetings: Never    Marital Status: Married  Human resources officer Violence: Not At Risk (05/20/2022)   Humiliation, Afraid, Rape, and Kick questionnaire    Fear of Current or Ex-Partner: No    Emotionally Abused: No    Physically Abused: No    Sexually Abused: No    FAMILY HISTORY: Family History  Problem Relation Age of Onset   Osteoporosis Mother    Stroke Mother    Coronary artery disease Father    Stroke Father 45   Diabetes Other        Aunts and uncles   Breast cancer Paternal Aunt    Breast cancer Maternal Aunt    Arthritis/Rheumatoid Child    Breast cancer Cousin     ALLERGIES:  is allergic to codeine.  MEDICATIONS:  Current Outpatient Medications  Medication Sig Dispense Refill   acetaminophen (TYLENOL) 500 MG tablet Take 500-1,000 mg by mouth every 6 (six) hours as needed for mild pain  or headache.     aspirin EC 81 MG tablet Take 81 mg by mouth at bedtime. Swallow whole.      Biotin 10 MG CAPS Take by mouth.     calcium-vitamin D (OSCAL WITH D) 500-5 MG-MCG tablet Take 1 tablet by mouth daily with breakfast.     Darbepoetin Alfa (ARANESP) 500 MCG/ML SOSY injection Inject 500 mcg into the skin See admin instructions. Inject 500 mcg into the skin every week     ergocalciferol (VITAMIN D2) 1.25 MG (50000 UT) capsule Take 1 capsule (50,000 Units total) by mouth once a week. 12 capsule 1   levothyroxine (SYNTHROID) 25 MCG tablet TAKE 1 TABLET BY MOUTH ONCE A DAY BEFOREBREAKFAST. 90 tablet 3   ondansetron (ZOFRAN ODT) 8 MG disintegrating tablet Take 1 tablet (8 mg total) by mouth every 8 (eight) hours as needed for nausea or vomiting. (Patient not taking: Reported on 10/26/2022) 30 tablet 0   No current facility-administered medications for this visit.   Facility-Administered Medications Ordered in Other Visits  Medication Dose Route Frequency  Provider Last Rate Last Admin   luspatercept-aamt (REBLOZYL) subcutaneous injection 65 mg  1 mg/kg (Treatment Plan Recorded) Subcutaneous Once Charlaine Dalton R, MD          PHYSICAL EXAMINATION:   Vitals:   11/16/22 0900  BP: 110/71  Pulse: 64  Resp: 16  Temp: (!) 96.2 F (35.7 C)     Filed Weights   11/16/22 0900  Weight: 148 lb 12.8 oz (67.5 kg)      Physical Exam HENT:     Head: Normocephalic and atraumatic.     Mouth/Throat:     Pharynx: No oropharyngeal exudate.  Eyes:     Pupils: Pupils are equal, round, and reactive to light.  Cardiovascular:     Rate and Rhythm: Normal rate and regular rhythm.     Pulses: Normal pulses.     Heart sounds: Normal heart sounds.  Pulmonary:     Effort: Pulmonary effort is normal. No respiratory distress.     Breath sounds: Normal breath sounds. No wheezing.  Abdominal:     General: Bowel sounds are normal. There is no distension.     Palpations: Abdomen is soft. There is no mass.     Tenderness: There is no abdominal tenderness. There is no guarding or rebound.  Musculoskeletal:        General: No tenderness. Normal range of motion.     Cervical back: Normal range of motion and neck supple.  Skin:    General: Skin is warm.  Neurological:     Mental Status: She is alert and oriented to person, place, and time.  Psychiatric:        Mood and Affect: Affect normal.     LABORATORY DATA:  I have reviewed the data as listed Lab Results  Component Value Date   WBC 7.4 11/16/2022   HGB 10.2 (L) 11/16/2022   HCT 33.1 (L) 11/16/2022   MCV 100.3 (H) 11/16/2022   PLT 260 11/16/2022   Recent Labs    10/05/22 0929 10/26/22 0942 11/16/22 0847  NA 140 136 137  K 4.2 3.7 4.1  CL 107 103 105  CO2 '24 22 23  ' GLUCOSE 129* 165* 130*  BUN 25* 23 21  CREATININE 1.11* 1.11* 1.07*  CALCIUM 8.9 9.0 9.2  GFRNONAA 53* 53* 55*  PROT 7.8 8.4* 8.7*  ALBUMIN 4.0 4.1 4.2  AST '24 24 26  ' ALT 14 13 17  ALKPHOS 44 43 44  BILITOT  0.8 0.8 0.9     No results found.  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 POSITIVE  For SF3B1; R-IPSS-low risk. MARCH 2023-bone marrow biopsy no evidence of obvious progression of MDS or any acute process.  Currently on Aranesp 100 mcg every 2-3 week plus Luspatercept.   # Luspatercept # 10 today [given ongoing arthralgias/myalgias see below]. Continue with Aranesp today- Hb 10.3. today- STABLE.    # bilateral shoulder pain [hx of right rotator cuff tear]; however worsening myalgias- UNLIKELY sec to Luspatercept. CK levels- WNL. FEB 2024-Vitamin D- 25/low-  on Vit D 50, Calcium 8.9. Left hip pain- sec to [facility]- awaiting [facility] [place] today.    # CKD- stage III- 58s- monitor for now  # Vaccination: s/p flu shot. Consider/recommend COVID/ RSV  # DISPOSITION:  # proceed with Luspatecept  Today; HOLD  Aranesp today  # follow up in 3 weeks--MD;  labs CBC/cmp; LDH;  possible aranesp; Luspatercept  SQ;   Dr.B  All questions were answered. The patient knows to call the clinic with any problems, questions or concerns.   Cammie Sickle, MD 11/16/2022 9:38 AM

## 2022-11-16 NOTE — Assessment & Plan Note (Addendum)
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 MARCH 2023-bone marrow biopsy no evidence of obvious progression of MDS or any acute process.  Currently on Aranesp 100 mcg every 2-3 week plus Luspatercept.   # Luspatercept # 10 today [given ongoing arthralgias/myalgias see below]. Continue with Aranesp today- Hb 10.3. today- STABLE.    # bilateral shoulder pain [hx of right rotator cuff tear]; however worsening myalgias- UNLIKELY sec to Luspatercept. CK levels- WNL. FEB 2024-Vitamin D- 25/low-  on Vit D 50, Calcium 8.9. Left hip pain- sec to [facility]- awaiting [facility] [place] today.    # CKD- stage III- 14s- monitor for now  # Vaccination: s/p flu shot. Consider/recommend COVID/ RSV  # DISPOSITION:  # proceed with Luspatecept  Today; HOLD  Aranesp today  # follow up in 3 weeks--MD;  labs CBC/cmp; LDH;  possible aranesp; Luspatercept  SQ;   Dr.B

## 2022-11-16 NOTE — Progress Notes (Signed)
New Left hip pain that radiates down to foot after exercising on bike, 6/10 pain today.  Has an appt with [facility] for evaluation today.    Still taking Calcium+D with the prescription Vitamin D

## 2022-12-03 ENCOUNTER — Telehealth: Payer: Self-pay | Admitting: *Deleted

## 2022-12-03 NOTE — Progress Notes (Signed)
  Care Coordination   Note   12/03/2022 Name: [name] MRN: [id number] DOB: 1951-06-21  Carla [name] is a 72 y.o. year old female who sees [name], [name], MD for primary care. I reached out to Carla [name] by phone today to offer care coordination services.  [name] was given information about Care Coordination services today including:   The Care Coordination services include support from the care team which includes your Nurse Coordinator, Clinical Social Worker, or Pharmacist.  The Care Coordination team is here to help remove barriers to the health concerns and goals most important to you. Care Coordination services are voluntary, and the patient [name] decline or stop services at any time by request to their care team member.   Care Coordination Consent Status: Patient agreed to services and verbal consent obtained.   Follow up plan:  Telephone appointment with care coordination team member scheduled for:  12/06/22  Encounter Outcome:  Pt. Scheduled [place]  Direct Dial: [phone]

## 2022-12-07 ENCOUNTER — Inpatient Hospital Stay: Payer: PPO | Attending: Internal Medicine

## 2022-12-07 ENCOUNTER — Inpatient Hospital Stay: Payer: PPO

## 2022-12-07 ENCOUNTER — Encounter: Payer: Self-pay | Admitting: Internal Medicine

## 2022-12-07 ENCOUNTER — Inpatient Hospital Stay (HOSPITAL_BASED_OUTPATIENT_CLINIC_OR_DEPARTMENT_OTHER): Payer: PPO | Admitting: Internal Medicine

## 2022-12-07 VITALS — BP 117/72 | HR 72 | Temp 97.5°F | Resp 18 | Wt 148.8 lb

## 2022-12-07 DIAGNOSIS — D461 Refractory anemia with ring sideroblasts: Secondary | ICD-10-CM | POA: Insufficient documentation

## 2022-12-07 DIAGNOSIS — N183 Chronic kidney disease, stage 3 unspecified: Secondary | ICD-10-CM | POA: Diagnosis not present

## 2022-12-07 DIAGNOSIS — D46Z Other myelodysplastic syndromes: Secondary | ICD-10-CM

## 2022-12-07 LAB — COMPREHENSIVE METABOLIC PANEL
ALT: 17 U/L (ref 0–44)
AST: 22 U/L (ref 15–41)
Albumin: 4.4 g/dL (ref 3.5–5.0)
Alkaline Phosphatase: 43 U/L (ref 38–126)
Anion gap: 8 (ref 5–15)
BUN: 21 mg/dL (ref 8–23)
CO2: 26 mmol/L (ref 22–32)
Calcium: 9.4 mg/dL (ref 8.9–10.3)
Chloride: 103 mmol/L (ref 98–111)
Creatinine, Ser: 1.09 mg/dL — ABNORMAL HIGH (ref 0.44–1.00)
GFR, Estimated: 54 mL/min — ABNORMAL LOW (ref >60)
Glucose, Bld: 118 mg/dL — ABNORMAL HIGH (ref 70–99)
Potassium: 4 mmol/L (ref 3.5–5.1)
Sodium: 137 mmol/L (ref 135–145)
Total Bilirubin: 1.2 mg/dL (ref 0.3–1.2)
Total Protein: 8.5 g/dL — ABNORMAL HIGH (ref 6.5–8.1)

## 2022-12-07 LAB — CBC WITH DIFFERENTIAL/PLATELET
Abs Immature Granulocytes: 0.33 10*3/uL — ABNORMAL HIGH (ref 0.00–0.07)
Basophils Absolute: 0.1 10*3/uL (ref 0.0–0.1)
Basophils Relative: 1 %
Eosinophils Absolute: 0.1 10*3/uL (ref 0.0–0.5)
Eosinophils Relative: 1 %
HCT: 31.7 % — ABNORMAL LOW (ref 36.0–46.0)
Hemoglobin: 10 g/dL — ABNORMAL LOW (ref 12.0–15.0)
Immature Granulocytes: 4 %
Lymphocytes Relative: 24 %
Lymphs Abs: 2 10*3/uL (ref 0.7–4.0)
MCH: 30.7 pg (ref 26.0–34.0)
MCHC: 31.5 g/dL (ref 30.0–36.0)
MCV: 97.2 fL (ref 80.0–100.0)
Monocytes Absolute: 1.5 10*3/uL — ABNORMAL HIGH (ref 0.1–1.0)
Monocytes Relative: 18 %
Neutro Abs: 4.3 10*3/uL (ref 1.7–7.7)
Neutrophils Relative %: 52 %
Platelets: 267 10*3/uL (ref 150–400)
RBC: 3.26 MIL/uL — ABNORMAL LOW (ref 3.87–5.11)
RDW: 30.4 % — ABNORMAL HIGH (ref 11.5–15.5)
Smear Review: ADEQUATE
WBC: 8.4 10*3/uL (ref 4.0–10.5)
nRBC: 1.2 % — ABNORMAL HIGH (ref 0.0–0.2)

## 2022-12-07 LAB — LACTATE DEHYDROGENASE: LDH: 124 U/L (ref 98–192)

## 2022-12-07 MED ORDER — LUSPATERCEPT-AAMT 75 MG ~~LOC~~ SOLR
1.0000 mg/kg | Freq: Once | SUBCUTANEOUS | Status: AC
  Administered 2022-12-07: 65 mg via SUBCUTANEOUS
  Filled 2022-12-07: qty 1.3

## 2022-12-07 NOTE — Progress Notes (Signed)
Pt having chronic leg aching and shoulder ache. Takes tylenol. Appetite is fairly good. Pt asking if she can take claritin to help with bone pain. Pt states she does have brain fog at times. Denies dizziness. No visible bleeding.

## 2022-12-07 NOTE — Progress Notes (Signed)
[place] NOTE  Patient Care Team: [name], Carla Fanny, MD as PCP - General Rogue Bussing, Carla Headland, MD as Consulting Physician (Hematology and Oncology) Dannielle Karvonen, RN as [place] Management  CHIEF COMPLAINTS/PURPOSE OF CONSULTATION: MDS   Oncology History Overview Note   # July 2020-myelodysplastic syndrome with ringed sideroblasts-Normal karyotype; no blasts [IPSS R-Very low risk ~median survival 8.3 years]; iron studies 123456 folic acid myeloma panel normal; pyridoxine levels/copper/zinc-WNL. Erythropoietin levels-60. II OPINION at [facility] [Dr.DeCastro]  # [facility]/ NGS: TET2([id number])c.2524delT(p.Ser842GlnfsTer31) Exon 3 frame-shift SF3B1([id number])c.2098A>G(p.Lys700Glu) Exon 15 missense  DNMT3A(NM_[id number])c.2204A>G(p.Tyr735Cys) Exon 19 missense   # JAN 11th 2020- Aranesp/retacrit;  # July 30th, 2021- START REVLIMID 10 mg/day  [SF3B70mtation]  MARCH 2023- BONE MARROW, ASPIRATE, CLOT, CORE:  -  Hypercellular marrow involved by myelodysplastic syndrome with ring  sideroblasts (MDS-RS)  -  Polytypic plasmacytosis   # MARCh 2023-increase the frequency of Aranesp 400 mcg every 2 weeks; continue Revlimid.   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; September 2021 ejection fraction 60 to 65%;   # colonoscopy- 2016 [Dr.Bucinni];  DIAGNOSIS: MDS/low-grade with ringed sideroblasts  STAGE: Low       ;  GOALS: Control  CURRENT/MOST RECENT THERAPY : EPO agent+ REV    MDS (myelodysplastic syndrome), low grade ([facility]  04/23/2019 Initial Diagnosis   MDS (myelodysplastic syndrome), low grade ([facility]   05/11/2022 - 05/11/2022 Chemotherapy   Patient is on Treatment Plan : MYELODYSPLASIA Luspatercept q21d     05/11/2022 -  Chemotherapy   Patient is on Treatment Plan : MYELODYSPLASIA Luspatercept q21d      HISTORY OF PRESENTING ILLNESS: Accompanied by husband ambulating  independently.  [name] 72 y.o.  female history of low-grade MDS anemia with ring sideroblasts currently on Aranesp/Luspatercept is here for follow-up.    Pt having chronic leg aching and shoulder ache. Takes tylenol.  Pt asking if she can take claritin to help with bone pain.   Pt states she does have brain fog at times. Denies dizziness. No visible bleeding   Appetite is fairly good. Still taking Calcium+D with the prescription Vitamin D.   Does have dyspnea with exertion/ steps.  Review of Systems  Constitutional:  Positive for malaise/fatigue. Negative for chills, diaphoresis and fever.  HENT:  Negative for nosebleeds and sore throat.   Eyes:  Negative for double vision.  Respiratory:  Negative for cough, hemoptysis, sputum production, shortness of breath and wheezing.   Cardiovascular:  Negative for chest pain and orthopnea.  Gastrointestinal:  Negative for abdominal pain, blood in stool, constipation, diarrhea, heartburn, melena and vomiting.  Genitourinary:  Negative for dysuria, frequency and urgency.  Musculoskeletal:  Positive for back pain and joint pain.  Skin: Negative.  Negative for itching and rash.  Neurological:  Negative for dizziness, tingling, focal weakness, weakness and headaches.  Endo/Heme/Allergies:  Does not bruise/bleed easily.  Psychiatric/Behavioral:  Negative for depression. The patient is not nervous/anxious and does not have insomnia.     MEDICAL HISTORY:  Past Medical History:  Diagnosis Date   Allergic rhinitis, cause unspecified    Anemia, unspecified    Carpal tunnel syndrome    Cystitis, unspecified    Family history of osteoporosis    PONV (postoperative nausea and vomiting)    Postnasal drip    Syncope and collapse     SURGICAL HISTORY: Past Surgical History:  Procedure Laterality Date   BONE MARROW BIOPSY  CARPAL TUNNEL RELEASE     COLONOSCOPY  09/28/2003   COLONOSCOPY WITH PROPOFOL N/A 07/31/2015   Procedure: COLONOSCOPY  WITH PROPOFOL;  Surgeon: Ronald Lobo, MD;  Location: [facility];  Service: Endoscopy;  Laterality: N/A;   DIAGNOSTIC LAPAROSCOPY     dx lack of pregnancy    EYE SURGERY     lasik, cataract, prk,yag procedure    SOCIAL HISTORY: Social History   Socioeconomic History   Marital status: Married    Spouse name: Not on file   Number of children: Not on file   Years of education: Not on file   Highest education level: Not on file  Occupational History   Not on file  Tobacco Use   Smoking status: Never   Smokeless tobacco: Never  Vaping Use   Vaping Use: Never used  Substance and Sexual Activity   Alcohol use: Yes    Alcohol/week: 0.0 standard drinks of alcohol    Comment: Rare   Drug use: No   Sexual activity: Not Currently  Other Topics Concern   Not on file  Social History Narrative   No regular exercise      Drinks lots of [employer]         Social Determinants of Health   Financial Resource Strain: Low Risk  (05/20/2022)   Overall Financial Resource Strain (CARDIA)    Difficulty of Paying Living Expenses: Not very hard  Food Insecurity: No Food Insecurity (05/20/2022)   Hunger Vital Sign    Worried About Running Out of Food in the Last Year: Never true    Ran Out of Food in the Last Year: Never true  Transportation Needs: No Transportation Needs (05/20/2022)   PRAPARE - Hydrologist (Medical): No    Lack of Transportation (Non-Medical): No  Physical Activity: Inactive (05/20/2022)   Exercise Vital Sign    Days of Exercise per Week: 0 days    Minutes of Exercise per Session: 0 min  Stress: No Stress Concern Present (05/20/2022)   [place]    Feeling of Stress : Only a little  Social Connections: Moderately Integrated (05/20/2022)   Social Connection and Isolation Panel [NHANES]    Frequency of Communication with Friends and Family: More than three times a week     Frequency of Social Gatherings with Friends and Family: Once a week    Attends Religious Services: More than 4 times per year    Active Member of [employer] or Organizations: No    Attends Archivist Meetings: Never    Marital Status: Married  Human resources officer Violence: Not At Risk (05/20/2022)   Humiliation, Afraid, Rape, and Kick questionnaire    Fear of Current or Ex-Partner: No    Emotionally Abused: No    Physically Abused: No    Sexually Abused: No    FAMILY HISTORY: Family History  Problem Relation Age of Onset   Osteoporosis Mother    Stroke Mother    Coronary artery disease Father    Stroke Father 43   Diabetes Other        Aunts and uncles   Breast cancer Paternal Aunt    Breast cancer Maternal Aunt    Arthritis/Rheumatoid Child    Breast cancer Cousin     ALLERGIES:  is allergic to codeine.  MEDICATIONS:  Current Outpatient Medications  Medication Sig Dispense Refill   acetaminophen (TYLENOL) 500 MG tablet Take 500-1,000 mg by mouth  every 6 (six) hours as needed for mild pain or headache.     aspirin EC 81 MG tablet Take 81 mg by mouth at bedtime. Swallow whole.      Biotin 10 MG CAPS Take by mouth.     calcium-vitamin D (OSCAL WITH D) 500-5 MG-MCG tablet Take 1 tablet by mouth daily with breakfast.     Darbepoetin Alfa (ARANESP) 500 MCG/ML SOSY injection Inject 500 mcg into the skin See admin instructions. Inject 500 mcg into the skin every week     ergocalciferol (VITAMIN D2) 1.25 MG (50000 UT) capsule Take 1 capsule (50,000 Units total) by mouth once a week. 12 capsule 1   levothyroxine (SYNTHROID) 25 MCG tablet TAKE 1 TABLET BY MOUTH ONCE A DAY BEFOREBREAKFAST. 90 tablet 3   ondansetron (ZOFRAN ODT) 8 MG disintegrating tablet Take 1 tablet (8 mg total) by mouth every 8 (eight) hours as needed for nausea or vomiting. (Patient not taking: Reported on 10/26/2022) 30 tablet 0   No current facility-administered medications for this visit.      PHYSICAL  EXAMINATION:   Vitals:   12/07/22 0953  BP: 117/72  Pulse: 72  Resp: 18  Temp: (!) 97.5 F (36.4 C)  SpO2: 98%     Filed Weights   12/07/22 0953  Weight: 148 lb 12.8 oz (67.5 kg)      Physical Exam HENT:     Head: Normocephalic and atraumatic.     Mouth/Throat:     Pharynx: No oropharyngeal exudate.  Eyes:     Pupils: Pupils are equal, round, and reactive to light.  Cardiovascular:     Rate and Rhythm: Normal rate and regular rhythm.     Pulses: Normal pulses.     Heart sounds: Normal heart sounds.  Pulmonary:     Effort: Pulmonary effort is normal. No respiratory distress.     Breath sounds: Normal breath sounds. No wheezing.  Abdominal:     General: Bowel sounds are normal. There is no distension.     Palpations: Abdomen is soft. There is no mass.     Tenderness: There is no abdominal tenderness. There is no guarding or rebound.  Musculoskeletal:        General: No tenderness. Normal range of motion.     Cervical back: Normal range of motion and neck supple.  Skin:    General: Skin is warm.  Neurological:     Mental Status: She is alert and oriented to person, place, and time.  Psychiatric:        Mood and Affect: Affect normal.     LABORATORY DATA:  I have reviewed the data as listed Lab Results  Component Value Date   WBC 8.4 12/07/2022   HGB 10.0 (L) 12/07/2022   HCT 31.7 (L) 12/07/2022   MCV 97.2 12/07/2022   PLT 267 12/07/2022   Recent Labs    10/26/22 0942 11/16/22 0847 12/07/22 0930  NA 136 137 137  K 3.7 4.1 4.0  CL 103 105 103  CO2 '22 23 26  '$ GLUCOSE 165* 130* 118*  BUN '23 21 21  '$ CREATININE 1.11* 1.07* 1.09*  CALCIUM 9.0 9.2 9.4  GFRNONAA 53* 55* 54*  PROT 8.4* 8.7* 8.5*  ALBUMIN 4.1 4.2 4.4  AST '24 26 22  '$ ALT '13 17 17  '$ ALKPHOS 43 44 43  BILITOT 0.8 0.9 1.2     No results found.  MDS (myelodysplastic syndrome), low grade (HCC) # Low grade MDS- with ringed sideroblasts; no  blasts. POSITIVE  For SF3B1; R-IPSS-low risk.  MARCH 2023-bone marrow biopsy no evidence of obvious progression of MDS or any acute process.  Currently on Aranesp 100 mcg every 2-3 week plus Luspatercept.   # Luspatercept # 11 today [given ongoing arthralgias/myalgias see below]. Continue with Aranesp prn. Today- Hb 10.0. today- stable  # bilateral shoulder pain [hx of right rotator cuff tear]; however worsening myalgias- UNLIKELY sec to Luspatercept. CK levels- WNL. FEB 2024-Vitamin D- 25/low-  on Vit D 50, Calcium 8.9. Left hip pain- sec to [facility]- s/p [facility] ortho [facility]-recommend taking Tylenol as needed.  And also reasonable to try Claritin for bony pain.   # CKD- stage III- 39s- monitor for now- stable  # Vaccination: s/p flu shot. Consider/recommend COVID/ RSV  # DISPOSITION:  # proceed with Luspatecept  Today; HOLD  Aranesp today  # follow up in 3 weeks--MD;  labs CBC/cmp; LDH;  possible aranesp; Luspatercept  SQ;   Dr.B  All questions were answered. The patient knows to call the clinic with any problems, questions or concerns.   Cammie Sickle, MD 12/07/2022 1:08 PM

## 2022-12-07 NOTE — Assessment & Plan Note (Addendum)
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 MARCH 2023-bone marrow biopsy no evidence of obvious progression of MDS or any acute process.  Currently on Aranesp 100 mcg every 2-3 week plus Luspatercept.   # Luspatercept # 11 today [given ongoing arthralgias/myalgias see below]. Continue with Aranesp prn. Today- Hb 10.0. today- stable  # bilateral shoulder pain [hx of right rotator cuff tear]; however worsening myalgias- UNLIKELY sec to Luspatercept. CK levels- WNL. FEB 2024-Vitamin D- 25/low-  on Vit D 50, Calcium 8.9. Left hip pain- sec to [facility]- s/p [facility] ortho [facility]-recommend taking Tylenol as needed.  And also reasonable to try Claritin for bony pain.   # CKD- stage III- 42s- monitor for now- stable  # Vaccination: s/p flu shot. Consider/recommend COVID/ RSV  # DISPOSITION:  # proceed with Luspatecept  Today; HOLD  Aranesp today  # follow up in 3 weeks--MD;  labs CBC/cmp; LDH;  possible aranesp; Luspatercept  SQ;   Dr.B

## 2022-12-08 ENCOUNTER — Telehealth: Payer: Self-pay | Admitting: *Deleted

## 2022-12-08 NOTE — Progress Notes (Signed)
  Care Coordination Note  12/08/2022 Name: Carla [name] MRN: [id number] DOB: 07-Mar-1951  Carla [name] is a 72 y.o. year old female who is a primary care patient of [name], [name], MD and is actively engaged with the care management team. I reached out to Carla [name] by phone today to assist with re-scheduling a follow up visit with the RN Case Manager  Follow up plan: Unsuccessful telephone outreach attempt made. A HIPAA compliant phone message was left for the patient providing contact information and requesting a return call.   Julian Hy, [place] Direct Dial: [phone]

## 2022-12-09 DIAGNOSIS — L930 Discoid lupus erythematosus: Secondary | ICD-10-CM | POA: Diagnosis not present

## 2022-12-09 DIAGNOSIS — D2262 Melanocytic nevi of left upper limb, including shoulder: Secondary | ICD-10-CM | POA: Diagnosis not present

## 2022-12-09 DIAGNOSIS — L309 Dermatitis, unspecified: Secondary | ICD-10-CM | POA: Diagnosis not present

## 2022-12-09 DIAGNOSIS — D2261 Melanocytic nevi of right upper limb, including shoulder: Secondary | ICD-10-CM | POA: Diagnosis not present

## 2022-12-09 DIAGNOSIS — D2272 Melanocytic nevi of left lower limb, including hip: Secondary | ICD-10-CM | POA: Diagnosis not present

## 2022-12-09 DIAGNOSIS — L821 Other seborrheic keratosis: Secondary | ICD-10-CM | POA: Diagnosis not present

## 2022-12-09 DIAGNOSIS — D225 Melanocytic nevi of trunk: Secondary | ICD-10-CM | POA: Diagnosis not present

## 2022-12-09 DIAGNOSIS — D2271 Melanocytic nevi of right lower limb, including hip: Secondary | ICD-10-CM | POA: Diagnosis not present

## 2022-12-09 DIAGNOSIS — D485 Neoplasm of uncertain behavior of skin: Secondary | ICD-10-CM | POA: Diagnosis not present

## 2022-12-10 NOTE — Progress Notes (Signed)
  Care Coordination Note  12/10/2022 Name: Carla [name] MRN: [id number] DOB: 1951-05-22  Carla [name] is a 72 y.o. year old female who is a primary care patient of [name], [name], MD and is actively engaged with the care management team. I reached out to Carla [name] by phone today to assist with re-scheduling an initial visit with the RN Case Manager  Follow up plan: Patient declines further follow up and engagement by the care management team. Appropriate care team members and provider have been notified via electronic communication.   Julian Hy, [place] Direct Dial: [phone]

## 2022-12-17 DIAGNOSIS — L930 Discoid lupus erythematosus: Secondary | ICD-10-CM | POA: Diagnosis not present

## 2022-12-28 ENCOUNTER — Inpatient Hospital Stay (HOSPITAL_BASED_OUTPATIENT_CLINIC_OR_DEPARTMENT_OTHER): Payer: PPO | Admitting: Internal Medicine

## 2022-12-28 ENCOUNTER — Encounter: Payer: Self-pay | Admitting: Internal Medicine

## 2022-12-28 ENCOUNTER — Inpatient Hospital Stay: Payer: PPO

## 2022-12-28 ENCOUNTER — Inpatient Hospital Stay: Payer: PPO | Attending: Internal Medicine

## 2022-12-28 VITALS — BP 122/68 | HR 70 | Temp 97.6°F | Resp 16 | Wt 150.0 lb

## 2022-12-28 DIAGNOSIS — Z803 Family history of malignant neoplasm of breast: Secondary | ICD-10-CM | POA: Insufficient documentation

## 2022-12-28 DIAGNOSIS — D46Z Other myelodysplastic syndromes: Secondary | ICD-10-CM

## 2022-12-28 DIAGNOSIS — N183 Chronic kidney disease, stage 3 unspecified: Secondary | ICD-10-CM | POA: Insufficient documentation

## 2022-12-28 DIAGNOSIS — L930 Discoid lupus erythematosus: Secondary | ICD-10-CM | POA: Diagnosis not present

## 2022-12-28 DIAGNOSIS — E559 Vitamin D deficiency, unspecified: Secondary | ICD-10-CM | POA: Insufficient documentation

## 2022-12-28 DIAGNOSIS — D461 Refractory anemia with ring sideroblasts: Secondary | ICD-10-CM | POA: Diagnosis not present

## 2022-12-28 LAB — CMP (CANCER CENTER ONLY)
ALT: 14 U/L (ref 0–44)
AST: 20 U/L (ref 15–41)
Albumin: 4.2 g/dL (ref 3.5–5.0)
Alkaline Phosphatase: 44 U/L (ref 38–126)
Anion gap: 9 (ref 5–15)
BUN: 22 mg/dL (ref 8–23)
CO2: 23 mmol/L (ref 22–32)
Calcium: 9.1 mg/dL (ref 8.9–10.3)
Chloride: 103 mmol/L (ref 98–111)
Creatinine: 1.07 mg/dL — ABNORMAL HIGH (ref 0.44–1.00)
GFR, Estimated: 55 mL/min — ABNORMAL LOW (ref >60)
Glucose, Bld: 126 mg/dL — ABNORMAL HIGH (ref 70–99)
Potassium: 3.8 mmol/L (ref 3.5–5.1)
Sodium: 135 mmol/L (ref 135–145)
Total Bilirubin: 0.7 mg/dL (ref 0.3–1.2)
Total Protein: 8.2 g/dL — ABNORMAL HIGH (ref 6.5–8.1)

## 2022-12-28 LAB — CBC WITH DIFFERENTIAL (CANCER CENTER ONLY)
Abs Immature Granulocytes: 0.71 10*3/uL — ABNORMAL HIGH (ref 0.00–0.07)
Basophils Absolute: 0.1 10*3/uL (ref 0.0–0.1)
Basophils Relative: 1 %
Eosinophils Absolute: 0.1 10*3/uL (ref 0.0–0.5)
Eosinophils Relative: 1 %
HCT: 30 % — ABNORMAL LOW (ref 36.0–46.0)
Hemoglobin: 9.5 g/dL — ABNORMAL LOW (ref 12.0–15.0)
Immature Granulocytes: 6 %
Lymphocytes Relative: 21 %
Lymphs Abs: 2.6 10*3/uL (ref 0.7–4.0)
MCH: 30.9 pg (ref 26.0–34.0)
MCHC: 31.7 g/dL (ref 30.0–36.0)
MCV: 97.7 fL (ref 80.0–100.0)
Monocytes Absolute: 1.6 10*3/uL — ABNORMAL HIGH (ref 0.1–1.0)
Monocytes Relative: 14 %
Neutro Abs: 6.9 10*3/uL (ref 1.7–7.7)
Neutrophils Relative %: 57 %
Platelet Count: 247 10*3/uL (ref 150–400)
RBC: 3.07 MIL/uL — ABNORMAL LOW (ref 3.87–5.11)
RDW: 30.5 % — ABNORMAL HIGH (ref 11.5–15.5)
WBC Count: 12 10*3/uL — ABNORMAL HIGH (ref 4.0–10.5)
nRBC: 1.9 % — ABNORMAL HIGH (ref 0.0–0.2)

## 2022-12-28 LAB — LACTATE DEHYDROGENASE: LDH: 134 U/L (ref 98–192)

## 2022-12-28 MED ORDER — LUSPATERCEPT-AAMT 75 MG ~~LOC~~ SOLR
1.0000 mg/kg | Freq: Once | SUBCUTANEOUS | Status: AC
  Administered 2022-12-28: 65 mg via SUBCUTANEOUS
  Filled 2022-12-28: qty 1.3

## 2022-12-28 MED ORDER — DARBEPOETIN ALFA 500 MCG/ML IJ SOSY
500.0000 ug | PREFILLED_SYRINGE | Freq: Once | INTRAMUSCULAR | Status: AC
  Administered 2022-12-28: 500 ug via SUBCUTANEOUS

## 2022-12-28 NOTE — Progress Notes (Signed)
Pt and husband in for follow up, pt reports increased pain in hips, shoulders, "all over" but worse in right hip. Pt also reports she recently saw Dr Kellie Moor, dermatologist who sent a sample of an area on her face  which came back positive for lupus and MD had pt have lab drawn at [place] for Lupus and pt has not been notified of results yet.

## 2022-12-28 NOTE — Assessment & Plan Note (Addendum)
Tachycardic # Low grade MDS- with ringed sideroblasts; no blasts. POSITIVE  For SF3B1; R-IPSS-low risk. MARCH 2023-bone marrow biopsy no evidence of obvious progression of MDS or any acute process.  Currently on Aranesp 100 mcg every 2-3 week plus Luspatercept.   # proceed with Luspatercept # 12 today  Continue with Aranesp prn. Today- Hb 9.5-overall stable.  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   # vit D def- FEB 2024-Vitamin D- 25/low-  on Vit D 50-continue vitamin D weekly.  Will recheck in couple of months.  # CKD- stage III- 50s- monitor for now- stable  # Vaccination: s/p flu shot. Consider/recommend COVID/ RSV  # DISPOSITION:  # proceed with Luspatecept  Today; ok with Aranesp today # follow up in 3 weeks--MD;  labs CBC/cmp; LDH;  possible aranesp; Luspatercept  SQ-  Dr.B

## 2022-12-28 NOTE — Progress Notes (Signed)
[facility] CONSULT NOTE  Patient Care Team: [name], [name], MD as PCP - General Rogue Bussing, [name], MD as Consulting Physician (Hematology and Oncology)  CHIEF COMPLAINTS/PURPOSE OF CONSULTATION: MDS   Oncology History Overview Note   # July 2020-myelodysplastic syndrome with ringed sideroblasts-Normal karyotype; no blasts [IPSS R-Very low risk ~median survival 8.3 years]; iron studies 123456 folic acid myeloma panel normal; pyridoxine levels/copper/zinc-WNL. Erythropoietin levels-60. 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; September 2021 ejection fraction 60 to 65%;   # colonoscopy- 2016 [Dr.Bucinni];  DIAGNOSIS: MDS/low-grade with ringed sideroblasts  STAGE: Low       ;  GOALS: Control  CURRENT/MOST RECENT THERAPY : EPO agent+ REV    MDS (myelodysplastic syndrome), low grade  04/23/2019 Initial Diagnosis   MDS (myelodysplastic syndrome), low grade ([facility])   05/11/2022 - 05/11/2022 Chemotherapy   Patient is on Treatment Plan : MYELODYSPLASIA Luspatercept q21d     05/11/2022 -  Chemotherapy   Patient is on Treatment Plan : MYELODYSPLASIA Luspatercept q21d      HISTORY OF PRESENTING ILLNESS: Accompanied by husband ambulating independently.  Carla [name] 71 y.o.  female history of low-grade MDS anemia with  ring sideroblasts currently on Aranesp/Luspatercept is here for follow-up.    Patient reports increased pain in hips, shoulders, "all over" but worse in right hip.  Pt also reports she recently saw Dr Kellie Moor, dermatologist who sent a sample of an area on her face  which came back positive for lupus.  Patient is currently awaiting blood work for systemic lupus.  Appetite is fairly good. Still taking Calcium+D with the prescription Vitamin D.   Review of Systems  Constitutional:  Positive for malaise/fatigue. Negative for chills, diaphoresis and fever.  HENT:  Negative for nosebleeds and sore throat.   Eyes:  Negative for double vision.  Respiratory:  Negative for cough, hemoptysis, sputum production, shortness of breath and wheezing.   Cardiovascular:  Negative for chest pain and orthopnea.  Gastrointestinal:  Negative for abdominal pain, blood in stool, constipation, diarrhea, heartburn, melena and vomiting.  Genitourinary:  Negative for dysuria, frequency and urgency.  Musculoskeletal:  Positive for back pain and joint pain.  Skin: Negative.  Negative for itching and rash.  Neurological:  Negative for dizziness, tingling, focal weakness, weakness and headaches.  Endo/Heme/Allergies:  Does not bruise/bleed easily.  Psychiatric/Behavioral:  Negative for depression. The patient is not nervous/anxious and does not have insomnia.     MEDICAL HISTORY:  Past Medical History:  Diagnosis Date   Allergic rhinitis, cause unspecified    Anemia, unspecified    Carpal tunnel syndrome    Cystitis, unspecified    Family history of osteoporosis    PONV (postoperative nausea and vomiting)    Postnasal drip    Syncope and collapse     SURGICAL HISTORY: Past Surgical History:  Procedure Laterality Date   BONE MARROW BIOPSY     CARPAL TUNNEL RELEASE  COLONOSCOPY  09/28/2003   COLONOSCOPY WITH PROPOFOL N/A 07/31/2015   Procedure: COLONOSCOPY WITH PROPOFOL;  Surgeon: Ronald Lobo, MD;   Location: [facility];  Service: Endoscopy;  Laterality: N/A;   DIAGNOSTIC LAPAROSCOPY     dx lack of pregnancy    EYE SURGERY     lasik, cataract, prk,yag procedure    SOCIAL HISTORY: Social History   Socioeconomic History   Marital status: Married    Spouse name: Not on file   Number of children: Not on file   Years of education: Not on file   Highest education level: Not on file  Occupational History   Not on file  Tobacco Use   Smoking status: Never   Smokeless tobacco: Never  Vaping Use   Vaping Use: Never used  Substance and Sexual Activity   Alcohol use: Yes    Alcohol/week: 0.0 standard drinks of alcohol    Comment: Rare   Drug use: No   Sexual activity: Not Currently  Other Topics Concern   Not on file  Social History Narrative   No regular exercise      Drinks lots of [employer]eps[employer]         Social Determinants of Health   Financial Resource Strain: Low Risk  (05/20/2022)   Overall Financial Resource Strain (CARDIA)    Difficulty of Paying Living Expenses: Not very hard  Food Insecurity: No Food Insecurity (05/20/2022)   Hunger Vital Sign    Worried About Running Out of Food in the Last Year: Never true    Ran Out of Food in the Last Year: Never true  Transportation Needs: No Transportation Needs (05/20/2022)   PRAPARE - Hydrologist (Medical): No    Lack of Transportation (Non-Medical): No  Physical Activity: Inactive (05/20/2022)   Exercise Vital Sign    Days of Exercise per Week: 0 days    Minutes of Exercise per Session: 0 min  Stress: No Stress Concern Present (05/20/2022)   [place]    Feeling of Stress : Only a little  Social Connections: Moderately Integrated (05/20/2022)   Social Connection and Isolation Panel [NHANES]    Frequency of Communication with Friends and Family: More than three times a week    Frequency of Social Gatherings with Friends and  Family: Once a week    Attends Religious Services: More than 4 times per year    Active Member of [employer] or Organizations: No    Attends Archivist Meetings: Never    Marital Status: Married  Human resources officer Violence: Not At Risk (05/20/2022)   Humiliation, Afraid, Rape, and Kick questionnaire    Fear of Current or Ex-Partner: No    Emotionally Abused: No    Physically Abused: No    Sexually Abused: No    FAMILY HISTORY: Family History  Problem Relation Age of Onset   Osteoporosis Mother    Stroke Mother    Coronary artery disease Father    Stroke Father 74   Diabetes Other        Aunts and uncles   Breast cancer Paternal Aunt    Breast cancer Maternal Aunt    Arthritis/Rheumatoid Child    Breast cancer Cousin     ALLERGIES:  is allergic to codeine.  MEDICATIONS:  Current Outpatient Medications  Medication Sig Dispense Refill   acetaminophen (TYLENOL) 500 MG tablet Take 500-1,000 mg by mouth every 6 (six) hours as needed for  mild pain or headache.     aspirin EC 81 MG tablet Take 81 mg by mouth at bedtime. Swallow whole.      Biotin 10 MG CAPS Take by mouth.     calcium-vitamin D (OSCAL WITH D) 500-5 MG-MCG tablet Take 1 tablet by mouth daily with breakfast.     clobetasol (TEMOVATE) 0.05 % external solution Apply topically.     Darbepoetin Alfa (ARANESP) 500 MCG/ML SOSY injection Inject 500 mcg into the skin See admin instructions. Inject 500 mcg into the skin every week     desonide (DESOWEN) 0.05 % cream Apply topically.     ergocalciferol (VITAMIN D2) 1.25 MG (50000 UT) capsule Take 1 capsule (50,000 Units total) by mouth once a week. 12 capsule 1   levothyroxine (SYNTHROID) 25 MCG tablet TAKE 1 TABLET BY MOUTH ONCE A DAY BEFOREBREAKFAST. 90 tablet 3   ondansetron (ZOFRAN ODT) 8 MG disintegrating tablet Take 1 tablet (8 mg total) by mouth every 8 (eight) hours as needed for nausea or vomiting. 30 tablet 0   No current facility-administered medications for this  visit.   Facility-Administered Medications Ordered in Other Visits  Medication Dose Route Frequency Provider Last Rate Last Admin   luspatercept-aamt (REBLOZYL) subcutaneous injection 65 mg  1 mg/kg (Treatment Plan Recorded) Subcutaneous Once Earna Coder, MD          PHYSICAL EXAMINATION:   Vitals:   12/28/22 1533  BP: 122/68  Pulse: 70  Resp: 16  Temp: 97.6 F (36.4 C)  SpO2: 98%     Filed Weights   12/28/22 1533  Weight: 150 lb (68 kg)      Physical Exam HENT:     Head: Normocephalic and atraumatic.     Mouth/Throat:     Pharynx: No oropharyngeal exudate.  Eyes:     Pupils: Pupils are equal, round, and reactive to light.  Cardiovascular:     Rate and Rhythm: Normal rate and regular rhythm.     Pulses: Normal pulses.     Heart sounds: Normal heart sounds.  Pulmonary:     Effort: Pulmonary effort is normal. No respiratory distress.     Breath sounds: Normal breath sounds. No wheezing.  Abdominal:     General: Bowel sounds are normal. There is no distension.     Palpations: Abdomen is soft. There is no mass.     Tenderness: There is no abdominal tenderness. There is no guarding or rebound.  Musculoskeletal:        General: No tenderness. Normal range of motion.     Cervical back: Normal range of motion and neck supple.  Skin:    General: Skin is warm.  Neurological:     Mental Status: She is alert and oriented to person, place, and time.  Psychiatric:        Mood and Affect: Affect normal.     LABORATORY DATA:  I have reviewed the data as listed Lab Results  Component Value Date   WBC 12.9 (H) 01/18/2023   HGB 10.2 (L) 01/18/2023   HCT 33.3 (L) 01/18/2023   MCV 99.7 01/18/2023   PLT 264 01/18/2023   Recent Labs    12/07/22 0930 12/28/22 1457 01/18/23 1443  NA 137 135 137  K 4.0 3.8 4.3  CL 103 103 104  CO2 GLUCOSE 118* 126* 150*  BUN CREATININE 1.09* 1.07* 1.09*  CALCIUM 9.4 9.1 9.3  GFRNONAA 54* 55* 54*  PROT 8.5* 8.2* 8.4*  ALBUMIN 4.4 4.2 4.2  AST ALT ALKPHOS 43 44 49  BILITOT 1.2 0.7 0.5     No results found.  MDS (myelodysplastic syndrome), low grade ([facility]) Tachycardic # Low grade MDS- with ringed sideroblasts; no blasts. POSITIVE  For SF3B1; R-IPSS-low risk. MARCH 2023-bone marrow biopsy no evidence of obvious progression of MDS or any acute process.  Currently on Aranesp 100 mcg every 2-3 week plus Luspatercept.   # proceed with Luspatercept # 12 today  Continue with Aranesp prn. Today- Hb 9.5-overall stable.  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   # vit D def- FEB 2024-Vitamin D- 25/low-  on Vit D 50-continue vitamin D weekly.  Will recheck in couple of months.  # CKD- stage III- 50s- monitor for now- stable  # Vaccination: s/p flu shot. Consider/recommend COVID/ RSV  # DISPOSITION:  # proceed with Luspatecept  Today; ok with Aranesp today # follow up in 3 weeks--MD;  labs CBC/cmp; LDH;  possible aranesp; Luspatercept  SQ-  Dr.B  All questions were answered. The patient knows to call the clinic with any problems, questions or concerns.   Earna Coder, MD 01/18/2023 3:25 PM

## 2023-01-11 ENCOUNTER — Encounter: Admit: 2023-01-11 | Discharge: 2023-01-11 | Payer: MEDICARE

## 2023-01-11 NOTE — Telephone Encounter
Called the patient to reschedule her appointment with Dr. Thomasena Edis on 01/17/2023 and the phone number on file is not longer in service.  Will attempt to contact the patient via mychart and email.

## 2023-01-17 ENCOUNTER — Encounter: Admit: 2023-01-17 | Discharge: 2023-01-17 | Payer: MEDICARE

## 2023-01-18 ENCOUNTER — Inpatient Hospital Stay: Payer: PPO

## 2023-01-18 ENCOUNTER — Encounter: Payer: Self-pay | Admitting: Internal Medicine

## 2023-01-18 ENCOUNTER — Inpatient Hospital Stay (HOSPITAL_BASED_OUTPATIENT_CLINIC_OR_DEPARTMENT_OTHER): Payer: PPO | Admitting: Internal Medicine

## 2023-01-18 VITALS — BP 134/56 | HR 74 | Temp 97.6°F | Ht 63.0 in | Wt 151.8 lb

## 2023-01-18 DIAGNOSIS — D46Z Other myelodysplastic syndromes: Secondary | ICD-10-CM

## 2023-01-18 DIAGNOSIS — D461 Refractory anemia with ring sideroblasts: Secondary | ICD-10-CM | POA: Diagnosis not present

## 2023-01-18 LAB — CMP (CANCER CENTER ONLY)
ALT: 16 U/L (ref 0–44)
AST: 22 U/L (ref 15–41)
Albumin: 4.2 g/dL (ref 3.5–5.0)
Alkaline Phosphatase: 49 U/L (ref 38–126)
Anion gap: 8 (ref 5–15)
BUN: 20 mg/dL (ref 8–23)
CO2: 25 mmol/L (ref 22–32)
Calcium: 9.3 mg/dL (ref 8.9–10.3)
Chloride: 104 mmol/L (ref 98–111)
Creatinine: 1.09 mg/dL — ABNORMAL HIGH (ref 0.44–1.00)
GFR, Estimated: 54 mL/min — ABNORMAL LOW (ref >60)
Glucose, Bld: 150 mg/dL — ABNORMAL HIGH (ref 70–99)
Potassium: 4.3 mmol/L (ref 3.5–5.1)
Sodium: 137 mmol/L (ref 135–145)
Total Bilirubin: 0.5 mg/dL (ref 0.3–1.2)
Total Protein: 8.4 g/dL — ABNORMAL HIGH (ref 6.5–8.1)

## 2023-01-18 LAB — CBC WITH DIFFERENTIAL/PLATELET
Abs Immature Granulocytes: 0.81 10*3/uL — ABNORMAL HIGH (ref 0.00–0.07)
Basophils Absolute: 0.2 10*3/uL — ABNORMAL HIGH (ref 0.0–0.1)
Basophils Relative: 1 %
Eosinophils Absolute: 0.2 10*3/uL (ref 0.0–0.5)
Eosinophils Relative: 1 %
HCT: 33.3 % — ABNORMAL LOW (ref 36.0–46.0)
Hemoglobin: 10.2 g/dL — ABNORMAL LOW (ref 12.0–15.0)
Immature Granulocytes: 6 %
Lymphocytes Relative: 25 %
Lymphs Abs: 3.2 10*3/uL (ref 0.7–4.0)
MCH: 30.5 pg (ref 26.0–34.0)
MCHC: 30.6 g/dL (ref 30.0–36.0)
MCV: 99.7 fL (ref 80.0–100.0)
Monocytes Absolute: 1.7 10*3/uL — ABNORMAL HIGH (ref 0.1–1.0)
Monocytes Relative: 13 %
Neutro Abs: 6.9 10*3/uL (ref 1.7–7.7)
Neutrophils Relative %: 54 %
Platelets: 264 10*3/uL (ref 150–400)
RBC: 3.34 MIL/uL — ABNORMAL LOW (ref 3.87–5.11)
RDW: 31.5 % — ABNORMAL HIGH (ref 11.5–15.5)
Smear Review: NORMAL
WBC: 12.9 10*3/uL — ABNORMAL HIGH (ref 4.0–10.5)
nRBC: 1.2 % — ABNORMAL HIGH (ref 0.0–0.2)

## 2023-01-18 LAB — LACTATE DEHYDROGENASE: LDH: 150 U/L (ref 98–192)

## 2023-01-18 MED ORDER — LUSPATERCEPT-AAMT 75 MG ~~LOC~~ SOLR
1.0000 mg/kg | Freq: Once | SUBCUTANEOUS | Status: AC
  Administered 2023-01-18: 65 mg via SUBCUTANEOUS
  Filled 2023-01-18: qty 1.3

## 2023-01-18 NOTE — Progress Notes (Signed)
No concerns today 

## 2023-01-18 NOTE — Assessment & Plan Note (Addendum)
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 MARCH 2023-bone marrow biopsy no evidence of obvious progression of MDS or any acute process.  Currently on Aranesp 100 mcg every 2-3 week plus Luspatercept.   # proceed with Luspatercept # 13.. today  Continue with Aranesp prn for Hb < 10. Today- Hb 10.2-overall stable.  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   # vit D def- FEB 2024-Vitamin D- 25/low-  on Vit D 50-continue vitamin D weekly.  Will recheck in couple of months.  # CKD- stage III- 50s- monitor for now- stable  # Vaccination: s/p flu shot. Consider/recommend COVID/ RSV  # DISPOSITION:  # proceed with Luspatecept  Today;HOLD  Aranesp today # follow up in 3 weeks--MD;  labs CBC/cmp; LDH;  possible aranesp; Luspatercept  SQ-  Dr.B

## 2023-01-18 NOTE — Progress Notes (Signed)
[facility] CONSULT NOTE  Patient Care Team: [name], Audrie Gallus, MD as PCP - General Donneta Romberg, Worthy Flank, MD as Consulting Physician (Hematology and Oncology)  CHIEF COMPLAINTS/PURPOSE OF CONSULTATION: MDS   Oncology History Overview Note   # July 2020-myelodysplastic syndrome with ringed sideroblasts-Normal karyotype; no blasts [IPSS R-Very low risk ~median survival 8.3 years]; iron studies B12 folic acid myeloma panel normal; pyridoxine levels/copper/zinc-WNL. Erythropoietin levels-60. II OPINION at [facility] [Dr.DeCastro]  # [facility]/ NGS: TET2([id number])c.2524delT(p.Ser842GlnfsTer31) Exon 3 frame-shift SF3B1([id number])c.2098A>G(p.Lys700Glu) Exon 15 missense  DNMT3A(NM_[id number])c.2204A>G(p.Tyr735Cys) Exon 19 missense   # JAN 11th 2020- Aranesp/retacrit;  # July 30th, 2021- START REVLIMID 10 mg/day  [SF3B72mutation]  MARCH 2023- BONE MARROW, ASPIRATE, CLOT, CORE:  -  Hypercellular marrow involved by myelodysplastic syndrome with ring  sideroblasts (MDS-RS)  -  Polytypic plasmacytosis   # MARCh 2023-increase the frequency of Aranesp 400 mcg every 2 weeks; continue Revlimid.   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; September 2021 ejection fraction 60 to 65%;   # colonoscopy- 2016 [Dr.Bucinni];  DIAGNOSIS: MDS/low-grade with ringed sideroblasts  STAGE: Low       ;  GOALS: Control  CURRENT/MOST RECENT THERAPY : EPO agent+ REV    MDS (myelodysplastic syndrome), low grade  04/23/2019 Initial Diagnosis   MDS (myelodysplastic syndrome), low grade ([facility])   05/11/2022 - 05/11/2022 Chemotherapy   Patient is on Treatment Plan : MYELODYSPLASIA Luspatercept q21d     05/11/2022 -  Chemotherapy   Patient is on Treatment Plan : MYELODYSPLASIA Luspatercept q21d      HISTORY OF PRESENTING ILLNESS: Accompanied by husband ambulating independently.  Carla [name] 72 y.o.  female history of low-grade MDS anemia with  ring sideroblasts currently on Aranesp/Luspatercept is here for follow-up.    Patient continues to have chronic joint pains body aches.  However systemic lupus workup negative as per the patient.  Denies any worsening shortness of breath or cough.  Appetite is fairly good. Still taking Calcium+D with the prescription Vitamin D.   Review of Systems  Constitutional:  Positive for malaise/fatigue. Negative for chills, diaphoresis and fever.  HENT:  Negative for nosebleeds and sore throat.   Eyes:  Negative for double vision.  Respiratory:  Negative for cough, hemoptysis, sputum production, shortness of breath and wheezing.   Cardiovascular:  Negative for chest pain and orthopnea.  Gastrointestinal:  Negative for abdominal pain, blood in stool, constipation, diarrhea, heartburn, melena and vomiting.  Genitourinary:  Negative for dysuria, frequency and urgency.  Musculoskeletal:  Positive for back pain and joint pain.  Skin: Negative.  Negative for itching and rash.  Neurological:  Negative for dizziness, tingling, focal weakness, weakness and headaches.  Endo/Heme/Allergies:  Does not bruise/bleed easily.  Psychiatric/Behavioral:  Negative for depression. The patient is not nervous/anxious and does not have insomnia.     MEDICAL HISTORY:  Past Medical History:  Diagnosis Date   Allergic rhinitis, cause unspecified    Anemia, unspecified    Carpal tunnel syndrome    Cystitis, unspecified    Family history of osteoporosis    PONV (postoperative nausea and vomiting)    Postnasal drip    Syncope and collapse     SURGICAL HISTORY: Past Surgical History:  Procedure Laterality Date   BONE MARROW BIOPSY     CARPAL TUNNEL RELEASE     COLONOSCOPY  09/28/2003   COLONOSCOPY WITH PROPOFOL N/A 07/31/2015   Procedure: COLONOSCOPY WITH PROPOFOL;  Surgeon: Bernette Redbird, MD;  Location: [facility];  Service: Endoscopy;  Laterality: N/A;   DIAGNOSTIC LAPAROSCOPY     dx lack of pregnancy     EYE SURGERY     lasik, cataract, prk,yag procedure    SOCIAL HISTORY: Social History   Socioeconomic History   Marital status: Married    Spouse name: Not on file   Number of children: Not on file   Years of education: Not on file   Highest education level: Not on file  Occupational History   Not on file  Tobacco Use   Smoking status: Never   Smokeless tobacco: Never  Vaping Use   Vaping Use: Never used  Substance and Sexual Activity   Alcohol use: Yes    Alcohol/week: 0.0 standard drinks of alcohol    Comment: Rare   Drug use: No   Sexual activity: Not Currently  Other Topics Concern   Not on file  Social History Narrative   No regular exercise      Drinks lots of [employer]         Social Determinants of Health   Financial Resource Strain: Low Risk  (05/20/2022)   Overall Financial Resource Strain (CARDIA)    Difficulty of Paying Living Expenses: Not very hard  Food Insecurity: No Food Insecurity (05/20/2022)   Hunger Vital Sign    Worried About Running Out of Food in the Last Year: Never true    Ran Out of Food in the Last Year: Never true  Transportation Needs: No Transportation Needs (05/20/2022)   PRAPARE - Administrator, Civil Service (Medical): No    Lack of Transportation (Non-Medical): No  Physical Activity: Inactive (05/20/2022)   Exercise Vital Sign    Days of Exercise per Week: 0 days    Minutes of Exercise per Session: 0 min  Stress: No Stress Concern Present (05/20/2022)   [employer] of Occupational Health - Occupational Stress Questionnaire    Feeling of Stress : Only a little  Social Connections: Moderately Integrated (05/20/2022)   Social Connection and Isolation Panel [NHANES]    Frequency of Communication with Friends and Family: More than three times a week    Frequency of Social Gatherings with Friends and Family: Once a week    Attends Religious Services: More than 4 times per year    Active Member of [employer] or Organizations:  No    Attends Banker Meetings: Never    Marital Status: Married  Catering manager Violence: Not At Risk (05/20/2022)   Humiliation, Afraid, Rape, and Kick questionnaire    Fear of Current or Ex-Partner: No    Emotionally Abused: No    Physically Abused: No    Sexually Abused: No    FAMILY HISTORY: Family History  Problem Relation Age of Onset   Osteoporosis Mother    Stroke Mother    Coronary artery disease Father    Stroke Father 50   Diabetes Other        Aunts and uncles   Breast cancer Paternal Aunt    Breast cancer Maternal Aunt    Arthritis/Rheumatoid Child    Breast cancer Cousin     ALLERGIES:  is allergic to codeine.  MEDICATIONS:  Current Outpatient Medications  Medication Sig Dispense Refill   acetaminophen (TYLENOL) 500 MG tablet Take 500-1,000 mg by mouth every 6 (six) hours as needed for mild pain or headache.     aspirin EC 81 MG tablet Take 81 mg by mouth at bedtime. Swallow whole.  Biotin 10 MG CAPS Take by mouth.     calcium-vitamin D (OSCAL WITH D) 500-5 MG-MCG tablet Take 1 tablet by mouth daily with breakfast.     clobetasol (TEMOVATE) 0.05 % external solution Apply topically.     Darbepoetin Alfa (ARANESP) 500 MCG/ML SOSY injection Inject 500 mcg into the skin See admin instructions. Inject 500 mcg into the skin every week     desonide (DESOWEN) 0.05 % cream Apply topically.     ergocalciferol (VITAMIN D2) 1.25 MG (50000 UT) capsule Take 1 capsule (50,000 Units total) by mouth once a week. 12 capsule 1   levothyroxine (SYNTHROID) 25 MCG tablet TAKE 1 TABLET BY MOUTH ONCE A DAY BEFOREBREAKFAST. 90 tablet 3   ondansetron (ZOFRAN ODT) 8 MG disintegrating tablet Take 1 tablet (8 mg total) by mouth every 8 (eight) hours as needed for nausea or vomiting. 30 tablet 0   No current facility-administered medications for this visit.      PHYSICAL EXAMINATION:   Vitals:   01/18/23 1455  BP: (!) 134/56  Pulse: 74  Temp: 97.6 F (36.4 C)   SpO2: 96%     Filed Weights   01/18/23 1455  Weight: 151 lb 12.8 oz (68.9 kg)      Physical Exam HENT:     Head: Normocephalic and atraumatic.     Mouth/Throat:     Pharynx: No oropharyngeal exudate.  Eyes:     Pupils: Pupils are equal, round, and reactive to light.  Cardiovascular:     Rate and Rhythm: Normal rate and regular rhythm.     Pulses: Normal pulses.     Heart sounds: Normal heart sounds.  Pulmonary:     Effort: Pulmonary effort is normal. No respiratory distress.     Breath sounds: Normal breath sounds. No wheezing.  Abdominal:     General: Bowel sounds are normal. There is no distension.     Palpations: Abdomen is soft. There is no mass.     Tenderness: There is no abdominal tenderness. There is no guarding or rebound.  Musculoskeletal:        General: No tenderness. Normal range of motion.     Cervical back: Normal range of motion and neck supple.  Skin:    General: Skin is warm.  Neurological:     Mental Status: She is alert and oriented to person, place, and time.  Psychiatric:        Mood and Affect: Affect normal.     LABORATORY DATA:  I have reviewed the data as listed Lab Results  Component Value Date   WBC 12.9 (H) 01/18/2023   HGB 10.2 (L) 01/18/2023   HCT 33.3 (L) 01/18/2023   MCV 99.7 01/18/2023   PLT 264 01/18/2023   Recent Labs    12/07/22 0930 12/28/22 1457 01/18/23 1443  NA 137 135 137  K 4.0 3.8 4.3  CL 103 103 104  CO2 GLUCOSE 118* 126* 150*  BUN CREATININE 1.09* 1.07* 1.09*  CALCIUM 9.4 9.1 9.3  GFRNONAA 54* 55* 54*  PROT 8.5* 8.2* 8.4*  ALBUMIN 4.4 4.2 4.2  AST ALT ALKPHOS 43 44 49  BILITOT 1.2 0.7 0.5     No results found.  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 POSITIVE  For SF3B1; R-IPSS-low risk. MARCH 2023-bone marrow biopsy no evidence of obvious progression of MDS  or any acute process.  Currently  on Aranesp 100 mcg every 2-3 week plus Luspatercept.   # proceed with Luspatercept # 13.. today  Continue with Aranesp prn for Hb < 10. Today- Hb 10.2-overall stable.  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   # vit D def- FEB 2024-Vitamin D- 25/low-  on Vit D 50-continue vitamin D weekly.  Will recheck in couple of months.  # CKD- stage III- 50s- monitor for now- stable  # Vaccination: s/p flu shot. Consider/recommend COVID/ RSV  # DISPOSITION:  # proceed with Luspatecept  Today;HOLD  Aranesp today # follow up in 3 weeks--MD;  labs CBC/cmp; LDH;  possible aranesp; Luspatercept  SQ-  Dr.B  All questions were answered. The patient knows to call the clinic with any problems, questions or concerns.   Earna Coder, MD 01/18/2023 4:04 PM

## 2023-01-27 ENCOUNTER — Other Ambulatory Visit: Payer: Self-pay | Admitting: Internal Medicine

## 2023-02-08 ENCOUNTER — Telehealth: Payer: Self-pay | Admitting: *Deleted

## 2023-02-08 ENCOUNTER — Inpatient Hospital Stay (HOSPITAL_BASED_OUTPATIENT_CLINIC_OR_DEPARTMENT_OTHER): Payer: PPO | Admitting: Internal Medicine

## 2023-02-08 ENCOUNTER — Inpatient Hospital Stay: Payer: PPO | Attending: Internal Medicine

## 2023-02-08 ENCOUNTER — Encounter: Payer: Self-pay | Admitting: Internal Medicine

## 2023-02-08 ENCOUNTER — Inpatient Hospital Stay: Payer: PPO

## 2023-02-08 VITALS — BP 138/93 | HR 66 | Temp 96.5°F | Ht 63.0 in | Wt 152.4 lb

## 2023-02-08 DIAGNOSIS — N183 Chronic kidney disease, stage 3 unspecified: Secondary | ICD-10-CM | POA: Insufficient documentation

## 2023-02-08 DIAGNOSIS — M7918 Myalgia, other site: Secondary | ICD-10-CM | POA: Diagnosis not present

## 2023-02-08 DIAGNOSIS — E559 Vitamin D deficiency, unspecified: Secondary | ICD-10-CM | POA: Insufficient documentation

## 2023-02-08 DIAGNOSIS — D46Z Other myelodysplastic syndromes: Secondary | ICD-10-CM | POA: Diagnosis not present

## 2023-02-08 DIAGNOSIS — D461 Refractory anemia with ring sideroblasts: Secondary | ICD-10-CM | POA: Insufficient documentation

## 2023-02-08 LAB — CMP (CANCER CENTER ONLY)
ALT: 14 U/L (ref 0–44)
AST: 23 U/L (ref 15–41)
Albumin: 4.3 g/dL (ref 3.5–5.0)
Alkaline Phosphatase: 45 U/L (ref 38–126)
Anion gap: 10 (ref 5–15)
BUN: 19 mg/dL (ref 8–23)
CO2: 24 mmol/L (ref 22–32)
Calcium: 9.1 mg/dL (ref 8.9–10.3)
Chloride: 104 mmol/L (ref 98–111)
Creatinine: 1.01 mg/dL — ABNORMAL HIGH (ref 0.44–1.00)
GFR, Estimated: 59 mL/min — ABNORMAL LOW (ref >60)
Glucose, Bld: 140 mg/dL — ABNORMAL HIGH (ref 70–99)
Potassium: 4.1 mmol/L (ref 3.5–5.1)
Sodium: 138 mmol/L (ref 135–145)
Total Bilirubin: 0.8 mg/dL (ref 0.3–1.2)
Total Protein: 8.2 g/dL — ABNORMAL HIGH (ref 6.5–8.1)

## 2023-02-08 LAB — CBC WITH DIFFERENTIAL (CANCER CENTER ONLY)
Abs Immature Granulocytes: 0.64 10*3/uL — ABNORMAL HIGH (ref 0.00–0.07)
Basophils Absolute: 0.1 10*3/uL (ref 0.0–0.1)
Basophils Relative: 1 %
Eosinophils Absolute: 0.1 10*3/uL (ref 0.0–0.5)
Eosinophils Relative: 1 %
HCT: 33.1 % — ABNORMAL LOW (ref 36.0–46.0)
Hemoglobin: 10.3 g/dL — ABNORMAL LOW (ref 12.0–15.0)
Immature Granulocytes: 6 %
Lymphocytes Relative: 23 %
Lymphs Abs: 2.5 10*3/uL (ref 0.7–4.0)
MCH: 30.6 pg (ref 26.0–34.0)
MCHC: 31.1 g/dL (ref 30.0–36.0)
MCV: 98.2 fL (ref 80.0–100.0)
Monocytes Absolute: 1.6 10*3/uL — ABNORMAL HIGH (ref 0.1–1.0)
Monocytes Relative: 14 %
Neutro Abs: 6.2 10*3/uL (ref 1.7–7.7)
Neutrophils Relative %: 55 %
Platelet Count: 227 10*3/uL (ref 150–400)
RBC: 3.37 MIL/uL — ABNORMAL LOW (ref 3.87–5.11)
RDW: 30.1 % — ABNORMAL HIGH (ref 11.5–15.5)
Smear Review: NORMAL
WBC Count: 11.2 10*3/uL — ABNORMAL HIGH (ref 4.0–10.5)
nRBC: 0.8 % — ABNORMAL HIGH (ref 0.0–0.2)

## 2023-02-08 LAB — LACTATE DEHYDROGENASE: LDH: 135 U/L (ref 98–192)

## 2023-02-08 MED ORDER — LUSPATERCEPT-AAMT 75 MG ~~LOC~~ SOLR
1.0000 mg/kg | Freq: Once | SUBCUTANEOUS | Status: DC
  Filled 2023-02-08: qty 1.3

## 2023-02-08 MED ORDER — LUSPATERCEPT-AAMT 75 MG ~~LOC~~ SOLR
1.0000 mg/kg | Freq: Once | SUBCUTANEOUS | Status: AC
  Administered 2023-02-08: 70 mg via SUBCUTANEOUS
  Filled 2023-02-08: qty 1.4

## 2023-02-08 NOTE — Progress Notes (Signed)
[facility] CONSULT NOTE  Patient Care Team: [name], Audrie Gallus, MD as PCP - General Donneta Romberg, Worthy Flank, MD as Consulting Physician (Hematology and Oncology)  CHIEF COMPLAINTS/PURPOSE OF CONSULTATION: MDS   Oncology History Overview Note   # July 2020-myelodysplastic syndrome with ringed sideroblasts-Normal karyotype; no blasts [IPSS R-Very low risk ~median survival 8.3 years]; iron studies B12 folic acid myeloma panel normal; pyridoxine levels/copper/zinc-WNL. Erythropoietin levels-60. II OPINION at [facility] [Dr.DeCastro]  # [facility]/ NGS: TET2([id number])c.2524delT(p.Ser842GlnfsTer31) Exon 3 frame-shift SF3B1([id number])c.2098A>G(p.Lys700Glu) Exon 15 missense  DNMT3A(NM_[id number])c.2204A>G(p.Tyr735Cys) Exon 19 missense   # JAN 11th 2020- Aranesp/retacrit;  # July 30th, 2021- START REVLIMID 10 mg/day  [SF3B55mutation]  MARCH 2023- BONE MARROW, ASPIRATE, CLOT, CORE:  -  Hypercellular marrow involved by myelodysplastic syndrome with ring  sideroblasts (MDS-RS)  -  Polytypic plasmacytosis   # [facility] 2023-increase the frequency of Aranesp 400 mcg every 2 weeks; continue Revlimid.   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; September 2021 ejection fraction 60 to 65%;   # colonoscopy- 2016 [Dr.Bucinni];  DIAGNOSIS: MDS/low-grade with ringed sideroblasts  STAGE: Low       ;  GOALS: Control  CURRENT/MOST RECENT THERAPY : EPO agent+ REV    MDS (myelodysplastic syndrome), low grade (HCC)  04/23/2019 Initial Diagnosis   MDS (myelodysplastic syndrome), low grade (HCC)   05/11/2022 - 05/11/2022 Chemotherapy   Patient is on Treatment Plan : MYELODYSPLASIA Luspatercept q21d     05/11/2022 -  Chemotherapy   Patient is on Treatment Plan : MYELODYSPLASIA Luspatercept q21d      HISTORY OF PRESENTING ILLNESS: Accompanied by husband ambulating independently.  Carla [name] 72 y.o.  female history of low-grade MDS anemia  with ring sideroblasts currently on Aranesp/Luspatercept is here for follow-up.    C/o generalized joint pain x2 months getter worse.   Patient continues to have chronic joint pains body aches.  Denies any worsening shortness of breath or cough.  Appetite is fairly good. Still taking Calcium+D with the prescription Vitamin D.   Review of Systems  Constitutional:  Positive for malaise/fatigue. Negative for chills, diaphoresis and fever.  HENT:  Negative for nosebleeds and sore throat.   Eyes:  Negative for double vision.  Respiratory:  Negative for cough, hemoptysis, sputum production, shortness of breath and wheezing.   Cardiovascular:  Negative for chest pain and orthopnea.  Gastrointestinal:  Negative for abdominal pain, blood in stool, constipation, diarrhea, heartburn, melena and vomiting.  Genitourinary:  Negative for dysuria, frequency and urgency.  Musculoskeletal:  Positive for back pain and joint pain.  Skin: Negative.  Negative for itching and rash.  Neurological:  Negative for dizziness, tingling, focal weakness, weakness and headaches.  Endo/Heme/Allergies:  Does not bruise/bleed easily.  Psychiatric/Behavioral:  Negative for depression. The patient is not nervous/anxious and does not have insomnia.     MEDICAL HISTORY:  Past Medical History:  Diagnosis Date   Allergic rhinitis, cause unspecified    Anemia, unspecified    Carpal tunnel syndrome    Cystitis, unspecified    Family history of osteoporosis    PONV (postoperative nausea and vomiting)    Postnasal drip    Syncope and collapse     SURGICAL HISTORY: Past Surgical History:  Procedure Laterality Date   BONE MARROW BIOPSY     CARPAL TUNNEL RELEASE     COLONOSCOPY  09/28/2003   COLONOSCOPY WITH PROPOFOL N/A 07/31/2015   Procedure: COLONOSCOPY WITH PROPOFOL;  Surgeon: Bernette Redbird, MD;  Location: [facility];  Service: Endoscopy;  Laterality: N/A;   DIAGNOSTIC LAPAROSCOPY     dx lack of pregnancy     EYE SURGERY     lasik, cataract, prk,yag procedure    SOCIAL HISTORY: Social History   Socioeconomic History   Marital status: Married    Spouse name: Not on file   Number of children: Not on file   Years of education: Not on file   Highest education level: Not on file  Occupational History   Not on file  Tobacco Use   Smoking status: Never   Smokeless tobacco: Never  Vaping Use   Vaping Use: Never used  Substance and Sexual Activity   Alcohol use: Yes    Alcohol/week: 0.0 standard drinks of alcohol    Comment: Rare   Drug use: No   Sexual activity: Not Currently  Other Topics Concern   Not on file  Social History Narrative   No regular exercise      Drinks lots of [employer]eps[employer]         Social Determinants of Health   Financial Resource Strain: Low Risk  (05/20/2022)   Overall Financial Resource Strain (CARDIA)    Difficulty of Paying Living Expenses: Not very hard  Food Insecurity: No Food Insecurity (05/20/2022)   Hunger Vital Sign    Worried About Running Out of Food in the Last Year: Never true    Ran Out of Food in the Last Year: Never true  Transportation Needs: No Transportation Needs (05/20/2022)   PRAPARE - Administrator, Civil Service (Medical): No    Lack of Transportation (Non-Medical): No  Physical Activity: Inactive (05/20/2022)   Exercise Vital Sign    Days of Exercise per Week: 0 days    Minutes of Exercise per Session: 0 min  Stress: No Stress Concern Present (05/20/2022)   [employer] of Occupational Health - Occupational Stress Questionnaire    Feeling of Stress : Only a little  Social Connections: Moderately Integrated (05/20/2022)   Social Connection and Isolation Panel [NHANES]    Frequency of Communication with Friends and Family: More than three times a week    Frequency of Social Gatherings with Friends and Family: Once a week    Attends Religious Services: More than 4 times per year    Active Member of [employer] or Organizations:  No    Attends Banker Meetings: Never    Marital Status: Married  Catering manager Violence: Not At Risk (05/20/2022)   Humiliation, Afraid, Rape, and Kick questionnaire    Fear of Current or Ex-Partner: No    Emotionally Abused: No    Physically Abused: No    Sexually Abused: No    FAMILY HISTORY: Family History  Problem Relation Age of Onset   Osteoporosis Mother    Stroke Mother    Coronary artery disease Father    Stroke Father 50   Diabetes Other        Aunts and uncles   Breast cancer Paternal Aunt    Breast cancer Maternal Aunt    Arthritis/Rheumatoid Child    Breast cancer Cousin     ALLERGIES:  is allergic to codeine.  MEDICATIONS:  Current Outpatient Medications  Medication Sig Dispense Refill   acetaminophen (TYLENOL) 500 MG tablet Take 500-1,000 mg by mouth every 6 (six) hours as needed for mild pain or headache.     aspirin EC 81 MG tablet Take 81 mg by mouth at bedtime. Swallow whole.  Biotin 10 MG CAPS Take by mouth.     calcium-vitamin D (OSCAL WITH D) 500-5 MG-MCG tablet Take 1 tablet by mouth daily with breakfast.     clobetasol (TEMOVATE) 0.05 % external solution Apply topically.     Darbepoetin Alfa (ARANESP) 500 MCG/ML SOSY injection Inject 500 mcg into the skin See admin instructions. Inject 500 mcg into the skin every week     desonide (DESOWEN) 0.05 % cream Apply topically.     levothyroxine (SYNTHROID) 25 MCG tablet TAKE 1 TABLET BY MOUTH ONCE A DAY BEFOREBREAKFAST. 90 tablet 3   ondansetron (ZOFRAN ODT) 8 MG disintegrating tablet Take 1 tablet (8 mg total) by mouth every 8 (eight) hours as needed for nausea or vomiting. 30 tablet 0   Vitamin D, Ergocalciferol, (DRISDOL) 1.25 MG (50000 UNIT) CAPS capsule TAKE ONE CAPSULE BY MOUTH ONCE A WEEK 12 capsule 1   No current facility-administered medications for this visit.      PHYSICAL EXAMINATION:   Vitals:   02/08/23 0936  BP: (!) 138/93  Pulse: 66  Temp: (!) 96.5 F (35.8 C)   SpO2: 97%     Filed Weights   02/08/23 0936  Weight: 152 lb 6.4 oz (69.1 kg)      Physical Exam HENT:     Head: Normocephalic and atraumatic.     Mouth/Throat:     Pharynx: No oropharyngeal exudate.  Eyes:     Pupils: Pupils are equal, round, and reactive to light.  Cardiovascular:     Rate and Rhythm: Normal rate and regular rhythm.     Pulses: Normal pulses.     Heart sounds: Normal heart sounds.  Pulmonary:     Effort: Pulmonary effort is normal. No respiratory distress.     Breath sounds: Normal breath sounds. No wheezing.  Abdominal:     General: Bowel sounds are normal. There is no distension.     Palpations: Abdomen is soft. There is no mass.     Tenderness: There is no abdominal tenderness. There is no guarding or rebound.  Musculoskeletal:        General: No tenderness. Normal range of motion.     Cervical back: Normal range of motion and neck supple.  Skin:    General: Skin is warm.  Neurological:     Mental Status: She is alert and oriented to person, place, and time.  Psychiatric:        Mood and Affect: Affect normal.     LABORATORY DATA:  I have reviewed the data as listed Lab Results  Component Value Date   WBC 11.2 (H) 02/08/2023   HGB 10.3 (L) 02/08/2023   HCT 33.1 (L) 02/08/2023   MCV 98.2 02/08/2023   PLT 227 02/08/2023   Recent Labs    12/28/22 1457 01/18/23 1443 02/08/23 0927  NA 135 137 138  K 3.8 4.3 4.1  CL 103 104 104  CO2 23 25 24   GLUCOSE 126* 150* 140*  BUN 22 20 19   CREATININE 1.07* 1.09* 1.01*  CALCIUM 9.1 9.3 9.1  GFRNONAA 55* 54* 59*  PROT 8.2* 8.4* 8.2*  ALBUMIN 4.2 4.2 4.3  AST 20 22 23   ALT 14 16 14   ALKPHOS 44 49 45  BILITOT 0.7 0.5 0.8     No results found.  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 POSITIVE  For SF3B1; R-IPSS-low risk. MARCH 2023-bone marrow biopsy no evidence of obvious progression of MDS  or any acute process.  Currently on  Aranesp 100 mcg every 2-3 week plus Luspatercept.   # proceed with Luspatercept # 14 today  Continue with Aranesp prn for Hb < 10. Today- Hb 10.3-overall stable.  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 Worse. Referral to [facility] rheumatology re: joint pains   # vit D def- FEB 2024-Vitamin D- 25/low-  on Vit D 50-continue vitamin D weekly.  Will recheck next visit. .  # CKD- stage III- 50s- monitor for now- stable.  # Vaccination: s/p flu shot. Consider/recommend COVID/ RSV-   # DISPOSITION:  # referral to [facility] rheumatology re: joint pains  # proceed with Luspatecept Today; HOLD  Aranesp today # follow up in 3 weeks--MD;  labs CBC/cmp; vit D 25-OH levels;  LDH;  possible aranesp; Luspatercept  SQ-  Dr.B  All questions were answered. The patient knows to call the clinic with any problems, questions or concerns.   Earna Coder, MD 02/08/2023 11:04 AM

## 2023-02-08 NOTE — Assessment & Plan Note (Addendum)
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 MARCH 2023-bone marrow biopsy no evidence of obvious progression of MDS or any acute process.  Currently on Aranesp 100 mcg every 2-3 week plus Luspatercept.   # proceed with Luspatercept # 14 today  Continue with Aranesp prn for Hb < 10. Today- Hb 10.3-overall stable.  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 Worse. Referral to [facility] rheumatology re: joint pains   # vit D def- FEB 2024-Vitamin D- 25/low-  on Vit D 50-continue vitamin D weekly.  Will recheck next visit. .  # CKD- stage III- 50s- monitor for now- stable.  # Vaccination: s/p flu shot. Consider/recommend COVID/ RSV-   # DISPOSITION:  # referral to [facility] rheumatology re: joint pains  # proceed with Luspatecept Today; HOLD  Aranesp today # follow up in 3 weeks--MD;  labs CBC/cmp; vit D 25-OH levels;  LDH;  possible aranesp; Luspatercept  SQ-  Dr.B

## 2023-02-08 NOTE — Telephone Encounter (Signed)
Referral placed in epic to Rheumatology at [facility] for Joint pains. Referral info also faxed to their office.

## 2023-02-08 NOTE — Progress Notes (Signed)
C/o generalized joint pain x2 months getter worse.

## 2023-03-01 ENCOUNTER — Encounter: Payer: Self-pay | Admitting: Internal Medicine

## 2023-03-01 ENCOUNTER — Inpatient Hospital Stay (HOSPITAL_BASED_OUTPATIENT_CLINIC_OR_DEPARTMENT_OTHER): Payer: PPO | Admitting: Internal Medicine

## 2023-03-01 ENCOUNTER — Inpatient Hospital Stay: Payer: PPO | Attending: Internal Medicine

## 2023-03-01 ENCOUNTER — Inpatient Hospital Stay: Payer: PPO

## 2023-03-01 VITALS — BP 108/68 | HR 75 | Temp 97.0°F | Ht 63.0 in | Wt 152.0 lb

## 2023-03-01 DIAGNOSIS — D46Z Other myelodysplastic syndromes: Secondary | ICD-10-CM

## 2023-03-01 DIAGNOSIS — D461 Refractory anemia with ring sideroblasts: Secondary | ICD-10-CM | POA: Diagnosis not present

## 2023-03-01 DIAGNOSIS — N183 Chronic kidney disease, stage 3 unspecified: Secondary | ICD-10-CM | POA: Insufficient documentation

## 2023-03-01 DIAGNOSIS — E039 Hypothyroidism, unspecified: Secondary | ICD-10-CM | POA: Diagnosis not present

## 2023-03-01 DIAGNOSIS — E559 Vitamin D deficiency, unspecified: Secondary | ICD-10-CM | POA: Insufficient documentation

## 2023-03-01 LAB — CBC WITH DIFFERENTIAL (CANCER CENTER ONLY)
Abs Immature Granulocytes: 0.5 10*3/uL — ABNORMAL HIGH (ref 0.00–0.07)
Basophils Absolute: 0.1 10*3/uL (ref 0.0–0.1)
Basophils Relative: 1 %
Eosinophils Absolute: 0.1 10*3/uL (ref 0.0–0.5)
Eosinophils Relative: 1 %
HCT: 33.2 % — ABNORMAL LOW (ref 36.0–46.0)
Hemoglobin: 10.5 g/dL — ABNORMAL LOW (ref 12.0–15.0)
Immature Granulocytes: 5 %
Lymphocytes Relative: 19 %
Lymphs Abs: 2 10*3/uL (ref 0.7–4.0)
MCH: 30.3 pg (ref 26.0–34.0)
MCHC: 31.6 g/dL (ref 30.0–36.0)
MCV: 96 fL (ref 80.0–100.0)
Monocytes Absolute: 1.3 10*3/uL — ABNORMAL HIGH (ref 0.1–1.0)
Monocytes Relative: 12 %
Neutro Abs: 6.6 10*3/uL (ref 1.7–7.7)
Neutrophils Relative %: 62 %
Platelet Count: 260 10*3/uL (ref 150–400)
RBC: 3.46 MIL/uL — ABNORMAL LOW (ref 3.87–5.11)
RDW: 30.3 % — ABNORMAL HIGH (ref 11.5–15.5)
Smear Review: NORMAL
WBC Count: 10.6 10*3/uL — ABNORMAL HIGH (ref 4.0–10.5)
nRBC: 1.2 % — ABNORMAL HIGH (ref 0.0–0.2)

## 2023-03-01 LAB — CMP (CANCER CENTER ONLY)
ALT: 30 U/L (ref 0–44)
AST: 24 U/L (ref 15–41)
Albumin: 4.3 g/dL (ref 3.5–5.0)
Alkaline Phosphatase: 48 U/L (ref 38–126)
Anion gap: 10 (ref 5–15)
BUN: 20 mg/dL (ref 8–23)
CO2: 23 mmol/L (ref 22–32)
Calcium: 9.1 mg/dL (ref 8.9–10.3)
Chloride: 105 mmol/L (ref 98–111)
Creatinine: 1.07 mg/dL — ABNORMAL HIGH (ref 0.44–1.00)
GFR, Estimated: 55 mL/min — ABNORMAL LOW (ref >60)
Glucose, Bld: 144 mg/dL — ABNORMAL HIGH (ref 70–99)
Potassium: 4.1 mmol/L (ref 3.5–5.1)
Sodium: 138 mmol/L (ref 135–145)
Total Bilirubin: 0.9 mg/dL (ref 0.3–1.2)
Total Protein: 8.3 g/dL — ABNORMAL HIGH (ref 6.5–8.1)

## 2023-03-01 LAB — LACTATE DEHYDROGENASE: LDH: 140 U/L (ref 98–192)

## 2023-03-01 LAB — VITAMIN D 25 HYDROXY (VIT D DEFICIENCY, FRACTURES): Vit D, 25-Hydroxy: 41.06 ng/mL (ref 30–100)

## 2023-03-01 MED ORDER — LUSPATERCEPT-AAMT 75 MG ~~LOC~~ SOLR
1.0000 mg/kg | Freq: Once | SUBCUTANEOUS | Status: AC
  Administered 2023-03-01: 65 mg via SUBCUTANEOUS
  Filled 2023-03-01: qty 1.3

## 2023-03-01 NOTE — Assessment & Plan Note (Addendum)
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 MARCH 2023-bone marrow biopsy no evidence of obvious progression of MDS or any acute process.  Currently on Aranesp 100 mcg every 2-3 week plus Luspatercept.   # proceed with Luspatercept # 15 today  Continue with Aranesp prn for Hb < 10. Today- Hb 10.5 -overall stable.  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 mprovement with claritin. urrently awaiting  appt with [facility] rheumatology re: joint pains in 2 weeks.   # vit D def- FEB 2024-Vitamin D- 25/low-  on Vit D 50-continue vitamin D weekly.  Vit D pending today.   # CKD- stage III- 50s- monitor for now- stable.  # Vaccination: s/p flu shot. Consider/recommend COVID/ RSV-   # DISPOSITION:  # proceed with Luspatecept Today; HOLD  Aranesp today # follow up in 3 weeks--MD;  labs CBC/cmp;  LDH;  possible aranesp; Luspatercept  SQ-  Dr.B

## 2023-03-01 NOTE — Progress Notes (Signed)
No concerns today 

## 2023-03-01 NOTE — Progress Notes (Signed)
[facility] CONSULT NOTE  Patient Care Team: [name], Audrie Gallus, MD as PCP - General Donneta Romberg, Worthy Flank, MD as Consulting Physician (Hematology and Oncology)  CHIEF COMPLAINTS/PURPOSE OF CONSULTATION: MDS   Oncology History Overview Note   # July 2020-myelodysplastic syndrome with ringed sideroblasts-Normal karyotype; no blasts [IPSS R-Very low risk ~median survival 8.3 years]; iron studies B12 folic acid myeloma panel normal; pyridoxine levels/copper/zinc-WNL. Erythropoietin levels-60. II OPINION at [facility] [Dr.DeCastro]  # [facility]/ NGS: TET2([id number])c.2524delT(p.Ser842GlnfsTer31) Exon 3 frame-shift SF3B1([id number])c.2098A>G(p.Lys700Glu) Exon 15 missense  DNMT3A([id number])c.2204A>G(p.Tyr735Cys) Exon 19 missense   # JAN 11th 2020- Aranesp/retacrit;  # July 30th, 2021- START REVLIMID 10 mg/day  [SF3B100mutation]  MARCH 2023- BONE MARROW, ASPIRATE, CLOT, CORE:  -  Hypercellular marrow involved by myelodysplastic syndrome with ring  sideroblasts (MDS-RS)  -  Polytypic plasmacytosis   # [facility] 2023-increase the frequency of Aranesp 400 mcg every 2 weeks; continue Revlimid.   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; September 2021 ejection fraction 60 to 65%;   # colonoscopy- 2016 [Dr.Bucinni];  DIAGNOSIS: MDS/low-grade with ringed sideroblasts  STAGE: Low       ;  GOALS: Control  CURRENT/MOST RECENT THERAPY : EPO agent+ REV    MDS (myelodysplastic syndrome), low grade (HCC)  04/23/2019 Initial Diagnosis   MDS (myelodysplastic syndrome), low grade (HCC)   05/11/2022 - 05/11/2022 Chemotherapy   Patient is on Treatment Plan : MYELODYSPLASIA Luspatercept q21d     05/11/2022 -  Chemotherapy   Patient is on Treatment Plan : MYELODYSPLASIA Luspatercept q21d      HISTORY OF PRESENTING ILLNESS: Accompanied by husband ambulating independently.  Carla [name] 71 y.o.  female history of low-grade MDS anemia  with ring sideroblasts currently on Aranesp/Luspatercept is here for follow-up.    Patient denies any worsening shortness of breath or cough.  Denies any leg swelling.  C/o generalized joint pain x2 months getter worse.  However slightly improved on Claritin.  Patient currently awaiting appointment with rheumatology at [facility] for Joint pains.  Appetite is fairly good. Still taking Calcium+D with the prescription Vitamin D.   Review of Systems  Constitutional:  Positive for malaise/fatigue. Negative for chills, diaphoresis and fever.  HENT:  Negative for nosebleeds and sore throat.   Eyes:  Negative for double vision.  Respiratory:  Negative for cough, hemoptysis, sputum production, shortness of breath and wheezing.   Cardiovascular:  Negative for chest pain and orthopnea.  Gastrointestinal:  Negative for abdominal pain, blood in stool, constipation, diarrhea, heartburn, melena and vomiting.  Genitourinary:  Negative for dysuria, frequency and urgency.  Musculoskeletal:  Positive for back pain and joint pain.  Skin: Negative.  Negative for itching and rash.  Neurological:  Negative for dizziness, tingling, focal weakness, weakness and headaches.  Endo/Heme/Allergies:  Does not bruise/bleed easily.  Psychiatric/Behavioral:  Negative for depression. The patient is not nervous/anxious and does not have insomnia.     MEDICAL HISTORY:  Past Medical History:  Diagnosis Date   Allergic rhinitis, cause unspecified    Anemia, unspecified    Carpal tunnel syndrome    Cystitis, unspecified    Family history of osteoporosis    PONV (postoperative nausea and vomiting)    Postnasal drip    Syncope and collapse     SURGICAL HISTORY: Past Surgical History:  Procedure Laterality Date   BONE MARROW BIOPSY     CARPAL TUNNEL RELEASE     COLONOSCOPY  09/28/2003   COLONOSCOPY WITH PROPOFOL  N/A 07/31/2015   Procedure: COLONOSCOPY WITH PROPOFOL;  Surgeon: Bernette Redbird, MD;  Location: [facility];   Service: Endoscopy;  Laterality: N/A;   DIAGNOSTIC LAPAROSCOPY     dx lack of pregnancy    EYE SURGERY     lasik, cataract, prk,yag procedure    SOCIAL HISTORY: Social History   Socioeconomic History   Marital status: Married    Spouse name: Not on file   Number of children: Not on file   Years of education: Not on file   Highest education level: Not on file  Occupational History   Not on file  Tobacco Use   Smoking status: Never   Smokeless tobacco: Never  Vaping Use   Vaping Use: Never used  Substance and Sexual Activity   Alcohol use: Yes    Alcohol/week: 0.0 standard drinks of alcohol    Comment: Rare   Drug use: No   Sexual activity: Not Currently  Other Topics Concern   Not on file  Social History Narrative   No regular exercise      Drinks lots of [employer]         Social Determinants of Health   Financial Resource Strain: Low Risk  (05/20/2022)   Overall Financial Resource Strain (CARDIA)    Difficulty of Paying Living Expenses: Not very hard  Food Insecurity: No Food Insecurity (05/20/2022)   Hunger Vital Sign    Worried About Running Out of Food in the Last Year: Never true    Ran Out of Food in the Last Year: Never true  Transportation Needs: No Transportation Needs (05/20/2022)   PRAPARE - Administrator, Civil Service (Medical): No    Lack of Transportation (Non-Medical): No  Physical Activity: Inactive (05/20/2022)   Exercise Vital Sign    Days of Exercise per Week: 0 days    Minutes of Exercise per Session: 0 min  Stress: No Stress Concern Present (05/20/2022)   [employer] of Occupational Health - Occupational Stress Questionnaire    Feeling of Stress : Only a little  Social Connections: Moderately Integrated (05/20/2022)   Social Connection and Isolation Panel [NHANES]    Frequency of Communication with Friends and Family: More than three times a week    Frequency of Social Gatherings with Friends and Family: Once a week    Attends  Religious Services: More than 4 times per year    Active Member of [employer] or Organizations: No    Attends Banker Meetings: Never    Marital Status: Married  Catering manager Violence: Not At Risk (05/20/2022)   Humiliation, Afraid, Rape, and Kick questionnaire    Fear of Current or Ex-Partner: No    Emotionally Abused: No    Physically Abused: No    Sexually Abused: No    FAMILY HISTORY: Family History  Problem Relation Age of Onset   Osteoporosis Mother    Stroke Mother    Coronary artery disease Father    Stroke Father 36   Diabetes Other        Aunts and uncles   Breast cancer Paternal Aunt    Breast cancer Maternal Aunt    Arthritis/Rheumatoid Child    Breast cancer Cousin     ALLERGIES:  is allergic to codeine.  MEDICATIONS:  Current Outpatient Medications  Medication Sig Dispense Refill   acetaminophen (TYLENOL) 500 MG tablet Take 500-1,000 mg by mouth every 6 (six) hours as needed for mild pain or headache.  aspirin EC 81 MG tablet Take 81 mg by mouth at bedtime. Swallow whole.      Biotin 10 MG CAPS Take by mouth.     calcium-vitamin D (OSCAL WITH D) 500-5 MG-MCG tablet Take 1 tablet by mouth daily with breakfast.     clobetasol (TEMOVATE) 0.05 % external solution Apply topically.     Darbepoetin Alfa (ARANESP) 500 MCG/ML SOSY injection Inject 500 mcg into the skin See admin instructions. Inject 500 mcg into the skin every week     desonide (DESOWEN) 0.05 % cream Apply topically.     levothyroxine (SYNTHROID) 25 MCG tablet TAKE 1 TABLET BY MOUTH ONCE A DAY BEFOREBREAKFAST. 90 tablet 3   loratadine (CLARITIN) 10 MG tablet Take 10 mg by mouth daily.     ondansetron (ZOFRAN ODT) 8 MG disintegrating tablet Take 1 tablet (8 mg total) by mouth every 8 (eight) hours as needed for nausea or vomiting. 30 tablet 0   Vitamin D, Ergocalciferol, (DRISDOL) 1.25 MG (50000 UNIT) CAPS capsule TAKE ONE CAPSULE BY MOUTH ONCE A WEEK 12 capsule 1   No current  facility-administered medications for this visit.      PHYSICAL EXAMINATION:   Vitals:   03/01/23 1042  BP: 108/68  Pulse: 75  Temp: (!) 97 F (36.1 C)  SpO2: 98%      Filed Weights   03/01/23 1042  Weight: 152 lb (68.9 kg)       Physical Exam HENT:     Head: Normocephalic and atraumatic.     Mouth/Throat:     Pharynx: No oropharyngeal exudate.  Eyes:     Pupils: Pupils are equal, round, and reactive to light.  Cardiovascular:     Rate and Rhythm: Normal rate and regular rhythm.     Pulses: Normal pulses.     Heart sounds: Normal heart sounds.  Pulmonary:     Effort: Pulmonary effort is normal. No respiratory distress.     Breath sounds: Normal breath sounds. No wheezing.  Abdominal:     General: Bowel sounds are normal. There is no distension.     Palpations: Abdomen is soft. There is no mass.     Tenderness: There is no abdominal tenderness. There is no guarding or rebound.  Musculoskeletal:        General: No tenderness. Normal range of motion.     Cervical back: Normal range of motion and neck supple.  Skin:    General: Skin is warm.  Neurological:     Mental Status: She is alert and oriented to person, place, and time.  Psychiatric:        Mood and Affect: Affect normal.     LABORATORY DATA:  I have reviewed the data as listed Lab Results  Component Value Date   WBC 10.6 (H) 03/01/2023   HGB 10.5 (L) 03/01/2023   HCT 33.2 (L) 03/01/2023   MCV 96.0 03/01/2023   PLT 260 03/01/2023   Recent Labs    01/18/23 1443 02/08/23 0927 03/01/23 1041  NA 137 138 138  K 4.3 4.1 4.1  CL 104 104 105  CO2 25 24 23   GLUCOSE 150* 140* 144*  BUN 20 19 20   CREATININE 1.09* 1.01* 1.07*  CALCIUM 9.3 9.1 9.1  GFRNONAA 54* 59* 55*  PROT 8.4* 8.2* 8.3*  ALBUMIN 4.2 4.3 4.3  AST 22 23 24   ALT 16 14 30   ALKPHOS 49 45 48  BILITOT 0.5 0.8 0.9     No results found.  MDS (myelodysplastic syndrome), low grade (HCC)  # Low grade MDS- with ringed  sideroblasts; no blasts. POSITIVE  For SF3B1; R-IPSS-low risk. MARCH 2023-bone marrow biopsy no evidence of obvious progression of MDS or any acute process.  Currently on Aranesp 100 mcg every 2-3 week plus Luspatercept.   # proceed with Luspatercept # 15 today  Continue with Aranesp prn for Hb < 10. Today- Hb 10.5 -overall stable.  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 mprovement with claritin. urrently awaiting  appt with [facility] rheumatology re: joint pains in 2 weeks.   # vit D def- FEB 2024-Vitamin D- 25/low-  on Vit D 50-continue vitamin D weekly.  Vit D pending today.   # CKD- stage III- 50s- monitor for now- stable.  # Vaccination: s/p flu shot. Consider/recommend COVID/ RSV-   # DISPOSITION:  # proceed with Luspatecept Today; HOLD  Aranesp today # follow up in 3 weeks--MD;  labs CBC/cmp;  LDH;  possible aranesp; Luspatercept  SQ-  Dr.B  All questions were answered. The patient knows to call the clinic with any problems, questions or concerns.   Earna Coder, MD 03/01/2023 11:55 AM

## 2023-03-15 DIAGNOSIS — M47816 Spondylosis without myelopathy or radiculopathy, lumbar region: Secondary | ICD-10-CM | POA: Diagnosis not present

## 2023-03-15 DIAGNOSIS — M159 Polyosteoarthritis, unspecified: Secondary | ICD-10-CM | POA: Diagnosis not present

## 2023-03-15 DIAGNOSIS — M25551 Pain in right hip: Secondary | ICD-10-CM | POA: Diagnosis not present

## 2023-03-15 DIAGNOSIS — M545 Low back pain, unspecified: Secondary | ICD-10-CM | POA: Diagnosis not present

## 2023-03-15 DIAGNOSIS — M4316 Spondylolisthesis, lumbar region: Secondary | ICD-10-CM | POA: Diagnosis not present

## 2023-03-15 DIAGNOSIS — M8588 Other specified disorders of bone density and structure, other site: Secondary | ICD-10-CM | POA: Diagnosis not present

## 2023-03-15 DIAGNOSIS — G8929 Other chronic pain: Secondary | ICD-10-CM | POA: Diagnosis not present

## 2023-03-16 ENCOUNTER — Encounter: Admit: 2023-03-16 | Discharge: 2023-03-16

## 2023-03-18 ENCOUNTER — Encounter: Admit: 2023-03-18 | Discharge: 2023-03-18

## 2023-03-22 ENCOUNTER — Inpatient Hospital Stay: Payer: PPO

## 2023-03-22 ENCOUNTER — Inpatient Hospital Stay (HOSPITAL_BASED_OUTPATIENT_CLINIC_OR_DEPARTMENT_OTHER): Payer: PPO | Admitting: Internal Medicine

## 2023-03-22 ENCOUNTER — Encounter: Payer: Self-pay | Admitting: Internal Medicine

## 2023-03-22 VITALS — BP 131/59 | HR 66 | Temp 97.4°F | Ht 63.0 in | Wt 150.6 lb

## 2023-03-22 DIAGNOSIS — D46Z Other myelodysplastic syndromes: Secondary | ICD-10-CM | POA: Diagnosis not present

## 2023-03-22 DIAGNOSIS — D461 Refractory anemia with ring sideroblasts: Secondary | ICD-10-CM | POA: Diagnosis not present

## 2023-03-22 LAB — CMP (CANCER CENTER ONLY)
ALT: 15 U/L (ref 0–44)
AST: 21 U/L (ref 15–41)
Albumin: 4.5 g/dL (ref 3.5–5.0)
Alkaline Phosphatase: 44 U/L (ref 38–126)
Anion gap: 10 (ref 5–15)
BUN: 22 mg/dL (ref 8–23)
CO2: 24 mmol/L (ref 22–32)
Calcium: 9.3 mg/dL (ref 8.9–10.3)
Chloride: 103 mmol/L (ref 98–111)
Creatinine: 1.07 mg/dL — ABNORMAL HIGH (ref 0.44–1.00)
GFR, Estimated: 55 mL/min — ABNORMAL LOW (ref >60)
Glucose, Bld: 120 mg/dL — ABNORMAL HIGH (ref 70–99)
Potassium: 4.2 mmol/L (ref 3.5–5.1)
Sodium: 137 mmol/L (ref 135–145)
Total Bilirubin: 1 mg/dL (ref 0.3–1.2)
Total Protein: 8.2 g/dL — ABNORMAL HIGH (ref 6.5–8.1)

## 2023-03-22 LAB — CBC WITH DIFFERENTIAL/PLATELET
Abs Immature Granulocytes: 0.74 10*3/uL — ABNORMAL HIGH (ref 0.00–0.07)
Basophils Absolute: 0.2 10*3/uL — ABNORMAL HIGH (ref 0.0–0.1)
Basophils Relative: 1 %
Eosinophils Absolute: 0.1 10*3/uL (ref 0.0–0.5)
Eosinophils Relative: 1 %
HCT: 33.2 % — ABNORMAL LOW (ref 36.0–46.0)
Hemoglobin: 10.5 g/dL — ABNORMAL LOW (ref 12.0–15.0)
Immature Granulocytes: 6 %
Lymphocytes Relative: 20 %
Lymphs Abs: 2.6 10*3/uL (ref 0.7–4.0)
MCH: 30.3 pg (ref 26.0–34.0)
MCHC: 31.6 g/dL (ref 30.0–36.0)
MCV: 95.7 fL (ref 80.0–100.0)
Monocytes Absolute: 1.9 10*3/uL — ABNORMAL HIGH (ref 0.1–1.0)
Monocytes Relative: 14 %
Neutro Abs: 7.7 10*3/uL (ref 1.7–7.7)
Neutrophils Relative %: 58 %
Platelets: 251 10*3/uL (ref 150–400)
RBC: 3.47 MIL/uL — ABNORMAL LOW (ref 3.87–5.11)
RDW: 29.4 % — ABNORMAL HIGH (ref 11.5–15.5)
Smear Review: ADEQUATE
WBC: 13.2 10*3/uL — ABNORMAL HIGH (ref 4.0–10.5)
nRBC: 0.8 % — ABNORMAL HIGH (ref 0.0–0.2)

## 2023-03-22 LAB — LACTATE DEHYDROGENASE: LDH: 140 U/L (ref 98–192)

## 2023-03-22 MED ORDER — LUSPATERCEPT-AAMT 75 MG ~~LOC~~ SOLR
1.0000 mg/kg | Freq: Once | SUBCUTANEOUS | Status: AC
  Administered 2023-03-22: 65 mg via SUBCUTANEOUS
  Filled 2023-03-22: qty 1.3

## 2023-03-22 NOTE — Progress Notes (Signed)
[facility] CONSULT NOTE  Patient Care Team: [name], Audrie Gallus, MD as PCP - General Donneta Romberg, Worthy Flank, MD as Consulting Physician (Hematology and Oncology)  CHIEF COMPLAINTS/PURPOSE OF CONSULTATION: MDS   Oncology History Overview Note   # July 2020-myelodysplastic syndrome with ringed sideroblasts-Normal karyotype; no blasts [IPSS R-Very low risk ~median survival 8.3 years]; iron studies B12 folic acid myeloma panel normal; pyridoxine levels/copper/zinc-WNL. Erythropoietin levels-60. II OPINION at [facility] [Dr.DeCastro]  # [facility]/ NGS: TET2([id number])c.2524delT(p.Ser842GlnfsTer31) Exon 3 frame-shift SF3B1([id number])c.2098A>G(p.Lys700Glu) Exon 15 missense  DNMT3A(NM_[id number])c.2204A>G(p.Tyr735Cys) Exon 19 missense   # JAN 11th 2020- Aranesp/retacrit;  # July 30th, 2021- START REVLIMID 10 mg/day  [SF3B39mutation]  MARCH 2023- BONE MARROW, ASPIRATE, CLOT, CORE:  -  Hypercellular marrow involved by myelodysplastic syndrome with ring  sideroblasts (MDS-RS)  -  Polytypic plasmacytosis   # [facility] 2023-increase the frequency of Aranesp 400 mcg every 2 weeks; continue Revlimid.   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; September 2021 ejection fraction 60 to 65%;   # colonoscopy- 2016 [Dr.Bucinni];  DIAGNOSIS: MDS/low-grade with ringed sideroblasts  STAGE: Low       ;  GOALS: Control  CURRENT/MOST RECENT THERAPY : EPO agent+ REV    MDS (myelodysplastic syndrome), low grade (HCC)  04/23/2019 Initial Diagnosis   MDS (myelodysplastic syndrome), low grade (HCC)   05/11/2022 - 05/11/2022 Chemotherapy   Patient is on Treatment Plan : MYELODYSPLASIA Luspatercept q21d     05/11/2022 -  Chemotherapy   Patient is on Treatment Plan : MYELODYSPLASIA Luspatercept q21d      HISTORY OF PRESENTING ILLNESS: Accompanied by husband ambulating independently.  Carla [name] 72 y.o.  female history of low-grade MDS anemia  with ring sideroblasts currently on Aranesp/Luspatercept is here for follow-up.    Patient denies any worsening shortness of breath or cough.  Denies any leg swelling.  C/o generalized joint pain x2 months getter worse.  However slightly improved on Claritin.  Patient currently awaiting appointment with rheumatology at [facility] for Joint pains.  Appetite is fairly good. Still taking Calcium+D with the prescription Vitamin D.   Review of Systems  Constitutional:  Positive for malaise/fatigue. Negative for chills, diaphoresis and fever.  HENT:  Negative for nosebleeds and sore throat.   Eyes:  Negative for double vision.  Respiratory:  Negative for cough, hemoptysis, sputum production, shortness of breath and wheezing.   Cardiovascular:  Negative for chest pain and orthopnea.  Gastrointestinal:  Negative for abdominal pain, blood in stool, constipation, diarrhea, heartburn, melena and vomiting.  Genitourinary:  Negative for dysuria, frequency and urgency.  Musculoskeletal:  Positive for back pain and joint pain.  Skin: Negative.  Negative for itching and rash.  Neurological:  Negative for dizziness, tingling, focal weakness, weakness and headaches.  Endo/Heme/Allergies:  Does not bruise/bleed easily.  Psychiatric/Behavioral:  Negative for depression. The patient is not nervous/anxious and does not have insomnia.     MEDICAL HISTORY:  Past Medical History:  Diagnosis Date   Allergic rhinitis, cause unspecified    Anemia, unspecified    Carpal tunnel syndrome    Cystitis, unspecified    Family history of osteoporosis    PONV (postoperative nausea and vomiting)    Postnasal drip    Syncope and collapse     SURGICAL HISTORY: Past Surgical History:  Procedure Laterality Date   BONE MARROW BIOPSY     CARPAL TUNNEL RELEASE     COLONOSCOPY  09/28/2003   COLONOSCOPY WITH PROPOFOL  N/A 07/31/2015   Procedure: COLONOSCOPY WITH PROPOFOL;  Surgeon: Bernette Redbird, MD;  Location: [facility];   Service: Endoscopy;  Laterality: N/A;   DIAGNOSTIC LAPAROSCOPY     dx lack of pregnancy    EYE SURGERY     lasik, cataract, prk,yag procedure    SOCIAL HISTORY: Social History   Socioeconomic History   Marital status: Married    Spouse name: Not on file   Number of children: Not on file   Years of education: Not on file   Highest education level: Not on file  Occupational History   Not on file  Tobacco Use   Smoking status: Never   Smokeless tobacco: Never  Vaping Use   Vaping Use: Never used  Substance and Sexual Activity   Alcohol use: Yes    Alcohol/week: 0.0 standard drinks of alcohol    Comment: Rare   Drug use: No   Sexual activity: Not Currently  Other Topics Concern   Not on file  Social History Narrative   No regular exercise      Drinks lots of [employer]         Social Determinants of Health   Financial Resource Strain: Low Risk  (05/20/2022)   Overall Financial Resource Strain (CARDIA)    Difficulty of Paying Living Expenses: Not very hard  Food Insecurity: No Food Insecurity (05/20/2022)   Hunger Vital Sign    Worried About Running Out of Food in the Last Year: Never true    Ran Out of Food in the Last Year: Never true  Transportation Needs: No Transportation Needs (05/20/2022)   PRAPARE - Administrator, Civil Service (Medical): No    Lack of Transportation (Non-Medical): No  Physical Activity: Inactive (05/20/2022)   Exercise Vital Sign    Days of Exercise per Week: 0 days    Minutes of Exercise per Session: 0 min  Stress: No Stress Concern Present (05/20/2022)   [employer] of Occupational Health - Occupational Stress Questionnaire    Feeling of Stress : Only a little  Social Connections: Moderately Integrated (05/20/2022)   Social Connection and Isolation Panel [NHANES]    Frequency of Communication with Friends and Family: More than three times a week    Frequency of Social Gatherings with Friends and Family: Once a week    Attends  Religious Services: More than 4 times per year    Active Member of [employer] or Organizations: No    Attends Banker Meetings: Never    Marital Status: Married  Catering manager Violence: Not At Risk (05/20/2022)   Humiliation, Afraid, Rape, and Kick questionnaire    Fear of Current or Ex-Partner: No    Emotionally Abused: No    Physically Abused: No    Sexually Abused: No    FAMILY HISTORY: Family History  Problem Relation Age of Onset   Osteoporosis Mother    Stroke Mother    Coronary artery disease Father    Stroke Father 36   Diabetes Other        Aunts and uncles   Breast cancer Paternal Aunt    Breast cancer Maternal Aunt    Arthritis/Rheumatoid Child    Breast cancer Cousin     ALLERGIES:  is allergic to codeine.  MEDICATIONS:  Current Outpatient Medications  Medication Sig Dispense Refill   acetaminophen (TYLENOL) 500 MG tablet Take 500-1,000 mg by mouth every 6 (six) hours as needed for mild pain or headache.  aspirin EC 81 MG tablet Take 81 mg by mouth at bedtime. Swallow whole.      Biotin 10 MG CAPS Take by mouth.     calcium-vitamin D (OSCAL WITH D) 500-5 MG-MCG tablet Take 1 tablet by mouth daily with breakfast.     clobetasol (TEMOVATE) 0.05 % external solution Apply topically.     Darbepoetin Alfa (ARANESP) 500 MCG/ML SOSY injection Inject 500 mcg into the skin See admin instructions. Inject 500 mcg into the skin every week     desonide (DESOWEN) 0.05 % cream Apply topically.     DULoxetine (CYMBALTA) 30 MG capsule Take by mouth.     levothyroxine (SYNTHROID) 25 MCG tablet TAKE 1 TABLET BY MOUTH ONCE A DAY BEFOREBREAKFAST. 90 tablet 3   loratadine (CLARITIN) 10 MG tablet Take 10 mg by mouth daily.     luspatercept-aamt (REBLOZYL) 75 MG subcutaneous injection Inject into the skin.     ondansetron (ZOFRAN ODT) 8 MG disintegrating tablet Take 1 tablet (8 mg total) by mouth every 8 (eight) hours as needed for nausea or vomiting. 30 tablet 0   Vitamin  D, Ergocalciferol, (DRISDOL) 1.25 MG (50000 UNIT) CAPS capsule TAKE ONE CAPSULE BY MOUTH ONCE A WEEK 12 capsule 1   No current facility-administered medications for this visit.   Facility-Administered Medications Ordered in Other Visits  Medication Dose Route Frequency Provider Last Rate Last Admin   luspatercept-aamt (REBLOZYL) subcutaneous injection 65 mg  1 mg/kg (Treatment Plan Recorded) Subcutaneous Once Earna Coder, MD          PHYSICAL EXAMINATION:   Vitals:   03/22/23 1033  BP: (!) 131/59  Pulse: 66  Temp: (!) 97.4 F (36.3 C)  SpO2: 100%      Filed Weights   03/22/23 1033  Weight: 150 lb 9.6 oz (68.3 kg)       Physical Exam HENT:     Head: Normocephalic and atraumatic.     Mouth/Throat:     Pharynx: No oropharyngeal exudate.  Eyes:     Pupils: Pupils are equal, round, and reactive to light.  Cardiovascular:     Rate and Rhythm: Normal rate and regular rhythm.     Pulses: Normal pulses.     Heart sounds: Normal heart sounds.  Pulmonary:     Effort: Pulmonary effort is normal. No respiratory distress.     Breath sounds: Normal breath sounds. No wheezing.  Abdominal:     General: Bowel sounds are normal. There is no distension.     Palpations: Abdomen is soft. There is no mass.     Tenderness: There is no abdominal tenderness. There is no guarding or rebound.  Musculoskeletal:        General: No tenderness. Normal range of motion.     Cervical back: Normal range of motion and neck supple.  Skin:    General: Skin is warm.  Neurological:     Mental Status: She is alert and oriented to person, place, and time.  Psychiatric:        Mood and Affect: Affect normal.     LABORATORY DATA:  I have reviewed the data as listed Lab Results  Component Value Date   WBC 13.2 (H) 03/22/2023   HGB 10.5 (L) 03/22/2023   HCT 33.2 (L) 03/22/2023   MCV 95.7 03/22/2023   PLT 251 03/22/2023   Recent Labs    02/08/23 0927 03/01/23 1041 03/22/23 1029   NA 138 138 137  K 4.1 4.1 4.2  CL 104 105 103  CO2 24 23 24   GLUCOSE 140* 144* 120*  BUN 19 20 22   CREATININE 1.01* 1.07* 1.07*  CALCIUM 9.1 9.1 9.3  GFRNONAA 59* 55* 55*  PROT 8.2* 8.3* 8.2*  ALBUMIN 4.3 4.3 4.5  AST 23 24 21   ALT 14 30 15   ALKPHOS 45 48 44  BILITOT 0.8 0.9 1.0     No results found.  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 POSITIVE  For SF3B1; R-IPSS-low risk. MARCH 2023-bone marrow biopsy no evidence of obvious progression of MDS or any acute process.  Currently on Aranesp 100 mcg every 2-3 week plus Luspatercept.   # proceed with Luspatercept # 15 today  Continue with Aranesp prn for Hb < 10. Today- Hb 10.5 -overall stable.  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 mprovement with claritin. urrently awaiting  appt with [facility] rheumatology re: joint pains in 2 weeks.   # vit D def- FEB 2024-Vitamin D- 25/low-  on Vit D 50-continue vitamin D weekly.  Vit D pending today.   # CKD- stage III- 50s- monitor for now- stable.  # Vaccination: s/p flu shot. Consider/recommend COVID/ RSV-   # DISPOSITION:  # proceed with Luspatecept Today; HOLD  Aranesp today # follow up in 3 weeks--MD;  labs CBC/cmp;  LDH;  possible aranesp; Luspatercept  SQ-  Dr.B  All questions were answered. The patient knows to call the clinic with any problems, questions or concerns.   Earna Coder, MD 03/22/2023 11:10 AM

## 2023-03-22 NOTE — Assessment & Plan Note (Addendum)
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 MARCH 2023-bone marrow biopsy no evidence of obvious progression of MDS or any acute process.  Currently on Aranesp 100 mcg every 2-3 week plus Luspatercept.   # proceed with Luspatercept # 16  today  Continue with Aranesp prn for Hb < 10. Today- Hb 10.5 -overall stable.  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 mprovement with claritin. S/p  appt with [facility] rheumatology; on cymblata.  Awaiting PT/water aerobics.    # vit D def- FEB 2024-Vitamin D- 25/low-  on Vit D 50-continue vitamin D weekly. JUNE 2024-  Vit D-40- stable.    # CKD- stage III- 50s- monitor for now- stable.  # Vaccination: s/p flu shot. Consider/recommend COVID/ RSV-   # DISPOSITION:  # proceed with Luspatecept Today; HOLD  Aranesp today # follow up in 3 weeks--MD;  labs CBC/cmp;  LDH;  possible aranesp; Luspatercept  SQ-  Dr.B

## 2023-03-24 ENCOUNTER — Encounter: Admit: 2023-03-24 | Discharge: 2023-03-24

## 2023-04-12 ENCOUNTER — Inpatient Hospital Stay: Payer: PPO

## 2023-04-12 ENCOUNTER — Encounter: Payer: Self-pay | Admitting: Internal Medicine

## 2023-04-12 ENCOUNTER — Inpatient Hospital Stay: Payer: PPO | Attending: Internal Medicine

## 2023-04-12 ENCOUNTER — Inpatient Hospital Stay: Payer: PPO | Admitting: Internal Medicine

## 2023-04-12 VITALS — BP 113/60 | HR 82 | Temp 98.3°F | Ht 63.0 in | Wt 152.0 lb

## 2023-04-12 DIAGNOSIS — D46Z Other myelodysplastic syndromes: Secondary | ICD-10-CM

## 2023-04-12 DIAGNOSIS — D461 Refractory anemia with ring sideroblasts: Secondary | ICD-10-CM | POA: Insufficient documentation

## 2023-04-12 DIAGNOSIS — N183 Chronic kidney disease, stage 3 unspecified: Secondary | ICD-10-CM | POA: Insufficient documentation

## 2023-04-12 DIAGNOSIS — E559 Vitamin D deficiency, unspecified: Secondary | ICD-10-CM | POA: Diagnosis not present

## 2023-04-12 LAB — CMP (CANCER CENTER ONLY)
ALT: 16 U/L (ref 0–44)
AST: 20 U/L (ref 15–41)
Albumin: 4.2 g/dL (ref 3.5–5.0)
Alkaline Phosphatase: 41 U/L (ref 38–126)
Anion gap: 8 (ref 5–15)
BUN: 23 mg/dL (ref 8–23)
CO2: 24 mmol/L (ref 22–32)
Calcium: 8.9 mg/dL (ref 8.9–10.3)
Chloride: 103 mmol/L (ref 98–111)
Creatinine: 1.16 mg/dL — ABNORMAL HIGH (ref 0.44–1.00)
GFR, Estimated: 50 mL/min — ABNORMAL LOW (ref >60)
Glucose, Bld: 97 mg/dL (ref 70–99)
Potassium: 4.1 mmol/L (ref 3.5–5.1)
Sodium: 135 mmol/L (ref 135–145)
Total Bilirubin: 0.5 mg/dL (ref 0.3–1.2)
Total Protein: 7.9 g/dL (ref 6.5–8.1)

## 2023-04-12 LAB — CBC WITH DIFFERENTIAL/PLATELET
Abs Immature Granulocytes: 0.76 10*3/uL — ABNORMAL HIGH (ref 0.00–0.07)
Basophils Absolute: 0.1 10*3/uL (ref 0.0–0.1)
Basophils Relative: 1 %
Eosinophils Absolute: 0.1 10*3/uL (ref 0.0–0.5)
Eosinophils Relative: 1 %
HCT: 30.4 % — ABNORMAL LOW (ref 36.0–46.0)
Hemoglobin: 9.8 g/dL — ABNORMAL LOW (ref 12.0–15.0)
Immature Granulocytes: 6 %
Lymphocytes Relative: 22 %
Lymphs Abs: 2.8 10*3/uL (ref 0.7–4.0)
MCH: 30.4 pg (ref 26.0–34.0)
MCHC: 32.2 g/dL (ref 30.0–36.0)
MCV: 94.4 fL (ref 80.0–100.0)
Monocytes Absolute: 2 10*3/uL — ABNORMAL HIGH (ref 0.1–1.0)
Monocytes Relative: 16 %
Neutro Abs: 7.1 10*3/uL (ref 1.7–7.7)
Neutrophils Relative %: 54 %
Platelets: 263 10*3/uL (ref 150–400)
RBC: 3.22 MIL/uL — ABNORMAL LOW (ref 3.87–5.11)
RDW: 29.6 % — ABNORMAL HIGH (ref 11.5–15.5)
Smear Review: NORMAL
WBC: 12.8 10*3/uL — ABNORMAL HIGH (ref 4.0–10.5)
nRBC: 1.6 % — ABNORMAL HIGH (ref 0.0–0.2)

## 2023-04-12 LAB — LACTATE DEHYDROGENASE: LDH: 138 U/L (ref 98–192)

## 2023-04-12 MED ORDER — LUSPATERCEPT-AAMT 75 MG ~~LOC~~ SOLR
1.0000 mg/kg | Freq: Once | SUBCUTANEOUS | Status: AC
  Administered 2023-04-12: 65 mg via SUBCUTANEOUS
  Filled 2023-04-12: qty 1.3

## 2023-04-12 MED ORDER — DARBEPOETIN ALFA 500 MCG/ML IJ SOSY
500.0000 ug | PREFILLED_SYRINGE | Freq: Once | INTRAMUSCULAR | Status: AC
  Administered 2023-04-12: 500 ug via SUBCUTANEOUS

## 2023-04-12 NOTE — Progress Notes (Signed)
[facility] CONSULT NOTE  Patient Care Team: [name], Audrie Gallus, MD as PCP - General Donneta Romberg, Worthy Flank, MD as Consulting Physician (Hematology and Oncology)  CHIEF COMPLAINTS/PURPOSE OF CONSULTATION: MDS   Oncology History Overview Note   # July 2020-myelodysplastic syndrome with ringed sideroblasts-Normal karyotype; no blasts [IPSS R-Very low risk ~median survival 8.3 years]; iron studies B12 folic acid myeloma panel normal; pyridoxine levels/copper/zinc-WNL. Erythropoietin levels-60. II OPINION at [facility] [Dr.DeCastro]  # [facility]/ NGS: TET2([id number])c.2524delT(p.Ser842GlnfsTer31) Exon 3 frame-shift SF3B1([id number])c.2098A>G(p.Lys700Glu) Exon 15 missense  DNMT3A(NM_[id number])c.2204A>G(p.Tyr735Cys) Exon 19 missense   # JAN 11th 2020- Aranesp/retacrit;  # July 30th, 2021- START REVLIMID 10 mg/day  [SF3B47mutation]  MARCH 2023- BONE MARROW, ASPIRATE, CLOT, CORE:  -  Hypercellular marrow involved by myelodysplastic syndrome with ring  sideroblasts (MDS-RS)  -  Polytypic plasmacytosis   # [facility] 2023-increase the frequency of Aranesp 400 mcg every 2 weeks; continue Revlimid.   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 ambulating independently.  Carla [name] 72 y.o.  female history of low-grade MDS anemia with ring  sideroblasts currently on Aranesp/Luspatercept is here for follow-up.    Patient denies any worsening shortness of breath or cough.  Denies any leg swelling.  C/o generalized joint pain x2 months getter worse.  However slightly improved on Claritin.  Patient currently awaiting appointment with rheumatology at [facility] for Joint pains.  Appetite is fairly good. Still taking Calcium+D with the prescription Vitamin D.   Review of Systems  Constitutional:  Positive for malaise/fatigue. Negative for chills, diaphoresis and fever.  HENT:  Negative for nosebleeds and sore throat.   Eyes:  Negative for double vision.  Respiratory:  Negative for cough, hemoptysis, sputum production, shortness of breath and wheezing.   Cardiovascular:  Negative for chest pain and orthopnea.  Gastrointestinal:  Negative for abdominal pain, blood in stool, constipation, diarrhea, heartburn, melena and vomiting.  Genitourinary:  Negative for dysuria, frequency and urgency.  Musculoskeletal:  Positive for back pain and joint pain.  Skin: Negative.  Negative for itching and rash.  Neurological:  Negative for dizziness, tingling, focal weakness, weakness and headaches.  Endo/Heme/Allergies:  Does not bruise/bleed easily.  Psychiatric/Behavioral:  Negative for depression. The patient is not nervous/anxious and does not have insomnia.     MEDICAL HISTORY:  Past Medical History:  Diagnosis Date   Allergic rhinitis, cause unspecified    Anemia, unspecified    Carpal tunnel syndrome    Cystitis, unspecified    Family history of osteoporosis    PONV (postoperative nausea and vomiting)    Postnasal drip    Syncope and collapse     SURGICAL HISTORY: Past Surgical History:  Procedure Laterality Date   BONE MARROW BIOPSY     CARPAL TUNNEL RELEASE     COLONOSCOPY  09/28/2003   COLONOSCOPY WITH PROPOFOL N/A 07/31/2015  Procedure: COLONOSCOPY WITH PROPOFOL;  Surgeon: Bernette Redbird, MD;  Location: [facility];  Service:  Endoscopy;  Laterality: N/A;   DIAGNOSTIC LAPAROSCOPY     dx lack of pregnancy    EYE SURGERY     lasik, cataract, prk,yag procedure    SOCIAL HISTORY: Social History   Socioeconomic History   Marital status: Married    Spouse name: Not on file   Number of children: Not on file   Years of education: Not on file   Highest education level: Not on file  Occupational History   Not on file  Tobacco Use   Smoking status: Never   Smokeless tobacco: Never  Vaping Use   Vaping status: Never Used  Substance and Sexual Activity   Alcohol use: Yes    Alcohol/week: 0.0 standard drinks of alcohol    Comment: Rare   Drug use: No   Sexual activity: Not Currently  Other Topics Concern   Not on file  Social History Narrative   No regular exercise      Drinks lots of [employer]         Social Determinants of Health   Financial Resource Strain: Low Risk  (05/20/2022)   Overall Financial Resource Strain (CARDIA)    Difficulty of Paying Living Expenses: Not very hard  Food Insecurity: No Food Insecurity (05/20/2022)   Hunger Vital Sign    Worried About Running Out of Food in the Last Year: Never true    Ran Out of Food in the Last Year: Never true  Transportation Needs: No Transportation Needs (05/20/2022)   PRAPARE - Administrator, Civil Service (Medical): No    Lack of Transportation (Non-Medical): No  Physical Activity: Inactive (05/20/2022)   Exercise Vital Sign    Days of Exercise per Week: 0 days    Minutes of Exercise per Session: 0 min  Stress: No Stress Concern Present (05/20/2022)   [employer] of Occupational Health - Occupational Stress Questionnaire    Feeling of Stress : Only a little  Social Connections: Moderately Integrated (05/20/2022)   Social Connection and Isolation Panel [NHANES]    Frequency of Communication with Friends and Family: More than three times a week    Frequency of Social Gatherings with Friends and Family: Once a week    Attends  Religious Services: More than 4 times per year    Active Member of [employer] or Organizations: No    Attends Banker Meetings: Never    Marital Status: Married  Catering manager Violence: Not At Risk (05/20/2022)   Humiliation, Afraid, Rape, and Kick questionnaire    Fear of Current or Ex-Partner: No    Emotionally Abused: No    Physically Abused: No    Sexually Abused: No    FAMILY HISTORY: Family History  Problem Relation Age of Onset   Osteoporosis Mother    Stroke Mother    Coronary artery disease Father    Stroke Father 71   Diabetes Other        Aunts and uncles   Breast cancer Paternal Aunt    Breast cancer Maternal Aunt    Arthritis/Rheumatoid Child    Breast cancer Cousin     ALLERGIES:  is allergic to codeine.  MEDICATIONS:  Current Outpatient Medications  Medication Sig Dispense Refill   acetaminophen (TYLENOL) 500 MG tablet Take 500-1,000 mg by mouth every 6 (six) hours as needed for mild pain or headache.     aspirin EC 81 MG  tablet Take 81 mg by mouth at bedtime. Swallow whole.      Biotin 10 MG CAPS Take by mouth.     calcium-vitamin D (OSCAL WITH D) 500-5 MG-MCG tablet Take 1 tablet by mouth daily with breakfast.     clobetasol (TEMOVATE) 0.05 % external solution Apply topically.     Darbepoetin Alfa (ARANESP) 500 MCG/ML SOSY injection Inject 500 mcg into the skin See admin instructions. Inject 500 mcg into the skin every week     desonide (DESOWEN) 0.05 % cream Apply topically.     DULoxetine (CYMBALTA) 30 MG capsule Take by mouth.     levothyroxine (SYNTHROID) 25 MCG tablet TAKE 1 TABLET BY MOUTH ONCE A DAY BEFOREBREAKFAST. 90 tablet 3   loratadine (CLARITIN) 10 MG tablet Take 10 mg by mouth daily.     luspatercept-aamt (REBLOZYL) 75 MG subcutaneous injection Inject into the skin.     ondansetron (ZOFRAN ODT) 8 MG disintegrating tablet Take 1 tablet (8 mg total) by mouth every 8 (eight) hours as needed for nausea or vomiting. 30 tablet 0   Vitamin  D, Ergocalciferol, (DRISDOL) 1.25 MG (50000 UNIT) CAPS capsule TAKE ONE CAPSULE BY MOUTH ONCE A WEEK 12 capsule 1   No current facility-administered medications for this visit.      PHYSICAL EXAMINATION:   Vitals:   04/12/23 1456  BP: 113/60  Pulse: 82  Temp: 98.3 F (36.8 C)  SpO2: 97%       Filed Weights   04/12/23 1456  Weight: 152 lb (68.9 kg)        Physical Exam HENT:     Head: Normocephalic and atraumatic.     Mouth/Throat:     Pharynx: No oropharyngeal exudate.  Eyes:     Pupils: Pupils are equal, round, and reactive to light.  Cardiovascular:     Rate and Rhythm: Normal rate and regular rhythm.     Pulses: Normal pulses.     Heart sounds: Normal heart sounds.  Pulmonary:     Effort: Pulmonary effort is normal. No respiratory distress.     Breath sounds: Normal breath sounds. No wheezing.  Abdominal:     General: Bowel sounds are normal. There is no distension.     Palpations: Abdomen is soft. There is no mass.     Tenderness: There is no abdominal tenderness. There is no guarding or rebound.  Musculoskeletal:        General: No tenderness. Normal range of motion.     Cervical back: Normal range of motion and neck supple.  Skin:    General: Skin is warm.  Neurological:     Mental Status: She is alert and oriented to person, place, and time.  Psychiatric:        Mood and Affect: Affect normal.     LABORATORY DATA:  I have reviewed the data as listed Lab Results  Component Value Date   WBC 12.8 (H) 04/12/2023   HGB 9.8 (L) 04/12/2023   HCT 30.4 (L) 04/12/2023   MCV 94.4 04/12/2023   PLT 263 04/12/2023   Recent Labs    03/01/23 1041 03/22/23 1029 04/12/23 1455  NA 138 137 135  K 4.1 4.2 4.1  CL 105 103 103  CO2 23 24 24   GLUCOSE 144* 120* 97  BUN 20 22 23   CREATININE 1.07* 1.07* 1.16*  CALCIUM 9.1 9.3 8.9  GFRNONAA 55* 55* 50*  PROT 8.3* 8.2* 7.9  ALBUMIN 4.3 4.5 4.2  AST 24 21 20  ALT 30 15 16   ALKPHOS 48 44 41  BILITOT  0.9 1.0 0.5     No results found.  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 POSITIVE  For SF3B1; R-IPSS-low risk. MARCH 2023-bone marrow biopsy no evidence of obvious progression of MDS or any acute process.  Currently on Aranesp 100 mcg every 2-3 week plus Luspatercept.   # proceed with Luspatercept # 17  today  Continue with Aranesp prn for Hb < 10. Today- Hb 9.8-overall stable.  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 mprovement with claritin. S/p  appt with [facility] rheumatology; OFF cymblata [constipation]; awaiting on PT/water aerobics- stable.    # vit D def- FEB 2024-Vitamin D- 25/low-  on Vit D 50-continue vitamin D weekly. JUNE 2024-  Vit D-40- stable.    # CKD- stage III- 50s- monitor for now- stable.  # Vaccination: s/p flu shot. Consider/recommend COVID/ RSV-   # DISPOSITION:  # proceed with Luspatecept Today; and Aranesp today # follow up in 3 weeks--MD;  labs CBC/cmp;  LDH;  possible aranesp; Luspatercept  SQ-  Dr.B  All questions were answered. The patient knows to call the clinic with any problems, questions or concerns.   Earna Coder, MD 04/12/2023 10:12 PM

## 2023-04-12 NOTE — Assessment & Plan Note (Addendum)
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 MARCH 2023-bone marrow biopsy no evidence of obvious progression of MDS or any acute process.  Currently on Aranesp 100 mcg every 2-3 week plus Luspatercept.   # proceed with Luspatercept # 17  today  Continue with Aranesp prn for Hb < 10. Today- Hb 9.8-overall stable.  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 mprovement with claritin. S/p  appt with [facility] rheumatology; OFF cymblata [constipation]; awaiting on PT/water aerobics- stable.    # vit D def- FEB 2024-Vitamin D- 25/low-  on Vit D 50-continue vitamin D weekly. JUNE 2024-  Vit D-40- stable.    # CKD- stage III- 50s- monitor for now- stable.  # Vaccination: s/p flu shot. Consider/recommend COVID/ RSV-   # DISPOSITION:  # proceed with Luspatecept Today; and Aranesp today # follow up in 3 weeks--MD;  labs CBC/cmp;  LDH;  possible aranesp; Luspatercept  SQ-  Dr.B

## 2023-04-12 NOTE — Progress Notes (Signed)
No concerns today 

## 2023-04-27 ENCOUNTER — Encounter (INDEPENDENT_AMBULATORY_CARE_PROVIDER_SITE_OTHER): Payer: Self-pay

## 2023-05-03 ENCOUNTER — Encounter: Payer: Self-pay | Admitting: Internal Medicine

## 2023-05-03 ENCOUNTER — Inpatient Hospital Stay: Payer: PPO | Attending: Internal Medicine

## 2023-05-03 ENCOUNTER — Inpatient Hospital Stay (HOSPITAL_BASED_OUTPATIENT_CLINIC_OR_DEPARTMENT_OTHER): Payer: PPO | Admitting: Internal Medicine

## 2023-05-03 ENCOUNTER — Inpatient Hospital Stay: Payer: PPO

## 2023-05-03 VITALS — BP 126/70 | HR 68 | Temp 98.0°F | Resp 17 | Wt 150.0 lb

## 2023-05-03 DIAGNOSIS — L930 Discoid lupus erythematosus: Secondary | ICD-10-CM | POA: Insufficient documentation

## 2023-05-03 DIAGNOSIS — E559 Vitamin D deficiency, unspecified: Secondary | ICD-10-CM | POA: Insufficient documentation

## 2023-05-03 DIAGNOSIS — M2550 Pain in unspecified joint: Secondary | ICD-10-CM | POA: Diagnosis not present

## 2023-05-03 DIAGNOSIS — R29818 Other symptoms and signs involving the nervous system: Secondary | ICD-10-CM | POA: Diagnosis not present

## 2023-05-03 DIAGNOSIS — D461 Refractory anemia with ring sideroblasts: Secondary | ICD-10-CM | POA: Diagnosis not present

## 2023-05-03 DIAGNOSIS — N183 Chronic kidney disease, stage 3 unspecified: Secondary | ICD-10-CM | POA: Insufficient documentation

## 2023-05-03 DIAGNOSIS — D46Z Other myelodysplastic syndromes: Secondary | ICD-10-CM

## 2023-05-03 DIAGNOSIS — Z803 Family history of malignant neoplasm of breast: Secondary | ICD-10-CM | POA: Diagnosis not present

## 2023-05-03 DIAGNOSIS — G620 Drug-induced polyneuropathy: Secondary | ICD-10-CM | POA: Insufficient documentation

## 2023-05-03 LAB — CBC WITH DIFFERENTIAL/PLATELET
Abs Immature Granulocytes: 0.49 10*3/uL — ABNORMAL HIGH (ref 0.00–0.07)
Basophils Absolute: 0.1 10*3/uL (ref 0.0–0.1)
Basophils Relative: 1 %
Eosinophils Absolute: 0.1 10*3/uL (ref 0.0–0.5)
Eosinophils Relative: 1 %
HCT: 33 % — ABNORMAL LOW (ref 36.0–46.0)
Hemoglobin: 10.5 g/dL — ABNORMAL LOW (ref 12.0–15.0)
Immature Granulocytes: 5 %
Lymphocytes Relative: 24 %
Lymphs Abs: 2.5 10*3/uL (ref 0.7–4.0)
MCH: 30.2 pg (ref 26.0–34.0)
MCHC: 31.8 g/dL (ref 30.0–36.0)
MCV: 94.8 fL (ref 80.0–100.0)
Monocytes Absolute: 1.7 10*3/uL — ABNORMAL HIGH (ref 0.1–1.0)
Monocytes Relative: 16 %
Neutro Abs: 5.7 10*3/uL (ref 1.7–7.7)
Neutrophils Relative %: 53 %
Platelets: 294 10*3/uL (ref 150–400)
RBC: 3.48 MIL/uL — ABNORMAL LOW (ref 3.87–5.11)
RDW: 30.2 % — ABNORMAL HIGH (ref 11.5–15.5)
Smear Review: ADEQUATE
WBC: 10.5 10*3/uL (ref 4.0–10.5)
nRBC: 1.7 % — ABNORMAL HIGH (ref 0.0–0.2)

## 2023-05-03 MED ORDER — LUSPATERCEPT-AAMT 75 MG ~~LOC~~ SOLR: 1.0000 mg/kg | Freq: Once | SUBCUTANEOUS | Status: DC

## 2023-05-03 NOTE — Progress Notes (Signed)
[name] CONSULT NOTE  Patient Care Team: [name], Audrie Gallus, MD as PCP - General Donneta Romberg, Worthy Flank, MD as Consulting Physician (Hematology and Oncology)  CHIEF COMPLAINTS/PURPOSE OF CONSULTATION: MDS   Oncology History Overview Note   # July 2020-myelodysplastic syndrome with ringed sideroblasts-Normal karyotype; no blasts [IPSS R-Very low risk ~median survival 8.3 years]; iron studies B12 folic acid myeloma panel normal; pyridoxine levels/copper/zinc-WNL. Erythropoietin levels-60. II OPINION at [facility] [Dr.DeCastro]  # [facility]/ NGS: TET2([id number])c.2524delT(p.Ser842GlnfsTer31) Exon 3 frame-shift SF3B1([id number])c.2098A>G(p.Lys700Glu) Exon 15 missense  DNMT3A([id number])c.2204A>G(p.Tyr735Cys) Exon 19 missense   # JAN 11th 2020- Aranesp/retacrit;  # July 30th, 2021- START REVLIMID 10 mg/day  [SF3B46mutation]  MARCH 2023- BONE MARROW, ASPIRATE, CLOT, CORE:  -  Hypercellular marrow involved by myelodysplastic syndrome with ring  sideroblasts (MDS-RS)  -  Polytypic plasmacytosis   # MARCh 2023-increase the frequency of Aranesp 400 mcg every 2 weeks; continue Revlimid.   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 ambulating independently.  Carla [name] 72 y.o.  female history of low-grade MDS anemia with ring  sideroblasts currently on Aranesp/Luspatercept is here for follow-up.    Patient denies any worsening shortness of breath or cough.  Denies any leg swelling.  C/o generalized joint pain x2 months getter worse.  However slightly improved on Claritin.  Patient currently awaiting appointment with rheumatology at [facility] for Joint pains.  Appetite is fairly good. Still taking Calcium+D with the prescription Vitamin D.   Review of Systems  Constitutional:  Positive for malaise/fatigue. Negative for chills, diaphoresis and fever.  HENT:  Negative for nosebleeds and sore throat.   Eyes:  Negative for double vision.  Respiratory:  Negative for cough, hemoptysis, sputum production, shortness of breath and wheezing.   Cardiovascular:  Negative for chest pain and orthopnea.  Gastrointestinal:  Negative for abdominal pain, blood in stool, constipation, diarrhea, heartburn, melena and vomiting.  Genitourinary:  Negative for dysuria, frequency and urgency.  Musculoskeletal:  Positive for back pain and joint pain.  Skin: Negative.  Negative for itching and rash.  Neurological:  Negative for dizziness, tingling, focal weakness, weakness and headaches.  Endo/Heme/Allergies:  Does not bruise/bleed easily.  Psychiatric/Behavioral:  Negative for depression. The patient is not nervous/anxious and does not have insomnia.     MEDICAL HISTORY:  Past Medical History:  Diagnosis Date   Allergic rhinitis, cause unspecified    Anemia, unspecified    Carpal tunnel syndrome    Cystitis, unspecified    Family history of osteoporosis    PONV (postoperative nausea and vomiting)    Postnasal drip    Syncope and collapse     SURGICAL HISTORY: Past Surgical History:  Procedure Laterality Date   BONE MARROW BIOPSY     CARPAL TUNNEL RELEASE     COLONOSCOPY  09/28/2003   COLONOSCOPY WITH PROPOFOL N/A 07/31/2015  Procedure: COLONOSCOPY WITH PROPOFOL;  Surgeon: Bernette Redbird, MD;  Location: [facility];  Service:  Endoscopy;  Laterality: N/A;   DIAGNOSTIC LAPAROSCOPY     dx lack of pregnancy    EYE SURGERY     lasik, cataract, prk,yag procedure    SOCIAL HISTORY: Social History   Socioeconomic History   Marital status: Married    Spouse name: Not on file   Number of children: Not on file   Years of education: Not on file   Highest education level: Not on file  Occupational History   Not on file  Tobacco Use   Smoking status: Never   Smokeless tobacco: Never  Vaping Use   Vaping status: Never Used  Substance and Sexual Activity   Alcohol use: Yes    Alcohol/week: 0.0 standard drinks of alcohol    Comment: Rare   Drug use: No   Sexual activity: Not Currently  Other Topics Concern   Not on file  Social History Narrative   No regular exercise      Drinks lots of [employer]eps[employer]         Social Determinants of Health   Financial Resource Strain: Low Risk  (05/20/2022)   Overall Financial Resource Strain (CARDIA)    Difficulty of Paying Living Expenses: Not very hard  Food Insecurity: No Food Insecurity (05/20/2022)   Hunger Vital Sign    Worried About Running Out of Food in the Last Year: Never true    Ran Out of Food in the Last Year: Never true  Transportation Needs: No Transportation Needs (05/20/2022)   PRAPARE - Administrator, Civil Service (Medical): No    Lack of Transportation (Non-Medical): No  Physical Activity: Inactive (05/20/2022)   Exercise Vital Sign    Days of Exercise per Week: 0 days    Minutes of Exercise per Session: 0 min  Stress: No Stress Concern Present (05/20/2022)   [employer] of Occupational Health - Occupational Stress Questionnaire    Feeling of Stress : Only a little  Social Connections: Moderately Integrated (05/20/2022)   Social Connection and Isolation Panel [NHANES]    Frequency of Communication with Friends and Family: More than three times a week    Frequency of Social Gatherings with Friends and Family: Once a week    Attends  Religious Services: More than 4 times per year    Active Member of [employer] or Organizations: No    Attends Banker Meetings: Never    Marital Status: Married  Catering manager Violence: Not At Risk (05/20/2022)   Humiliation, Afraid, Rape, and Kick questionnaire    Fear of Current or Ex-Partner: No    Emotionally Abused: No    Physically Abused: No    Sexually Abused: No    FAMILY HISTORY: Family History  Problem Relation Age of Onset   Osteoporosis Mother    Stroke Mother    Coronary artery disease Father    Stroke Father 56   Diabetes Other        Aunts and uncles   Breast cancer Paternal Aunt    Breast cancer Maternal Aunt    Arthritis/Rheumatoid Child    Breast cancer Cousin     ALLERGIES:  is allergic to codeine.  MEDICATIONS:  Current Outpatient Medications  Medication Sig Dispense Refill   acetaminophen (TYLENOL) 500 MG tablet Take 500-1,000 mg by mouth every 6 (six) hours as needed for mild pain or headache.     aspirin EC 81 MG  tablet Take 81 mg by mouth at bedtime. Swallow whole.      Biotin 10 MG CAPS Take by mouth.     calcium-vitamin D (OSCAL WITH D) 500-5 MG-MCG tablet Take 1 tablet by mouth daily with breakfast.     clobetasol (TEMOVATE) 0.05 % external solution Apply topically.     Darbepoetin Alfa (ARANESP) 500 MCG/ML SOSY injection Inject 500 mcg into the skin See admin instructions. Inject 500 mcg into the skin every week     desonide (DESOWEN) 0.05 % cream Apply topically.     levothyroxine (SYNTHROID) 25 MCG tablet TAKE 1 TABLET BY MOUTH ONCE A DAY BEFOREBREAKFAST. 90 tablet 3   loratadine (CLARITIN) 10 MG tablet Take 10 mg by mouth daily.     luspatercept-aamt (REBLOZYL) 75 MG subcutaneous injection Inject into the skin.     ondansetron (ZOFRAN ODT) 8 MG disintegrating tablet Take 1 tablet (8 mg total) by mouth every 8 (eight) hours as needed for nausea or vomiting. 30 tablet 0   Vitamin D, Ergocalciferol, (DRISDOL) 1.25 MG (50000 UNIT) CAPS  capsule TAKE ONE CAPSULE BY MOUTH ONCE A WEEK 12 capsule 1   No current facility-administered medications for this visit.   Facility-Administered Medications Ordered in Other Visits  Medication Dose Route Frequency Provider Last Rate Last Admin   luspatercept-aamt (REBLOZYL) subcutaneous injection 65 mg  1 mg/kg (Treatment Plan Recorded) Subcutaneous Once Earna Coder, MD          PHYSICAL EXAMINATION:   Vitals:   05/03/23 1439  BP: 126/70  Pulse: 68  Resp: 17  Temp: 98 F (36.7 C)  SpO2: 98%        Filed Weights   05/03/23 1439  Weight: 150 lb (68 kg)         Physical Exam HENT:     Head: Normocephalic and atraumatic.     Mouth/Throat:     Pharynx: No oropharyngeal exudate.  Eyes:     Pupils: Pupils are equal, round, and reactive to light.  Cardiovascular:     Rate and Rhythm: Normal rate and regular rhythm.     Pulses: Normal pulses.     Heart sounds: Normal heart sounds.  Pulmonary:     Effort: Pulmonary effort is normal. No respiratory distress.     Breath sounds: Normal breath sounds. No wheezing.  Abdominal:     General: Bowel sounds are normal. There is no distension.     Palpations: Abdomen is soft. There is no mass.     Tenderness: There is no abdominal tenderness. There is no guarding or rebound.  Musculoskeletal:        General: No tenderness. Normal range of motion.     Cervical back: Normal range of motion and neck supple.  Skin:    General: Skin is warm.  Neurological:     Mental Status: She is alert and oriented to person, place, and time.  Psychiatric:        Mood and Affect: Affect normal.     LABORATORY DATA:  I have reviewed the data as listed Lab Results  Component Value Date   WBC 10.5 05/03/2023   HGB 10.5 (L) 05/03/2023   HCT 33.0 (L) 05/03/2023   MCV 94.8 05/03/2023   PLT 294 05/03/2023   Recent Labs    03/01/23 1041 03/22/23 1029 04/12/23 1455  NA 138 137 135  K 4.1 4.2 4.1  CL 105 103 103  CO2 23  24 24   GLUCOSE 144* 120* 97  BUN 20 22 23   CREATININE 1.07* 1.07* 1.16*  CALCIUM 9.1 9.3 8.9  GFRNONAA 55* 55* 50*  PROT 8.3* 8.2* 7.9  ALBUMIN 4.3 4.5 4.2  AST 24 21 20   ALT 30 15 16   ALKPHOS 48 44 41  BILITOT 0.9 1.0 0.5     No results found.  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 POSITIVE  For SF3B1; R-IPSS-low risk. MARCH 2023-bone marrow biopsy no evidence of obvious progression of MDS or any acute process.  Currently on Aranesp 100 mcg every 2-3 week plus Luspatercept.   # proceed with Luspatercept # 18  today  Continue with Aranesp prn for Hb < 10. Today- Hb 10.5-overall stable.  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 mprovement with claritin. S/p  appt with [facility] rheumatology; OFF cymblata [constipation]; awaiting on PT/water aerobics- stable.    # vit D def- FEB 2024-Vitamin D- 25/low-  on Vit D 50-continue vitamin D weekly. JUNE 2024-  Vit D-40- stable.    # CKD- stage III- 50s- monitor for now- stable.   # Vaccination: s/p flu shot. Consider/recommend COVID/ RSV-   # DISPOSITION:  # HOLD Aranesp today # tomorrow Luspatecept  # follow up in 3 weeks--MD;  labs CBC/cmp;  LDH;  possible aranesp; Luspatercept  SQ-  Dr.B  All questions were answered. The patient knows to call the clinic with any problems, questions or concerns.   Earna Coder, MD 05/03/2023 3:10 PM

## 2023-05-03 NOTE — Assessment & Plan Note (Addendum)
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 MARCH 2023-bone marrow biopsy no evidence of obvious progression of MDS or any acute process.  Currently on Aranesp 100 mcg every 2-3 week plus Luspatercept.   # proceed with Luspatercept # 18  today  Continue with Aranesp prn for Hb < 10. Today- Hb 10.5-overall stable.  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 mprovement with claritin. S/p  appt with [facility] rheumatology; OFF cymblata [constipation]; awaiting on PT/water aerobics- stable.    # vit D def- FEB 2024-Vitamin D- 25/low-  on Vit D 50-continue vitamin D weekly. JUNE 2024-  Vit D-40- stable.    # CKD- stage III- 50s- monitor for now- stable.   # Vaccination: s/p flu shot. Consider/recommend COVID/ RSV-   # DISPOSITION:  # HOLD Aranesp today # tomorrow Luspatecept  # follow up in 3 weeks--MD;  labs CBC/cmp;  LDH;  possible aranesp; Luspatercept  SQ-  Dr.B

## 2023-05-03 NOTE — Progress Notes (Signed)
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 Has fatigue. Mild dyspnea at best. No visible blood in stool. Denies lightheadedness. Appetite is good.

## 2023-05-04 ENCOUNTER — Inpatient Hospital Stay: Payer: PPO

## 2023-05-04 ENCOUNTER — Other Ambulatory Visit: Payer: Self-pay

## 2023-05-04 DIAGNOSIS — D46Z Other myelodysplastic syndromes: Secondary | ICD-10-CM

## 2023-05-04 DIAGNOSIS — D461 Refractory anemia with ring sideroblasts: Secondary | ICD-10-CM | POA: Diagnosis not present

## 2023-05-04 MED ORDER — LUSPATERCEPT-AAMT 75 MG ~~LOC~~ SOLR
1.0000 mg/kg | Freq: Once | SUBCUTANEOUS | Status: AC
  Administered 2023-05-04: 65 mg via SUBCUTANEOUS
  Filled 2023-05-04: qty 1.3

## 2023-05-06 ENCOUNTER — Other Ambulatory Visit: Payer: Self-pay

## 2023-05-09 ENCOUNTER — Other Ambulatory Visit: Payer: Self-pay

## 2023-05-20 ENCOUNTER — Other Ambulatory Visit: Payer: Self-pay

## 2023-05-23 ENCOUNTER — Ambulatory Visit (INDEPENDENT_AMBULATORY_CARE_PROVIDER_SITE_OTHER): Payer: PPO

## 2023-05-23 VITALS — Ht 64.75 in | Wt 148.0 lb

## 2023-05-23 DIAGNOSIS — Z0000 Encounter for general adult medical examination without abnormal findings: Secondary | ICD-10-CM

## 2023-05-23 DIAGNOSIS — Z1231 Encounter for screening mammogram for malignant neoplasm of breast: Secondary | ICD-10-CM | POA: Diagnosis not present

## 2023-05-23 NOTE — Patient Instructions (Addendum)
Carla [name] , Thank you for taking time to come for your Medicare Wellness Visit. I appreciate your ongoing commitment to your health goals. Please review the following plan we discussed and let me know if I can assist you in the future.   Referrals/Orders/Follow-Ups/Clinician Recommendations: Aim for 30 minutes of exercise or brisk walking, 6-8 glasses of water, and 5 servings of fruits and vegetables each day.   You have an order for:  []   2D Mammogram  [x]   3D Mammogram  []   Bone Density     Please call for appointment:  [facility]  [address] [place] [address] [phone]  [facility] [address] [place], [place] [address] [phone]  [facility] [address] Geanie Logan [place], [place] [address] [phone]    Make sure to wear two-piece clothing.  No lotions, powders, or deodorants the day of the appointment. Make sure to bring picture ID and insurance card.  Bring list of medications you are currently taking including any supplements.   Schedule your  screening mammogram through MyChart!   Log into your MyChart account.  Go to 'Visit' (or 'Appointments' if on mobile App) --> Schedule an Appointment  Under 'Select a Reason for Visit' choose the Mammogram Screening option.  Complete the pre-visit questions and select the time and place that best fits your schedule.    This is a list of the screening recommended for you and due dates:  Health Maintenance  Topic Date Due   COVID-19 Vaccine (3 - Pfizer risk series) 12/22/2019   DTaP/Tdap/Td vaccine (2 - Td or Tdap) 06/21/2021   Medicare Annual Wellness Visit  05/21/2023   Flu Shot  04/28/2023   Mammogram  07/01/2023   Colon Cancer Screening  07/30/2025   Pneumonia Vaccine  Completed   DEXA scan (bone density measurement)  Completed   Hepatitis C Screening   Completed   Zoster (Shingles) Vaccine  Completed   HPV Vaccine  Aged Out    Advanced directives: (In Chart) A copy of your advanced directives are scanned into your chart should your provider ever need it.  Next Medicare Annual Wellness Visit scheduled for next year: Yes

## 2023-05-23 NOTE — Progress Notes (Signed)
Subjective:   Carla [name] is a 72 y.o. female who presents for Medicare Annual (Subsequent) preventive examination.  Visit Complete: Virtual  I connected with  Carla [name] on 05/23/23 by a audio enabled telemedicine application and verified that I am speaking with the correct person using two identifiers.  Patient Location: Home  Provider Location: Home Office  I discussed the limitations of evaluation and management by telemedicine. The patient expressed understanding and agreed to proceed.  Vital Signs: Because this visit was a virtual/telehealth visit, some criteria [place] be missing or patient reported. Any vitals not documented were not able to be obtained and vitals that have been documented are patient reported.    Review of Systems      Cardiac Risk Factors include: advanced age (>26men, >49 women);sedentary lifestyle     Objective:    Today's Vitals   05/23/23 0919  Weight: 148 lb (67.1 kg)  Height: 5' 4.75" (1.645 m)   Body mass index is 24.82 kg/m.     05/23/2023    9:25 AM 05/03/2023    2:29 PM 04/12/2023    2:57 PM 03/22/2023   10:38 AM 03/22/2023   10:34 AM 03/01/2023   10:42 AM 02/08/2023    9:36 AM  Advanced Directives  Does Patient Have a Medical Advance Directive? Yes Yes Yes Yes Yes Yes Yes  Type of Estate agent of [place];Living will Healthcare Power of [place];Living will Healthcare Power of [place];Living will Healthcare Power of [place];Living will Healthcare Power of [place];Living will Healthcare Power of [place];Living will Healthcare Power of [place];Living will  Does patient want to make changes to medical advance directive? No - Patient declined        Copy of Healthcare Power of Attorney in Chart? Yes - validated most recent copy scanned in chart (See row information) Yes - validated most recent copy scanned in chart (See row information)  Yes - validated most recent copy scanned in chart (See row information)        Current Medications (verified) Outpatient Encounter Medications as of 05/23/2023  Medication Sig   acetaminophen (TYLENOL) 500 MG tablet Take 500-1,000 mg by mouth every 6 (six) hours as needed for mild pain or headache.   aspirin EC 81 MG tablet Take 81 mg by mouth at bedtime. Swallow whole.    Biotin 10 MG CAPS Take by mouth.   calcium-vitamin D (OSCAL WITH D) 500-5 MG-MCG tablet Take 1 tablet by mouth daily with breakfast.   Darbepoetin Alfa (ARANESP) 500 MCG/ML SOSY injection Inject 500 mcg into the skin See admin instructions. Inject 500 mcg into the skin every week   levothyroxine (SYNTHROID) 25 MCG tablet TAKE 1 TABLET BY MOUTH ONCE A DAY BEFOREBREAKFAST.   loratadine (CLARITIN) 10 MG tablet Take 10 mg by mouth daily.   luspatercept-aamt (REBLOZYL) 75 MG subcutaneous injection Inject into the skin.   ondansetron (ZOFRAN ODT) 8 MG disintegrating tablet Take 1 tablet (8 mg total) by mouth every 8 (eight) hours as needed for nausea or vomiting.   Vitamin D, Ergocalciferol, (DRISDOL) 1.25 MG (50000 UNIT) CAPS capsule TAKE ONE CAPSULE BY MOUTH ONCE A WEEK   clobetasol (TEMOVATE) 0.05 % external solution Apply topically. (Patient not taking: Reported on 05/23/2023)   desonide (DESOWEN) 0.05 % cream Apply topically. (Patient not taking: Reported on 05/23/2023)   [DISCONTINUED] Calcium Carb-Cholecalciferol (CALCIUM-VITAMIN D3) 600-10 MG-MCG CAPS Take by mouth in the morning and at bedtime. Taking twice dailly   [DISCONTINUED] ergocalciferol (VITAMIN D2)  1.25 MG (50000 UT) capsule Take 1 capsule (50,000 Units total) by mouth once a week. (Patient not taking: Reported on 10/26/2022)   [DISCONTINUED] VITAMIN D PO Take 2,000 Units by mouth daily.   No facility-administered encounter medications on file as of 05/23/2023.    Allergies (verified) Codeine   History: Past Medical History:  Diagnosis Date   Allergic rhinitis, cause unspecified    Anemia, unspecified    Carpal tunnel syndrome     Cystitis, unspecified    Family history of osteoporosis    PONV (postoperative nausea and vomiting)    Postnasal drip    Syncope and collapse    Past Surgical History:  Procedure Laterality Date   BONE MARROW BIOPSY     CARPAL TUNNEL RELEASE     COLONOSCOPY  09/28/2003   COLONOSCOPY WITH PROPOFOL N/A 07/31/2015   Procedure: COLONOSCOPY WITH PROPOFOL;  Surgeon: Bernette Redbird, MD;  Location: [facility];  Service: Endoscopy;  Laterality: N/A;   DIAGNOSTIC LAPAROSCOPY     dx lack of pregnancy    EYE SURGERY     lasik, cataract, prk,yag procedure   Family History  Problem Relation Age of Onset   Osteoporosis Mother    Stroke Mother    Coronary artery disease Father    Stroke Father 53   Diabetes Other        Aunts and uncles   Breast cancer Paternal Aunt    Breast cancer Maternal Aunt    Arthritis/Rheumatoid Child    Breast cancer Cousin    Social History   Socioeconomic History   Marital status: Married    Spouse name: Not on file   Number of children: Not on file   Years of education: Not on file   Highest education level: Not on file  Occupational History   Not on file  Tobacco Use   Smoking status: Never   Smokeless tobacco: Never  Vaping Use   Vaping status: Never Used  Substance and Sexual Activity   Alcohol use: Yes    Alcohol/week: 0.0 standard drinks of alcohol    Comment: Rare   Drug use: No   Sexual activity: Not Currently  Other Topics Concern   Not on file  Social History Narrative   No regular exercise      Drinks lots of [employer]eps[employer]         Social Determinants of Health   Financial Resource Strain: Low Risk  (05/23/2023)   Overall Financial Resource Strain (CARDIA)    Difficulty of Paying Living Expenses: Not hard at all  Food Insecurity: No Food Insecurity (05/23/2023)   Hunger Vital Sign    Worried About Running Out of Food in the Last Year: Never true    Ran Out of Food in the Last Year: Never true  Transportation Needs: No Transportation  Needs (05/23/2023)   PRAPARE - Administrator, Civil Service (Medical): No    Lack of Transportation (Non-Medical): No  Physical Activity: Inactive (05/23/2023)   Exercise Vital Sign    Days of Exercise per Week: 0 days    Minutes of Exercise per Session: 0 min  Stress: No Stress Concern Present (05/23/2023)   [employer] of Occupational Health - Occupational Stress Questionnaire    Feeling of Stress : Not at all  Social Connections: Moderately Integrated (05/23/2023)   Social Connection and Isolation Panel [NHANES]    Frequency of Communication with Friends and Family: More than three times a week    Frequency  of Social Gatherings with Friends and Family: More than three times a week    Attends Religious Services: More than 4 times per year    Active Member of [employer] or Organizations: No    Attends Engineer, structural: Never    Marital Status: Married    Tobacco Counseling Counseling given: Not Answered   Clinical Intake:  Pre-visit preparation completed: No  Pain : No/denies pain     BMI - recorded: 24.82 Nutritional Status: BMI of 19-24  Normal Nutritional Risks: None Diabetes: No  How often do you need to have someone help you when you read instructions, pamphlets, or other written materials from your doctor or pharmacy?: 1 - Never  Interpreter Needed?: No  Information entered by :: C.Jamieka Royle LPN   Activities of Daily Living    05/23/2023    9:26 AM  In your present state of health, do you have any difficulty performing the following activities:  Hearing? 0  Vision? 0  Difficulty concentrating or making decisions? 1  Comment occasionall has trouble remembering  Walking or climbing stairs? 1  Comment due to joint pain  Dressing or bathing? 0  Doing errands, shopping? 0  Preparing Food and eating ? N  Using the Toilet? N  In the past six months, have you accidently leaked urine? Y  Comment occasionally  Do you have problems with  loss of bowel control? N  Managing your Medications? N  Managing your Finances? N  Housekeeping or managing your Housekeeping? N    Patient Care Team: [name], Audrie Gallus, MD as PCP - General Donneta Romberg Worthy Flank, MD as Consulting Physician (Hematology and Oncology)  Indicate any recent Medical Services you [place] have received from other than [facility] providers in the past year (date [place] be approximate).     Assessment:   This is a routine wellness examination for [place].  Hearing/Vision screen Hearing Screening - Comments:: Denies hearing difficulties   Vision Screening - Comments:: Readers - [facility] - UTD on exams  Dietary issues and exercise activities discussed:     Goals Addressed             This Visit's Progress    Patient Stated       Control HGB       Depression Screen    05/23/2023    9:22 AM 05/20/2022    9:30 AM 05/07/2022   10:59 AM 04/29/2022   11:15 AM 04/27/2021   10:33 AM 03/06/2020    2:53 PM 03/02/2019    1:08 PM  PHQ 2/9 Scores  PHQ - 2 Score 0 0 0 0 0 0 0  PHQ- 9 Score  0    0 0    Fall Risk    05/23/2023    9:26 AM 05/20/2022    9:32 AM 04/29/2022   11:15 AM 04/27/2021   10:33 AM 03/06/2020    2:53 PM  Fall Risk   Falls in the past year? 0 0 0 0 0  Number falls in past yr: 0 0   0  Injury with Fall? 0 0   0  Risk for fall due to : No Fall Risks No Fall Risks   No Fall Risks  Follow up Falls prevention discussed;Falls evaluation completed Falls evaluation completed Falls evaluation completed  Falls evaluation completed;Falls prevention discussed    MEDICARE RISK AT HOME: Medicare Risk at Home Any stairs in or around the home?: Yes If so, are there any without handrails?: No  Home free of loose throw rugs in walkways, pet beds, electrical cords, etc?: Yes Adequate lighting in your home to reduce risk of falls?: Yes Life alert?: No Use of a cane, walker or w/c?: No Grab bars in the bathroom?: Yes Shower chair or bench in shower?: Yes Elevated  toilet seat or a handicapped toilet?: No  TIMED UP AND GO:  Was the test performed?  No    Cognitive Function:    03/06/2020    2:55 PM 03/02/2019   12:30 PM 12/20/2017    9:17 AM  MMSE - Mini Mental State Exam  Orientation to time 5 5 5   Orientation to Place 5 5 5   Registration 3 3 3   Attention/ Calculation 5 0 0  Recall 3 3 3   Language- name 2 objects  0 0  Language- repeat 1 1 1   Language- follow 3 step command  0 3  Language- read & follow direction  0 0  Write a sentence  0 0  Copy design  0 0  Total score  17 20        05/23/2023    9:29 AM 05/20/2022    9:34 AM  6CIT Screen  What Year? 0 points 0 points  What month? 0 points 0 points  What time? 0 points 0 points  Count back from 20 0 points 0 points  Months in reverse 0 points 0 points  Repeat phrase 0 points 0 points  Total Score 0 points 0 points    Immunizations Immunization History  Administered Date(s) Administered   Fluad Quad(high Dose 65+) 07/05/2019, 06/24/2020, 07/07/2021   Influenza Split 06/22/2011   Influenza Whole 06/26/2008, 07/15/2009, 08/17/2012   Influenza, High Dose Seasonal PF 07/13/2015, 06/27/2017   Influenza,inj,Quad PF,6+ Mos 07/18/2013   Influenza-Unspecified 07/11/2014, 07/09/2016   PFIZER(Purple Top)SARS-COV-2 Vaccination 11/03/2019, 11/24/2019   Pneumococcal Conjugate-13 10/10/2015   Pneumococcal Polysaccharide-23 12/14/2016   Tdap 06/22/2011   Zoster Recombinant(Shingrix) 07/31/2019, 12/19/2019   Zoster, Live 07/05/2012    TDAP status: Due, Education has been provided regarding the importance of this vaccine. Advised [place] receive this vaccine at local pharmacy or Health Dept. Aware to provide a copy of the vaccination record if obtained from local pharmacy or Health Dept. Verbalized acceptance and understanding.  Flu Vaccine status: Due, Education has been provided regarding the importance of this vaccine. Advised [place] receive this vaccine at local pharmacy or Health Dept. Aware  to provide a copy of the vaccination record if obtained from local pharmacy or Health Dept. Verbalized acceptance and understanding.  Pneumococcal vaccine status: Up to date  Covid-19 vaccine status: Declined, Education has been provided regarding the importance of this vaccine but patient still declined. Advised [place] receive this vaccine at local pharmacy or Health Dept.or vaccine clinic. Aware to provide a copy of the vaccination record if obtained from local pharmacy or Health Dept. Verbalized acceptance and understanding.  Qualifies for Shingles Vaccine? Yes   Zostavax completed Yes   Shingrix Completed?: Yes  Screening Tests Health Maintenance  Topic Date Due   COVID-19 Vaccine (3 - Pfizer risk series) 12/22/2019   DTaP/Tdap/Td (2 - Td or Tdap) 06/21/2021   INFLUENZA VACCINE  04/28/2023   MAMMOGRAM  07/01/2023   Medicare Annual Wellness (AWV)  05/22/2024   Colonoscopy  07/30/2025   Pneumonia Vaccine 65+ Years old  Completed   DEXA SCAN  Completed   Hepatitis C Screening  Completed   Zoster Vaccines- Shingrix  Completed   HPV VACCINES  Aged Out  Health Maintenance  Health Maintenance Due  Topic Date Due   COVID-19 Vaccine (3 - Pfizer risk series) 12/22/2019   DTaP/Tdap/Td (2 - Td or Tdap) 06/21/2021   INFLUENZA VACCINE  04/28/2023    Colorectal cancer screening: Type of screening: Colonoscopy. Completed 07/31/15. Repeat every 10 years  Mammogram status: Completed 06/29/22. Repeat every year  Bone Density status: Completed 01/31/18. Results reflect: Bone density results: NORMAL. Repeat every 5 years.  Lung Cancer Screening: (Low Dose CT Chest recommended if Age 37-80 years, 20 pack-year currently smoking OR have quit w/in 15years.) does not qualify.   Lung Cancer Screening Referral:    Additional Screening:  Hepatitis C Screening: does qualify; Completed 12/14/16  Vision Screening: Recommended annual ophthalmology exams for early detection of glaucoma and other  disorders of the eye. Is the patient up to date with their annual eye exam?  Yes  Who is the provider or what is the name of the office in which the patient attends annual eye exams? [facility] If pt is not established with a provider, would they like to be referred to a provider to establish care? No .   Dental Screening: Recommended annual dental exams for proper oral hygiene  Diabetic Foot Exam:   Community Resource Referral / Chronic Care Management: CRR required this visit?  No   CCM required this visit?  No     Plan:     I have personally reviewed and noted the following in the patient's chart:   Medical and social history Use of alcohol, tobacco or illicit drugs  Current medications and supplements including opioid prescriptions. Patient is not currently taking opioid prescriptions. Functional ability and status Nutritional status Physical activity Advanced directives List of other physicians Hospitalizations, surgeries, and ER visits in previous 12 months Vitals Screenings to include cognitive, depression, and falls Referrals and appointments  In addition, I have reviewed and discussed with patient certain preventive protocols, quality metrics, and best practice recommendations. A written personalized care plan for preventive services as well as general preventive health recommendations were provided to patient.     Maryan Puls, LPN   05/25/5620   After Visit Summary: (MyChart) Due to this being a telephonic visit, the after visit summary with patients personalized plan was offered to patient via MyChart   Nurse Notes: none

## 2023-05-24 ENCOUNTER — Other Ambulatory Visit: Payer: Self-pay

## 2023-05-24 ENCOUNTER — Ambulatory Visit: Payer: PPO | Admitting: Internal Medicine

## 2023-05-24 ENCOUNTER — Other Ambulatory Visit: Payer: PPO

## 2023-05-24 ENCOUNTER — Ambulatory Visit: Payer: PPO

## 2023-05-25 ENCOUNTER — Inpatient Hospital Stay: Payer: PPO

## 2023-05-25 ENCOUNTER — Ambulatory Visit: Payer: PPO | Admitting: Internal Medicine

## 2023-05-25 ENCOUNTER — Encounter: Payer: Self-pay | Admitting: Internal Medicine

## 2023-05-25 ENCOUNTER — Inpatient Hospital Stay: Payer: PPO | Admitting: Internal Medicine

## 2023-05-25 ENCOUNTER — Ambulatory Visit: Payer: PPO

## 2023-05-25 ENCOUNTER — Other Ambulatory Visit: Payer: PPO

## 2023-05-25 VITALS — BP 127/71 | HR 76 | Temp 97.4°F | Ht 64.75 in | Wt 151.4 lb

## 2023-05-25 DIAGNOSIS — D46Z Other myelodysplastic syndromes: Secondary | ICD-10-CM | POA: Diagnosis not present

## 2023-05-25 DIAGNOSIS — D461 Refractory anemia with ring sideroblasts: Secondary | ICD-10-CM | POA: Diagnosis not present

## 2023-05-25 LAB — CMP (CANCER CENTER ONLY)
ALT: 15 U/L (ref 0–44)
AST: 21 U/L (ref 15–41)
Albumin: 4.3 g/dL (ref 3.5–5.0)
Alkaline Phosphatase: 46 U/L (ref 38–126)
Anion gap: 7 (ref 5–15)
BUN: 26 mg/dL — ABNORMAL HIGH (ref 8–23)
CO2: 23 mmol/L (ref 22–32)
Calcium: 9.1 mg/dL (ref 8.9–10.3)
Chloride: 106 mmol/L (ref 98–111)
Creatinine: 1.08 mg/dL — ABNORMAL HIGH (ref 0.44–1.00)
GFR, Estimated: 55 mL/min — ABNORMAL LOW (ref >60)
Glucose, Bld: 144 mg/dL — ABNORMAL HIGH (ref 70–99)
Potassium: 4.1 mmol/L (ref 3.5–5.1)
Sodium: 136 mmol/L (ref 135–145)
Total Bilirubin: 0.7 mg/dL (ref 0.3–1.2)
Total Protein: 8.1 g/dL (ref 6.5–8.1)

## 2023-05-25 LAB — CBC WITH DIFFERENTIAL (CANCER CENTER ONLY)
Abs Immature Granulocytes: 0.53 10*3/uL — ABNORMAL HIGH (ref 0.00–0.07)
Basophils Absolute: 0.1 10*3/uL (ref 0.0–0.1)
Basophils Relative: 1 %
Eosinophils Absolute: 0.1 10*3/uL (ref 0.0–0.5)
Eosinophils Relative: 1 %
HCT: 32.9 % — ABNORMAL LOW (ref 36.0–46.0)
Hemoglobin: 10.2 g/dL — ABNORMAL LOW (ref 12.0–15.0)
Immature Granulocytes: 4 %
Lymphocytes Relative: 17 %
Lymphs Abs: 2.4 10*3/uL (ref 0.7–4.0)
MCH: 29.8 pg (ref 26.0–34.0)
MCHC: 31 g/dL (ref 30.0–36.0)
MCV: 96.2 fL (ref 80.0–100.0)
Monocytes Absolute: 2.1 10*3/uL — ABNORMAL HIGH (ref 0.1–1.0)
Monocytes Relative: 15 %
Neutro Abs: 8.4 10*3/uL — ABNORMAL HIGH (ref 1.7–7.7)
Neutrophils Relative %: 62 %
Platelet Count: 221 10*3/uL (ref 150–400)
RBC: 3.42 MIL/uL — ABNORMAL LOW (ref 3.87–5.11)
RDW: 29.8 % — ABNORMAL HIGH (ref 11.5–15.5)
WBC Count: 13.6 10*3/uL — ABNORMAL HIGH (ref 4.0–10.5)
nRBC: 0.6 % — ABNORMAL HIGH (ref 0.0–0.2)

## 2023-05-25 LAB — LACTATE DEHYDROGENASE: LDH: 135 U/L (ref 98–192)

## 2023-05-25 MED ORDER — LUSPATERCEPT-AAMT 75 MG ~~LOC~~ SOLR
1.0000 mg/kg | Freq: Once | SUBCUTANEOUS | Status: AC
  Administered 2023-05-25: 65 mg via SUBCUTANEOUS
  Filled 2023-05-25: qty 1.3

## 2023-05-25 NOTE — Progress Notes (Signed)
C/o generalized joint pain 5/10. Cymbalta made her have nausea, so she stopped it.  Has dental cleaning tomorrow, needs to make sure labs are ok for that.  Can she get a handicap placard form filled out?  Occasional SOB.

## 2023-05-25 NOTE — Progress Notes (Signed)
[facility] CONSULT NOTE  Patient Care Team: [name], Audrie Gallus, MD as PCP - General Donneta Romberg, Worthy Flank, MD as Consulting Physician (Hematology and Oncology)  CHIEF COMPLAINTS/PURPOSE OF CONSULTATION: MDS   Oncology History Overview Note   # July 2020-myelodysplastic syndrome with ringed sideroblasts-Normal karyotype; no blasts [IPSS R-Very low risk ~median survival 8.3 years]; iron studies B12 folic acid myeloma panel normal; pyridoxine levels/copper/zinc-WNL. Erythropoietin levels-60. II OPINION at [facility] [Dr.DeCastro]  # [facility]/ NGS: TET2([id number])c.2524delT(p.Ser842GlnfsTer31) Exon 3 frame-shift SF3B1([id number])c.2098A>G(p.Lys700Glu) Exon 15 missense  DNMT3A([id number])c.2204A>G(p.Tyr735Cys) Exon 19 missense   # JAN 11th 2020- Aranesp/retacrit;  # July 30th, 2021- START REVLIMID 10 mg/day  [SF3B46mutation]  MARCH 2023- BONE MARROW, ASPIRATE, CLOT, CORE:  -  Hypercellular marrow involved by myelodysplastic syndrome with ring  sideroblasts (MDS-RS)  -  Polytypic plasmacytosis   # [facility] 2023-increase the frequency of Aranesp 400 mcg every 2 weeks; continue Revlimid.   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 ambulating independently.  Carla [name] 71 y.o.  female history of low-grade MDS anemia with ring  sideroblasts currently on Aranesp/Luspatercept is here for follow-up.    /o generalized joint pain 5/10. Cymbalta made her have nausea, so she stopped it.   Has dental cleaning tomorrow, needs to make sure labs are ok for that.   Can she get a handicap placard form filled out?   Occasional SOB  Patient denies any worsening shortness of breath or cough.  Denies any leg swelling.  C/o generalized joint pain x2 months getter worse.  However slightly improved on Claritin.  Patient currently awaiting appointment with rheumatology at [facility] for Joint pains.  Appetite is fairly good. Still taking Calcium+D with the prescription Vitamin D.   Review of Systems  Constitutional:  Positive for malaise/fatigue. Negative for chills, diaphoresis and fever.  HENT:  Negative for nosebleeds and sore throat.   Eyes:  Negative for double vision.  Respiratory:  Negative for cough, hemoptysis, sputum production, shortness of breath and wheezing.   Cardiovascular:  Negative for chest pain and orthopnea.  Gastrointestinal:  Negative for abdominal pain, blood in stool, constipation, diarrhea, heartburn, melena and vomiting.  Genitourinary:  Negative for dysuria, frequency and urgency.  Musculoskeletal:  Positive for back pain and joint pain.  Skin: Negative.  Negative for itching and rash.  Neurological:  Negative for dizziness, tingling, focal weakness, weakness and headaches.  Endo/Heme/Allergies:  Does not bruise/bleed easily.  Psychiatric/Behavioral:  Negative for depression. The patient is not nervous/anxious and does not have insomnia.     MEDICAL HISTORY:  Past Medical History:  Diagnosis Date   Allergic rhinitis, cause unspecified    Anemia, unspecified    Carpal tunnel syndrome    Cystitis, unspecified    Family history of osteoporosis    PONV (postoperative nausea and vomiting)    Postnasal drip  Syncope and collapse     SURGICAL HISTORY: Past Surgical History:  Procedure Laterality Date    BONE MARROW BIOPSY     CARPAL TUNNEL RELEASE     COLONOSCOPY  09/28/2003   COLONOSCOPY WITH PROPOFOL N/A 07/31/2015   Procedure: COLONOSCOPY WITH PROPOFOL;  Surgeon: Bernette Redbird, MD;  Location: [facility];  Service: Endoscopy;  Laterality: N/A;   DIAGNOSTIC LAPAROSCOPY     dx lack of pregnancy    EYE SURGERY     lasik, cataract, prk,yag procedure    SOCIAL HISTORY: Social History   Socioeconomic History   Marital status: Married    Spouse name: Not on file   Number of children: Not on file   Years of education: Not on file   Highest education level: Not on file  Occupational History   Not on file  Tobacco Use   Smoking status: Never   Smokeless tobacco: Never  Vaping Use   Vaping status: Never Used  Substance and Sexual Activity   Alcohol use: Yes    Alcohol/week: 0.0 standard drinks of alcohol    Comment: Rare   Drug use: No   Sexual activity: Not Currently  Other Topics Concern   Not on file  Social History Narrative   No regular exercise      Drinks lots of Pepsi         Social Determinants of Health   Financial Resource Strain: Low Risk  (05/23/2023)   Overall Financial Resource Strain (CARDIA)    Difficulty of Paying Living Expenses: Not hard at all  Food Insecurity: No Food Insecurity (05/23/2023)   Hunger Vital Sign    Worried About Running Out of Food in the Last Year: Never true    Ran Out of Food in the Last Year: Never true  Transportation Needs: No Transportation Needs (05/23/2023)   PRAPARE - Administrator, Civil Service (Medical): No    Lack of Transportation (Non-Medical): No  Physical Activity: Inactive (05/23/2023)   Exercise Vital Sign    Days of Exercise per Week: 0 days    Minutes of Exercise per Session: 0 min  Stress: No Stress Concern Present (05/23/2023)   [employer] of Occupational Health - Occupational Stress Questionnaire    Feeling of Stress : Not at all  Social Connections: Moderately Integrated  (05/23/2023)   Social Connection and Isolation Panel [NHANES]    Frequency of Communication with Friends and Family: More than three times a week    Frequency of Social Gatherings with Friends and Family: More than three times a week    Attends Religious Services: More than 4 times per year    Active Member of [employer] or Organizations: No    Attends Banker Meetings: Never    Marital Status: Married  Catering manager Violence: Not At Risk (05/23/2023)   Humiliation, Afraid, Rape, and Kick questionnaire    Fear of Current or Ex-Partner: No    Emotionally Abused: No    Physically Abused: No    Sexually Abused: No    FAMILY HISTORY: Family History  Problem Relation Age of Onset   Osteoporosis Mother    Stroke Mother    Coronary artery disease Father    Stroke Father 78   Diabetes Other        Aunts and uncles   Breast cancer Paternal Aunt    Breast cancer Maternal Aunt    Arthritis/Rheumatoid Child    Breast cancer Cousin  ALLERGIES:  is allergic to codeine.  MEDICATIONS:  Current Outpatient Medications  Medication Sig Dispense Refill   acetaminophen (TYLENOL) 500 MG tablet Take 500-1,000 mg by mouth every 6 (six) hours as needed for mild pain or headache.     aspirin EC 81 MG tablet Take 81 mg by mouth at bedtime. Swallow whole.      Biotin 10 MG CAPS Take by mouth.     calcium-vitamin D (OSCAL WITH D) 500-5 MG-MCG tablet Take 1 tablet by mouth daily with breakfast.     Darbepoetin Alfa (ARANESP) 500 MCG/ML SOSY injection Inject 500 mcg into the skin See admin instructions. Inject 500 mcg into the skin every week     levothyroxine (SYNTHROID) 25 MCG tablet TAKE 1 TABLET BY MOUTH ONCE A DAY BEFOREBREAKFAST. 90 tablet 3   loratadine (CLARITIN) 10 MG tablet Take 10 mg by mouth daily.     luspatercept-aamt (REBLOZYL) 75 MG subcutaneous injection Inject into the skin.     ondansetron (ZOFRAN ODT) 8 MG disintegrating tablet Take 1 tablet (8 mg total) by mouth every 8  (eight) hours as needed for nausea or vomiting. 30 tablet 0   Vitamin D, Ergocalciferol, (DRISDOL) 1.25 MG (50000 UNIT) CAPS capsule TAKE ONE CAPSULE BY MOUTH ONCE A WEEK 12 capsule 1   clobetasol (TEMOVATE) 0.05 % external solution Apply topically. (Patient not taking: Reported on 05/23/2023)     desonide (DESOWEN) 0.05 % cream Apply topically. (Patient not taking: Reported on 05/23/2023)     No current facility-administered medications for this visit.      PHYSICAL EXAMINATION:   Vitals:   05/25/23 0847  BP: 127/71  Pulse: 76  Temp: (!) 97.4 F (36.3 C)  SpO2: 98%     Filed Weights   05/25/23 0847  Weight: 151 lb 6.4 oz (68.7 kg)     Physical Exam HENT:     Head: Normocephalic and atraumatic.     Mouth/Throat:     Pharynx: No oropharyngeal exudate.  Eyes:     Pupils: Pupils are equal, round, and reactive to light.  Cardiovascular:     Rate and Rhythm: Normal rate and regular rhythm.     Pulses: Normal pulses.     Heart sounds: Normal heart sounds.  Pulmonary:     Effort: Pulmonary effort is normal. No respiratory distress.     Breath sounds: Normal breath sounds. No wheezing.  Abdominal:     General: Bowel sounds are normal. There is no distension.     Palpations: Abdomen is soft. There is no mass.     Tenderness: There is no abdominal tenderness. There is no guarding or rebound.  Musculoskeletal:        General: No tenderness. Normal range of motion.     Cervical back: Normal range of motion and neck supple.  Skin:    General: Skin is warm.  Neurological:     Mental Status: She is alert and oriented to person, place, and time.  Psychiatric:        Mood and Affect: Affect normal.     LABORATORY DATA:  I have reviewed the data as listed Lab Results  Component Value Date   WBC 13.6 (H) 05/25/2023   HGB 10.2 (L) 05/25/2023   HCT 32.9 (L) 05/25/2023   MCV 96.2 05/25/2023   PLT 221 05/25/2023   Recent Labs    03/22/23 1029 04/12/23 1455  05/25/23 0849  NA 137 135 136  K 4.2 4.1 4.1  CL 103  103 106  CO2 24 24 23   GLUCOSE 120* 97 144*  BUN 22 23 26*  CREATININE 1.07* 1.16* 1.08*  CALCIUM 9.3 8.9 9.1  GFRNONAA 55* 50* 55*  PROT 8.2* 7.9 8.1  ALBUMIN 4.5 4.2 4.3  AST 21 20 21   ALT 15 16 15   ALKPHOS 44 41 46  BILITOT 1.0 0.5 0.7     No results found.  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 POSITIVE  For SF3B1; R-IPSS-low risk. MARCH 2023-bone marrow biopsy no evidence of obvious progression of MDS or any acute process.  Currently on Aranesp 100 mcg every 2-3 week plus Luspatercept.   # proceed with Luspatercept # 19  today  Continue with Aranesp prn for Hb < 10. Today- Hb 10.5-overall stable.  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 mprovement with claritin. S/p  appt with [facility] rheumatology; OFF cymblata [constipation]/nausea; awaiting on PT/water aerobics- stable.    # vit D def- FEB 2024-Vitamin D- 25/low-  on Vit D 50-continue vitamin D weekly. JUNE 2024-  Vit D-40- stable.  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  ? Etiology- poor tolerance to cymablta. refer to Dr.Vaslow.   # CKD- stage III- 50s- monitor for now-stable.  # Vaccination: s/p flu shot. Consider/recommend COVID/ RSV-   # handicapped parking.   # DISPOSITION:  # Peripheral neuropathy- refer to Dr.Vaslow. # HOLD Aranesp today # today Luspatecept  # follow up in 3 weeks--MD;  labs CBC/cmp; Vit D 25-OH;  LDH;  possible aranesp; Luspatercept SQ-  Dr.B   All questions were answered. The patient knows to call the clinic with any problems, questions or concerns.   Earna Coder, MD 05/25/2023 10:22 AM

## 2023-05-25 NOTE — Assessment & Plan Note (Addendum)
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 MARCH 2023-bone marrow biopsy no evidence of obvious progression of MDS or any acute process.  Currently on Aranesp 100 mcg every 2-3 week plus Luspatercept.   # proceed with Luspatercept # 19  today  Continue with Aranesp prn for Hb < 10. Today- Hb 10.5-overall stable.  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 mprovement with claritin. S/p  appt with [facility] rheumatology; OFF cymblata [constipation]/nausea; awaiting on PT/water aerobics- stable.    # vit D def- FEB 2024-Vitamin D- 25/low-  on Vit D 50-continue vitamin D weekly. JUNE 2024-  Vit D-40- stable.  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  ? Etiology- poor tolerance to cymablta. refer to Dr.Vaslow.   # CKD- stage III- 50s- monitor for now-stable.  # Vaccination: s/p flu shot. Consider/recommend COVID/ RSV-   # handicapped parking.   # DISPOSITION:  # Peripheral neuropathy- refer to Dr.Vaslow. # HOLD Aranesp today # today Luspatecept  # follow up in 3 weeks--MD;  labs CBC/cmp; Vit D 25-OH;  LDH;  possible aranesp; Luspatercept SQ-  Dr.B

## 2023-05-27 ENCOUNTER — Encounter: Payer: Self-pay | Admitting: Internal Medicine

## 2023-05-27 ENCOUNTER — Inpatient Hospital Stay (HOSPITAL_BASED_OUTPATIENT_CLINIC_OR_DEPARTMENT_OTHER): Payer: PPO | Admitting: Internal Medicine

## 2023-05-27 VITALS — BP 140/77 | HR 73 | Temp 97.1°F | Wt 151.0 lb

## 2023-05-27 DIAGNOSIS — G629 Polyneuropathy, unspecified: Secondary | ICD-10-CM

## 2023-05-27 DIAGNOSIS — D461 Refractory anemia with ring sideroblasts: Secondary | ICD-10-CM | POA: Diagnosis not present

## 2023-05-27 NOTE — Progress Notes (Signed)
[facility] [address]  [place], [place] [address] [phone]   New Patient Evaluation  Date of Service: 05/27/23 Patient Name: Carla [name] Patient MRN: [id number] Patient DOB: Quiroa 11, 1952 Provider: Henreitta Leber, MD  Identifying Statement:  Carla [name] is a 72 y.o. female with Neuropathy who presents for initial consultation and evaluation regarding cancer associated neurologic deficits.    Referring Provider: Judy Pimple, MD [address] [place],  [place] [address]  Primary Cancer:  Oncologic History: Oncology History Overview Note   # July 2020-myelodysplastic syndrome with ringed sideroblasts-Normal karyotype; no blasts [IPSS R-Very low risk ~median survival 8.3 years]; iron studies B12 folic acid myeloma panel normal; pyridoxine levels/copper/zinc-WNL. Erythropoietin levels-60. II OPINION at [facility] [Dr.DeCastro]  # [facility]/ NGS: TET2([id number])c.2524delT(p.Ser842GlnfsTer31) Exon 3 frame-shift SF3B1([id number])c.2098A>G(p.Lys700Glu) Exon 15 missense  DNMT3A([id number])c.2204A>G(p.Tyr735Cys) Exon 19 missense   # JAN 11th 2020- Aranesp/retacrit;  # July 30th, 2021- START REVLIMID 10 mg/day  [SF3B71mutation]  MARCH 2023- BONE MARROW, ASPIRATE, CLOT, CORE:  -  Hypercellular marrow involved by myelodysplastic syndrome with ring  sideroblasts (MDS-RS)  -  Polytypic plasmacytosis   # [facility] 2023-increase the frequency of Aranesp 400 mcg every 2 weeks; continue Revlimid.   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; September 2021 ejection fraction 60 to 65%;   # colonoscopy- 2016 [Dr.Bucinni];  DIAGNOSIS: MDS/low-grade with ringed sideroblasts  STAGE: Low       ;  GOALS: Control  CURRENT/MOST RECENT THERAPY : EPO agent+ REV    MDS (myelodysplastic syndrome), low grade (HCC)  04/23/2019 Initial Diagnosis   MDS (myelodysplastic syndrome), low grade  (HCC)   05/11/2022 - 05/11/2022 Chemotherapy   Patient is on Treatment Plan : MYELODYSPLASIA Luspatercept q21d     05/11/2022 -  Chemotherapy   Patient is on Treatment Plan : MYELODYSPLASIA Luspatercept q21d       History of Present Illness: The patient's records from the referring physician were obtained and reviewed and the patient interviewed to confirm this HPI.  Carla [name]  Medications: Current Outpatient Medications on File Prior to Visit  Medication Sig Dispense Refill   acetaminophen (TYLENOL) 500 MG tablet Take 500-1,000 mg by mouth every 6 (six) hours as needed for mild pain or headache.     aspirin EC 81 MG tablet Take 81 mg by mouth at bedtime. Swallow whole.      Biotin 10 MG CAPS Take by mouth.     calcium-vitamin D (OSCAL WITH D) 500-5 MG-MCG tablet Take 1 tablet by mouth daily with breakfast.     Darbepoetin Alfa (ARANESP) 500 MCG/ML SOSY injection Inject 500 mcg into the skin See admin instructions. Inject 500 mcg into the skin every week     levothyroxine (SYNTHROID) 25 MCG tablet TAKE 1 TABLET BY MOUTH ONCE A DAY BEFOREBREAKFAST. 90 tablet 3   loratadine (CLARITIN) 10 MG tablet Take 10 mg by mouth daily.     luspatercept-aamt (REBLOZYL) 75 MG subcutaneous injection Inject into the skin.     ondansetron (ZOFRAN ODT) 8 MG disintegrating tablet Take 1 tablet (8 mg total) by mouth every 8 (eight) hours as needed for nausea or vomiting. 30 tablet 0   Vitamin D, Ergocalciferol, (DRISDOL) 1.25 MG (50000 UNIT) CAPS capsule TAKE ONE CAPSULE BY MOUTH ONCE A WEEK 12 capsule 1   clobetasol (TEMOVATE) 0.05 % external solution Apply topically. (Patient not taking: Reported on 05/23/2023)  desonide (DESOWEN) 0.05 % cream Apply topically. (Patient not taking: Reported on 05/23/2023)     No current facility-administered medications on file prior to visit.    Allergies:  Allergies  Allergen Reactions   Codeine Nausea Only   Past Medical History:  Past Medical History:   Diagnosis Date   Allergic rhinitis, cause unspecified    Anemia, unspecified    Carpal tunnel syndrome    Cystitis, unspecified    Family history of osteoporosis    PONV (postoperative nausea and vomiting)    Postnasal drip    Syncope and collapse    Past Surgical History:  Past Surgical History:  Procedure Laterality Date   BONE MARROW BIOPSY     CARPAL TUNNEL RELEASE     COLONOSCOPY  09/28/2003   COLONOSCOPY WITH PROPOFOL N/A 07/31/2015   Procedure: COLONOSCOPY WITH PROPOFOL;  Surgeon: Bernette Redbird, MD;  Location: [facility];  Service: Endoscopy;  Laterality: N/A;   DIAGNOSTIC LAPAROSCOPY     dx lack of pregnancy    EYE SURGERY     lasik, cataract, prk,yag procedure   Social History:  Social History   Socioeconomic History   Marital status: Married    Spouse name: Not on file   Number of children: Not on file   Years of education: Not on file   Highest education level: Not on file  Occupational History   Not on file  Tobacco Use   Smoking status: Never   Smokeless tobacco: Never  Vaping Use   Vaping status: Never Used  Substance and Sexual Activity   Alcohol use: Yes    Alcohol/week: 0.0 standard drinks of alcohol    Comment: Rare   Drug use: No   Sexual activity: Not Currently  Other Topics Concern   Not on file  Social History Narrative   No regular exercise      Drinks lots of [employer]         Social Determinants of Health   Financial Resource Strain: Low Risk  (05/23/2023)   Overall Financial Resource Strain (CARDIA)    Difficulty of Paying Living Expenses: Not hard at all  Food Insecurity: No Food Insecurity (05/23/2023)   Hunger Vital Sign    Worried About Running Out of Food in the Last Year: Never true    Ran Out of Food in the Last Year: Never true  Transportation Needs: No Transportation Needs (05/23/2023)   PRAPARE - Administrator, Civil Service (Medical): No    Lack of Transportation (Non-Medical): No  Physical Activity:  Inactive (05/23/2023)   Exercise Vital Sign    Days of Exercise per Week: 0 days    Minutes of Exercise per Session: 0 min  Stress: No Stress Concern Present (05/23/2023)   [employer] of Occupational Health - Occupational Stress Questionnaire    Feeling of Stress : Not at all  Social Connections: Moderately Integrated (05/23/2023)   Social Connection and Isolation Panel [NHANES]    Frequency of Communication with Friends and Family: More than three times a week    Frequency of Social Gatherings with Friends and Family: More than three times a week    Attends Religious Services: More than 4 times per year    Active Member of [employer] or Organizations: No    Attends Banker Meetings: Never    Marital Status: Married  Catering manager Violence: Not At Risk (05/23/2023)   Humiliation, Afraid, Rape, and Kick questionnaire    Fear of Current  or Ex-Partner: No    Emotionally Abused: No    Physically Abused: No    Sexually Abused: No   Family History:  Family History  Problem Relation Age of Onset   Osteoporosis Mother    Stroke Mother    Coronary artery disease Father    Stroke Father 54   Diabetes Other        Aunts and uncles   Breast cancer Paternal Aunt    Breast cancer Maternal Aunt    Arthritis/Rheumatoid Child    Breast cancer Cousin     Review of Systems: Constitutional: Doesn't report fevers, chills or abnormal weight loss Eyes: Doesn't report blurriness of vision Ears, nose, mouth, throat, and face: Doesn't report sore throat Respiratory: Doesn't report cough, dyspnea or wheezes Cardiovascular: Doesn't report palpitation, chest discomfort  Gastrointestinal:  Doesn't report nausea, constipation, diarrhea GU: Doesn't report incontinence Skin: Doesn't report skin rashes Neurological: Per HPI Musculoskeletal: Doesn't report joint pain Behavioral/Psych: Doesn't report anxiety  Physical Exam: Vitals:   05/27/23 1009  BP: (!) 140/77  Pulse: 73  Temp:  (!) 97.1 F (36.2 C)   KPS: 80. General: Alert, cooperative, pleasant, in no acute distress Head: Normal EENT: No conjunctival injection or scleral icterus.  Lungs: Resp effort normal Cardiac: Regular rate Abdomen: Non-distended abdomen Skin: No rashes cyanosis or petechiae. Extremities: No clubbing or edema  Neurologic Exam: Mental Status: Awake, alert, attentive to examiner. Oriented to self and environment. Language is fluent with intact comprehension.  Cranial Nerves: Visual acuity is grossly normal. Visual fields are full. Extra-ocular movements intact. No ptosis. Face is symmetric Motor: Tone and bulk are normal. Power is full in both arms and legs. Reflexes are symmetric, no pathologic reflexes present.  Sensory: Intact to light touch Gait: Normal.   Labs: I have reviewed the data as listed    Component Value Date/Time   NA 136 05/25/2023 0849   K 4.1 05/25/2023 0849   CL 106 05/25/2023 0849   CO2 23 05/25/2023 0849   GLUCOSE 144 (H) 05/25/2023 0849   BUN 26 (H) 05/25/2023 0849   CREATININE 1.08 (H) 05/25/2023 0849   CALCIUM 9.1 05/25/2023 0849   PROT 8.1 05/25/2023 0849   ALBUMIN 4.3 05/25/2023 0849   AST 21 05/25/2023 0849   ALT 15 05/25/2023 0849   ALKPHOS 46 05/25/2023 0849   BILITOT 0.7 05/25/2023 0849   GFRNONAA 55 (L) 05/25/2023 0849   GFRAA >60 06/24/2020 1504   Lab Results  Component Value Date   WBC 13.6 (H) 05/25/2023   NEUTROABS 8.4 (H) 05/25/2023   HGB 10.2 (L) 05/25/2023   HCT 32.9 (L) 05/25/2023   MCV 96.2 05/25/2023   PLT 221 05/25/2023     Assessment/Plan Neuropathy  Carla [name] presents with clinical syndrome consistent with symmetric, length dependent, small and large fiber peripheral neuropathy.  Etiology is unclear, Sassaman be related to MDS or exposure to anti-neoplastic therapy.  Neuropathy labs are reviewed and normal with exception of Vit. B12.  This can be added on to her next lab draw.  Symptoms are mild overall and do not lead  to functional impairment.   We reviewed pathophysiology of chemotherapy induced neuropathy, available treatments, and goals of care.  We spent twenty additional minutes teaching regarding the natural history, biology, and historical experience in the treatment of neurologic complications of cancer.   We appreciate the opportunity to participate in the care of Carla [name].  She [place] follow up as needed.  All questions  were answered. The patient knows to call the clinic with any problems, questions or concerns. No barriers to learning were detected.  The total time spent in the encounter was 40 minutes and more than 50% was on counseling and review of test results   Henreitta Leber, MD Medical Director of Neuro-Oncology [facility] 05/27/23 11:07 AM

## 2023-05-27 NOTE — Progress Notes (Signed)
Patient experiencing numbness in her toes for some time now. She denies any balance issues or falls related to her numbness.

## 2023-06-15 ENCOUNTER — Encounter: Payer: Self-pay | Admitting: Internal Medicine

## 2023-06-15 ENCOUNTER — Ambulatory Visit: Payer: PPO | Admitting: Internal Medicine

## 2023-06-15 ENCOUNTER — Inpatient Hospital Stay: Payer: PPO

## 2023-06-15 ENCOUNTER — Ambulatory Visit: Payer: PPO

## 2023-06-15 ENCOUNTER — Other Ambulatory Visit: Payer: PPO

## 2023-06-15 ENCOUNTER — Inpatient Hospital Stay (HOSPITAL_BASED_OUTPATIENT_CLINIC_OR_DEPARTMENT_OTHER): Payer: PPO | Admitting: Internal Medicine

## 2023-06-15 ENCOUNTER — Inpatient Hospital Stay: Payer: PPO | Attending: Internal Medicine

## 2023-06-15 VITALS — BP 127/55 | HR 72 | Temp 97.0°F | Ht 64.75 in | Wt 152.0 lb

## 2023-06-15 DIAGNOSIS — E559 Vitamin D deficiency, unspecified: Secondary | ICD-10-CM | POA: Insufficient documentation

## 2023-06-15 DIAGNOSIS — D461 Refractory anemia with ring sideroblasts: Secondary | ICD-10-CM | POA: Insufficient documentation

## 2023-06-15 DIAGNOSIS — D46Z Other myelodysplastic syndromes: Secondary | ICD-10-CM

## 2023-06-15 DIAGNOSIS — N183 Chronic kidney disease, stage 3 unspecified: Secondary | ICD-10-CM | POA: Diagnosis not present

## 2023-06-15 DIAGNOSIS — M159 Polyosteoarthritis, unspecified: Secondary | ICD-10-CM | POA: Diagnosis not present

## 2023-06-15 DIAGNOSIS — M47816 Spondylosis without myelopathy or radiculopathy, lumbar region: Secondary | ICD-10-CM | POA: Diagnosis not present

## 2023-06-15 LAB — CBC WITH DIFFERENTIAL/PLATELET
Abs Immature Granulocytes: 0.81 10*3/uL — ABNORMAL HIGH (ref 0.00–0.07)
Basophils Absolute: 0.1 10*3/uL (ref 0.0–0.1)
Basophils Relative: 1 %
Eosinophils Absolute: 0.1 10*3/uL (ref 0.0–0.5)
Eosinophils Relative: 1 %
HCT: 31.6 % — ABNORMAL LOW (ref 36.0–46.0)
Hemoglobin: 10 g/dL — ABNORMAL LOW (ref 12.0–15.0)
Immature Granulocytes: 6 %
Lymphocytes Relative: 18 %
Lymphs Abs: 2.3 10*3/uL (ref 0.7–4.0)
MCH: 30.5 pg (ref 26.0–34.0)
MCHC: 31.6 g/dL (ref 30.0–36.0)
MCV: 96.3 fL (ref 80.0–100.0)
Monocytes Absolute: 1.9 10*3/uL — ABNORMAL HIGH (ref 0.1–1.0)
Monocytes Relative: 15 %
Neutro Abs: 7.8 10*3/uL — ABNORMAL HIGH (ref 1.7–7.7)
Neutrophils Relative %: 59 %
Platelets: 263 10*3/uL (ref 150–400)
RBC: 3.28 MIL/uL — ABNORMAL LOW (ref 3.87–5.11)
RDW: 30.4 % — ABNORMAL HIGH (ref 11.5–15.5)
Smear Review: ADEQUATE
WBC: 12.9 10*3/uL — ABNORMAL HIGH (ref 4.0–10.5)
nRBC: 1.2 % — ABNORMAL HIGH (ref 0.0–0.2)

## 2023-06-15 LAB — CMP (CANCER CENTER ONLY)
ALT: 15 U/L (ref 0–44)
AST: 22 U/L (ref 15–41)
Albumin: 4.3 g/dL (ref 3.5–5.0)
Alkaline Phosphatase: 42 U/L (ref 38–126)
Anion gap: 6 (ref 5–15)
BUN: 24 mg/dL — ABNORMAL HIGH (ref 8–23)
CO2: 24 mmol/L (ref 22–32)
Calcium: 9.1 mg/dL (ref 8.9–10.3)
Chloride: 106 mmol/L (ref 98–111)
Creatinine: 0.99 mg/dL (ref 0.44–1.00)
GFR, Estimated: >60 mL/min
Glucose, Bld: 109 mg/dL — ABNORMAL HIGH (ref 70–99)
Potassium: 4.1 mmol/L (ref 3.5–5.1)
Sodium: 136 mmol/L (ref 135–145)
Total Bilirubin: 0.7 mg/dL (ref 0.3–1.2)
Total Protein: 7.9 g/dL (ref 6.5–8.1)

## 2023-06-15 LAB — LACTATE DEHYDROGENASE: LDH: 152 U/L (ref 98–192)

## 2023-06-15 MED ORDER — LUSPATERCEPT-AAMT 75 MG ~~LOC~~ SOLR
1.0000 mg/kg | Freq: Once | SUBCUTANEOUS | Status: AC
  Administered 2023-06-15: 70 mg via SUBCUTANEOUS
  Filled 2023-06-15: qty 1.4

## 2023-06-15 NOTE — Progress Notes (Signed)
[facility] CONSULT NOTE  Patient Care Team: [name], Audrie Gallus, MD as PCP - General Donneta Romberg, Worthy Flank, MD as Consulting Physician (Hematology and Oncology)  CHIEF COMPLAINTS/PURPOSE OF CONSULTATION: MDS   Oncology History Overview Note   # July 2020-myelodysplastic syndrome with ringed sideroblasts-Normal karyotype; no blasts [IPSS R-Very low risk ~median survival 8.3 years]; iron studies B12 folic acid myeloma panel normal; pyridoxine levels/copper/zinc-WNL. Erythropoietin levels-60. II OPINION at [facility] [Dr.DeCastro]  # [facility]/ NGS: TET2([id number])c.2524delT(p.Ser842GlnfsTer31) Exon 3 frame-shift SF3B1([id number])c.2098A>G(p.Lys700Glu) Exon 15 missense  DNMT3A([id number])c.2204A>G(p.Tyr735Cys) Exon 19 missense   # JAN 11th 2020- Aranesp/retacrit;  # July 30th, 2021- START REVLIMID 10 mg/day  [SF3B82mutation]  MARCH 2023- BONE MARROW, ASPIRATE, CLOT, CORE:  -  Hypercellular marrow involved by myelodysplastic syndrome with ring  sideroblasts (MDS-RS)  -  Polytypic plasmacytosis   # MARCh 2023-increase the frequency of Aranesp 400 mcg every 2 weeks; continue Revlimid.   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; September 2021 ejection fraction 60 to 65%;   # colonoscopy- 2016 [Dr.Bucinni];  DIAGNOSIS: MDS/low-grade with ringed sideroblasts  STAGE: Low       ;  GOALS: Control  CURRENT/MOST RECENT THERAPY : EPO agent+ REV    MDS (myelodysplastic syndrome), low grade (HCC)  04/23/2019 Initial Diagnosis   MDS (myelodysplastic syndrome), low grade (HCC)   05/11/2022 - 05/11/2022 Chemotherapy   Patient is on Treatment Plan : MYELODYSPLASIA Luspatercept q21d     05/11/2022 -  Chemotherapy   Patient is on Treatment Plan : MYELODYSPLASIA Luspatercept q21d      HISTORY OF PRESENTING ILLNESS: Accompanied by husband.  Ambulating independently.  Carla [name] 72 y.o.  female history of low-grade MDS  anemia with ring sideroblasts currently on Aranesp/Luspatercept is here for follow-up.    In the interim evaluated by neurology for neuropathy.   Patient continues to have occasional SOB; otherwise no worsening cough or leg swelling.  Appetite is fairly good. Still taking Calcium+D with the prescription Vitamin D.   Review of Systems  Constitutional:  Positive for malaise/fatigue. Negative for chills, diaphoresis and fever.  HENT:  Negative for nosebleeds and sore throat.   Eyes:  Negative for double vision.  Respiratory:  Negative for cough, hemoptysis, sputum production, shortness of breath and wheezing.   Cardiovascular:  Negative for chest pain and orthopnea.  Gastrointestinal:  Negative for abdominal pain, blood in stool, constipation, diarrhea, heartburn, melena and vomiting.  Genitourinary:  Negative for dysuria, frequency and urgency.  Musculoskeletal:  Positive for back pain and joint pain.  Skin: Negative.  Negative for itching and rash.  Neurological:  Negative for dizziness, tingling, focal weakness, weakness and headaches.  Endo/Heme/Allergies:  Does not bruise/bleed easily.  Psychiatric/Behavioral:  Negative for depression. The patient is not nervous/anxious and does not have insomnia.     MEDICAL HISTORY:  Past Medical History:  Diagnosis Date   Allergic rhinitis, cause unspecified    Anemia, unspecified    Carpal tunnel syndrome    Cystitis, unspecified    Family history of osteoporosis    PONV (postoperative nausea and vomiting)    Postnasal drip    Syncope and collapse     SURGICAL HISTORY: Past Surgical History:  Procedure Laterality Date   BONE MARROW BIOPSY     CARPAL TUNNEL RELEASE     COLONOSCOPY  09/28/2003   COLONOSCOPY WITH PROPOFOL N/A 07/31/2015   Procedure: COLONOSCOPY WITH PROPOFOL;  Surgeon: Bernette Redbird, MD;  Location: [facility];  Service: Endoscopy;  Laterality: N/A;   DIAGNOSTIC LAPAROSCOPY     dx lack of pregnancy    EYE SURGERY      lasik, cataract, prk,yag procedure    SOCIAL HISTORY: Social History   Socioeconomic History   Marital status: Married    Spouse name: Not on file   Number of children: Not on file   Years of education: Not on file   Highest education level: Not on file  Occupational History   Not on file  Tobacco Use   Smoking status: Never   Smokeless tobacco: Never  Vaping Use   Vaping status: Never Used  Substance and Sexual Activity   Alcohol use: Yes    Alcohol/week: 0.0 standard drinks of alcohol    Comment: Rare   Drug use: No   Sexual activity: Not Currently  Other Topics Concern   Not on file  Social History Narrative   No regular exercise      Drinks lots of [employer]         Social Determinants of Health   Financial Resource Strain: Low Risk  (05/23/2023)   Overall Financial Resource Strain (CARDIA)    Difficulty of Paying Living Expenses: Not hard at all  Food Insecurity: No Food Insecurity (05/23/2023)   Hunger Vital Sign    Worried About Running Out of Food in the Last Year: Never true    Ran Out of Food in the Last Year: Never true  Transportation Needs: No Transportation Needs (05/23/2023)   PRAPARE - Administrator, Civil Service (Medical): No    Lack of Transportation (Non-Medical): No  Physical Activity: Inactive (05/23/2023)   Exercise Vital Sign    Days of Exercise per Week: 0 days    Minutes of Exercise per Session: 0 min  Stress: No Stress Concern Present (05/23/2023)   [employer] of Occupational Health - Occupational Stress Questionnaire    Feeling of Stress : Not at all  Social Connections: Moderately Integrated (05/23/2023)   Social Connection and Isolation Panel [NHANES]    Frequency of Communication with Friends and Family: More than three times a week    Frequency of Social Gatherings with Friends and Family: More than three times a week    Attends Religious Services: More than 4 times per year    Active Member of [employer] or  Organizations: No    Attends Banker Meetings: Never    Marital Status: Married  Catering manager Violence: Not At Risk (05/23/2023)   Humiliation, Afraid, Rape, and Kick questionnaire    Fear of Current or Ex-Partner: No    Emotionally Abused: No    Physically Abused: No    Sexually Abused: No    FAMILY HISTORY: Family History  Problem Relation Age of Onset   Osteoporosis Mother    Stroke Mother    Coronary artery disease Father    Stroke Father 4   Diabetes Other        Aunts and uncles   Breast cancer Paternal Aunt    Breast cancer Maternal Aunt    Arthritis/Rheumatoid Child    Breast cancer Cousin     ALLERGIES:  is allergic to codeine.  MEDICATIONS:  Current Outpatient Medications  Medication Sig Dispense Refill   acetaminophen (TYLENOL) 500 MG tablet Take 500-1,000 mg by mouth every 6 (six) hours as needed for mild pain or headache.     aspirin EC 81 MG tablet Take 81 mg by mouth at bedtime. Swallow whole.  Biotin 10 MG CAPS Take by mouth.     calcium-vitamin D (OSCAL WITH D) 500-5 MG-MCG tablet Take 1 tablet by mouth daily with breakfast.     Darbepoetin Alfa (ARANESP) 500 MCG/ML SOSY injection Inject 500 mcg into the skin See admin instructions. Inject 500 mcg into the skin every week     gabapentin (NEURONTIN) 100 MG capsule Take 1 capsule by mouth at bedtime.     levothyroxine (SYNTHROID) 25 MCG tablet TAKE 1 TABLET BY MOUTH ONCE A DAY BEFOREBREAKFAST. 90 tablet 3   loratadine (CLARITIN) 10 MG tablet Take 10 mg by mouth daily.     luspatercept-aamt (REBLOZYL) 75 MG subcutaneous injection Inject into the skin.     ondansetron (ZOFRAN ODT) 8 MG disintegrating tablet Take 1 tablet (8 mg total) by mouth every 8 (eight) hours as needed for nausea or vomiting. 30 tablet 0   Vitamin D, Ergocalciferol, (DRISDOL) 1.25 MG (50000 UNIT) CAPS capsule TAKE ONE CAPSULE BY MOUTH ONCE A WEEK 12 capsule 1   clobetasol (TEMOVATE) 0.05 % external solution Apply  topically. (Patient not taking: Reported on 05/23/2023)     desonide (DESOWEN) 0.05 % cream Apply topically. (Patient not taking: Reported on 05/23/2023)     No current facility-administered medications for this visit.      PHYSICAL EXAMINATION:   Vitals:   06/15/23 1246  BP: (!) 127/55  Pulse: 72  Temp: (!) 97 F (36.1 C)  SpO2: 98%     Filed Weights   06/15/23 1246  Weight: 152 lb (68.9 kg)     Physical Exam HENT:     Head: Normocephalic and atraumatic.     Mouth/Throat:     Pharynx: No oropharyngeal exudate.  Eyes:     Pupils: Pupils are equal, round, and reactive to light.  Cardiovascular:     Rate and Rhythm: Normal rate and regular rhythm.     Pulses: Normal pulses.     Heart sounds: Normal heart sounds.  Pulmonary:     Effort: Pulmonary effort is normal. No respiratory distress.     Breath sounds: Normal breath sounds. No wheezing.  Abdominal:     General: Bowel sounds are normal. There is no distension.     Palpations: Abdomen is soft. There is no mass.     Tenderness: There is no abdominal tenderness. There is no guarding or rebound.  Musculoskeletal:        General: No tenderness. Normal range of motion.     Cervical back: Normal range of motion and neck supple.  Skin:    General: Skin is warm.  Neurological:     Mental Status: She is alert and oriented to person, place, and time.  Psychiatric:        Mood and Affect: Affect normal.     LABORATORY DATA:  I have reviewed the data as listed Lab Results  Component Value Date   WBC 12.9 (H) 06/15/2023   HGB 10.0 (L) 06/15/2023   HCT 31.6 (L) 06/15/2023   MCV 96.3 06/15/2023   PLT 263 06/15/2023   Recent Labs    04/12/23 1455 05/25/23 0849 06/15/23 1233  NA 135 136 136  K 4.1 4.1 4.1  CL 103 106 106  CO2 24 23 24   GLUCOSE 97 144* 109*  BUN 23 26* 24*  CREATININE 1.16* 1.08* 0.99  CALCIUM 8.9 9.1 9.1  GFRNONAA 50* 55* >60  PROT 7.9 8.1 7.9  ALBUMIN 4.2 4.3 4.3  AST 20 21 22   ALT  16  15 15   ALKPHOS 41 46 42  BILITOT 0.5 0.7 0.7     No results found.  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 POSITIVE  For SF3B1; R-IPSS-low risk. MARCH 2023-bone marrow biopsy no evidence of obvious progression of MDS or any acute process.  Currently on Aranesp 100 mcg every 2-3 week plus Luspatercept.   # proceed with Luspatercept # 20  today  Continue with Aranesp prn for Hb < 10. Today- Hb 10. -overall stable.  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 mprovement with claritin. S/p  appt with [facility] rheumatology; OFF cymblata [constipation]/nausea; awaiting on PT/water aerobics- stable.    # vit D def- FEB 2024-Vitamin D- 25/low-  on Vit D 50-continue vitamin D weekly. JUNE 2024-  Vit D-40- stable.    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  ? Etiology- poor tolerance to cymablta. S/p evaluation with Dr.Vaslow.  Monitor for now  # CKD- stage III- 50s- monitor for now-stable.  # Vaccination: s/p flu shot. Consider/recommend COVID/ RSV-   # handicapped parking.   # DISPOSITION:  # HOLD Aranesp today # today Luspatecept  # follow up in 3 weeks--MD;  labs CBC/cmp; Vit D 25-OH;  LDH;  possible aranesp; Luspatercept SQ-  Dr.B    All questions were answered. The patient knows to call the clinic with any problems, questions or concerns.   Earna Coder, MD 06/15/2023 2:18 PM

## 2023-06-15 NOTE — Assessment & Plan Note (Addendum)
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 MARCH 2023-bone marrow biopsy no evidence of obvious progression of MDS or any acute process.  Currently on Aranesp 100 mcg every 2-3 week plus Luspatercept.   # proceed with Luspatercept # 20  today  Continue with Aranesp prn for Hb < 10. Today- Hb 10. -overall stable.  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 mprovement with claritin. S/p  appt with [facility] rheumatology; OFF cymblata [constipation]/nausea; awaiting on PT/water aerobics- stable.    # vit D def- FEB 2024-Vitamin D- 25/low-  on Vit D 50-continue vitamin D weekly. JUNE 2024-  Vit D-40- stable.    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  ? Etiology- poor tolerance to cymablta. S/p evaluation with Dr.Vaslow.  Monitor for now  # CKD- stage III- 50s- monitor for now-stable.  # Vaccination: s/p flu shot. Consider/recommend COVID/ RSV-   # handicapped parking.   # DISPOSITION:  # HOLD Aranesp today # today Luspatecept  # follow up in 3 weeks--MD;  labs CBC/cmp; Vit D 25-OH;  LDH;  possible aranesp; Luspatercept SQ-  Dr.B

## 2023-06-15 NOTE — Progress Notes (Signed)
No questions/concerns today.

## 2023-06-17 ENCOUNTER — Other Ambulatory Visit: Payer: Self-pay | Admitting: Family Medicine

## 2023-06-17 NOTE — Telephone Encounter (Signed)
Pt is overdue for her CPE (labs prior), please schedule and then route back to me. thanks

## 2023-06-20 DIAGNOSIS — Z961 Presence of intraocular lens: Secondary | ICD-10-CM | POA: Diagnosis not present

## 2023-06-21 NOTE — Telephone Encounter (Signed)
Patient has been scheduled

## 2023-06-22 LAB — VITAMIN D 25 HYDROXY (VIT D DEFICIENCY, FRACTURES)

## 2023-07-03 ENCOUNTER — Telehealth: Payer: Self-pay | Admitting: Family Medicine

## 2023-07-03 DIAGNOSIS — R944 Abnormal results of kidney function studies: Secondary | ICD-10-CM | POA: Insufficient documentation

## 2023-07-03 DIAGNOSIS — R739 Hyperglycemia, unspecified: Secondary | ICD-10-CM

## 2023-07-03 DIAGNOSIS — R7303 Prediabetes: Secondary | ICD-10-CM | POA: Insufficient documentation

## 2023-07-03 DIAGNOSIS — R7309 Other abnormal glucose: Secondary | ICD-10-CM | POA: Insufficient documentation

## 2023-07-03 DIAGNOSIS — D46Z Other myelodysplastic syndromes: Secondary | ICD-10-CM

## 2023-07-03 DIAGNOSIS — E559 Vitamin D deficiency, unspecified: Secondary | ICD-10-CM

## 2023-07-03 DIAGNOSIS — N1831 Chronic kidney disease, stage 3a: Secondary | ICD-10-CM | POA: Insufficient documentation

## 2023-07-03 DIAGNOSIS — D649 Anemia, unspecified: Secondary | ICD-10-CM

## 2023-07-03 DIAGNOSIS — E039 Hypothyroidism, unspecified: Secondary | ICD-10-CM

## 2023-07-03 NOTE — Telephone Encounter (Signed)
-----   Message from Alvina Chou sent at 06/21/2023  4:02 PM EDT ----- Regarding: Lab orders for [facility], 10.9.24 Patient is scheduled for CPX labs, please order future labs, Thanks , Camelia Eng

## 2023-07-05 ENCOUNTER — Inpatient Hospital Stay (HOSPITAL_BASED_OUTPATIENT_CLINIC_OR_DEPARTMENT_OTHER): Payer: PPO | Admitting: Internal Medicine

## 2023-07-05 ENCOUNTER — Inpatient Hospital Stay: Payer: PPO | Attending: Internal Medicine

## 2023-07-05 ENCOUNTER — Encounter: Payer: Self-pay | Admitting: Internal Medicine

## 2023-07-05 ENCOUNTER — Inpatient Hospital Stay: Payer: PPO

## 2023-07-05 VITALS — BP 127/57 | HR 73 | Temp 97.1°F | Ht 64.75 in | Wt 154.4 lb

## 2023-07-05 DIAGNOSIS — E039 Hypothyroidism, unspecified: Secondary | ICD-10-CM | POA: Insufficient documentation

## 2023-07-05 DIAGNOSIS — D461 Refractory anemia with ring sideroblasts: Secondary | ICD-10-CM | POA: Insufficient documentation

## 2023-07-05 DIAGNOSIS — L930 Discoid lupus erythematosus: Secondary | ICD-10-CM | POA: Diagnosis not present

## 2023-07-05 DIAGNOSIS — G629 Polyneuropathy, unspecified: Secondary | ICD-10-CM | POA: Diagnosis not present

## 2023-07-05 DIAGNOSIS — D46Z Other myelodysplastic syndromes: Secondary | ICD-10-CM

## 2023-07-05 DIAGNOSIS — E559 Vitamin D deficiency, unspecified: Secondary | ICD-10-CM | POA: Insufficient documentation

## 2023-07-05 DIAGNOSIS — N183 Chronic kidney disease, stage 3 unspecified: Secondary | ICD-10-CM | POA: Insufficient documentation

## 2023-07-05 LAB — CMP (CANCER CENTER ONLY)
ALT: 16 U/L (ref 0–44)
AST: 24 U/L (ref 15–41)
Albumin: 4.3 g/dL (ref 3.5–5.0)
Alkaline Phosphatase: 42 U/L (ref 38–126)
Anion gap: 9 (ref 5–15)
BUN: 26 mg/dL — ABNORMAL HIGH (ref 8–23)
CO2: 21 mmol/L — ABNORMAL LOW (ref 22–32)
Calcium: 9 mg/dL (ref 8.9–10.3)
Chloride: 104 mmol/L (ref 98–111)
Creatinine: 1.2 mg/dL — ABNORMAL HIGH (ref 0.44–1.00)
GFR, Estimated: 48 mL/min — ABNORMAL LOW (ref >60)
Glucose, Bld: 165 mg/dL — ABNORMAL HIGH (ref 70–99)
Potassium: 3.8 mmol/L (ref 3.5–5.1)
Sodium: 134 mmol/L — ABNORMAL LOW (ref 135–145)
Total Bilirubin: 0.6 mg/dL (ref 0.3–1.2)
Total Protein: 7.9 g/dL (ref 6.5–8.1)

## 2023-07-05 LAB — CBC WITH DIFFERENTIAL/PLATELET
Abs Immature Granulocytes: 1.15 10*3/uL — ABNORMAL HIGH (ref 0.00–0.07)
Basophils Absolute: 0.2 10*3/uL — ABNORMAL HIGH (ref 0.0–0.1)
Basophils Relative: 1 %
Eosinophils Absolute: 0.1 10*3/uL (ref 0.0–0.5)
Eosinophils Relative: 1 %
HCT: 31.5 % — ABNORMAL LOW (ref 36.0–46.0)
Hemoglobin: 9.9 g/dL — ABNORMAL LOW (ref 12.0–15.0)
Immature Granulocytes: 8 %
Lymphocytes Relative: 17 %
Lymphs Abs: 2.3 10*3/uL (ref 0.7–4.0)
MCH: 30.2 pg (ref 26.0–34.0)
MCHC: 31.4 g/dL (ref 30.0–36.0)
MCV: 96 fL (ref 80.0–100.0)
Monocytes Absolute: 1.7 10*3/uL — ABNORMAL HIGH (ref 0.1–1.0)
Monocytes Relative: 12 %
Neutro Abs: 8.4 10*3/uL — ABNORMAL HIGH (ref 1.7–7.7)
Neutrophils Relative %: 61 %
Platelets: 233 10*3/uL (ref 150–400)
RBC: 3.28 MIL/uL — ABNORMAL LOW (ref 3.87–5.11)
RDW: 30.3 % — ABNORMAL HIGH (ref 11.5–15.5)
Smear Review: ADEQUATE
WBC: 13.8 10*3/uL — ABNORMAL HIGH (ref 4.0–10.5)
nRBC: 1.1 % — ABNORMAL HIGH (ref 0.0–0.2)

## 2023-07-05 LAB — LACTATE DEHYDROGENASE: LDH: 142 U/L (ref 98–192)

## 2023-07-05 LAB — VITAMIN D 25 HYDROXY (VIT D DEFICIENCY, FRACTURES): Vit D, 25-Hydroxy: 36.56 ng/mL (ref 30–100)

## 2023-07-05 MED ORDER — DARBEPOETIN ALFA 500 MCG/ML IJ SOSY
500.0000 ug | PREFILLED_SYRINGE | Freq: Once | INTRAMUSCULAR | Status: AC
  Administered 2023-07-05: 500 ug via SUBCUTANEOUS
  Filled 2023-07-05: qty 1

## 2023-07-05 MED ORDER — LUSPATERCEPT-AAMT 75 MG ~~LOC~~ SOLR
1.0000 mg/kg | Freq: Once | SUBCUTANEOUS | Status: AC
  Administered 2023-07-05: 70 mg via SUBCUTANEOUS
  Filled 2023-07-05: qty 1.4

## 2023-07-05 NOTE — Progress Notes (Signed)
Given gabapentin by rheumatology, not started.

## 2023-07-05 NOTE — Progress Notes (Signed)
[facility] CONSULT NOTE  Patient Care Team: [name], Audrie Gallus, MD as PCP - General Donneta Romberg, Worthy Flank, MD as Consulting Physician (Hematology and Oncology)  CHIEF COMPLAINTS/PURPOSE OF CONSULTATION: MDS   Oncology History Overview Note   # July 2020-myelodysplastic syndrome with ringed sideroblasts-Normal karyotype; no blasts [IPSS R-Very low risk ~median survival 8.3 years]; iron studies B12 folic acid myeloma panel normal; pyridoxine levels/copper/zinc-WNL. Erythropoietin levels-60. II OPINION at [facility] [Dr.DeCastro]  # [facility]/ NGS: TET2([id number])c.2524delT(p.Ser842GlnfsTer31) Exon 3 frame-shift SF3B1([id number])c.2098A>G(p.Lys700Glu) Exon 15 missense  DNMT3A([id number])c.2204A>G(p.Tyr735Cys) Exon 19 missense   # JAN 11th 2020- Aranesp/retacrit;  # July 30th, 2021- START REVLIMID 10 mg/day  [SF3B53mutation]  MARCH 2023- BONE MARROW, ASPIRATE, CLOT, CORE:  -  Hypercellular marrow involved by myelodysplastic syndrome with ring  sideroblasts (MDS-RS)  -  Polytypic plasmacytosis   # [facility] 2023-increase the frequency of Aranesp 400 mcg every 2 weeks; continue Revlimid.   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; September 2021 ejection fraction 60 to 65%;   # colonoscopy- 2016 [Dr.Bucinni];  DIAGNOSIS: MDS/low-grade with ringed sideroblasts  STAGE: Low       ;  GOALS: Control  CURRENT/MOST RECENT THERAPY : EPO agent+ REV    MDS (myelodysplastic syndrome), low grade (HCC)  04/23/2019 Initial Diagnosis   MDS (myelodysplastic syndrome), low grade (HCC)   05/11/2022 - 05/11/2022 Chemotherapy   Patient is on Treatment Plan : MYELODYSPLASIA Luspatercept q21d     05/11/2022 -  Chemotherapy   Patient is on Treatment Plan : MYELODYSPLASIA Luspatercept q21d      HISTORY OF PRESENTING ILLNESS: Accompanied by husband.  Ambulating independently.  Carla [name] 72 y.o.  female history of low-grade MDS  anemia with ring sideroblasts currently on Aranesp/Luspatercept is here for follow-up.    In the interim evaluated by neurology for neuropathy.patient g iven gabapentin by rheumatology, not started.    Patient continues to have occasional SOB; otherwise no worsening cough or leg swelling.  Appetite is fairly good. Still taking Calcium+D with the prescription Vitamin D.   Review of Systems  Constitutional:  Positive for malaise/fatigue. Negative for chills, diaphoresis and fever.  HENT:  Negative for nosebleeds and sore throat.   Eyes:  Negative for double vision.  Respiratory:  Negative for cough, hemoptysis, sputum production, shortness of breath and wheezing.   Cardiovascular:  Negative for chest pain and orthopnea.  Gastrointestinal:  Negative for abdominal pain, blood in stool, constipation, diarrhea, heartburn, melena and vomiting.  Genitourinary:  Negative for dysuria, frequency and urgency.  Musculoskeletal:  Positive for back pain and joint pain.  Skin: Negative.  Negative for itching and rash.  Neurological:  Negative for dizziness, tingling, focal weakness, weakness and headaches.  Endo/Heme/Allergies:  Does not bruise/bleed easily.  Psychiatric/Behavioral:  Negative for depression. The patient is not nervous/anxious and does not have insomnia.     MEDICAL HISTORY:  Past Medical History:  Diagnosis Date   Allergic rhinitis, cause unspecified    Anemia, unspecified    Carpal tunnel syndrome    Cystitis, unspecified    Family history of osteoporosis    PONV (postoperative nausea and vomiting)    Postnasal drip    Syncope and collapse     SURGICAL HISTORY: Past Surgical History:  Procedure Laterality Date   BONE MARROW BIOPSY     CARPAL TUNNEL RELEASE     COLONOSCOPY  09/28/2003   COLONOSCOPY WITH PROPOFOL N/A 07/31/2015   Procedure: COLONOSCOPY WITH PROPOFOL;  Surgeon: Bernette Redbird, MD;  Location: [facility];  Service: Endoscopy;  Laterality: N/A;   DIAGNOSTIC  LAPAROSCOPY     dx lack of pregnancy    EYE SURGERY     lasik, cataract, prk,yag procedure    SOCIAL HISTORY: Social History   Socioeconomic History   Marital status: Married    Spouse name: Not on file   Number of children: Not on file   Years of education: Not on file   Highest education level: Not on file  Occupational History   Not on file  Tobacco Use   Smoking status: Never   Smokeless tobacco: Never  Vaping Use   Vaping status: Never Used  Substance and Sexual Activity   Alcohol use: Yes    Alcohol/week: 0.0 standard drinks of alcohol    Comment: Rare   Drug use: No   Sexual activity: Not Currently  Other Topics Concern   Not on file  Social History Narrative   No regular exercise      Drinks lots of [employer]         Social Determinants of Health   Financial Resource Strain: Low Risk  (05/23/2023)   Overall Financial Resource Strain (CARDIA)    Difficulty of Paying Living Expenses: Not hard at all  Food Insecurity: No Food Insecurity (05/23/2023)   Hunger Vital Sign    Worried About Running Out of Food in the Last Year: Never true    Ran Out of Food in the Last Year: Never true  Transportation Needs: No Transportation Needs (05/23/2023)   PRAPARE - Administrator, Civil Service (Medical): No    Lack of Transportation (Non-Medical): No  Physical Activity: Inactive (05/23/2023)   Exercise Vital Sign    Days of Exercise per Week: 0 days    Minutes of Exercise per Session: 0 min  Stress: No Stress Concern Present (05/23/2023)   [employer] of Occupational Health - Occupational Stress Questionnaire    Feeling of Stress : Not at all  Social Connections: Moderately Integrated (05/23/2023)   Social Connection and Isolation Panel [NHANES]    Frequency of Communication with Friends and Family: More than three times a week    Frequency of Social Gatherings with Friends and Family: More than three times a week    Attends Religious Services: More than 4  times per year    Active Member of [employer] or Organizations: No    Attends Banker Meetings: Never    Marital Status: Married  Catering manager Violence: Not At Risk (05/23/2023)   Humiliation, Afraid, Rape, and Kick questionnaire    Fear of Current or Ex-Partner: No    Emotionally Abused: No    Physically Abused: No    Sexually Abused: No    FAMILY HISTORY: Family History  Problem Relation Age of Onset   Osteoporosis Mother    Stroke Mother    Coronary artery disease Father    Stroke Father 60   Diabetes Other        Aunts and uncles   Breast cancer Paternal Aunt    Breast cancer Maternal Aunt    Arthritis/Rheumatoid Child    Breast cancer Cousin     ALLERGIES:  is allergic to codeine.  MEDICATIONS:  Current Outpatient Medications  Medication Sig Dispense Refill   acetaminophen (TYLENOL) 500 MG tablet Take 500-1,000 mg by mouth every 6 (six) hours as needed for mild pain or headache.     aspirin EC 81 MG tablet  Take 81 mg by mouth at bedtime. Swallow whole.      Biotin 10 MG CAPS Take by mouth.     calcium-vitamin D (OSCAL WITH D) 500-5 MG-MCG tablet Take 1 tablet by mouth daily with breakfast.     Darbepoetin Alfa (ARANESP) 500 MCG/ML SOSY injection Inject 500 mcg into the skin See admin instructions. Inject 500 mcg into the skin every week     levothyroxine (SYNTHROID) 25 MCG tablet TAKE ONE TAB BY MOUTH ONCE DAILY. TAKE ON AN EMPTY STOMACH WITH A GLASS OF WATER ATLEAST 30-60 MINUTES BEFORE BREAKFAST 90 tablet 0   loratadine (CLARITIN) 10 MG tablet Take 10 mg by mouth daily.     luspatercept-aamt (REBLOZYL) 75 MG subcutaneous injection Inject into the skin.     ondansetron (ZOFRAN ODT) 8 MG disintegrating tablet Take 1 tablet (8 mg total) by mouth every 8 (eight) hours as needed for nausea or vomiting. 30 tablet 0   Vitamin D, Ergocalciferol, (DRISDOL) 1.25 MG (50000 UNIT) CAPS capsule TAKE ONE CAPSULE BY MOUTH ONCE A WEEK 12 capsule 1   clobetasol (TEMOVATE)  0.05 % external solution Apply topically. (Patient not taking: Reported on 05/23/2023)     desonide (DESOWEN) 0.05 % cream Apply topically. (Patient not taking: Reported on 05/23/2023)     gabapentin (NEURONTIN) 100 MG capsule Take 1 capsule by mouth at bedtime. (Patient not taking: Reported on 07/05/2023)     No current facility-administered medications for this visit.      PHYSICAL EXAMINATION:   Vitals:   07/05/23 0941  BP: (!) 127/57  Pulse: 73  Temp: (!) 97.1 F (36.2 C)  SpO2: 98%     Filed Weights   07/05/23 0941  Weight: 154 lb 6.4 oz (70 kg)     Physical Exam HENT:     Head: Normocephalic and atraumatic.     Mouth/Throat:     Pharynx: No oropharyngeal exudate.  Eyes:     Pupils: Pupils are equal, round, and reactive to light.  Cardiovascular:     Rate and Rhythm: Normal rate and regular rhythm.     Pulses: Normal pulses.     Heart sounds: Normal heart sounds.  Pulmonary:     Effort: Pulmonary effort is normal. No respiratory distress.     Breath sounds: Normal breath sounds. No wheezing.  Abdominal:     General: Bowel sounds are normal. There is no distension.     Palpations: Abdomen is soft. There is no mass.     Tenderness: There is no abdominal tenderness. There is no guarding or rebound.  Musculoskeletal:        General: No tenderness. Normal range of motion.     Cervical back: Normal range of motion and neck supple.  Skin:    General: Skin is warm.  Neurological:     Mental Status: She is alert and oriented to person, place, and time.  Psychiatric:        Mood and Affect: Affect normal.     LABORATORY DATA:  I have reviewed the data as listed Lab Results  Component Value Date   WBC 13.8 (H) 07/05/2023   HGB 9.9 (L) 07/05/2023   HCT 31.5 (L) 07/05/2023   MCV 96.0 07/05/2023   PLT 233 07/05/2023   Recent Labs    05/25/23 0849 06/15/23 1233 07/05/23 0952  NA 136 136 134*  K 4.1 4.1 3.8  CL 106 106 104  CO2 23 24 21*  GLUCOSE 144*  109* 165*  BUN 26* 24* 26*  CREATININE 1.08* 0.99 1.20*  CALCIUM 9.1 9.1 9.0  GFRNONAA 55* >60 48*  PROT 8.1 7.9 7.9  ALBUMIN 4.3 4.3 4.3  AST 21 22 24   ALT 15 15 16   ALKPHOS 46 42 42  BILITOT 0.7 0.7 0.6     No results found.  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 POSITIVE  For SF3B1; R-IPSS-low risk. MARCH 2023-bone marrow biopsy no evidence of obvious progression of MDS or any acute process.  Currently on Aranesp 100 mcg every 2-3 week plus Luspatercept.   # proceed with Luspatercept # 21  today  Continue with Aranesp prn for Hb < 10. Today- Hb 9.9 -overall stable.  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 mprovement with claritin. S/p  appt with [facility] rheumatology; OFF cymblata [constipation]/nausea; awaiting on PT/water aerobics- stable.    # vit D def- FEB 2024-Vitamin D- 25/low-  on Vit D 50-continue vitamin D weekly. JUNE 2024-  Vit D-40- stable.    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  ? Etiology- poor tolerance to cymablta. S/p evaluation with Dr.Vaslow.  currently waiting on gabapentin [Dr.Patel]- Monitor for now  # CKD- stage III- 50s- monitor for now-stable.  # Vaccination: s/p flu shot. Consider/recommend COVID/ RSV-   # handicapped parking.   # DISPOSITION:  # proceed with Aranesp today # today Luspatecept  # follow up in 3 weeks--APP;  labs CBC/cmp;  LDH;  possible aranesp; Luspatercept SQ-   # follow up in 6 weeks--APP;  labs CBC/cmp;  LDH;  possible aranesp; Luspatercept SQ Dr.B     All questions were answered. The patient knows to call the clinic with any problems, questions or concerns.   Earna Coder, MD 07/05/2023 2:26 PM

## 2023-07-05 NOTE — Assessment & Plan Note (Addendum)
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 MARCH 2023-bone marrow biopsy no evidence of obvious progression of MDS or any acute process.  Currently on Aranesp 100 mcg every 2-3 week plus Luspatercept.   # proceed with Luspatercept # 21  today  Continue with Aranesp prn for Hb < 10. Today- Hb 9.9 -overall stable.  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 mprovement with claritin. S/p  appt with [facility] rheumatology; OFF cymblata [constipation]/nausea; awaiting on PT/water aerobics- stable.    # vit D def- FEB 2024-Vitamin D- 25/low-  on Vit D 50-continue vitamin D weekly. JUNE 2024-  Vit D-40- stable.    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  ? Etiology- poor tolerance to cymablta. S/p evaluation with Dr.Vaslow.  currently waiting on gabapentin [Dr.Patel]- Monitor for now  # CKD- stage III- 50s- monitor for now-stable.  # Vaccination: s/p flu shot. Consider/recommend COVID/ RSV-   # handicapped parking.   # DISPOSITION:  # proceed with Aranesp today # today Luspatecept  # follow up in 3 weeks--APP;  labs CBC/cmp;  LDH;  possible aranesp; Luspatercept SQ-   # follow up in 6 weeks--APP;  labs CBC/cmp;  LDH;  possible aranesp; Luspatercept SQ Dr.B

## 2023-07-06 ENCOUNTER — Encounter: Payer: Self-pay | Admitting: Internal Medicine

## 2023-07-06 ENCOUNTER — Other Ambulatory Visit (INDEPENDENT_AMBULATORY_CARE_PROVIDER_SITE_OTHER): Payer: PPO

## 2023-07-06 DIAGNOSIS — E559 Vitamin D deficiency, unspecified: Secondary | ICD-10-CM | POA: Diagnosis not present

## 2023-07-06 DIAGNOSIS — E039 Hypothyroidism, unspecified: Secondary | ICD-10-CM

## 2023-07-06 DIAGNOSIS — R739 Hyperglycemia, unspecified: Secondary | ICD-10-CM

## 2023-07-06 DIAGNOSIS — R944 Abnormal results of kidney function studies: Secondary | ICD-10-CM

## 2023-07-06 LAB — COMPREHENSIVE METABOLIC PANEL
ALT: 13 U/L (ref 0–35)
AST: 16 U/L (ref 0–37)
Albumin: 4.4 g/dL (ref 3.5–5.2)
Alkaline Phosphatase: 46 U/L (ref 39–117)
BUN: 20 mg/dL (ref 6–23)
CO2: 28 meq/L (ref 19–32)
Calcium: 10 mg/dL (ref 8.4–10.5)
Chloride: 103 meq/L (ref 96–112)
Creatinine, Ser: 1 mg/dL (ref 0.40–1.20)
GFR: 56.05 mL/min — ABNORMAL LOW (ref >60.00)
Glucose, Bld: 94 mg/dL (ref 70–99)
Potassium: 4.5 meq/L (ref 3.5–5.1)
Sodium: 139 meq/L (ref 135–145)
Total Bilirubin: 0.8 mg/dL (ref 0.2–1.2)
Total Protein: 7.5 g/dL (ref 6.0–8.3)

## 2023-07-06 LAB — HEMOGLOBIN A1C: Hgb A1c MFr Bld: 5.8 % (ref 4.6–6.5)

## 2023-07-06 LAB — TSH: TSH: 3.43 u[IU]/mL (ref 0.35–5.50)

## 2023-07-06 LAB — VITAMIN D 25 HYDROXY (VIT D DEFICIENCY, FRACTURES): VITD: 24.8 ng/mL — ABNORMAL LOW (ref 30.00–100.00)

## 2023-07-06 NOTE — Addendum Note (Signed)
Addended by: Earna Coder on: 07/06/2023 04:25 PM   Modules accepted: Orders

## 2023-07-12 NOTE — Progress Notes (Unsigned)
Subjective:    Patient ID: Carla [name], female    DOB: 1951-09-11, 72 y.o.   MRN: [id number]  HPI  Here for health maintenance exam and to review chronic medical problems   Wt Readings from Last 3 Encounters:  07/13/23 153 lb (69.4 kg)  07/05/23 154 lb 6.4 oz (70 kg)  06/15/23 152 lb (68.9 kg)   26.47 kg/m  Vitals:   07/13/23 1133  BP: 118/68  Pulse: 65  Temp: 97.7 F (36.5 C)  SpO2: 97%    Immunization History  Administered Date(s) Administered   Fluad Quad(high Dose 65+) 07/05/2019, 06/24/2020, 07/07/2021   Fluad Trivalent(High Dose 65+) 07/13/2023   Influenza Split 06/22/2011   Influenza Whole 06/26/2008, 07/15/2009, 08/17/2012   Influenza, High Dose Seasonal PF 07/13/2015, 06/27/2017   Influenza,inj,Quad PF,6+ Mos 07/18/2013   Influenza-Unspecified 07/11/2014, 07/09/2016   PFIZER(Purple Top)SARS-COV-2 Vaccination 11/03/2019, 11/24/2019   Pneumococcal Conjugate-13 10/10/2015   Pneumococcal Polysaccharide-23 12/14/2016   Tdap 06/22/2011   Zoster Recombinant(Shingrix) 07/31/2019, 12/19/2019   Zoster, Live 07/05/2012    Health Maintenance Due  Topic Date Due   MAMMOGRAM  07/01/2023   Flu shot -will get today   Tetanus - does not want unless injured    Mammogram 06/2022  Order is in -she will call to schedule  Self breast exam-no lumps   Gyn health No problems    Colon cancer screening -colonoscopy 07/2015   Bone health  Dexa  01/2018  normal bmd at [facility]- none  Fractures-none  Supplements  Last vitamin D Lab Results  Component Value Date   VD25OH 24.80 (L) 07/06/2023  Prescription weekly D from oncology   Exercise : limited/ arthritis/ pain  Shoulder problems  Also schedule / with doctor visits  Still picks up grandkids from school   Is active and very busy   Husband fell and broke femur/ starting to improve    Derm care  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  Has rosacea   Rheum- Dr Allena Katz  Having OA also    Mood     05/23/2023    9:22 AM 05/20/2022    9:30 AM 05/07/2022   10:59 AM 04/29/2022   11:15 AM 04/27/2021   10:33 AM  Depression screen PHQ 2/9  Decreased Interest 0 0 0 0 0  Down, Depressed, Hopeless 0 0 0 0 0  PHQ - 2 Score 0 0 0 0 0  Altered sleeping  0     Tired, decreased energy  0     Change in appetite  0     Feeling bad or failure about yourself   0     Trouble concentrating  0     Moving slowly or fidgety/restless  0     Suicidal thoughts  0     PHQ-9 Score  0     Difficult doing work/chores  Not difficult at all       Hypothyroidism  Pt has no clinical changes No change in energy level/ hair or skin/ edema and no tremor Lab Results  Component Value Date   TSH 3.43 07/06/2023    Levothyroxine 25 mcg daily     MDS In care of heme /onc  Lab Results  Component Value Date   WBC 13.8 (H) 07/05/2023   HGB 9.9 (L) 07/05/2023   HCT 31.5 (L) 07/05/2023   MCV 96.0 07/05/2023   PLT 233 07/05/2023  Aranesp Luspatecept  Lots of cancer center visits  Glucose Lab Results  Component Value Date   HGBA1C 5.8 07/06/2023    Ckd 3 Lab Results  Component Value Date   NA 139 07/06/2023   K 4.5 07/06/2023   CO2 28 07/06/2023   GLUCOSE 94 07/06/2023   BUN 20 07/06/2023   CREATININE 1.00 07/06/2023   CALCIUM 10.0 07/06/2023   GFR 56.05 (L) 07/06/2023   GFRNONAA 48 (L) 07/05/2023  Fluid intake- pretty good    Cholesterol Lab Results  Component Value Date   CHOL 138 04/23/2022   HDL 63.10 04/23/2022   LDLCALC 66 04/23/2022   LDLDIRECT 123.0 09/16/2009   TRIG 47.0 04/23/2022   CHOLHDL 2 04/23/2022        Patient Active Problem List   Diagnosis Date Noted   Immunocompromised (HCC) 07/13/2023   Prediabetes 07/03/2023   Chronic kidney disease (CKD) stage G3a/A1, moderately decreased glomerular filtration rate (GFR) between 45-59 mL/min/1.73 square meter and albuminuria creatinine ratio less than 30 mg/g (HCC) 07/03/2023   Neuropathy 05/27/2023   Nontraumatic  incomplete tear of right rotator cuff 05/03/2022   Rotator cuff tendinitis, right 05/03/2022   Tendinitis of upper biceps tendon of right shoulder 05/03/2022   Encounter for screening mammogram for breast cancer 04/27/2021   Vitamin D deficiency 04/27/2021   Symptomatic anemia 05/17/2020   Goals of care, counseling/discussion 04/15/2020   MDS (myelodysplastic syndrome), 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   Hypothyroidism 12/20/2017   Normocytic anemia 12/19/2017   Estrogen deficiency 10/10/2015   Encounter for routine gynecological examination 06/22/2011   Routine general medical examination at a health care facility 06/15/2011   Allergic rhinitis 06/26/2008   Past Medical History:  Diagnosis Date   Allergic rhinitis, cause unspecified    Anemia, unspecified    Carpal tunnel syndrome    Cystitis, unspecified    Family history of osteoporosis    PONV (postoperative nausea and vomiting)    Postnasal drip    Syncope and collapse    Past Surgical History:  Procedure Laterality Date   BONE MARROW BIOPSY     CARPAL TUNNEL RELEASE     COLONOSCOPY  09/28/2003   COLONOSCOPY WITH PROPOFOL N/A 07/31/2015   Procedure: COLONOSCOPY WITH PROPOFOL;  Surgeon: Bernette Redbird, MD;  Location: [facility];  Service: Endoscopy;  Laterality: N/A;   DIAGNOSTIC LAPAROSCOPY     dx lack of pregnancy    EYE SURGERY     lasik, cataract, prk,yag procedure   Social History   Tobacco Use   Smoking status: Never   Smokeless tobacco: Never  Vaping Use   Vaping status: Never Used  Substance Use Topics   Alcohol use: Yes    Alcohol/week: 0.0 standard drinks of alcohol    Comment: Rare   Drug use: No   Family History  Problem Relation Age of Onset   Osteoporosis Mother    Stroke Mother    Coronary artery disease Father    Stroke Father 65   Diabetes Other        Aunts and uncles   Breast cancer Paternal Aunt    Breast cancer Maternal Aunt    Arthritis/Rheumatoid Child    Breast cancer Cousin     Allergies  Allergen Reactions   Codeine Nausea Only   Current Outpatient Medications on File Prior to Visit  Medication Sig Dispense Refill   acetaminophen (TYLENOL) 500 MG tablet Take 500-1,000 mg by mouth every 6 (six) hours as needed for mild pain or headache.     aspirin EC 81 MG  tablet Take 81 mg by mouth at bedtime. Swallow whole.      Biotin 10 MG CAPS Take by mouth.     calcium-vitamin D (OSCAL WITH D) 500-5 MG-MCG tablet Take 1 tablet by mouth daily with breakfast.     clobetasol (TEMOVATE) 0.05 % external solution Apply topically.     Darbepoetin Alfa (ARANESP) 500 MCG/ML SOSY injection Inject 500 mcg into the skin See admin instructions. Inject 500 mcg into the skin every week     desonide (DESOWEN) 0.05 % cream Apply topically.     gabapentin (NEURONTIN) 100 MG capsule Take 1 capsule by mouth at bedtime.     loratadine (CLARITIN) 10 MG tablet Take 10 mg by mouth daily.     luspatercept-aamt (REBLOZYL) 75 MG subcutaneous injection Inject into the skin.     ondansetron (ZOFRAN ODT) 8 MG disintegrating tablet Take 1 tablet (8 mg total) by mouth every 8 (eight) hours as needed for nausea or vomiting. 30 tablet 0   Vitamin D, Ergocalciferol, (DRISDOL) 1.25 MG (50000 UNIT) CAPS capsule TAKE ONE CAPSULE BY MOUTH ONCE A WEEK 12 capsule 1   No current facility-administered medications on file prior to visit.    Review of Systems  Constitutional:  Positive for fatigue. Negative for activity change, appetite change, fever and unexpected weight change.  HENT:  Negative for congestion, ear pain, rhinorrhea, sinus pressure and sore throat.   Eyes:  Negative for pain, redness and visual disturbance.  Respiratory:  Negative for cough, shortness of breath and wheezing.   Cardiovascular:  Negative for chest pain and palpitations.  Gastrointestinal:  Negative for abdominal pain, blood in stool, constipation and diarrhea.  Endocrine: Negative for polydipsia and polyuria.  Genitourinary:   Negative for dysuria, frequency and urgency.  Musculoskeletal:  Positive for arthralgias. Negative for back pain and myalgias.  Skin:  Positive for rash. Negative for pallor.  Allergic/Immunologic: Negative for environmental allergies.  Neurological:  Negative for dizziness, syncope and headaches.  Hematological:  Negative for adenopathy. Does not bruise/bleed easily.  Psychiatric/Behavioral:  Negative for decreased concentration and dysphoric mood. The patient is not nervous/anxious.        Objective:   Physical Exam Constitutional:      General: She is not in acute distress.    Appearance: Normal appearance. She is well-developed and normal weight. She is not ill-appearing or diaphoretic.  HENT:     Head: Normocephalic and atraumatic.     Right Ear: Tympanic membrane, ear canal and external ear normal.     Left Ear: Tympanic membrane, ear canal and external ear normal.     Nose: Nose normal. No congestion.     Mouth/Throat:     Mouth: Mucous membranes are moist.     Pharynx: Oropharynx is clear. No posterior oropharyngeal erythema.  Eyes:     General: No scleral icterus.    Extraocular Movements: Extraocular movements intact.     Conjunctiva/sclera: Conjunctivae normal.     Pupils: Pupils are equal, round, and reactive to light.  Neck:     Thyroid: No thyromegaly.     Vascular: No carotid bruit or JVD.  Cardiovascular:     Rate and Rhythm: Normal rate and regular rhythm.     Pulses: Normal pulses.     Heart sounds: Normal heart sounds.     No gallop.  Pulmonary:     Effort: Pulmonary effort is normal. No respiratory distress.     Breath sounds: Normal breath sounds. No wheezing.  Comments: Good air exch Chest:     Chest wall: No tenderness.  Abdominal:     General: Bowel sounds are normal. There is no distension or abdominal bruit.     Palpations: Abdomen is soft. There is no mass.     Tenderness: There is no abdominal tenderness.     Hernia: No hernia is present.   Genitourinary:    Comments: Breast exam: No mass, nodules, thickening, tenderness, bulging, retraction, inflamation, nipple discharge or skin changes noted.  No axillary or clavicular LA.     Musculoskeletal:        General: No tenderness. Normal range of motion.     Cervical back: Normal range of motion and neck supple. No rigidity. No muscular tenderness.     Right lower leg: No edema.     Left lower leg: No edema.     Comments: No kyphosis   Lymphadenopathy:     Cervical: No cervical adenopathy.  Skin:    General: Skin is warm and dry.     Coloration: Skin is not pale.     Findings: No erythema or rash.     Comments: Solar lentigines diffusely Rosacea Dry skin Some sks   Neurological:     Mental Status: She is alert. Mental status is at baseline.     Cranial Nerves: No cranial nerve deficit.     Motor: No abnormal muscle tone.     Coordination: Coordination normal.     Gait: Gait normal.     Deep Tendon Reflexes: Reflexes are normal and symmetric. Reflexes normal.  Psychiatric:        Mood and Affect: Mood normal.        Cognition and Memory: Cognition and memory normal.           Assessment & Plan:   Problem List Items Addressed This Visit       Endocrine   Hypothyroidism    Hypothyroidism  Pt has no clinical changes No change in energy level/ hair or skin/ edema and no tremor Lab Results  Component Value Date   TSH 3.43 07/06/2023    Plan to continue levothyroxine 25 mcg daily       Relevant Medications   levothyroxine (SYNTHROID) 25 MCG tablet     Nervous and Auditory   Neuropathy    Continues low dose gabapentin        Genitourinary   Chronic kidney disease (CKD) stage G3a/A1, moderately decreased glomerular filtration rate (GFR) between 45-59 mL/min/1.73 square meter and albuminuria creatinine ratio less than 30 mg/g (HCC)    GFR 56.05 Encouraged good water intake         Other   Estrogen deficiency    Dexa ordered      Relevant  Orders   DG Bone Density   Immunocompromised (HCC)    In treatment for MDS   Flu shot given today Encouraged caution and use of mask      MDS (myelodysplastic syndrome), low grade (HCC)    Continues heme/onc care       Prediabetes    Lab Results  Component Value Date   HGBA1C 5.8 07/06/2023   disc imp of low glycemic diet and wt loss to prevent DM2       Routine general medical examination at a health care facility - Primary    Reviewed health habits including diet and exercise and skin cancer prevention Reviewed appropriate screening tests for age  Also reviewed health mt list, fam hx  and immunization status , as well as social and family history   See HPI Labs reviewed and ordered Flu shot given Discussed tetanus shot at pharmacy-she Lares only get if injured  Mammogram and dexa ordered Discussed fall prevention, supplements and exercise for bone density  Derm care is utd  PHq last was 0       Vitamin D deficiency    Last vitamin D Lab Results  Component Value Date   VD25OH 24.80 (L) 07/06/2023    Taking high dose weekly treatment from oncol Stressed importance to bone health      Other Visit Diagnoses     Need for influenza vaccination       Relevant Orders   Flu Vaccine Trivalent High Dose (Fluad) (Completed)

## 2023-07-13 ENCOUNTER — Ambulatory Visit (INDEPENDENT_AMBULATORY_CARE_PROVIDER_SITE_OTHER): Payer: PPO | Admitting: Family Medicine

## 2023-07-13 ENCOUNTER — Encounter: Payer: Self-pay | Admitting: Family Medicine

## 2023-07-13 VITALS — BP 118/68 | HR 65 | Temp 97.7°F | Ht 63.75 in | Wt 153.0 lb

## 2023-07-13 DIAGNOSIS — Z23 Encounter for immunization: Secondary | ICD-10-CM | POA: Diagnosis not present

## 2023-07-13 DIAGNOSIS — E2839 Other primary ovarian failure: Secondary | ICD-10-CM | POA: Diagnosis not present

## 2023-07-13 DIAGNOSIS — R7309 Other abnormal glucose: Secondary | ICD-10-CM

## 2023-07-13 DIAGNOSIS — N1831 Chronic kidney disease, stage 3a: Secondary | ICD-10-CM

## 2023-07-13 DIAGNOSIS — R739 Hyperglycemia, unspecified: Secondary | ICD-10-CM

## 2023-07-13 DIAGNOSIS — D849 Immunodeficiency, unspecified: Secondary | ICD-10-CM

## 2023-07-13 DIAGNOSIS — E559 Vitamin D deficiency, unspecified: Secondary | ICD-10-CM

## 2023-07-13 DIAGNOSIS — D46Z Other myelodysplastic syndromes: Secondary | ICD-10-CM

## 2023-07-13 DIAGNOSIS — G629 Polyneuropathy, unspecified: Secondary | ICD-10-CM

## 2023-07-13 DIAGNOSIS — D649 Anemia, unspecified: Secondary | ICD-10-CM

## 2023-07-13 DIAGNOSIS — Z0000 Encounter for general adult medical examination without abnormal findings: Secondary | ICD-10-CM

## 2023-07-13 DIAGNOSIS — R7303 Prediabetes: Secondary | ICD-10-CM | POA: Diagnosis not present

## 2023-07-13 DIAGNOSIS — E039 Hypothyroidism, unspecified: Secondary | ICD-10-CM | POA: Diagnosis not present

## 2023-07-13 MED ORDER — LEVOTHYROXINE SODIUM 25 MCG PO TABS: ORAL_TABLET | ORAL | 3 refills | Status: DC

## 2023-07-13 NOTE — Assessment & Plan Note (Signed)
Hypothyroidism  Pt has no clinical changes No change in energy level/ hair or skin/ edema and no tremor Lab Results  Component Value Date   TSH 3.43 07/06/2023    Plan to continue levothyroxine 25 mcg daily

## 2023-07-13 NOTE — Assessment & Plan Note (Addendum)
Last vitamin D Lab Results  Component Value Date   VD25OH 24.80 (L) 07/06/2023    Taking high dose weekly treatment from oncol Stressed importance to bone health

## 2023-07-13 NOTE — Patient Instructions (Addendum)
You are due for a tetanus shot - you can get that at a pharmacy    Consider starting back with exercise if /when you can Add some strength training to your routine, this is important for bone and brain health and can reduce your risk of falls and help your body use insulin properly and regulate weight  Light weights, exercise bands , and internet videos are a good way to start  Yoga (chair or regular), machines , floor exercises or a gym with machines are also good options     For prediabetes  Try to get most of your carbohydrates from produce (with the exception of white potatoes) and whole grains Eat less bread/pasta/rice/snack foods/cereals/sweets and other items from the middle of the grocery store (processed carbs)  Please start by weaning off the sugar soda (save it for special occasions)      Call and schedule your dexa and mammogram   You have an order for:  []   2D Mammogram  [x]   3D Mammogram  [x]   Bone Density     Please call for appointment:   [x]   [facility]  [address] [place] [address]  [phone]  []   [facility])   [address]  [place], [place] [address]  [phone]  []   [facility]      [address] [place], [place]        [phone]         []   [facility]  [address] [place], [place]  [phone]  []  [facility]   [address]   [place], [place] [address]  [phone]  []  [facility]  [address] [place], [place] [address] [phone]    Make sure to wear two piece clothing  No lotions powders or deodorants the day of the appointment Make sure to bring picture ID and insurance card.  Bring list of medications you are currently taking including any supplements.    Schedule your screening mammogram through MyChart!   [facility] [place] imaging sites can now be scheduled through MyChart.  Log into your MyChart account.  Go to 'Visit' (or 'Appointments' if  on mobile App) --> Schedule an  Appointment  Under 'Select a Reason for Visit' choose the Mammogram  Screening option.  Complete the pre-visit questions  and select the time and place that  best fits your schedule

## 2023-07-13 NOTE — Assessment & Plan Note (Signed)
Lab Results  Component Value Date   HGBA1C 5.8 07/06/2023   disc imp of low glycemic diet and wt loss to prevent DM2

## 2023-07-13 NOTE — Assessment & Plan Note (Signed)
Dexa ordered

## 2023-07-13 NOTE — Assessment & Plan Note (Signed)
Reviewed health habits including diet and exercise and skin cancer prevention Reviewed appropriate screening tests for age  Also reviewed health mt list, fam hx and immunization status , as well as social and family history   See HPI Labs reviewed and ordered Flu shot given Discussed tetanus shot at pharmacy-she [place] only get if injured  Mammogram and dexa ordered Discussed fall prevention, supplements and exercise for bone density  Derm care is utd  PHq last was 0

## 2023-07-13 NOTE — Assessment & Plan Note (Signed)
Continues heme/onc care

## 2023-07-13 NOTE — Assessment & Plan Note (Signed)
GFR 56.05 Encouraged good water intake

## 2023-07-13 NOTE — Assessment & Plan Note (Signed)
In treatment for MDS   Flu shot given today Encouraged caution and use of mask

## 2023-07-13 NOTE — Assessment & Plan Note (Signed)
Continues low dose gabapentin

## 2023-07-26 ENCOUNTER — Ambulatory Visit
Admission: RE | Admit: 2023-07-26 | Discharge: 2023-07-26 | Disposition: A | Discharge status: Home / Self Care | Payer: PPO | Source: Ambulatory Visit | Attending: Family Medicine | Admitting: Family Medicine

## 2023-07-26 DIAGNOSIS — Z1231 Encounter for screening mammogram for malignant neoplasm of breast: Secondary | ICD-10-CM | POA: Diagnosis not present

## 2023-07-28 ENCOUNTER — Inpatient Hospital Stay (HOSPITAL_BASED_OUTPATIENT_CLINIC_OR_DEPARTMENT_OTHER): Payer: PPO | Admitting: Nurse Practitioner

## 2023-07-28 ENCOUNTER — Encounter: Payer: Self-pay | Admitting: Nurse Practitioner

## 2023-07-28 ENCOUNTER — Ambulatory Visit: Payer: PPO

## 2023-07-28 ENCOUNTER — Inpatient Hospital Stay: Payer: PPO

## 2023-07-28 ENCOUNTER — Ambulatory Visit: Payer: PPO | Admitting: Nurse Practitioner

## 2023-07-28 ENCOUNTER — Other Ambulatory Visit: Payer: PPO

## 2023-07-28 VITALS — BP 111/62 | HR 68 | Temp 98.6°F | Resp 19 | Wt 154.2 lb

## 2023-07-28 DIAGNOSIS — Z79899 Other long term (current) drug therapy: Secondary | ICD-10-CM

## 2023-07-28 DIAGNOSIS — D46Z Other myelodysplastic syndromes: Secondary | ICD-10-CM

## 2023-07-28 DIAGNOSIS — D461 Refractory anemia with ring sideroblasts: Secondary | ICD-10-CM | POA: Diagnosis not present

## 2023-07-28 DIAGNOSIS — R202 Paresthesia of skin: Secondary | ICD-10-CM | POA: Diagnosis not present

## 2023-07-28 DIAGNOSIS — L930 Discoid lupus erythematosus: Secondary | ICD-10-CM | POA: Diagnosis not present

## 2023-07-28 LAB — CMP (CANCER CENTER ONLY)
ALT: 17 U/L (ref 0–44)
AST: 20 U/L (ref 15–41)
Albumin: 4.3 g/dL (ref 3.5–5.0)
Alkaline Phosphatase: 46 U/L (ref 38–126)
Anion gap: 7 (ref 5–15)
BUN: 20 mg/dL (ref 8–23)
CO2: 26 mmol/L (ref 22–32)
Calcium: 9.3 mg/dL (ref 8.9–10.3)
Chloride: 105 mmol/L (ref 98–111)
Creatinine: 1.12 mg/dL — ABNORMAL HIGH (ref 0.44–1.00)
GFR, Estimated: 52 mL/min — ABNORMAL LOW (ref >60)
Glucose, Bld: 125 mg/dL — ABNORMAL HIGH (ref 70–99)
Potassium: 4.1 mmol/L (ref 3.5–5.1)
Sodium: 138 mmol/L (ref 135–145)
Total Bilirubin: 0.6 mg/dL (ref 0.3–1.2)
Total Protein: 7.8 g/dL (ref 6.5–8.1)

## 2023-07-28 LAB — CBC WITH DIFFERENTIAL (CANCER CENTER ONLY)
Abs Immature Granulocytes: 1.01 10*3/uL — ABNORMAL HIGH (ref 0.00–0.07)
Basophils Absolute: 0.2 10*3/uL — ABNORMAL HIGH (ref 0.0–0.1)
Basophils Relative: 1 %
Eosinophils Absolute: 0.1 10*3/uL (ref 0.0–0.5)
Eosinophils Relative: 1 %
HCT: 33.5 % — ABNORMAL LOW (ref 36.0–46.0)
Hemoglobin: 10.6 g/dL — ABNORMAL LOW (ref 12.0–15.0)
Immature Granulocytes: 7 %
Lymphocytes Relative: 18 %
Lymphs Abs: 2.6 10*3/uL (ref 0.7–4.0)
MCH: 30.5 pg (ref 26.0–34.0)
MCHC: 31.6 g/dL (ref 30.0–36.0)
MCV: 96.3 fL (ref 80.0–100.0)
Monocytes Absolute: 1.7 10*3/uL — ABNORMAL HIGH (ref 0.1–1.0)
Monocytes Relative: 12 %
Neutro Abs: 8.7 10*3/uL — ABNORMAL HIGH (ref 1.7–7.7)
Neutrophils Relative %: 61 %
Platelet Count: 286 10*3/uL (ref 150–400)
RBC: 3.48 MIL/uL — ABNORMAL LOW (ref 3.87–5.11)
RDW: 30.6 % — ABNORMAL HIGH (ref 11.5–15.5)
Smear Review: ADEQUATE
WBC Count: 14.2 10*3/uL — ABNORMAL HIGH (ref 4.0–10.5)
nRBC: 1.1 % — ABNORMAL HIGH (ref 0.0–0.2)

## 2023-07-28 LAB — LACTATE DEHYDROGENASE: LDH: 151 U/L (ref 98–192)

## 2023-07-28 MED ORDER — LUSPATERCEPT-AAMT 75 MG ~~LOC~~ SOLR
1.0000 mg/kg | Freq: Once | SUBCUTANEOUS | Status: AC
  Administered 2023-07-28: 70 mg via SUBCUTANEOUS
  Filled 2023-07-28: qty 1.4

## 2023-07-28 NOTE — Progress Notes (Signed)
[facility] CONSULT NOTE  Patient Care Team: [name], Audrie Gallus, MD as PCP - General Donneta Romberg, Worthy Flank, MD as Consulting Physician (Hematology and Oncology)  CHIEF COMPLAINTS/PURPOSE OF CONSULTATION: MDS  Oncology History Overview Note   # July 2020-myelodysplastic syndrome with ringed sideroblasts-Normal karyotype; no blasts [IPSS R-Very low risk ~median survival 8.3 years]; iron studies B12 folic acid myeloma panel normal; pyridoxine levels/copper/zinc-WNL. Erythropoietin levels-60. II OPINION at [facility] [Dr.DeCastro]  # [facility]/ NGS: TET2([id number])c.2524delT(p.Ser842GlnfsTer31) Exon 3 frame-shift SF3B1([id number])c.2098A>G(p.Lys700Glu) Exon 15 missense  DNMT3A(NM_[id number])c.2204A>G(p.Tyr735Cys) Exon 19 missense   # JAN 11th 2020- Aranesp/retacrit;  # July 30th, 2021- START REVLIMID 10 mg/day  [SF3B14mutation]  MARCH 2023- BONE MARROW, ASPIRATE, CLOT, CORE:  -  Hypercellular marrow involved by myelodysplastic syndrome with ring  sideroblasts (MDS-RS)  -  Polytypic plasmacytosis   # MARCh 2023-increase the frequency of Aranesp 400 mcg every 2 weeks; continue Revlimid.   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; September 2021 ejection fraction 60 to 65%;   # colonoscopy- 2016 [Dr.Bucinni];  DIAGNOSIS: MDS/low-grade with ringed sideroblasts  STAGE: Low       ;  GOALS: Control  CURRENT/MOST RECENT THERAPY : EPO agent+ REV    MDS (myelodysplastic syndrome), low grade (HCC)  04/23/2019 Initial Diagnosis   MDS (myelodysplastic syndrome), low grade (HCC)   05/11/2022 - 05/11/2022 Chemotherapy   Patient is on Treatment Plan : MYELODYSPLASIA Luspatercept q21d     05/11/2022 -  Chemotherapy   Patient is on Treatment Plan : MYELODYSPLASIA Luspatercept q21d      HISTORY OF PRESENTING ILLNESS: Accompanied by husband.  Ambulating independently.  Carla [name] 72 y.o. female history of low-grade MDS anemia  with ring sideroblasts currently on Aranesp/Luspatercept is here for follow-up. She has been to dermatology this morning. Overall feels at baseline. Seems to be gaining weight. Aches, pains, shortness of breath are all stable and roughly unchanged.    Review of Systems  Constitutional:  Positive for malaise/fatigue. Negative for chills, diaphoresis and fever.  HENT:  Negative for nosebleeds and sore throat.   Eyes:  Negative for double vision.  Respiratory:  Negative for cough, hemoptysis, sputum production, shortness of breath and wheezing.   Cardiovascular:  Negative for chest pain and orthopnea.  Gastrointestinal:  Negative for abdominal pain, blood in stool, constipation, diarrhea, heartburn, melena and vomiting.  Genitourinary:  Negative for dysuria, frequency and urgency.  Musculoskeletal:  Positive for back pain and joint pain.  Skin: Negative.  Negative for itching and rash.  Neurological:  Negative for dizziness, tingling, focal weakness, weakness and headaches.  Endo/Heme/Allergies:  Does not bruise/bleed easily.  Psychiatric/Behavioral:  Negative for depression. The patient is not nervous/anxious and does not have insomnia.    MEDICAL HISTORY:  Past Medical History:  Diagnosis Date   Allergic rhinitis, cause unspecified    Anemia, unspecified    Carpal tunnel syndrome    Cystitis, unspecified    Family history of osteoporosis    PONV (postoperative nausea and vomiting)    Postnasal drip    Syncope and collapse    SURGICAL HISTORY: Past Surgical History:  Procedure Laterality Date   BONE MARROW BIOPSY     CARPAL TUNNEL RELEASE     COLONOSCOPY  09/28/2003   COLONOSCOPY WITH PROPOFOL N/A 07/31/2015   Procedure: COLONOSCOPY WITH PROPOFOL;  Surgeon: Bernette Redbird, MD;  Location: [facility];  Service: Endoscopy;  Laterality: N/A;   DIAGNOSTIC LAPAROSCOPY     dx  lack of pregnancy    EYE SURGERY     lasik, cataract, prk,yag procedure   SOCIAL HISTORY: Social History    Socioeconomic History   Marital status: Married    Spouse name: Not on file   Number of children: Not on file   Years of education: Not on file   Highest education level: Not on file  Occupational History   Not on file  Tobacco Use   Smoking status: Never   Smokeless tobacco: Never  Vaping Use   Vaping status: Never Used  Substance and Sexual Activity   Alcohol use: Yes    Alcohol/week: 0.0 standard drinks of alcohol    Comment: Rare   Drug use: No   Sexual activity: Not Currently  Other Topics Concern   Not on file  Social History Narrative   No regular exercise      Drinks lots of [employer]         Social Determinants of Health   Financial Resource Strain: Low Risk  (05/23/2023)   Overall Financial Resource Strain (CARDIA)    Difficulty of Paying Living Expenses: Not hard at all  Food Insecurity: No Food Insecurity (05/23/2023)   Hunger Vital Sign    Worried About Running Out of Food in the Last Year: Never true    Ran Out of Food in the Last Year: Never true  Transportation Needs: No Transportation Needs (05/23/2023)   PRAPARE - Administrator, Civil Service (Medical): No    Lack of Transportation (Non-Medical): No  Physical Activity: Inactive (05/23/2023)   Exercise Vital Sign    Days of Exercise per Week: 0 days    Minutes of Exercise per Session: 0 min  Stress: No Stress Concern Present (05/23/2023)   [employer] of Occupational Health - Occupational Stress Questionnaire    Feeling of Stress : Not at all  Social Connections: Moderately Integrated (05/23/2023)   Social Connection and Isolation Panel [NHANES]    Frequency of Communication with Friends and Family: More than three times a week    Frequency of Social Gatherings with Friends and Family: More than three times a week    Attends Religious Services: More than 4 times per year    Active Member of [employer] or Organizations: No    Attends Banker Meetings: Never    Marital Status:  Married  Catering manager Violence: Not At Risk (05/23/2023)   Humiliation, Afraid, Rape, and Kick questionnaire    Fear of Current or Ex-Partner: No    Emotionally Abused: No    Physically Abused: No    Sexually Abused: No   FAMILY HISTORY: Family History  Problem Relation Age of Onset   Osteoporosis Mother    Stroke Mother    Coronary artery disease Father    Stroke Father 41   Diabetes Other        Aunts and uncles   Breast cancer Paternal Aunt    Breast cancer Maternal Aunt    Arthritis/Rheumatoid Child    Breast cancer Cousin    ALLERGIES:  is allergic to codeine.  MEDICATIONS:  Current Outpatient Medications  Medication Sig Dispense Refill   acetaminophen (TYLENOL) 500 MG tablet Take 500-1,000 mg by mouth every 6 (six) hours as needed for mild pain or headache.     aspirin EC 81 MG tablet Take 81 mg by mouth at bedtime. Swallow whole.      Biotin 10 MG CAPS Take by mouth.     calcium-vitamin  D (OSCAL WITH D) 500-5 MG-MCG tablet Take 1 tablet by mouth daily with breakfast.     clobetasol (TEMOVATE) 0.05 % external solution Apply topically.     Darbepoetin Alfa (ARANESP) 500 MCG/ML SOSY injection Inject 500 mcg into the skin See admin instructions. Inject 500 mcg into the skin every week     desonide (DESOWEN) 0.05 % cream Apply topically.     gabapentin (NEURONTIN) 100 MG capsule Take 1 capsule by mouth at bedtime.     hydroxychloroquine (PLAQUENIL) 200 MG tablet Take 200 mg by mouth 2 (two) times daily.     levothyroxine (SYNTHROID) 25 MCG tablet TAKE ONE TAB BY MOUTH ONCE DAILY. TAKE ON AN EMPTY STOMACH WITH A GLASS OF WATER ATLEAST 30-60 MINUTES BEFORE BREAKFAST 90 tablet 3   loratadine (CLARITIN) 10 MG tablet Take 10 mg by mouth daily.     luspatercept-aamt (REBLOZYL) 75 MG subcutaneous injection Inject into the skin.     ondansetron (ZOFRAN ODT) 8 MG disintegrating tablet Take 1 tablet (8 mg total) by mouth every 8 (eight) hours as needed for nausea or vomiting. 30  tablet 0   Vitamin D, Ergocalciferol, (DRISDOL) 1.25 MG (50000 UNIT) CAPS capsule TAKE ONE CAPSULE BY MOUTH ONCE A WEEK 12 capsule 1   No current facility-administered medications for this visit.   Facility-Administered Medications Ordered in Other Visits  Medication Dose Route Frequency Provider Last Rate Last Admin   luspatercept-aamt (REBLOZYL) subcutaneous injection 70 mg  1 mg/kg (Treatment Plan Recorded) Subcutaneous Once Earna Coder, MD       PHYSICAL EXAMINATION: Vitals:   07/28/23 1512  BP: 111/62  Pulse: 68  Resp: 19  Temp: 98.6 F (37 C)  SpO2: 97%   Filed Weights   07/28/23 1512  Weight: 154 lb 3.2 oz (69.9 kg)   Physical Exam HENT:     Head: Normocephalic and atraumatic.     Mouth/Throat:     Pharynx: No oropharyngeal exudate.  Eyes:     Pupils: Pupils are equal, round, and reactive to light.  Cardiovascular:     Rate and Rhythm: Normal rate and regular rhythm.     Pulses: Normal pulses.     Heart sounds: Normal heart sounds.  Pulmonary:     Effort: Pulmonary effort is normal. No respiratory distress.     Breath sounds: Normal breath sounds. No wheezing.  Abdominal:     General: Bowel sounds are normal. There is no distension.     Palpations: Abdomen is soft. There is no mass.     Tenderness: There is no abdominal tenderness. There is no guarding or rebound.  Musculoskeletal:        General: No tenderness. Normal range of motion.     Cervical back: Normal range of motion and neck supple.  Skin:    General: Skin is warm.  Neurological:     Mental Status: She is alert and oriented to person, place, and time.  Psychiatric:        Mood and Affect: Affect normal.     LABORATORY DATA:  I have reviewed the data as listed Lab Results  Component Value Date   WBC 13.8 (H) 07/05/2023   HGB 9.9 (L) 07/05/2023   HCT 31.5 (L) 07/05/2023   MCV 96.0 07/05/2023   PLT 233 07/05/2023   Recent Labs    05/25/23 0849 06/15/23 1233 07/05/23 0952  07/06/23 1012  NA 136 136 134* 139  K 4.1 4.1 3.8 4.5  CL 106 106  104 103  CO2 23 24 21* 28  GLUCOSE 144* 109* 165* 94  BUN 26* 24* 26* 20  CREATININE 1.08* 0.99 1.20* 1.00  CALCIUM 9.1 9.1 9.0 10.0  GFRNONAA 55* >60 48*  --   PROT 8.1 7.9 7.9 7.5  ALBUMIN 4.3 4.3 4.3 4.4  AST 21 22 24 16   ALT 15 15 16 13   ALKPHOS 46 42 42 46  BILITOT 0.7 0.7 0.6 0.8   MM 3D SCREENING MAMMOGRAM BILATERAL BREAST  Result Date: 07/28/2023 CLINICAL DATA:  Screening. EXAM: DIGITAL SCREENING BILATERAL MAMMOGRAM WITH TOMOSYNTHESIS AND CAD TECHNIQUE: Bilateral screening digital craniocaudal and mediolateral oblique mammograms were obtained. Bilateral screening digital breast tomosynthesis was performed. The images were evaluated with computer-aided detection. COMPARISON:  Previous exam(s). ACR Breast Density Category b: There are scattered areas of fibroglandular density. FINDINGS: There are no findings suspicious for malignancy. IMPRESSION: No mammographic evidence of malignancy. A result letter of this screening mammogram will be mailed directly to the patient. RECOMMENDATION: Screening mammogram in one year. (Code:SM-B-01Y) BI-RADS CATEGORY  1: Negative. Electronically Signed   By: Amie Portland M.D.   On: 07/28/2023 15:00     Assessment & Plan:   # Low grade MDS- with ringed sideroblasts; no blasts. POSITIVE  For SF3B1; R-IPSS-low risk. MARCH 2023-bone marrow biopsy no evidence of obvious progression of MDS or any acute process.  Currently on Aranesp 100 mcg every 2-3 week plus Luspatercept.    # proceed with Luspatercept # 22 today. Continue with Aranesp prn for Hb < 10. Today- Hb 10.6 today. Hold aranesp.    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 mprovement with claritin. S/p  appt with [facility] rheumatology; OFF cymblata [constipation]/nausea; awaiting on PT/water aerobics- stable.     # vit D def-  FEB 2024-Vitamin D- 25/low-  on Vit D 50-continue vitamin D weekly. JUNE 2024-  Vit D-40- stable.     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  ? Etiology- poor tolerance to cymablta. S/p evaluation with Dr.Vaslow.  currently waiting on gabapentin [Dr.Patel]- Monitor for now   # CKD- stage III- 50s- monitor for now-stable.   # Vaccination: s/p flu shot. Consider/recommend COVID/ RSV-    # handicapped parking.    Disposition:  Luspatercept today. Hold aranesp.  # follow up in 3 weeks--labs CBC/cmp, ldh; see me, +/- aranesp, luspatercept sq # follow up in 6 weeks--labs CBC/cmp, ldh; Dr Donneta Romberg, +/- aranesp, luspatercept sq  No problem-specific Assessment & Plan notes found for this encounter.  All questions were answered. The patient knows to call the clinic with any problems, questions or concerns.   Alinda Dooms, NP 07/28/2023

## 2023-08-10 ENCOUNTER — Other Ambulatory Visit: Payer: Self-pay | Admitting: Internal Medicine

## 2023-08-10 ENCOUNTER — Telehealth: Payer: Self-pay | Admitting: *Deleted

## 2023-08-10 MED ORDER — PAXLOVID (150/100) 10 X 150 MG & 10 X 100MG PO TBPK: 2.0000 | ORAL_TABLET | Freq: Two times a day (BID) | ORAL | 0 refills | Status: DC

## 2023-08-10 MED ORDER — PAXLOVID (150/100) 10 X 150 MG & 10 X 100MG PO TBPK: 2.0000 | ORAL_TABLET | Freq: Two times a day (BID) | ORAL | 0 refills | Status: AC

## 2023-08-10 NOTE — Telephone Encounter (Signed)
Patient called reporting that she has tested positive for COVID and asking if Lauren would want to order Paxlovid for her like she had last time she had COVID. She uses [facility] but they only have the low dose drug, if high does it would need to be sent to a different pharmacy

## 2023-08-10 NOTE — Progress Notes (Signed)
Called in prescription for Paxlovid lower dose given mild renal sufficiency.  Please inform patient-   GB

## 2023-08-10 NOTE — Progress Notes (Signed)
Pt.notified

## 2023-08-16 ENCOUNTER — Other Ambulatory Visit: Payer: PPO

## 2023-08-16 ENCOUNTER — Ambulatory Visit: Payer: PPO | Admitting: Nurse Practitioner

## 2023-08-16 ENCOUNTER — Ambulatory Visit: Payer: PPO

## 2023-08-18 ENCOUNTER — Inpatient Hospital Stay (HOSPITAL_BASED_OUTPATIENT_CLINIC_OR_DEPARTMENT_OTHER): Payer: PPO | Admitting: Nurse Practitioner

## 2023-08-18 ENCOUNTER — Inpatient Hospital Stay: Payer: PPO

## 2023-08-18 ENCOUNTER — Inpatient Hospital Stay: Payer: PPO | Attending: Internal Medicine

## 2023-08-18 ENCOUNTER — Encounter: Payer: Self-pay | Admitting: Nurse Practitioner

## 2023-08-18 VITALS — BP 133/70 | HR 69 | Temp 97.3°F | Wt 157.0 lb

## 2023-08-18 DIAGNOSIS — D46Z Other myelodysplastic syndromes: Secondary | ICD-10-CM | POA: Diagnosis not present

## 2023-08-18 DIAGNOSIS — Z79899 Other long term (current) drug therapy: Secondary | ICD-10-CM | POA: Diagnosis not present

## 2023-08-18 DIAGNOSIS — D461 Refractory anemia with ring sideroblasts: Secondary | ICD-10-CM | POA: Insufficient documentation

## 2023-08-18 LAB — CMP (CANCER CENTER ONLY)
ALT: 16 U/L (ref 0–44)
AST: 21 U/L (ref 15–41)
Albumin: 4.4 g/dL (ref 3.5–5.0)
Alkaline Phosphatase: 41 U/L (ref 38–126)
Anion gap: 12 (ref 5–15)
BUN: 23 mg/dL (ref 8–23)
CO2: 25 mmol/L (ref 22–32)
Calcium: 9 mg/dL (ref 8.9–10.3)
Chloride: 103 mmol/L (ref 98–111)
Creatinine: 1.09 mg/dL — ABNORMAL HIGH (ref 0.44–1.00)
GFR, Estimated: 54 mL/min — ABNORMAL LOW (ref >60)
Glucose, Bld: 128 mg/dL — ABNORMAL HIGH (ref 70–99)
Potassium: 4.4 mmol/L (ref 3.5–5.1)
Sodium: 140 mmol/L (ref 135–145)
Total Bilirubin: 0.6 mg/dL (ref <1.2)
Total Protein: 8.3 g/dL — ABNORMAL HIGH (ref 6.5–8.1)

## 2023-08-18 LAB — CBC WITH DIFFERENTIAL (CANCER CENTER ONLY)
Abs Immature Granulocytes: 0.39 10*3/uL — ABNORMAL HIGH (ref 0.00–0.07)
Basophils Absolute: 0.1 10*3/uL (ref 0.0–0.1)
Basophils Relative: 2 %
Eosinophils Absolute: 0.1 10*3/uL (ref 0.0–0.5)
Eosinophils Relative: 1 %
HCT: 36.7 % (ref 36.0–46.0)
Hemoglobin: 11.2 g/dL — ABNORMAL LOW (ref 12.0–15.0)
Immature Granulocytes: 5 %
Lymphocytes Relative: 22 %
Lymphs Abs: 1.9 10*3/uL (ref 0.7–4.0)
MCH: 29.9 pg (ref 26.0–34.0)
MCHC: 30.5 g/dL (ref 30.0–36.0)
MCV: 97.9 fL (ref 80.0–100.0)
Monocytes Absolute: 1.2 10*3/uL — ABNORMAL HIGH (ref 0.1–1.0)
Monocytes Relative: 14 %
Neutro Abs: 5 10*3/uL (ref 1.7–7.7)
Neutrophils Relative %: 56 %
Platelet Count: 227 10*3/uL (ref 150–400)
RBC: 3.75 MIL/uL — ABNORMAL LOW (ref 3.87–5.11)
RDW: 29.5 % — ABNORMAL HIGH (ref 11.5–15.5)
WBC Count: 8.7 10*3/uL (ref 4.0–10.5)
nRBC: 0.5 % — ABNORMAL HIGH (ref 0.0–0.2)

## 2023-08-18 LAB — LACTATE DEHYDROGENASE: LDH: 138 U/L (ref 98–192)

## 2023-08-18 MED ORDER — LUSPATERCEPT-AAMT 75 MG ~~LOC~~ SOLR
1.0000 mg/kg | Freq: Once | SUBCUTANEOUS | Status: AC
  Administered 2023-08-18: 70 mg via SUBCUTANEOUS
  Filled 2023-08-18: qty 1.4

## 2023-08-18 NOTE — Progress Notes (Signed)
[facility] CONSULT NOTE  Patient Care Team: [name], Audrie Gallus, MD as PCP - General Donneta Romberg, Worthy Flank, MD as Consulting Physician (Hematology and Oncology)  CHIEF COMPLAINTS/PURPOSE OF CONSULTATION: MDS  Oncology History Overview Note   # July 2020-myelodysplastic syndrome with ringed sideroblasts-Normal karyotype; no blasts [IPSS R-Very low risk ~median survival 8.3 years]; iron studies B12 folic acid myeloma panel normal; pyridoxine levels/copper/zinc-WNL. Erythropoietin levels-60. II OPINION at [facility] [Dr.DeCastro]  # [facility]/ NGS: TET2([id number])c.2524delT(p.Ser842GlnfsTer31) Exon 3 frame-shift SF3B1([id number])c.2098A>G(p.Lys700Glu) Exon 15 missense  DNMT3A([id number])c.2204A>G(p.Tyr735Cys) Exon 19 missense   # JAN 11th 2020- Aranesp/retacrit;  # July 30th, 2021- START REVLIMID 10 mg/day  [SF3B44mutation]  MARCH 2023- BONE MARROW, ASPIRATE, CLOT, CORE:  -  Hypercellular marrow involved by myelodysplastic syndrome with ring  sideroblasts (MDS-RS)  -  Polytypic plasmacytosis   # MARCh 2023-increase the frequency of Aranesp 400 mcg every 2 weeks; continue Revlimid.   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; September 2021 ejection fraction 60 to 65%;   # colonoscopy- 2016 [Dr.Bucinni];  DIAGNOSIS: MDS/low-grade with ringed sideroblasts  STAGE: Low       ;  GOALS: Control  CURRENT/MOST RECENT THERAPY : EPO agent+ REV    MDS (myelodysplastic syndrome), low grade (HCC)  04/23/2019 Initial Diagnosis   MDS (myelodysplastic syndrome), low grade (HCC)   05/11/2022 - 05/11/2022 Chemotherapy   Patient is on Treatment Plan : MYELODYSPLASIA Luspatercept q21d     05/11/2022 -  Chemotherapy   Patient is on Treatment Plan : MYELODYSPLASIA Luspatercept q21d      HISTORY OF PRESENTING ILLNESS: Accompanied by husband.  Ambulating independently.  Carla [name] 72 y.o. female history of low-grade MDS anemia  with ring sideroblasts currently on Aranesp/Luspatercept is here for follow-up. In the interim she was diagnosed with covid and received paxlovid. Tolerating treatment well and denies new complaints.    Review of Systems  Constitutional:  Positive for malaise/fatigue. Negative for chills, diaphoresis and fever.  HENT:  Negative for nosebleeds and sore throat.   Eyes:  Negative for double vision.  Respiratory:  Negative for cough, hemoptysis, sputum production, shortness of breath and wheezing.   Cardiovascular:  Negative for chest pain and orthopnea.  Gastrointestinal:  Negative for abdominal pain, blood in stool, constipation, diarrhea, heartburn, melena and vomiting.  Genitourinary:  Negative for dysuria, frequency and urgency.  Musculoskeletal:  Positive for back pain and joint pain.  Skin: Negative.  Negative for itching and rash.  Neurological:  Negative for dizziness, tingling, focal weakness, weakness and headaches.  Endo/Heme/Allergies:  Does not bruise/bleed easily.  Psychiatric/Behavioral:  Negative for depression. The patient is not nervous/anxious and does not have insomnia.    MEDICAL HISTORY:  Past Medical History:  Diagnosis Date   Allergic rhinitis, cause unspecified    Anemia, unspecified    Carpal tunnel syndrome    Cystitis, unspecified    Family history of osteoporosis    PONV (postoperative nausea and vomiting)    Postnasal drip    Syncope and collapse    SURGICAL HISTORY: Past Surgical History:  Procedure Laterality Date   BONE MARROW BIOPSY     CARPAL TUNNEL RELEASE     COLONOSCOPY  09/28/2003   COLONOSCOPY WITH PROPOFOL N/A 07/31/2015   Procedure: COLONOSCOPY WITH PROPOFOL;  Surgeon: Bernette Redbird, MD;  Location: [facility];  Service: Endoscopy;  Laterality: N/A;   DIAGNOSTIC LAPAROSCOPY     dx lack of pregnancy    EYE SURGERY  lasik, cataract, prk,yag procedure   SOCIAL HISTORY: Social History   Socioeconomic History   Marital status:  Married    Spouse name: Not on file   Number of children: Not on file   Years of education: Not on file   Highest education level: Not on file  Occupational History   Not on file  Tobacco Use   Smoking status: Never   Smokeless tobacco: Never  Vaping Use   Vaping status: Never Used  Substance and Sexual Activity   Alcohol use: Yes    Alcohol/week: 0.0 standard drinks of alcohol    Comment: Rare   Drug use: No   Sexual activity: Not Currently  Other Topics Concern   Not on file  Social History Narrative   No regular exercise      Drinks lots of [employer]         Social Determinants of Health   Financial Resource Strain: Low Risk  (05/23/2023)   Overall Financial Resource Strain (CARDIA)    Difficulty of Paying Living Expenses: Not hard at all  Food Insecurity: No Food Insecurity (05/23/2023)   Hunger Vital Sign    Worried About Running Out of Food in the Last Year: Never true    Ran Out of Food in the Last Year: Never true  Transportation Needs: No Transportation Needs (05/23/2023)   PRAPARE - Administrator, Civil Service (Medical): No    Lack of Transportation (Non-Medical): No  Physical Activity: Inactive (05/23/2023)   Exercise Vital Sign    Days of Exercise per Week: 0 days    Minutes of Exercise per Session: 0 min  Stress: No Stress Concern Present (05/23/2023)   [employer] of Occupational Health - Occupational Stress Questionnaire    Feeling of Stress : Not at all  Social Connections: Moderately Integrated (05/23/2023)   Social Connection and Isolation Panel [NHANES]    Frequency of Communication with Friends and Family: More than three times a week    Frequency of Social Gatherings with Friends and Family: More than three times a week    Attends Religious Services: More than 4 times per year    Active Member of [employer] or Organizations: No    Attends Banker Meetings: Never    Marital Status: Married  Catering manager Violence: Not At  Risk (05/23/2023)   Humiliation, Afraid, Rape, and Kick questionnaire    Fear of Current or Ex-Partner: No    Emotionally Abused: No    Physically Abused: No    Sexually Abused: No   FAMILY HISTORY: Family History  Problem Relation Age of Onset   Osteoporosis Mother    Stroke Mother    Coronary artery disease Father    Stroke Father 61   Diabetes Other        Aunts and uncles   Breast cancer Paternal Aunt    Breast cancer Maternal Aunt    Arthritis/Rheumatoid Child    Breast cancer Cousin    ALLERGIES:  is allergic to codeine.  MEDICATIONS:  Current Outpatient Medications  Medication Sig Dispense Refill   acetaminophen (TYLENOL) 500 MG tablet Take 500-1,000 mg by mouth every 6 (six) hours as needed for mild pain or headache.     aspirin EC 81 MG tablet Take 81 mg by mouth at bedtime. Swallow whole.      Biotin 10 MG CAPS Take by mouth.     calcium-vitamin D (OSCAL WITH D) 500-5 MG-MCG tablet Take 1 tablet by mouth  daily with breakfast.     clobetasol (TEMOVATE) 0.05 % external solution Apply topically.     Darbepoetin Alfa (ARANESP) 500 MCG/ML SOSY injection Inject 500 mcg into the skin See admin instructions. Inject 500 mcg into the skin every week     desonide (DESOWEN) 0.05 % cream Apply topically.     gabapentin (NEURONTIN) 100 MG capsule Take 1 capsule by mouth at bedtime.     hydroxychloroquine (PLAQUENIL) 200 MG tablet Take 200 mg by mouth 2 (two) times daily.     levothyroxine (SYNTHROID) 25 MCG tablet TAKE ONE TAB BY MOUTH ONCE DAILY. TAKE ON AN EMPTY STOMACH WITH A GLASS OF WATER ATLEAST 30-60 MINUTES BEFORE BREAKFAST 90 tablet 3   loratadine (CLARITIN) 10 MG tablet Take 10 mg by mouth daily.     luspatercept-aamt (REBLOZYL) 75 MG subcutaneous injection Inject into the skin.     ondansetron (ZOFRAN ODT) 8 MG disintegrating tablet Take 1 tablet (8 mg total) by mouth every 8 (eight) hours as needed for nausea or vomiting. 30 tablet 0   Vitamin D, Ergocalciferol,  (DRISDOL) 1.25 MG (50000 UNIT) CAPS capsule TAKE ONE CAPSULE BY MOUTH ONCE A WEEK 12 capsule 1   No current facility-administered medications for this visit.   Facility-Administered Medications Ordered in Other Visits  Medication Dose Route Frequency Provider Last Rate Last Admin   luspatercept-aamt (REBLOZYL) subcutaneous injection 70 mg  1 mg/kg (Treatment Plan Recorded) Subcutaneous Once Earna Coder, MD       PHYSICAL EXAMINATION: Vitals:   08/18/23 1058  BP: 133/70  Pulse: 69  Temp: (!) 97.3 F (36.3 C)  SpO2: 99%   Filed Weights   08/18/23 1058  Weight: 157 lb (71.2 kg)   Physical Exam Constitutional:      Appearance: She is not ill-appearing.  HENT:     Head: Normocephalic and atraumatic.  Cardiovascular:     Rate and Rhythm: Normal rate and regular rhythm.  Pulmonary:     Effort: Pulmonary effort is normal. No respiratory distress.     Breath sounds: Normal breath sounds. No wheezing.  Abdominal:     General: There is no distension.     Palpations: Abdomen is soft.     Tenderness: There is no guarding.  Musculoskeletal:     Cervical back: No tenderness.     Comments: ambulatory  Skin:    General: Skin is warm.     Coloration: Skin is not pale.  Neurological:     Mental Status: She is alert and oriented to person, place, and time.  Psychiatric:        Mood and Affect: Mood and affect normal.        Behavior: Behavior normal.    LABORATORY DATA:  I have reviewed the data as listed Lab Results  Component Value Date   WBC 14.2 (H) 07/28/2023   HGB 10.6 (L) 07/28/2023   HCT 33.5 (L) 07/28/2023   MCV 96.3 07/28/2023   PLT 286 07/28/2023   Recent Labs    06/15/23 1233 07/05/23 0952 07/06/23 1012 07/28/23 1500  NA 136 134* 139 138  K 4.1 3.8 4.5 4.1  CL 106 104 103 105  CO2 24 21* 28 26  GLUCOSE 109* 165* 94 125*  BUN 24* 26* 20 20  CREATININE 0.99 1.20* 1.00 1.12*  CALCIUM 9.1 9.0 10.0 9.3  GFRNONAA >60 48*  --  52*  PROT 7.9 7.9 7.5  7.8  ALBUMIN 4.3 4.3 4.4 4.3  AST 22 24  16 20  ALT 15 16 13 17   ALKPHOS 42 42 46 46  BILITOT 0.7 0.6 0.8 0.6   MM 3D SCREENING MAMMOGRAM BILATERAL BREAST  Result Date: 07/28/2023 CLINICAL DATA:  Screening. EXAM: DIGITAL SCREENING BILATERAL MAMMOGRAM WITH TOMOSYNTHESIS AND CAD TECHNIQUE: Bilateral screening digital craniocaudal and mediolateral oblique mammograms were obtained. Bilateral screening digital breast tomosynthesis was performed. The images were evaluated with computer-aided detection. COMPARISON:  Previous exam(s). ACR Breast Density Category b: There are scattered areas of fibroglandular density. FINDINGS: There are no findings suspicious for malignancy. IMPRESSION: No mammographic evidence of malignancy. A result letter of this screening mammogram will be mailed directly to the patient. RECOMMENDATION: Screening mammogram in one year. (Code:SM-B-01Y) BI-RADS CATEGORY  1: Negative. Electronically Signed   By: Amie Portland M.D.   On: 07/28/2023 15:00     Assessment & Plan:   # Low grade MDS- with ringed sideroblasts; no blasts. POSITIVE  For SF3B1; R-IPSS-low risk. MARCH 2023-bone marrow biopsy no evidence of obvious progression of MDS or any acute process.  Currently on Aranesp 100 mcg every 2-3 week plus Luspatercept.    # proceed with Luspatercept # 22 today. Continue with Aranesp prn for Hb < 10. Today- Hb 11.2 today. Hold aranesp.    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 mprovement with claritin. S/p  appt with [facility] rheumatology; OFF cymblata [constipation]/nausea; awaiting on PT/water aerobics- stable.     # vit D def- FEB 2024-Vitamin D- 25/low-  on Vit D 50-continue vitamin D weekly. JUNE 2024-  Vit D-40- stable.     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  ? Etiology- poor tolerance to cymablta. S/p evaluation with Dr.Vaslow.  currently waiting on gabapentin  [Dr.Patel]- Monitor for now   # CKD- stage III- 50s- monitor for now-stable.   # Vaccination: s/p flu shot. Consider/recommend COVID/ RSV-    # handicapped parking.    Disposition:  Luspatercept today. Hold aranesp.  # follow up in 3 weeks-- labs CBC/cmp, ldh; Dr Donneta Romberg, +/- aranesp, luspatercept sq  No problem-specific Assessment & Plan notes found for this encounter.  All questions were answered. The patient knows to call the clinic with any problems, questions or concerns.   Alinda Dooms, NP 08/18/2023

## 2023-08-22 ENCOUNTER — Encounter: Payer: Self-pay | Admitting: Internal Medicine

## 2023-09-06 ENCOUNTER — Other Ambulatory Visit: Payer: Self-pay | Admitting: *Deleted

## 2023-09-06 DIAGNOSIS — D46Z Other myelodysplastic syndromes: Secondary | ICD-10-CM

## 2023-09-07 ENCOUNTER — Inpatient Hospital Stay: Payer: PPO | Attending: Internal Medicine

## 2023-09-07 ENCOUNTER — Encounter: Payer: Self-pay | Admitting: Internal Medicine

## 2023-09-07 ENCOUNTER — Inpatient Hospital Stay: Payer: PPO

## 2023-09-07 ENCOUNTER — Inpatient Hospital Stay (HOSPITAL_BASED_OUTPATIENT_CLINIC_OR_DEPARTMENT_OTHER): Payer: PPO | Admitting: Internal Medicine

## 2023-09-07 VITALS — BP 136/69 | HR 76 | Temp 97.6°F | Resp 19 | Wt 154.2 lb

## 2023-09-07 DIAGNOSIS — D46Z Other myelodysplastic syndromes: Secondary | ICD-10-CM

## 2023-09-07 DIAGNOSIS — D461 Refractory anemia with ring sideroblasts: Secondary | ICD-10-CM | POA: Diagnosis not present

## 2023-09-07 LAB — CMP (CANCER CENTER ONLY)
ALT: 17 U/L (ref 0–44)
AST: 24 U/L (ref 15–41)
Albumin: 4.2 g/dL (ref 3.5–5.0)
Alkaline Phosphatase: 41 U/L (ref 38–126)
Anion gap: 10 (ref 5–15)
BUN: 21 mg/dL (ref 8–23)
CO2: 23 mmol/L (ref 22–32)
Calcium: 9.4 mg/dL (ref 8.9–10.3)
Chloride: 104 mmol/L (ref 98–111)
Creatinine: 1.02 mg/dL — ABNORMAL HIGH (ref 0.44–1.00)
GFR, Estimated: 58 mL/min — ABNORMAL LOW (ref >60)
Glucose, Bld: 141 mg/dL — ABNORMAL HIGH (ref 70–99)
Potassium: 3.9 mmol/L (ref 3.5–5.1)
Sodium: 137 mmol/L (ref 135–145)
Total Bilirubin: 0.6 mg/dL (ref <1.2)
Total Protein: 7.8 g/dL (ref 6.5–8.1)

## 2023-09-07 LAB — CBC WITH DIFFERENTIAL/PLATELET
Abs Immature Granulocytes: 0.56 10*3/uL — ABNORMAL HIGH (ref 0.00–0.07)
Basophils Absolute: 0.2 10*3/uL — ABNORMAL HIGH (ref 0.0–0.1)
Basophils Relative: 2 %
Eosinophils Absolute: 0.1 10*3/uL (ref 0.0–0.5)
Eosinophils Relative: 1 %
HCT: 35.3 % — ABNORMAL LOW (ref 36.0–46.0)
Hemoglobin: 11.2 g/dL — ABNORMAL LOW (ref 12.0–15.0)
Immature Granulocytes: 5 %
Lymphocytes Relative: 22 %
Lymphs Abs: 2.4 10*3/uL (ref 0.7–4.0)
MCH: 29.9 pg (ref 26.0–34.0)
MCHC: 31.7 g/dL (ref 30.0–36.0)
MCV: 94.1 fL (ref 80.0–100.0)
Monocytes Absolute: 1.6 10*3/uL — ABNORMAL HIGH (ref 0.1–1.0)
Monocytes Relative: 14 %
Neutro Abs: 6.2 10*3/uL (ref 1.7–7.7)
Neutrophils Relative %: 56 %
Platelets: 236 10*3/uL (ref 150–400)
RBC: 3.75 MIL/uL — ABNORMAL LOW (ref 3.87–5.11)
RDW: 28.4 % — ABNORMAL HIGH (ref 11.5–15.5)
WBC: 11.1 10*3/uL — ABNORMAL HIGH (ref 4.0–10.5)
nRBC: 0.8 % — ABNORMAL HIGH (ref 0.0–0.2)

## 2023-09-07 LAB — LACTATE DEHYDROGENASE: LDH: 143 U/L (ref 98–192)

## 2023-09-07 MED ORDER — LUSPATERCEPT-AAMT 75 MG ~~LOC~~ SOLR
1.0000 mg/kg | Freq: Once | SUBCUTANEOUS | Status: AC
Start: 2023-09-07 — End: 2023-09-07
  Administered 2023-09-07: 70 mg via SUBCUTANEOUS
  Filled 2023-09-07: qty 1.4

## 2023-09-07 NOTE — Assessment & Plan Note (Addendum)
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 MARCH 2023-bone marrow biopsy no evidence of obvious progression of MDS or any acute process.  Currently on Aranesp 100 mcg every 2-3 week plus Luspatercept.   # proceed with Luspatercept # 23 today  Continue with Aranesp prn for Hb < 10- stable.   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 mprovement with claritin. S/p  appt with [facility] rheumatology; OFF cymblata [constipation]/nausea; awaiting on PT/water aerobics- stable.    # vit D def- FEB 2024-Vitamin D- 25/low-  on Vit D 50-continue vitamin D weekly. OCT 2024-  Vit D-34-recommend every other week.stable.     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  ? Etiology- poor tolerance to cymablta. S/p evaluation with Dr.Vaslow.  currently waiting on gabapentin [Dr.Patel]- Monitor for now- stable.    # CKD- stage III- 50s- monitor for now-stable.  # Vaccination: s/p flu shot. Consider/recommend COVID/ RSV- stable.  # handicapped parking.   # DISPOSITION:  # HOLD Aranesp today # today Luspatecept  # follow up in 4 weeks--MD-  labs CBC/cmp;iron studies; ferritin-  LDH;  possible aranesp; Luspatercept SQ-  Dr.B

## 2023-09-07 NOTE — Progress Notes (Signed)
[facility] CONSULT NOTE  Patient Care Team: [name], Audrie Gallus, MD as PCP - General Donneta Romberg, Worthy Flank, MD as Consulting Physician (Hematology and Oncology)  CHIEF COMPLAINTS/PURPOSE OF CONSULTATION: MDS   Oncology History Overview Note   # July 2020-myelodysplastic syndrome with ringed sideroblasts-Normal karyotype; no blasts [IPSS R-Very low risk ~median survival 8.3 years]; iron studies B12 folic acid myeloma panel normal; pyridoxine levels/copper/zinc-WNL. Erythropoietin levels-60. II OPINION at [facility] [Dr.DeCastro]  # [facility]/ NGS: TET2([id number])c.2524delT(p.Ser842GlnfsTer31) Exon 3 frame-shift SF3B1([id number])c.2098A>G(p.Lys700Glu) Exon 15 missense  DNMT3A([id number])c.2204A>G(p.Tyr735Cys) Exon 19 missense   # JAN 11th 2020- Aranesp/retacrit;  # July 30th, 2021- START REVLIMID 10 mg/day  [SF3B45mutation]  MARCH 2023- BONE MARROW, ASPIRATE, CLOT, CORE:  -  Hypercellular marrow involved by myelodysplastic syndrome with ring  sideroblasts (MDS-RS)  -  Polytypic plasmacytosis   # MAR[facility] 2023-increase the frequency of Aranesp 400 mcg every 2 weeks; continue Revlimid.   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; September 2021 ejection fraction 60 to 65%;   # colonoscopy- 2016 [Dr.Bucinni];  DIAGNOSIS: MDS/low-grade with ringed sideroblasts  STAGE: Low       ;  GOALS: Control  CURRENT/MOST RECENT THERAPY : EPO agent+ REV    MDS (myelodysplastic syndrome), low grade (HCC)  04/23/2019 Initial Diagnosis   MDS (myelodysplastic syndrome), low grade (HCC)   05/11/2022 - 05/11/2022 Chemotherapy   Patient is on Treatment Plan : MYELODYSPLASIA Luspatercept q21d     05/11/2022 -  Chemotherapy   Patient is on Treatment Plan : MYELODYSPLASIA Luspatercept q21d      HISTORY OF PRESENTING ILLNESS: Accompanied by husband.  Ambulating independently.  Carla [name] history of low-grade MDS  anemia with ring sideroblasts currently on Aranesp/Luspatercept is here for follow-up.    In the interim evaluated by neurology for neuropathy.patient g iven gabapentin by rheumatology, not started.    Patient continues to have occasional SOB; otherwise no worsening cough or leg swelling.  Appetite is fairly good. Still taking Calcium+D with the prescription Vitamin D.   Review of Systems  Constitutional:  Positive for malaise/fatigue. Negative for chills, diaphoresis and fever.  HENT:  Negative for nosebleeds and sore throat.   Eyes:  Negative for double vision.  Respiratory:  Negative for cough, hemoptysis, sputum production, shortness of breath and wheezing.   Cardiovascular:  Negative for chest pain and orthopnea.  Gastrointestinal:  Negative for abdominal pain, blood in stool, constipation, diarrhea, heartburn, melena and vomiting.  Genitourinary:  Negative for dysuria, frequency and urgency.  Musculoskeletal:  Positive for back pain and joint pain.  Skin: Negative.  Negative for itching and rash.  Neurological:  Negative for dizziness, tingling, focal weakness, weakness and headaches.  Endo/Heme/Allergies:  Does not bruise/bleed easily.  Psychiatric/Behavioral:  Negative for depression. The patient is not nervous/anxious and does not have insomnia.     MEDICAL HISTORY:  Past Medical History:  Diagnosis Date   Allergic rhinitis, cause unspecified    Anemia, unspecified    Carpal tunnel syndrome    Cystitis, unspecified    Family history of osteoporosis    PONV (postoperative nausea and vomiting)    Postnasal drip    Syncope and collapse     SURGICAL HISTORY: Past Surgical History:  Procedure Laterality Date   BONE MARROW BIOPSY     CARPAL TUNNEL RELEASE     COLONOSCOPY  09/28/2003   COLONOSCOPY WITH PROPOFOL N/A 07/31/2015   Procedure: COLONOSCOPY WITH PROPOFOL;  Surgeon: Bernette Redbird, MD;  Location: [facility];  Service: Endoscopy;  Laterality: N/A;   DIAGNOSTIC  LAPAROSCOPY     dx lack of pregnancy    EYE SURGERY     lasik, cataract, prk,yag procedure    SOCIAL HISTORY: Social History   Socioeconomic History   Marital status: Married    Spouse name: Not on file   Number of children: Not on file   Years of education: Not on file   Highest education level: Not on file  Occupational History   Not on file  Tobacco Use   Smoking status: Never   Smokeless tobacco: Never  Vaping Use   Vaping status: Never Used  Substance and Sexual Activity   Alcohol use: Yes    Alcohol/week: 0.0 standard drinks of alcohol    Comment: Rare   Drug use: No   Sexual activity: Not Currently  Other Topics Concern   Not on file  Social History Narrative   No regular exercise      Drinks lots of [employer]         Social Determinants of Health   Financial Resource Strain: Low Risk  (05/23/2023)   Overall Financial Resource Strain (CARDIA)    Difficulty of Paying Living Expenses: Not hard at all  Food Insecurity: No Food Insecurity (05/23/2023)   Hunger Vital Sign    Worried About Running Out of Food in the Last Year: Never true    Ran Out of Food in the Last Year: Never true  Transportation Needs: No Transportation Needs (05/23/2023)   PRAPARE - Administrator, Civil Service (Medical): No    Lack of Transportation (Non-Medical): No  Physical Activity: Inactive (05/23/2023)   Exercise Vital Sign    Days of Exercise per Week: 0 days    Minutes of Exercise per Session: 0 min  Stress: No Stress Concern Present (05/23/2023)   [employer] of Occupational Health - Occupational Stress Questionnaire    Feeling of Stress : Not at all  Social Connections: Moderately Integrated (05/23/2023)   Social Connection and Isolation Panel [NHANES]    Frequency of Communication with Friends and Family: More than three times a week    Frequency of Social Gatherings with Friends and Family: More than three times a week    Attends Religious Services: More than 4  times per year    Active Member of [employer] or Organizations: No    Attends Banker Meetings: Never    Marital Status: Married  Catering manager Violence: Not At Risk (05/23/2023)   Humiliation, Afraid, Rape, and Kick questionnaire    Fear of Current or Ex-Partner: No    Emotionally Abused: No    Physically Abused: No    Sexually Abused: No    FAMILY HISTORY: Family History  Problem Relation Age of Onset   Osteoporosis Mother    Stroke Mother    Coronary artery disease Father    Stroke Father 56   Diabetes Other        Aunts and uncles   Breast cancer Paternal Aunt    Breast cancer Maternal Aunt    Arthritis/Rheumatoid Child    Breast cancer Cousin     ALLERGIES:  is allergic to codeine.  MEDICATIONS:  Current Outpatient Medications  Medication Sig Dispense Refill   acetaminophen (TYLENOL) 500 MG tablet Take 500-1,000 mg by mouth every 6 (six) hours as needed for mild pain or headache.     aspirin EC 81 MG tablet  Take 81 mg by mouth at bedtime. Swallow whole.      Biotin 10 MG CAPS Take by mouth.     calcium-vitamin D (OSCAL WITH D) 500-5 MG-MCG tablet Take 1 tablet by mouth daily with breakfast.     clobetasol (TEMOVATE) 0.05 % external solution Apply topically.     Darbepoetin Alfa (ARANESP) 500 MCG/ML SOSY injection Inject 500 mcg into the skin See admin instructions. Inject 500 mcg into the skin every week     desonide (DESOWEN) 0.05 % cream Apply topically.     gabapentin (NEURONTIN) 100 MG capsule Take 1 capsule by mouth at bedtime.     levothyroxine (SYNTHROID) 25 MCG tablet TAKE ONE TAB BY MOUTH ONCE DAILY. TAKE ON AN EMPTY STOMACH WITH A GLASS OF WATER ATLEAST 30-60 MINUTES BEFORE BREAKFAST 90 tablet 3   loratadine (CLARITIN) 10 MG tablet Take 10 mg by mouth daily.     luspatercept-aamt (REBLOZYL) 75 MG subcutaneous injection Inject into the skin.     ondansetron (ZOFRAN ODT) 8 MG disintegrating tablet Take 1 tablet (8 mg total) by mouth every 8 (eight)  hours as needed for nausea or vomiting. 30 tablet 0   Vitamin D, Ergocalciferol, (DRISDOL) 1.25 MG (50000 UNIT) CAPS capsule TAKE ONE CAPSULE BY MOUTH ONCE A WEEK 12 capsule 1   hydroxychloroquine (PLAQUENIL) 200 MG tablet Take 200 mg by mouth 2 (two) times daily. (Patient not taking: Reported on 09/07/2023)     No current facility-administered medications for this visit.      PHYSICAL EXAMINATION:   Vitals:   09/07/23 1000  BP: 136/69  Pulse: 76  Resp: 19  Temp: 97.6 F (36.4 C)  SpO2: 96%      Filed Weights   09/07/23 1000  Weight: 154 lb 3.2 oz (69.9 kg)      Physical Exam HENT:     Head: Normocephalic and atraumatic.     Mouth/Throat:     Pharynx: No oropharyngeal exudate.  Eyes:     Pupils: Pupils are equal, round, and reactive to light.  Cardiovascular:     Rate and Rhythm: Normal rate and regular rhythm.     Pulses: Normal pulses.     Heart sounds: Normal heart sounds.  Pulmonary:     Effort: Pulmonary effort is normal. No respiratory distress.     Breath sounds: Normal breath sounds. No wheezing.  Abdominal:     General: Bowel sounds are normal. There is no distension.     Palpations: Abdomen is soft. There is no mass.     Tenderness: There is no abdominal tenderness. There is no guarding or rebound.  Musculoskeletal:        General: No tenderness. Normal range of motion.     Cervical back: Normal range of motion and neck supple.  Skin:    General: Skin is warm.  Neurological:     Mental Status: She is alert and oriented to person, place, and time.  Psychiatric:        Mood and Affect: Affect normal.     LABORATORY DATA:  I have reviewed the data as listed Lab Results  Component Value Date   WBC 11.1 (H) 09/07/2023   HGB 11.2 (L) 09/07/2023   HCT 35.3 (L) 09/07/2023   MCV 94.1 09/07/2023   PLT 236 09/07/2023   Recent Labs    07/28/23 1500 08/18/23 1048 09/07/23 0939  NA 138 140 137  K 4.1 4.4 3.9  CL 105 103 104  CO2 26  25 23   GLUCOSE 125* 128* 141*  BUN 20 23 21   CREATININE 1.12* 1.09* 1.02*  CALCIUM 9.3 9.0 9.4  GFRNONAA 52* 54* 58*  PROT 7.8 8.3* 7.8  ALBUMIN 4.3 4.4 4.2  AST 20 21 24   ALT 17 16 17   ALKPHOS 46 41 41  BILITOT 0.6 0.6 0.6     No results found.  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 POSITIVE  For SF3B1; R-IPSS-low risk. MARCH 2023-bone marrow biopsy no evidence of obvious progression of MDS or any acute process.  Currently on Aranesp 100 mcg every 2-3 week plus Luspatercept.   # proceed with Luspatercept # 23 today  Continue with Aranesp prn for Hb < 10- stable.   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 mprovement with claritin. S/p  appt with [facility] rheumatology; OFF cymblata [constipation]/nausea; awaiting on PT/water aerobics- stable.    # vit D def- FEB 2024-Vitamin D- 25/low-  on Vit D 50-continue vitamin D weekly. OCT 2024-  Vit D-34-recommend every other week.stable.     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  ? Etiology- poor tolerance to cymablta. S/p evaluation with Dr.Vaslow.  currently waiting on gabapentin [Dr.Patel]- Monitor for now- stable.    # CKD- stage III- 50s- monitor for now-stable.  # Vaccination: s/p flu shot. Consider/recommend COVID/ RSV- stable.  # handicapped parking.   # DISPOSITION:  # HOLD Aranesp today # today Luspatecept  # follow up in 4 weeks--MD-  labs CBC/cmp;iron studies; ferritin-  LDH;  possible aranesp; Luspatercept SQ-  Dr.B      All questions were answered. The patient knows to call the clinic with any problems, questions or concerns.   Earna Coder, MD 09/07/2023 1:04 PM

## 2023-09-07 NOTE — Progress Notes (Signed)
Patient has no concerns today. 

## 2023-09-13 ENCOUNTER — Other Ambulatory Visit: Payer: Self-pay | Admitting: Family Medicine

## 2023-09-15 ENCOUNTER — Ambulatory Visit
Admission: RE | Admit: 2023-09-15 | Discharge: 2023-09-15 | Disposition: A | Discharge status: Home / Self Care | Payer: PPO | Source: Ambulatory Visit | Attending: Family Medicine | Admitting: Family Medicine

## 2023-09-15 DIAGNOSIS — E2839 Other primary ovarian failure: Secondary | ICD-10-CM | POA: Diagnosis not present

## 2023-09-15 DIAGNOSIS — Z780 Asymptomatic menopausal state: Secondary | ICD-10-CM | POA: Diagnosis not present

## 2023-10-05 ENCOUNTER — Inpatient Hospital Stay: Payer: PPO | Admitting: Internal Medicine

## 2023-10-05 ENCOUNTER — Inpatient Hospital Stay: Payer: PPO

## 2023-10-05 ENCOUNTER — Inpatient Hospital Stay: Payer: PPO | Attending: Internal Medicine

## 2023-10-05 ENCOUNTER — Encounter: Payer: Self-pay | Admitting: Internal Medicine

## 2023-10-05 VITALS — BP 138/75 | HR 69 | Temp 97.2°F | Ht 63.75 in | Wt 157.2 lb

## 2023-10-05 DIAGNOSIS — L930 Discoid lupus erythematosus: Secondary | ICD-10-CM | POA: Diagnosis not present

## 2023-10-05 DIAGNOSIS — G629 Polyneuropathy, unspecified: Secondary | ICD-10-CM | POA: Insufficient documentation

## 2023-10-05 DIAGNOSIS — D46Z Other myelodysplastic syndromes: Secondary | ICD-10-CM | POA: Diagnosis not present

## 2023-10-05 DIAGNOSIS — E559 Vitamin D deficiency, unspecified: Secondary | ICD-10-CM | POA: Insufficient documentation

## 2023-10-05 DIAGNOSIS — D461 Refractory anemia with ring sideroblasts: Secondary | ICD-10-CM | POA: Diagnosis not present

## 2023-10-05 DIAGNOSIS — N183 Chronic kidney disease, stage 3 unspecified: Secondary | ICD-10-CM | POA: Diagnosis not present

## 2023-10-05 LAB — IRON AND TIBC
Iron: 140 ug/dL (ref 28–170)
Saturation Ratios: 44 % — ABNORMAL HIGH (ref 10.4–31.8)
TIBC: 316 ug/dL (ref 250–450)
UIBC: 176 ug/dL

## 2023-10-05 LAB — CBC WITH DIFFERENTIAL/PLATELET
Abs Immature Granulocytes: 0.91 10*3/uL — ABNORMAL HIGH (ref 0.00–0.07)
Basophils Absolute: 0.2 10*3/uL — ABNORMAL HIGH (ref 0.0–0.1)
Basophils Relative: 1 %
Eosinophils Absolute: 0.1 10*3/uL (ref 0.0–0.5)
Eosinophils Relative: 1 %
HCT: 34.9 % — ABNORMAL LOW (ref 36.0–46.0)
Hemoglobin: 11.3 g/dL — ABNORMAL LOW (ref 12.0–15.0)
Immature Granulocytes: 6 %
Lymphocytes Relative: 18 %
Lymphs Abs: 2.6 10*3/uL (ref 0.7–4.0)
MCH: 30.1 pg (ref 26.0–34.0)
MCHC: 32.4 g/dL (ref 30.0–36.0)
MCV: 92.8 fL (ref 80.0–100.0)
Monocytes Absolute: 2.1 10*3/uL — ABNORMAL HIGH (ref 0.1–1.0)
Monocytes Relative: 14 %
Neutro Abs: 8.7 10*3/uL — ABNORMAL HIGH (ref 1.7–7.7)
Neutrophils Relative %: 60 %
Platelets: 265 10*3/uL (ref 150–400)
RBC: 3.76 MIL/uL — ABNORMAL LOW (ref 3.87–5.11)
RDW: 28.1 % — ABNORMAL HIGH (ref 11.5–15.5)
Smear Review: ADEQUATE
WBC: 14.5 10*3/uL — ABNORMAL HIGH (ref 4.0–10.5)
nRBC: 1.2 % — ABNORMAL HIGH (ref 0.0–0.2)

## 2023-10-05 LAB — CMP (CANCER CENTER ONLY)
ALT: 18 U/L (ref 0–44)
AST: 22 U/L (ref 15–41)
Albumin: 4.5 g/dL (ref 3.5–5.0)
Alkaline Phosphatase: 42 U/L (ref 38–126)
Anion gap: 10 (ref 5–15)
BUN: 23 mg/dL (ref 8–23)
CO2: 23 mmol/L (ref 22–32)
Calcium: 9.4 mg/dL (ref 8.9–10.3)
Chloride: 103 mmol/L (ref 98–111)
Creatinine: 0.79 mg/dL (ref 0.44–1.00)
GFR, Estimated: >60 mL/min (ref >60)
Glucose, Bld: 158 mg/dL — ABNORMAL HIGH (ref 70–99)
Potassium: 4 mmol/L (ref 3.5–5.1)
Sodium: 136 mmol/L (ref 135–145)
Total Bilirubin: 0.7 mg/dL (ref 0.0–1.2)
Total Protein: 8.1 g/dL (ref 6.5–8.1)

## 2023-10-05 LAB — FERRITIN: Ferritin: 486 ng/mL — ABNORMAL HIGH (ref 11–307)

## 2023-10-05 LAB — LACTATE DEHYDROGENASE: LDH: 154 U/L (ref 98–192)

## 2023-10-05 MED ORDER — LUSPATERCEPT-AAMT 75 MG ~~LOC~~ SOLR
75.0000 mg | Freq: Once | SUBCUTANEOUS | Status: AC
Start: 2023-10-05 — End: 2023-10-05
  Administered 2023-10-05: 75 mg via SUBCUTANEOUS
  Filled 2023-10-05: qty 1.5

## 2023-10-05 MED ORDER — VITAMIN D (ERGOCALCIFEROL) 1.25 MG (50000 UNIT) PO CAPS: 50000.0000 [IU] | ORAL_CAPSULE | ORAL | 1 refills | Status: DC

## 2023-10-05 NOTE — Progress Notes (Signed)
 [facility] CONSULT NOTE  Patient Care Team: [name], [name], MD as PCP - General Rennie, Cindy SAUNDERS, MD as Consulting Physician (Hematology and Oncology)  CHIEF COMPLAINTS/PURPOSE OF CONSULTATION: MDS   Oncology History Overview Note   # July 2020-myelodysplastic syndrome with ringed sideroblasts-Normal karyotype; no blasts [IPSS R-Very low risk ~median survival 8.3 years]; iron studies B12 folic acid  myeloma panel normal; pyridoxine levels/copper /zinc -WNL. Erythropoietin  levels-60. II OPINION at [facility] [Dr.DeCastro]  # [facility]/ NGS: TET2([id number])c.2524delT(p.Ser842GlnfsTer31) Exon 3 frame-shift SF3B1([id number])c.2098A>G(p.Lys700Glu) Exon 15 missense  DNMT3A([id number])c.2204A>G(p.Tyr735Cys) Exon 19 missense   # JAN 11th 2020- Aranesp /retacrit;  # July 30th, 2021- START REVLIMID  10 mg/day  [SF3B70mutation]  MARCH 2023- BONE MARROW, ASPIRATE, CLOT, CORE:  -  Hypercellular marrow involved by myelodysplastic syndrome with ring  sideroblasts (MDS-RS)  -  Polytypic plasmacytosis   # MARCh 2023-increase the frequency of Aranesp  400 mcg every 2 weeks; continue Revlimid .   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 ; September 2021 ejection fraction 60 to 65%;   # colonoscopy- 2016 [Dr.Bucinni];  DIAGNOSIS: MDS/low-grade with ringed sideroblasts  STAGE: Low       ;  GOALS: Control  CURRENT/MOST RECENT THERAPY : EPO agent+ REV    MDS (myelodysplastic syndrome), low grade (HCC)  04/23/2019 Initial Diagnosis   MDS (myelodysplastic syndrome), low grade (HCC)   05/11/2022 - 05/11/2022 Chemotherapy   Patient is on Treatment Plan : MYELODYSPLASIA Luspatercept  q21d     05/11/2022 -  Chemotherapy   Patient is on Treatment Plan : MYELODYSPLASIA Luspatercept  q21d      HISTORY OF PRESENTING ILLNESS: Accompanied by husband.  Ambulating independently.  Carla [name] 73 y.o.  female history of low-grade MDS  anemia with ring sideroblasts currently on Aranesp /Luspatercept  is here for follow-up.    Patient complains of ongoing generalized arthritic pain.  Currently on gabapentin  100 mg a day.   Patient noted to have fullness right medial arm above elbow.  No pain or tenderness   Patient continues to have occasional SOB; otherwise no worsening cough or leg swelling.  Appetite is fairly good. Still taking Calcium +D with the prescription Vitamin D .   Review of Systems  Constitutional:  Positive for malaise/fatigue. Negative for chills, diaphoresis and fever.  HENT:  Negative for nosebleeds and sore throat.   Eyes:  Negative for double vision.  Respiratory:  Negative for cough, hemoptysis, sputum production, shortness of breath and wheezing.   Cardiovascular:  Negative for chest pain and orthopnea.  Gastrointestinal:  Negative for abdominal pain, blood in stool, constipation, diarrhea, heartburn, melena and vomiting.  Genitourinary:  Negative for dysuria, frequency and urgency.  Musculoskeletal:  Positive for back pain and joint pain.  Skin: Negative.  Negative for itching and rash.  Neurological:  Negative for dizziness, tingling, focal weakness, weakness and headaches.  Endo/Heme/Allergies:  Does not bruise/bleed easily.  Psychiatric/Behavioral:  Negative for depression. The patient is not nervous/anxious and does not have insomnia.     MEDICAL HISTORY:  Past Medical History:  Diagnosis Date   Allergic rhinitis, cause unspecified    Anemia, unspecified    Carpal tunnel syndrome    Cystitis, unspecified    Family history of osteoporosis    PONV (postoperative nausea and vomiting)    Postnasal drip    Syncope and collapse     SURGICAL HISTORY: Past Surgical History:  Procedure Laterality Date   BONE MARROW BIOPSY     CARPAL TUNNEL RELEASE     COLONOSCOPY  09/28/2003   COLONOSCOPY WITH PROPOFOL  N/A 07/31/2015   Procedure: COLONOSCOPY WITH PROPOFOL ;  Surgeon: Lamar Bunk, MD;   Location: [facility];  Service: Endoscopy;  Laterality: N/A;   DIAGNOSTIC LAPAROSCOPY     dx lack of pregnancy    EYE SURGERY     lasik, cataract, prk,yag procedure    SOCIAL HISTORY: Social History   Socioeconomic History   Marital status: Married    Spouse name: Not on file   Number of children: Not on file   Years of education: Not on file   Highest education level: Not on file  Occupational History   Not on file  Tobacco Use   Smoking status: Never   Smokeless tobacco: Never  Vaping Use   Vaping status: Never Used  Substance and Sexual Activity   Alcohol use: Yes    Alcohol/week: 0.0 standard drinks of alcohol    Comment: Rare   Drug use: No   Sexual activity: Not Currently  Other Topics Concern   Not on file  Social History Narrative   No regular exercise      Drinks lots of [employer]         Social Drivers of Health   Financial Resource Strain: Low Risk  (05/23/2023)   Overall Financial Resource Strain (CARDIA)    Difficulty of Paying Living Expenses: Not hard at all  Food Insecurity: No Food Insecurity (05/23/2023)   Hunger Vital Sign    Worried About Running Out of Food in the Last Year: Never true    Ran Out of Food in the Last Year: Never true  Transportation Needs: No Transportation Needs (05/23/2023)   PRAPARE - Administrator, Civil Service (Medical): No    Lack of Transportation (Non-Medical): No  Physical Activity: Inactive (05/23/2023)   Exercise Vital Sign    Days of Exercise per Week: 0 days    Minutes of Exercise per Session: 0 min  Stress: No Stress Concern Present (05/23/2023)   [employer] of Occupational Health - Occupational Stress Questionnaire    Feeling of Stress : Not at all  Social Connections: Moderately Integrated (05/23/2023)   Social Connection and Isolation Panel [NHANES]    Frequency of Communication with Friends and Family: More than three times a week    Frequency of Social Gatherings with Friends and Family:  More than three times a week    Attends Religious Services: More than 4 times per year    Active Member of [employer] or Organizations: No    Attends Banker Meetings: Never    Marital Status: Married  Catering Manager Violence: Not At Risk (05/23/2023)   Humiliation, Afraid, Rape, and Kick questionnaire    Fear of Current or Ex-Partner: No    Emotionally Abused: No    Physically Abused: No    Sexually Abused: No    FAMILY HISTORY: Family History  Problem Relation Age of Onset   Osteoporosis Mother    Stroke Mother    Coronary artery disease Father    Stroke Father 68   Diabetes Other        Aunts and uncles   Breast cancer Paternal Aunt    Breast cancer Maternal Aunt    Arthritis/Rheumatoid Child    Breast cancer Cousin     ALLERGIES:  is allergic to codeine.  MEDICATIONS:  Current Outpatient Medications  Medication Sig Dispense Refill   acetaminophen  (TYLENOL ) 500 MG tablet Take 500-1,000 mg by mouth every 6 (six) hours as  needed for mild pain or headache.     aspirin  EC 81 MG tablet Take 81 mg by mouth at bedtime. Swallow whole.      Biotin 10 MG CAPS Take by mouth.     calcium -vitamin D  (OSCAL WITH D) 500-5 MG-MCG tablet Take 1 tablet by mouth daily with breakfast.     clobetasol (TEMOVATE) 0.05 % external solution Apply topically.     Darbepoetin Alfa  (ARANESP ) 500 MCG/ML SOSY injection Inject 500 mcg into the skin See admin instructions. Inject 500 mcg into the skin every week     desonide (DESOWEN) 0.05 % cream Apply topically.     gabapentin  (NEURONTIN ) 100 MG capsule Take 1 capsule by mouth at bedtime.     levothyroxine  (SYNTHROID ) 25 MCG tablet TAKE ONE TAB BY MOUTH ONCE DAILY. TAKE ON AN EMPTY STOMACH WITH A GLASS OF WATER ATLEAST 30-60 MINUTES BEFORE BREAKFAST 90 tablet 3   loratadine (CLARITIN) 10 MG tablet Take 10 mg by mouth daily.     luspatercept -aamt (REBLOZYL ) 75 MG subcutaneous injection Inject into the skin.     ondansetron  (ZOFRAN  ODT) 8 MG  disintegrating tablet Take 1 tablet (8 mg total) by mouth every 8 (eight) hours as needed for nausea or vomiting. 30 tablet 0   hydroxychloroquine (PLAQUENIL) 200 MG tablet Take 200 mg by mouth 2 (two) times daily. (Patient not taking: Reported on 10/05/2023)     Vitamin D , Ergocalciferol , (DRISDOL ) 1.25 MG (50000 UNIT) CAPS capsule Take 1 capsule (50,000 Units total) by mouth once a week. 12 capsule 1   No current facility-administered medications for this visit.      PHYSICAL EXAMINATION:   Vitals:   10/05/23 0853  BP: 138/75  Pulse: 69  Temp: (!) 97.2 F (36.2 C)  SpO2: 98%      Filed Weights   10/05/23 0853  Weight: 157 lb 3.2 oz (71.3 kg)      Physical Exam HENT:     Head: Normocephalic and atraumatic.     Mouth/Throat:     Pharynx: No oropharyngeal exudate.  Eyes:     Pupils: Pupils are equal, round, and reactive to light.  Cardiovascular:     Rate and Rhythm: Normal rate and regular rhythm.     Pulses: Normal pulses.     Heart sounds: Normal heart sounds.  Pulmonary:     Effort: Pulmonary effort is normal. No respiratory distress.     Breath sounds: Normal breath sounds. No wheezing.  Abdominal:     General: Bowel sounds are normal. There is no distension.     Palpations: Abdomen is soft. There is no mass.     Tenderness: There is no abdominal tenderness. There is no guarding or rebound.  Musculoskeletal:        General: No tenderness. Normal range of motion.     Cervical back: Normal range of motion and neck supple.  Skin:    General: Skin is warm.  Neurological:     Mental Status: She is alert and oriented to person, place, and time.  Psychiatric:        Mood and Affect: Affect normal.     LABORATORY DATA:  I have reviewed the data as listed Lab Results  Component Value Date   WBC 14.5 (H) 10/05/2023   HGB 11.3 (L) 10/05/2023   HCT 34.9 (L) 10/05/2023   MCV 92.8 10/05/2023   PLT 265 10/05/2023   Recent Labs    08/18/23 1048 09/07/23 0939  10/05/23 0840  NA 140 137 136  K 4.4 3.9 4.0  CL 103 104 103  CO2 25 23 23   GLUCOSE 128* 141* 158*  BUN 23 21 23   CREATININE 1.09* 1.02* 0.79  CALCIUM  9.0 9.4 9.4  GFRNONAA 54* 58* >60  PROT 8.3* 7.8 8.1  ALBUMIN 4.4 4.2 4.5  AST 21 24 22   ALT 16 17 18   ALKPHOS 41 41 42  BILITOT 0.6 0.6 0.7     DG Bone Density Result Date: 09/15/2023 EXAM: DUAL X-RAY ABSORPTIOMETRY (DXA) FOR BONE MINERAL DENSITY IMPRESSION: Your patient Barbarita Nowak completed a BMD test on 09/15/2023 using the [employer] iDXA DXA System (software version: 14.10) manufactured by [employer]. The following summarizes the results of our evaluation. Technologist:VLM PATIENT BIOGRAPHICAL: Name: Dozal, Daysha P Patient ID: [id number] Birth Date: 10-06-1950 Height: 63.7 in. Gender: Female Exam Date: 09/15/2023 Weight: 154.2 lbs. Indications: Anticonvulsant, Caucasian, Height Loss, History of Chemo, Hypothyroid, myelodysplastic syndrome, Postmenopausal Fractures: Treatments: calcium  w/ vit D, Levothyroxine  DENSITOMETRY RESULTS: Site      Region     Measured Date Measured Age WHO Classification Young Adult T-score BMD         %Change vs. Previous Significant Change (*) AP Spine L1-L4 09[age]15/2022 73.3 Normal 1.2 1.349 g/cm2 2.4% Yes AP Spine L1-L4 12/31/2015 65.6 Normal 1.0 1.318 g/cm2 - - DualFemur Neck Right 09/15/2023 73.3 Normal -0.6 0.956 g/cm2 1.8% - DualFemur Neck Right 01/31/2018 67.6 Normal -0.7 0.939 g/cm2 -0.5% - DualFemur Neck Right 12/31/2015 65.6 Normal -0.7 0.944 g/cm2 - - DualFemur Total Mean 09/15/2023 73.3 Normal 0.0 1.006 g/cm2 -4.1% Yes DualFemur Total Mean 01/31/2018 67.6 Normal 0.3 1.049 g/cm2 0.6% - DualFemur Total Mean 12/31/2015 65.6 Normal 0.3 1.043 g/cm2 - - ASSESSMENT: The BMD measured at Femur Neck Right is 0.956 g/cm2 with a T-score of -0.6. This patient is considered normal according to World Health Organization [facility]) criteria. Compared with prior study, there has been significant increase in the spine.  Compared with prior study, there has been significant decrease in the total hip. The scan quality is good. World Science Writer [facility]) criteria for post-menopausal, Caucasian Women: Normal:                   T-score at or above -1 SD Osteopenia/low bone mass: T-score between -1 and -2.5 SD Osteoporosis:             T-score at or below -2.5 SD RECOMMENDATIONS: 1. All patients should optimize calcium  and vitamin D  intake. 2. Consider FDA-approved medical therapies in postmenopausal women and men aged 59 years and older, based on the following: a. A hip or vertebral(clinical or morphometric) fracture b. T-score < -2.5 at the femoral neck or spine after appropriate evaluation to exclude secondary causes c. Low bone mass (T-score between -1.0 and -2.5 at the femoral neck or spine) and a 10-year probability of a hip fracture > 3% or a 10-year probability of a major osteoporosis-related fracture > 20% based on the [place] -adapted WHO algorithm 3. Clinician judgment and/or patient preferences Wenger indicate treatment for people with 10-year fracture probabilities above or below these levels FOLLOW-UP: People with diagnosed cases of osteoporosis or at high risk for fracture should have regular bone mineral density tests. For patients eligible for Medicare, routine testing is allowed once every 2 years. The testing frequency can be increased to one year for patients who have rapidly progressing disease, those who are receiving or discontinuing medical therapy to restore bone mass, or have additional risk factors. I  have reviewed this report, and agree with the above findings. [facility] Electronically Signed   By: Norman Hopper M.D.   On: 09/15/2023 10:56    MDS (myelodysplastic syndrome), low grade (HCC)  # Low grade MDS- with ringed sideroblasts; no blasts. POSITIVE  For SF3B1; R-IPSS-low risk. MARCH 2023-bone marrow biopsy no evidence of obvious progression of MDS or any acute process.  Currently on  Aranesp  100 mcg every 2-3 week plus Luspatercept .   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 mprovement with claritin. S/p  appt with [facility] rheumatology; OFF cymblata [constipation]/nausea; awaiting on PT/water aerobics- stable.    # vit D def- FEB 2024-Vitamin D - 25/low-  on Vit D 50-continue vitamin D  weekly. OCT 2024-  Vit D-34-recommend every other week.stable.       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  ? Etiology- poor tolerance to cymablta. S/p evaluation with Dr.Vaslow.  currently waiting on gabapentin  [Dr.Patel]- Monitor for now- stable.    # CKD- stage III- 50s- monitor for now-stable.  # Vaccination: s/p flu shot. Consider/recommend COVID/ RSV- stable.  # handicapped parking.   # DISPOSITION:  # HOLD Aranesp  today # today Luspatecept  # follow up in 4 weeks--MD-  labs CBC/cmp;iron studies; ferritin-  LDH;  possible aranesp ; Luspatercept  SQ-  Dr.B       All questions were answered. The patient knows to call the clinic with any problems, questions or concerns.   Cindy JONELLE Joe, MD 10/05/2023 10:20 AM

## 2023-10-05 NOTE — Assessment & Plan Note (Addendum)
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 MARCH 2023-bone marrow biopsy no evidence of obvious progression of MDS or any acute process.  Currently on Aranesp  100 mcg every 2-3 week plus Luspatercept .   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 mprovement with claritin. S/p  appt with [facility] rheumatology; OFF cymblata [constipation]/nausea; awaiting on PT/water aerobics- stable.    # vit D def- FEB 2024-Vitamin D - 25/low-  on Vit D 50-continue vitamin D  weekly. OCT 2024-  Vit D-34-recommend every other week.stable.       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  ? Etiology- poor tolerance to cymablta. S/p evaluation with Dr.Vaslow.  currently waiting on gabapentin  [Dr.Patel]- Monitor for now- stable.    # CKD- stage III- 50s- monitor for now-stable.  # Vaccination: s/p flu shot. Consider/recommend COVID/ RSV- stable.  # handicapped parking.   # DISPOSITION:  # HOLD Aranesp  today # today Luspatecept  # follow up in 4 weeks--MD-  labs CBC/cmp;iron studies; ferritin-  LDH;  possible aranesp ; Luspatercept  SQ-  Dr.B

## 2023-10-05 NOTE — Progress Notes (Signed)
 C/o generalized arthritis pain, 8/10.  Refill Vit D, pended.  C/o a knot in the crease of her right arm, no pain or tenderness.

## 2023-10-21 DIAGNOSIS — M2550 Pain in unspecified joint: Secondary | ICD-10-CM | POA: Diagnosis not present

## 2023-10-21 DIAGNOSIS — M7061 Trochanteric bursitis, right hip: Secondary | ICD-10-CM | POA: Diagnosis not present

## 2023-10-21 DIAGNOSIS — M150 Primary generalized (osteo)arthritis: Secondary | ICD-10-CM | POA: Diagnosis not present

## 2023-10-21 DIAGNOSIS — M7062 Trochanteric bursitis, left hip: Secondary | ICD-10-CM | POA: Diagnosis not present

## 2023-10-21 DIAGNOSIS — M47816 Spondylosis without myelopathy or radiculopathy, lumbar region: Secondary | ICD-10-CM | POA: Diagnosis not present

## 2023-10-25 ENCOUNTER — Ambulatory Visit: Payer: PPO | Admitting: Internal Medicine

## 2023-10-25 ENCOUNTER — Other Ambulatory Visit: Payer: PPO

## 2023-10-25 ENCOUNTER — Ambulatory Visit: Payer: PPO

## 2023-11-02 ENCOUNTER — Encounter: Payer: Self-pay | Admitting: Internal Medicine

## 2023-11-02 ENCOUNTER — Inpatient Hospital Stay: Payer: PPO | Attending: Internal Medicine

## 2023-11-02 ENCOUNTER — Inpatient Hospital Stay: Payer: PPO

## 2023-11-02 ENCOUNTER — Inpatient Hospital Stay (HOSPITAL_BASED_OUTPATIENT_CLINIC_OR_DEPARTMENT_OTHER): Payer: PPO | Admitting: Internal Medicine

## 2023-11-02 ENCOUNTER — Other Ambulatory Visit: Payer: Self-pay

## 2023-11-02 VITALS — BP 107/74 | HR 72 | Temp 97.7°F | Ht 63.75 in | Wt 153.2 lb

## 2023-11-02 DIAGNOSIS — G629 Polyneuropathy, unspecified: Secondary | ICD-10-CM | POA: Diagnosis not present

## 2023-11-02 DIAGNOSIS — D46Z Other myelodysplastic syndromes: Secondary | ICD-10-CM

## 2023-11-02 DIAGNOSIS — Z803 Family history of malignant neoplasm of breast: Secondary | ICD-10-CM | POA: Diagnosis not present

## 2023-11-02 DIAGNOSIS — D461 Refractory anemia with ring sideroblasts: Secondary | ICD-10-CM | POA: Insufficient documentation

## 2023-11-02 DIAGNOSIS — E559 Vitamin D deficiency, unspecified: Secondary | ICD-10-CM | POA: Diagnosis not present

## 2023-11-02 DIAGNOSIS — D72829 Elevated white blood cell count, unspecified: Secondary | ICD-10-CM | POA: Insufficient documentation

## 2023-11-02 DIAGNOSIS — N183 Chronic kidney disease, stage 3 unspecified: Secondary | ICD-10-CM | POA: Diagnosis not present

## 2023-11-02 LAB — CBC WITH DIFFERENTIAL (CANCER CENTER ONLY)
Abs Immature Granulocytes: 0.58 10*3/uL — ABNORMAL HIGH (ref 0.00–0.07)
Band Neutrophils: 0 %
Basophils Absolute: 0.2 10*3/uL — ABNORMAL HIGH (ref 0.0–0.1)
Basophils Relative: 1 %
Blasts: 0 %
Eosinophils Absolute: 0 10*3/uL (ref 0.0–0.5)
Eosinophils Relative: 0 %
HCT: 33.3 % — ABNORMAL LOW (ref 36.0–46.0)
Hemoglobin: 10.8 g/dL — ABNORMAL LOW (ref 12.0–15.0)
Immature Granulocytes: 0 %
Lymphocytes Relative: 17 %
Lymphs Abs: 3.3 10*3/uL (ref 0.7–4.0)
MCH: 29.5 pg (ref 26.0–34.0)
MCHC: 32.4 g/dL (ref 30.0–36.0)
MCV: 91 fL (ref 80.0–100.0)
Metamyelocytes Relative: 2 %
Monocytes Absolute: 2.5 10*3/uL — ABNORMAL HIGH (ref 0.1–1.0)
Monocytes Relative: 13 %
Myelocytes: 1 %
Neutro Abs: 12.8 10*3/uL — ABNORMAL HIGH (ref 1.7–7.7)
Neutrophils Relative %: 66 %
Other: 0 %
Platelet Count: 246 10*3/uL (ref 150–400)
Promyelocytes Relative: 0 %
RBC: 3.66 MIL/uL — ABNORMAL LOW (ref 3.87–5.11)
RDW: 29.3 % — ABNORMAL HIGH (ref 11.5–15.5)
Smear Review: ADEQUATE
WBC Count: 19.4 10*3/uL — ABNORMAL HIGH (ref 4.0–10.5)
nRBC: 0 /100{WBCs}
nRBC: 0.6 % — ABNORMAL HIGH (ref 0.0–0.2)

## 2023-11-02 LAB — CMP (CANCER CENTER ONLY)
ALT: 17 U/L (ref 0–44)
AST: 22 U/L (ref 15–41)
Albumin: 4.3 g/dL (ref 3.5–5.0)
Alkaline Phosphatase: 43 U/L (ref 38–126)
Anion gap: 10 (ref 5–15)
BUN: 30 mg/dL — ABNORMAL HIGH (ref 8–23)
CO2: 22 mmol/L (ref 22–32)
Calcium: 9.1 mg/dL (ref 8.9–10.3)
Chloride: 101 mmol/L (ref 98–111)
Creatinine: 0.96 mg/dL (ref 0.44–1.00)
GFR, Estimated: >60 mL/min (ref >60)
Glucose, Bld: 150 mg/dL — ABNORMAL HIGH (ref 70–99)
Potassium: 4.1 mmol/L (ref 3.5–5.1)
Sodium: 133 mmol/L — ABNORMAL LOW (ref 135–145)
Total Bilirubin: 0.9 mg/dL (ref 0.0–1.2)
Total Protein: 7.9 g/dL (ref 6.5–8.1)

## 2023-11-02 LAB — LACTATE DEHYDROGENASE: LDH: 140 U/L (ref 98–192)

## 2023-11-02 LAB — FERRITIN: Ferritin: 517 ng/mL — ABNORMAL HIGH (ref 11–307)

## 2023-11-02 LAB — IRON AND TIBC
Iron: 152 ug/dL (ref 28–170)
Saturation Ratios: 45 % — ABNORMAL HIGH (ref 10.4–31.8)
TIBC: 335 ug/dL (ref 250–450)
UIBC: 183 ug/dL

## 2023-11-02 MED ORDER — LUSPATERCEPT-AAMT 75 MG ~~LOC~~ SOLR
75.0000 mg | Freq: Once | SUBCUTANEOUS | Status: AC
  Administered 2023-11-02: 75 mg via SUBCUTANEOUS
  Filled 2023-11-02: qty 1.5

## 2023-11-02 NOTE — Progress Notes (Signed)
 Pt saw [facility] Rheumatology Dr. Lydia Sams, received injections in both hips, pain is much better.   Pt states she had some heart palpitations x2 this past week, maybe some fluttering.

## 2023-11-02 NOTE — Progress Notes (Signed)
[facility] CONSULT NOTE  Patient Care Team: [name], Audrie Gallus, MD as PCP - General Donneta Romberg, Worthy Flank, MD as Consulting Physician (Hematology and Oncology)  CHIEF COMPLAINTS/PURPOSE OF CONSULTATION: MDS   Oncology History Overview Note   # July 2020-myelodysplastic syndrome with ringed sideroblasts-Normal karyotype; no blasts [IPSS R-Very low risk ~median survival 8.3 years]; iron studies B12 folic acid myeloma panel normal; pyridoxine levels/copper/zinc-WNL. Erythropoietin levels-60. II OPINION at [facility] [Dr.DeCastro]  # [facility]/ NGS: TET2([id number])c.2524delT(p.Ser842GlnfsTer31) Exon 3 frame-shift SF3B1([id number])c.2098A>G(p.Lys700Glu) Exon 15 missense  DNMT3A([id number])c.2204A>G(p.Tyr735Cys) Exon 19 missense   # JAN 11th 2020- Aranesp/retacrit;  # July 30th, 2021- START REVLIMID 10 mg/day  [SF3B37mutation]  MARCH 2023- BONE MARROW, ASPIRATE, CLOT, CORE:  -  Hypercellular marrow involved by myelodysplastic syndrome with ring  sideroblasts (MDS-RS)  -  Polytypic plasmacytosis   # MARCh 2023-increase the frequency of Aranesp 400 mcg every 2 weeks; continue Revlimid.   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; September 2021 ejection fraction 60 to 65%;   # colonoscopy- 2016 [Dr.Bucinni];  DIAGNOSIS: MDS/low-grade with ringed sideroblasts  STAGE: Low       ;  GOALS: Control  CURRENT/MOST RECENT THERAPY : EPO agent+ REV    MDS (myelodysplastic syndrome), low grade (HCC)  04/23/2019 Initial Diagnosis   MDS (myelodysplastic syndrome), low grade (HCC)   05/11/2022 - 05/11/2022 Chemotherapy   Patient is on Treatment Plan : MYELODYSPLASIA Luspatercept q21d     05/11/2022 -  Chemotherapy   Patient is on Treatment Plan : MYELODYSPLASIA Luspatercept q21d      HISTORY OF PRESENTING ILLNESS: Accompanied by husband.  Ambulating independently.  Carla [name] 72 y.o.  female history of low-grade MDS  anemia with ring sideroblasts currently on Aranesp/Luspatercept is here for follow-up.    Patient was recently evaluated by [facility] Rheumatology Dr. Allena Katz, received injections in both hips, pain is much better.   Patient also had intermittent heart palpitations currently resolved.  No chest pain.  Patient complains of ongoing generalized arthritic pain.  Currently on gabapentin 100 mg a day.  Patient continues to have occasional SOB; otherwise no worsening cough or leg swelling.  Appetite is fairly good. Still taking Calcium+D with the prescription Vitamin D.   Review of Systems  Constitutional:  Positive for malaise/fatigue. Negative for chills, diaphoresis and fever.  HENT:  Negative for nosebleeds and sore throat.   Eyes:  Negative for double vision.  Respiratory:  Negative for cough, hemoptysis, sputum production, shortness of breath and wheezing.   Cardiovascular:  Negative for chest pain and orthopnea.  Gastrointestinal:  Negative for abdominal pain, blood in stool, constipation, diarrhea, heartburn, melena and vomiting.  Genitourinary:  Negative for dysuria, frequency and urgency.  Musculoskeletal:  Positive for back pain and joint pain.  Skin: Negative.  Negative for itching and rash.  Neurological:  Negative for dizziness, tingling, focal weakness, weakness and headaches.  Endo/Heme/Allergies:  Does not bruise/bleed easily.  Psychiatric/Behavioral:  Negative for depression. The patient is not nervous/anxious and does not have insomnia.     MEDICAL HISTORY:  Past Medical History:  Diagnosis Date   Allergic rhinitis, cause unspecified    Anemia, unspecified    Carpal tunnel syndrome    Cystitis, unspecified    Family history of osteoporosis    PONV (postoperative nausea and vomiting)    Postnasal drip    Syncope and collapse     SURGICAL HISTORY: Past Surgical History:  Procedure Laterality Date  BONE MARROW BIOPSY     CARPAL TUNNEL RELEASE     COLONOSCOPY  09/28/2003    COLONOSCOPY WITH PROPOFOL N/A 07/31/2015   Procedure: COLONOSCOPY WITH PROPOFOL;  Surgeon: Bernette Redbird, MD;  Location: [facility];  Service: Endoscopy;  Laterality: N/A;   DIAGNOSTIC LAPAROSCOPY     dx lack of pregnancy    EYE SURGERY     lasik, cataract, prk,yag procedure    SOCIAL HISTORY: Social History   Socioeconomic History   Marital status: Married    Spouse name: Not on file   Number of children: Not on file   Years of education: Not on file   Highest education level: Not on file  Occupational History   Not on file  Tobacco Use   Smoking status: Never   Smokeless tobacco: Never  Vaping Use   Vaping status: Never Used  Substance and Sexual Activity   Alcohol use: Yes    Alcohol/week: 0.0 standard drinks of alcohol    Comment: Rare   Drug use: No   Sexual activity: Not Currently  Other Topics Concern   Not on file  Social History Narrative   No regular exercise      Drinks lots of [employer]         Social Drivers of Health   Financial Resource Strain: Low Risk  (05/23/2023)   Overall Financial Resource Strain (CARDIA)    Difficulty of Paying Living Expenses: Not hard at all  Food Insecurity: No Food Insecurity (05/23/2023)   Hunger Vital Sign    Worried About Running Out of Food in the Last Year: Never true    Ran Out of Food in the Last Year: Never true  Transportation Needs: No Transportation Needs (05/23/2023)   PRAPARE - Administrator, Civil Service (Medical): No    Lack of Transportation (Non-Medical): No  Physical Activity: Inactive (05/23/2023)   Exercise Vital Sign    Days of Exercise per Week: 0 days    Minutes of Exercise per Session: 0 min  Stress: No Stress Concern Present (05/23/2023)   [employer] of Occupational Health - Occupational Stress Questionnaire    Feeling of Stress : Not at all  Social Connections: Moderately Integrated (05/23/2023)   Social Connection and Isolation Panel [NHANES]    Frequency of Communication  with Friends and Family: More than three times a week    Frequency of Social Gatherings with Friends and Family: More than three times a week    Attends Religious Services: More than 4 times per year    Active Member of [employer] or Organizations: No    Attends Banker Meetings: Never    Marital Status: Married  Catering manager Violence: Not At Risk (05/23/2023)   Humiliation, Afraid, Rape, and Kick questionnaire    Fear of Current or Ex-Partner: No    Emotionally Abused: No    Physically Abused: No    Sexually Abused: No    FAMILY HISTORY: Family History  Problem Relation Age of Onset   Osteoporosis Mother    Stroke Mother    Coronary artery disease Father    Stroke Father 13   Diabetes Other        Aunts and uncles   Breast cancer Paternal Aunt    Breast cancer Maternal Aunt    Arthritis/Rheumatoid Child    Breast cancer Cousin     ALLERGIES:  is allergic to codeine.  MEDICATIONS:  Current Outpatient Medications  Medication Sig Dispense Refill  acetaminophen (TYLENOL) 500 MG tablet Take 500-1,000 mg by mouth every 6 (six) hours as needed for mild pain or headache.     aspirin EC 81 MG tablet Take 81 mg by mouth at bedtime. Swallow whole.      Biotin 10 MG CAPS Take by mouth.     calcium-vitamin D (OSCAL WITH D) 500-5 MG-MCG tablet Take 1 tablet by mouth daily with breakfast.     clobetasol (TEMOVATE) 0.05 % external solution Apply topically.     Darbepoetin Alfa (ARANESP) 500 MCG/ML SOSY injection Inject 500 mcg into the skin See admin instructions. Inject 500 mcg into the skin every week     desonide (DESOWEN) 0.05 % cream Apply topically.     gabapentin (NEURONTIN) 100 MG capsule Take 1 capsule by mouth at bedtime.     levothyroxine (SYNTHROID) 25 MCG tablet TAKE ONE TAB BY MOUTH ONCE DAILY. TAKE ON AN EMPTY STOMACH WITH A GLASS OF WATER ATLEAST 30-60 MINUTES BEFORE BREAKFAST 90 tablet 3   loratadine (CLARITIN) 10 MG tablet Take 10 mg by mouth daily.      luspatercept-aamt (REBLOZYL) 75 MG subcutaneous injection Inject into the skin.     ondansetron (ZOFRAN ODT) 8 MG disintegrating tablet Take 1 tablet (8 mg total) by mouth every 8 (eight) hours as needed for nausea or vomiting. 30 tablet 0   Vitamin D, Ergocalciferol, (DRISDOL) 1.25 MG (50000 UNIT) CAPS capsule Take 1 capsule (50,000 Units total) by mouth once a week. 12 capsule 1   No current facility-administered medications for this visit.      PHYSICAL EXAMINATION:   Vitals:   11/02/23 0953  BP: 107/74  Pulse: 72  Temp: 97.7 F (36.5 C)  SpO2: 99%      Filed Weights   11/02/23 0953  Weight: 153 lb 3.2 oz (69.5 kg)      Physical Exam HENT:     Head: Normocephalic and atraumatic.     Mouth/Throat:     Pharynx: No oropharyngeal exudate.  Eyes:     Pupils: Pupils are equal, round, and reactive to light.  Cardiovascular:     Rate and Rhythm: Normal rate and regular rhythm.     Pulses: Normal pulses.     Heart sounds: Normal heart sounds.  Pulmonary:     Effort: Pulmonary effort is normal. No respiratory distress.     Breath sounds: Normal breath sounds. No wheezing.  Abdominal:     General: Bowel sounds are normal. There is no distension.     Palpations: Abdomen is soft. There is no mass.     Tenderness: There is no abdominal tenderness. There is no guarding or rebound.  Musculoskeletal:        General: No tenderness. Normal range of motion.     Cervical back: Normal range of motion and neck supple.  Skin:    General: Skin is warm.  Neurological:     Mental Status: She is alert and oriented to person, place, and time.  Psychiatric:        Mood and Affect: Affect normal.     LABORATORY DATA:  I have reviewed the data as listed Lab Results  Component Value Date   WBC 19.4 (H) 11/02/2023   HGB 10.8 (L) 11/02/2023   HCT 33.3 (L) 11/02/2023   MCV 91.0 11/02/2023   PLT 246 11/02/2023   Recent Labs    09/07/23 0939 10/05/23 0840 11/02/23 0944  NA 137  136 133*  K 3.9 4.0 4.1  CL 104 103 101  CO2 23 23 22   GLUCOSE 141* 158* 150*  BUN 21 23 30*  CREATININE 1.02* 0.79 0.96  CALCIUM 9.4 9.4 9.1  GFRNONAA 58* >60 >60  PROT 7.8 8.1 7.9  ALBUMIN 4.2 4.5 4.3  AST 24 22 22   ALT 17 18 17   ALKPHOS 41 42 43  BILITOT 0.6 0.7 0.9     No results found.   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 POSITIVE  For SF3B1; R-IPSS-low risk. MARCH 2023-bone marrow biopsy no evidence of obvious progression of MDS or any acute process.  Currently on Aranesp 100 mcg every 2-3 week plus Luspatercept.   # proceed with Luspatercept # 25 today  Continue with Aranesp prn for Hb < 10- stable.[Staring JAN 2025] Will stretch q 4 weeks given the stability/Hemoglobin- is reasonable   # Leukocytosis-predominant neutrophilia immature cells noted-question related to recent steroid injection versus others.  Check review of smear and also peripheral blood flow cytometry at next visit.  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 S/p  appt with [facility] rheumatology -status post hip injections pain improved-  stable  # vit D def- FEB 2024-Vitamin D- 25/low-  on Vit D 50-continue vitamin D weekly. OCT 2024-  Vit D-34-recommend every other week.stable.       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  ? Etiology- poor tolerance to cymablta. S/p evaluation with Dr.Vaslow.  currently waiting on gabapentin [Dr.Patel]- Monitor for now- stable.    # CKD- stage III- 50s- monitor for now-stable.  # Vaccination: s/p flu shot. Consider/recommend COVID/ RSV- stable.  # handicapped parking.   # DISPOSITION:  # add review of smear- ordered # HOLD Aranesp today # today Luspatecept  # follow up in 4 weeks--MD-  labs CBC/cmp;  LDH; peripheral flow cytometry possible aranesp; Luspatercept SQ-  Dr.B       All  questions were answered. The patient knows to call the clinic with any problems, questions or concerns.   Earna Coder, MD 11/02/2023 11:25 AM

## 2023-11-02 NOTE — Assessment & Plan Note (Addendum)
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 MARCH 2023-bone marrow biopsy no evidence of obvious progression of MDS or any acute process.  Currently on Aranesp 100 mcg every 2-3 week plus Luspatercept.   # proceed with Luspatercept # 25 today  Continue with Aranesp prn for Hb < 10- stable.[Staring JAN 2025] Will stretch q 4 weeks given the stability/Hemoglobin- is reasonable   # Leukocytosis-predominant neutrophilia immature cells noted-question related to recent steroid injection versus others.  Check review of smear and also peripheral blood flow cytometry at next visit.  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 S/p  appt with [facility] rheumatology -status post hip injections pain improved-  stable  # vit D def- FEB 2024-Vitamin D- 25/low-  on Vit D 50-continue vitamin D weekly. OCT 2024-  Vit D-34-recommend every other week.stable.       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  ? Etiology- poor tolerance to cymablta. S/p evaluation with Dr.Vaslow.  currently waiting on gabapentin [Dr.Patel]- Monitor for now- stable.    # CKD- stage III- 50s- monitor for now-stable.  # Vaccination: s/p flu shot. Consider/recommend COVID/ RSV- stable.  # handicapped parking.   # DISPOSITION:  # add review of smear- ordered # HOLD Aranesp today # today Luspatecept  # follow up in 4 weeks--MD-  labs CBC/cmp;  LDH; peripheral flow cytometry possible aranesp; Luspatercept SQ-  Dr.B

## 2023-11-29 ENCOUNTER — Inpatient Hospital Stay: Payer: PPO

## 2023-11-29 ENCOUNTER — Inpatient Hospital Stay: Payer: PPO | Attending: Internal Medicine

## 2023-11-29 ENCOUNTER — Encounter: Payer: Self-pay | Admitting: Internal Medicine

## 2023-11-29 ENCOUNTER — Inpatient Hospital Stay: Payer: PPO | Admitting: Internal Medicine

## 2023-11-29 VITALS — BP 102/58 | HR 73 | Temp 96.8°F | Resp 20 | Ht 63.5 in | Wt 153.0 lb

## 2023-11-29 DIAGNOSIS — D46Z Other myelodysplastic syndromes: Secondary | ICD-10-CM | POA: Diagnosis not present

## 2023-11-29 DIAGNOSIS — G629 Polyneuropathy, unspecified: Secondary | ICD-10-CM | POA: Diagnosis not present

## 2023-11-29 DIAGNOSIS — D461 Refractory anemia with ring sideroblasts: Secondary | ICD-10-CM | POA: Diagnosis not present

## 2023-11-29 DIAGNOSIS — Z803 Family history of malignant neoplasm of breast: Secondary | ICD-10-CM | POA: Diagnosis not present

## 2023-11-29 DIAGNOSIS — E559 Vitamin D deficiency, unspecified: Secondary | ICD-10-CM | POA: Insufficient documentation

## 2023-11-29 DIAGNOSIS — R002 Palpitations: Secondary | ICD-10-CM | POA: Diagnosis not present

## 2023-11-29 DIAGNOSIS — N183 Chronic kidney disease, stage 3 unspecified: Secondary | ICD-10-CM | POA: Insufficient documentation

## 2023-11-29 DIAGNOSIS — Z79899 Other long term (current) drug therapy: Secondary | ICD-10-CM | POA: Insufficient documentation

## 2023-11-29 DIAGNOSIS — D72829 Elevated white blood cell count, unspecified: Secondary | ICD-10-CM | POA: Insufficient documentation

## 2023-11-29 LAB — CMP (CANCER CENTER ONLY)
ALT: 16 U/L (ref 0–44)
AST: 22 U/L (ref 15–41)
Albumin: 4.4 g/dL (ref 3.5–5.0)
Alkaline Phosphatase: 45 U/L (ref 38–126)
Anion gap: 11 (ref 5–15)
BUN: 21 mg/dL (ref 8–23)
CO2: 23 mmol/L (ref 22–32)
Calcium: 9.2 mg/dL (ref 8.9–10.3)
Chloride: 103 mmol/L (ref 98–111)
Creatinine: 1.16 mg/dL — ABNORMAL HIGH (ref 0.44–1.00)
GFR, Estimated: 50 mL/min — ABNORMAL LOW (ref >60)
Glucose, Bld: 158 mg/dL — ABNORMAL HIGH (ref 70–99)
Potassium: 4.3 mmol/L (ref 3.5–5.1)
Sodium: 137 mmol/L (ref 135–145)
Total Bilirubin: 1.1 mg/dL (ref 0.0–1.2)
Total Protein: 7.9 g/dL (ref 6.5–8.1)

## 2023-11-29 LAB — CBC WITH DIFFERENTIAL/PLATELET
Abs Immature Granulocytes: 0.79 10*3/uL — ABNORMAL HIGH (ref 0.00–0.07)
Basophils Absolute: 0.2 10*3/uL — ABNORMAL HIGH (ref 0.0–0.1)
Basophils Relative: 1 %
Eosinophils Absolute: 0.1 10*3/uL (ref 0.0–0.5)
Eosinophils Relative: 1 %
HCT: 34.7 % — ABNORMAL LOW (ref 36.0–46.0)
Hemoglobin: 11 g/dL — ABNORMAL LOW (ref 12.0–15.0)
Immature Granulocytes: 5 %
Lymphocytes Relative: 10 %
Lymphs Abs: 1.5 10*3/uL (ref 0.7–4.0)
MCH: 30.2 pg (ref 26.0–34.0)
MCHC: 31.7 g/dL (ref 30.0–36.0)
MCV: 95.3 fL (ref 80.0–100.0)
Monocytes Absolute: 2.6 10*3/uL — ABNORMAL HIGH (ref 0.1–1.0)
Monocytes Relative: 17 %
Neutro Abs: 10.4 10*3/uL — ABNORMAL HIGH (ref 1.7–7.7)
Neutrophils Relative %: 66 %
Platelets: 305 10*3/uL (ref 150–400)
RBC: 3.64 MIL/uL — ABNORMAL LOW (ref 3.87–5.11)
RDW: 30.5 % — ABNORMAL HIGH (ref 11.5–15.5)
Smear Review: ADEQUATE
WBC: 15.5 10*3/uL — ABNORMAL HIGH (ref 4.0–10.5)
nRBC: 1.2 % — ABNORMAL HIGH (ref 0.0–0.2)

## 2023-11-29 LAB — LACTATE DEHYDROGENASE: LDH: 147 U/L (ref 98–192)

## 2023-11-29 MED ORDER — LUSPATERCEPT-AAMT 75 MG ~~LOC~~ SOLR
1.1000 mg/kg | Freq: Once | SUBCUTANEOUS | Status: AC
  Administered 2023-11-29: 75 mg via SUBCUTANEOUS
  Filled 2023-11-29: qty 1.5

## 2023-11-29 NOTE — Progress Notes (Signed)
 C/o heart flutter/palpations last week or two. No chest pains.

## 2023-11-29 NOTE — Assessment & Plan Note (Addendum)
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 MARCH 2023-bone marrow biopsy no evidence of obvious progression of MDS or any acute process.  Currently on Aranesp 100 mcg every 2-3 week plus Luspatercept.   # proceed with Luspatercept # 26 today  Continue with Aranesp prn for Hb < 10- stable.[Staring JAN 2025] Will stretch q 4 weeks given the stability/Hemoglobin- is reasonable   # Palpitations: [Dr.Gollan /sp work up 2023- Neg]-intermittent- consider OTC devices for monitoring- currently NSR.   # Leukocytosis-predominant neutrophilia immature cells noted-question related to recent steroid injection versus others. Awaiting peripheral blood flow cytometry  today.   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 S/p  appt with [facility] rheumatology -status post hip injections pain improved-  stable  # vit D def- FEB 2024-Vitamin D- 25/low-  on Vit D 50-continue vitamin D weekly. OCT 2024-  Vit D-34-recommend every other week. stable       # Peripheral neuropathy-M-proetin- 2020- NEG.  ? Etiology- poor tolerance to cymablta. S/p evaluation with Dr.Vaslow.  currently waiting on gabapentin [Dr.Patel]- Monitor for now-  stable  # CKD- stage III- 50s- monitor for now- stable  # Vaccination: s/p flu shot. Consider/recommend COVID/ RSV- stable.  # handicapped parking.   # DISPOSITION:  # HOLD Aranesp today # today Luspatecept  # follow up in 4 weeks--MD-  labs CBC/cmp;  LDH;  possible aranesp; Luspatercept SQ-  Dr.B

## 2023-11-29 NOTE — Progress Notes (Signed)
 [facility] CONSULT NOTE  Patient Care Team: [name], Audrie Gallus, MD as PCP - General Donneta Romberg, Worthy Flank, MD as Consulting Physician (Hematology and Oncology)  CHIEF COMPLAINTS/PURPOSE OF CONSULTATION: MDS   Oncology History Overview Note   # July 2020-myelodysplastic syndrome with ringed sideroblasts-Normal karyotype; no blasts [IPSS R-Very low risk ~median survival 8.3 years]; iron studies B12 folic acid myeloma panel normal; pyridoxine levels/copper/zinc-WNL. Erythropoietin levels-60. II OPINION at [facility] [Dr.DeCastro]  # [facility]/ NGS: TET2([id number])c.2524delT(p.Ser842GlnfsTer31) Exon 3 frame-shift SF3B1([id number])c.2098A>G(p.Lys700Glu) Exon 15 missense  DNMT3A([id number])c.2204A>G(p.Tyr735Cys) Exon 19 missense   # JAN 11th 2020- Aranesp/retacrit;  # July 30th, 2021- START REVLIMID 10 mg/day  [SF3B44mutation]  MARCH 2023- BONE MARROW, ASPIRATE, CLOT, CORE:  -  Hypercellular marrow involved by myelodysplastic syndrome with ring  sideroblasts (MDS-RS)  -  Polytypic plasmacytosis   # MARCh 2023-increase the frequency of Aranesp 400 mcg every 2 weeks; continue Revlimid.   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; September 2021 ejection fraction 60 to 65%;   # colonoscopy- 2016 [Dr.Bucinni];  DIAGNOSIS: MDS/low-grade with ringed sideroblasts  STAGE: Low       ;  GOALS: Control  CURRENT/MOST RECENT THERAPY : EPO agent+ REV    MDS (myelodysplastic syndrome), low grade (HCC)  04/23/2019 Initial Diagnosis   MDS (myelodysplastic syndrome), low grade (HCC)   05/11/2022 - 05/11/2022 Chemotherapy   Patient is on Treatment Plan : MYELODYSPLASIA Luspatercept q21d     05/11/2022 -  Chemotherapy   Patient is on Treatment Plan : MYELODYSPLASIA Luspatercept q21d      HISTORY OF PRESENTING ILLNESS: Accompanied by husband.  Ambulating independently.  Carla [name] 73 y.o.  female history of low-grade MDS  anemia with ring sideroblasts currently on Aranesp/Luspatercept is here for follow-up.    C/o heart flutter/palpations last week or two.  Currently resolved.  No chest pains.    Patient continues to have occasional SOB; otherwise no worsening cough or leg swelling.  Patient complains of ongoing generalized arthritic pain.  Currently on gabapentin 100 mg a day.   Appetite is fairly good. Still taking Calcium+D with the prescription Vitamin D.   Review of Systems  Constitutional:  Positive for malaise/fatigue. Negative for chills, diaphoresis and fever.  HENT:  Negative for nosebleeds and sore throat.   Eyes:  Negative for double vision.  Respiratory:  Negative for cough, hemoptysis, sputum production, shortness of breath and wheezing.   Cardiovascular:  Negative for chest pain and orthopnea.  Gastrointestinal:  Negative for abdominal pain, blood in stool, constipation, diarrhea, heartburn, melena and vomiting.  Genitourinary:  Negative for dysuria, frequency and urgency.  Musculoskeletal:  Positive for back pain and joint pain.  Skin: Negative.  Negative for itching and rash.  Neurological:  Negative for dizziness, tingling, focal weakness, weakness and headaches.  Endo/Heme/Allergies:  Does not bruise/bleed easily.  Psychiatric/Behavioral:  Negative for depression. The patient is not nervous/anxious and does not have insomnia.     MEDICAL HISTORY:  Past Medical History:  Diagnosis Date   Allergic rhinitis, cause unspecified    Anemia, unspecified    Carpal tunnel syndrome    Cystitis, unspecified    Family history of osteoporosis    PONV (postoperative nausea and vomiting)    Postnasal drip    Syncope and collapse     SURGICAL HISTORY: Past Surgical History:  Procedure Laterality Date   BONE MARROW BIOPSY     CARPAL TUNNEL RELEASE     COLONOSCOPY  09/28/2003   COLONOSCOPY WITH PROPOFOL N/A 07/31/2015   Procedure: COLONOSCOPY WITH PROPOFOL;  Surgeon: Bernette Redbird, MD;   Location: [facility];  Service: Endoscopy;  Laterality: N/A;   DIAGNOSTIC LAPAROSCOPY     dx lack of pregnancy    EYE SURGERY     lasik, cataract, prk,yag procedure    SOCIAL HISTORY: Social History   Socioeconomic History   Marital status: Married    Spouse name: Not on file   Number of children: Not on file   Years of education: Not on file   Highest education level: Not on file  Occupational History   Not on file  Tobacco Use   Smoking status: Never   Smokeless tobacco: Never  Vaping Use   Vaping status: Never Used  Substance and Sexual Activity   Alcohol use: Yes    Alcohol/week: 0.0 standard drinks of alcohol    Comment: Rare   Drug use: No   Sexual activity: Not Currently  Other Topics Concern   Not on file  Social History Narrative   No regular exercise      Drinks lots of [employer]         Social Drivers of Health   Financial Resource Strain: Low Risk  (05/23/2023)   Overall Financial Resource Strain (CARDIA)    Difficulty of Paying Living Expenses: Not hard at all  Food Insecurity: No Food Insecurity (05/23/2023)   Hunger Vital Sign    Worried About Running Out of Food in the Last Year: Never true    Ran Out of Food in the Last Year: Never true  Transportation Needs: No Transportation Needs (05/23/2023)   PRAPARE - Administrator, Civil Service (Medical): No    Lack of Transportation (Non-Medical): No  Physical Activity: Inactive (05/23/2023)   Exercise Vital Sign    Days of Exercise per Week: 0 days    Minutes of Exercise per Session: 0 min  Stress: No Stress Concern Present (05/23/2023)   [employer] of Occupational Health - Occupational Stress Questionnaire    Feeling of Stress : Not at all  Social Connections: Moderately Integrated (05/23/2023)   Social Connection and Isolation Panel [NHANES]    Frequency of Communication with Friends and Family: More than three times a week    Frequency of Social Gatherings with Friends and Family:  More than three times a week    Attends Religious Services: More than 4 times per year    Active Member of [employer] or Organizations: No    Attends Banker Meetings: Never    Marital Status: Married  Catering manager Violence: Not At Risk (05/23/2023)   Humiliation, Afraid, Rape, and Kick questionnaire    Fear of Current or Ex-Partner: No    Emotionally Abused: No    Physically Abused: No    Sexually Abused: No    FAMILY HISTORY: Family History  Problem Relation Age of Onset   Osteoporosis Mother    Stroke Mother    Coronary artery disease Father    Stroke Father 58   Diabetes Other        Aunts and uncles   Breast cancer Paternal Aunt    Breast cancer Maternal Aunt    Arthritis/Rheumatoid Child    Breast cancer Cousin     ALLERGIES:  is allergic to codeine.  MEDICATIONS:  Current Outpatient Medications  Medication Sig Dispense Refill   acetaminophen (TYLENOL) 500 MG tablet Take 500-1,000 mg by mouth every 6 (six) hours as  needed for mild pain or headache.     aspirin EC 81 MG tablet Take 81 mg by mouth at bedtime. Swallow whole.      Biotin 10 MG CAPS Take by mouth.     calcium-vitamin D (OSCAL WITH D) 500-5 MG-MCG tablet Take 1 tablet by mouth daily with breakfast.     Darbepoetin Alfa (ARANESP) 500 MCG/ML SOSY injection Inject 500 mcg into the skin See admin instructions. Inject 500 mcg into the skin every week     desonide (DESOWEN) 0.05 % cream Apply topically.     levothyroxine (SYNTHROID) 25 MCG tablet TAKE ONE TAB BY MOUTH ONCE DAILY. TAKE ON AN EMPTY STOMACH WITH A GLASS OF WATER ATLEAST 30-60 MINUTES BEFORE BREAKFAST 90 tablet 3   loratadine (CLARITIN) 10 MG tablet Take 10 mg by mouth daily.     luspatercept-aamt (REBLOZYL) 75 MG subcutaneous injection Inject into the skin.     ondansetron (ZOFRAN ODT) 8 MG disintegrating tablet Take 1 tablet (8 mg total) by mouth every 8 (eight) hours as needed for nausea or vomiting. 30 tablet 0   Vitamin D,  Ergocalciferol, (DRISDOL) 1.25 MG (50000 UNIT) CAPS capsule Take 1 capsule (50,000 Units total) by mouth once a week. 12 capsule 1   gabapentin (NEURONTIN) 100 MG capsule Take 1 capsule by mouth at bedtime. (Patient not taking: Reported on 11/29/2023)     No current facility-administered medications for this visit.      PHYSICAL EXAMINATION:   Vitals:   11/29/23 0952  BP: (!) 102/58  Pulse: 73  Resp: 20  Temp: (!) 96.8 F (36 C)  SpO2: 97%      Filed Weights   11/29/23 0952  Weight: 153 lb (69.4 kg)      Physical Exam HENT:     Head: Normocephalic and atraumatic.     Mouth/Throat:     Pharynx: No oropharyngeal exudate.  Eyes:     Pupils: Pupils are equal, round, and reactive to light.  Cardiovascular:     Rate and Rhythm: Normal rate and regular rhythm.     Pulses: Normal pulses.     Heart sounds: Normal heart sounds.  Pulmonary:     Effort: Pulmonary effort is normal. No respiratory distress.     Breath sounds: Normal breath sounds. No wheezing.  Abdominal:     General: Bowel sounds are normal. There is no distension.     Palpations: Abdomen is soft. There is no mass.     Tenderness: There is no abdominal tenderness. There is no guarding or rebound.  Musculoskeletal:        General: No tenderness. Normal range of motion.     Cervical back: Normal range of motion and neck supple.  Skin:    General: Skin is warm.  Neurological:     Mental Status: She is alert and oriented to person, place, and time.  Psychiatric:        Mood and Affect: Affect normal.     LABORATORY DATA:  I have reviewed the data as listed Lab Results  Component Value Date   WBC 15.5 (H) 11/29/2023   HGB 11.0 (L) 11/29/2023   HCT 34.7 (L) 11/29/2023   MCV 95.3 11/29/2023   PLT 305 11/29/2023   Recent Labs    10/05/23 0840 11/02/23 0944 11/29/23 0934  NA 136 133* 137  K 4.0 4.1 4.3  CL 103 101 103  CO2 23 22 23   GLUCOSE 158* 150* 158*  BUN 23 30* 21  CREATININE 0.79 0.96  1.16*  CALCIUM 9.4 9.1 9.2  GFRNONAA >60 >60 50*  PROT 8.1 7.9 7.9  ALBUMIN 4.5 4.3 4.4  AST 22 22 22   ALT 18 17 16   ALKPHOS 42 43 45  BILITOT 0.7 0.9 1.1     No results found.   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 POSITIVE  For SF3B1; R-IPSS-low risk. MARCH 2023-bone marrow biopsy no evidence of obvious progression of MDS or any acute process.  Currently on Aranesp 100 mcg every 2-3 week plus Luspatercept.   # proceed with Luspatercept # 26 today  Continue with Aranesp prn for Hb < 10- stable.[Staring JAN 2025] Will stretch q 4 weeks given the stability/Hemoglobin- is reasonable   # Palpitations: [Dr.Gollan /sp work up 2023- Neg]-intermittent- consider OTC devices for monitoring- currently NSR.   # Leukocytosis-predominant neutrophilia immature cells noted-question related to recent steroid injection versus others. Awaiting peripheral blood flow cytometry  today.   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 S/p  appt with [facility] rheumatology -status post hip injections pain improved-  stable  # vit D def- FEB 2024-Vitamin D- 25/low-  on Vit D 50-continue vitamin D weekly. OCT 2024-  Vit D-34-recommend every other week. stable       # Peripheral neuropathy-M-proetin- 2020- NEG.  ? Etiology- poor tolerance to cymablta. S/p evaluation with Dr.Vaslow.  currently waiting on gabapentin [Dr.Patel]- Monitor for now-  stable  # CKD- stage III- 50s- monitor for now- stable  # Vaccination: s/p flu shot. Consider/recommend COVID/ RSV- stable.  # handicapped parking.   # DISPOSITION:  # HOLD Aranesp today # today Luspatecept  # follow up in 4 weeks--MD-  labs CBC/cmp;  LDH;  possible aranesp; Luspatercept SQ-  Dr.B   All questions were answered. The patient knows to call the clinic with any problems, questions or  concerns.   Earna Coder, MD 11/29/2023 12:54 PM

## 2023-12-02 LAB — COMP PANEL: LEUKEMIA/LYMPHOMA: Immunophenotypic Profile: 0

## 2023-12-15 DIAGNOSIS — D2261 Melanocytic nevi of right upper limb, including shoulder: Secondary | ICD-10-CM | POA: Diagnosis not present

## 2023-12-15 DIAGNOSIS — D2271 Melanocytic nevi of right lower limb, including hip: Secondary | ICD-10-CM | POA: Diagnosis not present

## 2023-12-15 DIAGNOSIS — L821 Other seborrheic keratosis: Secondary | ICD-10-CM | POA: Diagnosis not present

## 2023-12-15 DIAGNOSIS — D2262 Melanocytic nevi of left upper limb, including shoulder: Secondary | ICD-10-CM | POA: Diagnosis not present

## 2023-12-15 DIAGNOSIS — D2272 Melanocytic nevi of left lower limb, including hip: Secondary | ICD-10-CM | POA: Diagnosis not present

## 2023-12-15 DIAGNOSIS — D225 Melanocytic nevi of trunk: Secondary | ICD-10-CM | POA: Diagnosis not present

## 2023-12-15 DIAGNOSIS — L930 Discoid lupus erythematosus: Secondary | ICD-10-CM | POA: Diagnosis not present

## 2023-12-28 ENCOUNTER — Encounter: Payer: Self-pay | Admitting: Internal Medicine

## 2023-12-28 ENCOUNTER — Inpatient Hospital Stay: Attending: Internal Medicine

## 2023-12-28 ENCOUNTER — Inpatient Hospital Stay: Admitting: Internal Medicine

## 2023-12-28 ENCOUNTER — Inpatient Hospital Stay

## 2023-12-28 VITALS — BP 107/56 | HR 71 | Temp 96.7°F | Resp 20 | Ht 63.5 in | Wt 154.2 lb

## 2023-12-28 DIAGNOSIS — E559 Vitamin D deficiency, unspecified: Secondary | ICD-10-CM | POA: Diagnosis not present

## 2023-12-28 DIAGNOSIS — D46Z Other myelodysplastic syndromes: Secondary | ICD-10-CM

## 2023-12-28 DIAGNOSIS — D461 Refractory anemia with ring sideroblasts: Secondary | ICD-10-CM | POA: Insufficient documentation

## 2023-12-28 DIAGNOSIS — N183 Chronic kidney disease, stage 3 unspecified: Secondary | ICD-10-CM | POA: Insufficient documentation

## 2023-12-28 LAB — CBC WITH DIFFERENTIAL (CANCER CENTER ONLY)
Abs Immature Granulocytes: 1.05 10*3/uL — ABNORMAL HIGH (ref 0.00–0.07)
Basophils Absolute: 0.2 10*3/uL — ABNORMAL HIGH (ref 0.0–0.1)
Basophils Relative: 1 %
Eosinophils Absolute: 0.1 10*3/uL (ref 0.0–0.5)
Eosinophils Relative: 1 %
HCT: 35.7 % — ABNORMAL LOW (ref 36.0–46.0)
Hemoglobin: 11.3 g/dL — ABNORMAL LOW (ref 12.0–15.0)
Immature Granulocytes: 7 %
Lymphocytes Relative: 14 %
Lymphs Abs: 2.2 10*3/uL (ref 0.7–4.0)
MCH: 30.1 pg (ref 26.0–34.0)
MCHC: 31.7 g/dL (ref 30.0–36.0)
MCV: 94.9 fL (ref 80.0–100.0)
Monocytes Absolute: 1.6 10*3/uL — ABNORMAL HIGH (ref 0.1–1.0)
Monocytes Relative: 10 %
Neutro Abs: 10.3 10*3/uL — ABNORMAL HIGH (ref 1.7–7.7)
Neutrophils Relative %: 67 %
Platelet Count: 296 10*3/uL (ref 150–400)
RBC: 3.76 MIL/uL — ABNORMAL LOW (ref 3.87–5.11)
RDW: 29.2 % — ABNORMAL HIGH (ref 11.5–15.5)
Smear Review: NORMAL
WBC Count: 15.4 10*3/uL — ABNORMAL HIGH (ref 4.0–10.5)
nRBC: 1.2 % — ABNORMAL HIGH (ref 0.0–0.2)

## 2023-12-28 LAB — CMP (CANCER CENTER ONLY)
ALT: 16 U/L (ref 0–44)
AST: 22 U/L (ref 15–41)
Albumin: 4.2 g/dL (ref 3.5–5.0)
Alkaline Phosphatase: 46 U/L (ref 38–126)
Anion gap: 9 (ref 5–15)
BUN: 23 mg/dL (ref 8–23)
CO2: 25 mmol/L (ref 22–32)
Calcium: 9.2 mg/dL (ref 8.9–10.3)
Chloride: 102 mmol/L (ref 98–111)
Creatinine: 1.13 mg/dL — ABNORMAL HIGH (ref 0.44–1.00)
GFR, Estimated: 51 mL/min — ABNORMAL LOW (ref >60)
Glucose, Bld: 156 mg/dL — ABNORMAL HIGH (ref 70–99)
Potassium: 4 mmol/L (ref 3.5–5.1)
Sodium: 136 mmol/L (ref 135–145)
Total Bilirubin: 0.9 mg/dL (ref 0.0–1.2)
Total Protein: 7.8 g/dL (ref 6.5–8.1)

## 2023-12-28 LAB — LACTATE DEHYDROGENASE: LDH: 146 U/L (ref 98–192)

## 2023-12-28 MED ORDER — LUSPATERCEPT-AAMT 75 MG ~~LOC~~ SOLR
1.1000 mg/kg | Freq: Once | SUBCUTANEOUS | Status: AC
  Administered 2023-12-28: 75 mg via SUBCUTANEOUS
  Filled 2023-12-28: qty 1.5

## 2023-12-28 NOTE — Assessment & Plan Note (Addendum)
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 MARCH 2023-bone marrow biopsy no evidence of obvious progression of MDS or any acute process.  Currently on Aranesp 100 mcg every 2-3 week plus Luspatercept.   # proceed with Luspatercept # 27 today  Continue with Aranesp prn for Hb < 10- stable.[Staring JAN 2025].  Will stretch q 4 weeks given the stability/Hemoglobin- is reasonable   # Palpitations: [Dr.Gollan /sp work up 2023- Neg]-intermittent- consider OTC devices for monitoring- currently NSR.   # Leukocytosis-predominant neutrophilia immature cells noted-question related to recent steroid injection versus others. Awaiting peripheral blood flow cytometry today- stable.    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 S/p  appt with [facility] rheumatology -status post hip injections pain improved-  stable  # vit D def- FEB 2024-Vitamin D- 25/low-  on Vit D 50-continue vitamin D weekly. OCT 2024-  Vit D-34-recommend every other week. stable       # Peripheral neuropathy-M-proetin- 2020- NEG.  ? Etiology- poor tolerance to cymablta. S/p evaluation with Dr.Vaslow.  currently waiting on gabapentin [Dr.Patel]- Monitor for now- stable   # CKD- stage III- 50s- monitor for now- stable  # Vaccination: s/p flu shot. Consider/recommend COVID/ RSV- stable.  # handicapped parking.   # DISPOSITION:  # HOLD Aranesp today # today Luspatecept  # follow up in 4 weeks--MD-  labs CBC/cmp;  LDH;  possible aranesp; Luspatercept SQ-  Dr.B

## 2023-12-28 NOTE — Progress Notes (Signed)
 [facility] CONSULT NOTE  Patient Care Team: [name], Audrie Gallus, MD as PCP - General Donneta Romberg, Worthy Flank, MD as Consulting Physician (Hematology and Oncology)  CHIEF COMPLAINTS/PURPOSE OF CONSULTATION: MDS   Oncology History Overview Note   # July 2020-myelodysplastic syndrome with ringed sideroblasts-Normal karyotype; no blasts [IPSS R-Very low risk ~median survival 8.3 years]; iron studies B12 folic acid myeloma panel normal; pyridoxine levels/copper/zinc-WNL. Erythropoietin levels-60. II OPINION at [facility] [Dr.DeCastro]  # [facility]/ NGS: TET2([id number])c.2524delT(p.Ser842GlnfsTer31) Exon 3 frame-shift SF3B1([id number])c.2098A>G(p.Lys700Glu) Exon 15 missense  DNMT3A([id number])c.2204A>G(p.Tyr735Cys) Exon 19 missense   # JAN 11th 2020- Aranesp/retacrit;  # July 30th, 2021- START REVLIMID 10 mg/day  [SF3B88mutation]  MARCH 2023- BONE MARROW, ASPIRATE, CLOT, CORE:  -  Hypercellular marrow involved by myelodysplastic syndrome with ring  sideroblasts (MDS-RS)  -  Polytypic plasmacytosis   # MARCh 2023-increase the frequency of Aranesp 400 mcg every 2 weeks; continue Revlimid.   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; September 2021 ejection fraction 60 to 65%;   # colonoscopy- 2016 [Dr.Bucinni];  DIAGNOSIS: MDS/low-grade with ringed sideroblasts  STAGE: Low       ;  GOALS: Control  CURRENT/MOST RECENT THERAPY : EPO agent+ REV    MDS (myelodysplastic syndrome), low grade (HCC)  04/23/2019 Initial Diagnosis   MDS (myelodysplastic syndrome), low grade (HCC)   05/11/2022 - 05/11/2022 Chemotherapy   Patient is on Treatment Plan : MYELODYSPLASIA Luspatercept q21d     05/11/2022 -  Chemotherapy   Patient is on Treatment Plan : MYELODYSPLASIA Luspatercept q21d      HISTORY OF PRESENTING ILLNESS: Accompanied by husband.  Ambulating independently.  Carla [name] 73 y.o.  female history of low-grade MDS  anemia with ring sideroblasts currently on Aranesp/Luspatercept is here for follow-up.    Patient continues to have intermittent heart flutter/palpations last week or two.  Currently resolved.  No chest pains.    Patient continues to have occasional SOB; otherwise no worsening cough or leg swelling.  Appetite is fairly good. Still taking Calcium+D with the prescription Vitamin D.   Review of Systems  Constitutional:  Positive for malaise/fatigue. Negative for chills, diaphoresis and fever.  HENT:  Negative for nosebleeds and sore throat.   Eyes:  Negative for double vision.  Respiratory:  Negative for cough, hemoptysis, sputum production, shortness of breath and wheezing.   Cardiovascular:  Negative for chest pain and orthopnea.  Gastrointestinal:  Negative for abdominal pain, blood in stool, constipation, diarrhea, heartburn, melena and vomiting.  Genitourinary:  Negative for dysuria, frequency and urgency.  Musculoskeletal:  Positive for back pain and joint pain.  Skin: Negative.  Negative for itching and rash.  Neurological:  Negative for dizziness, tingling, focal weakness, weakness and headaches.  Endo/Heme/Allergies:  Does not bruise/bleed easily.  Psychiatric/Behavioral:  Negative for depression. The patient is not nervous/anxious and does not have insomnia.     MEDICAL HISTORY:  Past Medical History:  Diagnosis Date   Allergic rhinitis, cause unspecified    Anemia, unspecified    Carpal tunnel syndrome    Cystitis, unspecified    Family history of osteoporosis    PONV (postoperative nausea and vomiting)    Postnasal drip    Syncope and collapse     SURGICAL HISTORY: Past Surgical History:  Procedure Laterality Date   BONE MARROW BIOPSY     CARPAL TUNNEL RELEASE     COLONOSCOPY  09/28/2003   COLONOSCOPY WITH PROPOFOL N/A 07/31/2015   Procedure: COLONOSCOPY  WITH PROPOFOL;  Surgeon: Bernette Redbird, MD;  Location: [facility];  Service: Endoscopy;  Laterality: N/A;    DIAGNOSTIC LAPAROSCOPY     dx lack of pregnancy    EYE SURGERY     lasik, cataract, prk,yag procedure    SOCIAL HISTORY: Social History   Socioeconomic History   Marital status: Married    Spouse name: Not on file   Number of children: Not on file   Years of education: Not on file   Highest education level: Not on file  Occupational History   Not on file  Tobacco Use   Smoking status: Never   Smokeless tobacco: Never  Vaping Use   Vaping status: Never Used  Substance and Sexual Activity   Alcohol use: Yes    Alcohol/week: 0.0 standard drinks of alcohol    Comment: Rare   Drug use: No   Sexual activity: Not Currently  Other Topics Concern   Not on file  Social History Narrative   No regular exercise      Drinks lots of [employer]         Social Drivers of Health   Financial Resource Strain: Low Risk  (05/23/2023)   Overall Financial Resource Strain (CARDIA)    Difficulty of Paying Living Expenses: Not hard at all  Food Insecurity: No Food Insecurity (05/23/2023)   Hunger Vital Sign    Worried About Running Out of Food in the Last Year: Never true    Ran Out of Food in the Last Year: Never true  Transportation Needs: No Transportation Needs (05/23/2023)   PRAPARE - Administrator, Civil Service (Medical): No    Lack of Transportation (Non-Medical): No  Physical Activity: Inactive (05/23/2023)   Exercise Vital Sign    Days of Exercise per Week: 0 days    Minutes of Exercise per Session: 0 min  Stress: No Stress Concern Present (05/23/2023)   [employer] of Occupational Health - Occupational Stress Questionnaire    Feeling of Stress : Not at all  Social Connections: Moderately Integrated (05/23/2023)   Social Connection and Isolation Panel [NHANES]    Frequency of Communication with Friends and Family: More than three times a week    Frequency of Social Gatherings with Friends and Family: More than three times a week    Attends Religious Services: More  than 4 times per year    Active Member of [employer] or Organizations: No    Attends Banker Meetings: Never    Marital Status: Married  Catering manager Violence: Not At Risk (05/23/2023)   Humiliation, Afraid, Rape, and Kick questionnaire    Fear of Current or Ex-Partner: No    Emotionally Abused: No    Physically Abused: No    Sexually Abused: No    FAMILY HISTORY: Family History  Problem Relation Age of Onset   Osteoporosis Mother    Stroke Mother    Coronary artery disease Father    Stroke Father 97   Diabetes Other        Aunts and uncles   Breast cancer Paternal Aunt    Breast cancer Maternal Aunt    Arthritis/Rheumatoid Child    Breast cancer Cousin     ALLERGIES:  is allergic to codeine.  MEDICATIONS:  Current Outpatient Medications  Medication Sig Dispense Refill   acetaminophen (TYLENOL) 500 MG tablet Take 500-1,000 mg by mouth every 6 (six) hours as needed for mild pain or headache.     aspirin EC  81 MG tablet Take 81 mg by mouth at bedtime. Swallow whole.      Biotin 10 MG CAPS Take by mouth.     calcium-vitamin D (OSCAL WITH D) 500-5 MG-MCG tablet Take 1 tablet by mouth daily with breakfast.     Darbepoetin Alfa (ARANESP) 500 MCG/ML SOSY injection Inject 500 mcg into the skin See admin instructions. Inject 500 mcg into the skin every week     desonide (DESOWEN) 0.05 % cream Apply topically.     levothyroxine (SYNTHROID) 25 MCG tablet TAKE ONE TAB BY MOUTH ONCE DAILY. TAKE ON AN EMPTY STOMACH WITH A GLASS OF WATER ATLEAST 30-60 MINUTES BEFORE BREAKFAST 90 tablet 3   loratadine (CLARITIN) 10 MG tablet Take 10 mg by mouth daily.     luspatercept-aamt (REBLOZYL) 75 MG subcutaneous injection Inject into the skin.     ondansetron (ZOFRAN ODT) 8 MG disintegrating tablet Take 1 tablet (8 mg total) by mouth every 8 (eight) hours as needed for nausea or vomiting. 30 tablet 0   Vitamin D, Ergocalciferol, (DRISDOL) 1.25 MG (50000 UNIT) CAPS capsule Take 1 capsule  (50,000 Units total) by mouth once a week. 12 capsule 1   gabapentin (NEURONTIN) 100 MG capsule Take 1 capsule by mouth at bedtime. (Patient not taking: Reported on 12/28/2023)     No current facility-administered medications for this visit.      PHYSICAL EXAMINATION:   Vitals:   12/28/23 0906  BP: (!) 107/56  Pulse: 71  Resp: 20  Temp: (!) 96.7 F (35.9 C)  SpO2: 95%       Filed Weights   12/28/23 0906  Weight: 154 lb 3.2 oz (69.9 kg)       Physical Exam HENT:     Head: Normocephalic and atraumatic.     Mouth/Throat:     Pharynx: No oropharyngeal exudate.  Eyes:     Pupils: Pupils are equal, round, and reactive to light.  Cardiovascular:     Rate and Rhythm: Normal rate and regular rhythm.     Pulses: Normal pulses.     Heart sounds: Normal heart sounds.  Pulmonary:     Effort: Pulmonary effort is normal. No respiratory distress.     Breath sounds: Normal breath sounds. No wheezing.  Abdominal:     General: Bowel sounds are normal. There is no distension.     Palpations: Abdomen is soft. There is no mass.     Tenderness: There is no abdominal tenderness. There is no guarding or rebound.  Musculoskeletal:        General: No tenderness. Normal range of motion.     Cervical back: Normal range of motion and neck supple.  Skin:    General: Skin is warm.  Neurological:     Mental Status: She is alert and oriented to person, place, and time.  Psychiatric:        Mood and Affect: Affect normal.     LABORATORY DATA:  I have reviewed the data as listed Lab Results  Component Value Date   WBC 15.4 (H) 12/28/2023   HGB 11.3 (L) 12/28/2023   HCT 35.7 (L) 12/28/2023   MCV 94.9 12/28/2023   PLT 296 12/28/2023   Recent Labs    11/02/23 0944 11/29/23 0934 12/28/23 0856  NA 133* 137 136  K 4.1 4.3 4.0  CL 101 103 102  CO2 22 23 25   GLUCOSE 150* 158* 156*  BUN 30* 21 23  CREATININE 0.96 1.16* 1.13*  CALCIUM 9.1  9.2 9.2  GFRNONAA >60 50* 51*  PROT 7.9  7.9 7.8  ALBUMIN 4.3 4.4 4.2  AST 22 22 22   ALT 17 16 16   ALKPHOS 43 45 46  BILITOT 0.9 1.1 0.9     No results found.   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 POSITIVE  For SF3B1; R-IPSS-low risk. MARCH 2023-bone marrow biopsy no evidence of obvious progression of MDS or any acute process.  Currently on Aranesp 100 mcg every 2-3 week plus Luspatercept.   # proceed with Luspatercept # 27 today  Continue with Aranesp prn for Hb < 10- stable.[Staring JAN 2025].  Will stretch q 4 weeks given the stability/Hemoglobin- is reasonable   # Palpitations: [Dr.Gollan /sp work up 2023- Neg]-intermittent- consider OTC devices for monitoring- currently NSR.   # Leukocytosis-predominant neutrophilia immature cells noted-question related to recent steroid injection versus others. Awaiting peripheral blood flow cytometry today- stable.    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 S/p  appt with [facility] rheumatology -status post hip injections pain improved-  stable  # vit D def- FEB 2024-Vitamin D- 25/low-  on Vit D 50-continue vitamin D weekly. OCT 2024-  Vit D-34-recommend every other week. stable       # Peripheral neuropathy-M-proetin- 2020- NEG.  ? Etiology- poor tolerance to cymablta. S/p evaluation with Dr.Vaslow.  currently waiting on gabapentin [Dr.Patel]- Monitor for now- stable   # CKD- stage III- 50s- monitor for now- stable  # Vaccination: s/p flu shot. Consider/recommend COVID/ RSV- stable.  # handicapped parking.   # DISPOSITION:  # HOLD Aranesp today # today Luspatecept  # follow up in 4 weeks--MD-  labs CBC/cmp;  LDH;  possible aranesp; Luspatercept SQ-  Dr.B    All questions were answered. The patient knows to call the clinic with any problems, questions or concerns.   Earna Coder, MD 12/28/2023 10:29  AM

## 2023-12-28 NOTE — Progress Notes (Signed)
 No concerns today

## 2024-01-02 LAB — COMP PANEL: LEUKEMIA/LYMPHOMA

## 2024-01-17 DIAGNOSIS — M542 Cervicalgia: Secondary | ICD-10-CM | POA: Diagnosis not present

## 2024-01-17 DIAGNOSIS — M545 Low back pain, unspecified: Secondary | ICD-10-CM | POA: Diagnosis not present

## 2024-01-17 DIAGNOSIS — S300XXA Contusion of lower back and pelvis, initial encounter: Secondary | ICD-10-CM | POA: Diagnosis not present

## 2024-01-25 ENCOUNTER — Inpatient Hospital Stay

## 2024-01-25 ENCOUNTER — Encounter: Payer: Self-pay | Admitting: Internal Medicine

## 2024-01-25 ENCOUNTER — Inpatient Hospital Stay: Admitting: Internal Medicine

## 2024-01-25 VITALS — BP 121/72 | HR 69 | Temp 96.9°F | Resp 16 | Ht 63.5 in | Wt 156.5 lb

## 2024-01-25 DIAGNOSIS — D46Z Other myelodysplastic syndromes: Secondary | ICD-10-CM

## 2024-01-25 DIAGNOSIS — D461 Refractory anemia with ring sideroblasts: Secondary | ICD-10-CM | POA: Diagnosis not present

## 2024-01-25 LAB — CBC WITH DIFFERENTIAL (CANCER CENTER ONLY)
Abs Immature Granulocytes: 0.68 10*3/uL — ABNORMAL HIGH (ref 0.00–0.07)
Basophils Absolute: 0.2 10*3/uL — ABNORMAL HIGH (ref 0.0–0.1)
Basophils Relative: 1 %
Eosinophils Absolute: 0.1 10*3/uL (ref 0.0–0.5)
Eosinophils Relative: 1 %
HCT: 33.5 % — ABNORMAL LOW (ref 36.0–46.0)
Hemoglobin: 10.5 g/dL — ABNORMAL LOW (ref 12.0–15.0)
Immature Granulocytes: 5 %
Lymphocytes Relative: 19 %
Lymphs Abs: 2.3 10*3/uL (ref 0.7–4.0)
MCH: 30 pg (ref 26.0–34.0)
MCHC: 31.3 g/dL (ref 30.0–36.0)
MCV: 95.7 fL (ref 80.0–100.0)
Monocytes Absolute: 1.7 10*3/uL — ABNORMAL HIGH (ref 0.1–1.0)
Monocytes Relative: 14 %
Neutro Abs: 7.6 10*3/uL (ref 1.7–7.7)
Neutrophils Relative %: 60 %
Platelet Count: 301 10*3/uL (ref 150–400)
RBC: 3.5 MIL/uL — ABNORMAL LOW (ref 3.87–5.11)
RDW: 29.6 % — ABNORMAL HIGH (ref 11.5–15.5)
WBC Count: 12.5 10*3/uL — ABNORMAL HIGH (ref 4.0–10.5)
nRBC: 0.8 % — ABNORMAL HIGH (ref 0.0–0.2)

## 2024-01-25 LAB — CMP (CANCER CENTER ONLY)
ALT: 16 U/L (ref 0–44)
AST: 20 U/L (ref 15–41)
Albumin: 4.2 g/dL (ref 3.5–5.0)
Alkaline Phosphatase: 62 U/L (ref 38–126)
Anion gap: 10 (ref 5–15)
BUN: 21 mg/dL (ref 8–23)
CO2: 24 mmol/L (ref 22–32)
Calcium: 9.4 mg/dL (ref 8.9–10.3)
Chloride: 104 mmol/L (ref 98–111)
Creatinine: 1.01 mg/dL — ABNORMAL HIGH (ref 0.44–1.00)
GFR, Estimated: 59 mL/min — ABNORMAL LOW (ref >60)
Glucose, Bld: 135 mg/dL — ABNORMAL HIGH (ref 70–99)
Potassium: 4.1 mmol/L (ref 3.5–5.1)
Sodium: 138 mmol/L (ref 135–145)
Total Bilirubin: 0.6 mg/dL (ref 0.0–1.2)
Total Protein: 8 g/dL (ref 6.5–8.1)

## 2024-01-25 LAB — LACTATE DEHYDROGENASE: LDH: 139 U/L (ref 98–192)

## 2024-01-25 MED ORDER — LUSPATERCEPT-AAMT 75 MG ~~LOC~~ SOLR
1.0000 mg/kg | Freq: Once | SUBCUTANEOUS | Status: AC
  Administered 2024-01-25: 70 mg via SUBCUTANEOUS
  Filled 2024-01-25: qty 1.4

## 2024-01-25 NOTE — Progress Notes (Signed)
 Pt fell 01/05/24, having back pain, soreness, hit her head. She fell backwards on her hardwood floor, she was putting on her bedroom shoes and lost balance. Pain 2/10.  Xrays of her back with ortho, [facility] Thomos Flies. Was given hydrocodone, she stopped due to nausea.

## 2024-01-25 NOTE — Progress Notes (Signed)
 [facility] CONSULT NOTE  Patient Care Team: [name], Manley Seeds, MD as PCP - General Valentine Gasmen, Donnald Fuss, MD as Consulting Physician (Hematology and Oncology)  CHIEF COMPLAINTS/PURPOSE OF CONSULTATION: MDS   Oncology History Overview Note   # July 2020-myelodysplastic syndrome with ringed sideroblasts-Normal karyotype; no blasts [IPSS R-Very low risk ~median survival 8.3 years]; iron studies B12 folic acid  myeloma panel normal; pyridoxine levels/copper /zinc -WNL. Erythropoietin  levels-60. II OPINION at [facility] [Dr.DeCastro]  # [facility]/ NGS: TET2([id number])c.2524delT(p.Ser842GlnfsTer31) Exon 3 frame-shift SF3B1([id number])c.2098A>G(p.Lys700Glu) Exon 15 missense  DNMT3A([id number])c.2204A>G(p.Tyr735Cys) Exon 19 missense   # JAN 11th 2020- Aranesp /retacrit;  # July 30th, 2021- START REVLIMID  10 mg/day  [SF3B70mutation]  MARCH 2023- BONE MARROW, ASPIRATE, CLOT, CORE:  -  Hypercellular marrow involved by myelodysplastic syndrome with ring  sideroblasts (MDS-RS)  -  Polytypic plasmacytosis   # [facility] 2023-increase the frequency of Aranesp  400 mcg every 2 weeks; continue Revlimid .   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  Ambulating independently.  Carla [name] 73 y.o.  female history of low-grade MDS anemia with ring sideroblasts currently on  Luspatercept  is here for follow-up.    Pt fell 01/05/24, having back pain, soreness, hit her head. She fell backwards on her hardwood floor, she was putting on her bedroom shoes and lost balance. Pain 2/10.   Xrays of her back with ortho, [facility] Thomos Flies. Was given hydrocodone, she stopped due to nausea.   Patient continues to have occasional SOB; otherwise no worsening cough or leg swelling. Appetite is fairly good. Still taking Calcium+D with the prescription Vitamin D .   Review of Systems  Constitutional:  Positive for malaise/fatigue. Negative for chills, diaphoresis and fever.  HENT:  Negative for nosebleeds and sore throat.   Eyes:  Negative for double vision.  Respiratory:  Negative for cough, hemoptysis, sputum production, shortness of breath and wheezing.   Cardiovascular:  Negative for chest pain and orthopnea.  Gastrointestinal:  Negative for abdominal pain, blood in stool, constipation, diarrhea, heartburn, melena and vomiting.  Genitourinary:  Negative for dysuria, frequency and urgency.  Musculoskeletal:  Positive for back pain and joint pain.  Skin: Negative.  Negative for itching and rash.  Neurological:  Negative for dizziness, tingling, focal weakness, weakness and headaches.  Endo/Heme/Allergies:  Does not bruise/bleed easily.  Psychiatric/Behavioral:  Negative for depression. The patient is not nervous/anxious and does not have insomnia.     MEDICAL HISTORY:  Past Medical History:  Diagnosis Date   Allergic rhinitis, cause unspecified    Anemia, unspecified    Carpal tunnel syndrome    Cystitis, unspecified    Family history of osteoporosis    PONV (postoperative nausea and vomiting)    Postnasal drip    Syncope and collapse     SURGICAL HISTORY: Past Surgical History:  Procedure Laterality Date   BONE MARROW BIOPSY  CARPAL TUNNEL RELEASE     COLONOSCOPY  09/28/2003   COLONOSCOPY WITH PROPOFOL  N/A 07/31/2015   Procedure: COLONOSCOPY WITH PROPOFOL ;   Surgeon: Lanita Pitman, MD;  Location: [facility];  Service: Endoscopy;  Laterality: N/A;   DIAGNOSTIC LAPAROSCOPY     dx lack of pregnancy    EYE SURGERY     lasik, cataract, prk,yag procedure    SOCIAL HISTORY: Social History   Socioeconomic History   Marital status: Married    Spouse name: Not on file   Number of children: Not on file   Years of education: Not on file   Highest education level: Not on file  Occupational History   Not on file  Tobacco Use   Smoking status: Never   Smokeless tobacco: Never  Vaping Use   Vaping status: Never Used  Substance and Sexual Activity   Alcohol use: Yes    Alcohol/week: 0.0 standard drinks of alcohol    Comment: Rare   Drug use: No   Sexual activity: Not Currently  Other Topics Concern   Not on file  Social History Narrative   No regular exercise      Drinks lots of [employer]         Social Drivers of Health   Financial Resource Strain: Low Risk  (05/23/2023)   Overall Financial Resource Strain (CARDIA)    Difficulty of Paying Living Expenses: Not hard at all  Food Insecurity: No Food Insecurity (05/23/2023)   Hunger Vital Sign    Worried About Running Out of Food in the Last Year: Never true    Ran Out of Food in the Last Year: Never true  Transportation Needs: No Transportation Needs (05/23/2023)   PRAPARE - Administrator, Civil Service (Medical): No    Lack of Transportation (Non-Medical): No  Physical Activity: Inactive (05/23/2023)   Exercise Vital Sign    Days of Exercise per Week: 0 days    Minutes of Exercise per Session: 0 min  Stress: No Stress Concern Present (05/23/2023)   [employer] of Occupational Health - Occupational Stress Questionnaire    Feeling of Stress : Not at all  Social Connections: Moderately Integrated (05/23/2023)   Social Connection and Isolation Panel [NHANES]    Frequency of Communication with Friends and Family: More than three times a week    Frequency of Social  Gatherings with Friends and Family: More than three times a week    Attends Religious Services: More than 4 times per year    Active Member of [employer] or Organizations: No    Attends Banker Meetings: Never    Marital Status: Married  Catering manager Violence: Not At Risk (05/23/2023)   Humiliation, Afraid, Rape, and Kick questionnaire    Fear of Current or Ex-Partner: No    Emotionally Abused: No    Physically Abused: No    Sexually Abused: No    FAMILY HISTORY: Family History  Problem Relation Age of Onset   Osteoporosis Mother    Stroke Mother    Coronary artery disease Father    Stroke Father 29   Diabetes Other        Aunts and uncles   Breast cancer Paternal Aunt    Breast cancer Maternal Aunt    Arthritis/Rheumatoid Child    Breast cancer Cousin     ALLERGIES:  is allergic to codeine.  MEDICATIONS:  Current Outpatient Medications  Medication Sig Dispense Refill   acetaminophen  (TYLENOL ) 500 MG tablet Take  500-1,000 mg by mouth every 6 (six) hours as needed for mild pain or headache.     aspirin EC 81 MG tablet Take 81 mg by mouth at bedtime. Swallow whole.      Biotin 10 MG CAPS Take by mouth.     calcium-vitamin D  (OSCAL WITH D) 500-5 MG-MCG tablet Take 1 tablet by mouth daily with breakfast.     Darbepoetin Alfa  (ARANESP ) 500 MCG/ML SOSY injection Inject 500 mcg into the skin See admin instructions. Inject 500 mcg into the skin every week     desonide (DESOWEN) 0.05 % cream Apply topically.     levothyroxine  (SYNTHROID ) 25 MCG tablet TAKE ONE TAB BY MOUTH ONCE DAILY. TAKE ON AN EMPTY STOMACH WITH A GLASS OF WATER ATLEAST 30-60 MINUTES BEFORE BREAKFAST 90 tablet 3   loratadine (CLARITIN) 10 MG tablet Take 10 mg by mouth daily.     luspatercept -aamt (REBLOZYL ) 75 MG subcutaneous injection Inject into the skin.     ondansetron  (ZOFRAN  ODT) 8 MG disintegrating tablet Take 1 tablet (8 mg total) by mouth every 8 (eight) hours as needed for nausea or vomiting.  30 tablet 0   Vitamin D , Ergocalciferol , (DRISDOL ) 1.25 MG (50000 UNIT) CAPS capsule Take 1 capsule (50,000 Units total) by mouth once a week. 12 capsule 1   gabapentin (NEURONTIN) 100 MG capsule Take 1 capsule by mouth at bedtime. (Patient not taking: Reported on 11/29/2023)     No current facility-administered medications for this visit.   Facility-Administered Medications Ordered in Other Visits  Medication Dose Route Frequency Provider Last Rate Last Admin   luspatercept -aamt (REBLOZYL ) subcutaneous injection 70 mg  1 mg/kg (Treatment Plan Recorded) Subcutaneous Once Daryle Boyington R, MD          PHYSICAL EXAMINATION:   Vitals:   01/25/24 0958  BP: 121/72  Pulse: 69  Resp: 16  Temp: (!) 96.9 F (36.1 C)  SpO2: 97%       Filed Weights   01/25/24 0958  Weight: 156 lb 8 oz (71 kg)       Physical Exam HENT:     Head: Normocephalic and atraumatic.     Mouth/Throat:     Pharynx: No oropharyngeal exudate.  Eyes:     Pupils: Pupils are equal, round, and reactive to light.  Cardiovascular:     Rate and Rhythm: Normal rate and regular rhythm.     Pulses: Normal pulses.     Heart sounds: Normal heart sounds.  Pulmonary:     Effort: Pulmonary effort is normal. No respiratory distress.     Breath sounds: Normal breath sounds. No wheezing.  Abdominal:     General: Bowel sounds are normal. There is no distension.     Palpations: Abdomen is soft. There is no mass.     Tenderness: There is no abdominal tenderness. There is no guarding or rebound.  Musculoskeletal:        General: No tenderness. Normal range of motion.     Cervical back: Normal range of motion and neck supple.  Skin:    General: Skin is warm.  Neurological:     Mental Status: She is alert and oriented to person, place, and time.  Psychiatric:        Mood and Affect: Affect normal.     LABORATORY DATA:  I have reviewed the data as listed Lab Results  Component Value Date   WBC 12.5 (H)  01/25/2024   HGB 10.5 (L) 01/25/2024   HCT 33.5 (  L) 01/25/2024   MCV 95.7 01/25/2024   PLT 301 01/25/2024   Recent Labs    11/29/23 0934 12/28/23 0856 01/25/24 1002  NA 137 136 138  K 4.3 4.0 4.1  CL 103 102 104  CO2 23 25 24   GLUCOSE 158* 156* 135*  BUN 21 23 21   CREATININE 1.16* 1.13* 1.01*  CALCIUM 9.2 9.2 9.4  GFRNONAA 50* 51* 59*  PROT 7.9 7.8 8.0  ALBUMIN 4.4 4.2 4.2  AST 22 22 20   ALT 16 16 16   ALKPHOS 45 46 62  BILITOT 1.1 0.9 0.6     No results found.   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 POSITIVE  For SF3B1; R-IPSS-low risk. MARCH 2023-bone marrow biopsy no evidence of obvious progression of MDS or any acute process.  Currently on Aranesp  100 mcg every 2-3 week plus Luspatercept .   # proceed with Luspatercept  # 28 today .  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 S/p  appt with [facility] rheumatology -status post hip injections pain improved-  stable  # vit D def- FEB 2024-Vitamin D - 25/low-  on Vit D 50-continue vitamin D  weekly. OCT 2024-  Vit D-34-recommend every other week. stable       # Peripheral neuropathy-M-proetin- 2020- NEG.  ? Etiology- poor tolerance to cymablta. S/p evaluation with Dr.Vaslow.  currently waiting on gabapentin [Dr.Patel]- Monitor for now- stable   # CKD- stage III- 50s- monitor for now- stable  # Vaccination: s/p flu shot. Consider/recommend COVID/ RSV-  stable  # handicapped parking.   PS # DISPOSITION:  # today Luspatecept  # follow up in 4 weeks--MD-  labs CBC/cmp;  LDH; Luspatercept  SQ-  Dr.B     All questions were answered. The patient knows to call the clinic with any problems, questions or concerns.   Gwyn Leos, MD 01/25/2024 10:48 AM

## 2024-01-25 NOTE — Addendum Note (Signed)
 Addended by: Vivi Grit on: 01/25/2024 10:51 AM   Modules accepted: Orders

## 2024-01-25 NOTE — Assessment & Plan Note (Signed)
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 MARCH 2023-bone marrow biopsy no evidence of obvious progression of MDS or any acute process.  Currently on Aranesp  100 mcg every 2-3 week plus Luspatercept .   # proceed with Luspatercept  # 28 today .  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 S/p  appt with [facility] rheumatology -status post hip injections pain improved-  stable  # vit D def- FEB 2024-Vitamin D - 25/low-  on Vit D 50-continue vitamin D  weekly. OCT 2024-  Vit D-34-recommend every other week. stable       # Peripheral neuropathy-M-proetin- 2020- NEG.  ? Etiology- poor tolerance to cymablta. S/p evaluation with Dr.Vaslow.  currently waiting on gabapentin [Dr.Patel]- Monitor for now- stable   # CKD- stage III- 50s- monitor for now- stable  # Vaccination: s/p flu shot. Consider/recommend COVID/ RSV- stable  # handicapped parking.   PS # DISPOSITION:  # today Luspatecept  # follow up in 4 weeks--MD-  labs CBC/cmp;  LDH; Luspatercept  SQ-  Dr.B

## 2024-02-23 ENCOUNTER — Encounter: Payer: Self-pay | Admitting: Nurse Practitioner

## 2024-02-23 ENCOUNTER — Inpatient Hospital Stay (HOSPITAL_BASED_OUTPATIENT_CLINIC_OR_DEPARTMENT_OTHER): Admitting: Nurse Practitioner

## 2024-02-23 ENCOUNTER — Inpatient Hospital Stay

## 2024-02-23 ENCOUNTER — Encounter: Payer: Self-pay | Admitting: Internal Medicine

## 2024-02-23 ENCOUNTER — Inpatient Hospital Stay: Attending: Internal Medicine

## 2024-02-23 VITALS — BP 134/59 | HR 64 | Temp 97.0°F | Resp 18 | Ht 63.0 in | Wt 155.0 lb

## 2024-02-23 DIAGNOSIS — D46Z Other myelodysplastic syndromes: Secondary | ICD-10-CM

## 2024-02-23 DIAGNOSIS — D461 Refractory anemia with ring sideroblasts: Secondary | ICD-10-CM | POA: Insufficient documentation

## 2024-02-23 LAB — CBC WITH DIFFERENTIAL/PLATELET
Abs Immature Granulocytes: 1.06 10*3/uL — ABNORMAL HIGH (ref 0.00–0.07)
Basophils Absolute: 0.2 10*3/uL — ABNORMAL HIGH (ref 0.0–0.1)
Basophils Relative: 1 %
Eosinophils Absolute: 0.1 10*3/uL (ref 0.0–0.5)
Eosinophils Relative: 0 %
HCT: 32.1 % — ABNORMAL LOW (ref 36.0–46.0)
Hemoglobin: 10 g/dL — ABNORMAL LOW (ref 12.0–15.0)
Immature Granulocytes: 8 %
Lymphocytes Relative: 16 %
Lymphs Abs: 2.3 10*3/uL (ref 0.7–4.0)
MCH: 29.4 pg (ref 26.0–34.0)
MCHC: 31.2 g/dL (ref 30.0–36.0)
MCV: 94.4 fL (ref 80.0–100.0)
Monocytes Absolute: 1.6 10*3/uL — ABNORMAL HIGH (ref 0.1–1.0)
Monocytes Relative: 11 %
Neutro Abs: 9 10*3/uL — ABNORMAL HIGH (ref 1.7–7.7)
Neutrophils Relative %: 64 %
Platelets: 253 10*3/uL (ref 150–400)
RBC: 3.4 MIL/uL — ABNORMAL LOW (ref 3.87–5.11)
RDW: 29.1 % — ABNORMAL HIGH (ref 11.5–15.5)
Smear Review: NORMAL
WBC: 14.1 10*3/uL — ABNORMAL HIGH (ref 4.0–10.5)
nRBC: 1.1 % — ABNORMAL HIGH (ref 0.0–0.2)

## 2024-02-23 LAB — CMP (CANCER CENTER ONLY)
ALT: 15 U/L (ref 0–44)
AST: 23 U/L (ref 15–41)
Albumin: 4.3 g/dL (ref 3.5–5.0)
Alkaline Phosphatase: 42 U/L (ref 38–126)
Anion gap: 11 (ref 5–15)
BUN: 19 mg/dL (ref 8–23)
CO2: 22 mmol/L (ref 22–32)
Calcium: 9.2 mg/dL (ref 8.9–10.3)
Chloride: 104 mmol/L (ref 98–111)
Creatinine: 1.02 mg/dL — ABNORMAL HIGH (ref 0.44–1.00)
GFR, Estimated: 58 mL/min — ABNORMAL LOW (ref >60)
Glucose, Bld: 142 mg/dL — ABNORMAL HIGH (ref 70–99)
Potassium: 4.2 mmol/L (ref 3.5–5.1)
Sodium: 137 mmol/L (ref 135–145)
Total Bilirubin: 0.8 mg/dL (ref 0.0–1.2)
Total Protein: 8.1 g/dL (ref 6.5–8.1)

## 2024-02-23 LAB — LACTATE DEHYDROGENASE: LDH: 135 U/L (ref 98–192)

## 2024-02-23 MED ORDER — LUSPATERCEPT-AAMT 75 MG ~~LOC~~ SOLR
1.0000 mg/kg | Freq: Once | SUBCUTANEOUS | Status: AC
  Administered 2024-02-23: 70 mg via SUBCUTANEOUS
  Filled 2024-02-23: qty 1.4

## 2024-02-23 NOTE — Progress Notes (Signed)
 [facility] CONSULT NOTE  Patient Care Team: [name], Manley Seeds, MD as PCP - General Valentine Gasmen, Donnald Fuss, MD as Consulting Physician (Hematology and Oncology)  CHIEF COMPLAINTS/PURPOSE OF CONSULTATION: MDS  Oncology History Overview Note   # July 2020-myelodysplastic syndrome with ringed sideroblasts-Normal karyotype; no blasts [IPSS R-Very low risk ~median survival 8.3 years]; iron studies B12 folic acid  myeloma panel normal; pyridoxine levels/copper /zinc -WNL. Erythropoietin  levels-60. II OPINION at [facility] [Dr.DeCastro]  # [facility]/ NGS: TET2([id number])c.2524delT(p.Ser842GlnfsTer31) Exon 3 frame-shift SF3B1([id number])c.2098A>G(p.Lys700Glu) Exon 15 missense  DNMT3A(NM_[id number])c.2204A>G(p.Tyr735Cys) Exon 19 missense   # JAN 11th 2020- Aranesp /retacrit;  # July 30th, 2021- START REVLIMID  10 mg/day  [SF3B62mutation]  MARCH 2023- BONE MARROW, ASPIRATE, CLOT, CORE:  -  Hypercellular marrow involved by myelodysplastic syndrome with ring  sideroblasts (MDS-RS)  -  Polytypic plasmacytosis   # MARCh 2023-increase the frequency of Aranesp  400 mcg every 2 weeks; continue Revlimid .   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  Ambulating independently.  Carla [name] 73 y.o.  female history of low-grade MDS anemia with ring sideroblasts currently on  Luspatercept  is here for follow-up. She feels well. Tired at times but not bothersome. No edema, dizziness. Denies nausea, diarrhea, or pain. Aches and pains are stable.    Review of Systems  Constitutional:  Positive for malaise/fatigue. Negative for chills, diaphoresis and fever.  HENT:  Negative for nosebleeds and sore throat.   Eyes:  Negative for double vision.  Respiratory:  Negative for cough, hemoptysis, sputum production, shortness of breath and wheezing.   Cardiovascular:  Negative for chest pain and orthopnea.  Gastrointestinal:  Negative for abdominal pain, blood in stool, constipation, diarrhea, heartburn, melena and vomiting.  Genitourinary:  Negative for dysuria, frequency and urgency.  Musculoskeletal:  Positive for back pain and joint pain.  Skin: Negative.  Negative for itching and rash.  Neurological:  Negative for dizziness, tingling, focal weakness, weakness and headaches.  Endo/Heme/Allergies:  Does not bruise/bleed easily.  Psychiatric/Behavioral:  Negative for depression. The patient is not nervous/anxious and does not have insomnia.    MEDICAL HISTORY:  Past Medical History:  Diagnosis Date   Allergic rhinitis, cause unspecified    Anemia, unspecified    Carpal tunnel syndrome    Cystitis, unspecified    Family history of osteoporosis    PONV (postoperative nausea and vomiting)    Postnasal drip    Syncope and collapse    SURGICAL HISTORY: Past Surgical History:  Procedure Laterality Date   BONE MARROW BIOPSY     CARPAL TUNNEL RELEASE     COLONOSCOPY  09/28/2003   COLONOSCOPY WITH PROPOFOL  N/A 07/31/2015   Procedure: COLONOSCOPY WITH PROPOFOL ;  Surgeon: Lanita Pitman, MD;  Location: [facility];  Service: Endoscopy;  Laterality: N/A;   DIAGNOSTIC LAPAROSCOPY     dx lack of pregnancy    EYE SURGERY  lasik, cataract, prk,yag procedure   SOCIAL HISTORY: Social History   Socioeconomic History   Marital status: Married    Spouse name: Not on  file   Number of children: Not on file   Years of education: Not on file   Highest education level: Not on file  Occupational History   Not on file  Tobacco Use   Smoking status: Never   Smokeless tobacco: Never  Vaping Use   Vaping status: Never Used  Substance and Sexual Activity   Alcohol use: Yes    Alcohol/week: 0.0 standard drinks of alcohol    Comment: Rare   Drug use: No   Sexual activity: Not Currently  Other Topics Concern   Not on file  Social History Narrative   No regular exercise      Drinks lots of [employer]         Social Drivers of Health   Financial Resource Strain: Low Risk  (05/23/2023)   Overall Financial Resource Strain (CARDIA)    Difficulty of Paying Living Expenses: Not hard at all  Food Insecurity: No Food Insecurity (05/23/2023)   Hunger Vital Sign    Worried About Running Out of Food in the Last Year: Never true    Ran Out of Food in the Last Year: Never true  Transportation Needs: No Transportation Needs (05/23/2023)   PRAPARE - Administrator, Civil Service (Medical): No    Lack of Transportation (Non-Medical): No  Physical Activity: Inactive (05/23/2023)   Exercise Vital Sign    Days of Exercise per Week: 0 days    Minutes of Exercise per Session: 0 min  Stress: No Stress Concern Present (05/23/2023)   [employer] of Occupational Health - Occupational Stress Questionnaire    Feeling of Stress : Not at all  Social Connections: Moderately Integrated (05/23/2023)   Social Connection and Isolation Panel [NHANES]    Frequency of Communication with Friends and Family: More than three times a week    Frequency of Social Gatherings with Friends and Family: More than three times a week    Attends Religious Services: More than 4 times per year    Active Member of [employer] or Organizations: No    Attends Banker Meetings: Never    Marital Status: Married  Catering manager Violence: Not At Risk (05/23/2023)   Humiliation,  Afraid, Rape, and Kick questionnaire    Fear of Current or Ex-Partner: No    Emotionally Abused: No    Physically Abused: No    Sexually Abused: No   FAMILY HISTORY: Family History  Problem Relation Age of Onset   Osteoporosis Mother    Stroke Mother    Coronary artery disease Father    Stroke Father 76   Diabetes Other        Aunts and uncles   Breast cancer Paternal Aunt    Breast cancer Maternal Aunt    Arthritis/Rheumatoid Child    Breast cancer Cousin    ALLERGIES:  is allergic to codeine.  MEDICATIONS:  Current Outpatient Medications  Medication Sig Dispense Refill   loratadine (CLARITIN) 10 MG tablet Take 10 mg by mouth daily.     luspatercept -aamt (REBLOZYL ) 75 MG subcutaneous injection Inject into the skin.     acetaminophen  (TYLENOL ) 500 MG tablet Take 500-1,000 mg by mouth every 6 (six) hours as needed for mild pain or headache.     aspirin EC 81 MG tablet Take 81 mg by mouth at bedtime. Swallow whole.  Biotin 10 MG CAPS Take by mouth.     calcium-vitamin D  (OSCAL WITH D) 500-5 MG-MCG tablet Take 1 tablet by mouth daily with breakfast.     Darbepoetin Alfa  (ARANESP ) 500 MCG/ML SOSY injection Inject 500 mcg into the skin See admin instructions. Inject 500 mcg into the skin every week     desonide (DESOWEN) 0.05 % cream Apply topically.     gabapentin (NEURONTIN) 100 MG capsule Take 1 capsule by mouth at bedtime. (Patient not taking: Reported on 02/23/2024)     levothyroxine  (SYNTHROID ) 25 MCG tablet TAKE ONE TAB BY MOUTH ONCE DAILY. TAKE ON AN EMPTY STOMACH WITH A GLASS OF WATER ATLEAST 30-60 MINUTES BEFORE BREAKFAST 90 tablet 3   ondansetron  (ZOFRAN  ODT) 8 MG disintegrating tablet Take 1 tablet (8 mg total) by mouth every 8 (eight) hours as needed for nausea or vomiting. 30 tablet 0   Vitamin D , Ergocalciferol , (DRISDOL ) 1.25 MG (50000 UNIT) CAPS capsule Take 1 capsule (50,000 Units total) by mouth once a week. 12 capsule 1   No current facility-administered  medications for this visit.   Facility-Administered Medications Ordered in Other Visits  Medication Dose Route Frequency Provider Last Rate Last Admin   luspatercept -aamt (REBLOZYL ) subcutaneous injection 70 mg  1 mg/kg (Treatment Plan Recorded) Subcutaneous Once Brahmanday, Govinda R, MD        PHYSICAL EXAMINATION: Vitals:   02/23/24 1048  BP: (!) 134/59  Pulse: 64  Resp: 18  Temp: (!) 97 F (36.1 C)  SpO2: 98%   Filed Weights   02/23/24 1048  Weight: 155 lb (70.3 kg)   Physical Exam Vitals reviewed.  Constitutional:      Appearance: She is not ill-appearing.  HENT:     Mouth/Throat:     Pharynx: No oropharyngeal exudate.  Pulmonary:     Effort: Pulmonary effort is normal. No respiratory distress.  Abdominal:     General: There is no distension.     Palpations: Abdomen is soft.  Musculoskeletal:        General: No tenderness.  Skin:    General: Skin is warm.     Coloration: Skin is not pale.  Neurological:     Mental Status: She is alert and oriented to person, place, and time.  Psychiatric:        Mood and Affect: Mood and affect normal.        Behavior: Behavior normal.    LABORATORY DATA:  I have reviewed the data as listed Lab Results  Component Value Date   WBC 14.1 (H) 02/23/2024   HGB 10.0 (L) 02/23/2024   HCT 32.1 (L) 02/23/2024   MCV 94.4 02/23/2024   PLT 253 02/23/2024   Recent Labs    12/28/23 0856 01/25/24 1002 02/23/24 1020  NA 136 138 137  K 4.0 4.1 4.2  CL 102 104 104  CO2 25 24 22   GLUCOSE 156* 135* 142*  BUN 23 21 19   CREATININE 1.13* 1.01* 1.02*  CALCIUM 9.2 9.4 9.2  GFRNONAA 51* 59* 58*  PROT 7.8 8.0 8.1  ALBUMIN 4.2 4.2 4.3  AST 22 20 23   ALT 16 16 15   ALKPHOS 46 62 42  BILITOT 0.9 0.6 0.8   No results found.  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 POSITIVE  For SF3B1; R-IPSS-low risk. MARCH 2023-bone marrow biopsy no evidence of  obvious progression of MDS or any acute process.  Currently  on Aranesp  100 mcg every 2-3 week plus Luspatercept . Last 07/05/23.    # Labs reviewed and acceptable for continuation of treatment. Proceed cycle # 29 luspatercept  today. Continue q4w given stability of her hemoglobin.    # Palpitations: [s/p w/u w/ Dr.Gollan 2023- Neg]-intermittent- consider OTC devices for monitoring-  stable    # Leukocytosis- predominant neutrophilia immature cells noted- peripheral blood flow cytometry APRIL 2025- No significant immunophenotypic abnormality detected. 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 S/p appt with [facility] rheumatology -status post hip injections pain improved- stable   # Vit D def- FEB 2024-Vitamin D - 25/low-  on Vit D 50-continue vitamin D  weekly. OCT 2024-  Vit D-34-recommend every other week. stable        # Peripheral neuropathy-M-protein- 2020- NEG.  ? Etiology- poor tolerance to cymablta. S/p evaluation with Dr.Vaslow. Currently waiting on gabapentin [Dr.Patel]- Monitor for now- stable    # CKD- stage III- 50s- monitor for now- stable   # Vaccination: s/p flu shot. Consider/recommend COVID/ RSV- stable   # handicapped parking.    PS  Disposition:  # today Luspatecept  # follow up in 4 weeks--labs (cbc, cmp, ldh), Dr Valentine Gasmen, luspatercept  SQ-  la   No problem-specific Assessment & Plan notes found for this encounter.  All questions were answered. The patient knows to call the clinic with any problems, questions or concerns.   Nelda Balsam, NP 02/23/2024

## 2024-02-23 NOTE — Addendum Note (Signed)
 Addended by: Vivi Grit on: 02/23/2024 03:07 PM   Modules accepted: Orders

## 2024-02-23 NOTE — Progress Notes (Signed)
 Patient has been doing ok. Her Blood pressure was a little elevated on her systolic, so I will recheck er BP manually in a couple of minutes. She has no new questions or concerns for the doctor today. She has no new symptoms to report today.

## 2024-03-05 ENCOUNTER — Other Ambulatory Visit (HOSPITAL_COMMUNITY): Payer: Self-pay

## 2024-03-05 ENCOUNTER — Emergency Department (HOSPITAL_COMMUNITY)
Admission: EM | Admit: 2024-03-05 | Discharge: 2024-03-06 | Disposition: A | Discharge status: Home / Self Care | Attending: Emergency Medicine | Admitting: Emergency Medicine

## 2024-03-05 ENCOUNTER — Encounter (HOSPITAL_COMMUNITY): Payer: Self-pay

## 2024-03-05 ENCOUNTER — Emergency Department (HOSPITAL_COMMUNITY)

## 2024-03-05 ENCOUNTER — Other Ambulatory Visit: Payer: Self-pay

## 2024-03-05 DIAGNOSIS — Z7982 Long term (current) use of aspirin: Secondary | ICD-10-CM | POA: Diagnosis not present

## 2024-03-05 DIAGNOSIS — W010XXA Fall on same level from slipping, tripping and stumbling without subsequent striking against object, initial encounter: Secondary | ICD-10-CM | POA: Diagnosis not present

## 2024-03-05 DIAGNOSIS — R519 Headache, unspecified: Secondary | ICD-10-CM | POA: Insufficient documentation

## 2024-03-05 DIAGNOSIS — S01512A Laceration without foreign body of oral cavity, initial encounter: Secondary | ICD-10-CM | POA: Diagnosis not present

## 2024-03-05 DIAGNOSIS — M50322 Other cervical disc degeneration at C5-C6 level: Secondary | ICD-10-CM | POA: Diagnosis not present

## 2024-03-05 DIAGNOSIS — M19012 Primary osteoarthritis, left shoulder: Secondary | ICD-10-CM | POA: Diagnosis not present

## 2024-03-05 DIAGNOSIS — S0990XA Unspecified injury of head, initial encounter: Secondary | ICD-10-CM | POA: Diagnosis not present

## 2024-03-05 DIAGNOSIS — S0993XA Unspecified injury of face, initial encounter: Secondary | ICD-10-CM

## 2024-03-05 DIAGNOSIS — S42202A Unspecified fracture of upper end of left humerus, initial encounter for closed fracture: Secondary | ICD-10-CM | POA: Diagnosis not present

## 2024-03-05 DIAGNOSIS — W19XXXA Unspecified fall, initial encounter: Secondary | ICD-10-CM

## 2024-03-05 DIAGNOSIS — S42292A Other displaced fracture of upper end of left humerus, initial encounter for closed fracture: Secondary | ICD-10-CM | POA: Insufficient documentation

## 2024-03-05 DIAGNOSIS — Z23 Encounter for immunization: Secondary | ICD-10-CM | POA: Insufficient documentation

## 2024-03-05 DIAGNOSIS — S80212A Abrasion, left knee, initial encounter: Secondary | ICD-10-CM | POA: Insufficient documentation

## 2024-03-05 DIAGNOSIS — M25562 Pain in left knee: Secondary | ICD-10-CM | POA: Diagnosis not present

## 2024-03-05 DIAGNOSIS — M47812 Spondylosis without myelopathy or radiculopathy, cervical region: Secondary | ICD-10-CM | POA: Diagnosis not present

## 2024-03-05 MED ORDER — FENTANYL CITRATE PF 50 MCG/ML IJ SOSY
25.0000 ug | PREFILLED_SYRINGE | Freq: Once | INTRAMUSCULAR | Status: AC
  Administered 2024-03-05: 25 ug via INTRAMUSCULAR
  Filled 2024-03-05: qty 1

## 2024-03-05 MED ORDER — TETANUS-DIPHTH-ACELL PERTUSSIS 5-2.5-18.5 LF-MCG/0.5 IM SUSY
0.5000 mL | PREFILLED_SYRINGE | Freq: Once | INTRAMUSCULAR | Status: AC
  Administered 2024-03-06: 0.5 mL via INTRAMUSCULAR
  Filled 2024-03-05: qty 0.5

## 2024-03-05 MED ORDER — OXYCODONE-ACETAMINOPHEN 5-325 MG PO TABS
1.0000 | ORAL_TABLET | Freq: Once | ORAL | Status: AC
  Administered 2024-03-05: 1 via ORAL
  Filled 2024-03-05: qty 1

## 2024-03-05 NOTE — Progress Notes (Signed)
 Orthopedic Tech Progress Note Patient Details:  Carla [name] 1951/01/27 [id number]  Ortho Devices Type of Ortho Device: Sling immobilizer Ortho Device/Splint Location: LUE Ortho Device/Splint Interventions: Application, Ordered   Post Interventions Patient Tolerated: Fair Instructions Provided: Care of device   Tanajah Boulter L Micaila Ziemba 03/05/2024, 10:42 PM

## 2024-03-05 NOTE — ED Provider Notes (Incomplete)
 [place] EMERGENCY DEPARTMENT AT [facility] Provider Note   CSN: [id number] Arrival date & time: 03/05/24  2020     History  Chief Complaint  Patient presents with  . Fall    Carla [name] is a 73 y.o. female with medical history of anemia.  Patient presents to ED for evaluation of fall.  States that earlier today she tripped over a cord in her garage while she was vacuuming her car out.  She states that she landed face forward on a cement floor and she struck the front of her forehead as well as her left shoulder.  She denies loss of consciousness, she denies blood thinners.  She presents to the ED complaining of facial pain and left shoulder pain.  Denies preceding chest pain or shortness of breath prior to fall.  Reports that she was immediately helped off the ground by her husband and transported to the ED.  She denies headache, neck pain, visual deficits, chest pain, shortness of breath, nausea or vomiting, photophobia.  Complaining of left shoulder pain.  No medications prior arrival.  Patient workup initiated in triage.   Fall       Home Medications Prior to Admission medications   Medication Sig Start Date End Date Taking? Authorizing Provider  acetaminophen  (TYLENOL ) 500 MG tablet Take 500-1,000 mg by mouth every 6 (six) hours as needed for mild pain or headache.    [provider]  aspirin EC 81 MG tablet Take 81 mg by mouth at bedtime. Swallow whole.     [provider]  Biotin 10 MG CAPS Take by mouth.    [provider]  calcium-vitamin D  (OSCAL WITH D) 500-5 MG-MCG tablet Take 1 tablet by mouth daily with breakfast.    [provider]  Darbepoetin Alfa  (ARANESP ) 500 MCG/ML SOSY injection Inject 500 mcg into the skin See admin instructions. Inject 500 mcg into the skin every week    [provider]  desonide (DESOWEN) 0.05 % cream Apply topically. 12/16/22   [provider]  gabapentin (NEURONTIN) 100 MG  capsule Take 1 capsule by mouth at bedtime. Patient not taking: Reported on 02/23/2024 06/15/23 06/14/24  [provider]  levothyroxine  (SYNTHROID ) 25 MCG tablet TAKE ONE TAB BY MOUTH ONCE DAILY. TAKE ON AN EMPTY STOMACH WITH A GLASS OF WATER ATLEAST 30-60 MINUTES BEFORE BREAKFAST 07/13/23   [name], Manley Seeds, MD  loratadine (CLARITIN) 10 MG tablet Take 10 mg by mouth daily.    [provider]  luspatercept -aamt (REBLOZYL ) 75 MG subcutaneous injection Inject into the skin.    [provider]  ondansetron  (ZOFRAN  ODT) 8 MG disintegrating tablet Take 1 tablet (8 mg total) by mouth every 8 (eight) hours as needed for nausea or vomiting. 05/20/20   Nelda Balsam, NP  Vitamin D , Ergocalciferol , (DRISDOL ) 1.25 MG (50000 UNIT) CAPS capsule Take 1 capsule (50,000 Units total) by mouth once a week. 10/05/23   Brahmanday, Govinda R, MD      Allergies    Codeine    Review of Systems   Review of Systems  All other systems reviewed and are negative.   Physical Exam Updated Vital Signs BP (!) 158/72 (BP Location: Right Arm)   Pulse 79   Temp (!) 97.5 F (36.4 C) (Oral)   Resp 18   Ht 5\' 3"  (1.6 m)   Wt 70.3 kg   SpO2 98%   BMI 27.45 kg/m  Physical Exam  ED Results / Procedures /  Treatments   Labs (all labs ordered are listed, but only abnormal results are displayed) Labs Reviewed - No data to display  EKG None  Radiology DG Knee Complete 4 Views Left Result Date: 03/05/2024 CLINICAL DATA:  Recent trip and fall with left knee pain, initial encounter EXAM: LEFT KNEE - COMPLETE 4+ VIEW COMPARISON:  None Available. FINDINGS: No evidence of fracture, dislocation, or joint effusion. No evidence of arthropathy or other focal bone abnormality. Soft tissues are unremarkable. IMPRESSION: Acute abnormality noted. Electronically Signed   By: Violeta Grey M.D.   On: 03/05/2024 22:01   DG Humerus Left Result Date: 03/05/2024 CLINICAL DATA:  Trip and fall with left arm pain, initial  encounter EXAM: LEFT HUMERUS - 2+ VIEW COMPARISON:  None Available. FINDINGS: There is a fracture through the surgical neck with impaction at the fracture site. Degenerative changes of the acromioclavicular joint are seen. IMPRESSION: Mildly impacted fracture of the proximal humerus. Electronically Signed   By: Violeta Grey M.D.   On: 03/05/2024 22:00   CT Head Wo Contrast Result Date: 03/05/2024 CLINICAL DATA:  Tripped over a car in the garage. Face it seem at floor. Pain in nose, teeth, left upper arm and elbow. EXAM: CT HEAD WITHOUT CONTRAST CT MAXILLOFACIAL WITHOUT CONTRAST CT CERVICAL SPINE WITHOUT CONTRAST TECHNIQUE: Multidetector CT imaging of the head, cervical spine, and maxillofacial structures were performed using the standard protocol without intravenous contrast. Multiplanar CT image reconstructions of the cervical spine and maxillofacial structures were also generated. RADIATION DOSE REDUCTION: This exam was performed according to the departmental dose-optimization program which includes automated exposure control, adjustment of the mA and/or kV according to patient size and/or use of iterative reconstruction technique. COMPARISON:  None Available. FINDINGS: CT HEAD FINDINGS Brain: No evidence of acute infarction, hemorrhage, hydrocephalus, extra-axial collection or mass lesion/mass effect. Vascular: No hyperdense vessel or unexpected calcification. Skull: Normal. Negative for fracture or focal lesion. Other: None. CT MAXILLOFACIAL FINDINGS Osseous: No fracture or mandibular dislocation. No destructive process. Orbits: Negative. No traumatic or inflammatory finding. Sinuses: Clear. Soft tissues: Negative. CT CERVICAL SPINE FINDINGS Alignment: No evidence of traumatic listhesis. Skull base and vertebrae: No acute fracture. Soft tissues and spinal canal: No prevertebral fluid or swelling. No visible canal hematoma. Disc levels: Multilevel spondylosis, disc space height loss, and degenerative endplate  change greatest at C5-C6 where it is moderate. Posterior disc osteophyte complexes at C4-C5 and C5-C6 cause mild to moderate effacement of the ventral thecal sac. No severe spinal canal narrowing. Upper chest: No acute abnormality. Other: None. IMPRESSION: 1. No acute intracranial abnormality. 2. No acute facial bone fracture. 3. No acute fracture in the cervical spine. Electronically Signed   By: Rozell Cornet M.D.   On: 03/05/2024 21:59   CT Cervical Spine Wo Contrast Result Date: 03/05/2024 CLINICAL DATA:  Tripped over a car in the garage. Face it seem at floor. Pain in nose, teeth, left upper arm and elbow. EXAM: CT HEAD WITHOUT CONTRAST CT MAXILLOFACIAL WITHOUT CONTRAST CT CERVICAL SPINE WITHOUT CONTRAST TECHNIQUE: Multidetector CT imaging of the head, cervical spine, and maxillofacial structures were performed using the standard protocol without intravenous contrast. Multiplanar CT image reconstructions of the cervical spine and maxillofacial structures were also generated. RADIATION DOSE REDUCTION: This exam was performed according to the departmental dose-optimization program which includes automated exposure control, adjustment of the mA and/or kV according to patient size and/or use of iterative reconstruction technique. COMPARISON:  None Available. FINDINGS: CT HEAD FINDINGS Brain: No evidence of acute  infarction, hemorrhage, hydrocephalus, extra-axial collection or mass lesion/mass effect. Vascular: No hyperdense vessel or unexpected calcification. Skull: Normal. Negative for fracture or focal lesion. Other: None. CT MAXILLOFACIAL FINDINGS Osseous: No fracture or mandibular dislocation. No destructive process. Orbits: Negative. No traumatic or inflammatory finding. Sinuses: Clear. Soft tissues: Negative. CT CERVICAL SPINE FINDINGS Alignment: No evidence of traumatic listhesis. Skull base and vertebrae: No acute fracture. Soft tissues and spinal canal: No prevertebral fluid or swelling. No visible  canal hematoma. Disc levels: Multilevel spondylosis, disc space height loss, and degenerative endplate change greatest at C5-C6 where it is moderate. Posterior disc osteophyte complexes at C4-C5 and C5-C6 cause mild to moderate effacement of the ventral thecal sac. No severe spinal canal narrowing. Upper chest: No acute abnormality. Other: None. IMPRESSION: 1. No acute intracranial abnormality. 2. No acute facial bone fracture. 3. No acute fracture in the cervical spine. Electronically Signed   By: Rozell Cornet M.D.   On: 03/05/2024 21:59   CT Maxillofacial WO CM Result Date: 03/05/2024 CLINICAL DATA:  Tripped over a car in the garage. Face it seem at floor. Pain in nose, teeth, left upper arm and elbow. EXAM: CT HEAD WITHOUT CONTRAST CT MAXILLOFACIAL WITHOUT CONTRAST CT CERVICAL SPINE WITHOUT CONTRAST TECHNIQUE: Multidetector CT imaging of the head, cervical spine, and maxillofacial structures were performed using the standard protocol without intravenous contrast. Multiplanar CT image reconstructions of the cervical spine and maxillofacial structures were also generated. RADIATION DOSE REDUCTION: This exam was performed according to the departmental dose-optimization program which includes automated exposure control, adjustment of the mA and/or kV according to patient size and/or use of iterative reconstruction technique. COMPARISON:  None Available. FINDINGS: CT HEAD FINDINGS Brain: No evidence of acute infarction, hemorrhage, hydrocephalus, extra-axial collection or mass lesion/mass effect. Vascular: No hyperdense vessel or unexpected calcification. Skull: Normal. Negative for fracture or focal lesion. Other: None. CT MAXILLOFACIAL FINDINGS Osseous: No fracture or mandibular dislocation. No destructive process. Orbits: Negative. No traumatic or inflammatory finding. Sinuses: Clear. Soft tissues: Negative. CT CERVICAL SPINE FINDINGS Alignment: No evidence of traumatic listhesis. Skull base and vertebrae: No  acute fracture. Soft tissues and spinal canal: No prevertebral fluid or swelling. No visible canal hematoma. Disc levels: Multilevel spondylosis, disc space height loss, and degenerative endplate change greatest at C5-C6 where it is moderate. Posterior disc osteophyte complexes at C4-C5 and C5-C6 cause mild to moderate effacement of the ventral thecal sac. No severe spinal canal narrowing. Upper chest: No acute abnormality. Other: None. IMPRESSION: 1. No acute intracranial abnormality. 2. No acute facial bone fracture. 3. No acute fracture in the cervical spine. Electronically Signed   By: Rozell Cornet M.D.   On: 03/05/2024 21:59   DG Shoulder Left Result Date: 03/05/2024 CLINICAL DATA:  Fall Pt was vacuuming car and tripped over cord, fell directly onto face and shoulder on the left side. EXAM: LEFT SHOULDER - 2+ VIEW COMPARISON:  None Available. FINDINGS: Acute displaced left humeral head and neck fracture. Acromioclavicular joint degenerative changes. Soft tissues are unremarkable. IMPRESSION: Acute displaced left humeral head and neck fracture. Electronically Signed   By: Morgane  Naveau M.D.   On: 03/05/2024 21:27    Procedures Procedures  {Document cardiac monitor, telemetry assessment procedure when appropriate:1}  Medications Ordered in ED Medications  oxyCODONE-acetaminophen  (PERCOCET/ROXICET) 5-325 MG per tablet 1 tablet (1 tablet Oral Given 03/05/24 2156)    ED Course/ Medical Decision Making/ A&P   {   Click here for ABCD2, HEART and other calculatorsREFRESH Note before signing :1}  Medical Decision Making Amount and/or Complexity of Data Reviewed Radiology: ordered.   ***  {Document critical care time when appropriate:1} {Document review of labs and clinical decision tools ie heart score, Chads2Vasc2 etc:1}  {Document your independent review of radiology images, and any outside records:1} {Document your discussion with family members, caretakers,  and with consultants:1} {Document social determinants of health affecting pt's care:1} {Document your decision making why or why not admission, treatments were needed:1} Final Clinical Impression(s) / ED Diagnoses Final diagnoses:  None    Rx / DC Orders ED Discharge Orders     None

## 2024-03-05 NOTE — ED Provider Triage Note (Signed)
 Emergency Medicine Provider Triage Evaluation Note  Carla [name] , a 74 y.o. female  was evaluated in triage.  Pt complains of injuries from a fall. Tripped over a car in the garage, hit face on the cement floor. Pain in nose, teeth, left upper arm/elbow, left knee. No LOC, not on thinners  Review of Systems  Positive:  Negative:   Physical Exam  BP (!) 158/72 (BP Location: Right Arm)   Pulse 79   Temp (!) 97.5 F (36.4 C) (Oral)   Resp 18   Ht 5\' 3"  (1.6 m)   Wt 70.3 kg   SpO2 98%   BMI 27.45 kg/m  Gen:   Awake, no distress   Resp:  Normal effort  MSK:   Ttp nose, upper teeth not obviously displaced, TTP distal humerus, elbow. TTP left knee with minor abrasion. No pain in neck/back Other:    Medical Decision Making  Medically screening exam initiated at 9:24 PM.  Appropriate orders placed.  Aymar Whitfill Cravens was informed that the remainder of the evaluation will be completed by another provider, this initial triage assessment does not replace that evaluation, and the importance of remaining in the ED until their evaluation is complete.     Darlis Eisenmenger, [name] 03/05/24 2125

## 2024-03-05 NOTE — ED Provider Notes (Signed)
 [place] EMERGENCY DEPARTMENT AT [facility] Provider Note   CSN: [id number] Arrival date & time: 03/05/24  2020     History  Chief Complaint  Patient presents with   Carla [name] is a 73 y.o. female with medical history of anemia.  Patient presents to ED for evaluation of fall.  States that earlier today she tripped over a cord in her garage while she was vacuuming her car out.  She states that she landed face forward on a cement floor and she struck the front of her forehead as well as her left shoulder.  She denies loss of consciousness, she denies blood thinners.  She presents to the ED complaining of facial pain and left shoulder pain.  Denies preceding chest pain or shortness of breath prior to fall.  Reports that she was immediately helped off the ground by her husband and transported to the ED.  She denies headache, neck pain, visual deficits, chest pain, shortness of breath, nausea or vomiting, photophobia.  Complaining of left shoulder pain.  No medications prior arrival.  Patient workup initiated in triage.   Fall Pertinent negatives include no chest pain, no headaches and no shortness of breath.       Home Medications Prior to Admission medications   Medication Sig Start Date End Date Taking? Authorizing Provider  HYDROcodone-acetaminophen  (NORCO/VICODIN) 5-325 MG tablet Take 1 tablet by mouth every 6 (six) hours as needed. 03/06/24  Yes Adel Aden, [name]  acetaminophen  (TYLENOL ) 500 MG tablet Take 500-1,000 mg by mouth every 6 (six) hours as needed for mild pain or headache.    [provider]  aspirin EC 81 MG tablet Take 81 mg by mouth at bedtime. Swallow whole.     [provider]  Biotin 10 MG CAPS Take by mouth.    [provider]  calcium-vitamin D  (OSCAL WITH D) 500-5 MG-MCG tablet Take 1 tablet by mouth daily with breakfast.    [provider]  Darbepoetin Alfa  (ARANESP ) 500 MCG/ML SOSY injection Inject  500 mcg into the skin See admin instructions. Inject 500 mcg into the skin every week    [provider]  desonide (DESOWEN) 0.05 % cream Apply topically. 12/16/22   [provider]  gabapentin (NEURONTIN) 100 MG capsule Take 1 capsule by mouth at bedtime. Patient not taking: Reported on 02/23/2024 06/15/23 06/14/24  [provider]  levothyroxine  (SYNTHROID ) 25 MCG tablet TAKE ONE TAB BY MOUTH ONCE DAILY. TAKE ON AN EMPTY STOMACH WITH A GLASS OF WATER ATLEAST 30-60 MINUTES BEFORE BREAKFAST 07/13/23   [name], Manley Seeds, MD  loratadine (CLARITIN) 10 MG tablet Take 10 mg by mouth daily.    [provider]  luspatercept -aamt (REBLOZYL ) 75 MG subcutaneous injection Inject into the skin.    [provider]  ondansetron  (ZOFRAN  ODT) 8 MG disintegrating tablet Take 1 tablet (8 mg total) by mouth every 8 (eight) hours as needed for nausea or vomiting. 05/20/20   Nelda Balsam, NP  Vitamin D , Ergocalciferol , (DRISDOL ) 1.25 MG (50000 UNIT) CAPS capsule Take 1 capsule (50,000 Units total) by mouth once a week. 10/05/23   Brahmanday, Govinda R, MD      Allergies    Codeine    Review of Systems   Review of Systems  HENT:         Facial pain  Respiratory:  Negative for shortness of breath.   Cardiovascular:  Negative for chest pain.  Gastrointestinal:  Negative for nausea and vomiting.  Musculoskeletal:  Positive for arthralgias and myalgias. Negative for neck pain.  Neurological:  Negative for headaches.  All other systems reviewed and are negative.   Physical Exam Updated Vital Signs BP (!) 158/72 (BP Location: Right Arm)   Pulse 79   Temp (!) 97.5 F (36.4 C) (Oral)   Resp 18   Ht 5\' 3"  (1.6 m)   Wt 70.3 kg   SpO2 98%   BMI 27.45 kg/m  Physical Exam Vitals and nursing note reviewed.  Constitutional:      General: She is not in acute distress.    Appearance: She is well-developed.  HENT:     Head: Normocephalic and atraumatic.     Comments: Small  laceration to oral mucosa Eyes:     Conjunctiva/sclera: Conjunctivae normal.  Cardiovascular:     Rate and Rhythm: Normal rate and regular rhythm.     Heart sounds: No murmur heard. Pulmonary:     Effort: Pulmonary effort is normal. No respiratory distress.     Breath sounds: Normal breath sounds.  Abdominal:     Palpations: Abdomen is soft.     Tenderness: There is no abdominal tenderness.  Musculoskeletal:        General: No swelling.     Cervical back: Neck supple.     Comments: Obvious swelling, reduced range of motion and discomfort to left shoulder.  No obvious deformity noted.  Patient left knee with moderate swelling compared to right however range of motion intact, no significant tenderness.  No bruising, overlying skin change, erythema.  Skin:    General: Skin is warm and dry.     Capillary Refill: Capillary refill takes less than 2 seconds.  Neurological:     General: No focal deficit present.     Mental Status: She is alert and oriented to person, place, and time. Mental status is at baseline.     GCS: GCS eye subscore is 4. GCS verbal subscore is 5. GCS motor subscore is 6.     Cranial Nerves: Cranial nerves 2-12 are intact. No cranial nerve deficit.     Sensory: Sensation is intact. No sensory deficit.     Motor: Motor function is intact. No weakness.     Coordination: Coordination is intact. Heel to [facility] Test normal.     Comments: CN III through XII intact.  Intact finger-nose, heel-to-shin.  No pronator drift, no slurred speech, no facial droop.  5 out of 5 strength bilateral upper extremities.  5 out of 5 strength bilateral lower extremities.  Pupils PERRL.  Tracks across midline.  Psychiatric:        Mood and Affect: Mood normal.     ED Results / Procedures / Treatments   Labs (all labs ordered are listed, but only abnormal results are displayed) Labs Reviewed - No data to display  EKG None  Radiology CT Shoulder Left Wo Contrast Result Date:  03/06/2024 CLINICAL DATA:  Fall, shoulder trauma, pain. EXAM: CT OF THE UPPER LEFT EXTREMITY WITHOUT CONTRAST TECHNIQUE: Multidetector CT imaging of the upper left extremity was performed according to the standard protocol. RADIATION DOSE REDUCTION: This exam was performed according to the departmental dose-optimization program which includes automated exposure control, adjustment of the mA and/or kV according to patient size and/or use of iterative reconstruction technique. COMPARISON:  Plain films today FINDINGS: Bones/Joint/Cartilage There is a mildly impacted left femoral neck fracture. Fracture extends through the superolateral humeral head in the region of the  greater tuberosity. No subluxation or dislocation. Ligaments Suboptimally assessed by CT. Muscles and Tendons Negative Soft tissues Negative IMPRESSION: Impacted left humeral neck fracture involving the greater tuberosity. Electronically Signed   By: Janeece Mechanic M.D.   On: 03/06/2024 00:22   CT Knee Left Wo Contrast Result Date: 03/06/2024 CLINICAL DATA:  Recent trip and fall with knee pain, negative x-ray EXAM: CT OF THE LEFT KNEE WITHOUT CONTRAST TECHNIQUE: Multidetector CT imaging of the left knee was performed according to the standard protocol. Multiplanar CT image reconstructions were also generated. RADIATION DOSE REDUCTION: This exam was performed according to the departmental dose-optimization program which includes automated exposure control, adjustment of the mA and/or kV according to patient size and/or use of iterative reconstruction technique. COMPARISON:  Knee x-ray from earlier in the same day. FINDINGS: Bones/Joint/Cartilage No acute fracture or dislocation is noted. No joint effusion is seen. Ligaments Suboptimally assessed by CT. Muscles and Tendons Surrounding musculature is within normal limits. Soft tissues Soft tissues are unremarkable. IMPRESSION: No acute abnormality in the left knee. Electronically Signed   By: Violeta Grey  M.D.   On: 03/06/2024 00:19   DG Knee Complete 4 Views Left Addendum Date: 03/06/2024 ADDENDUM REPORT: 03/06/2024 00:17 ADDENDUM: The impression should read: No acute abnormality noted. Electronically Signed   By: Violeta Grey M.D.   On: 03/06/2024 00:17   Result Date: 03/06/2024 CLINICAL DATA:  Recent trip and fall with left knee pain, initial encounter EXAM: LEFT KNEE - COMPLETE 4+ VIEW COMPARISON:  None Available. FINDINGS: No evidence of fracture, dislocation, or joint effusion. No evidence of arthropathy or other focal bone abnormality. Soft tissues are unremarkable. IMPRESSION: Acute abnormality noted. Electronically Signed: By: Violeta Grey M.D. On: 03/05/2024 22:01   DG Humerus Left Result Date: 03/05/2024 CLINICAL DATA:  Trip and fall with left arm pain, initial encounter EXAM: LEFT HUMERUS - 2+ VIEW COMPARISON:  None Available. FINDINGS: There is a fracture through the surgical neck with impaction at the fracture site. Degenerative changes of the acromioclavicular joint are seen. IMPRESSION: Mildly impacted fracture of the proximal humerus. Electronically Signed   By: Violeta Grey M.D.   On: 03/05/2024 22:00   CT Head Wo Contrast Result Date: 03/05/2024 CLINICAL DATA:  Tripped over a car in the garage. Face it seem at floor. Pain in nose, teeth, left upper arm and elbow. EXAM: CT HEAD WITHOUT CONTRAST CT MAXILLOFACIAL WITHOUT CONTRAST CT CERVICAL SPINE WITHOUT CONTRAST TECHNIQUE: Multidetector CT imaging of the head, cervical spine, and maxillofacial structures were performed using the standard protocol without intravenous contrast. Multiplanar CT image reconstructions of the cervical spine and maxillofacial structures were also generated. RADIATION DOSE REDUCTION: This exam was performed according to the departmental dose-optimization program which includes automated exposure control, adjustment of the mA and/or kV according to patient size and/or use of iterative reconstruction technique.  COMPARISON:  None Available. FINDINGS: CT HEAD FINDINGS Brain: No evidence of acute infarction, hemorrhage, hydrocephalus, extra-axial collection or mass lesion/mass effect. Vascular: No hyperdense vessel or unexpected calcification. Skull: Normal. Negative for fracture or focal lesion. Other: None. CT MAXILLOFACIAL FINDINGS Osseous: No fracture or mandibular dislocation. No destructive process. Orbits: Negative. No traumatic or inflammatory finding. Sinuses: Clear. Soft tissues: Negative. CT CERVICAL SPINE FINDINGS Alignment: No evidence of traumatic listhesis. Skull base and vertebrae: No acute fracture. Soft tissues and spinal canal: No prevertebral fluid or swelling. No visible canal hematoma. Disc levels: Multilevel spondylosis, disc space height loss, and degenerative endplate change greatest at C5-C6 where  it is moderate. Posterior disc osteophyte complexes at C4-C5 and C5-C6 cause mild to moderate effacement of the ventral thecal sac. No severe spinal canal narrowing. Upper chest: No acute abnormality. Other: None. IMPRESSION: 1. No acute intracranial abnormality. 2. No acute facial bone fracture. 3. No acute fracture in the cervical spine. Electronically Signed   By: Rozell Cornet M.D.   On: 03/05/2024 21:59   CT Cervical Spine Wo Contrast Result Date: 03/05/2024 CLINICAL DATA:  Tripped over a car in the garage. Face it seem at floor. Pain in nose, teeth, left upper arm and elbow. EXAM: CT HEAD WITHOUT CONTRAST CT MAXILLOFACIAL WITHOUT CONTRAST CT CERVICAL SPINE WITHOUT CONTRAST TECHNIQUE: Multidetector CT imaging of the head, cervical spine, and maxillofacial structures were performed using the standard protocol without intravenous contrast. Multiplanar CT image reconstructions of the cervical spine and maxillofacial structures were also generated. RADIATION DOSE REDUCTION: This exam was performed according to the departmental dose-optimization program which includes automated exposure control,  adjustment of the mA and/or kV according to patient size and/or use of iterative reconstruction technique. COMPARISON:  None Available. FINDINGS: CT HEAD FINDINGS Brain: No evidence of acute infarction, hemorrhage, hydrocephalus, extra-axial collection or mass lesion/mass effect. Vascular: No hyperdense vessel or unexpected calcification. Skull: Normal. Negative for fracture or focal lesion. Other: None. CT MAXILLOFACIAL FINDINGS Osseous: No fracture or mandibular dislocation. No destructive process. Orbits: Negative. No traumatic or inflammatory finding. Sinuses: Clear. Soft tissues: Negative. CT CERVICAL SPINE FINDINGS Alignment: No evidence of traumatic listhesis. Skull base and vertebrae: No acute fracture. Soft tissues and spinal canal: No prevertebral fluid or swelling. No visible canal hematoma. Disc levels: Multilevel spondylosis, disc space height loss, and degenerative endplate change greatest at C5-C6 where it is moderate. Posterior disc osteophyte complexes at C4-C5 and C5-C6 cause mild to moderate effacement of the ventral thecal sac. No severe spinal canal narrowing. Upper chest: No acute abnormality. Other: None. IMPRESSION: 1. No acute intracranial abnormality. 2. No acute facial bone fracture. 3. No acute fracture in the cervical spine. Electronically Signed   By: Rozell Cornet M.D.   On: 03/05/2024 21:59   CT Maxillofacial WO CM Result Date: 03/05/2024 CLINICAL DATA:  Tripped over a car in the garage. Face it seem at floor. Pain in nose, teeth, left upper arm and elbow. EXAM: CT HEAD WITHOUT CONTRAST CT MAXILLOFACIAL WITHOUT CONTRAST CT CERVICAL SPINE WITHOUT CONTRAST TECHNIQUE: Multidetector CT imaging of the head, cervical spine, and maxillofacial structures were performed using the standard protocol without intravenous contrast. Multiplanar CT image reconstructions of the cervical spine and maxillofacial structures were also generated. RADIATION DOSE REDUCTION: This exam was performed  according to the departmental dose-optimization program which includes automated exposure control, adjustment of the mA and/or kV according to patient size and/or use of iterative reconstruction technique. COMPARISON:  None Available. FINDINGS: CT HEAD FINDINGS Brain: No evidence of acute infarction, hemorrhage, hydrocephalus, extra-axial collection or mass lesion/mass effect. Vascular: No hyperdense vessel or unexpected calcification. Skull: Normal. Negative for fracture or focal lesion. Other: None. CT MAXILLOFACIAL FINDINGS Osseous: No fracture or mandibular dislocation. No destructive process. Orbits: Negative. No traumatic or inflammatory finding. Sinuses: Clear. Soft tissues: Negative. CT CERVICAL SPINE FINDINGS Alignment: No evidence of traumatic listhesis. Skull base and vertebrae: No acute fracture. Soft tissues and spinal canal: No prevertebral fluid or swelling. No visible canal hematoma. Disc levels: Multilevel spondylosis, disc space height loss, and degenerative endplate change greatest at C5-C6 where it is moderate. Posterior disc osteophyte complexes at C4-C5 and C5-C6  cause mild to moderate effacement of the ventral thecal sac. No severe spinal canal narrowing. Upper chest: No acute abnormality. Other: None. IMPRESSION: 1. No acute intracranial abnormality. 2. No acute facial bone fracture. 3. No acute fracture in the cervical spine. Electronically Signed   By: Rozell Cornet M.D.   On: 03/05/2024 21:59   DG Shoulder Left Result Date: 03/05/2024 CLINICAL DATA:  Fall Pt was vacuuming car and tripped over cord, fell directly onto face and shoulder on the left side. EXAM: LEFT SHOULDER - 2+ VIEW COMPARISON:  None Available. FINDINGS: Acute displaced left humeral head and neck fracture. Acromioclavicular joint degenerative changes. Soft tissues are unremarkable. IMPRESSION: Acute displaced left humeral head and neck fracture. Electronically Signed   By: Morgane  Naveau M.D.   On: 03/05/2024 21:27     Procedures Procedures    Medications Ordered in ED Medications  Tdap (BOOSTRIX) injection 0.5 mL (has no administration in time range)  oxyCODONE-acetaminophen  (PERCOCET/ROXICET) 5-325 MG per tablet 1 tablet (1 tablet Oral Given 03/05/24 2156)  fentaNYL  (SUBLIMAZE ) injection 25 mcg (25 mcg Intramuscular Given 03/05/24 2319)    ED Course/ Medical Decision Making/ A&P Clinical Course as of 03/06/24 0039  Tue Mar 06, 2024  0000 Augmentin BID 7 days for oral injury [CG]    Clinical Course User Index [CG] Adel Aden, [name]    Medical Decision Making Amount and/or Complexity of Data Reviewed Radiology: ordered.   73 year old female presents for evaluation.  Please see HPI for further details.  On examination the patient is afebrile and nontachycardic.  Lung sounds are clear bilaterally, she is not hypoxic.  Abdomen is soft and compressible.  Neuroexam at baseline.  Patient left knee with slight superficial abrasion however no obvious deformity.  Her left knee is slightly swollen compared to the right.  She has intact range of motion without significant tenderness.  She has reduced range of motion to her left shoulder without deformity.  She does have exquisite tenderness and is unable to perform or participate in any kind of full ranging activities or assessment.  She has no abdominal tenderness.  Her neurological examination is otherwise unremarkable.  She does have small oral injury to her oral mucosa noted.  Tdap updated.  Patient workup initiated in triage includes plain film imaging of left shoulder, CT head, CT cervical spine, CT maxillofacial.  Plain film imaging of left humerus, left knee.  Patient CT head, cervical spine, maxillofacial showed no acute process or traumatic injury.  The patient x-ray of her left knee shows no injury.  The patient plain film image of her left shoulder shows an acute displaced and comminuted left humeral head and neck fracture.  Patient was  given 5 mg oxycodone in triage.  I provided the patient with 25 mcg fentanyl  for further pain control.  Reports pain is reduced at this time after fentanyl .  Discussed with attending, Dr. Marine Sia, who added on CT of left shoulder as well as CT left knee.  CT left shoulder once again confirms previously known injury.  No new features noted on CT scan.  CT of left knee unremarkable, negative for acute fracture.  At this time patient placed in shoulder sling and swath.  Will be sent home with pain medication and referred to Dr. Hulda Mage of [facility]. Return precautions given, she voiced understanding. Patient stable to dc home.    Final Clinical Impression(s) / ED Diagnoses Final diagnoses:  Fall, initial encounter  Injury of mouth, initial encounter  Closed fracture of head of left humerus, initial encounter    Rx / DC Orders ED Discharge Orders          Ordered    HYDROcodone-acetaminophen  (NORCO/VICODIN) 5-325 MG tablet  Every 6 hours PRN        03/06/24 0037              Adel Aden, [name] 03/06/24 1610    Wynetta Heckle, MD 03/06/24 1926

## 2024-03-05 NOTE — ED Triage Notes (Signed)
 Pt states that she tripped on a cord while vacuuming her car around 1830 today. No thinners. No LOC. Pt complains of severe L shoulder pain. Pt also complains of nose and lip pain.

## 2024-03-06 ENCOUNTER — Other Ambulatory Visit (HOSPITAL_COMMUNITY)

## 2024-03-06 ENCOUNTER — Emergency Department (HOSPITAL_COMMUNITY)

## 2024-03-06 DIAGNOSIS — S42295A Other nondisplaced fracture of upper end of left humerus, initial encounter for closed fracture: Secondary | ICD-10-CM | POA: Diagnosis not present

## 2024-03-06 DIAGNOSIS — M25562 Pain in left knee: Secondary | ICD-10-CM | POA: Diagnosis not present

## 2024-03-06 DIAGNOSIS — S42292A Other displaced fracture of upper end of left humerus, initial encounter for closed fracture: Secondary | ICD-10-CM | POA: Diagnosis not present

## 2024-03-06 MED ORDER — HYDROCODONE-ACETAMINOPHEN 5-325 MG PO TABS: 1.0000 | ORAL_TABLET | Freq: Four times a day (QID) | ORAL | 0 refills | Status: DC | PRN

## 2024-03-06 MED ORDER — ONDANSETRON 4 MG PO TBDP: 4.0000 mg | ORAL_TABLET | Freq: Three times a day (TID) | ORAL | 0 refills | Status: DC | PRN

## 2024-03-06 MED ORDER — AMOXICILLIN-POT CLAVULANATE 875-125 MG PO TABS: 1.0000 | ORAL_TABLET | Freq: Two times a day (BID) | ORAL | 0 refills | Status: DC

## 2024-03-06 NOTE — Discharge Instructions (Addendum)
 It was a pleasure taking part in your care.  As discussed, you have a fracture in your left humerus.  This is the bone in your upper arm.  Please remain in the shoulder sling until seen by orthopedic doctor.  I am referring you to Dr. Hulda Mage, please follow-up with him.  If you have an orthopedic provider you prefer you Nin call them for follow-up.  Please take pain medication at home to include ibuprofen and Tylenol  every 6 hours.  If this does not reduce pain, you Beamer proceed to narcotic pain medication, hydrocodone.  Please take 5 mg every 6 hours.  Do not drive, operate machinery or mix with alcohol.  Please be aware this medication will sedate you and cause you to become drowsy.  Return to the ED with any new or worsening symptoms.

## 2024-03-13 ENCOUNTER — Telehealth: Payer: Self-pay | Admitting: *Deleted

## 2024-03-13 DIAGNOSIS — S42295D Other nondisplaced fracture of upper end of left humerus, subsequent encounter for fracture with routine healing: Secondary | ICD-10-CM | POA: Diagnosis not present

## 2024-03-13 NOTE — Telephone Encounter (Signed)
 I spoke with pt and Dr. Jarrell Merritts office, per Dr. Geraldene Kleine, he is ok with r/s the luspatercept  one week after her surgery. Felipa Horsfall will call tomorrow to get this r/s.

## 2024-03-13 NOTE — Telephone Encounter (Signed)
 The doctor's office from [facility] needs to do surgery on her on June 25.  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  She has a injection luspatercept  to be done June 26.  They are wanting to know if any instructions is needed since she is going to get the injury and then the next day have the luspatercept  injection the next day  .He would like to have someone to call to go over any issues

## 2024-03-14 NOTE — Progress Notes (Signed)
 Sent message, via epic in basket, requesting orders in epic from Careers adviser.

## 2024-03-15 NOTE — Progress Notes (Addendum)
 COVID Vaccine received:  []  No [x]  Yes Date of any COVID positive Test in last 90 days: no PCP - Deri Fleet MD Cardiologist - Dr. Jerelene Monday in [facility]  Chest x-ray -  EKG -   Stress Test -  ECHO - 06/06/20 Epic Cardiac Cath -   Bowel Prep - [x]  No  []   Yes ______  Pacemaker / ICD device [x]  No []  Yes   Spinal Cord Stimulator:[x]  No []  Yes       History of Sleep Apnea? [x]  No []  Yes   CPAP used?- [x]  No []  Yes    Does the patient monitor blood sugar?          [x]  No []  Yes  []  N/A  Patient has: [x]  NO Hx DM   []  Pre-DM                 []  DM1  []   DM2 Does patient have a [employer] or Dexacom? []  No []  Yes   Fasting Blood Sugar Ranges-  Checks Blood Sugar _____ times a day  GLP1 agonist / usual dose - no GLP1 instructions:  SGLT-2 inhibitors / usual dose - no SGLT-2 instructions:   Blood Thinner / Instructions:no Aspirin Instructions:ASA 81 mg last dose 6/17.  Comments:   Activity level: Patient is unable  to climb a flight of stairs without difficulty; [x]  No CP  [x]  No SOB,    Patient can not perform ADLs without assistance.   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  Patient verbalized understanding and agreement to the Pre-Surgical Instructions that were given to them at this PAT appointment. Patient was also educated of the need to review these PAT instructions again prior to his/her surgery.I reviewed the appropriate phone numbers to call if they have any and questions or concerns.

## 2024-03-15 NOTE — Patient Instructions (Addendum)
 SURGICAL WAITING ROOM VISITATION  Patients having surgery or a procedure [name] have no more than 2 support people in the waiting area - these visitors [name] rotate.    Children under the age of 54 must have an adult with them who is not the patient.  Visitors with respiratory illnesses are discouraged from visiting and should remain at home.  If the patient needs to stay at the hospital during part of their recovery, the visitor guidelines for inpatient rooms apply. Pre-op nurse will coordinate an appropriate time for 1 support person to accompany patient in pre-op.  This support person [name] not rotate.    Please refer to the [facility] website for the visitor guidelines for Inpatients (after your surgery is over and you are in a regular room).       Your procedure is scheduled on: 03/21/24   Report to [facility] Main Entrance    Report to admitting at 5:15AM   Call this number if you have problems the morning of surgery [phone]   Do not eat food or drink liquids :After Midnight. But [name] have sips of water with meds.      Oral Hygiene is also important to reduce your risk of infection.                                    Remember - BRUSH YOUR TEETH THE MORNING OF SURGERY WITH YOUR REGULAR TOOTHPASTE  DENTURES WILL BE REMOVED PRIOR TO SURGERY PLEASE DO NOT APPLY Poly grip OR ADHESIVES!!!   Stop all vitamins and herbal supplements 7 days before surgery.   Take these medicines the morning of surgery with A SIP OF WATER: Hydrocodone , synthroid , loratadine(claritin), tylenol  if needed.             You [name] not have any metal on your body including hair pins, jewelry, and body piercing             Do not wear make-up, lotions, powders, perfumes/cologne, or deodorant  Do not wear nail polish including gel and S&S, artificial/acrylic nails, or any other type of covering on natural nails including finger and toenails. If you have artificial nails, gel coating, etc. that needs  to be removed by a nail salon please have this removed prior to surgery or surgery [name] need to be canceled/ delayed if the surgeon/ anesthesia feels like they are unable to be safely monitored.   Do not shave  48 hours prior to surgery.    Do not bring valuables to the hospital. [name] IS NOT             RESPONSIBLE   FOR VALUABLES.   Contacts, glasses, dentures or bridgework [name] not be worn into surgery.  DO NOT BRING YOUR HOME MEDICATIONS TO THE HOSPITAL. PHARMACY WILL DISPENSE MEDICATIONS LISTED ON YOUR MEDICATION LIST TO YOU DURING YOUR ADMISSION IN THE HOSPITAL!    Patients discharged on the day of surgery will not be allowed to drive home.  Someone NEEDS to stay with you for the first 24 hours after anesthesia.   Special Instructions: Bring a copy of your healthcare power of attorney and living will documents the day of surgery if you haven't scanned them before.              Please read over the following fact sheets you were given: IF YOU HAVE QUESTIONS ABOUT YOUR PRE-OP INSTRUCTIONS PLEASE  CALL [phone] Carla [name]   If you received a COVID test during your pre-op visit  it is requested that you wear a mask when out in public, stay away from anyone that [name] not be feeling well and notify your surgeon if you develop symptoms. If you test positive for Covid or have been in contact with anyone that has tested positive in the last 10 days please notify you surgeon.      Pre-operative 5 CHG Bath Instructions   You can play a key role in reducing the risk of infection after surgery. Your skin needs to be as free of germs as possible. You can reduce the number of germs on your skin by washing with CHG (chlorhexidine gluconate) soap before surgery. CHG is an antiseptic soap that kills germs and continues to kill germs even after washing.   DO NOT use if you have an allergy to chlorhexidine/CHG or antibacterial soaps. If your skin becomes reddened or irritated, stop using the CHG and  notify one of our RNs at [phone].   Please shower with the CHG soap starting 4 days before surgery using the following schedule:     Please keep in mind the following:  DO NOT shave, including legs and underarms, starting the day of your first shower.   You [name] shave your face at any point before/day of surgery.  Place clean sheets on your bed the day you start using CHG soap. Use a clean washcloth (not used since being washed) for each shower. DO NOT sleep with pets once you start using the CHG.   CHG Shower Instructions:  If you choose to wash your hair and private area, wash first with your normal shampoo/soap.  After you use shampoo/soap, rinse your hair and body thoroughly to remove shampoo/soap residue.  Turn the water OFF and apply about 3 tablespoons (45 ml) of CHG soap to a CLEAN washcloth.  Apply CHG soap ONLY FROM YOUR NECK DOWN TO YOUR TOES (washing for 3-5 minutes)  DO NOT use CHG soap on face, private areas, open wounds, or sores.  Pay special attention to the area where your surgery is being performed.  If you are having back surgery, having someone wash your back for you [name] be helpful. Wait 2 minutes after CHG soap is applied, then you [name] rinse off the CHG soap.  Pat dry with a clean towel  Put on clean clothes/pajamas   If you choose to wear lotion, please use ONLY the CHG-compatible lotions on the back of this paper.     Additional instructions for the day of surgery: DO NOT APPLY any lotions, deodorants, cologne, or perfumes.   Put on clean/comfortable clothes.  Brush your teeth.  Ask your nurse before applying any prescription medications to the skin.      CHG Compatible Lotions   Aveeno Moisturizing lotion  Cetaphil Moisturizing Cream  Cetaphil Moisturizing Lotion  Clairol Herbal Essence Moisturizing Lotion, Dry Skin  Clairol Herbal Essence Moisturizing Lotion, Extra Dry Skin  Clairol Herbal Essence Moisturizing Lotion, Normal Skin  Curel Age  Defying Therapeutic Moisturizing Lotion with Alpha Hydroxy  Curel Extreme Care Body Lotion  Curel Soothing Hands Moisturizing Hand Lotion  Curel Therapeutic Moisturizing Cream, Fragrance-Free  Curel Therapeutic Moisturizing Lotion, Fragrance-Free  Curel Therapeutic Moisturizing Lotion, Original Formula  Eucerin Daily Replenishing Lotion  Eucerin Dry Skin Therapy Plus Alpha Hydroxy Crme  Eucerin Dry Skin Therapy Plus Alpha Hydroxy Lotion  Eucerin Original Crme  Eucerin Original Lotion  Eucerin Plus  Crme Eucerin Plus Lotion  Eucerin TriLipid Replenishing Lotion  Keri Anti-Bacterial Hand Lotion  Keri Deep Conditioning Original Lotion Dry Skin Formula Softly Scented  Keri Deep Conditioning Original Lotion, Fragrance Free Sensitive Skin Formula  Keri Lotion Fast Absorbing Fragrance Free Sensitive Skin Formula  Keri Lotion Fast Absorbing Softly Scented Dry Skin Formula  Keri Original Lotion  Keri Skin Renewal Lotion Keri Silky Smooth Lotion  Keri Silky Smooth Sensitive Skin Lotion  Nivea Body Creamy Conditioning Oil  Nivea Body Extra Enriched Lotion  Nivea Body Original Lotion  Nivea Body Sheer Moisturizing Lotion Nivea Crme  Nivea Skin Firming Lotion  NutraDerm 30 Skin Lotion  NutraDerm Skin Lotion  NutraDerm Therapeutic Skin Cream  NutraDerm Therapeutic Skin Lotion  ProShield Protective Hand Cream   Incentive Spirometer An incentive spirometer is a tool that can help keep your lungs clear and active. This tool measures how well you are filling your lungs with each breath. Taking long deep breaths Marmo help reverse or decrease the chance of developing breathing (pulmonary) problems (especially infection) following: A long period of time when you are unable to move or be active. BEFORE THE PROCEDURE  If the spirometer includes an indicator to show your best effort, your nurse or respiratory therapist will set it to a desired goal. If possible, sit up straight or lean slightly  forward. Try not to slouch. Hold the incentive spirometer in an upright position. INSTRUCTIONS FOR USE  Sit on the edge of your bed if possible, or sit up as far as you can in bed or on a chair. Hold the incentive spirometer in an upright position. Breathe out normally. Place the mouthpiece in your mouth and seal your lips tightly around it. Breathe in slowly and as deeply as possible, raising the piston or the ball toward the top of the column. Hold your breath for 3-5 seconds or for as long as possible. Allow the piston or ball to fall to the bottom of the column. Remove the mouthpiece from your mouth and breathe out normally. Rest for a few seconds and repeat Steps 1 through 7 at least 10 times every 1-2 hours when you are awake. Take your time and take a few normal breaths between deep breaths. The spirometer Capriotti include an indicator to show your best effort. Use the indicator as a goal to work toward during each repetition. After each set of 10 deep breaths, practice coughing to be sure your lungs are clear. If you have an incision (the cut made at the time of surgery), support your incision when coughing by placing a pillow or rolled up towels firmly against it. Once you are able to get out of bed, walk around indoors and cough well. You Wigle stop using the incentive spirometer when instructed by your caregiver.  RISKS AND COMPLICATIONS Take your time so you do not get dizzy or light-headed. If you are in pain, you Nakama need to take or ask for pain medication before doing incentive spirometry. It is harder to take a deep breath if you are having pain. AFTER USE Rest and breathe slowly and easily. It can be helpful to keep track of a log of your progress. Your caregiver can provide you with a simple table to help with this. If you are using the spirometer at home, follow these instructions: SEEK MEDICAL CARE IF:  You are having difficultly using the spirometer. You have trouble using the  spirometer as often as instructed. Your pain medication is not giving  enough relief while using the spirometer. You develop fever of 100.5 F (38.1 C) or higher. SEEK IMMEDIATE MEDICAL CARE IF:  You cough up bloody sputum that had not been present before. You develop fever of 102 F (38.9 C) or greater. You develop worsening pain at or near the incision site. MAKE SURE YOU:  Understand these instructions. Will watch your condition. Will get help right away if you are not doing well or get worse. Document Released: 01/24/2007 Document Revised: 12/06/2011 Document Reviewed: 03/27/2007 [facility] Patient Information 2014 [place], [place]- Preparing for Total Shoulder Arthroplasty    Before surgery, you can play an important role. Because skin is not sterile, your skin needs to be as free of germs as possible. You can reduce the number of germs on your skin by using the following products. Benzoyl Peroxide Gel Reduces the number of germs present on the skin Applied twice a day to shoulder area starting two days before surgery    ==================================================================  Please follow these instructions carefully:  BENZOYL PEROXIDE 5% GEL  Please do not use if you have an allergy to benzoyl peroxide.   If your skin becomes reddened/irritated stop using the benzoyl peroxide.  Starting two days before surgery, apply as follows: Apply benzoyl peroxide in the morning and at night. Apply after taking a shower. If you are not taking a shower clean entire shoulder front, back, and side along with the armpit with a clean wet washcloth.  Place a quarter-sized dollop on your shoulder and rub in thoroughly, making sure to cover the front, back, and side of your shoulder, along with the armpit.   2 days before ____ AM   ____ PM              1 day before ____ AM   ____ PM                         Do this twice a day for two days.  (Last application is the night  before surgery, AFTER using the CHG soap as described below).  Do NOT apply benzoyl peroxide gel on the day of surgery.

## 2024-03-15 NOTE — H&P (Signed)
 PREOPERATIVE H&P  Chief Complaint: closed displaced fracture of proximal end of left humerus  HPI: Carla [name] is a 73 y.o. female who is scheduled for Procedure(s): ARTHROPLASTY, SHOULDER, TOTAL, REVERSE.   Patient has a past medical history significant for myelodysplastic syndrome.   Patient had a fall on 03/05/24 where she tripped over a cord and landed on her left shoulder. She had immediate pain. She went to [facility] ED. X-rays showed a displaced proximal humerus fracture.  Symptoms are rated as moderate to severe, and have been worsening.  This is significantly impairing activities of daily living.    Please see clinic note for further details on this patient's care.    She has elected for surgical management.   Past Medical History:  Diagnosis Date   Allergic rhinitis, cause unspecified    Anemia, unspecified    Carpal tunnel syndrome    Cystitis, unspecified    Family history of osteoporosis    PONV (postoperative nausea and vomiting)    Postnasal drip    Syncope and collapse    Past Surgical History:  Procedure Laterality Date   BONE MARROW BIOPSY     CARPAL TUNNEL RELEASE     COLONOSCOPY  09/28/2003   COLONOSCOPY WITH PROPOFOL  N/A 07/31/2015   Procedure: COLONOSCOPY WITH PROPOFOL ;  Surgeon: Lanita Pitman, MD;  Location: [facility];  Service: Endoscopy;  Laterality: N/A;   DIAGNOSTIC LAPAROSCOPY     dx lack of pregnancy    EYE SURGERY     lasik, cataract, prk,yag procedure   Social History   Socioeconomic History   Marital status: Married    Spouse name: Not on file   Number of children: Not on file   Years of education: Not on file   Highest education level: Not on file  Occupational History   Not on file  Tobacco Use   Smoking status: Never   Smokeless tobacco: Never  Vaping Use   Vaping status: Never Used  Substance and Sexual Activity   Alcohol use: Yes    Alcohol/week: 0.0 standard drinks of alcohol    Comment: Rare   Drug use: No    Sexual activity: Not Currently  Other Topics Concern   Not on file  Social History Narrative   No regular exercise      Drinks lots of Pepsi         Social Drivers of Health   Financial Resource Strain: Low Risk  (05/23/2023)   Overall Financial Resource Strain (CARDIA)    Difficulty of Paying Living Expenses: Not hard at all  Food Insecurity: No Food Insecurity (05/23/2023)   Hunger Vital Sign    Worried About Running Out of Food in the Last Year: Never true    Ran Out of Food in the Last Year: Never true  Transportation Needs: No Transportation Needs (05/23/2023)   PRAPARE - Administrator, Civil Service (Medical): No    Lack of Transportation (Non-Medical): No  Physical Activity: Inactive (05/23/2023)   Exercise Vital Sign    Days of Exercise per Week: 0 days    Minutes of Exercise per Session: 0 min  Stress: No Stress Concern Present (05/23/2023)   [employer] of Occupational Health - Occupational Stress Questionnaire    Feeling of Stress : Not at all  Social Connections: Moderately Integrated (05/23/2023)   Social Connection and Isolation Panel    Frequency of Communication with Friends and Family: More than three times a week  Frequency of Social Gatherings with Friends and Family: More than three times a week    Attends Religious Services: More than 4 times per year    Active Member of [employer] or Organizations: No    Attends Engineer, structural: Never    Marital Status: Married   Family History  Problem Relation Age of Onset   Osteoporosis Mother    Stroke Mother    Coronary artery disease Father    Stroke Father 63   Diabetes Other        Aunts and uncles   Breast cancer Paternal Aunt    Breast cancer Maternal Aunt    Arthritis/Rheumatoid Child    Breast cancer Cousin    Allergies  Allergen Reactions   Codeine Nausea Only   Prior to Admission medications   Medication Sig Start Date End Date Taking? Authorizing Provider   acetaminophen  (TYLENOL ) 500 MG tablet Take 1,000 mg by mouth every 8 (eight) hours as needed for mild pain (pain score 1-3) or headache.   Yes [provider]  levothyroxine  (SYNTHROID ) 25 MCG tablet TAKE ONE TAB BY MOUTH ONCE DAILY. TAKE ON AN EMPTY STOMACH WITH A GLASS OF WATER ATLEAST 30-60 MINUTES BEFORE BREAKFAST 07/13/23  Yes [name], Marne A, MD  loratadine (CLARITIN) 10 MG tablet Take 10 mg by mouth at bedtime.   Yes [provider]  ondansetron  (ZOFRAN -ODT) 4 MG disintegrating tablet Take 1 tablet (4 mg total) by mouth every 8 (eight) hours as needed for nausea or vomiting. 03/06/24  Yes Adel Aden, [name]  oxyCODONE  (OXY IR/ROXICODONE ) 5 MG immediate release tablet Take 2.5 mg by mouth every 6 (six) hours as needed for moderate pain (pain score 4-6). 03/14/24  Yes [provider]  promethazine  (PHENERGAN ) 25 MG tablet Take 25 mg by mouth every 6 (six) hours as needed for vomiting or nausea. 03/14/24  Yes [provider]  amoxicillin -clavulanate (AUGMENTIN ) 875-125 MG tablet Take 1 tablet by mouth every 12 (twelve) hours. Patient not taking: Reported on 03/15/2024 03/06/24   Adel Aden, [name]  aspirin EC 81 MG tablet Take 81 mg by mouth at bedtime. Swallow whole.     [provider]  calcium-vitamin D  (OSCAL WITH D) 500-5 MG-MCG tablet Take 1 tablet by mouth daily with breakfast.    [provider]  Darbepoetin Alfa  (ARANESP ) 500 MCG/ML SOSY injection Inject 500 mcg into the skin See admin instructions. Inject 500 mcg into the skin every week    [provider]  HYDROcodone -acetaminophen  (NORCO/VICODIN) 5-325 MG tablet Take 1 tablet by mouth every 6 (six) hours as needed. Patient not taking: Reported on 03/15/2024 03/06/24   Adel Aden, [name]  luspatercept -aamt (REBLOZYL ) 75 MG subcutaneous injection Inject into the skin.    [provider]  Vitamin D , Ergocalciferol , (DRISDOL ) 1.25 MG (50000 UNIT) CAPS  capsule Take 1 capsule (50,000 Units total) by mouth once a week. 10/05/23   Gwyn Leos, MD    ROS: All other systems have been reviewed and were otherwise negative with the exception of those mentioned in the HPI and as above.  Physical Exam: General: Alert, no acute distress Cardiovascular: No pedal edema Respiratory: No cyanosis, no use of accessory musculature GI: No organomegaly, abdomen is soft and non-tender Skin: No lesions in the area of chief complaint Neurologic: Sensation intact distally Psychiatric: Patient is competent for consent with normal mood and affect Lymphatic: No axillary or cervical lymphadenopathy  MUSCULOSKELETAL:  Range of motion of the  shoulder is untested.  Axillary nerve sensation appears to be intact.    Imaging: Xrays demostrate a displaced proximal humerus fracture  BMI: Estimated body mass index is 27.45 kg/m as calculated from the following:   Height as of 03/05/24: 5' 3 (1.6 m).   Weight as of 03/05/24: 70.3 kg.  Lab Results  Component Value Date   ALBUMIN 4.3 02/23/2024   Diabetes: Patient does not have a diagnosis of diabetes. Lab Results  Component Value Date   HGBA1C 5.8 07/06/2023     Smoking Status:   reports that she has never smoked. She has never used smokeless tobacco.     Assessment: closed displaced fracture of proximal end of left humerus  Plan: Plan for Procedure(s): ARTHROPLASTY, SHOULDER, TOTAL, REVERSE  The risks benefits and alternatives were discussed with the patient including but not limited to the risks of nonoperative treatment, versus surgical intervention including infection, bleeding, nerve injury,  blood clots, cardiopulmonary complications, morbidity, mortality, among others, and they were willing to proceed.   We additionally specifically discussed risks of axillary nerve injury, infection, periprosthetic fracture, continued pain and longevity of implants prior to beginning procedure.     Patient will be closely monitored in PACU for medical stabilization and pain control. If found stable in PACU, patient Kielty be discharged home with outpatient follow-up. If any concerns regarding patient's stabilization patient will be admitted for observation after surgery. The patient is planning to be discharged home with outpatient PT.   The patient acknowledged the explanation, agreed to proceed with the plan and consent was signed.   Operative Plan: Left reverse total shoulder arthroplasty for fracture Discharge Medications: standard DVT Prophylaxis: aspirin Physical Therapy: delayed therapy  Special Discharge needs: Sling. IceMan   Adine Ahmadi, [name]  03/15/2024 12:34 PM

## 2024-03-16 ENCOUNTER — Encounter (HOSPITAL_COMMUNITY): Payer: Self-pay

## 2024-03-16 ENCOUNTER — Encounter (HOSPITAL_COMMUNITY)
Admission: RE | Admit: 2024-03-16 | Discharge: 2024-03-16 | Disposition: A | Discharge status: Home / Self Care | Source: Ambulatory Visit | Attending: Orthopaedic Surgery | Admitting: Orthopaedic Surgery

## 2024-03-16 ENCOUNTER — Other Ambulatory Visit: Payer: Self-pay

## 2024-03-16 VITALS — BP 135/68 | HR 72 | Temp 97.8°F | Resp 18 | Ht 64.0 in | Wt 155.0 lb

## 2024-03-16 DIAGNOSIS — S42292A Other displaced fracture of upper end of left humerus, initial encounter for closed fracture: Secondary | ICD-10-CM | POA: Insufficient documentation

## 2024-03-16 DIAGNOSIS — E039 Hypothyroidism, unspecified: Secondary | ICD-10-CM | POA: Diagnosis not present

## 2024-03-16 DIAGNOSIS — Z01818 Encounter for other preprocedural examination: Secondary | ICD-10-CM

## 2024-03-16 DIAGNOSIS — D469 Myelodysplastic syndrome, unspecified: Secondary | ICD-10-CM | POA: Insufficient documentation

## 2024-03-16 DIAGNOSIS — Z01812 Encounter for preprocedural laboratory examination: Secondary | ICD-10-CM | POA: Diagnosis not present

## 2024-03-16 DIAGNOSIS — X58XXXA Exposure to other specified factors, initial encounter: Secondary | ICD-10-CM | POA: Insufficient documentation

## 2024-03-16 HISTORY — DX: Unspecified osteoarthritis, unspecified site: M19.90

## 2024-03-16 HISTORY — DX: Malignant (primary) neoplasm, unspecified: C80.1

## 2024-03-16 HISTORY — DX: Hypothyroidism, unspecified: E03.9

## 2024-03-16 LAB — SURGICAL PCR SCREEN
MRSA, PCR: NEGATIVE
Staphylococcus aureus: NEGATIVE

## 2024-03-19 NOTE — Progress Notes (Signed)
 Anesthesia Chart Review   Case: [id number] Date/Time: 03/21/24 0715   Procedure: ARTHROPLASTY, SHOULDER, TOTAL, REVERSE (Left: Shoulder)   Anesthesia type: General   Diagnosis: Other closed displaced fracture of proximal end of left humerus, initial encounter [S42.292A]   Pre-op diagnosis: closed displaced fracture of proximal end of left humerus   [facility] ROOM 06 / [facility]   Surgeons: Carla Bonner DASEN, MD       DISCUSSION:72 y.o. never smoker with h/o PONV, hypothyroidism, low grade myelodysplastic syndrome (Currently on Aranesp  100 mcg every 2-3 week plus Luspatercept ), left humerus fracture scheduled for above procedure 03/21/2024 with Dr. Bonner Carla.   Pt s/p mechanical fall 03/05/2024, left humerus fracture.   Pt functional status is limited by joint pain, followed by rheumatology.  Reports no shortness of breath or chest pain with activity.   Pt evaluated by cardiology in 2023 for palpitations.  Event monitor with no arrhythmias, NSR.  Echo September 2021 normal ejection fraction, EF 60%, no significant valvular heart disease at that time VS: BP 135/68   Pulse 72   Temp 36.6 C (Oral)   Resp 18   Ht 5' 4 (1.626 m)   Wt 70.3 kg   SpO2 96%   BMI 26.61 kg/m   PROVIDERS: Carla [name] LABOR, MD is PCP    LABS: Labs reviewed: Acceptable for surgery. (all labs ordered are listed, but only abnormal results are displayed)  Labs Reviewed  SURGICAL PCR SCREEN     IMAGES:   EKG:   CV: Echo 06/06/2020 1. Left ventricular ejection fraction, by estimation, is 60 to 65%. The  left ventricle has normal function. The left ventricle has no regional  wall motion abnormalities. Left ventricular diastolic parameters are  consistent with Grade I diastolic  dysfunction (impaired relaxation).   2. Right ventricular systolic function is normal. The right ventricular  size is normal. There is normal pulmonary artery systolic pressure. The  estimated right ventricular systolic pressure is  31.0 mmHg.  Past Medical History:  Diagnosis Date   Allergic rhinitis, cause unspecified    Anemia, unspecified    Arthritis    Cancer (HCC)    Carpal tunnel syndrome    Cystitis, unspecified    Family history of osteoporosis    Hypothyroidism    PONV (postoperative nausea and vomiting)    Postnasal drip    Syncope and collapse     Past Surgical History:  Procedure Laterality Date   BONE MARROW BIOPSY     CARPAL TUNNEL RELEASE     COLONOSCOPY  09/28/2003   COLONOSCOPY WITH PROPOFOL  N/A 07/31/2015   Procedure: COLONOSCOPY WITH PROPOFOL ;  Surgeon: Carla Bunk, MD;  Location: [facility];  Service: Endoscopy;  Laterality: N/A;   DIAGNOSTIC LAPAROSCOPY     dx lack of pregnancy    EYE SURGERY     lasik, cataract, prk,yag procedure    MEDICATIONS:  acetaminophen  (TYLENOL ) 500 MG tablet   amoxicillin -clavulanate (AUGMENTIN ) 875-125 MG tablet   aspirin EC 81 MG tablet   calcium-vitamin D  (OSCAL WITH D) 500-5 MG-MCG tablet   Darbepoetin Alfa  (ARANESP ) 500 MCG/ML SOSY injection   HYDROcodone -acetaminophen  (NORCO/VICODIN) 5-325 MG tablet   levothyroxine  (SYNTHROID ) 25 MCG tablet   loratadine (CLARITIN) 10 MG tablet   luspatercept -aamt (REBLOZYL ) 75 MG subcutaneous injection   ondansetron  (ZOFRAN -ODT) 4 MG disintegrating tablet   oxyCODONE  (OXY IR/ROXICODONE ) 5 MG immediate release tablet   promethazine  (PHENERGAN ) 25 MG tablet   Vitamin D , Ergocalciferol , (DRISDOL ) 1.25 MG (50000 UNIT) CAPS  capsule   No current facility-administered medications for this encounter.     Carla Hoots Ward, [name] [facility] [phone]

## 2024-03-20 ENCOUNTER — Other Ambulatory Visit: Payer: Self-pay | Admitting: Internal Medicine

## 2024-03-20 NOTE — Discharge Instructions (Signed)
 Bonner Hair MD, MPH Aleck Stalling, [name] [facility] [address] [phone] (tel)   [phone] (fax)   POST-OPERATIVE INSTRUCTIONS - TOTAL SHOULDER REPLACEMENT    WOUND CARE You Malloy leave the operative dressing in place until your follow-up appointment. KEEP THE INCISIONS CLEAN AND DRY. There Niu be a small amount of fluid/bleeding leaking at the surgical site. This is normal after surgery.  If it fills with liquid or blood please call us  immediately to change it for you. Use the provided ice machine or Ice packs as often as possible for the first 3-4 days, then as needed for pain relief.   Keep a layer of cloth or a shirt between your skin and the cooling unit to prevent frost bite as it can get very cold.  SHOWERING: - You Balogh shower on Post-Op Day #2.  - The dressing is water resistant but do not scrub it as it Hagenow start to peel up.   - You Abshier remove the sling for showering - Gently pat the area dry.  - Do not soak the shoulder in water.  - Do not go swimming in the pool or ocean until your incision has completely healed (about 4-6 weeks after surgery) - KEEP THE INCISIONS CLEAN AND DRY.  EXERCISES Wear the sling at all times  You Vernet remove the sling for showering, but keep the arm across the chest or in a secondary sling.    Accidental/Purposeful External Rotation and shoulder flexion (reaching behind you) is to be avoided at all costs for the first month. It is ok to come out of your sling if your are sitting and have assistance for eating.   Do not lift anything heavier than 1 pound until we discuss it further in clinic.  It is normal for your fingers/hand to become more swollen after surgery and discolored from bruising.   This will resolve over the first few weeks usually after surgery. Please continue to ambulate and do not stay sitting or lying for too long.  Perform foot and wrist pumps to assist in circulation.  PHYSICAL  THERAPY - no therapy for 4 weeks after surgery  REGIONAL ANESTHESIA (NERVE BLOCKS) The anesthesia team Kuhlmann have performed a nerve block for you this is a great tool used to minimize pain.   The block Vestal start wearing off overnight (between 8-24 hours postop) When the block wears off, your pain Wool go from nearly zero to the pain you would have had postop without the block. This is an abrupt transition but nothing dangerous is happening.   This can be a challenging period but utilize your as needed pain medications to try and manage this period. We suggest you use the pain medication the first night prior to going to bed, to ease this transition.  You Hubbard take an extra dose of narcotic when this happens if needed   POST-OP MEDICATIONS- Multimodal approach to pain control In general your pain will be controlled with a combination of substances.  Prescriptions unless otherwise discussed are electronically sent to your pharmacy.  This is a carefully made plan we use to minimize narcotic use.     Celebrex - Anti-inflammatory medication taken on a scheduled basis Acetaminophen  - Non-narcotic pain medicine taken on a scheduled basis  Oxycodone  - This is a strong narcotic, to be used only on an "as needed" basis for SEVERE pain ONLY Aspirin 81mg  - This medicine is used to minimize the risk of blood  clots after surgery. Omeprazole - daily medicine to protect your stomach while taking anti-inflammatories.   Zofran  -  take as needed for nausea   FOLLOW-UP If you develop a Fever (>101.5), Redness or Drainage from the surgical incision site, please call our office to arrange for an evaluation. Please call the office to schedule a follow-up appointment for a wound check, 7-10 days post-operatively.  IF YOU HAVE ANY QUESTIONS, PLEASE FEEL FREE TO CALL OUR OFFICE.  HELPFUL INFORMATION  Your arm will be in a sling following surgery. You will be in this sling for the next 4 weeks.   You Laviolette be more  comfortable sleeping in a semi-seated position the first few nights following surgery.  Keep a pillow propped under the elbow and forearm for comfort.  If you have a recliner type of chair it might be beneficial.  If not that is fine too, but it would be helpful to sleep propped up with pillows behind your operated shoulder as well under your elbow and forearm.  This will reduce pulling on the suture lines.  When dressing, put your operative arm in the sleeve first.  When getting undressed, take your operative arm out last.  Loose fitting, button-down shirts are recommended.  In most states it is against the law to drive while your arm is in a sling. And certainly against the law to drive while taking narcotics.  You Diprima return to work/school in the next couple of days when you feel up to it. Desk work and typing in the sling is fine.  We suggest you use the pain medication the first night prior to going to bed, in order to ease any pain when the anesthesia wears off. You should avoid taking pain medications on an empty stomach as it will make you nauseous.  You should wean off your narcotic medicines as soon as you are able.     Most patients will be off narcotics before their first postop appointment.   Do not drink alcoholic beverages or take illicit drugs when taking pain medications.  Pain medication Massman make you constipated.  Below are a few solutions to try in this order: Decrease the amount of pain medication if you aren't having pain. Drink lots of decaffeinated fluids. Drink prune juice and/or each dried prunes  If the first 3 don't work start with additional solutions Take Colace - an over-the-counter stool softener Take Senokot - an over-the-counter laxative Take Miralax - a stronger over-the-counter laxative   Dental Antibiotics:  We require dental prophylaxis for 2 years after a shoulder replacement  Contact your surgeon for an antibiotic prescription, prior to your dental  procedure.   For more information including helpful videos and documents visit our website:   [email or web address]

## 2024-03-20 NOTE — Anesthesia Preprocedure Evaluation (Signed)
 Anesthesia Evaluation  Patient identified by MRN, date of birth, ID band Patient awake    Reviewed: Allergy & Precautions, NPO status , Patient's Chart, lab work & pertinent test results  History of Anesthesia Complications (+) PONV and history of anesthetic complications  Airway Mallampati: II  TM Distance: >3 FB Neck ROM: Full    Dental no notable dental hx. (+) Teeth Intact, Dental Advisory Given   Pulmonary    Pulmonary exam normal breath sounds clear to auscultation       Cardiovascular negative cardio ROS Normal cardiovascular exam Rhythm:Regular Rate:Normal     Neuro/Psych  Neuromuscular disease    GI/Hepatic negative GI ROS,,,  Endo/Other  Hypothyroidism    Renal/GU Renal diseaseLab Results      Component                Value               Date                          K                        4.2                 02/23/2024                   CREATININE               1.02 (H)            02/23/2024                GFRNONAA                 58 (L)              02/23/2024                 GLUCOSE                  142 (H)             02/23/2024                      Musculoskeletal  (+) Arthritis ,    Abdominal   Peds  Hematology Lab Results      Component                Value               Date                      WBC                      14.1 (H)            02/23/2024                HGB                      10.0 (L)            02/23/2024                HCT                      32.1 (L)  02/23/2024                MCV                      94.4                02/23/2024                PLT                      253                 02/23/2024              Anesthesia Other Findings All: Codeine  Reproductive/Obstetrics                             Anesthesia Physical Anesthesia Plan  ASA: 2  Anesthesia Plan: General and Regional   Post-op Pain Management: Regional block*  and Minimal or no pain anticipated   Induction: Intravenous  PONV Risk Score and Plan: 4 or greater and Treatment Marte vary due to age or medical condition, Midazolam , Ondansetron , Propofol  infusion and TIVA  Airway Management Planned: Oral ETT  Additional Equipment: None  Intra-op Plan:   Post-operative Plan: Extubation in OR  Informed Consent: I have reviewed the patients History and Physical, chart, labs and discussed the procedure including the risks, benefits and alternatives for the proposed anesthesia with the patient or authorized representative who has indicated his/her understanding and acceptance.     Dental advisory given  Plan Discussed with: CRNA and Surgeon  Anesthesia Plan Comments: (GA + L ISB)        Anesthesia Quick Evaluation

## 2024-03-21 ENCOUNTER — Ambulatory Visit (HOSPITAL_COMMUNITY)
Admission: RE | Admit: 2024-03-21 | Discharge: 2024-03-21 | Disposition: A | Discharge status: Home / Self Care | Attending: Orthopaedic Surgery | Admitting: Orthopaedic Surgery

## 2024-03-21 ENCOUNTER — Encounter (HOSPITAL_COMMUNITY)
Admission: RE | Disposition: A | Discharge status: Home / Self Care | Payer: Self-pay | Source: Home / Self Care | Attending: Orthopaedic Surgery

## 2024-03-21 ENCOUNTER — Other Ambulatory Visit: Payer: Self-pay

## 2024-03-21 ENCOUNTER — Encounter (HOSPITAL_COMMUNITY): Payer: Self-pay | Admitting: Orthopaedic Surgery

## 2024-03-21 ENCOUNTER — Other Ambulatory Visit: Payer: Self-pay | Admitting: Internal Medicine

## 2024-03-21 ENCOUNTER — Ambulatory Visit (HOSPITAL_COMMUNITY)

## 2024-03-21 ENCOUNTER — Ambulatory Visit (HOSPITAL_COMMUNITY): Payer: Self-pay | Admitting: Physician Assistant

## 2024-03-21 ENCOUNTER — Ambulatory Visit (HOSPITAL_BASED_OUTPATIENT_CLINIC_OR_DEPARTMENT_OTHER): Payer: Self-pay | Admitting: Anesthesiology

## 2024-03-21 DIAGNOSIS — W1809XA Striking against other object with subsequent fall, initial encounter: Secondary | ICD-10-CM | POA: Insufficient documentation

## 2024-03-21 DIAGNOSIS — Z96612 Presence of left artificial shoulder joint: Secondary | ICD-10-CM | POA: Diagnosis not present

## 2024-03-21 DIAGNOSIS — S42202A Unspecified fracture of upper end of left humerus, initial encounter for closed fracture: Secondary | ICD-10-CM | POA: Diagnosis not present

## 2024-03-21 DIAGNOSIS — S42292A Other displaced fracture of upper end of left humerus, initial encounter for closed fracture: Secondary | ICD-10-CM

## 2024-03-21 DIAGNOSIS — G8918 Other acute postprocedural pain: Secondary | ICD-10-CM | POA: Diagnosis not present

## 2024-03-21 DIAGNOSIS — E039 Hypothyroidism, unspecified: Secondary | ICD-10-CM | POA: Diagnosis not present

## 2024-03-21 DIAGNOSIS — D469 Myelodysplastic syndrome, unspecified: Secondary | ICD-10-CM | POA: Insufficient documentation

## 2024-03-21 DIAGNOSIS — Z7960 Long term (current) use of unspecified immunomodulators and immunosuppressants: Secondary | ICD-10-CM | POA: Insufficient documentation

## 2024-03-21 DIAGNOSIS — Z471 Aftercare following joint replacement surgery: Secondary | ICD-10-CM | POA: Diagnosis not present

## 2024-03-21 DIAGNOSIS — S42252A Displaced fracture of greater tuberosity of left humerus, initial encounter for closed fracture: Secondary | ICD-10-CM | POA: Diagnosis not present

## 2024-03-21 HISTORY — PX: REVERSE SHOULDER ARTHROPLASTY: SHX5054

## 2024-03-21 HISTORY — DX: Unspecified cataract: H26.9

## 2024-03-21 SURGERY — ARTHROPLASTY, SHOULDER, TOTAL, REVERSE
Anesthesia: Regional | Site: Shoulder | Laterality: Left

## 2024-03-21 MED ORDER — CEFAZOLIN SODIUM-DEXTROSE 2-4 GM/100ML-% IV SOLN
2.0000 g | INTRAVENOUS | Status: AC
  Administered 2024-03-21: 2 g via INTRAVENOUS
  Filled 2024-03-21: qty 100

## 2024-03-21 MED ORDER — DEXAMETHASONE SODIUM PHOSPHATE 10 MG/ML IJ SOLN
INTRAMUSCULAR | Status: DC | PRN
  Administered 2024-03-21: 5 mg via INTRAVENOUS

## 2024-03-21 MED ORDER — PROPOFOL 10 MG/ML IV BOLUS
INTRAVENOUS | Status: DC | PRN
  Administered 2024-03-21: 100 mg via INTRAVENOUS
  Administered 2024-03-21: 100 ug/kg/min via INTRAVENOUS

## 2024-03-21 MED ORDER — ONDANSETRON HCL 4 MG/2ML IJ SOLN
INTRAMUSCULAR | Status: AC
  Filled 2024-03-21: qty 2

## 2024-03-21 MED ORDER — CELECOXIB 100 MG PO CAPS
100.0000 mg | ORAL_CAPSULE | Freq: Two times a day (BID) | ORAL | 0 refills | Status: DC
Start: 2024-03-21 — End: 2024-03-21

## 2024-03-21 MED ORDER — OXYCODONE HCL 5 MG/5ML PO SOLN: 5.0000 mg | Freq: Once | ORAL | Status: DC | PRN

## 2024-03-21 MED ORDER — LACTATED RINGERS IV SOLN
INTRAVENOUS | Status: DC
  Administered 2024-03-21: 1000 mL via INTRAVENOUS

## 2024-03-21 MED ORDER — OXYCODONE HCL 5 MG PO TABS: ORAL_TABLET | ORAL | 0 refills | Status: AC

## 2024-03-21 MED ORDER — SUGAMMADEX SODIUM 200 MG/2ML IV SOLN
INTRAVENOUS | Status: DC | PRN
  Administered 2024-03-21: 200 mg via INTRAVENOUS

## 2024-03-21 MED ORDER — PROMETHAZINE HCL 25 MG PO TABS: 25.0000 mg | ORAL_TABLET | Freq: Four times a day (QID) | ORAL | 1 refills | Status: DC | PRN

## 2024-03-21 MED ORDER — HYDROMORPHONE HCL 1 MG/ML IJ SOLN: 0.2500 mg | INTRAMUSCULAR | Status: DC | PRN

## 2024-03-21 MED ORDER — DEXMEDETOMIDINE HCL IN NACL 80 MCG/20ML IV SOLN
INTRAVENOUS | Status: DC | PRN
  Administered 2024-03-21: 4 ug via INTRAVENOUS

## 2024-03-21 MED ORDER — ROCURONIUM BROMIDE 10 MG/ML (PF) SYRINGE
PREFILLED_SYRINGE | INTRAVENOUS | Status: AC
  Filled 2024-03-21: qty 10

## 2024-03-21 MED ORDER — OMEPRAZOLE 20 MG PO CPDR: 20.0000 mg | DELAYED_RELEASE_CAPSULE | Freq: Every day | ORAL | 0 refills | Status: DC

## 2024-03-21 MED ORDER — LIDOCAINE HCL (PF) 2 % IJ SOLN
INTRAMUSCULAR | Status: AC
Start: 2024-03-21 — End: 2024-03-21
  Filled 2024-03-21: qty 5

## 2024-03-21 MED ORDER — LIDOCAINE HCL (PF) 2 % IJ SOLN
INTRAMUSCULAR | Status: DC | PRN
Start: 2024-03-21 — End: 2024-03-21
  Administered 2024-03-21: 80 mg via INTRADERMAL

## 2024-03-21 MED ORDER — RIVAROXABAN 10 MG PO TABS: 10.0000 mg | ORAL_TABLET | Freq: Every day | ORAL | 0 refills | Status: AC

## 2024-03-21 MED ORDER — ASPIRIN 81 MG PO CHEW: 81.0000 mg | CHEWABLE_TABLET | Freq: Two times a day (BID) | ORAL | 0 refills | Status: DC

## 2024-03-21 MED ORDER — CHLORHEXIDINE GLUCONATE 0.12 % MT SOLN
15.0000 mL | Freq: Once | OROMUCOSAL | Status: AC
  Administered 2024-03-21: 15 mL via OROMUCOSAL

## 2024-03-21 MED ORDER — OXYCODONE HCL 5 MG PO TABS: 5.0000 mg | ORAL_TABLET | Freq: Once | ORAL | Status: DC | PRN

## 2024-03-21 MED ORDER — ORAL CARE MOUTH RINSE: 15.0000 mL | Freq: Once | OROMUCOSAL | Status: AC

## 2024-03-21 MED ORDER — VANCOMYCIN HCL 1 G IV SOLR
INTRAVENOUS | Status: DC | PRN
Start: 2024-03-21 — End: 2024-03-21
  Administered 2024-03-21: 1000 mg via TOPICAL

## 2024-03-21 MED ORDER — PROPOFOL 1000 MG/100ML IV EMUL
INTRAVENOUS | Status: AC
Start: 2024-03-21 — End: 2024-03-21
  Filled 2024-03-21: qty 100

## 2024-03-21 MED ORDER — PHENYLEPHRINE HCL-NACL 20-0.9 MG/250ML-% IV SOLN
INTRAVENOUS | Status: DC | PRN
  Administered 2024-03-21: 35 ug/min via INTRAVENOUS

## 2024-03-21 MED ORDER — AMISULPRIDE (ANTIEMETIC) 5 MG/2ML IV SOLN
10.0000 mg | Freq: Once | INTRAVENOUS | Status: AC | PRN
  Administered 2024-03-21: 10 mg via INTRAVENOUS

## 2024-03-21 MED ORDER — TRANEXAMIC ACID-NACL 1000-0.7 MG/100ML-% IV SOLN
1000.0000 mg | INTRAVENOUS | Status: AC
  Administered 2024-03-21: 1000 mg via INTRAVENOUS
  Filled 2024-03-21: qty 100

## 2024-03-21 MED ORDER — FENTANYL CITRATE (PF) 100 MCG/2ML IJ SOLN
INTRAMUSCULAR | Status: DC | PRN
  Administered 2024-03-21: 25 ug via INTRAVENOUS

## 2024-03-21 MED ORDER — BUPIVACAINE HCL (PF) 0.5 % IJ SOLN
INTRAMUSCULAR | Status: DC | PRN
Start: 2024-03-21 — End: 2024-03-21
  Administered 2024-03-21: 15 mL via PERINEURAL

## 2024-03-21 MED ORDER — FENTANYL CITRATE PF 50 MCG/ML IJ SOSY
50.0000 ug | PREFILLED_SYRINGE | Freq: Once | INTRAMUSCULAR | Status: AC
  Administered 2024-03-21: 50 ug via INTRAVENOUS
  Filled 2024-03-21: qty 1

## 2024-03-21 MED ORDER — AMISULPRIDE (ANTIEMETIC) 5 MG/2ML IV SOLN
INTRAVENOUS | Status: AC
  Filled 2024-03-21: qty 4

## 2024-03-21 MED ORDER — ACETAMINOPHEN 500 MG PO TABS
1000.0000 mg | ORAL_TABLET | Freq: Once | ORAL | Status: DC
  Filled 2024-03-21: qty 2

## 2024-03-21 MED ORDER — ONDANSETRON HCL 4 MG/2ML IJ SOLN: 4.0000 mg | Freq: Once | INTRAMUSCULAR | Status: DC | PRN

## 2024-03-21 MED ORDER — BUPIVACAINE LIPOSOME 1.3 % IJ SUSP
INTRAMUSCULAR | Status: DC | PRN
  Administered 2024-03-21: 10 mL via PERINEURAL

## 2024-03-21 MED ORDER — MIDAZOLAM HCL 2 MG/2ML IJ SOLN
1.0000 mg | Freq: Once | INTRAMUSCULAR | Status: AC
  Administered 2024-03-21: 1 mg via INTRAVENOUS
  Filled 2024-03-21: qty 2

## 2024-03-21 MED ORDER — 0.9 % SODIUM CHLORIDE (POUR BTL) OPTIME
TOPICAL | Status: DC | PRN
  Administered 2024-03-21: 1000 mL

## 2024-03-21 MED ORDER — FENTANYL CITRATE (PF) 100 MCG/2ML IJ SOLN
INTRAMUSCULAR | Status: AC
  Filled 2024-03-21: qty 2

## 2024-03-21 MED ORDER — PHENYLEPHRINE 80 MCG/ML (10ML) SYRINGE FOR IV PUSH (FOR BLOOD PRESSURE SUPPORT)
PREFILLED_SYRINGE | INTRAVENOUS | Status: AC
  Filled 2024-03-21: qty 10

## 2024-03-21 MED ORDER — ACETAMINOPHEN 500 MG PO TABS: 1000.0000 mg | ORAL_TABLET | Freq: Three times a day (TID) | ORAL | 0 refills | Status: AC

## 2024-03-21 MED ORDER — DEXAMETHASONE SODIUM PHOSPHATE 10 MG/ML IJ SOLN
INTRAMUSCULAR | Status: AC
Start: 2024-03-21 — End: 2024-03-21
  Filled 2024-03-21: qty 1

## 2024-03-21 MED ORDER — GABAPENTIN 100 MG PO CAPS
100.0000 mg | ORAL_CAPSULE | Freq: Three times a day (TID) | ORAL | 0 refills | Status: AC
Start: 2024-03-21 — End: 2024-04-04

## 2024-03-21 MED ORDER — ONDANSETRON HCL 4 MG/2ML IJ SOLN
INTRAMUSCULAR | Status: DC | PRN
  Administered 2024-03-21: 4 mg via INTRAVENOUS

## 2024-03-21 MED ORDER — ROCURONIUM BROMIDE 10 MG/ML (PF) SYRINGE
PREFILLED_SYRINGE | INTRAVENOUS | Status: DC | PRN
  Administered 2024-03-21: 50 mg via INTRAVENOUS

## 2024-03-21 MED ORDER — ACETAMINOPHEN 10 MG/ML IV SOLN: 1000.0000 mg | Freq: Once | INTRAVENOUS | Status: DC | PRN

## 2024-03-21 MED ORDER — DEXMEDETOMIDINE HCL IN NACL 80 MCG/20ML IV SOLN
INTRAVENOUS | Status: AC
  Filled 2024-03-21: qty 20

## 2024-03-21 MED ORDER — PROPOFOL 10 MG/ML IV BOLUS
INTRAVENOUS | Status: AC
Start: 2024-03-21 — End: 2024-03-21
  Filled 2024-03-21: qty 20

## 2024-03-21 MED ORDER — SODIUM CHLORIDE 0.9 % IR SOLN
Status: DC | PRN
  Administered 2024-03-21: 1000 mL

## 2024-03-21 MED ORDER — PHENYLEPHRINE 80 MCG/ML (10ML) SYRINGE FOR IV PUSH (FOR BLOOD PRESSURE SUPPORT)
PREFILLED_SYRINGE | INTRAVENOUS | Status: DC | PRN
  Administered 2024-03-21: 160 ug via INTRAVENOUS

## 2024-03-21 SURGICAL SUPPLY — 57 items
BAG COUNTER SPONGE SURGICOUNT (BAG) ×1 IMPLANT
BASEPLATE GLENOID RSA 3X25 0D (Shoulder) IMPLANT
BIT DRILL 3.2 PERIPHERAL SCREW (BIT) IMPLANT
BLADE SAW SGTL 73X25 THK (BLADE) ×1 IMPLANT
CHLORAPREP W/TINT 26 (MISCELLANEOUS) ×2 IMPLANT
CLSR STERI-STRIP ANTIMIC 1/2X4 (GAUZE/BANDAGES/DRESSINGS) ×1 IMPLANT
COOLER ICEMAN CLASSIC (MISCELLANEOUS) ×1 IMPLANT
COVER BACK TABLE 60X90IN (DRAPES) ×1 IMPLANT
COVER SURGICAL LIGHT HANDLE (MISCELLANEOUS) ×1 IMPLANT
DRAPE C-ARM 42X120 X-RAY (DRAPES) IMPLANT
DRAPE INCISE IOBAN 66X45 STRL (DRAPES) ×1 IMPLANT
DRAPE POUCH INSTRU U-SHP 10X18 (DRAPES) ×1 IMPLANT
DRAPE SHEET LG 3/4 BI-LAMINATE (DRAPES) ×2 IMPLANT
DRAPE SURG ORHT 6 SPLT 77X108 (DRAPES) ×2 IMPLANT
DRSG AQUACEL AG ADV 3.5X 6 (GAUZE/BANDAGES/DRESSINGS) ×1 IMPLANT
DRSG XEROFORM 1X8 (GAUZE/BANDAGES/DRESSINGS) IMPLANT
ELECT BLADE TIP CTD 4 INCH (ELECTRODE) ×1 IMPLANT
ELECT PENCIL ROCKER SW 15FT (MISCELLANEOUS) ×1 IMPLANT
ELECT REM PT RETURN 15FT ADLT (MISCELLANEOUS) ×1 IMPLANT
FACESHIELD WRAPAROUND (MASK) ×2 IMPLANT
FACESHIELD WRAPAROUND OR TEAM (MASK) ×3 IMPLANT
GLENOSPHERE REV SHOULDER 36 (Joint) IMPLANT
GLOVE BIO SURGEON STRL SZ 6.5 (GLOVE) ×2 IMPLANT
GLOVE BIOGEL PI IND STRL 6.5 (GLOVE) ×1 IMPLANT
GLOVE BIOGEL PI IND STRL 8 (GLOVE) ×1 IMPLANT
GLOVE ECLIPSE 8.0 STRL XLNG CF (GLOVE) ×2 IMPLANT
GOWN STRL REUS W/ TWL LRG LVL3 (GOWN DISPOSABLE) ×2 IMPLANT
GUIDEWIRE GLENOID 2.5X220 (WIRE) IMPLANT
INSERT REVERSED HUMERAL SIZE 1 (Orthopedic Implant) IMPLANT
KIT BASIN OR (CUSTOM PROCEDURE TRAY) ×1 IMPLANT
KIT STABILIZATION SHOULDER (MISCELLANEOUS) ×1 IMPLANT
KIT TURNOVER KIT A (KITS) ×1 IMPLANT
MANIFOLD NEPTUNE II (INSTRUMENTS) ×1 IMPLANT
NDL MA TROC 1/2 (NEEDLE) IMPLANT
NEEDLE MA TROC 1/2 (NEEDLE) ×1 IMPLANT
NS IRRIG 1000ML POUR BTL (IV SOLUTION) ×1 IMPLANT
PACK SHOULDER (CUSTOM PROCEDURE TRAY) ×1 IMPLANT
PAD COLD SHLDR WRAP-ON (PAD) ×1 IMPLANT
RESTRAINT HEAD UNIVERSAL NS (MISCELLANEOUS) ×1 IMPLANT
SCREW 5.0X18 (Screw) IMPLANT
SCREW BONE THREAD 6.5X35 (Screw) IMPLANT
SCREW PERIPHERAL 30 (Screw) IMPLANT
SET HNDPC FAN SPRY TIP SCT (DISPOSABLE) ×1 IMPLANT
SLING ULTRA III MED (ORTHOPEDIC SUPPLIES) IMPLANT
SPONGE T-LAP 4X18 ~~LOC~~+RFID (SPONGE) IMPLANT
STEM FX PERFORM 8S 130 LT (Stem) IMPLANT
SUCTION TUBE FRAZIER 12FR DISP (SUCTIONS) IMPLANT
SUT ETHIBOND 2 V 37 (SUTURE) ×1 IMPLANT
SUT ETHIBOND NAB CT1 #1 30IN (SUTURE) ×1 IMPLANT
SUT ETHILON 3 0 PS 1 (SUTURE) IMPLANT
SUT MNCRL AB 4-0 PS2 18 (SUTURE) ×1 IMPLANT
SUT VIC AB 0 CT1 36 (SUTURE) IMPLANT
SUT VIC AB 3-0 SH 27X BRD (SUTURE) ×1 IMPLANT
SUTURE FIBERWR #5 38 CONV NDL (SUTURE) ×2 IMPLANT
TOWEL OR 17X26 10 PK STRL BLUE (TOWEL DISPOSABLE) ×1 IMPLANT
TUBE SUCTION HIGH CAP CLEAR NV (SUCTIONS) ×1 IMPLANT
WATER STERILE IRR 1000ML POUR (IV SOLUTION) ×2 IMPLANT

## 2024-03-21 NOTE — Transfer of Care (Signed)
 Immediate Anesthesia Transfer of Care Note  Patient: Carla [name]  Procedure(s) Performed: ARTHROPLASTY, SHOULDER, TOTAL, REVERSE FOR FRACTURE (Left: Shoulder)  Patient Location: PACU  Anesthesia Type:General  Level of Consciousness: drowsy  Airway & Oxygen Therapy: Patient Spontanous Breathing and Patient connected to face mask oxygen  Post-op Assessment: Report given to RN and Post -op Vital signs reviewed and stable  Post vital signs: Reviewed and stable  Last Vitals:  Vitals Value Taken Time  BP 90/74 03/21/24 11:47  Temp    Pulse 73 03/21/24 11:49  Resp 15 03/21/24 11:49  SpO2 99 % 03/21/24 11:49  Vitals shown include unfiled device data.  Last Pain:  Vitals:   03/21/24 1000  TempSrc:   PainSc: 0-No pain         Complications: No notable events documented.

## 2024-03-21 NOTE — Progress Notes (Signed)
 Pt assisted to the restroom with wheelchair and on person assist. Pt ambulated well to toilet and back to w/c. Pt denies pain and states nausea is better. RN and NT assisted pt back to bay and assisted with dressing. Pt appreciative. Pt discharged via wheelchair to main entrance and private vehicle with family.

## 2024-03-21 NOTE — Interval H&P Note (Signed)
 All questions answered, patient wants to proceed with procedure. ? ?

## 2024-03-21 NOTE — Op Note (Signed)
 Orthopaedic Surgery Operative Note (CSN: [id number])  Sholanda Croson Schertzer  Apr 25, 1951 Date of Surgery: 03/21/2024   Diagnoses:  closed displaced fracture of proximal end of left humerus  Procedure: Left reverse total Shoulder Arthroplasty Left open reduction total fixation tuberosities   Operative Finding Successful completion of planned procedure.  Patient had a fracture that was not able to be treated nonoperatively as it had shifted erratically in the first week after injury.  Bone quality was reasonable considering the fracture pattern.  Great repair of the tuberosities.  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  Post-operative plan: The patient will be NWB in sling.  The patient will be will be discharged from PACU if continues to be stable as was plan prior to surgery.  DVT prophylaxis Xarelto 10 mg/day.  Pain control with PRN pain medication preferring oral medicines.  Follow up plan will be scheduled in approximately 7 days for incision check and XR.  Physical therapy to start after 4 weeks.  Implants: Tornier perform humeral size 8S stem from fracture set, 0 polyethylene, 36 standard glenosphere with a 25+3 baseplate and 35 center screw with 2 peripheral locking screws  Post-Op Diagnosis: Same Surgeons:Primary: Cristy Bonner DASEN, MD Assistants:Caroline McBane, [name] Location: [facility] ROOM 09 Anesthesia: General with Exparel Interscalene Antibiotics: Ancef 2g preop, Vancomycin 1000mg  locally Tourniquet time: None Estimated Blood Loss: 100 Complications: None Specimens: None Implants: Implant Name Type Inv. Item Serial No. Manufacturer Lot No. LRB No. Used Action  GLENOSPHERE REV SHOULDER 36 - [id number] Joint GLENOSPHERE REV SHOULDER 36 [id number] [employer]  Left 1 Implanted  BASEPLATE GLENOID RSA 3X25 0D - [id number] Shoulder BASEPLATE GLENOID RSA 3X25 0D [id number] [employer]  Left 1 Implanted  SCREW BONE THREAD  6.5X35 - [id number] Screw SCREW BONE THREAD 6.5X35  [employer]  Left 1 Implanted  SCREW PERIPHERAL 30 - [id number] Screw SCREW PERIPHERAL 30  [employer]  Left 1 Implanted  SCREW 5.0X18 - [id number] Screw SCREW 5.0X18  [employer]  Left 1 Implanted  STEM FX PERFORM 8S 130 LT - [id number] Stem STEM FX PERFORM 8S 130 LT [id number] [employer]  Left 1 Implanted  INSERT REVERSED HUMERAL SIZE 1 - [id number] Orthopedic Implant INSERT REVERSED HUMERAL SIZE 1 [id number] [employer]  Left 1 Implanted    Indications for Surgery:   Daysie Helf Pasquarello is a 73 y.o. female with fall resulting in a fracture that displaced on nonoperative management over the first week.  Benefits and risks of operative and nonoperative management were discussed prior to surgery with patient/guardian(s) and informed consent form was completed.  Infection and need for further surgery were discussed as was prosthetic stability and cuff issues.  We additionally specifically discussed risks of axillary nerve injury, infection, periprosthetic fracture, continued pain and longevity of implants prior to beginning procedure.      Procedure:   The patient was identified in the preoperative holding area where the surgical site was marked. Block placed by anesthesia with exparel.  The patient was taken to the OR where a procedural timeout was called and the above noted anesthesia was induced.  The patient was positioned beachchair on allen table with spider arm positioner.  Preoperative antibiotics were dosed.  The patient's left shoulder was prepped and draped in the usual sterile fashion.  A second preoperative timeout was called.       Standard deltopectoral approach was performed with a #10 blade. We dissected  down to the subcutaneous tissues and the cephalic vein was taken laterally with the deltoid. Clavipectoral fascia was incised in line with the incision. Deep retractors were placed. The long of the biceps tendon was identified and there was  significant tenosynovitis present.  Tenodesis was performed to the pectoralis tendon with #2 Ethibond. The remaining biceps was followed up into the rotator interval where it was released.    We used the bicipital groove as a landmark for the lesser and greater tuberosity fragments.  We were able to mobilize the lesser tuberosity fragment and placed stay sutures in the bone tendon junction to help with mobilization.  This point we were able to identify the  greater tuberosity fragment and 4 #5  FiberWire sutures were used to place into this for eventual repair of the tuberosities.  Once these were both mobilized we took care to identify the shaft fragment as well as the head fragment.  We carefully identified the head fragment were able to manually remove it.  At this point the axillary nerve was found and palpated and with a tug test noted to be intact.  Protected throughout the remainder of the case with blunt retractors.    We then released the SGHL with bovie cautery prior to placing a curved mayo at the junction of the anterior glenoid well above the axillary nerve and bluntly dissecting the subscapularis from the capsule.  We then carefully protected the axillary nerve as we gently released the inferior capsule to fully mobilize the subscapularis.  An anterior deltoid retractor was then placed as well as a small Hohmann retractor superiorly.   The glenoid was relatively preserved as we would expect in this fracture patient.  The remaining labrum was removed circumferentially taking great care not to disrupt the posterior capsule.    The glenoid drill guide was placed and used to drill a guide pin in the center, inferior position. The glenoid face was then reamed concentrically over the guide wire. The center hole was drilled over the guidepin in a near anatomic angle of version. Next the glenoid vault was drilled back and we placed 2 locking screws.  The base plate was screwed into the glenoid vault  obtaining secure fixation. We next placed superior and inferior locking screws for additional fixation.  Next the above size glenosphere was selected and impacted onto the baseplate. The center screw was tightened.   We then repositioned the arm to give access to the humeral shaft fragment.  Drill holes were placed and fiberwire sutures in the shaft for vertical fixation of the tuberosities.  We broached with the Perform humeral fracture stem implants  up to size listed above which obtained an appropriate fit.    We tried multiple options for sizes settling on the above.  The shoulder was trialed.  There was good ROM in all planes and the shoulder was stable with no inferior translation.   We then mobilized her tuberosities again and placed the anterior deep limbs of the 4 #5 fiber wires around the stem.  1 of these was tied down fixing the greater tuberosity in place after bone graft harvest from the humeral head component was placed underneath.   The joint was reduced and thoroughly irrigated with pulsatile lavage. The remaining sutures were then placed through the subscapularis and the bone tendon junction and the tuberosities were reduced after bone graft placed beneath as autograft at the subscap.  We horizontally secured the tuberosities before placing vertical fixation with the  suture that was placed into the shaft.  Tuberosities moved as a unit were happy with the overall reduction.    We irrigated copiously at this point.  Hemostasis was obtained. The deltopectoral interval was reapproximated with #1 Ethibond. The subcutaneous tissues were closed with 3-0 Vicryl and the skin was closed with running nylon.     Aleck Stalling, [name], present and scrubbed throughout the case, critical for completion in a timely fashion, and for retraction, instrumentation, closure.

## 2024-03-21 NOTE — Anesthesia Procedure Notes (Signed)
 Anesthesia Regional Block: Interscalene brachial plexus block   Pre-Anesthetic Checklist: , timeout performed,  Correct Patient, Correct Site, Correct Laterality,  Correct Procedure, Correct Position, site marked,  Risks and benefits discussed,  Surgical consent,  Pre-op evaluation,  At surgeon's request and post-op pain management  Laterality: Upper and Left  Prep: Maximum Sterile Barrier Precautions used, chloraprep       Needles:  Injection technique: Single-shot  Needle Type: Echogenic Needle     Needle Length: 5cm  Needle Gauge: 21     Additional Needles:   Procedures:,,,, ultrasound used (permanent image in chart),,    Narrative:  Start time: 03/21/2024 9:48 AM End time: 03/21/2024 9:54 AM Injection made incrementally with aspirations every 5 mL.  Performed by: Personally  Anesthesiologist: Jefm Garnette LABOR, MD  Additional Notes: Block assessed prior to procedure. Patient tolerated procedure well.

## 2024-03-21 NOTE — Anesthesia Procedure Notes (Signed)
 Procedure Name: Intubation Date/Time: 03/21/2024 10:21 AM  Performed by: Zulema Leita PARAS, CRNAPre-anesthesia Checklist: Patient identified, Emergency Drugs available, Suction available and Patient being monitored Patient Re-evaluated:Patient Re-evaluated prior to induction Oxygen Delivery Method: Circle system utilized Preoxygenation: Pre-oxygenation with 100% oxygen Induction Type: IV induction Ventilation: Mask ventilation without difficulty Laryngoscope Size: Mac and 4 Grade View: Grade I Tube type: Oral Tube size: 7.0 mm Number of attempts: 1 Airway Equipment and Method: Stylet Placement Confirmation: ETT inserted through vocal cords under direct vision, positive ETCO2 and breath sounds checked- equal and bilateral Secured at: 21 cm Tube secured with: Tape Dental Injury: Teeth and Oropharynx as per pre-operative assessment

## 2024-03-21 NOTE — Anesthesia Postprocedure Evaluation (Signed)
 Anesthesia Post Note  Patient: Carla [name]  Procedure(s) Performed: ARTHROPLASTY, SHOULDER, TOTAL, REVERSE FOR FRACTURE (Left: Shoulder)     Patient location during evaluation: PACU Anesthesia Type: Regional and General Level of consciousness: awake and alert Pain management: pain level controlled Vital Signs Assessment: post-procedure vital signs reviewed and stable Respiratory status: spontaneous breathing, nonlabored ventilation, respiratory function stable and patient connected to nasal cannula oxygen Cardiovascular status: blood pressure returned to baseline and stable Postop Assessment: no apparent nausea or vomiting Anesthetic complications: no  No notable events documented.  Last Vitals:  Vitals:   03/21/24 1245 03/21/24 1330  BP: 109/61 95/68  Pulse: 66 71  Resp: (!) 2   Temp:    SpO2: 93% 94%    Last Pain:  Vitals:   03/21/24 1245  TempSrc:   PainSc: Asleep                 Garnette DELENA Gab

## 2024-03-22 ENCOUNTER — Inpatient Hospital Stay

## 2024-03-22 ENCOUNTER — Inpatient Hospital Stay: Attending: Internal Medicine

## 2024-03-22 ENCOUNTER — Inpatient Hospital Stay: Admitting: Nurse Practitioner

## 2024-03-22 ENCOUNTER — Telehealth: Payer: Self-pay | Admitting: *Deleted

## 2024-03-22 ENCOUNTER — Encounter (HOSPITAL_COMMUNITY): Payer: Self-pay | Admitting: Orthopaedic Surgery

## 2024-03-22 NOTE — Telephone Encounter (Signed)
 Pt states she had to cancel appointment with Dr Corky this morning.  Per Epic patient was scheduled to see Tinnie Dawn NP this morning.  Pt request call to reschedule appointment.

## 2024-03-27 DIAGNOSIS — S42252D Displaced fracture of greater tuberosity of left humerus, subsequent encounter for fracture with routine healing: Secondary | ICD-10-CM | POA: Diagnosis not present

## 2024-03-28 ENCOUNTER — Inpatient Hospital Stay: Attending: Internal Medicine

## 2024-03-28 ENCOUNTER — Encounter: Payer: Self-pay | Admitting: Internal Medicine

## 2024-03-28 ENCOUNTER — Inpatient Hospital Stay (HOSPITAL_BASED_OUTPATIENT_CLINIC_OR_DEPARTMENT_OTHER): Admitting: Internal Medicine

## 2024-03-28 ENCOUNTER — Inpatient Hospital Stay

## 2024-03-28 VITALS — BP 111/52 | HR 81 | Temp 97.9°F | Resp 12 | Ht 64.0 in | Wt 163.9 lb

## 2024-03-28 DIAGNOSIS — D46Z Other myelodysplastic syndromes: Secondary | ICD-10-CM

## 2024-03-28 DIAGNOSIS — D461 Refractory anemia with ring sideroblasts: Secondary | ICD-10-CM | POA: Insufficient documentation

## 2024-03-28 LAB — CBC WITH DIFFERENTIAL (CANCER CENTER ONLY)
Abs Immature Granulocytes: 4.93 10*3/uL — ABNORMAL HIGH (ref 0.00–0.07)
Basophils Absolute: 0.4 10*3/uL — ABNORMAL HIGH (ref 0.0–0.1)
Basophils Relative: 1 %
Eosinophils Absolute: 0.2 10*3/uL (ref 0.0–0.5)
Eosinophils Relative: 1 %
HCT: 28 % — ABNORMAL LOW (ref 36.0–46.0)
Hemoglobin: 8.7 g/dL — ABNORMAL LOW (ref 12.0–15.0)
Immature Granulocytes: 18 %
Lymphocytes Relative: 7 %
Lymphs Abs: 1.9 10*3/uL (ref 0.7–4.0)
MCH: 30 pg (ref 26.0–34.0)
MCHC: 31.1 g/dL (ref 30.0–36.0)
MCV: 96.6 fL (ref 80.0–100.0)
Monocytes Absolute: 2.5 10*3/uL — ABNORMAL HIGH (ref 0.1–1.0)
Monocytes Relative: 9 %
Neutro Abs: 17.2 10*3/uL — ABNORMAL HIGH (ref 1.7–7.7)
Neutrophils Relative %: 64 %
Platelet Count: 383 10*3/uL (ref 150–400)
RBC: 2.9 MIL/uL — ABNORMAL LOW (ref 3.87–5.11)
RDW: 30 % — ABNORMAL HIGH (ref 11.5–15.5)
Smear Review: ADEQUATE
WBC Count: 27.1 10*3/uL — ABNORMAL HIGH (ref 4.0–10.5)
nRBC: 0.5 % — ABNORMAL HIGH (ref 0.0–0.2)

## 2024-03-28 LAB — CMP (CANCER CENTER ONLY)
ALT: 20 U/L (ref 0–44)
AST: 24 U/L (ref 15–41)
Albumin: 3.7 g/dL (ref 3.5–5.0)
Alkaline Phosphatase: 53 U/L (ref 38–126)
Anion gap: 6 (ref 5–15)
BUN: 18 mg/dL (ref 8–23)
CO2: 21 mmol/L — ABNORMAL LOW (ref 22–32)
Calcium: 8.5 mg/dL — ABNORMAL LOW (ref 8.9–10.3)
Chloride: 110 mmol/L (ref 98–111)
Creatinine: 0.84 mg/dL (ref 0.44–1.00)
GFR, Estimated: >60 mL/min (ref >60)
Glucose, Bld: 161 mg/dL — ABNORMAL HIGH (ref 70–99)
Potassium: 4.2 mmol/L (ref 3.5–5.1)
Sodium: 137 mmol/L (ref 135–145)
Total Bilirubin: 0.7 mg/dL (ref 0.0–1.2)
Total Protein: 7.9 g/dL (ref 6.5–8.1)

## 2024-03-28 LAB — LACTATE DEHYDROGENASE: LDH: 201 U/L — ABNORMAL HIGH (ref 98–192)

## 2024-03-28 MED ORDER — HEPARIN SOD (PORK) LOCK FLUSH 100 UNIT/ML IV SOLN
500.0000 [IU] | Freq: Once | INTRAVENOUS | Status: AC
  Filled 2024-03-28: qty 5

## 2024-03-28 MED ORDER — DARBEPOETIN ALFA 500 MCG/ML IJ SOSY: 500.0000 ug | PREFILLED_SYRINGE | Freq: Once | INTRAMUSCULAR | Status: DC

## 2024-03-28 MED ORDER — LUSPATERCEPT-AAMT 75 MG ~~LOC~~ SOLR
75.0000 mg | Freq: Once | SUBCUTANEOUS | Status: AC
  Administered 2024-03-28: 75 mg via SUBCUTANEOUS
  Filled 2024-03-28: qty 1.5

## 2024-03-28 MED ORDER — SODIUM CHLORIDE 0.9% FLUSH
10.0000 mL | INTRAVENOUS | Status: AC | PRN
  Filled 2024-03-28: qty 10

## 2024-03-28 NOTE — Assessment & Plan Note (Addendum)
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 MARCH 2023-bone marrow biopsy no evidence of obvious progression of MDS or any acute process.  Currently on  Luspatercept .   # proceed with Luspatercept  # 30 today.  Continueh q 4 weeks given the stability/Hemoglobin- is reasonable; HOLD Aranesp  for now.    # Palpitations: [Dr.Gollan /sp work up 2023- Neg]-intermittent- consider OTC devices for monitoring-  stable   # Leukocytosis-predominant neutrophilia immature cells noted- peripheral blood flow cytometry APRIL 2025- No significant immunophenotypic abnormality detected Neutrophilia with left-shifted maturation and monocytosi ; WBC 27- sec to stress from surgery/no infection- stable.   # vit D def- FEB 2024-Vitamin D - 25/low-  on Vit D 50-continue vitamin D  weekly. OCT 2024-  Vit D-34-recommend every other week. 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  ? Etiology- poor tolerance to cymablta. S/p evaluation with Dr.Vaslow.  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 Consider/recommend COVID/ RSV- stable # handicapped parking.   PS # DISPOSITION:  # today Luspatecept  # follow up in 4 weeks--MD-  labs CBC/cmp;  LDH; Luspatercept  SQ-  Dr.B

## 2024-03-28 NOTE — Progress Notes (Signed)
 Pt fell 03/05/24, tripped over vacuum chord. Lt shoulder replacement surgery [facility] 03/21/24 due to fall. She still feels foggy from anaesthesia.  She did hit her head and mouth as well.

## 2024-03-28 NOTE — Progress Notes (Signed)
 [facility] CONSULT NOTE  Patient Care Team: [name], [name], MD as PCP - General Rennie, Cindy SAUNDERS, MD as Consulting Physician (Hematology and Oncology)  CHIEF COMPLAINTS/PURPOSE OF CONSULTATION: MDS   Oncology History Overview Note   # July 2020-myelodysplastic syndrome with ringed sideroblasts-Normal karyotype; no blasts [IPSS R-Very low risk ~median survival 8.3 years]; iron studies B12 folic acid  myeloma panel normal; pyridoxine levels/copper /zinc -WNL. Erythropoietin  levels-60. II OPINION at [facility] [Dr.DeCastro]  # [facility]/ NGS: TET2([id number])c.2524delT(p.Ser842GlnfsTer31) Exon 3 frame-shift SF3B1([id number])c.2098A>G(p.Lys700Glu) Exon 15 missense  DNMT3A([id number])c.2204A>G(p.Tyr735Cys) Exon 19 missense   # JAN 11th 2020- Aranesp /retacrit;  # July 30th, 2021- START REVLIMID  10 mg/day  [SF3B32mutation]  MARCH 2023- BONE MARROW, ASPIRATE, CLOT, CORE:  -  Hypercellular marrow involved by myelodysplastic syndrome with ring  sideroblasts (MDS-RS)  -  Polytypic plasmacytosis   # [facility] 2023-increase the frequency of Aranesp  400 mcg every 2 weeks; continue Revlimid .   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  Ambulating independently.  Carla [name] 73 y.o.  female history of low-grade MDS anemia with ring sideroblasts currently on  Luspatercept  is here for follow-up.    In the interim patient had surgery- proximal humerus arthroplasty on June 25th.  Complains of pain from surgery otherwise feeling well.  Pt fell 01/05/24, having back pain, soreness, hit her head. She fell backwards on her hardwood floor, she was putting on her bedroom shoes and lost balance. Pain 2/10.   Xrays of her back with ortho, [facility]. Was given hydrocodone , she stopped due to nausea.   Patient continues to have occasional SOB; otherwise no worsening cough or leg swelling. Appetite is fairly good. Still taking Calcium+D with the prescription Vitamin D .   Review of Systems  Constitutional:  Positive for malaise/fatigue. Negative for chills, diaphoresis and fever.  HENT:  Negative for nosebleeds and sore throat.   Eyes:  Negative for double vision.  Respiratory:  Negative for cough, hemoptysis, sputum production, shortness of breath and wheezing.   Cardiovascular:  Negative for chest pain and orthopnea.  Gastrointestinal:  Negative for abdominal pain, blood in stool, constipation, diarrhea, heartburn, melena and vomiting.  Genitourinary:  Negative for dysuria, frequency and urgency.  Musculoskeletal:  Positive for back pain and joint pain.  Skin: Negative.  Negative for itching and rash.  Neurological:  Negative for dizziness, tingling, focal weakness, weakness and headaches.  Endo/Heme/Allergies:  Does not bruise/bleed easily.  Psychiatric/Behavioral:  Negative for depression. The patient is not nervous/anxious and does not have insomnia.     MEDICAL HISTORY:  Past Medical History:  Diagnosis Date   Allergic rhinitis, cause unspecified    Anemia, unspecified    Arthritis    Cancer (HCC)    Carpal tunnel syndrome    Cataract 2007/2010   Cystitis, unspecified    Family history of osteoporosis    Hypothyroidism  PONV (postoperative nausea and vomiting)    Postnasal drip    Syncope and collapse     SURGICAL HISTORY: Past  Surgical History:  Procedure Laterality Date   BONE MARROW BIOPSY     CARPAL TUNNEL RELEASE     COLONOSCOPY  09/28/2003   COLONOSCOPY WITH PROPOFOL  N/A 07/31/2015   Procedure: COLONOSCOPY WITH PROPOFOL ;  Surgeon: Lamar Bunk, MD;  Location: [facility];  Service: Endoscopy;  Laterality: N/A;   DIAGNOSTIC LAPAROSCOPY     dx lack of pregnancy    EYE SURGERY     lasik, cataract, prk,yag procedure   REVERSE SHOULDER ARTHROPLASTY Left 03/21/2024   Procedure: ARTHROPLASTY, SHOULDER, TOTAL, REVERSE FOR FRACTURE;  Surgeon: Cristy Bonner DASEN, MD;  Location: [facility];  Service: Orthopedics;  Laterality: Left;    SOCIAL HISTORY: Social History   Socioeconomic History   Marital status: Married    Spouse name: Not on file   Number of children: Not on file   Years of education: Not on file   Highest education level: Not on file  Occupational History   Not on file  Tobacco Use   Smoking status: Never   Smokeless tobacco: Never  Vaping Use   Vaping status: Never Used  Substance and Sexual Activity   Alcohol use: Not Currently    Comment: Rare   Drug use: No   Sexual activity: Not Currently  Other Topics Concern   Not on file  Social History Narrative   No regular exercise      Drinks lots of [employer]         Social Drivers of Health   Financial Resource Strain: Low Risk  (05/23/2023)   Overall Financial Resource Strain (CARDIA)    Difficulty of Paying Living Expenses: Not hard at all  Food Insecurity: No Food Insecurity (05/23/2023)   Hunger Vital Sign    Worried About Running Out of Food in the Last Year: Never true    Ran Out of Food in the Last Year: Never true  Transportation Needs: No Transportation Needs (05/23/2023)   PRAPARE - Administrator, Civil Service (Medical): No    Lack of Transportation (Non-Medical): No  Physical Activity: Inactive (05/23/2023)   Exercise Vital Sign    Days of Exercise per Week: 0 days    Minutes of Exercise per Session: 0 min  Stress:  No Stress Concern Present (05/23/2023)   [employer] of Occupational Health - Occupational Stress Questionnaire    Feeling of Stress : Not at all  Social Connections: Moderately Integrated (05/23/2023)   Social Connection and Isolation Panel    Frequency of Communication with Friends and Family: More than three times a week    Frequency of Social Gatherings with Friends and Family: More than three times a week    Attends Religious Services: More than 4 times per year    Active Member of [employer] or Organizations: No    Attends Banker Meetings: Never    Marital Status: Married  Catering manager Violence: Not At Risk (05/23/2023)   Humiliation, Afraid, Rape, and Kick questionnaire    Fear of Current or Ex-Partner: No    Emotionally Abused: No    Physically Abused: No    Sexually Abused: No    FAMILY HISTORY: Family History  Problem Relation Age of Onset   Osteoporosis Mother    Stroke Mother    Arthritis Mother    Coronary artery disease Father    Stroke Father 59  Arthritis Father    Heart disease Father    Diabetes Other        Aunts and uncles   Breast cancer Paternal Aunt    Breast cancer Maternal Aunt    Arthritis/Rheumatoid Child    Breast cancer Cousin    Cancer Maternal Aunt    Cancer Maternal Aunt    Cancer Paternal Aunt    Diabetes Paternal Aunt    Diabetes Sister    Diabetes Paternal Uncle    Heart disease Paternal Uncle    Stroke Paternal Uncle    Diabetes Paternal Aunt    Stroke Paternal Aunt     ALLERGIES:  is allergic to codeine.  MEDICATIONS:  Current Outpatient Medications  Medication Sig Dispense Refill   acetaminophen  (TYLENOL ) 500 MG tablet Take 2 tablets (1,000 mg total) by mouth every 8 (eight) hours for 14 days. 84 tablet 0   calcium-vitamin D  (OSCAL WITH D) 500-5 MG-MCG tablet Take 1 tablet by mouth daily with breakfast.     celecoxib  (CELEBREX ) 100 MG capsule Take 100 mg by mouth 2 (two) times daily.     Darbepoetin Alfa   (ARANESP ) 500 MCG/ML SOSY injection Inject 500 mcg into the skin See admin instructions. Inject 500 mcg into the skin every week     gabapentin  (NEURONTIN ) 100 MG capsule Take 1 capsule (100 mg total) by mouth 3 (three) times daily for 14 days. For pain. 42 capsule 0   levothyroxine  (SYNTHROID ) 25 MCG tablet TAKE ONE TAB BY MOUTH ONCE DAILY. TAKE ON AN EMPTY STOMACH WITH A GLASS OF WATER ATLEAST 30-60 MINUTES BEFORE BREAKFAST 90 tablet 3   loratadine (CLARITIN) 10 MG tablet Take 10 mg by mouth at bedtime.     luspatercept -aamt (REBLOZYL ) 75 MG subcutaneous injection Inject into the skin.     rivaroxaban  (XARELTO ) 10 MG TABS tablet Take 1 tablet (10 mg total) by mouth daily. For DVT prophylaxis after surgery 30 tablet 0   Vitamin D , Ergocalciferol , (DRISDOL ) 1.25 MG (50000 UNIT) CAPS capsule Take 1 capsule (50,000 Units total) by mouth once a week. 12 capsule 1   amoxicillin -clavulanate (AUGMENTIN ) 875-125 MG tablet Take 1 tablet by mouth every 12 (twelve) hours. (Patient not taking: Reported on 03/28/2024) 14 tablet 0   promethazine  (PHENERGAN ) 25 MG tablet Take 1 tablet (25 mg total) by mouth every 6 (six) hours as needed for nausea or vomiting. (Patient not taking: Reported on 03/28/2024) 10 tablet 1   No current facility-administered medications for this visit.   Facility-Administered Medications Ordered in Other Visits  Medication Dose Route Frequency Provider Last Rate Last Admin   heparin  lock flush 100 unit/mL  500 Units Intravenous Once Vollie Aaron R, MD       sodium chloride  flush (NS) 0.9 % injection 10 mL  10 mL Intravenous PRN Amario Longmore R, MD          PHYSICAL EXAMINATION:   Vitals:   03/28/24 0850  BP: (!) 111/52  Pulse: 81  Resp: 12  Temp: 97.9 F (36.6 C)  SpO2: 97%        Filed Weights   03/28/24 0850  Weight: 163 lb 14.4 oz (74.3 kg)        Physical Exam HENT:     Head: Normocephalic and atraumatic.     Mouth/Throat:     Pharynx: No  oropharyngeal exudate.  Eyes:     Pupils: Pupils are equal, round, and reactive to light.  Cardiovascular:     Rate and Rhythm:  Normal rate and regular rhythm.     Pulses: Normal pulses.     Heart sounds: Normal heart sounds.  Pulmonary:     Effort: Pulmonary effort is normal. No respiratory distress.     Breath sounds: Normal breath sounds. No wheezing.  Abdominal:     General: Bowel sounds are normal. There is no distension.     Palpations: Abdomen is soft. There is no mass.     Tenderness: There is no abdominal tenderness. There is no guarding or rebound.  Musculoskeletal:        General: No tenderness. Normal range of motion.     Cervical back: Normal range of motion and neck supple.  Skin:    General: Skin is warm.  Neurological:     Mental Status: She is alert and oriented to person, place, and time.  Psychiatric:        Mood and Affect: Affect normal.     LABORATORY DATA:  I have reviewed the data as listed Lab Results  Component Value Date   WBC 27.1 (H) 03/28/2024   HGB 8.7 (L) 03/28/2024   HCT 28.0 (L) 03/28/2024   MCV 96.6 03/28/2024   PLT 383 03/28/2024   Recent Labs    01/25/24 1002 02/23/24 1020 03/28/24 0903  NA 138 137 137  K 4.1 4.2 4.2  CL 104 104 110  CO2 24 22 21*  GLUCOSE 135* 142* 161*  BUN 21 19 18   CREATININE 1.01* 1.02* 0.84  CALCIUM 9.4 9.2 8.5*  GFRNONAA 59* 58* >60  PROT 8.0 8.1 7.9  ALBUMIN 4.2 4.3 3.7  AST 20 23 24   ALT 16 15 20   ALKPHOS 62 42 53  BILITOT 0.6 0.8 0.7     DG Shoulder Left Port Result Date: 03/21/2024 CLINICAL DATA:  Postop. EXAM: LEFT SHOULDER COMPARISON:  Preoperative imaging FINDINGS: Reverse left shoulder arthroplasty in expected alignment. Fragments about the greater tuberosity were present on preoperative exam. Recent postsurgical change includes air and edema in the joint space and soft tissues. Overlying skin staples in place. IMPRESSION: Reverse left shoulder arthroplasty in expected alignment.  Electronically Signed   By: Andrea Gasman M.D.   On: 03/21/2024 12:30   CT Shoulder Left Wo Contrast Result Date: 03/06/2024 CLINICAL DATA:  Fall, shoulder trauma, pain. EXAM: CT OF THE UPPER LEFT EXTREMITY WITHOUT CONTRAST TECHNIQUE: Multidetector CT imaging of the upper left extremity was performed according to the standard protocol. RADIATION DOSE REDUCTION: This exam was performed according to the departmental dose-optimization program which includes automated exposure control, adjustment of the mA and/or kV according to patient size and/or use of iterative reconstruction technique. COMPARISON:  Plain films today FINDINGS: Bones/Joint/Cartilage There is a mildly impacted left femoral neck fracture. Fracture extends through the superolateral humeral head in the region of the greater tuberosity. No subluxation or dislocation. Ligaments Suboptimally assessed by CT. Muscles and Tendons Negative Soft tissues Negative IMPRESSION: Impacted left humeral neck fracture involving the greater tuberosity. Electronically Signed   By: Franky Crease M.D.   On: 03/06/2024 00:22   CT Knee Left Wo Contrast Result Date: 03/06/2024 CLINICAL DATA:  Recent trip and fall with knee pain, negative x-ray EXAM: CT OF THE LEFT KNEE WITHOUT CONTRAST TECHNIQUE: Multidetector CT imaging of the left knee was performed according to the standard protocol. Multiplanar CT image reconstructions were also generated. RADIATION DOSE REDUCTION: This exam was performed according to the departmental dose-optimization program which includes automated exposure control, adjustment of the mA and/or kV according  to patient size and/or use of iterative reconstruction technique. COMPARISON:  Knee x-ray from earlier in the same day. FINDINGS: Bones/Joint/Cartilage No acute fracture or dislocation is noted. No joint effusion is seen. Ligaments Suboptimally assessed by CT. Muscles and Tendons Surrounding musculature is within normal limits. Soft tissues  Soft tissues are unremarkable. IMPRESSION: No acute abnormality in the left knee. Electronically Signed   By: Oneil Devonshire M.D.   On: 03/06/2024 00:19   DG Knee Complete 4 Views Left Addendum Date: 03/06/2024 ADDENDUM REPORT: 03/06/2024 00:17 ADDENDUM: The impression should read: No acute abnormality noted. Electronically Signed   By: Oneil Devonshire M.D.   On: 03/06/2024 00:17   Result Date: 03/06/2024 CLINICAL DATA:  Recent trip and fall with left knee pain, initial encounter EXAM: LEFT KNEE - COMPLETE 4+ VIEW COMPARISON:  None Available. FINDINGS: No evidence of fracture, dislocation, or joint effusion. No evidence of arthropathy or other focal bone abnormality. Soft tissues are unremarkable. IMPRESSION: Acute abnormality noted. Electronically Signed: By: Oneil Devonshire M.D. On: 03/05/2024 22:01   DG Humerus Left Result Date: 03/05/2024 CLINICAL DATA:  Trip and fall with left arm pain, initial encounter EXAM: LEFT HUMERUS - 2+ VIEW COMPARISON:  None Available. FINDINGS: There is a fracture through the surgical neck with impaction at the fracture site. Degenerative changes of the acromioclavicular joint are seen. IMPRESSION: Mildly impacted fracture of the proximal humerus. Electronically Signed   By: Oneil Devonshire M.D.   On: 03/05/2024 22:00   CT Head Wo Contrast Result Date: 03/05/2024 CLINICAL DATA:  Tripped over a car in the garage. Face it seem at floor. Pain in nose, teeth, left upper arm and elbow. EXAM: CT HEAD WITHOUT CONTRAST CT MAXILLOFACIAL WITHOUT CONTRAST CT CERVICAL SPINE WITHOUT CONTRAST TECHNIQUE: Multidetector CT imaging of the head, cervical spine, and maxillofacial structures were performed using the standard protocol without intravenous contrast. Multiplanar CT image reconstructions of the cervical spine and maxillofacial structures were also generated. RADIATION DOSE REDUCTION: This exam was performed according to the departmental dose-optimization program which includes automated exposure  control, adjustment of the mA and/or kV according to patient size and/or use of iterative reconstruction technique. COMPARISON:  None Available. FINDINGS: CT HEAD FINDINGS Brain: No evidence of acute infarction, hemorrhage, hydrocephalus, extra-axial collection or mass lesion/mass effect. Vascular: No hyperdense vessel or unexpected calcification. Skull: Normal. Negative for fracture or focal lesion. Other: None. CT MAXILLOFACIAL FINDINGS Osseous: No fracture or mandibular dislocation. No destructive process. Orbits: Negative. No traumatic or inflammatory finding. Sinuses: Clear. Soft tissues: Negative. CT CERVICAL SPINE FINDINGS Alignment: No evidence of traumatic listhesis. Skull base and vertebrae: No acute fracture. Soft tissues and spinal canal: No prevertebral fluid or swelling. No visible canal hematoma. Disc levels: Multilevel spondylosis, disc space height loss, and degenerative endplate change greatest at C5-C6 where it is moderate. Posterior disc osteophyte complexes at C4-C5 and C5-C6 cause mild to moderate effacement of the ventral thecal sac. No severe spinal canal narrowing. Upper chest: No acute abnormality. Other: None. IMPRESSION: 1. No acute intracranial abnormality. 2. No acute facial bone fracture. 3. No acute fracture in the cervical spine. Electronically Signed   By: Norman Gatlin M.D.   On: 03/05/2024 21:59   CT Cervical Spine Wo Contrast Result Date: 03/05/2024 CLINICAL DATA:  Tripped over a car in the garage. Face it seem at floor. Pain in nose, teeth, left upper arm and elbow. EXAM: CT HEAD WITHOUT CONTRAST CT MAXILLOFACIAL WITHOUT CONTRAST CT CERVICAL SPINE WITHOUT CONTRAST TECHNIQUE: Multidetector CT imaging  of the head, cervical spine, and maxillofacial structures were performed using the standard protocol without intravenous contrast. Multiplanar CT image reconstructions of the cervical spine and maxillofacial structures were also generated. RADIATION DOSE REDUCTION: This exam was  performed according to the departmental dose-optimization program which includes automated exposure control, adjustment of the mA and/or kV according to patient size and/or use of iterative reconstruction technique. COMPARISON:  None Available. FINDINGS: CT HEAD FINDINGS Brain: No evidence of acute infarction, hemorrhage, hydrocephalus, extra-axial collection or mass lesion/mass effect. Vascular: No hyperdense vessel or unexpected calcification. Skull: Normal. Negative for fracture or focal lesion. Other: None. CT MAXILLOFACIAL FINDINGS Osseous: No fracture or mandibular dislocation. No destructive process. Orbits: Negative. No traumatic or inflammatory finding. Sinuses: Clear. Soft tissues: Negative. CT CERVICAL SPINE FINDINGS Alignment: No evidence of traumatic listhesis. Skull base and vertebrae: No acute fracture. Soft tissues and spinal canal: No prevertebral fluid or swelling. No visible canal hematoma. Disc levels: Multilevel spondylosis, disc space height loss, and degenerative endplate change greatest at C5-C6 where it is moderate. Posterior disc osteophyte complexes at C4-C5 and C5-C6 cause mild to moderate effacement of the ventral thecal sac. No severe spinal canal narrowing. Upper chest: No acute abnormality. Other: None. IMPRESSION: 1. No acute intracranial abnormality. 2. No acute facial bone fracture. 3. No acute fracture in the cervical spine. Electronically Signed   By: Norman Gatlin M.D.   On: 03/05/2024 21:59   CT Maxillofacial WO CM Result Date: 03/05/2024 CLINICAL DATA:  Tripped over a car in the garage. Face it seem at floor. Pain in nose, teeth, left upper arm and elbow. EXAM: CT HEAD WITHOUT CONTRAST CT MAXILLOFACIAL WITHOUT CONTRAST CT CERVICAL SPINE WITHOUT CONTRAST TECHNIQUE: Multidetector CT imaging of the head, cervical spine, and maxillofacial structures were performed using the standard protocol without intravenous contrast. Multiplanar CT image reconstructions of the cervical  spine and maxillofacial structures were also generated. RADIATION DOSE REDUCTION: This exam was performed according to the departmental dose-optimization program which includes automated exposure control, adjustment of the mA and/or kV according to patient size and/or use of iterative reconstruction technique. COMPARISON:  None Available. FINDINGS: CT HEAD FINDINGS Brain: No evidence of acute infarction, hemorrhage, hydrocephalus, extra-axial collection or mass lesion/mass effect. Vascular: No hyperdense vessel or unexpected calcification. Skull: Normal. Negative for fracture or focal lesion. Other: None. CT MAXILLOFACIAL FINDINGS Osseous: No fracture or mandibular dislocation. No destructive process. Orbits: Negative. No traumatic or inflammatory finding. Sinuses: Clear. Soft tissues: Negative. CT CERVICAL SPINE FINDINGS Alignment: No evidence of traumatic listhesis. Skull base and vertebrae: No acute fracture. Soft tissues and spinal canal: No prevertebral fluid or swelling. No visible canal hematoma. Disc levels: Multilevel spondylosis, disc space height loss, and degenerative endplate change greatest at C5-C6 where it is moderate. Posterior disc osteophyte complexes at C4-C5 and C5-C6 cause mild to moderate effacement of the ventral thecal sac. No severe spinal canal narrowing. Upper chest: No acute abnormality. Other: None. IMPRESSION: 1. No acute intracranial abnormality. 2. No acute facial bone fracture. 3. No acute fracture in the cervical spine. Electronically Signed   By: Norman Gatlin M.D.   On: 03/05/2024 21:59   DG Shoulder Left Result Date: 03/05/2024 CLINICAL DATA:  Fall Pt was vacuuming car and tripped over cord, fell directly onto face and shoulder on the left side. EXAM: LEFT SHOULDER - 2+ VIEW COMPARISON:  None Available. FINDINGS: Acute displaced left humeral head and neck fracture. Acromioclavicular joint degenerative changes. Soft tissues are unremarkable. IMPRESSION: Acute displaced left  humeral  head and neck fracture. Electronically Signed   By: Morgane  Naveau M.D.   On: 03/05/2024 21:27     MDS (myelodysplastic syndrome), low grade (HCC)  # Low grade MDS- with ringed sideroblasts; no blasts. POSITIVE  For SF3B1; R-IPSS-low risk. MARCH 2023-bone marrow biopsy no evidence of obvious progression of MDS or any acute process.  Currently on  Luspatercept .   # proceed with Luspatercept  # 30 today.  Continueh q 4 weeks given the stability/Hemoglobin- is reasonable; HOLD Aranesp  for now.    # Palpitations: [Dr.Gollan /sp work up 2023- Neg]-intermittent- consider OTC devices for monitoring-  stable   # Leukocytosis-predominant neutrophilia immature cells noted- peripheral blood flow cytometry APRIL 2025- No significant immunophenotypic abnormality detected Neutrophilia with left-shifted maturation and monocytosi ; WBC 27- sec to stress from surgery/no infection- stable.   # vit D def- FEB 2024-Vitamin D - 25/low-  on Vit D 50-continue vitamin D  weekly. OCT 2024-  Vit D-34-recommend every other week. 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  ? Etiology- poor tolerance to cymablta. S/p evaluation with Dr.Vaslow.  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 Consider/recommend COVID/ RSV- stable # handicapped parking.   PS # DISPOSITION:  # today Luspatecept  # follow up in 4 weeks--MD-  labs CBC/cmp;  LDH; Luspatercept  SQ-  Dr.B      All questions were answered. The patient knows to call the clinic with any problems, questions or concerns.   Cindy JONELLE Joe, MD 03/28/2024 10:15 AM

## 2024-03-28 NOTE — Addendum Note (Signed)
 Addended by: JOSHUA ALFONSO CROME on: 03/28/2024 10:16 AM   Modules accepted: Orders

## 2024-04-17 DIAGNOSIS — S42252D Displaced fracture of greater tuberosity of left humerus, subsequent encounter for fracture with routine healing: Secondary | ICD-10-CM | POA: Diagnosis not present

## 2024-04-17 DIAGNOSIS — M545 Low back pain, unspecified: Secondary | ICD-10-CM | POA: Diagnosis not present

## 2024-04-19 DIAGNOSIS — S42202D Unspecified fracture of upper end of left humerus, subsequent encounter for fracture with routine healing: Secondary | ICD-10-CM | POA: Diagnosis not present

## 2024-04-19 DIAGNOSIS — M6281 Muscle weakness (generalized): Secondary | ICD-10-CM | POA: Diagnosis not present

## 2024-04-19 DIAGNOSIS — M25612 Stiffness of left shoulder, not elsewhere classified: Secondary | ICD-10-CM | POA: Diagnosis not present

## 2024-04-24 DIAGNOSIS — S42202D Unspecified fracture of upper end of left humerus, subsequent encounter for fracture with routine healing: Secondary | ICD-10-CM | POA: Diagnosis not present

## 2024-04-24 DIAGNOSIS — M25612 Stiffness of left shoulder, not elsewhere classified: Secondary | ICD-10-CM | POA: Diagnosis not present

## 2024-04-24 DIAGNOSIS — M47816 Spondylosis without myelopathy or radiculopathy, lumbar region: Secondary | ICD-10-CM | POA: Diagnosis not present

## 2024-04-24 DIAGNOSIS — M6281 Muscle weakness (generalized): Secondary | ICD-10-CM | POA: Diagnosis not present

## 2024-04-24 DIAGNOSIS — M791 Myalgia, unspecified site: Secondary | ICD-10-CM | POA: Diagnosis not present

## 2024-04-25 ENCOUNTER — Inpatient Hospital Stay

## 2024-04-25 ENCOUNTER — Ambulatory Visit

## 2024-04-25 ENCOUNTER — Ambulatory Visit: Admitting: Internal Medicine

## 2024-04-25 ENCOUNTER — Inpatient Hospital Stay (HOSPITAL_BASED_OUTPATIENT_CLINIC_OR_DEPARTMENT_OTHER): Admitting: Internal Medicine

## 2024-04-25 ENCOUNTER — Encounter: Payer: Self-pay | Admitting: Internal Medicine

## 2024-04-25 ENCOUNTER — Other Ambulatory Visit

## 2024-04-25 VITALS — BP 151/64 | HR 66 | Temp 97.9°F | Resp 19 | Ht 64.0 in

## 2024-04-25 DIAGNOSIS — D46Z Other myelodysplastic syndromes: Secondary | ICD-10-CM

## 2024-04-25 DIAGNOSIS — D461 Refractory anemia with ring sideroblasts: Secondary | ICD-10-CM | POA: Diagnosis not present

## 2024-04-25 LAB — CBC WITH DIFFERENTIAL (CANCER CENTER ONLY)
Abs Immature Granulocytes: 2.44 K/uL — ABNORMAL HIGH (ref 0.00–0.07)
Basophils Absolute: 0.1 K/uL (ref 0.0–0.1)
Basophils Relative: 1 %
Eosinophils Absolute: 0 K/uL (ref 0.0–0.5)
Eosinophils Relative: 0 %
HCT: 28.3 % — ABNORMAL LOW (ref 36.0–46.0)
Hemoglobin: 8.8 g/dL — ABNORMAL LOW (ref 12.0–15.0)
Immature Granulocytes: 11 %
Lymphocytes Relative: 7 %
Lymphs Abs: 1.5 K/uL (ref 0.7–4.0)
MCH: 29.6 pg (ref 26.0–34.0)
MCHC: 31.1 g/dL (ref 30.0–36.0)
MCV: 95.3 fL (ref 80.0–100.0)
Monocytes Absolute: 2.2 K/uL — ABNORMAL HIGH (ref 0.1–1.0)
Monocytes Relative: 10 %
Neutro Abs: 15.8 K/uL — ABNORMAL HIGH (ref 1.7–7.7)
Neutrophils Relative %: 71 %
Platelet Count: 236 K/uL (ref 150–400)
RBC: 2.97 MIL/uL — ABNORMAL LOW (ref 3.87–5.11)
RDW: 29.4 % — ABNORMAL HIGH (ref 11.5–15.5)
WBC Count: 22.1 K/uL — ABNORMAL HIGH (ref 4.0–10.5)
nRBC: 1.8 % — ABNORMAL HIGH (ref 0.0–0.2)

## 2024-04-25 LAB — CMP (CANCER CENTER ONLY)
ALT: 13 U/L (ref 0–44)
AST: 20 U/L (ref 15–41)
Albumin: 4.5 g/dL (ref 3.5–5.0)
Alkaline Phosphatase: 64 U/L (ref 38–126)
Anion gap: 8 (ref 5–15)
BUN: 26 mg/dL — ABNORMAL HIGH (ref 8–23)
CO2: 21 mmol/L — ABNORMAL LOW (ref 22–32)
Calcium: 9.4 mg/dL (ref 8.9–10.3)
Chloride: 106 mmol/L (ref 98–111)
Creatinine: 0.92 mg/dL (ref 0.44–1.00)
GFR, Estimated: >60 mL/min (ref >60)
Glucose, Bld: 127 mg/dL — ABNORMAL HIGH (ref 70–99)
Potassium: 4.1 mmol/L (ref 3.5–5.1)
Sodium: 135 mmol/L (ref 135–145)
Total Bilirubin: 0.7 mg/dL (ref 0.0–1.2)
Total Protein: 8.9 g/dL — ABNORMAL HIGH (ref 6.5–8.1)

## 2024-04-25 LAB — LACTATE DEHYDROGENASE: LDH: 192 U/L (ref 98–192)

## 2024-04-25 MED ORDER — LUSPATERCEPT-AAMT 75 MG ~~LOC~~ SOLR
75.0000 mg | Freq: Once | SUBCUTANEOUS | Status: AC
  Administered 2024-04-25: 75 mg via SUBCUTANEOUS
  Filled 2024-04-25: qty 1.5

## 2024-04-25 NOTE — Assessment & Plan Note (Addendum)
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 MARCH 2023-bone marrow biopsy no evidence of obvious progression of MDS or any acute process.  Currently on  Luspatercept .   # proceed with Luspatercept  # 31  today.  Continueh q 4 weeks given the stability/Hemoglobin- is reasonable; HOLD Aranesp  for now.    # Palpitations: [Dr.Gollan /sp work up 2023- Neg]-intermittent- consider OTC devices for monitoring-  stable   # Leukocytosis-predominant neutrophilia immature cells noted- peripheral blood flow cytometry APRIL 2025- No significant immunophenotypic abnormality detected Neutrophilia with left-shifted maturation and monocytosi ; WBC 24- sec to stress from surgery/steriodal injection- - stable.   # vit D def- FEB 2024-Vitamin D - 25/low-  on Vit D 50-continue vitamin D  weekly. OCT 2024-  Vit D-34-recommend every other week. 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  ? Etiology- poor tolerance to cymablta. S/p evaluation with Dr.Vaslow.  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 Consider/recommend COVID/ RSV- stable # handicapped parking.   PS # DISPOSITION:  # today Luspatecept  # follow up in 4 weeks--MD-  labs CBC/cmp;  LDH; Luspatercept  SQ-  Dr.B

## 2024-04-25 NOTE — Progress Notes (Signed)
 [facility] CONSULT NOTE  Patient Care Team: [name], [name], MD as PCP - General Rennie, Cindy SAUNDERS, MD as Consulting Physician (Oncology)  CHIEF COMPLAINTS/PURPOSE OF CONSULTATION: MDS   Oncology History Overview Note   # July 2020-myelodysplastic syndrome with ringed sideroblasts-Normal karyotype; no blasts [IPSS R-Very low risk ~median survival 8.3 years]; iron studies B12 folic acid  myeloma panel normal; pyridoxine levels/copper /zinc -WNL. Erythropoietin  levels-60. II OPINION at [facility] [Dr.DeCastro]  # [facility]/ NGS: TET2([id number])c.2524delT(p.Ser842GlnfsTer31) Exon 3 frame-shift SF3B1([id number])c.2098A>G(p.Lys700Glu) Exon 15 missense  DNMT3A([id number])c.2204A>G(p.Tyr735Cys) Exon 19 missense   # JAN 11th 2020- Aranesp /retacrit;  # July 30th, 2021- START REVLIMID  10 mg/day  [SF3B89mutation]  MARCH 2023- BONE MARROW, ASPIRATE, CLOT, CORE:  -  Hypercellular marrow involved by myelodysplastic syndrome with ring  sideroblasts (MDS-RS)  -  Polytypic plasmacytosis   # MARCh 2023-increase the frequency of Aranesp  400 mcg every 2 weeks; continue Revlimid .   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  Ambulating independently.  Carla [name] 73 y.o.  female history of low-grade MDS anemia with ring sideroblasts currently on Luspatercept  is here  for follow-up.    Patient seems to be healing well from her shoulder surgery.  However noted to have history of back pain with spasms.  Status post evaluation with neurology-s/p injection.  Patient continues to have occasional SOB; otherwise no worsening cough or leg swelling. Appetite is fairly good. Still taking Calcium+D with the prescription Vitamin D .   Review of Systems  Constitutional:  Positive for malaise/fatigue. Negative for chills, diaphoresis and fever.  HENT:  Negative for nosebleeds and sore throat.   Eyes:  Negative for double vision.  Respiratory:  Negative for cough, hemoptysis, sputum production, shortness of breath and wheezing.   Cardiovascular:  Negative for chest pain and orthopnea.  Gastrointestinal:  Negative for abdominal pain, blood in stool, constipation, diarrhea, heartburn, melena and vomiting.  Genitourinary:  Negative for dysuria, frequency and urgency.  Musculoskeletal:  Positive for back pain and joint pain.  Skin: Negative.  Negative for itching and rash.  Neurological:  Negative for dizziness, tingling, focal weakness, weakness and headaches.  Endo/Heme/Allergies:  Does not bruise/bleed easily.  Psychiatric/Behavioral:  Negative for depression. The patient is not nervous/anxious and does not have insomnia.     MEDICAL HISTORY:  Past Medical History:  Diagnosis Date   Allergic rhinitis, cause unspecified    Anemia, unspecified    Arthritis    Cancer (HCC)    Carpal tunnel syndrome    Cataract 2007/2010   Cystitis, unspecified    Family history of osteoporosis    Hypothyroidism    PONV (postoperative nausea and vomiting)    Postnasal drip    Syncope and collapse     SURGICAL HISTORY: Past Surgical History:  Procedure Laterality Date   BONE MARROW BIOPSY     CARPAL TUNNEL RELEASE  COLONOSCOPY  09/28/2003   COLONOSCOPY WITH PROPOFOL  N/A 07/31/2015   Procedure: COLONOSCOPY WITH PROPOFOL ;  Surgeon: Lamar Bunk, MD;  Location: [facility]  ENDOSCOPY;  Service: Endoscopy;  Laterality: N/A;   DIAGNOSTIC LAPAROSCOPY     dx lack of pregnancy    EYE SURGERY     lasik, cataract, prk,yag procedure   REVERSE SHOULDER ARTHROPLASTY Left 03/21/2024   Procedure: ARTHROPLASTY, SHOULDER, TOTAL, REVERSE FOR FRACTURE;  Surgeon: Cristy Bonner DASEN, MD;  Location: [facility] ORS;  Service: Orthopedics;  Laterality: Left;    SOCIAL HISTORY: Social History   Socioeconomic History   Marital status: Married    Spouse name: Not on file   Number of children: Not on file   Years of education: Not on file   Highest education level: Not on file  Occupational History   Not on file  Tobacco Use   Smoking status: Never   Smokeless tobacco: Never  Vaping Use   Vaping status: Never Used  Substance and Sexual Activity   Alcohol use: Not Currently    Comment: Rare   Drug use: No   Sexual activity: Not Currently  Other Topics Concern   Not on file  Social History Narrative   No regular exercise      Drinks lots of [employer]         Social Drivers of Health   Financial Resource Strain: Low Risk  (05/23/2023)   Overall Financial Resource Strain (CARDIA)    Difficulty of Paying Living Expenses: Not hard at all  Food Insecurity: No Food Insecurity (05/23/2023)   Hunger Vital Sign    Worried About Running Out of Food in the Last Year: Never true    Ran Out of Food in the Last Year: Never true  Transportation Needs: No Transportation Needs (05/23/2023)   PRAPARE - Administrator, Civil Service (Medical): No    Lack of Transportation (Non-Medical): No  Physical Activity: Inactive (05/23/2023)   Exercise Vital Sign    Days of Exercise per Week: 0 days    Minutes of Exercise per Session: 0 min  Stress: No Stress Concern Present (05/23/2023)   [employer] of Occupational Health - Occupational Stress Questionnaire    Feeling of Stress : Not at all  Social Connections: Moderately Integrated (05/23/2023)   Social Connection and Isolation Panel     Frequency of Communication with Friends and Family: More than three times a week    Frequency of Social Gatherings with Friends and Family: More than three times a week    Attends Religious Services: More than 4 times per year    Active Member of [employer] or Organizations: No    Attends Banker Meetings: Never    Marital Status: Married  Catering manager Violence: Not At Risk (05/23/2023)   Humiliation, Afraid, Rape, and Kick questionnaire    Fear of Current or Ex-Partner: No    Emotionally Abused: No    Physically Abused: No    Sexually Abused: No    FAMILY HISTORY: Family History  Problem Relation Age of Onset   Osteoporosis Mother    Stroke Mother    Arthritis Mother    Coronary artery disease Father    Stroke Father 13   Arthritis Father    Heart disease Father    Diabetes Other        Aunts and uncles   Breast cancer Paternal Aunt    Breast cancer Maternal Aunt    Arthritis/Rheumatoid Child  Breast cancer Cousin    Cancer Maternal Aunt    Cancer Maternal Aunt    Cancer Paternal Aunt    Diabetes Paternal Aunt    Diabetes Sister    Diabetes Paternal Uncle    Heart disease Paternal Uncle    Stroke Paternal Uncle    Diabetes Paternal Aunt    Stroke Paternal Aunt     ALLERGIES:  is allergic to codeine.  MEDICATIONS:  Current Outpatient Medications  Medication Sig Dispense Refill   amoxicillin -clavulanate (AUGMENTIN ) 875-125 MG tablet Take 1 tablet by mouth every 12 (twelve) hours. (Patient not taking: Reported on 03/28/2024) 14 tablet 0   ASPIRIN  LOW DOSE 81 MG chewable tablet Chew 81 mg by mouth 2 (two) times daily.     calcium-vitamin D  (OSCAL WITH D) 500-5 MG-MCG tablet Take 1 tablet by mouth daily with breakfast.     celecoxib  (CELEBREX ) 100 MG capsule Take 100 mg by mouth 2 (two) times daily.     Darbepoetin Alfa  (ARANESP ) 500 MCG/ML SOSY injection Inject 500 mcg into the skin See admin instructions. Inject 500 mcg into the skin every week      levothyroxine  (SYNTHROID ) 25 MCG tablet TAKE ONE TAB BY MOUTH ONCE DAILY. TAKE ON AN EMPTY STOMACH WITH A GLASS OF WATER ATLEAST 30-60 MINUTES BEFORE BREAKFAST 90 tablet 3   loratadine (CLARITIN) 10 MG tablet Take 10 mg by mouth at bedtime.     luspatercept -aamt (REBLOZYL ) 75 MG subcutaneous injection Inject into the skin.     methocarbamol (ROBAXIN) 500 MG tablet Take 500 mg by mouth every 8 (eight) hours as needed.     omeprazole  (PRILOSEC) 20 MG capsule Take 20 mg by mouth daily.     promethazine  (PHENERGAN ) 25 MG tablet Take 1 tablet (25 mg total) by mouth every 6 (six) hours as needed for nausea or vomiting. (Patient not taking: Reported on 03/28/2024) 10 tablet 1   Vitamin D , Ergocalciferol , (DRISDOL ) 1.25 MG (50000 UNIT) CAPS capsule Take 1 capsule (50,000 Units total) by mouth once a week. 12 capsule 1   No current facility-administered medications for this visit.   Facility-Administered Medications Ordered in Other Visits  Medication Dose Route Frequency Provider Last Rate Last Admin   heparin  lock flush 100 unit/mL  500 Units Intravenous Once Shirlette Scarber R, MD       sodium chloride  flush (NS) 0.9 % injection 10 mL  10 mL Intravenous PRN Izel Hochberg R, MD          PHYSICAL EXAMINATION:   Vitals:   04/25/24 1507  BP: (!) 151/64  Pulse: 66  Resp: 19  Temp: 97.9 F (36.6 C)  SpO2: 97%        There were no vitals filed for this visit.       Physical Exam HENT:     Head: Normocephalic and atraumatic.     Mouth/Throat:     Pharynx: No oropharyngeal exudate.  Eyes:     Pupils: Pupils are equal, round, and reactive to light.  Cardiovascular:     Rate and Rhythm: Normal rate and regular rhythm.     Pulses: Normal pulses.     Heart sounds: Normal heart sounds.  Pulmonary:     Effort: Pulmonary effort is normal. No respiratory distress.     Breath sounds: Normal breath sounds. No wheezing.  Abdominal:     General: Bowel sounds are normal. There is  no distension.     Palpations: Abdomen is soft. There is no mass.  Tenderness: There is no abdominal tenderness. There is no guarding or rebound.  Musculoskeletal:        General: No tenderness. Normal range of motion.     Cervical back: Normal range of motion and neck supple.  Skin:    General: Skin is warm.  Neurological:     Mental Status: She is alert and oriented to person, place, and time.  Psychiatric:        Mood and Affect: Affect normal.     LABORATORY DATA:  I have reviewed the data as listed Lab Results  Component Value Date   WBC 22.1 (H) 04/25/2024   HGB 8.8 (L) 04/25/2024   HCT 28.3 (L) 04/25/2024   MCV 95.3 04/25/2024   PLT 236 04/25/2024   Recent Labs    02/23/24 1020 03/28/24 0903 04/25/24 1444  NA 137 137 135  K 4.2 4.2 4.1  CL 104 110 106  CO2 22 21* 21*  GLUCOSE 142* 161* 127*  BUN 19 18 26*  CREATININE 1.02* 0.84 0.92  CALCIUM 9.2 8.5* 9.4  GFRNONAA 58* >60 >60  PROT 8.1 7.9 8.9*  ALBUMIN 4.3 3.7 4.5  AST 23 24 20   ALT 15 20 13   ALKPHOS 42 53 64  BILITOT 0.8 0.7 0.7     No results found.    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 POSITIVE  For SF3B1; R-IPSS-low risk. MARCH 2023-bone marrow biopsy no evidence of obvious progression of MDS or any acute process.  Currently on  Luspatercept .   # proceed with Luspatercept  # 31  today.  Continueh q 4 weeks given the stability/Hemoglobin- is reasonable; HOLD Aranesp  for now.    # Palpitations: [Dr.Gollan /sp work up 2023- Neg]-intermittent- consider OTC devices for monitoring-  stable   # Leukocytosis-predominant neutrophilia immature cells noted- peripheral blood flow cytometry APRIL 2025- No significant immunophenotypic abnormality detected Neutrophilia with left-shifted maturation and monocytosi ; WBC 24- sec to stress from surgery/steriodal injection- - stable.   # vit D def- FEB 2024-Vitamin D - 25/low-  on Vit D  50-continue vitamin D  weekly. OCT 2024-  Vit D-34-recommend every other week. 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  ? Etiology- poor tolerance to cymablta. S/p evaluation with Dr.Vaslow.  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 Consider/recommend COVID/ RSV- stable # handicapped parking.   PS # DISPOSITION:  # today Luspatecept  # follow up in 4 weeks--MD-  labs CBC/cmp;  LDH; Luspatercept  SQ-  Dr.B      All questions were answered. The patient knows to call the clinic with any problems, questions or concerns.   Cindy JONELLE Joe, MD 04/25/2024 3:45 PM

## 2024-04-25 NOTE — Progress Notes (Signed)
 Patient has no new or acute concerns at this time.

## 2024-04-26 ENCOUNTER — Other Ambulatory Visit: Payer: Self-pay

## 2024-04-26 DIAGNOSIS — M6281 Muscle weakness (generalized): Secondary | ICD-10-CM | POA: Diagnosis not present

## 2024-04-26 DIAGNOSIS — S42202D Unspecified fracture of upper end of left humerus, subsequent encounter for fracture with routine healing: Secondary | ICD-10-CM | POA: Diagnosis not present

## 2024-04-26 DIAGNOSIS — M25612 Stiffness of left shoulder, not elsewhere classified: Secondary | ICD-10-CM | POA: Diagnosis not present

## 2024-04-27 ENCOUNTER — Other Ambulatory Visit: Payer: Self-pay

## 2024-05-01 DIAGNOSIS — M25612 Stiffness of left shoulder, not elsewhere classified: Secondary | ICD-10-CM | POA: Diagnosis not present

## 2024-05-01 DIAGNOSIS — S42202D Unspecified fracture of upper end of left humerus, subsequent encounter for fracture with routine healing: Secondary | ICD-10-CM | POA: Diagnosis not present

## 2024-05-01 DIAGNOSIS — M6281 Muscle weakness (generalized): Secondary | ICD-10-CM | POA: Diagnosis not present

## 2024-05-07 ENCOUNTER — Other Ambulatory Visit: Payer: Self-pay

## 2024-05-07 ENCOUNTER — Other Ambulatory Visit: Payer: Self-pay | Admitting: Internal Medicine

## 2024-05-07 DIAGNOSIS — E559 Vitamin D deficiency, unspecified: Secondary | ICD-10-CM

## 2024-05-08 DIAGNOSIS — M25612 Stiffness of left shoulder, not elsewhere classified: Secondary | ICD-10-CM | POA: Diagnosis not present

## 2024-05-08 DIAGNOSIS — M6281 Muscle weakness (generalized): Secondary | ICD-10-CM | POA: Diagnosis not present

## 2024-05-08 DIAGNOSIS — M47816 Spondylosis without myelopathy or radiculopathy, lumbar region: Secondary | ICD-10-CM | POA: Diagnosis not present

## 2024-05-08 DIAGNOSIS — M791 Myalgia, unspecified site: Secondary | ICD-10-CM | POA: Diagnosis not present

## 2024-05-08 DIAGNOSIS — S42202D Unspecified fracture of upper end of left humerus, subsequent encounter for fracture with routine healing: Secondary | ICD-10-CM | POA: Diagnosis not present

## 2024-05-10 DIAGNOSIS — M25612 Stiffness of left shoulder, not elsewhere classified: Secondary | ICD-10-CM | POA: Diagnosis not present

## 2024-05-10 DIAGNOSIS — M6281 Muscle weakness (generalized): Secondary | ICD-10-CM | POA: Diagnosis not present

## 2024-05-10 DIAGNOSIS — S42202D Unspecified fracture of upper end of left humerus, subsequent encounter for fracture with routine healing: Secondary | ICD-10-CM | POA: Diagnosis not present

## 2024-05-15 DIAGNOSIS — M545 Low back pain, unspecified: Secondary | ICD-10-CM | POA: Diagnosis not present

## 2024-05-16 DIAGNOSIS — M6281 Muscle weakness (generalized): Secondary | ICD-10-CM | POA: Diagnosis not present

## 2024-05-16 DIAGNOSIS — M25612 Stiffness of left shoulder, not elsewhere classified: Secondary | ICD-10-CM | POA: Diagnosis not present

## 2024-05-16 DIAGNOSIS — S42202D Unspecified fracture of upper end of left humerus, subsequent encounter for fracture with routine healing: Secondary | ICD-10-CM | POA: Diagnosis not present

## 2024-05-18 DIAGNOSIS — M6281 Muscle weakness (generalized): Secondary | ICD-10-CM | POA: Diagnosis not present

## 2024-05-18 DIAGNOSIS — M25612 Stiffness of left shoulder, not elsewhere classified: Secondary | ICD-10-CM | POA: Diagnosis not present

## 2024-05-18 DIAGNOSIS — S42202D Unspecified fracture of upper end of left humerus, subsequent encounter for fracture with routine healing: Secondary | ICD-10-CM | POA: Diagnosis not present

## 2024-05-22 DIAGNOSIS — S42202D Unspecified fracture of upper end of left humerus, subsequent encounter for fracture with routine healing: Secondary | ICD-10-CM | POA: Diagnosis not present

## 2024-05-22 DIAGNOSIS — M6281 Muscle weakness (generalized): Secondary | ICD-10-CM | POA: Diagnosis not present

## 2024-05-22 DIAGNOSIS — M25612 Stiffness of left shoulder, not elsewhere classified: Secondary | ICD-10-CM | POA: Diagnosis not present

## 2024-05-23 ENCOUNTER — Encounter: Payer: Self-pay | Admitting: Internal Medicine

## 2024-05-23 ENCOUNTER — Inpatient Hospital Stay

## 2024-05-23 ENCOUNTER — Inpatient Hospital Stay (HOSPITAL_BASED_OUTPATIENT_CLINIC_OR_DEPARTMENT_OTHER): Admitting: Internal Medicine

## 2024-05-23 ENCOUNTER — Inpatient Hospital Stay: Attending: Internal Medicine

## 2024-05-23 VITALS — BP 126/69 | HR 65 | Temp 96.6°F | Resp 17 | Ht 64.0 in | Wt 155.0 lb

## 2024-05-23 DIAGNOSIS — D461 Refractory anemia with ring sideroblasts: Secondary | ICD-10-CM | POA: Insufficient documentation

## 2024-05-23 DIAGNOSIS — D46Z Other myelodysplastic syndromes: Secondary | ICD-10-CM

## 2024-05-23 LAB — CBC WITH DIFFERENTIAL/PLATELET
Abs Immature Granulocytes: 0.74 K/uL — ABNORMAL HIGH (ref 0.00–0.07)
Basophils Absolute: 0.1 K/uL (ref 0.0–0.1)
Basophils Relative: 1 %
Eosinophils Absolute: 0.1 K/uL (ref 0.0–0.5)
Eosinophils Relative: 1 %
HCT: 31 % — ABNORMAL LOW (ref 36.0–46.0)
Hemoglobin: 9.8 g/dL — ABNORMAL LOW (ref 12.0–15.0)
Immature Granulocytes: 5 %
Lymphocytes Relative: 13 %
Lymphs Abs: 1.9 K/uL (ref 0.7–4.0)
MCH: 30.5 pg (ref 26.0–34.0)
MCHC: 31.6 g/dL (ref 30.0–36.0)
MCV: 96.6 fL (ref 80.0–100.0)
Monocytes Absolute: 2.1 K/uL — ABNORMAL HIGH (ref 0.1–1.0)
Monocytes Relative: 15 %
Neutro Abs: 9.2 K/uL — ABNORMAL HIGH (ref 1.7–7.7)
Neutrophils Relative %: 65 %
Platelets: 202 K/uL (ref 150–400)
RBC: 3.21 MIL/uL — ABNORMAL LOW (ref 3.87–5.11)
RDW: 29.2 % — ABNORMAL HIGH (ref 11.5–15.5)
WBC: 14.2 K/uL — ABNORMAL HIGH (ref 4.0–10.5)
nRBC: 0.9 % — ABNORMAL HIGH (ref 0.0–0.2)

## 2024-05-23 LAB — CMP (CANCER CENTER ONLY)
ALT: 14 U/L (ref 0–44)
AST: 18 U/L (ref 15–41)
Albumin: 4.1 g/dL (ref 3.5–5.0)
Alkaline Phosphatase: 68 U/L (ref 38–126)
Anion gap: 9 (ref 5–15)
BUN: 32 mg/dL — ABNORMAL HIGH (ref 8–23)
CO2: 19 mmol/L — ABNORMAL LOW (ref 22–32)
Calcium: 9.1 mg/dL (ref 8.9–10.3)
Chloride: 107 mmol/L (ref 98–111)
Creatinine: 1.09 mg/dL — ABNORMAL HIGH (ref 0.44–1.00)
GFR, Estimated: 54 mL/min — ABNORMAL LOW (ref >60)
Glucose, Bld: 133 mg/dL — ABNORMAL HIGH (ref 70–99)
Potassium: 4.2 mmol/L (ref 3.5–5.1)
Sodium: 135 mmol/L (ref 135–145)
Total Bilirubin: 0.8 mg/dL (ref 0.0–1.2)
Total Protein: 7.7 g/dL (ref 6.5–8.1)

## 2024-05-23 LAB — LACTATE DEHYDROGENASE: LDH: 148 U/L (ref 98–192)

## 2024-05-23 MED ORDER — LUSPATERCEPT-AAMT 75 MG ~~LOC~~ SOLR
1.0000 mg/kg | Freq: Once | SUBCUTANEOUS | Status: AC
  Administered 2024-05-23: 70 mg via SUBCUTANEOUS
  Filled 2024-05-23: qty 1.4

## 2024-05-23 NOTE — Progress Notes (Signed)
 Concerns today of neuropathy and shoulder pain

## 2024-05-23 NOTE — Progress Notes (Signed)
 [facility] CONSULT NOTE  Patient Care Team: [name], [name], MD as PCP - General Rennie, Cindy SAUNDERS, MD as Consulting Physician (Oncology)  CHIEF COMPLAINTS/PURPOSE OF CONSULTATION: MDS   Oncology History Overview Note   # July 2020-myelodysplastic syndrome with ringed sideroblasts-Normal karyotype; no blasts [IPSS R-Very low risk ~median survival 8.3 years]; iron studies B12 folic acid  myeloma panel normal; pyridoxine levels/copper /zinc -WNL. Erythropoietin  levels-60. II OPINION at [facility] [Dr.DeCastro]  # [facility]/ NGS: TET2([id number])c.2524delT(p.Ser842GlnfsTer31) Exon 3 frame-shift SF3B1([id number])c.2098A>G(p.Lys700Glu) Exon 15 missense  DNMT3A([id number])c.2204A>G(p.Tyr735Cys) Exon 19 missense   # JAN 11th 2020- Aranesp /retacrit;  # July 30th, 2021- START REVLIMID  10 mg/day  [SF3B64mutation]  MARCH 2023- BONE MARROW, ASPIRATE, CLOT, CORE:  -  Hypercellular marrow involved by myelodysplastic syndrome with ring  sideroblasts (MDS-RS)  -  Polytypic plasmacytosis   # MARCh 2023-increase the frequency of Aranesp  400 mcg every 2 weeks; continue Revlimid .   # JULy 2023- DISCONTINUED REVLIMID   # AUG 84u, 2023- Start LUSPATERCEPT   #Mild hypothyroidism-on Synthroid ; September 2021 ejection fraction 60 to 65%;   # colonoscopy- 2016 [Dr.Bucinni];  DIAGNOSIS: MDS/low-grade with ringed sideroblasts  STAGE: Low       ;  GOALS: Control     MDS (myelodysplastic syndrome), low grade (HCC)  04/23/2019 Initial Diagnosis   MDS (myelodysplastic syndrome), low grade (HCC)   05/11/2022 - 05/11/2022 Chemotherapy   Patient is on Treatment Plan : MYELODYSPLASIA Luspatercept  q21d     05/11/2022 -  Chemotherapy   Patient is on Treatment Plan : MYELODYSPLASIA Luspatercept  q21d      HISTORY OF PRESENTING ILLNESS: Accompanied by husband.  Ambulating independently.  Carla [name] 72 y.o.  female history of low-grade MDS anemia with ring sideroblasts currently on Luspatercept  is here  for follow-up.    Concerns today of neuropathy and shoulder pain.   Patient seems to be healing well from her shoulder surgery.  However noted to have history of back pain with spasms.  Status post evaluation with neurology-s/p injection.  Patient continues to have occasional SOB; otherwise no worsening cough or leg swelling. Appetite is fairly good. Still taking Calcium+D with the prescription Vitamin D .   Review of Systems  Constitutional:  Positive for malaise/fatigue. Negative for chills, diaphoresis and fever.  HENT:  Negative for nosebleeds and sore throat.   Eyes:  Negative for double vision.  Respiratory:  Negative for cough, hemoptysis, sputum production, shortness of breath and wheezing.   Cardiovascular:  Negative for chest pain and orthopnea.  Gastrointestinal:  Negative for abdominal pain, blood in stool, constipation, diarrhea, heartburn, melena and vomiting.  Genitourinary:  Negative for dysuria, frequency and urgency.  Musculoskeletal:  Positive for back pain and joint pain.  Skin: Negative.  Negative for itching and rash.  Neurological:  Negative for dizziness, tingling, focal weakness, weakness and headaches.  Endo/Heme/Allergies:  Does not bruise/bleed easily.  Psychiatric/Behavioral:  Negative for depression. The patient is not nervous/anxious and does not have insomnia.     MEDICAL HISTORY:  Past Medical History:  Diagnosis Date   Allergic rhinitis, cause unspecified    Anemia, unspecified    Arthritis    Cancer (HCC)    Carpal tunnel syndrome    Cataract 2007/2010   Cystitis, unspecified    Family history of osteoporosis    Hypothyroidism    PONV (postoperative nausea and vomiting)    Postnasal drip    Syncope and collapse     SURGICAL HISTORY: Past Surgical History:  Procedure Laterality Date   BONE MARROW BIOPSY  CARPAL TUNNEL RELEASE     COLONOSCOPY  09/28/2003   COLONOSCOPY WITH PROPOFOL  N/A 07/31/2015   Procedure: COLONOSCOPY WITH PROPOFOL ;   Surgeon: Lamar Bunk, MD;  Location: [facility];  Service: Endoscopy;  Laterality: N/A;   DIAGNOSTIC LAPAROSCOPY     dx lack of pregnancy    EYE SURGERY     lasik, cataract, prk,yag procedure   REVERSE SHOULDER ARTHROPLASTY Left 03/21/2024   Procedure: ARTHROPLASTY, SHOULDER, TOTAL, REVERSE FOR FRACTURE;  Surgeon: Cristy Bonner DASEN, MD;  Location: [facility];  Service: Orthopedics;  Laterality: Left;    SOCIAL HISTORY: Social History   Socioeconomic History   Marital status: Married    Spouse name: Not on file   Number of children: Not on file   Years of education: Not on file   Highest education level: Not on file  Occupational History   Not on file  Tobacco Use   Smoking status: Never   Smokeless tobacco: Never  Vaping Use   Vaping status: Never Used  Substance and Sexual Activity   Alcohol use: Not Currently    Comment: Rare   Drug use: No   Sexual activity: Not Currently  Other Topics Concern   Not on file  Social History Narrative   No regular exercise      Drinks lots of [employer]         Social Drivers of Health   Financial Resource Strain: Low Risk  (05/23/2023)   Overall Financial Resource Strain (CARDIA)    Difficulty of Paying Living Expenses: Not hard at all  Food Insecurity: No Food Insecurity (05/23/2023)   Hunger Vital Sign    Worried About Running Out of Food in the Last Year: Never true    Ran Out of Food in the Last Year: Never true  Transportation Needs: No Transportation Needs (05/23/2023)   PRAPARE - Administrator, Civil Service (Medical): No    Lack of Transportation (Non-Medical): No  Physical Activity: Inactive (05/23/2023)   Exercise Vital Sign    Days of Exercise per Week: 0 days    Minutes of Exercise per Session: 0 min  Stress: No Stress Concern Present (05/23/2023)   [employer] of Occupational Health - Occupational Stress Questionnaire    Feeling of Stress : Not at all  Social Connections: Moderately Integrated (05/23/2023)    Social Connection and Isolation Panel    Frequency of Communication with Friends and Family: More than three times a week    Frequency of Social Gatherings with Friends and Family: More than three times a week    Attends Religious Services: More than 4 times per year    Active Member of [employer] or Organizations: No    Attends Banker Meetings: Never    Marital Status: Married  Catering manager Violence: Not At Risk (05/23/2023)   Humiliation, Afraid, Rape, and Kick questionnaire    Fear of Current or Ex-Partner: No    Emotionally Abused: No    Physically Abused: No    Sexually Abused: No    FAMILY HISTORY: Family History  Problem Relation Age of Onset   Osteoporosis Mother    Stroke Mother    Arthritis Mother    Coronary artery disease Father    Stroke Father 87   Arthritis Father    Heart disease Father    Diabetes Other        Aunts and uncles   Breast cancer Paternal Aunt    Breast cancer Maternal Aunt  Arthritis/Rheumatoid Child    Breast cancer Cousin    Cancer Maternal Aunt    Cancer Maternal Aunt    Cancer Paternal Aunt    Diabetes Paternal Aunt    Diabetes Sister    Diabetes Paternal Uncle    Heart disease Paternal Uncle    Stroke Paternal Uncle    Diabetes Paternal Aunt    Stroke Paternal Aunt     ALLERGIES:  is allergic to codeine.  MEDICATIONS:  Current Outpatient Medications  Medication Sig Dispense Refill   acetaminophen  (TYLENOL ) 500 MG tablet Take 500 mg by mouth every 6 (six) hours as needed for mild pain (pain score 1-3).     ASPIRIN  LOW DOSE 81 MG chewable tablet Chew 81 mg by mouth 2 (two) times daily.     calcium-vitamin D  (OSCAL WITH D) 500-5 MG-MCG tablet Take 1 tablet by mouth daily with breakfast.     celecoxib  (CELEBREX ) 100 MG capsule Take 100 mg by mouth 2 (two) times daily.     Darbepoetin Alfa  (ARANESP ) 500 MCG/ML SOSY injection Inject 500 mcg into the skin See admin instructions. Inject 500 mcg into the skin every week      levothyroxine  (SYNTHROID ) 25 MCG tablet TAKE ONE TAB BY MOUTH ONCE DAILY. TAKE ON AN EMPTY STOMACH WITH A GLASS OF WATER ATLEAST 30-60 MINUTES BEFORE BREAKFAST 90 tablet 3   loratadine (CLARITIN) 10 MG tablet Take 10 mg by mouth at bedtime.     luspatercept -aamt (REBLOZYL ) 75 MG subcutaneous injection Inject into the skin.     methocarbamol (ROBAXIN) 500 MG tablet Take 500 mg by mouth every 8 (eight) hours as needed.     omeprazole  (PRILOSEC) 20 MG capsule Take 20 mg by mouth daily.     Vitamin D , Ergocalciferol , (DRISDOL ) 1.25 MG (50000 UNIT) CAPS capsule TAKE 1 CAPSULES BY MOUTH ONCE A WEEK 12 capsule 1   promethazine  (PHENERGAN ) 25 MG tablet Take 1 tablet (25 mg total) by mouth every 6 (six) hours as needed for nausea or vomiting. (Patient not taking: Reported on 05/23/2024) 10 tablet 1   No current facility-administered medications for this visit.   Facility-Administered Medications Ordered in Other Visits  Medication Dose Route Frequency Provider Last Rate Last Admin   heparin  lock flush 100 unit/mL  500 Units Intravenous Once Afton Lavalle R, MD       luspatercept -aamt (REBLOZYL ) subcutaneous injection 70 mg  1 mg/kg (Treatment Plan Recorded) Subcutaneous Once Myles Mallicoat R, MD       sodium chloride  flush (NS) 0.9 % injection 10 mL  10 mL Intravenous PRN Ludia Gartland R, MD          PHYSICAL EXAMINATION:   Vitals:   05/23/24 0933  BP: 126/69  Pulse: 65  Resp: 17  Temp: (!) 96.6 F (35.9 C)  SpO2: 98%        Filed Weights   05/23/24 0933  Weight: 155 lb (70.3 kg)         Physical Exam HENT:     Head: Normocephalic and atraumatic.     Mouth/Throat:     Pharynx: No oropharyngeal exudate.  Eyes:     Pupils: Pupils are equal, round, and reactive to light.  Cardiovascular:     Rate and Rhythm: Normal rate and regular rhythm.     Pulses: Normal pulses.     Heart sounds: Normal heart sounds.  Pulmonary:     Effort: Pulmonary effort is  normal. No respiratory distress.     Breath  sounds: Normal breath sounds. No wheezing.  Abdominal:     General: Bowel sounds are normal. There is no distension.     Palpations: Abdomen is soft. There is no mass.     Tenderness: There is no abdominal tenderness. There is no guarding or rebound.  Musculoskeletal:        General: No tenderness. Normal range of motion.     Cervical back: Normal range of motion and neck supple.  Skin:    General: Skin is warm.  Neurological:     Mental Status: She is alert and oriented to person, place, and time.  Psychiatric:        Mood and Affect: Affect normal.     LABORATORY DATA:  I have reviewed the data as listed Lab Results  Component Value Date   WBC 14.2 (H) 05/23/2024   HGB 9.8 (L) 05/23/2024   HCT 31.0 (L) 05/23/2024   MCV 96.6 05/23/2024   PLT 202 05/23/2024   Recent Labs    03/28/24 0903 04/25/24 1444 05/23/24 0914  NA 137 135 135  K 4.2 4.1 4.2  CL 110 106 107  CO2 21* 21* 19*  GLUCOSE 161* 127* 133*  BUN 18 26* 32*  CREATININE 0.84 0.92 1.09*  CALCIUM 8.5* 9.4 9.1  GFRNONAA >60 >60 54*  PROT 7.9 8.9* 7.7  ALBUMIN 3.7 4.5 4.1  AST 24 20 18   ALT 20 13 14   ALKPHOS 53 64 68  BILITOT 0.7 0.7 0.8     No results found.    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 POSITIVE  For SF3B1; R-IPSS-low risk. MARCH 2023-bone marrow biopsy no evidence of obvious progression of MDS or any acute process.  Currently on  Luspatercept .   # proceed with Luspatercept  # 32  today.  Continueh q 4 weeks given the stability/Hemoglobin- is reasonable;- stable.    # Leukocytosis-predominant neutrophilia immature cells noted- peripheral blood flow cytometry APRIL 2025- No significant immunophenotypic abnormality detected Neutrophilia with left-shifted maturation and monocytosis- stable.   # vit D def- FEB 2024-Vitamin D - 25/low-  on Vit D 50-continue vitamin D  weekly. OCT 2024-   Vit D-34-recommend every other week. 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  ? Etiology- poor tolerance to cymablta. S/p evaluation with Dr.Vaslow.  currently waiting on gabapentin  [Dr.Patel]- defer to neurology.   # [facility]- s/p shoulder surgery- s/p MRI for back [[place]]- await results.   # CKD- stage III- 50s- monitor for now-stable  # Vaccination: s/p flu shot. Consider/recommend COVID/ RSV- stable # handicapped parking.   PS # DISPOSITION:  # today Luspatecept  # follow up in 4 weeks--MD-  labs CBC/cmp;  LDH; Luspatercept  SQ-  Dr.B       All questions were answered. The patient knows to call the clinic with any problems, questions or concerns.   Cindy JONELLE Joe, MD 05/23/2024 10:23 AM

## 2024-05-23 NOTE — Assessment & Plan Note (Addendum)
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 MARCH 2023-bone marrow biopsy no evidence of obvious progression of MDS or any acute process.  Currently on  Luspatercept .   # proceed with Luspatercept  # 32  today.  Continueh q 4 weeks given the stability/Hemoglobin- is reasonable;- stable.    # Leukocytosis-predominant neutrophilia immature cells noted- peripheral blood flow cytometry APRIL 2025- No significant immunophenotypic abnormality detected Neutrophilia with left-shifted maturation and monocytosis- stable.   # vit D def- FEB 2024-Vitamin D - 25/low-  on Vit D 50-continue vitamin D  weekly. OCT 2024-  Vit D-34-recommend every other week. 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  ? Etiology- poor tolerance to cymablta. S/p evaluation with Dr.Vaslow.  currently waiting on gabapentin  [Dr.Patel]- defer to neurology.   # [facility]- s/p shoulder surgery- s/p MRI for back [[place]]- await results.   # CKD- stage III- 50s- monitor for now-stable  # Vaccination: s/p flu shot. Consider/recommend COVID/ RSV- stable # handicapped parking.   PS # DISPOSITION:  # today Luspatecept  # follow up in 4 weeks--MD-  labs CBC/cmp;  LDH; Luspatercept  SQ-  Dr.B

## 2024-05-24 ENCOUNTER — Other Ambulatory Visit: Payer: Self-pay

## 2024-05-24 DIAGNOSIS — S42202D Unspecified fracture of upper end of left humerus, subsequent encounter for fracture with routine healing: Secondary | ICD-10-CM | POA: Diagnosis not present

## 2024-05-24 DIAGNOSIS — M25612 Stiffness of left shoulder, not elsewhere classified: Secondary | ICD-10-CM | POA: Diagnosis not present

## 2024-05-24 DIAGNOSIS — M6281 Muscle weakness (generalized): Secondary | ICD-10-CM | POA: Diagnosis not present

## 2024-05-29 DIAGNOSIS — M47816 Spondylosis without myelopathy or radiculopathy, lumbar region: Secondary | ICD-10-CM | POA: Diagnosis not present

## 2024-05-29 DIAGNOSIS — S42202D Unspecified fracture of upper end of left humerus, subsequent encounter for fracture with routine healing: Secondary | ICD-10-CM | POA: Diagnosis not present

## 2024-05-29 DIAGNOSIS — M6281 Muscle weakness (generalized): Secondary | ICD-10-CM | POA: Diagnosis not present

## 2024-05-29 DIAGNOSIS — M25612 Stiffness of left shoulder, not elsewhere classified: Secondary | ICD-10-CM | POA: Diagnosis not present

## 2024-05-30 ENCOUNTER — Ambulatory Visit: Payer: PPO

## 2024-05-30 VITALS — Ht 64.0 in | Wt 155.0 lb

## 2024-05-30 DIAGNOSIS — Z0000 Encounter for general adult medical examination without abnormal findings: Secondary | ICD-10-CM | POA: Diagnosis not present

## 2024-05-30 DIAGNOSIS — Z1231 Encounter for screening mammogram for malignant neoplasm of breast: Secondary | ICD-10-CM

## 2024-05-30 NOTE — Patient Instructions (Signed)
 [name] , Thank you for taking time out of your busy schedule to complete your Annual Wellness Visit with me. I enjoyed our conversation and look forward to speaking with you again next year. I, as well as your care team,  appreciate your ongoing commitment to your health goals. Please review the following plan we discussed and let me know if I can assist you in the future. Your Game plan/ To Do List    Referrals: If you haven't heard from the office you've been referred to, please reach out to them at the phone provided.   Follow up Visits: We will see or speak with you next year for your Next Medicare AWV with our clinical staff Have you seen your provider in the last 6 months (3 months if uncontrolled diabetes)? No  Clinician Recommendations:  Aim for 30 minutes of exercise or brisk walking, 6-8 glasses of water, and 5 servings of fruits and vegetables each day.       This is a list of the screenings recommended for you:  Health Maintenance  Topic Date Due   Flu Shot  04/27/2024   COVID-19 Vaccine (3 - Pfizer risk series) 07/28/2024*   Mammogram  07/25/2024   Medicare Annual Wellness Visit  05/30/2025   Colon Cancer Screening  07/30/2025   DTaP/Tdap/Td vaccine (3 - Td or Tdap) 03/06/2034   Pneumococcal Vaccine for age over 81  Completed   DEXA scan (bone density measurement)  Completed   Hepatitis C Screening  Completed   Zoster (Shingles) Vaccine  Completed   HPV Vaccine  Aged Out   Meningitis B Vaccine  Aged Out  *Topic was postponed. The date shown is not the original due date.    Advanced directives: (In Chart) A copy of your advanced directives are scanned into your chart should your provider ever need it. Advance Care Planning is important because it:  [x]  Makes sure you receive the medical care that is consistent with your values, goals, and preferences  [x]  It provides guidance to your family and loved ones and reduces their decisional burden about whether or not they  are making the right decisions based on your wishes.  Follow the link provided in your after visit summary or read over the paperwork we have mailed to you to help you started getting your Advance Directives in place. If you need assistance in completing these, please reach out to us  so that we can help you!

## 2024-05-30 NOTE — Progress Notes (Signed)
 Subjective:   Carla [name] is a 73 y.o. who presents for a Medicare Wellness preventive visit.  As a reminder, Annual Wellness Visits don't include a physical exam, and some assessments Puccini be limited, especially if this visit is performed virtually. We [place] recommend an in-person follow-up visit with your provider if needed.  Visit Complete: Virtual I connected with  Rock SQUIBB Goldinger on 05/30/24 by a audio enabled telemedicine application and verified that I am speaking with the correct person using two identifiers.  Patient Location: Home  Provider Location: Home Office  I discussed the limitations of evaluation and management by telemedicine. The patient expressed understanding and agreed to proceed.  Vital Signs: Because this visit was a virtual/telehealth visit, some criteria Joles be missing or patient reported. Any vitals not documented were not able to be obtained and vitals that have been documented are patient reported.  VideoDeclined- This patient declined Librarian, academic. Therefore the visit was completed with audio only.  Persons Participating in Visit: Patient.  AWV Questionnaire: No: Patient Medicare AWV questionnaire was not completed prior to this visit.  Cardiac Risk Factors include: advanced age (>55men, >76 women);sedentary lifestyle (sedentary due to injury)     Objective:    Today's Vitals   05/30/24 1019 05/30/24 1020  Weight: 155 lb (70.3 kg)   Height: 5' 4 (1.626 m)   PainSc:  4    Body mass index is 26.61 kg/m.     05/30/2024   10:36 AM 05/23/2024    9:11 AM 04/25/2024    3:04 PM 03/28/2024    8:50 AM 03/21/2024    9:21 AM 03/16/2024    1:04 PM 03/05/2024    8:58 PM  Advanced Directives  Does Patient Have a Medical Advance Directive? Yes Yes Yes Yes Yes Yes No;Yes  Type of Estate agent of [place];Living will Healthcare Power of [place];Living will Healthcare Power of [place];Living will Healthcare Power  of [place];Living will Healthcare Power of [place];Living will Healthcare Power of [place];Living will Healthcare Power of [place];Living will  Does patient want to make changes to medical advance directive?      No - Patient declined   Copy of Healthcare Power of Attorney in Chart? Yes - validated most recent copy scanned in chart (See row information) Yes - validated most recent copy scanned in chart (See row information) Yes - validated most recent copy scanned in chart (See row information) Yes - validated most recent copy scanned in chart (See row information)     Would patient like information on creating a medical advance directive?     No - Patient declined  No - Patient declined    Current Medications (verified) Outpatient Encounter Medications as of 05/30/2024  Medication Sig   acetaminophen  (TYLENOL ) 500 MG tablet Take 500 mg by mouth every 6 (six) hours as needed for mild pain (pain score 1-3).   ASPIRIN  LOW DOSE 81 MG chewable tablet Chew 81 mg by mouth 2 (two) times daily.   calcium-vitamin D  (OSCAL WITH D) 500-5 MG-MCG tablet Take 1 tablet by mouth daily with breakfast.   celecoxib  (CELEBREX ) 100 MG capsule Take 100 mg by mouth 2 (two) times daily.   Darbepoetin Alfa  (ARANESP ) 500 MCG/ML SOSY injection Inject 500 mcg into the skin See admin instructions. Inject 500 mcg into the skin every week   levothyroxine  (SYNTHROID ) 25 MCG tablet TAKE ONE TAB BY MOUTH ONCE DAILY. TAKE ON AN EMPTY STOMACH WITH A GLASS OF WATER  ATLEAST 30-60 MINUTES BEFORE BREAKFAST   loratadine (CLARITIN) 10 MG tablet Take 10 mg by mouth at bedtime.   luspatercept -aamt (REBLOZYL ) 75 MG subcutaneous injection Inject into the skin.   methocarbamol (ROBAXIN) 500 MG tablet Take 500 mg by mouth every 8 (eight) hours as needed.   omeprazole  (PRILOSEC) 20 MG capsule Take 20 mg by mouth daily.   promethazine  (PHENERGAN ) 25 MG tablet Take 1 tablet (25 mg total) by mouth every 6 (six) hours as needed for nausea or  vomiting.   traMADol  (ULTRAM ) 50 MG tablet Take 50 mg by mouth every 6 (six) hours as needed.   Vitamin D , Ergocalciferol , (DRISDOL ) 1.25 MG (50000 UNIT) CAPS capsule TAKE 1 CAPSULES BY MOUTH ONCE A WEEK   Facility-Administered Encounter Medications as of 05/30/2024  Medication   heparin  lock flush 100 unit/mL   sodium chloride  flush (NS) 0.9 % injection 10 mL    Allergies (verified) Codeine   History: Past Medical History:  Diagnosis Date   Allergic rhinitis, cause unspecified    Anemia, unspecified    Arthritis    Cancer (HCC)    Carpal tunnel syndrome    Cataract 2007/2010   Cystitis, unspecified    Family history of osteoporosis    Hypothyroidism    PONV (postoperative nausea and vomiting)    Postnasal drip    Syncope and collapse    Past Surgical History:  Procedure Laterality Date   BONE MARROW BIOPSY     CARPAL TUNNEL RELEASE     COLONOSCOPY  09/28/2003   COLONOSCOPY WITH PROPOFOL  N/A 07/31/2015   Procedure: COLONOSCOPY WITH PROPOFOL ;  Surgeon: Lamar Bunk, MD;  Location: [facility];  Service: Endoscopy;  Laterality: N/A;   DIAGNOSTIC LAPAROSCOPY     dx lack of pregnancy    EYE SURGERY     lasik, cataract, prk,yag procedure   REVERSE SHOULDER ARTHROPLASTY Left 03/21/2024   Procedure: ARTHROPLASTY, SHOULDER, TOTAL, REVERSE FOR FRACTURE;  Surgeon: Cristy Bonner DASEN, MD;  Location: [facility];  Service: Orthopedics;  Laterality: Left;   Family History  Problem Relation Age of Onset   Osteoporosis Mother    Stroke Mother    Arthritis Mother    Coronary artery disease Father    Stroke Father 53   Arthritis Father    Heart disease Father    Diabetes Other        Aunts and uncles   Breast cancer Paternal Aunt    Breast cancer Maternal Aunt    Arthritis/Rheumatoid Child    Breast cancer Cousin    Cancer Maternal Aunt    Cancer Maternal Aunt    Cancer Paternal Aunt    Diabetes Paternal Aunt    Diabetes Sister    Diabetes Paternal Uncle    Heart disease Paternal  Uncle    Stroke Paternal Uncle    Diabetes Paternal Aunt    Stroke Paternal Aunt    Social History   Socioeconomic History   Marital status: Married    Spouse name: Not on file   Number of children: Not on file   Years of education: Not on file   Highest education level: Not on file  Occupational History   Not on file  Tobacco Use   Smoking status: Never   Smokeless tobacco: Never  Vaping Use   Vaping status: Never Used  Substance and Sexual Activity   Alcohol use: Not Currently    Comment: Rare   Drug use: No   Sexual activity: Not Currently  Other  Topics Concern   Not on file  Social History Narrative   No regular exercise      Drinks lots of [employer]epsi         Social Drivers of Health   Financial Resource Strain: Low Risk  (05/30/2024)   Overall Financial Resource Strain (CARDIA)    Difficulty of Paying Living Expenses: Not hard at all  Food Insecurity: No Food Insecurity (05/30/2024)   Hunger Vital Sign    Worried About Running Out of Food in the Last Year: Never true    Ran Out of Food in the Last Year: Never true  Transportation Needs: No Transportation Needs (05/30/2024)   PRAPARE - Administrator, Civil Service (Medical): No    Lack of Transportation (Non-Medical): No  Physical Activity: Inactive (05/30/2024)   Exercise Vital Sign    Days of Exercise per Week: 0 days    Minutes of Exercise per Session: 0 min  Stress: No Stress Concern Present (05/30/2024)   [employer] of Occupational Health - Occupational Stress Questionnaire    Feeling of Stress: Not at all  Social Connections: Moderately Integrated (05/30/2024)   Social Connection and Isolation Panel    Frequency of Communication with Friends and Family: More than three times a week    Frequency of Social Gatherings with Friends and Family: More than three times a week    Attends Religious Services: More than 4 times per year    Active Member of [employer] or Organizations: No    Attends Museum/gallery exhibitions officer: Never    Marital Status: Married    Tobacco Counseling Counseling given: Not Answered    Clinical Intake:  Pre-visit preparation completed: Yes  Pain : 0-10 Pain Score: 4  Pain Type: Chronic pain Pain Location: Back Pain Orientation: Lower Pain Descriptors / Indicators: Aching Pain Onset: More than a month ago Pain Frequency: Intermittent Pain Relieving Factors: medications, analgesic creme Effect of Pain on Daily Activities: decreases activity and what pt can do  Pain Relieving Factors: medications, analgesic creme  BMI - recorded: 26.61 Nutritional Status: BMI 25 -29 Overweight Nutritional Risks: None Diabetes: No  Lab Results  Component Value Date   HGBA1C 5.8 07/06/2023   HGBA1C 5.4 04/23/2022   HGBA1C 5.3 04/23/2021     How often do you need to have someone help you when you read instructions, pamphlets, or other written materials from your doctor or pharmacy?: 1 - Never  Interpreter Needed?: No  Comments: lives with husband Information entered by :: B.Ashiah Karpowicz,LPN   Activities of Daily Living     05/30/2024   10:37 AM 03/21/2024    9:30 AM  In your present state of health, do you have any difficulty performing the following activities:  Hearing? 0 0  Vision? 0 0  Difficulty concentrating or making decisions? 1 0  Walking or climbing stairs? 1   Dressing or bathing? 1   Doing errands, shopping? 1   Preparing Food and eating ? Y   Using the Toilet? N   In the past six months, have you accidently leaked urine? Y   Do you have problems with loss of bowel control? N   Managing your Medications? N   Managing your Finances? N   Housekeeping or managing your Housekeeping? Y     Patient Care Team: [name], [name], MD as PCP - General Rennie, Cindy SAUNDERS, MD as Consulting Physician (Oncology) [facility], [facility] (Optometry)  I have updated your Care Teams  any recent Medical Services you [place] have received from other providers  in the past year.     Assessment:   This is a routine wellness examination for [place].  Hearing/Vision screen Hearing Screening - Comments:: Patient denies any hearing difficulties.   Vision Screening - Comments:: Pt says their vision is good with readers only  [facility]   Goals Addressed             This Visit's Progress    COMPLETED: DIET - EAT MORE FRUITS AND VEGETABLES       COMPLETED: Increase physical activity       When schedule permits, I will continue to exercise for at least 60 minutes 3 days per week.      COMPLETED: Patient Stated       03/06/2020, I will maintain and continue medications as prescribed.      Patient Stated       05/30/24-Control HGB     Patient Stated       I would like to control back pain and get back to normal       Depression Screen     05/30/2024   10:33 AM 05/23/2024    9:31 AM 04/25/2024    3:06 PM 05/23/2023    9:22 AM 05/20/2022    9:30 AM 05/07/2022   10:59 AM 04/29/2022   11:15 AM  PHQ 2/9 Scores  PHQ - 2 Score 0 0 0 0 0 0 0  PHQ- 9 Score     0      Fall Risk     05/30/2024   10:27 AM 05/23/2023    9:26 AM 05/20/2022    9:32 AM 04/29/2022   11:15 AM 04/27/2021   10:33 AM  Fall Risk   Falls in the past year? 1 0 0 0 0  Number falls in past yr: 0 0 0    Injury with Fall? 1 0 0    Risk for fall due to : No Fall Risks No Fall Risks No Fall Risks    Follow up Education provided;Falls prevention discussed Falls prevention discussed;Falls evaluation completed Falls evaluation completed  Falls evaluation completed       Data saved with a previous flowsheet row definition    MEDICARE RISK AT HOME:  Medicare Risk at Home Any stairs in or around the home?: Yes If so, are there any without handrails?: Yes Home free of loose throw rugs in walkways, pet beds, electrical cords, etc?: Yes Adequate lighting in your home to reduce risk of falls?: Yes Life alert?: No Use of a cane, walker or w/c?: No Grab bars in the bathroom?: Yes Shower  chair or bench in shower?: Yes Elevated toilet seat or a handicapped toilet?: Yes  TIMED UP AND GO:  Was the test performed?  No  Cognitive Function: 6CIT completed    03/06/2020    2:55 PM 03/02/2019   12:30 PM 12/20/2017    9:17 AM  MMSE - Mini Mental State Exam  Orientation to time 5 5 5   Orientation to Place 5 5 5   Registration 3 3 3   Attention/ Calculation 5 0 0  Recall 3 3 3   Language- name 2 objects  0 0  Language- repeat 1 1 1   Language- follow 3 step command  0 3  Language- read & follow direction  0 0  Write a sentence  0 0  Copy design  0 0  Total score  17 20  05/30/2024   10:39 AM 05/23/2023    9:29 AM 05/20/2022    9:34 AM  6CIT Screen  What Year? 0 points 0 points 0 points  What month? 0 points 0 points 0 points  What time? 0 points 0 points 0 points  Count back from 20 0 points 0 points 0 points  Months in reverse 0 points 0 points 0 points  Repeat phrase 0 points 0 points 0 points  Total Score 0 points 0 points 0 points    Immunizations Immunization History  Administered Date(s) Administered   Fluad Quad(high Dose 65+) 07/05/2019, 06/24/2020, 07/07/2021   Fluad Trivalent(High Dose 65+) 07/13/2023   INFLUENZA, HIGH DOSE SEASONAL PF 07/13/2015, 06/27/2017   Influenza Split 06/22/2011   Influenza Whole 06/26/2008, 07/15/2009, 08/17/2012   Influenza,inj,Quad PF,6+ Mos 07/18/2013   Influenza-Unspecified 07/11/2014, 07/09/2016   PFIZER(Purple Top)SARS-COV-2 Vaccination 11/03/2019, 11/24/2019   Pneumococcal Conjugate-13 10/10/2015   Pneumococcal Polysaccharide-23 12/14/2016   Tdap 06/22/2011, 03/06/2024   Zoster Recombinant(Shingrix) 07/31/2019, 12/19/2019   Zoster, Live 07/05/2012    Screening Tests Health Maintenance  Topic Date Due   INFLUENZA VACCINE  04/27/2024   COVID-19 Vaccine (3 - Pfizer risk series) 07/28/2024 (Originally 12/22/2019)   MAMMOGRAM  07/25/2024   Medicare Annual Wellness (AWV)  05/30/2025   Colonoscopy  07/30/2025    DTaP/Tdap/Td (3 - Td or Tdap) 03/06/2034   Pneumococcal Vaccine: 50+ Years  Completed   DEXA SCAN  Completed   Hepatitis C Screening  Completed   Zoster Vaccines- Shingrix  Completed   HPV VACCINES  Aged Out   Meningococcal B Vaccine  Aged Out    Health Maintenance  Health Maintenance Due  Topic Date Due   INFLUENZA VACCINE  04/27/2024   Health Maintenance Items Addressed: None due at this time. Pt will receive vaccines at their pharmacy when decided to obtain   Additional Screening:  Vision Screening: Recommended annual ophthalmology exams for early detection of glaucoma and other disorders of the eye. Would you like a referral to an eye doctor? No    Dental Screening: Recommended annual dental exams for proper oral hygiene  Community Resource Referral / Chronic Care Management: CRR required this visit?  No   CCM required this visit?  Appt scheduled with PCP   Plan:    I have personally reviewed and noted the following in the patient's chart:   Medical and social history Use of alcohol, tobacco or illicit drugs  Current medications and supplements including opioid prescriptions. Patient is currently taking opioid prescriptions. Information provided to patient regarding non-opioid alternatives. Patient advised to discuss non-opioid treatment plan with their provider. Functional ability and status Nutritional status Physical activity Advanced directives List of other physicians Hospitalizations, surgeries, and ER visits in previous 12 months Vitals Screenings to include cognitive, depression, and falls Referrals and appointments  In addition, I have reviewed and discussed with patient certain preventive protocols, quality metrics, and best practice recommendations. A written personalized care plan for preventive services as well as general preventive health recommendations were provided to patient.   Erminio LITTIE Saris, LPN   [id number]   After Visit Summary:  (Declined) Due to this being a telephonic visit, with patients personalized plan was offered to patient but patient Declined AVS at this time   Notes: Nothing significant to report at this time.

## 2024-05-31 ENCOUNTER — Other Ambulatory Visit: Payer: Self-pay

## 2024-05-31 DIAGNOSIS — M6281 Muscle weakness (generalized): Secondary | ICD-10-CM | POA: Diagnosis not present

## 2024-05-31 DIAGNOSIS — S42202D Unspecified fracture of upper end of left humerus, subsequent encounter for fracture with routine healing: Secondary | ICD-10-CM | POA: Diagnosis not present

## 2024-05-31 DIAGNOSIS — M25612 Stiffness of left shoulder, not elsewhere classified: Secondary | ICD-10-CM | POA: Diagnosis not present

## 2024-06-05 DIAGNOSIS — M25612 Stiffness of left shoulder, not elsewhere classified: Secondary | ICD-10-CM | POA: Diagnosis not present

## 2024-06-05 DIAGNOSIS — S42202D Unspecified fracture of upper end of left humerus, subsequent encounter for fracture with routine healing: Secondary | ICD-10-CM | POA: Diagnosis not present

## 2024-06-05 DIAGNOSIS — M6281 Muscle weakness (generalized): Secondary | ICD-10-CM | POA: Diagnosis not present

## 2024-06-05 DIAGNOSIS — M47816 Spondylosis without myelopathy or radiculopathy, lumbar region: Secondary | ICD-10-CM | POA: Diagnosis not present

## 2024-06-06 ENCOUNTER — Other Ambulatory Visit: Payer: Self-pay | Admitting: Internal Medicine

## 2024-06-06 DIAGNOSIS — D46Z Other myelodysplastic syndromes: Secondary | ICD-10-CM

## 2024-06-07 DIAGNOSIS — M25612 Stiffness of left shoulder, not elsewhere classified: Secondary | ICD-10-CM | POA: Diagnosis not present

## 2024-06-07 DIAGNOSIS — S42202D Unspecified fracture of upper end of left humerus, subsequent encounter for fracture with routine healing: Secondary | ICD-10-CM | POA: Diagnosis not present

## 2024-06-07 DIAGNOSIS — M6281 Muscle weakness (generalized): Secondary | ICD-10-CM | POA: Diagnosis not present

## 2024-06-19 ENCOUNTER — Encounter: Payer: Self-pay | Admitting: Internal Medicine

## 2024-06-19 DIAGNOSIS — S42202D Unspecified fracture of upper end of left humerus, subsequent encounter for fracture with routine healing: Secondary | ICD-10-CM | POA: Diagnosis not present

## 2024-06-20 ENCOUNTER — Inpatient Hospital Stay

## 2024-06-20 ENCOUNTER — Other Ambulatory Visit

## 2024-06-20 ENCOUNTER — Encounter: Payer: Self-pay | Admitting: Internal Medicine

## 2024-06-20 ENCOUNTER — Ambulatory Visit

## 2024-06-20 ENCOUNTER — Inpatient Hospital Stay (HOSPITAL_BASED_OUTPATIENT_CLINIC_OR_DEPARTMENT_OTHER): Admitting: Internal Medicine

## 2024-06-20 ENCOUNTER — Inpatient Hospital Stay: Attending: Internal Medicine

## 2024-06-20 ENCOUNTER — Ambulatory Visit: Admitting: Internal Medicine

## 2024-06-20 VITALS — BP 132/61 | HR 72 | Temp 97.4°F | Resp 16 | Ht 64.0 in | Wt 153.6 lb

## 2024-06-20 DIAGNOSIS — N183 Chronic kidney disease, stage 3 unspecified: Secondary | ICD-10-CM | POA: Insufficient documentation

## 2024-06-20 DIAGNOSIS — D46Z Other myelodysplastic syndromes: Secondary | ICD-10-CM

## 2024-06-20 DIAGNOSIS — D461 Refractory anemia with ring sideroblasts: Secondary | ICD-10-CM | POA: Insufficient documentation

## 2024-06-20 DIAGNOSIS — D72829 Elevated white blood cell count, unspecified: Secondary | ICD-10-CM | POA: Insufficient documentation

## 2024-06-20 DIAGNOSIS — E559 Vitamin D deficiency, unspecified: Secondary | ICD-10-CM | POA: Insufficient documentation

## 2024-06-20 LAB — CMP (CANCER CENTER ONLY)
ALT: 19 U/L (ref 0–44)
AST: 19 U/L (ref 15–41)
Albumin: 4.3 g/dL (ref 3.5–5.0)
Alkaline Phosphatase: 77 U/L (ref 38–126)
Anion gap: 9 (ref 5–15)
BUN: 41 mg/dL — ABNORMAL HIGH (ref 8–23)
CO2: 22 mmol/L (ref 22–32)
Calcium: 9.1 mg/dL (ref 8.9–10.3)
Chloride: 102 mmol/L (ref 98–111)
Creatinine: 1.22 mg/dL — ABNORMAL HIGH (ref 0.44–1.00)
GFR, Estimated: 47 mL/min — ABNORMAL LOW (ref >60)
Glucose, Bld: 119 mg/dL — ABNORMAL HIGH (ref 70–99)
Potassium: 4.3 mmol/L (ref 3.5–5.1)
Sodium: 133 mmol/L — ABNORMAL LOW (ref 135–145)
Total Bilirubin: 0.9 mg/dL (ref 0.0–1.2)
Total Protein: 8.4 g/dL — ABNORMAL HIGH (ref 6.5–8.1)

## 2024-06-20 LAB — CBC WITH DIFFERENTIAL (CANCER CENTER ONLY)
Abs Immature Granulocytes: 1.33 K/uL — ABNORMAL HIGH (ref 0.00–0.07)
Basophils Absolute: 0.2 K/uL — ABNORMAL HIGH (ref 0.0–0.1)
Basophils Relative: 1 %
Eosinophils Absolute: 0 K/uL (ref 0.0–0.5)
Eosinophils Relative: 0 %
HCT: 34.5 % — ABNORMAL LOW (ref 36.0–46.0)
Hemoglobin: 10.8 g/dL — ABNORMAL LOW (ref 12.0–15.0)
Immature Granulocytes: 6 %
Lymphocytes Relative: 14 %
Lymphs Abs: 3 K/uL (ref 0.7–4.0)
MCH: 30.1 pg (ref 26.0–34.0)
MCHC: 31.3 g/dL (ref 30.0–36.0)
MCV: 96.1 fL (ref 80.0–100.0)
Monocytes Absolute: 2.6 K/uL — ABNORMAL HIGH (ref 0.1–1.0)
Monocytes Relative: 13 %
Neutro Abs: 13.6 K/uL — ABNORMAL HIGH (ref 1.7–7.7)
Neutrophils Relative %: 66 %
Platelet Count: 296 K/uL (ref 150–400)
RBC: 3.59 MIL/uL — ABNORMAL LOW (ref 3.87–5.11)
RDW: 28.2 % — ABNORMAL HIGH (ref 11.5–15.5)
WBC Count: 20.7 K/uL — ABNORMAL HIGH (ref 4.0–10.5)
nRBC: 0.6 % — ABNORMAL HIGH (ref 0.0–0.2)

## 2024-06-20 LAB — LACTATE DEHYDROGENASE: LDH: 143 U/L (ref 98–192)

## 2024-06-20 MED ORDER — LUSPATERCEPT-AAMT 75 MG ~~LOC~~ SOLR
75.0000 mg | Freq: Once | SUBCUTANEOUS | Status: AC
  Administered 2024-06-20: 75 mg via SUBCUTANEOUS
  Filled 2024-06-20: qty 1.5

## 2024-06-20 NOTE — Assessment & Plan Note (Addendum)
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 MARCH 2023-bone marrow biopsy no evidence of obvious progression of MDS or any acute process.  Currently on  Luspatercept .   # proceed with Luspatercept  # 33  today.  Continueh q 4 weeks given the stability/Hemoglobin- is reasonable;- stable.    # Leukocytosis-predominant neutrophilia immature cells noted- peripheral blood flow cytometry APRIL 2025- No significant immunophenotypic abnormality detected Neutrophilia with left-shifted maturation and monocytosis- stable. [? Related recent steroid injection]; recently been on prednisone - will repeat peripheral flowcytometry  # vit D def- FEB 2024-Vitamin D - 25/low-  on Vit D 50-continue vitamin D  weekly. OCT 2024-  Vit D-34-recommend every other week. 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  ? Etiology- poor tolerance to cymablta. S/p evaluation with Dr.Vaslow.  currently waiting on gabapentin  [Dr.Patel]- defer to neurology.  stable   # [facility]- - s/p MRI for back [[place]]- sec to arthritis-s/p steroid injection- defer to ortho  # CKD- stage III- 50s- monitor for now-stable  # Vaccination: s/p flu shot. Consider/recommend COVID/ RSV- stable  PS # DISPOSITION:  # today Luspatecept  # follow up in 4 weeks--MD-  labs CBC/cmp;  LDH; Luspatercept  SQ- #  follow up in 8  weeks--MD-  labs CBC/cmp;   LDH; Luspatercept  SQ-Dr.B

## 2024-06-20 NOTE — Progress Notes (Signed)
 [name] CONSULT NOTE  Patient Care Team: [name], [name], MD as PCP - General Rennie, Cindy SAUNDERS, MD as Consulting Physician (Oncology) Pa, [facility] (Optometry)  CHIEF COMPLAINTS/PURPOSE OF CONSULTATION: MDS   Oncology History Overview Note   # July 2020-myelodysplastic syndrome with ringed sideroblasts-Normal karyotype; no blasts [IPSS R-Very low risk ~median survival 8.3 years]; iron studies B12 folic acid  myeloma panel normal; pyridoxine levels/copper /zinc -WNL. Erythropoietin  levels-60. II OPINION at [facility] [Dr.DeCastro]  # [facility]/ NGS: TET2([id number])c.2524delT(p.Ser842GlnfsTer31) Exon 3 frame-shift SF3B1([id number])c.2098A>G(p.Lys700Glu) Exon 15 missense  DNMT3A([id number])c.2204A>G(p.Tyr735Cys) Exon 19 missense   # JAN 11th 2020- Aranesp /retacrit;  # July 30th, 2021- START REVLIMID  10 mg/day  [SF3B52mutation]  MARCH 2023- BONE MARROW, ASPIRATE, CLOT, CORE:  -  Hypercellular marrow involved by myelodysplastic syndrome with ring  sideroblasts (MDS-RS)  -  Polytypic plasmacytosis   # MARCh 2023-increase the frequency of Aranesp  400 mcg every 2 weeks; continue Revlimid .   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  Ambulating independently.  Carla [name] 73 y.o.  female history of low-grade MDS anemia with ring sideroblasts  currently on Luspatercept  is here for follow-up.    C/o back/leg pain, seeing Beverley Economy, pain 7/10. She had 2 injections, one for pain and one for inflammation.  S/p recent prednisone .   Patient continues to have occasional SOB; otherwise no worsening cough or leg swelling. Appetite is fairly good. Still taking Calcium+D with the prescription Vitamin D .   Review of Systems  Constitutional:  Positive for malaise/fatigue. Negative for chills, diaphoresis and fever.  HENT:  Negative for nosebleeds and sore throat.   Eyes:  Negative for double vision.  Respiratory:  Negative for cough, hemoptysis, sputum production, shortness of breath and wheezing.   Cardiovascular:  Negative for chest pain and orthopnea.  Gastrointestinal:  Negative for abdominal pain, blood in stool, constipation, diarrhea, heartburn, melena and vomiting.  Genitourinary:  Negative for dysuria, frequency and urgency.  Musculoskeletal:  Positive for back pain and joint pain.  Skin: Negative.  Negative for itching and rash.  Neurological:  Negative for dizziness, tingling, focal weakness, weakness and headaches.  Endo/Heme/Allergies:  Does not bruise/bleed easily.  Psychiatric/Behavioral:  Negative for depression. The patient is not nervous/anxious and does not have insomnia.     MEDICAL HISTORY:  Past Medical History:  Diagnosis Date   Allergic rhinitis, cause unspecified    Anemia, unspecified    Arthritis    Cancer (HCC)    Carpal tunnel syndrome    Cataract 2007/2010   Cystitis, unspecified    Family history of osteoporosis    Hypothyroidism    PONV (postoperative nausea and vomiting)    Postnasal drip    Syncope and collapse     SURGICAL HISTORY: Past Surgical History:  Procedure Laterality Date   BONE MARROW BIOPSY     CARPAL TUNNEL RELEASE  COLONOSCOPY  09/28/2003   COLONOSCOPY WITH PROPOFOL  N/A 07/31/2015   Procedure: COLONOSCOPY WITH PROPOFOL ;  Surgeon: Lamar Bunk, MD;  Location: [facility]  ENDOSCOPY;  Service: Endoscopy;  Laterality: N/A;   DIAGNOSTIC LAPAROSCOPY     dx lack of pregnancy    EYE SURGERY     lasik, cataract, prk,yag procedure   REVERSE SHOULDER ARTHROPLASTY Left 03/21/2024   Procedure: ARTHROPLASTY, SHOULDER, TOTAL, REVERSE FOR FRACTURE;  Surgeon: Cristy Bonner DASEN, MD;  Location: [facility] ORS;  Service: Orthopedics;  Laterality: Left;    SOCIAL HISTORY: Social History   Socioeconomic History   Marital status: Married    Spouse name: Not on file   Number of children: Not on file   Years of education: Not on file   Highest education level: Not on file  Occupational History   Not on file  Tobacco Use   Smoking status: Never   Smokeless tobacco: Never  Vaping Use   Vaping status: Never Used  Substance and Sexual Activity   Alcohol use: Not Currently    Comment: Rare   Drug use: No   Sexual activity: Not Currently  Other Topics Concern   Not on file  Social History Narrative   No regular exercise      Drinks lots of [employer]         Social Drivers of Health   Financial Resource Strain: Low Risk  (05/30/2024)   Overall Financial Resource Strain (CARDIA)    Difficulty of Paying Living Expenses: Not hard at all  Food Insecurity: No Food Insecurity (05/30/2024)   Hunger Vital Sign    Worried About Running Out of Food in the Last Year: Never true    Ran Out of Food in the Last Year: Never true  Transportation Needs: No Transportation Needs (05/30/2024)   PRAPARE - Administrator, Civil Service (Medical): No    Lack of Transportation (Non-Medical): No  Physical Activity: Inactive (05/30/2024)   Exercise Vital Sign    Days of Exercise per Week: 0 days    Minutes of Exercise per Session: 0 min  Stress: No Stress Concern Present (05/30/2024)   [employer] of Occupational Health - Occupational Stress Questionnaire    Feeling of Stress: Not at all  Social Connections: Moderately Integrated (05/30/2024)   Social Connection and Isolation Panel     Frequency of Communication with Friends and Family: More than three times a week    Frequency of Social Gatherings with Friends and Family: More than three times a week    Attends Religious Services: More than 4 times per year    Active Member of [employer] or Organizations: No    Attends Banker Meetings: Never    Marital Status: Married  Catering manager Violence: Not At Risk (05/30/2024)   Humiliation, Afraid, Rape, and Kick questionnaire    Fear of Current or Ex-Partner: No    Emotionally Abused: No    Physically Abused: No    Sexually Abused: No    FAMILY HISTORY: Family History  Problem Relation Age of Onset   Osteoporosis Mother    Stroke Mother    Arthritis Mother    Coronary artery disease Father    Stroke Father 68   Arthritis Father    Heart disease Father    Diabetes Other        Aunts and uncles   Breast cancer Paternal Aunt    Breast cancer Maternal Aunt    Arthritis/Rheumatoid Child    Breast  cancer Cousin    Cancer Maternal Aunt    Cancer Maternal Aunt    Cancer Paternal Aunt    Diabetes Paternal Aunt    Diabetes Sister    Diabetes Paternal Uncle    Heart disease Paternal Uncle    Stroke Paternal Uncle    Diabetes Paternal Aunt    Stroke Paternal Aunt     ALLERGIES:  is allergic to codeine.  MEDICATIONS:  Current Outpatient Medications  Medication Sig Dispense Refill   acetaminophen  (TYLENOL ) 500 MG tablet Take 500 mg by mouth every 6 (six) hours as needed for mild pain (pain score 1-3). (Patient taking differently: Take 1,000 mg by mouth in the morning, at noon, and at bedtime.)     ASPIRIN  LOW DOSE 81 MG chewable tablet Chew 81 mg by mouth 2 (two) times daily.     calcium-vitamin D  (OSCAL WITH D) 500-5 MG-MCG tablet Take 1 tablet by mouth daily with breakfast.     celecoxib  (CELEBREX ) 100 MG capsule Take 100 mg by mouth 2 (two) times daily.     gabapentin  (NEURONTIN ) 300 MG capsule Take 300 mg by mouth 3 (three) times daily.      levothyroxine  (SYNTHROID ) 25 MCG tablet TAKE ONE TAB BY MOUTH ONCE DAILY. TAKE ON AN EMPTY STOMACH WITH A GLASS OF WATER ATLEAST 30-60 MINUTES BEFORE BREAKFAST 90 tablet 3   loratadine (CLARITIN) 10 MG tablet Take 10 mg by mouth at bedtime.     luspatercept -aamt (REBLOZYL ) 75 MG subcutaneous injection Inject into the skin.     methocarbamol (ROBAXIN) 500 MG tablet Take 500 mg by mouth every 8 (eight) hours as needed.     omeprazole  (PRILOSEC) 20 MG capsule Take 20 mg by mouth daily.     promethazine  (PHENERGAN ) 25 MG tablet Take 1 tablet (25 mg total) by mouth every 6 (six) hours as needed for nausea or vomiting. 10 tablet 1   Darbepoetin Alfa  (ARANESP ) 500 MCG/ML SOSY injection Inject 500 mcg into the skin See admin instructions. Inject 500 mcg into the skin every week     Vitamin D , Ergocalciferol , (DRISDOL ) 1.25 MG (50000 UNIT) CAPS capsule TAKE 1 CAPSULES BY MOUTH ONCE A WEEK 12 capsule 1   No current facility-administered medications for this visit.   Facility-Administered Medications Ordered in Other Visits  Medication Dose Route Frequency Provider Last Rate Last Admin   heparin  lock flush 100 unit/mL  500 Units Intravenous Once Camrin Lapre R, MD       luspatercept -aamt (REBLOZYL ) subcutaneous injection 75 mg  75 mg Subcutaneous Once Marquee Fuchs R, MD       sodium chloride  flush (NS) 0.9 % injection 10 mL  10 mL Intravenous PRN Tonyetta Berko R, MD          PHYSICAL EXAMINATION:   Vitals:   06/20/24 1302  BP: 132/61  Pulse: 72  Resp: 16  Temp: (!) 97.4 F (36.3 C)  SpO2: 97%         Filed Weights   06/20/24 1302  Weight: 153 lb 9.6 oz (69.7 kg)          Physical Exam HENT:     Head: Normocephalic and atraumatic.     Mouth/Throat:     Pharynx: No oropharyngeal exudate.  Eyes:     Pupils: Pupils are equal, round, and reactive to light.  Cardiovascular:     Rate and Rhythm: Normal rate and regular rhythm.     Pulses: Normal pulses.      Heart  sounds: Normal heart sounds.  Pulmonary:     Effort: Pulmonary effort is normal. No respiratory distress.     Breath sounds: Normal breath sounds. No wheezing.  Abdominal:     General: Bowel sounds are normal. There is no distension.     Palpations: Abdomen is soft. There is no mass.     Tenderness: There is no abdominal tenderness. There is no guarding or rebound.  Musculoskeletal:        General: No tenderness. Normal range of motion.     Cervical back: Normal range of motion and neck supple.  Skin:    General: Skin is warm.  Neurological:     Mental Status: She is alert and oriented to person, place, and time.  Psychiatric:        Mood and Affect: Affect normal.     LABORATORY DATA:  I have reviewed the data as listed Lab Results  Component Value Date   WBC 20.7 (H) 06/20/2024   HGB 10.8 (L) 06/20/2024   HCT 34.5 (L) 06/20/2024   MCV 96.1 06/20/2024   PLT 296 06/20/2024   Recent Labs    04/25/24 1444 05/23/24 0914 06/20/24 1245  NA 135 135 133*  K 4.1 4.2 4.3  CL 106 107 102  CO2 21* 19* 22  GLUCOSE 127* 133* 119*  BUN 26* 32* 41*  CREATININE 0.92 1.09* 1.22*  CALCIUM 9.4 9.1 9.1  GFRNONAA >60 54* 47*  PROT 8.9* 7.7 8.4*  ALBUMIN 4.5 4.1 4.3  AST 20 18 19   ALT 13 14 19   ALKPHOS 64 68 77  BILITOT 0.7 0.8 0.9     No results found.    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 POSITIVE  For SF3B1; R-IPSS-low risk. MARCH 2023-bone marrow biopsy no evidence of obvious progression of MDS or any acute process.  Currently on  Luspatercept .   # proceed with Luspatercept  # 33  today.  Continueh q 4 weeks given the stability/Hemoglobin- is reasonable;- stable.    # Leukocytosis-predominant neutrophilia immature cells noted- peripheral blood flow cytometry APRIL 2025- No significant immunophenotypic abnormality detected Neutrophilia with left-shifted maturation and monocytosis- stable. [? Related  recent steroid injection]; recently been on prednisone - will repeat peripheral flowcytometry  # vit D def- FEB 2024-Vitamin D - 25/low-  on Vit D 50-continue vitamin D  weekly. OCT 2024-  Vit D-34-recommend every other week. 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  ? Etiology- poor tolerance to cymablta. S/p evaluation with Dr.Vaslow.  currently waiting on gabapentin  [Dr.Patel]- defer to neurology.  stable   # [facility]- - s/p MRI for back [[place]]- sec to arthritis-s/p steroid injection- defer to ortho  # CKD- stage III- 50s- monitor for now-stable  # Vaccination: s/p flu shot. Consider/recommend COVID/ RSV- stable  PS # DISPOSITION:  # today Luspatecept  # follow up in 4 weeks--MD-  labs CBC/cmp;  LDH; Luspatercept  SQ- #  follow up in 8  weeks--MD-  labs CBC/cmp;   LDH; Luspatercept  SQ-Dr.B       All questions were answered. The patient knows to call the clinic with any problems, questions or concerns.   Cindy JONELLE Joe, MD 06/20/2024 1:59 PM

## 2024-06-20 NOTE — Progress Notes (Signed)
 C/o back/leg pain, seeing Beverley Economy, pain 7/10. She had 2 injections, one for pain and one for inflammation.

## 2024-06-22 DIAGNOSIS — M47816 Spondylosis without myelopathy or radiculopathy, lumbar region: Secondary | ICD-10-CM | POA: Diagnosis not present

## 2024-06-22 DIAGNOSIS — Z6825 Body mass index (BMI) 25.0-25.9, adult: Secondary | ICD-10-CM | POA: Diagnosis not present

## 2024-06-22 DIAGNOSIS — M4316 Spondylolisthesis, lumbar region: Secondary | ICD-10-CM | POA: Diagnosis not present

## 2024-06-22 DIAGNOSIS — M48062 Spinal stenosis, lumbar region with neurogenic claudication: Secondary | ICD-10-CM | POA: Diagnosis not present

## 2024-07-03 DIAGNOSIS — M4316 Spondylolisthesis, lumbar region: Secondary | ICD-10-CM | POA: Diagnosis not present

## 2024-07-05 DIAGNOSIS — M4316 Spondylolisthesis, lumbar region: Secondary | ICD-10-CM | POA: Diagnosis not present

## 2024-07-09 DIAGNOSIS — M4316 Spondylolisthesis, lumbar region: Secondary | ICD-10-CM | POA: Diagnosis not present

## 2024-07-11 ENCOUNTER — Ambulatory Visit (INDEPENDENT_AMBULATORY_CARE_PROVIDER_SITE_OTHER)

## 2024-07-11 DIAGNOSIS — Z23 Encounter for immunization: Secondary | ICD-10-CM | POA: Diagnosis not present

## 2024-07-11 DIAGNOSIS — M4316 Spondylolisthesis, lumbar region: Secondary | ICD-10-CM | POA: Diagnosis not present

## 2024-07-17 DIAGNOSIS — Z6825 Body mass index (BMI) 25.0-25.9, adult: Secondary | ICD-10-CM | POA: Diagnosis not present

## 2024-07-17 DIAGNOSIS — M4316 Spondylolisthesis, lumbar region: Secondary | ICD-10-CM | POA: Diagnosis not present

## 2024-07-18 ENCOUNTER — Other Ambulatory Visit

## 2024-07-18 ENCOUNTER — Ambulatory Visit: Admitting: Internal Medicine

## 2024-07-18 ENCOUNTER — Ambulatory Visit

## 2024-07-18 NOTE — Progress Notes (Unsigned)
 [facility]  PROGRESS NOTE  Patient Care Team: [name], [name], MD as PCP - General Rennie, Cindy SAUNDERS, MD as Consulting Physician (Oncology) Pa, [facility] (Optometry)  CHIEF COMPLAINTS/PURPOSE OF CONSULTATION:  MDS  Oncology History Overview Note   # July 2020-myelodysplastic syndrome with ringed sideroblasts-Normal karyotype; no blasts [IPSS R-Very low risk ~median survival 8.3 years]; iron studies B12 folic acid  myeloma panel normal; pyridoxine levels/copper /zinc -WNL. Erythropoietin  levels-60. II OPINION at [facility] [Dr.DeCastro]  # [facility]/ NGS: TET2([id number])c.2524delT(p.Ser842GlnfsTer31) Exon 3 frame-shift SF3B1([id number])c.2098A>G(p.Lys700Glu) Exon 15 missense  DNMT3A([id number])c.2204A>G(p.Tyr735Cys) Exon 19 missense   # JAN 11th 2020- Aranesp /retacrit;  # July 30th, 2021- START REVLIMID  10 mg/day  [SF3B35mutation]  MARCH 2023- BONE MARROW, ASPIRATE, CLOT, CORE:  -  Hypercellular marrow involved by myelodysplastic syndrome with ring  sideroblasts (MDS-RS)  -  Polytypic plasmacytosis   # MARCh 2023-increase the frequency of Aranesp  400 mcg every 2 weeks; continue Revlimid .   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 ; September 2021 ejection fraction 60 to 65%;   # colonoscopy- 2016 [Dr.Bucinni];  DIAGNOSIS: MDS/low-grade with ringed sideroblasts  STAGE: Low       ;  GOALS: Control     MDS (myelodysplastic syndrome), low grade (HCC)  04/23/2019 Initial Diagnosis   MDS (myelodysplastic syndrome), low grade (HCC)   05/11/2022 - 05/11/2022 Chemotherapy   Patient is on Treatment Plan : MYELODYSPLASIA Luspatercept  q21d     05/11/2022 -  Chemotherapy   Patient is on Treatment Plan : MYELODYSPLASIA Luspatercept  q21d      CURRENT TREATMENT: Luspatercept   INTERVAL HISTORY:  Carla [name] 73 y.o. female returns for a follow up for continued management of low grade MDS while on  Luspatercept .   On exam today, Carla [name] reports having ongoing fatigue which has worsened due to back pain after recent fall.  She reports having more stiffness in her back in the morning.  She has tried muscle relaxants, physical therapy and over-the-counter analgesics with minimal relief.  She is in discussions with her neurosurgeon to undergo lumbar fusion as recent MRI showed severe stenosis.  She reports her appetite and weight are stable.  She denies nausea, vomiting or bowel habit changes.  She denies easy bruising or signs of active bleeding.  She denies fevers, chills, night sweats, shortness of breath, chest pain or cough.  She has no other complaints.  Rest of the 10 point ROS as below.  MEDICAL HISTORY:  Past Medical History:  Diagnosis Date   Allergic rhinitis, cause unspecified    Anemia, unspecified    Arthritis    Cancer (HCC)    Carpal tunnel syndrome    Cataract 2007/2010   Cystitis, unspecified    Family history of osteoporosis    Hypothyroidism    PONV (postoperative nausea and vomiting)    Postnasal drip    Syncope and collapse     SURGICAL HISTORY: Past Surgical History:  Procedure Laterality Date   BONE MARROW BIOPSY     CARPAL TUNNEL RELEASE     COLONOSCOPY  09/28/2003   COLONOSCOPY WITH PROPOFOL  N/A 07/31/2015   Procedure: COLONOSCOPY WITH PROPOFOL ;  Surgeon: Lamar Bunk, MD;  Location: [facility];  Service: Endoscopy;  Laterality: N/A;   DIAGNOSTIC LAPAROSCOPY     dx lack of pregnancy    EYE SURGERY     lasik, cataract, prk,yag procedure   REVERSE SHOULDER ARTHROPLASTY Left 03/21/2024   Procedure: ARTHROPLASTY, SHOULDER, TOTAL, REVERSE FOR FRACTURE;  Surgeon: Cristy Bonner DASEN, MD;  Location: [facility];  Service: Orthopedics;  Laterality: Left;    SOCIAL HISTORY: Social History   Socioeconomic History   Marital status: Married    Spouse name: Not on file   Number of children: Not on file   Years of education: Not on file   Highest education level:  Not on file  Occupational History   Not on file  Tobacco Use   Smoking status: Never   Smokeless tobacco: Never  Vaping Use   Vaping status: Never Used  Substance and Sexual Activity   Alcohol use: Not Currently    Comment: Rare   Drug use: No   Sexual activity: Not Currently  Other Topics Concern   Not on file  Social History Narrative   No regular exercise      Drinks lots of [employer]         Social Drivers of Health   Financial Resource Strain: Low Risk  (05/30/2024)   Overall Financial Resource Strain (CARDIA)    Difficulty of Paying Living Expenses: Not hard at all  Food Insecurity: No Food Insecurity (05/30/2024)   Hunger Vital Sign    Worried About Running Out of Food in the Last Year: Never true    Ran Out of Food in the Last Year: Never true  Transportation Needs: No Transportation Needs (05/30/2024)   PRAPARE - Administrator, Civil Service (Medical): No    Lack of Transportation (Non-Medical): No  Physical Activity: Inactive (05/30/2024)   Exercise Vital Sign    Days of Exercise per Week: 0 days    Minutes of Exercise per Session: 0 min  Stress: No Stress Concern Present (05/30/2024)   [employer] of Occupational Health - Occupational Stress Questionnaire    Feeling of Stress: Not at all  Social Connections: Moderately Integrated (05/30/2024)   Social Connection and Isolation Panel    Frequency of Communication with Friends and Family: More than three times a week    Frequency of Social Gatherings with Friends and Family: More than three times a week    Attends Religious Services: More than 4 times per year    Active Member of [employer] or Organizations: No    Attends Banker Meetings: Never    Marital Status: Married  Catering manager Violence: Not At Risk (05/30/2024)   Humiliation, Afraid, Rape, and Kick questionnaire    Fear of Current or Ex-Partner: No    Emotionally Abused: No    Physically Abused: No    Sexually Abused: No    FAMILY  HISTORY: Family History  Problem Relation Age of Onset   Osteoporosis Mother    Stroke Mother    Arthritis Mother    Coronary artery disease Father    Stroke Father 51   Arthritis Father    Heart disease Father    Diabetes Other        Aunts and uncles   Breast cancer Paternal Aunt    Breast cancer Maternal Aunt    Arthritis/Rheumatoid Child    Breast cancer Cousin    Cancer Maternal Aunt    Cancer Maternal Aunt    Cancer Paternal Aunt    Diabetes Paternal Aunt    Diabetes Sister    Diabetes Paternal Uncle    Heart disease Paternal Uncle    Stroke Paternal Uncle    Diabetes Paternal Aunt    Stroke Paternal Aunt     ALLERGIES:  is allergic to codeine.  MEDICATIONS:  Current Outpatient Medications  Medication Sig Dispense Refill   acetaminophen  (TYLENOL ) 500 MG tablet Take 500 mg by mouth every 6 (six) hours as needed for mild pain (pain score 1-3). (Patient taking differently: Take 1,000 mg by mouth in the morning, at noon, and at bedtime.)     ASPIRIN  LOW DOSE 81 MG chewable tablet Chew 81 mg by mouth 2 (two) times daily.     calcium-vitamin D  (OSCAL WITH D) 500-5 MG-MCG tablet Take 1 tablet by mouth daily with breakfast.     celecoxib  (CELEBREX ) 100 MG capsule Take 100 mg by mouth 2 (two) times daily.     Darbepoetin Alfa  (ARANESP ) 500 MCG/ML SOSY injection Inject 500 mcg into the skin See admin instructions. Inject 500 mcg into the skin every week     gabapentin  (NEURONTIN ) 300 MG capsule Take 300 mg by mouth 3 (three) times daily.     levothyroxine  (SYNTHROID ) 25 MCG tablet TAKE ONE TAB BY MOUTH ONCE DAILY. TAKE ON AN EMPTY STOMACH WITH A GLASS OF WATER ATLEAST 30-60 MINUTES BEFORE BREAKFAST 90 tablet 3   loratadine (CLARITIN) 10 MG tablet Take 10 mg by mouth at bedtime.     luspatercept -aamt (REBLOZYL ) 75 MG subcutaneous injection Inject into the skin.     methocarbamol (ROBAXIN) 500 MG tablet Take 500 mg by mouth every 8 (eight) hours as needed.     omeprazole   (PRILOSEC) 20 MG capsule Take 20 mg by mouth daily.     oxyCODONE  (OXY IR/ROXICODONE ) 5 MG immediate release tablet Take 5 mg by mouth every 4 (four) hours as needed.     promethazine  (PHENERGAN ) 25 MG tablet Take 1 tablet (25 mg total) by mouth every 6 (six) hours as needed for nausea or vomiting. 10 tablet 1   Vitamin D , Ergocalciferol , (DRISDOL ) 1.25 MG (50000 UNIT) CAPS capsule TAKE 1 CAPSULES BY MOUTH ONCE A WEEK 12 capsule 1   No current facility-administered medications for this visit.   Facility-Administered Medications Ordered in Other Visits  Medication Dose Route Frequency Provider Last Rate Last Admin   heparin  lock flush 100 unit/mL  500 Units Intravenous Once Brahmanday, Govinda R, MD       sodium chloride  flush (NS) 0.9 % injection 10 mL  10 mL Intravenous PRN Brahmanday, Govinda R, MD        REVIEW OF SYSTEMS:   Constitutional: ( - ) fevers, ( - )  chills , ( - ) night sweats Eyes: ( - ) blurriness of vision, ( - ) double vision, ( - ) watery eyes Ears, nose, mouth, throat, and face: ( - ) mucositis, ( - ) sore throat Respiratory: ( - ) cough, ( - ) dyspnea, ( - ) wheezes Cardiovascular: ( - ) palpitation, ( - ) chest discomfort, ( - ) lower extremity swelling Gastrointestinal:  ( - ) nausea, ( - ) heartburn, ( - ) change in bowel habits Skin: ( - ) abnormal skin rashes Lymphatics: ( - ) new lymphadenopathy, ( - ) easy bruising Neurological: ( - ) numbness, ( - ) tingling, ( - ) new weaknesses Behavioral/Psych: ( - ) mood change, ( - ) new changes  All other systems were reviewed with the patient and are negative.  PHYSICAL EXAMINATION: ECOG PERFORMANCE STATUS: 1 - Symptomatic but completely ambulatory  Vitals:   07/19/24 1055  BP: 125/68  Pulse: 78  Resp: 16  Temp: 97.6 F (36.4 C)  SpO2: 96%   Filed Weights   07/19/24 1055  Weight: 154 lb 12.8 oz (70.2 kg)    GENERAL: well appearing female in NAD  SKIN: skin color, texture, turgor are normal, no rashes or  significant lesions EYES: conjunctiva are pink and non-injected, sclera clear LUNGS: clear to auscultation and percussion with normal breathing effort HEART: regular rate & rhythm and no murmurs and no lower extremity edema Musculoskeletal: no cyanosis of digits and no clubbing  PSYCH: alert & oriented x 3, fluent speech NEURO: no focal motor/sensory deficits  LABORATORY DATA:  I have reviewed the data as listed    Latest Ref Rng & Units 07/19/2024   11:02 AM 06/20/2024   12:45 PM 05/23/2024    9:14 AM  CBC  WBC 4.0 - 10.5 K/uL 13.2  20.7  14.2   Hemoglobin 12.0 - 15.0 g/dL 89.7  89.1  9.8   Hematocrit 36.0 - 46.0 % 32.8  34.5  31.0   Platelets 150 - 400 K/uL 269  296  202        Latest Ref Rng & Units 07/19/2024   11:02 AM 06/20/2024   12:45 PM 05/23/2024    9:14 AM  CMP  Glucose 70 - 99 mg/dL 889  880  866   BUN 8 - 23 mg/dL 26  41  32   Creatinine 0.44 - 1.00 mg/dL 8.86  8.77  8.90   Sodium 135 - 145 mmol/L 139  133  135   Potassium 3.5 - 5.1 mmol/L 4.5  4.3  4.2   Chloride 98 - 111 mmol/L 110  102  107   CO2 22 - 32 mmol/L 21  22  19    Calcium 8.9 - 10.3 mg/dL 9.3  9.1  9.1   Total Protein 6.5 - 8.1 g/dL 8.5  8.4  7.7   Total Bilirubin 0.0 - 1.2 mg/dL 0.9  0.9  0.8   Alkaline Phos 38 - 126 U/L 61  77  68   AST 15 - 41 U/L 21  19  18    ALT 0 - 44 U/L 17  19  14      RADIOGRAPHIC STUDIES: I have personally reviewed the radiological images as listed and agreed with the findings in the report. No results found.  ASSESSMENT & PLAN Carla [name] is a 74 y.o. female who presents to the clinic for continued management of low grade MDS.   #Low-grade MDS --with ringed sideroblasts; no blasts, positive for SF3B1; R-IPSS-low risk  --BMBx from March 2023 showed no evidence of obvious progression of MDS or any acute process.  --Currently on Luspatercept  q 21 days --Labs from today show WBC 13.2, Hgb 10.2, MCV 95.6, Plt 269, creatinine stable at 1.13, LFTs normal --Proceed with  treatment today without any dose modifications. --RTC on 08/15/2024 for labs, follow up and luspatercept  treatment.   #Leukocytosis-neutrophil predominant: --WBC has improved from 20.7 to 13.2.  --Peripheral flow cytometry from April 2025 showed no significant immunophenotypic abnormality detected  neutrophilia with left-shifted maturation and monocytosis. --etiology includes steroid injection/prednisone  --no infectious symptoms noted --monitor for now.  #Low back pain: --Under the care of neurosurgery. Most recent MRI lumbar spine showed severe narrowing at L4-5. Patient plans to undergo L4-5 decompression, instrumentation and fusion.    No orders of the defined types were placed in this encounter.   All questions were answered. The patient knows to call the clinic with any problems, questions or concerns.  I have spent a total of 30 minutes minutes of face-to-face and non-face-to-face time, preparing to  see the patient, performing a medically appropriate examination, counseling and educating the patient, documenting clinical information in the electronic health record, independently interpreting results and communicating results to the patient, and care coordination.   Johnston Police, [name] Department of Hematology/Oncology [facility]

## 2024-07-19 ENCOUNTER — Inpatient Hospital Stay: Attending: Internal Medicine

## 2024-07-19 ENCOUNTER — Telehealth: Payer: Self-pay | Admitting: *Deleted

## 2024-07-19 ENCOUNTER — Inpatient Hospital Stay: Admitting: Physician Assistant

## 2024-07-19 ENCOUNTER — Inpatient Hospital Stay

## 2024-07-19 ENCOUNTER — Encounter: Payer: Self-pay | Admitting: Physician Assistant

## 2024-07-19 ENCOUNTER — Encounter: Payer: Self-pay | Admitting: Internal Medicine

## 2024-07-19 VITALS — BP 125/68 | HR 78 | Temp 97.6°F | Resp 16 | Ht 64.0 in | Wt 154.8 lb

## 2024-07-19 DIAGNOSIS — M545 Low back pain, unspecified: Secondary | ICD-10-CM | POA: Diagnosis not present

## 2024-07-19 DIAGNOSIS — D461 Refractory anemia with ring sideroblasts: Secondary | ICD-10-CM | POA: Diagnosis not present

## 2024-07-19 DIAGNOSIS — D72829 Elevated white blood cell count, unspecified: Secondary | ICD-10-CM | POA: Diagnosis not present

## 2024-07-19 DIAGNOSIS — D46Z Other myelodysplastic syndromes: Secondary | ICD-10-CM | POA: Diagnosis not present

## 2024-07-19 LAB — CMP (CANCER CENTER ONLY)
ALT: 17 U/L (ref 0–44)
AST: 21 U/L (ref 15–41)
Albumin: 4.3 g/dL (ref 3.5–5.0)
Alkaline Phosphatase: 61 U/L (ref 38–126)
Anion gap: 8 (ref 5–15)
BUN: 26 mg/dL — ABNORMAL HIGH (ref 8–23)
CO2: 21 mmol/L — ABNORMAL LOW (ref 22–32)
Calcium: 9.3 mg/dL (ref 8.9–10.3)
Chloride: 110 mmol/L (ref 98–111)
Creatinine: 1.13 mg/dL — ABNORMAL HIGH (ref 0.44–1.00)
GFR, Estimated: 51 mL/min — ABNORMAL LOW (ref >60)
Glucose, Bld: 110 mg/dL — ABNORMAL HIGH (ref 70–99)
Potassium: 4.5 mmol/L (ref 3.5–5.1)
Sodium: 139 mmol/L (ref 135–145)
Total Bilirubin: 0.9 mg/dL (ref 0.0–1.2)
Total Protein: 8.5 g/dL — ABNORMAL HIGH (ref 6.5–8.1)

## 2024-07-19 LAB — CBC WITH DIFFERENTIAL (CANCER CENTER ONLY)
Abs Immature Granulocytes: 0.77 K/uL — ABNORMAL HIGH (ref 0.00–0.07)
Basophils Absolute: 0.1 K/uL (ref 0.0–0.1)
Basophils Relative: 1 %
Eosinophils Absolute: 0.1 K/uL (ref 0.0–0.5)
Eosinophils Relative: 1 %
HCT: 32.8 % — ABNORMAL LOW (ref 36.0–46.0)
Hemoglobin: 10.2 g/dL — ABNORMAL LOW (ref 12.0–15.0)
Immature Granulocytes: 6 %
Lymphocytes Relative: 14 %
Lymphs Abs: 1.9 K/uL (ref 0.7–4.0)
MCH: 29.7 pg (ref 26.0–34.0)
MCHC: 31.1 g/dL (ref 30.0–36.0)
MCV: 95.6 fL (ref 80.0–100.0)
Monocytes Absolute: 1.8 K/uL — ABNORMAL HIGH (ref 0.1–1.0)
Monocytes Relative: 14 %
Neutro Abs: 8.6 K/uL — ABNORMAL HIGH (ref 1.7–7.7)
Neutrophils Relative %: 64 %
Platelet Count: 269 K/uL (ref 150–400)
RBC: 3.43 MIL/uL — ABNORMAL LOW (ref 3.87–5.11)
RDW: 27.9 % — ABNORMAL HIGH (ref 11.5–15.5)
Smear Review: NORMAL
WBC Count: 13.2 K/uL — ABNORMAL HIGH (ref 4.0–10.5)
nRBC: 0.8 % — ABNORMAL HIGH (ref 0.0–0.2)

## 2024-07-19 MED ORDER — LUSPATERCEPT-AAMT 75 MG ~~LOC~~ SOLR
1.0000 mg/kg | Freq: Once | SUBCUTANEOUS | Status: AC
  Administered 2024-07-19: 70 mg via SUBCUTANEOUS
  Filled 2024-07-19: qty 1.4

## 2024-07-19 NOTE — Telephone Encounter (Signed)
 Hematology Clinic received Pre operative clearance form from Dr Mavis at [facility]. From MDS standpoint , patients labs are stable. OK to proceed from our standpoint  for Lumbar fusion.

## 2024-07-19 NOTE — Progress Notes (Signed)
 C/o back pain getting worse, 8/10.(On-going) She sees Dr. Mavis. She needs clearance to have back surgery from our office. A form was faxed this morning to us .

## 2024-07-23 ENCOUNTER — Ambulatory Visit: Payer: Self-pay

## 2024-07-23 ENCOUNTER — Other Ambulatory Visit: Payer: Self-pay | Admitting: Neurosurgery

## 2024-07-23 NOTE — Telephone Encounter (Signed)
 We can change her health maintenance visit to an acute visit for that , then re schedule annual for another day (not difficult) More important to manage the acute problem Would do a shot if appropriate- cannot tell her without evaluating first what the treatment plan would be Thanks

## 2024-07-23 NOTE — Telephone Encounter (Signed)
 FYI Only or Action Required?: FYI only for provider.  Patient was last seen in primary care on 07/13/2023 by Carla [name], MD.  Called Nurse Triage reporting Back Pain.  Symptoms began several months ago.  Interventions attempted: Rest, hydration, or home remedies.  Symptoms are: gradually worsening.  Triage Disposition: See PCP When Office is Open (Within 3 Days)  Patient/caregiver understands and will follow disposition?: Yes Reason for Disposition  [1] Pain radiates into the thigh or further down the leg AND [2] one leg  Answer Assessment - Initial Assessment Questions Scheduled for back surgery in 2 weeks. Stated she called surgeons office and they're on vacation. Patient states she can hardly walk and is looking for medication to deal with the pain and wondering if PCP will do that. Patient has appointment with Dr. [name], patient asking if PCP can give injection. Advised patient I am unsure and she can go to UC if the pain is excruciating. Given UC information.   1. ONSET: When did the pain begin? (e.g., minutes, hours, days)     Fall back in June, had shoulder replacement surgery, happening since then.   2. LOCATION: Where does it hurt? (upper, mid or lower back)     Lower  3. SEVERITY: How bad is the pain?  (e.g., Scale 1-10; mild, moderate, or severe)     Severe  4. PATTERN: Is the pain constant? (e.g., yes, no; constant, intermittent)      Constant  5. RADIATION: Does the pain shoot into your legs or somewhere else?     Down legs  6. OTHER SYMPTOMS: Do you have any other symptoms? (e.g., fever, abdomen pain, burning with urination, blood in urine)       Denies  Protocols used: Back Pain-A-AH  Copied from CRM #[id number]. Topic: Clinical - Red Word Triage >> Jul 23, 2024  3:59 PM Burnard DEL wrote: Red Word that prompted transfer to Nurse Triage: excruciating back pain

## 2024-07-23 NOTE — Telephone Encounter (Unsigned)
 Copied from CRM [id number]. Topic: General - Other >> Jul 23, 2024  3:56 PM Burnard DEL wrote: Reason for CRM: Patient is scheduled for er physical with provider tomorrow morning.Patient called in stating that she is having  excruciating back pain ,and would like to know if she would be able to get a Toradol or kenolg injection when she comes in tomorrow?

## 2024-07-24 ENCOUNTER — Ambulatory Visit: Admitting: Family Medicine

## 2024-07-24 ENCOUNTER — Encounter: Payer: Self-pay | Admitting: Family Medicine

## 2024-07-24 ENCOUNTER — Ambulatory Visit: Payer: Self-pay | Admitting: Family Medicine

## 2024-07-24 ENCOUNTER — Other Ambulatory Visit: Payer: Self-pay | Admitting: Family Medicine

## 2024-07-24 VITALS — BP 122/68 | HR 78 | Temp 98.3°F | Ht 63.0 in | Wt 154.5 lb

## 2024-07-24 DIAGNOSIS — Z79899 Other long term (current) drug therapy: Secondary | ICD-10-CM | POA: Diagnosis not present

## 2024-07-24 DIAGNOSIS — N1831 Chronic kidney disease, stage 3a: Secondary | ICD-10-CM

## 2024-07-24 DIAGNOSIS — Z1231 Encounter for screening mammogram for malignant neoplasm of breast: Secondary | ICD-10-CM | POA: Diagnosis not present

## 2024-07-24 DIAGNOSIS — E559 Vitamin D deficiency, unspecified: Secondary | ICD-10-CM

## 2024-07-24 DIAGNOSIS — G629 Polyneuropathy, unspecified: Secondary | ICD-10-CM

## 2024-07-24 DIAGNOSIS — Z13220 Encounter for screening for lipoid disorders: Secondary | ICD-10-CM | POA: Insufficient documentation

## 2024-07-24 DIAGNOSIS — R7303 Prediabetes: Secondary | ICD-10-CM

## 2024-07-24 DIAGNOSIS — Z0000 Encounter for general adult medical examination without abnormal findings: Secondary | ICD-10-CM | POA: Diagnosis not present

## 2024-07-24 DIAGNOSIS — E039 Hypothyroidism, unspecified: Secondary | ICD-10-CM | POA: Diagnosis not present

## 2024-07-24 LAB — LIPID PANEL
Cholesterol: 146 mg/dL (ref 0–200)
HDL: 60.2 mg/dL (ref >39.00)
LDL Cholesterol: 73 mg/dL (ref 0–99)
NonHDL: 86.22
Total CHOL/HDL Ratio: 2
Triglycerides: 68 mg/dL (ref 0.0–149.0)
VLDL: 13.6 mg/dL (ref 0.0–40.0)

## 2024-07-24 LAB — VITAMIN D 25 HYDROXY (VIT D DEFICIENCY, FRACTURES): VITD: 37.75 ng/mL (ref 30.00–100.00)

## 2024-07-24 LAB — HEMOGLOBIN A1C: Hgb A1c MFr Bld: 6.1 % (ref 4.6–6.5)

## 2024-07-24 LAB — VITAMIN B12: Vitamin B-12: 593 pg/mL (ref 211–911)

## 2024-07-24 LAB — TSH: TSH: 2.15 u[IU]/mL (ref 0.35–5.50)

## 2024-07-24 MED ORDER — LEVOTHYROXINE SODIUM 25 MCG PO TABS: ORAL_TABLET | ORAL | 3 refills | Status: AC

## 2024-07-24 NOTE — Progress Notes (Signed)
 Subjective:    Patient ID: Carla [name], female    DOB: 07/20/1951, 73 y.o.   MRN: [id number]  HPI  Here for health maintenance exam and to review chronic medical problems   Wt Readings from Last 3 Encounters:  07/24/24 154 lb 8 oz (70.1 kg)  07/19/24 154 lb 12.8 oz (70.2 kg)  06/20/24 153 lb 9.6 oz (69.7 kg)   27.37 kg/m  Vitals:   07/24/24 0854  BP: 122/68  Pulse: 78  Temp: 98.3 F (36.8 C)  SpO2: 94%    Immunization History  Administered Date(s) Administered   Fluad Quad(high Dose 65+) 07/05/2019, 06/24/2020, 07/07/2021   Fluad Trivalent(High Dose 65+) 07/13/2023   INFLUENZA, HIGH DOSE SEASONAL PF 07/13/2015, 06/27/2017, 07/11/2024   Influenza Split 06/22/2011   Influenza Whole 06/26/2008, 07/15/2009, 08/17/2012   Influenza,inj,Quad PF,6+ Mos 07/18/2013   Influenza-Unspecified 07/11/2014, 07/09/2016   PFIZER(Purple Top)SARS-COV-2 Vaccination 11/03/2019, 11/24/2019   Pneumococcal Conjugate-13 10/10/2015   Pneumococcal Polysaccharide-23 12/14/2016   Tdap 06/22/2011, 03/06/2024   Zoster Recombinant(Shingrix) 07/31/2019, 12/19/2019   Zoster, Live 07/05/2012    Health Maintenance Due  Topic Date Due   Mammogram  07/25/2024    Had shoulder replacement in June after a fall  Also hurt her back  Has a fusion planned on 11/10   Mammogram 06/2023  Self breast exam- no lumps   Gyn health no issues    Colon cancer screening  Colonoscopy 07/2015   Bone health  Dexa 08/2023 -normal bmd  Falls- June , shoulder and back injury  Fractures -shoulder  Supplements -high dose weekly D3  Ca on and off  Last vitamin D  Lab Results  Component Value Date   VD25OH 37.75 07/24/2024    Exercise -limited  Will do PT after her surgery    Mood    07/19/2024   11:17 AM 06/20/2024    1:14 PM 05/30/2024   10:33 AM 05/23/2024    9:31 AM 04/25/2024    3:06 PM  Depression screen PHQ 2/9  Decreased Interest 0 0 0 0 0  Down, Depressed, Hopeless 0 0 0 0 0  PHQ - 2 Score 0  0 0 0 0    Hypothyroidism  Pt has no clinical changes No change in energy level/ hair or skin/ edema and no tremor Lab Results  Component Value Date   TSH 2.15 07/24/2024    Levothyroxine  25 mcg daily  Due for labs    Ckd Lab Results  Component Value Date   NA 139 07/19/2024   K 4.5 07/19/2024   CO2 21 (L) 07/19/2024   GLUCOSE 110 (H) 07/19/2024   BUN 26 (H) 07/19/2024   CREATININE 1.13 (H) 07/19/2024   CALCIUM 9.3 07/19/2024   GFR 56.05 (L) 07/06/2023   GFRNONAA 51 (L) 07/19/2024   Celebrex  100 mg -will stop before surgery   Sees hematology for low grade MDS Lab Results  Component Value Date   WBC 13.2 (H) 07/19/2024   HGB 10.2 (L) 07/19/2024   HCT 32.8 (L) 07/19/2024   MCV 95.6 07/19/2024   PLT 269 07/19/2024   Takes omeprazole   Lab Results  Component Value Date   VITAMINB12 593 07/24/2024     Prediabetes Lab Results  Component Value Date   HGBA1C 6.1 07/24/2024   HGBA1C 5.8 07/06/2023   HGBA1C 5.4 04/23/2022    Last lipid screen  Lab Results  Component Value Date   CHOL 146 07/24/2024   HDL 60.20 07/24/2024   LDLCALC 73 07/24/2024  LDLDIRECT 123.0 09/16/2009   TRIG 68.0 07/24/2024   CHOLHDL 2 07/24/2024       Patient Active Problem List   Diagnosis Date Noted   Lipid screening 07/24/2024   Current use of proton pump inhibitor 07/24/2024   Immunocompromised 07/13/2023   Prediabetes 07/03/2023   Chronic kidney disease (CKD) stage G3a/A1, moderately decreased glomerular filtration rate (GFR) between 45-59 mL/min/1.73 square meter and albuminuria creatinine ratio less than 30 mg/g (HCC) 07/03/2023   Neuropathy 05/27/2023   Nontraumatic incomplete tear of right rotator cuff 05/03/2022   Rotator cuff tendinitis, right 05/03/2022   Tendinitis of upper biceps tendon of right shoulder 05/03/2022   Encounter for screening mammogram for breast cancer 04/27/2021   Vitamin D  deficiency 04/27/2021   Symptomatic anemia 05/17/2020   Goals of care,  counseling/discussion 04/15/2020   MDS (myelodysplastic syndrome), 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   Hypothyroidism 12/20/2017   Normocytic anemia 12/19/2017   Estrogen deficiency 10/10/2015   Encounter for routine gynecological examination 06/22/2011   Routine general medical examination at a health care facility 06/15/2011   Allergic rhinitis 06/26/2008   Past Medical History:  Diagnosis Date   Allergic rhinitis, cause unspecified    Anemia, unspecified    Arthritis    Cancer (HCC)    Carpal tunnel syndrome    Cataract 2007/2010   Cystitis, unspecified    Family history of osteoporosis    Hypothyroidism    PONV (postoperative nausea and vomiting)    Postnasal drip    Syncope and collapse    Past Surgical History:  Procedure Laterality Date   BONE MARROW BIOPSY     CARPAL TUNNEL RELEASE     COLONOSCOPY  09/28/2003   COLONOSCOPY WITH PROPOFOL  N/A 07/31/2015   Procedure: COLONOSCOPY WITH PROPOFOL ;  Surgeon: Lamar Bunk, MD;  Location: [facility];  Service: Endoscopy;  Laterality: N/A;   DIAGNOSTIC LAPAROSCOPY     dx lack of pregnancy    EYE SURGERY     lasik, cataract, prk,yag procedure   REVERSE SHOULDER ARTHROPLASTY Left 03/21/2024   Procedure: ARTHROPLASTY, SHOULDER, TOTAL, REVERSE FOR FRACTURE;  Surgeon: Cristy Bonner DASEN, MD;  Location: [facility];  Service: Orthopedics;  Laterality: Left;   Social History   Tobacco Use   Smoking status: Never   Smokeless tobacco: Never  Vaping Use   Vaping status: Never Used  Substance Use Topics   Alcohol use: Not Currently    Comment: Rare   Drug use: No   Family History  Problem Relation Age of Onset   Osteoporosis Mother    Stroke Mother    Arthritis Mother    Coronary artery disease Father    Stroke Father 50   Arthritis Father    Heart disease Father    Diabetes Other        Aunts and uncles   Breast cancer Paternal Aunt    Breast cancer Maternal Aunt    Arthritis/Rheumatoid Child    Breast cancer Cousin    Cancer  Maternal Aunt    Cancer Maternal Aunt    Cancer Paternal Aunt    Diabetes Paternal Aunt    Diabetes Sister    Diabetes Paternal Uncle    Heart disease Paternal Uncle    Stroke Paternal Uncle    Diabetes Paternal Aunt    Stroke Paternal Aunt    Allergies  Allergen Reactions   Codeine Nausea Only   Current Outpatient Medications on File Prior to Visit  Medication Sig Dispense Refill   acetaminophen  (TYLENOL )  500 MG tablet Take 500 mg by mouth every 6 (six) hours as needed for mild pain (pain score 1-3). (Patient taking differently: Take 1,000 mg by mouth in the morning, at noon, and at bedtime.)     ASPIRIN  LOW DOSE 81 MG chewable tablet Chew 81 mg by mouth 2 (two) times daily.     calcium-vitamin D  (OSCAL WITH D) 500-5 MG-MCG tablet Take 1 tablet by mouth daily with breakfast.     celecoxib  (CELEBREX ) 100 MG capsule Take 100 mg by mouth 2 (two) times daily.     Darbepoetin Alfa  (ARANESP ) 500 MCG/ML SOSY injection Inject 500 mcg into the skin See admin instructions. Inject 500 mcg into the skin every week as needed     gabapentin  (NEURONTIN ) 300 MG capsule Take 300 mg by mouth 3 (three) times daily.     levothyroxine  (SYNTHROID ) 25 MCG tablet TAKE ONE TAB BY MOUTH ONCE DAILY. TAKE ON AN EMPTY STOMACH WITH A GLASS OF WATER ATLEAST 30-60 MINUTES BEFORE BREAKFAST 90 tablet 3   loratadine (CLARITIN) 10 MG tablet Take 10 mg by mouth at bedtime.     luspatercept -aamt (REBLOZYL ) 75 MG subcutaneous injection Inject into the skin.     methocarbamol (ROBAXIN) 500 MG tablet Take 500 mg by mouth every 8 (eight) hours as needed.     omeprazole  (PRILOSEC) 20 MG capsule Take 20 mg by mouth daily.     oxyCODONE  (OXY IR/ROXICODONE ) 5 MG immediate release tablet Take 5 mg by mouth every 6 (six) hours as needed.     promethazine  (PHENERGAN ) 25 MG tablet Take 1 tablet (25 mg total) by mouth every 6 (six) hours as needed for nausea or vomiting. 10 tablet 1   Vitamin D , Ergocalciferol , (DRISDOL ) 1.25 MG (50000  UNIT) CAPS capsule TAKE 1 CAPSULES BY MOUTH ONCE A WEEK 12 capsule 1   Current Facility-Administered Medications on File Prior to Visit  Medication Dose Route Frequency Provider Last Rate Last Admin   heparin  lock flush 100 unit/mL  500 Units Intravenous Once Brahmanday, Govinda R, MD       sodium chloride  flush (NS) 0.9 % injection 10 mL  10 mL Intravenous PRN Brahmanday, Govinda R, MD        Review of Systems  Constitutional:  Positive for fatigue. Negative for activity change, appetite change, fever and unexpected weight change.  HENT:  Negative for congestion, ear pain, rhinorrhea, sinus pressure and sore throat.   Eyes:  Negative for pain, redness and visual disturbance.  Respiratory:  Negative for cough, shortness of breath and wheezing.   Cardiovascular:  Negative for chest pain and palpitations.  Gastrointestinal:  Negative for abdominal pain, blood in stool, constipation and diarrhea.  Endocrine: Negative for polydipsia and polyuria.  Genitourinary:  Negative for dysuria, frequency and urgency.  Musculoskeletal:  Positive for back pain. Negative for arthralgias and myalgias.  Skin:  Negative for pallor and rash.  Allergic/Immunologic: Negative for environmental allergies.  Neurological:  Negative for dizziness, syncope and headaches.  Hematological:  Negative for adenopathy. Does not bruise/bleed easily.  Psychiatric/Behavioral:  Negative for decreased concentration and dysphoric mood. The patient is not nervous/anxious.        Objective:   Physical Exam Constitutional:      General: She is not in acute distress.    Appearance: Normal appearance. She is well-developed and normal weight. She is not ill-appearing or diaphoretic.  HENT:     Head: Normocephalic and atraumatic.     Right Ear: Tympanic membrane, ear  canal and external ear normal.     Left Ear: Tympanic membrane, ear canal and external ear normal.     Nose: Nose normal. No congestion.     Mouth/Throat:      Mouth: Mucous membranes are moist.     Pharynx: Oropharynx is clear. No posterior oropharyngeal erythema.  Eyes:     General: No scleral icterus.    Extraocular Movements: Extraocular movements intact.     Conjunctiva/sclera: Conjunctivae normal.     Pupils: Pupils are equal, round, and reactive to light.  Neck:     Thyroid : No thyromegaly.     Vascular: No carotid bruit or JVD.  Cardiovascular:     Rate and Rhythm: Normal rate and regular rhythm.     Pulses: Normal pulses.     Heart sounds: Normal heart sounds.     No gallop.  Pulmonary:     Effort: Pulmonary effort is normal. No respiratory distress.     Breath sounds: Normal breath sounds. No wheezing.     Comments: Good air exch Chest:     Chest wall: No tenderness.  Abdominal:     General: Bowel sounds are normal. There is no distension or abdominal bruit.     Palpations: Abdomen is soft. There is no mass.     Tenderness: There is no abdominal tenderness.     Hernia: No hernia is present.  Genitourinary:    Comments: Breast exam: No mass, nodules, thickening, tenderness, bulging, retraction, inflamation, nipple discharge or skin changes noted.  No axillary or clavicular LA.     Musculoskeletal:        General: No tenderness. Normal range of motion.     Cervical back: Normal range of motion and neck supple. No rigidity. No muscular tenderness.     Right lower leg: No edema.     Left lower leg: No edema.     Comments: No kyphosis   Limited mobility due to back pain    Lymphadenopathy:     Cervical: No cervical adenopathy.  Skin:    General: Skin is warm and dry.     Coloration: Skin is not pale.     Findings: No erythema or rash.     Comments: Solar lentigines diffusely   Neurological:     Mental Status: She is alert. Mental status is at baseline.     Cranial Nerves: No cranial nerve deficit.     Motor: No abnormal muscle tone.     Coordination: Coordination normal.     Gait: Gait normal.     Deep Tendon  Reflexes: Reflexes are normal and symmetric. Reflexes normal.  Psychiatric:        Mood and Affect: Mood normal.        Cognition and Memory: Cognition and memory normal.           Assessment & Plan:   Problem List Items Addressed This Visit       Endocrine   Hypothyroidism   TSH today  Levothyroxine  25 mcg daily       Relevant Orders   TSH (Completed)     Nervous and Auditory   Neuropathy   Continues gabapentin  with caution of sedation         Genitourinary   Chronic kidney disease (CKD) stage G3a/A1, moderately decreased glomerular filtration rate (GFR) between 45-59 mL/min/1.73 square meter and albuminuria creatinine ratio less than 30 mg/g (HCC)   Last GFR 56.05 Encouraged to get off celebrex  soon for back surgery  and not return to it          Other   Vitamin D  deficiency   Relevant Orders   VITAMIN D  25 Hydroxy (Vit-D Deficiency, Fractures) (Completed)   Routine general medical examination at a health care facility - Primary   Reviewed health habits including diet and exercise and skin cancer prevention Reviewed appropriate screening tests for age  Also reviewed health mt list, fam hx and immunization status , as well as social and family history   See HPI Labs reviewed and ordered Health Maintenance  Topic Date Due   Breast Cancer Screening  07/25/2024   COVID-19 Vaccine (3 - Pfizer risk series) 07/28/2024*   Medicare Annual Wellness Visit  05/30/2025   Colon Cancer Screening  07/30/2025   DTaP/Tdap/Td vaccine (3 - Td or Tdap) 03/06/2034   Pneumococcal Vaccine for age over 45  Completed   Flu Shot  Completed   DEXA scan (bone density measurement)  Completed   Hepatitis C Screening  Completed   Zoster (Shingles) Vaccine  Completed   Meningitis B Vaccine  Aged Out  *Topic was postponed. The date shown is not the original due date.    Mammogram ordered  Discussed fall prevention, supplements and exercise for bone density  PHQ 0 Lumbar surgery  planned soon, will be more active after that       Prediabetes   A1c ordered  disc imp of low glycemic diet and wt loss to prevent DM2       Relevant Orders   Hemoglobin A1c (Completed)   Lipid screening   Lipid panel today   Disc goals for lipids and reasons to control them Rev last labs with pt Rev low sat fat diet in detail       Relevant Orders   Lipid Panel (Completed)   Encounter for screening mammogram for breast cancer   Mammo ordered Pt will schedule after her back surgery       Relevant Orders   MM 3D SCREENING MAMMOGRAM BILATERAL BREAST   Current use of proton pump inhibitor   Lab Results  Component Value Date   VITAMINB12 593 07/24/2024   Last vitamin D  Lab Results  Component Value Date   VD25OH 37.75 07/24/2024         Relevant Orders   Vitamin B12 (Completed)

## 2024-07-24 NOTE — Assessment & Plan Note (Signed)
 A1c ordered   disc imp of low glycemic diet and wt loss to prevent DM2

## 2024-07-24 NOTE — Assessment & Plan Note (Signed)
 TSH today  Levothyroxine  25 mcg daily

## 2024-07-24 NOTE — Assessment & Plan Note (Signed)
 Lipid panel today   Disc goals for lipids and reasons to control them Rev last labs with pt Rev low sat fat diet in detail

## 2024-07-24 NOTE — Telephone Encounter (Unsigned)
 Copied from CRM #[id number]. Topic: Clinical - Medication Refill >> Jul 24, 2024 11:20 AM Macario HERO wrote: Medication: omeprazole  (PRILOSEC) 20 MG capsule [[id number]]  Has the patient contacted their pharmacy? No (Agent: If no, request that the patient contact the pharmacy for the refill. If patient does not wish to contact the pharmacy document the reason why and proceed with request.) (Agent: If yes, when and what did the pharmacy advise?)  This is the patient's preferred pharmacy:  [id number] - [address] [place] [address] Phone: [phone] Fax: [phone]  Is this the correct pharmacy for this prescription? Yes If no, delete pharmacy and type the correct one.   Has the prescription been filled recently? Yes  Is the patient out of the medication? Yes  Has the patient been seen for an appointment in the last year OR does the patient have an upcoming appointment? Yes  Can we respond through MyChart? No  Agent: Please be advised that Rx refills [place] take up to 3 business days. We ask that you follow-up with your pharmacy.

## 2024-07-24 NOTE — Assessment & Plan Note (Signed)
 Continues gabapentin  with caution of sedation

## 2024-07-24 NOTE — Assessment & Plan Note (Signed)
 Reviewed health habits including diet and exercise and skin cancer prevention Reviewed appropriate screening tests for age  Also reviewed health mt list, fam hx and immunization status , as well as social and family history   See HPI Labs reviewed and ordered Health Maintenance  Topic Date Due   Breast Cancer Screening  07/25/2024   COVID-19 Vaccine (3 - Pfizer risk series) 07/28/2024*   Medicare Annual Wellness Visit  05/30/2025   Colon Cancer Screening  07/30/2025   DTaP/Tdap/Td vaccine (3 - Td or Tdap) 03/06/2034   Pneumococcal Vaccine for age over 12  Completed   Flu Shot  Completed   DEXA scan (bone density measurement)  Completed   Hepatitis C Screening  Completed   Zoster (Shingles) Vaccine  Completed   Meningitis B Vaccine  Aged Out  *Topic was postponed. The date shown is not the original due date.    Mammogram ordered  Discussed fall prevention, supplements and exercise for bone density  PHQ 0 Lumbar surgery planned soon, will be more active after that

## 2024-07-24 NOTE — Telephone Encounter (Signed)
 Pt was seen already for her CPE with PCP today

## 2024-07-24 NOTE — Assessment & Plan Note (Signed)
 Lab Results  Component Value Date   VITAMINB12 593 07/24/2024   Last vitamin D  Lab Results  Component Value Date   VD25OH 37.75 07/24/2024

## 2024-07-24 NOTE — Patient Instructions (Addendum)
 Take care of yourself   Continue the vitamin D  for bone health  Good luck with your surgery   Lab today    You have an order for:  [x]   3D Mammogram  []   Bone Density     Please call for appointment:   [x]   [facility]  [address] [place] [address]  [phone]  []   [facility])   [address]  [place], [place] [address]  [phone]  []   [facility]      [address] [place], [place]        [phone]         []   [facility]  [address] [place], [place]  [phone]  []  [facility]   [address]   [place], [place] [address]  [phone]  []  [facility]  [address] [place], [place] [address] [phone]    Make sure to wear two piece clothing  No lotions powders or deodorants the day of the appointment Make sure to bring picture ID and insurance card.  Bring list of medications you are currently taking including any supplements.   Schedule your screening mammogram through MyChart!   Select [place] imaging sites can now be scheduled through MyChart.  Log into your MyChart account.  Go to 'Visit' (or 'Appointments' if  on mobile App) --> Schedule an  Appointment  Under 'Select a Reason for Visit' choose the Mammogram  Screening option.  Complete the pre-visit questions  and select the time and place that  best fits your schedule

## 2024-07-24 NOTE — Assessment & Plan Note (Signed)
 Last GFR 56.05 Encouraged to get off celebrex  soon for back surgery and not return to it

## 2024-07-24 NOTE — Assessment & Plan Note (Signed)
 Mammo ordered Pt will schedule after her back surgery

## 2024-07-25 MED ORDER — OMEPRAZOLE 20 MG PO CPDR: 20.0000 mg | DELAYED_RELEASE_CAPSULE | Freq: Every day | ORAL | 2 refills | Status: AC

## 2024-07-27 ENCOUNTER — Encounter: Payer: Self-pay | Admitting: Internal Medicine

## 2024-07-30 NOTE — Pre-Procedure Instructions (Signed)
 Surgical Instructions   Your procedure is scheduled on Monday, November 10th. Report to [facility] Main Entrance A at 10:50 A.M., then check in with the Admitting office. Any questions or running late day of surgery: call [phone]  Questions prior to your surgery date: call [phone], Monday-Friday, 8am-4pm. If you experience any cold or flu symptoms such as cough, fever, chills, shortness of breath, etc. between now and your scheduled surgery, please notify us  at the above number.     Remember:  Do not eat or drink after midnight the night before your surgery    Take these medicines the morning of surgery with A SIP OF WATER  gabapentin  (NEURONTIN )  levothyroxine  (SYNTHROID )  methocarbamol (ROBAXIN)  omeprazole  (PRILOSEC)    [place] take these medicines IF NEEDED: acetaminophen  (TYLENOL )  oxyCODONE  (OXY IR/ROXICODONE )  promethazine  (PHENERGAN )    Stop taking Aspirin  5 days before surgery. Last dose will be 11/4.   One week prior to surgery, STOP taking any Aleve, Naproxen, Ibuprofen, Motrin, Advil, Goody's, BC's, all herbal medications, fish oil, and non-prescription vitamins. This includes celecoxib  (CELEBREX ).                     Do NOT Smoke (Tobacco/Vaping) for 24 hours prior to your procedure.  If you use a CPAP at night, you [place] bring your mask/headgear for your overnight stay.   You will be asked to remove any contacts, glasses, piercing's, hearing aid's, dentures/partials prior to surgery. Please bring cases for these items if needed.    Patients discharged the day of surgery will not be allowed to drive home, and someone needs to stay with them for 24 hours.  SURGICAL WAITING ROOM VISITATION Patients [place] have no more than 2 support people in the waiting area - these visitors Pixley rotate.   Pre-op nurse will coordinate an appropriate time for 1 ADULT support person, who [place] not rotate, to accompany patient in pre-op.  Children under the age of 59 must have an  adult with them who is not the patient and must remain in the main waiting area with an adult.  If the patient needs to stay at the hospital during part of their recovery, the visitor guidelines for inpatient rooms apply.  Please refer to the [facility] website for the visitor guidelines for any additional information.   If you received a COVID test during your pre-op visit  it is requested that you wear a mask when out in public, stay away from anyone that [place] not be feeling well and notify your surgeon if you develop symptoms. If you have been in contact with anyone that has tested positive in the last 10 days please notify you surgeon.      Pre-operative 4 CHG Bathing Instructions   You can play a key role in reducing the risk of infection after surgery. Your skin needs to be as free of germs as possible. You can reduce the number of germs on your skin by washing with CHG (chlorhexidine  gluconate) soap before surgery. CHG is an antiseptic soap that kills germs and continues to kill germs even after washing.   DO NOT use if you have an allergy to chlorhexidine /CHG or antibacterial soaps. If your skin becomes reddened or irritated, stop using the CHG and notify one of our RNs at [phone].   Please shower with the CHG soap starting 4 days before surgery using the following schedule:     Please keep in mind the following:  DO  NOT shave, including legs and underarms, starting the day of your first shower.   You Gal shave your face at any point before/day of surgery.  Place clean sheets on your bed the day you start using CHG soap. Use a clean washcloth (not used since being washed) for each shower. DO NOT sleep with pets once you start using the CHG.   CHG Shower Instructions:  Wash your face and private area with normal soap. If you choose to wash your hair, wash first with your normal shampoo.  After you use shampoo/soap, rinse your hair and body thoroughly to remove shampoo/soap  residue.  Turn the water OFF and apply  bottle of CHG soap to a CLEAN washcloth.  Apply CHG soap ONLY FROM YOUR NECK DOWN TO YOUR TOES (washing for 3-5 minutes)  DO NOT use CHG soap on face, private areas, open wounds, or sores.  Pay special attention to the area where your surgery is being performed.  If you are having back surgery, having someone wash your back for you Palin be helpful. Wait 2 minutes after CHG soap is applied, then you [place] rinse off the CHG soap.  Pat dry with a clean towel  Put on clean clothes/pajamas   If you choose to wear lotion, please use ONLY the CHG-compatible lotions that are listed below.  Additional instructions for the day of surgery:  If you choose, you [place] shower the morning of surgery with an antibacterial soap.  DO NOT APPLY any lotions, deodorants, cologne, or perfumes.   Do not bring valuables to the hospital. [facility] is not responsible for any belongings/valuables. Do not wear nail polish, gel polish, artificial nails, or any other type of covering on natural nails (fingers and toes) Do not wear jewelry or makeup Put on clean/comfortable clothes.  Please brush your teeth.  Ask your nurse before applying any prescription medications to the skin.     CHG Compatible Lotions   Aveeno Moisturizing lotion  Cetaphil Moisturizing Cream  Cetaphil Moisturizing Lotion  Clairol Herbal Essence Moisturizing Lotion, Dry Skin  Clairol Herbal Essence Moisturizing Lotion, Extra Dry Skin  Clairol Herbal Essence Moisturizing Lotion, Normal Skin  Curel Age Defying Therapeutic Moisturizing Lotion with Alpha Hydroxy  Curel Extreme Care Body Lotion  Curel Soothing Hands Moisturizing Hand Lotion  Curel Therapeutic Moisturizing Cream, Fragrance-Free  Curel Therapeutic Moisturizing Lotion, Fragrance-Free  Curel Therapeutic Moisturizing Lotion, Original Formula  Eucerin Daily Replenishing Lotion  Eucerin Dry Skin Therapy Plus Alpha Hydroxy Crme  Eucerin Dry  Skin Therapy Plus Alpha Hydroxy Lotion  Eucerin Original Crme  Eucerin Original Lotion  Eucerin Plus Crme Eucerin Plus Lotion  Eucerin TriLipid Replenishing Lotion  Keri Anti-Bacterial Hand Lotion  Keri Deep Conditioning Original Lotion Dry Skin Formula Softly Scented  Keri Deep Conditioning Original Lotion, Fragrance Free Sensitive Skin Formula  Keri Lotion Fast Absorbing Fragrance Free Sensitive Skin Formula  Keri Lotion Fast Absorbing Softly Scented Dry Skin Formula  Keri Original Lotion  Keri Skin Renewal Lotion Keri Silky Smooth Lotion  Keri Silky Smooth Sensitive Skin Lotion  Nivea Body Creamy Conditioning Oil  Nivea Body Extra Enriched Lotion  Nivea Body Original Lotion  Nivea Body Sheer Moisturizing Lotion Nivea Crme  Nivea Skin Firming Lotion  NutraDerm 30 Skin Lotion  NutraDerm Skin Lotion  NutraDerm Therapeutic Skin Cream  NutraDerm Therapeutic Skin Lotion  ProShield Protective Hand Cream  Provon moisturizing lotion  Please read over the following fact sheets that you were given.

## 2024-07-31 ENCOUNTER — Encounter (HOSPITAL_COMMUNITY)
Admission: RE | Admit: 2024-07-31 | Discharge: 2024-07-31 | Disposition: A | Discharge status: Home / Self Care | Source: Ambulatory Visit | Attending: Neurosurgery | Admitting: Neurosurgery

## 2024-07-31 ENCOUNTER — Other Ambulatory Visit: Payer: Self-pay

## 2024-07-31 ENCOUNTER — Encounter (HOSPITAL_COMMUNITY): Payer: Self-pay

## 2024-07-31 VITALS — BP 139/66 | HR 72 | Temp 97.8°F | Resp 18 | Ht 63.0 in | Wt 151.8 lb

## 2024-07-31 DIAGNOSIS — Z01812 Encounter for preprocedural laboratory examination: Secondary | ICD-10-CM | POA: Insufficient documentation

## 2024-07-31 DIAGNOSIS — K219 Gastro-esophageal reflux disease without esophagitis: Secondary | ICD-10-CM | POA: Insufficient documentation

## 2024-07-31 DIAGNOSIS — D469 Myelodysplastic syndrome, unspecified: Secondary | ICD-10-CM | POA: Insufficient documentation

## 2024-07-31 DIAGNOSIS — Z79899 Other long term (current) drug therapy: Secondary | ICD-10-CM | POA: Insufficient documentation

## 2024-07-31 DIAGNOSIS — E039 Hypothyroidism, unspecified: Secondary | ICD-10-CM | POA: Insufficient documentation

## 2024-07-31 DIAGNOSIS — Z01818 Encounter for other preprocedural examination: Secondary | ICD-10-CM

## 2024-07-31 LAB — SURGICAL PCR SCREEN
MRSA, PCR: NEGATIVE
Staphylococcus aureus: NEGATIVE

## 2024-07-31 NOTE — Progress Notes (Signed)
 PCP - [name] Hardy, MD Cardiologist - denies Hematologist- Rennie, Cindy SAUNDERS, MD   PPM/ICD - denies Device Orders - n/a Rep Notified - n/a  Chest x-ray - denies EKG - n/a Stress Test - denies ECHO - 06/06/2020 Cardiac Cath - denies  Sleep Study - denies CPAP - n/a  Fasting Blood Sugar - no DM Checks Blood Sugar _____ times a day  Last dose of GLP1 agonist- n/a GLP1 instructions: n/a  Blood Thinner Instructions: n/a Aspirin  Instructions: hold for 5 days  ERAS Protcol - no, NPO PRE-SURGERY Ensure or G2- n/a  COVID TEST- n/a   Anesthesia review: yes, active cancer treatment; clearance from oncologist   Patient denies shortness of breath, fever, cough and chest pain at PAT appointment   All instructions explained to the patient, with a verbal understanding of the material. Patient agrees to go over the instructions while at home for a better understanding. Patient also instructed to self quarantine after being tested for COVID-19. The opportunity to ask questions was provided.

## 2024-08-01 NOTE — Progress Notes (Signed)
 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   Pt evaluated by cardiology in 2023 for palpitations.  Event monitor with no arrhythmias, NSR.  Echo September 2021 normal ejection fraction, EF 60%, no significant valvular heart disease at that time.  Patient suffered a left humeral fracture secondary to mechanical fall in June 2025.  She underwent left shoulder reverse arthroplasty 03/21/2024 without complication.  CMP and CBC 07/19/2024 reviewed, creatinine mildly elevated 1.13, mild chronic anemia hemoglobin 10.2, WBC chronically elevated 13.2.  EKG 01/28/2022: NSR.  Rate 64.  TTE 06/06/2020 1. Left ventricular ejection fraction, by estimation, is 60 to 65%. The  left ventricle has normal function. The left ventricle has no regional  wall motion abnormalities. Left ventricular diastolic parameters are  consistent with Grade I diastolic  dysfunction (impaired relaxation).   2. Right ventricular systolic function is normal. The right ventricular  size is normal. There is normal pulmonary artery systolic pressure. The  estimated right ventricular systolic pressure is 31.0 mmHg.      Lynwood Geofm RIGGERS [facility] Short Stay Center/Anesthesiology Phone [phone] 08/01/2024 4:25 PM

## 2024-08-01 NOTE — Anesthesia Preprocedure Evaluation (Addendum)
 Anesthesia Evaluation  Patient identified by MRN, date of birth, ID band Patient awake    Reviewed: Allergy & Precautions, NPO status , Patient's Chart, lab work & pertinent test results  History of Anesthesia Complications (+) PONV and history of anesthetic complications  Airway Mallampati: II  TM Distance: >3 FB Neck ROM: Full    Dental no notable dental hx.    Pulmonary neg pulmonary ROS   Pulmonary exam normal        Cardiovascular negative cardio ROS Normal cardiovascular exam     Neuro/Psych  Neuromuscular disease    GI/Hepatic negative GI ROS,,,(+)     substance abuse    Endo/Other  Hypothyroidism    Renal/GU CRFRenal disease     Musculoskeletal  (+) Arthritis ,  narcotic dependent  Abdominal   Peds  Hematology  (+) Blood dyscrasia, anemia   Anesthesia Other Findings SPONDYLOLISTHESIS, LUMBAR REGION  Reproductive/Obstetrics                              Anesthesia Physical Anesthesia Plan  ASA: 2  Anesthesia Plan: General   Post-op Pain Management:    Induction: Intravenous  PONV Risk Score and Plan: 4 or greater and Ondansetron , Dexamethasone , Propofol  infusion, Treatment [place] vary due to age or medical condition and Amisulpride   Airway Management Planned: Oral ETT  Additional Equipment:   Intra-op Plan:   Post-operative Plan: Extubation in OR  Informed Consent: I have reviewed the patients History and Physical, chart, labs and discussed the procedure including the risks, benefits and alternatives for the proposed anesthesia with the patient or authorized representative who has indicated his/her understanding and acceptance.     Dental advisory given  Plan Discussed with: CRNA  Anesthesia Plan Comments: (PAT note by Lynwood Hope, [name]: 73 year old female with pertinent history including PONV, GERD on PPI, hypothyroidism, low grade myelodysplastic syndrome  (maintained on Aranesp  and Reblozyl ), osteoarthritis/polyarthralgia.  Pt evaluated by cardiology in 2023 for palpitations.  Event monitor with no arrhythmias, NSR.  Echo September 2021 normal ejection fraction, EF 60%, no significant valvular heart disease at that time.  Patient suffered a left humeral fracture secondary to mechanical fall in June 2025.  She underwent left shoulder reverse arthroplasty 03/21/2024 without complication.  CMP and CBC 07/19/2024 reviewed, creatinine mildly elevated 1.13, mild chronic anemia hemoglobin 10.2, WBC chronically elevated 13.2.  EKG 01/28/2022: NSR.  Rate 64.  TTE 06/06/2020 1. Left ventricular ejection fraction, by estimation, is 60 to 65%. The  left ventricle has normal function. The left ventricle has no regional  wall motion abnormalities. Left ventricular diastolic parameters are  consistent with Grade I diastolic  dysfunction (impaired relaxation).  2. Right ventricular systolic function is normal. The right ventricular  size is normal. There is normal pulmonary artery systolic pressure. The  estimated right ventricular systolic pressure is 31.0 mmHg.    )         Anesthesia Quick Evaluation

## 2024-08-06 ENCOUNTER — Ambulatory Visit (HOSPITAL_COMMUNITY): Admitting: Anesthesiology

## 2024-08-06 ENCOUNTER — Ambulatory Visit (HOSPITAL_COMMUNITY)

## 2024-08-06 ENCOUNTER — Encounter (HOSPITAL_COMMUNITY)
Admission: RE | Disposition: A | Discharge status: Home / Self Care | Payer: Self-pay | Source: Home / Self Care | Attending: Neurosurgery

## 2024-08-06 ENCOUNTER — Inpatient Hospital Stay (HOSPITAL_COMMUNITY)
Admission: RE | Admit: 2024-08-06 | Discharge: 2024-08-07 | DRG: 402 | Disposition: A | Discharge status: Home / Self Care | Attending: Neurosurgery | Admitting: Neurosurgery

## 2024-08-06 ENCOUNTER — Ambulatory Visit (HOSPITAL_COMMUNITY): Payer: Self-pay | Admitting: Physician Assistant

## 2024-08-06 DIAGNOSIS — M4316 Spondylolisthesis, lumbar region: Secondary | ICD-10-CM | POA: Diagnosis not present

## 2024-08-06 DIAGNOSIS — E039 Hypothyroidism, unspecified: Secondary | ICD-10-CM | POA: Diagnosis present

## 2024-08-06 DIAGNOSIS — Z96612 Presence of left artificial shoulder joint: Secondary | ICD-10-CM | POA: Diagnosis present

## 2024-08-06 DIAGNOSIS — Z8261 Family history of arthritis: Secondary | ICD-10-CM

## 2024-08-06 DIAGNOSIS — M4726 Other spondylosis with radiculopathy, lumbar region: Secondary | ICD-10-CM | POA: Diagnosis present

## 2024-08-06 DIAGNOSIS — Z0389 Encounter for observation for other suspected diseases and conditions ruled out: Secondary | ICD-10-CM | POA: Diagnosis not present

## 2024-08-06 DIAGNOSIS — M5116 Intervertebral disc disorders with radiculopathy, lumbar region: Secondary | ICD-10-CM | POA: Diagnosis not present

## 2024-08-06 DIAGNOSIS — M48062 Spinal stenosis, lumbar region with neurogenic claudication: Secondary | ICD-10-CM

## 2024-08-06 DIAGNOSIS — Z885 Allergy status to narcotic agent status: Secondary | ICD-10-CM

## 2024-08-06 DIAGNOSIS — Z7982 Long term (current) use of aspirin: Secondary | ICD-10-CM

## 2024-08-06 DIAGNOSIS — Z8262 Family history of osteoporosis: Secondary | ICD-10-CM

## 2024-08-06 DIAGNOSIS — Z0189 Encounter for other specified special examinations: Secondary | ICD-10-CM | POA: Diagnosis not present

## 2024-08-06 DIAGNOSIS — Z833 Family history of diabetes mellitus: Secondary | ICD-10-CM

## 2024-08-06 DIAGNOSIS — Z79890 Hormone replacement therapy (postmenopausal): Secondary | ICD-10-CM

## 2024-08-06 DIAGNOSIS — Z823 Family history of stroke: Secondary | ICD-10-CM

## 2024-08-06 DIAGNOSIS — Z803 Family history of malignant neoplasm of breast: Secondary | ICD-10-CM

## 2024-08-06 DIAGNOSIS — Z8249 Family history of ischemic heart disease and other diseases of the circulatory system: Secondary | ICD-10-CM

## 2024-08-06 DIAGNOSIS — Z79899 Other long term (current) drug therapy: Secondary | ICD-10-CM

## 2024-08-06 SURGERY — POSTERIOR LUMBAR FUSION 1 LEVEL
Anesthesia: General | Site: Spine Lumbar

## 2024-08-06 MED ORDER — ONDANSETRON HCL 4 MG/2ML IJ SOLN
INTRAMUSCULAR | Status: DC | PRN
  Administered 2024-08-06: 4 mg via INTRAVENOUS

## 2024-08-06 MED ORDER — LACTATED RINGERS IV SOLN: INTRAVENOUS | Status: DC

## 2024-08-06 MED ORDER — ROCURONIUM BROMIDE 10 MG/ML (PF) SYRINGE
PREFILLED_SYRINGE | INTRAVENOUS | Status: DC | PRN
  Administered 2024-08-06: 60 mg via INTRAVENOUS
  Administered 2024-08-06 (×4): 20 mg via INTRAVENOUS

## 2024-08-06 MED ORDER — VASHE WOUND IRRIGATION OPTIME
TOPICAL | Status: DC | PRN
  Administered 2024-08-06: 34 [oz_av] via TOPICAL

## 2024-08-06 MED ORDER — SODIUM CHLORIDE 0.9% FLUSH: 3.0000 mL | INTRAVENOUS | Status: DC | PRN

## 2024-08-06 MED ORDER — CEFAZOLIN SODIUM-DEXTROSE 2-4 GM/100ML-% IV SOLN
2.0000 g | INTRAVENOUS | Status: AC
  Administered 2024-08-06: 2 g via INTRAVENOUS

## 2024-08-06 MED ORDER — BACITRACIN ZINC 500 UNIT/GM EX OINT
TOPICAL_OINTMENT | CUTANEOUS | Status: AC
  Filled 2024-08-06: qty 28.35

## 2024-08-06 MED ORDER — CYCLOBENZAPRINE HCL 10 MG PO TABS: 10.0000 mg | ORAL_TABLET | Freq: Three times a day (TID) | ORAL | Status: DC | PRN

## 2024-08-06 MED ORDER — CHLORHEXIDINE GLUCONATE 0.12 % MT SOLN: 15.0000 mL | Freq: Once | OROMUCOSAL | Status: AC

## 2024-08-06 MED ORDER — KETAMINE HCL 50 MG/5ML IJ SOSY
PREFILLED_SYRINGE | INTRAMUSCULAR | Status: DC | PRN
  Administered 2024-08-06 (×3): 10 mg via INTRAVENOUS

## 2024-08-06 MED ORDER — PHENYLEPHRINE HCL-NACL 20-0.9 MG/250ML-% IV SOLN
INTRAVENOUS | Status: DC | PRN
  Administered 2024-08-06: 40 ug/min via INTRAVENOUS

## 2024-08-06 MED ORDER — THROMBIN 5000 UNITS EX KIT
PACK | CUTANEOUS | Status: AC
  Filled 2024-08-06: qty 1

## 2024-08-06 MED ORDER — ROCURONIUM BROMIDE 10 MG/ML (PF) SYRINGE
PREFILLED_SYRINGE | INTRAVENOUS | Status: AC
Start: 2024-08-06 — End: 2024-08-06
  Filled 2024-08-06: qty 10

## 2024-08-06 MED ORDER — ROCURONIUM BROMIDE 10 MG/ML (PF) SYRINGE
PREFILLED_SYRINGE | INTRAVENOUS | Status: AC
  Filled 2024-08-06: qty 10

## 2024-08-06 MED ORDER — FENTANYL CITRATE (PF) 100 MCG/2ML IJ SOLN
INTRAMUSCULAR | Status: AC
  Filled 2024-08-06: qty 2

## 2024-08-06 MED ORDER — MIDAZOLAM HCL (PF) 2 MG/2ML IJ SOLN
INTRAMUSCULAR | Status: DC | PRN
Start: 2024-08-06 — End: 2024-08-06
  Administered 2024-08-06: 1 mg via INTRAVENOUS

## 2024-08-06 MED ORDER — OXYCODONE HCL 5 MG PO TABS
10.0000 mg | ORAL_TABLET | ORAL | Status: DC | PRN
  Filled 2024-08-06 (×2): qty 2

## 2024-08-06 MED ORDER — FENTANYL CITRATE (PF) 250 MCG/5ML IJ SOLN
INTRAMUSCULAR | Status: DC | PRN
  Administered 2024-08-06: 50 ug via INTRAVENOUS
  Administered 2024-08-06: 100 ug via INTRAVENOUS

## 2024-08-06 MED ORDER — MENTHOL 3 MG MT LOZG: 1.0000 | LOZENGE | OROMUCOSAL | Status: DC | PRN

## 2024-08-06 MED ORDER — ORAL CARE MOUTH RINSE
15.0000 mL | Freq: Once | OROMUCOSAL | Status: AC
Start: 2024-08-06 — End: 2024-08-06

## 2024-08-06 MED ORDER — SODIUM CHLORIDE 0.9% FLUSH: 3.0000 mL | Freq: Two times a day (BID) | INTRAVENOUS | Status: DC

## 2024-08-06 MED ORDER — LEVOTHYROXINE SODIUM 25 MCG PO TABS
25.0000 ug | ORAL_TABLET | Freq: Every day | ORAL | Status: DC
  Administered 2024-08-07: 25 ug via ORAL
  Filled 2024-08-06: qty 1

## 2024-08-06 MED ORDER — AMISULPRIDE (ANTIEMETIC) 5 MG/2ML IV SOLN
INTRAVENOUS | Status: AC
Start: 2024-08-06 — End: 2024-08-06
  Filled 2024-08-06: qty 2

## 2024-08-06 MED ORDER — LIDOCAINE 2% (20 MG/ML) 5 ML SYRINGE
INTRAMUSCULAR | Status: AC
  Filled 2024-08-06: qty 5

## 2024-08-06 MED ORDER — GABAPENTIN 300 MG PO CAPS
300.0000 mg | ORAL_CAPSULE | Freq: Three times a day (TID) | ORAL | Status: DC
  Administered 2024-08-06 – 2024-08-07 (×2): 300 mg via ORAL
  Filled 2024-08-06 (×2): qty 1

## 2024-08-06 MED ORDER — METHOCARBAMOL 500 MG PO TABS
500.0000 mg | ORAL_TABLET | Freq: Three times a day (TID) | ORAL | Status: DC
  Administered 2024-08-06 – 2024-08-07 (×2): 500 mg via ORAL
  Filled 2024-08-06 (×2): qty 1

## 2024-08-06 MED ORDER — DEXAMETHASONE SOD PHOSPHATE PF 10 MG/ML IJ SOLN
INTRAMUSCULAR | Status: DC | PRN
  Administered 2024-08-06: 10 mg via INTRAVENOUS

## 2024-08-06 MED ORDER — BUPIVACAINE LIPOSOME 1.3 % IJ SUSP
INTRAMUSCULAR | Status: DC | PRN
  Administered 2024-08-06: 20 mL

## 2024-08-06 MED ORDER — ACETAMINOPHEN 10 MG/ML IV SOLN: 1000.0000 mg | Freq: Once | INTRAVENOUS | Status: DC | PRN

## 2024-08-06 MED ORDER — ONDANSETRON HCL 4 MG/2ML IJ SOLN
INTRAMUSCULAR | Status: AC
  Filled 2024-08-06: qty 2

## 2024-08-06 MED ORDER — BUPIVACAINE-EPINEPHRINE (PF) 0.5% -1:200000 IJ SOLN
INTRAMUSCULAR | Status: AC
  Filled 2024-08-06: qty 30

## 2024-08-06 MED ORDER — ACETAMINOPHEN 500 MG PO TABS
1000.0000 mg | ORAL_TABLET | Freq: Four times a day (QID) | ORAL | Status: DC
  Administered 2024-08-06 – 2024-08-07 (×2): 1000 mg via ORAL
  Filled 2024-08-06 (×3): qty 2

## 2024-08-06 MED ORDER — CHLORHEXIDINE GLUCONATE CLOTH 2 % EX PADS: 6.0000 | MEDICATED_PAD | Freq: Once | CUTANEOUS | Status: DC

## 2024-08-06 MED ORDER — CHLORHEXIDINE GLUCONATE CLOTH 2 % EX PADS
6.0000 | MEDICATED_PAD | Freq: Once | CUTANEOUS | Status: AC
  Administered 2024-08-06: 6 via TOPICAL

## 2024-08-06 MED ORDER — PHENOL 1.4 % MT LIQD: 1.0000 | OROMUCOSAL | Status: DC | PRN

## 2024-08-06 MED ORDER — ACETAMINOPHEN 325 MG PO TABS: 650.0000 mg | ORAL_TABLET | ORAL | Status: DC | PRN

## 2024-08-06 MED ORDER — BUPIVACAINE LIPOSOME 1.3 % IJ SUSP
INTRAMUSCULAR | Status: AC
  Filled 2024-08-06: qty 20

## 2024-08-06 MED ORDER — PROPOFOL 10 MG/ML IV BOLUS
INTRAVENOUS | Status: AC
  Filled 2024-08-06: qty 20

## 2024-08-06 MED ORDER — AMISULPRIDE (ANTIEMETIC) 5 MG/2ML IV SOLN: 10.0000 mg | Freq: Once | INTRAVENOUS | Status: DC | PRN

## 2024-08-06 MED ORDER — FENTANYL CITRATE (PF) 250 MCG/5ML IJ SOLN
INTRAMUSCULAR | Status: AC
  Filled 2024-08-06: qty 5

## 2024-08-06 MED ORDER — ONDANSETRON HCL 4 MG/2ML IJ SOLN: 4.0000 mg | Freq: Four times a day (QID) | INTRAMUSCULAR | Status: DC | PRN

## 2024-08-06 MED ORDER — MIDAZOLAM HCL 2 MG/2ML IJ SOLN
INTRAMUSCULAR | Status: AC
  Filled 2024-08-06: qty 2

## 2024-08-06 MED ORDER — ZOLPIDEM TARTRATE 5 MG PO TABS
5.0000 mg | ORAL_TABLET | Freq: Every evening | ORAL | Status: DC | PRN
Start: 2024-08-06 — End: 2024-08-07

## 2024-08-06 MED ORDER — THROMBIN 5000 UNITS EX SOLR
OROMUCOSAL | Status: DC | PRN
  Administered 2024-08-06: 5 mL via TOPICAL

## 2024-08-06 MED ORDER — PROPOFOL 1000 MG/100ML IV EMUL
INTRAVENOUS | Status: AC
Start: 2024-08-06 — End: 2024-08-06
  Filled 2024-08-06: qty 100

## 2024-08-06 MED ORDER — PANTOPRAZOLE SODIUM 40 MG PO TBEC
40.0000 mg | DELAYED_RELEASE_TABLET | Freq: Every day | ORAL | Status: DC
  Administered 2024-08-07: 40 mg via ORAL
  Filled 2024-08-06: qty 1

## 2024-08-06 MED ORDER — OXYCODONE HCL 5 MG PO TABS
5.0000 mg | ORAL_TABLET | ORAL | Status: DC | PRN
  Administered 2024-08-06 – 2024-08-07 (×4): 5 mg via ORAL
  Filled 2024-08-06 (×3): qty 1

## 2024-08-06 MED ORDER — BISACODYL 10 MG RE SUPP: 10.0000 mg | Freq: Every day | RECTAL | Status: DC | PRN

## 2024-08-06 MED ORDER — DOCUSATE SODIUM 100 MG PO CAPS
100.0000 mg | ORAL_CAPSULE | Freq: Two times a day (BID) | ORAL | Status: DC
  Administered 2024-08-06 – 2024-08-07 (×2): 100 mg via ORAL
  Filled 2024-08-06 (×2): qty 1

## 2024-08-06 MED ORDER — AMISULPRIDE (ANTIEMETIC) 5 MG/2ML IV SOLN
INTRAVENOUS | Status: DC | PRN
  Administered 2024-08-06: 5 mg via INTRAVENOUS

## 2024-08-06 MED ORDER — KETAMINE HCL 50 MG/5ML IJ SOSY
PREFILLED_SYRINGE | INTRAMUSCULAR | Status: AC
  Filled 2024-08-06: qty 5

## 2024-08-06 MED ORDER — ONDANSETRON HCL 4 MG/2ML IJ SOLN: 4.0000 mg | Freq: Once | INTRAMUSCULAR | Status: DC | PRN

## 2024-08-06 MED ORDER — SUGAMMADEX SODIUM 200 MG/2ML IV SOLN
INTRAVENOUS | Status: DC | PRN
Start: 2024-08-06 — End: 2024-08-06
  Administered 2024-08-06: 200 mg via INTRAVENOUS

## 2024-08-06 MED ORDER — BACITRACIN ZINC 500 UNIT/GM EX OINT
TOPICAL_OINTMENT | CUTANEOUS | Status: DC | PRN
  Administered 2024-08-06: 1 via TOPICAL

## 2024-08-06 MED ORDER — ACETAMINOPHEN 650 MG RE SUPP: 650.0000 mg | RECTAL | Status: DC | PRN

## 2024-08-06 MED ORDER — SODIUM CHLORIDE 0.9 % IV SOLN: 250.0000 mL | INTRAVENOUS | Status: DC

## 2024-08-06 MED ORDER — 0.9 % SODIUM CHLORIDE (POUR BTL) OPTIME
TOPICAL | Status: DC | PRN
  Administered 2024-08-06: 1000 mL

## 2024-08-06 MED ORDER — LIDOCAINE 2% (20 MG/ML) 5 ML SYRINGE
INTRAMUSCULAR | Status: DC | PRN
  Administered 2024-08-06: 100 mg via INTRAVENOUS

## 2024-08-06 MED ORDER — CEFAZOLIN SODIUM-DEXTROSE 2-4 GM/100ML-% IV SOLN
2.0000 g | Freq: Three times a day (TID) | INTRAVENOUS | Status: AC
  Administered 2024-08-06 – 2024-08-07 (×2): 2 g via INTRAVENOUS
  Filled 2024-08-06 (×2): qty 100

## 2024-08-06 MED ORDER — CEFAZOLIN SODIUM-DEXTROSE 2-4 GM/100ML-% IV SOLN
INTRAVENOUS | Status: AC
  Filled 2024-08-06: qty 100

## 2024-08-06 MED ORDER — CHLORHEXIDINE GLUCONATE 0.12 % MT SOLN
OROMUCOSAL | Status: AC
  Administered 2024-08-06: 15 mL via OROMUCOSAL
  Filled 2024-08-06: qty 15

## 2024-08-06 MED ORDER — PROPOFOL 10 MG/ML IV BOLUS
INTRAVENOUS | Status: DC | PRN
  Administered 2024-08-06: 50 ug/kg/min via INTRAVENOUS
  Administered 2024-08-06: 100 mg via INTRAVENOUS

## 2024-08-06 MED ORDER — MORPHINE SULFATE (PF) 2 MG/ML IV SOLN: 2.0000 mg | INTRAVENOUS | Status: DC | PRN

## 2024-08-06 MED ORDER — BUPIVACAINE-EPINEPHRINE (PF) 0.5% -1:200000 IJ SOLN
INTRAMUSCULAR | Status: DC | PRN
Start: 2024-08-06 — End: 2024-08-06
  Administered 2024-08-06: 10 mL

## 2024-08-06 MED ORDER — ONDANSETRON HCL 4 MG PO TABS: 4.0000 mg | ORAL_TABLET | Freq: Four times a day (QID) | ORAL | Status: DC | PRN

## 2024-08-06 MED ORDER — FENTANYL CITRATE (PF) 100 MCG/2ML IJ SOLN
25.0000 ug | INTRAMUSCULAR | Status: DC | PRN
  Administered 2024-08-06: 25 ug via INTRAVENOUS

## 2024-08-06 SURGICAL SUPPLY — 59 items
BAG COUNTER SPONGE SURGICOUNT (BAG) ×1 IMPLANT
BASKET BONE COLLECTION (BASKET) ×1 IMPLANT
BENZOIN TINCTURE PRP APPL 2/3 (GAUZE/BANDAGES/DRESSINGS) ×1 IMPLANT
BLADE CLIPPER SURG (BLADE) IMPLANT
BUR MATCHSTICK NEURO 3.0 LAGG (BURR) ×1 IMPLANT
BUR PRECISION FLUTE 6.0 (BURR) ×1 IMPLANT
CAGE ALTERA 10X31X9-13 15D (Cage) IMPLANT
CANISTER SUCTION 3000ML PPV (SUCTIONS) ×1 IMPLANT
CAP LOCK DLX THRD (Cap) IMPLANT
CLEANSER WND VASHE INSTL 34OZ (WOUND CARE) ×1 IMPLANT
CNTNR URN SCR LID CUP LEK RST (MISCELLANEOUS) ×1 IMPLANT
COVER BACK TABLE 60X90IN (DRAPES) ×1 IMPLANT
DRAPE C-ARM 42X72 X-RAY (DRAPES) ×2 IMPLANT
DRAPE HALF SHEET 40X57 (DRAPES) ×1 IMPLANT
DRAPE LAPAROTOMY 100X72X124 (DRAPES) ×1 IMPLANT
DRAPE SURG 17X23 STRL (DRAPES) ×1 IMPLANT
DRSG OPSITE POSTOP 4X6 (GAUZE/BANDAGES/DRESSINGS) ×1 IMPLANT
DRSG OPSITE POSTOP 4X8 (GAUZE/BANDAGES/DRESSINGS) IMPLANT
ELECTRODE BLDE 4.0 EZ CLN MEGD (MISCELLANEOUS) ×1 IMPLANT
ELECTRODE REM PT RTRN 9FT ADLT (ELECTROSURGICAL) ×1 IMPLANT
EVACUATOR 1/8 PVC DRAIN (DRAIN) ×1 IMPLANT
GAUZE 4X4 16PLY ~~LOC~~+RFID DBL (SPONGE) ×1 IMPLANT
GLOVE BIO SURGEON STRL SZ 6 (GLOVE) ×1 IMPLANT
GLOVE BIO SURGEON STRL SZ8 (GLOVE) ×2 IMPLANT
GLOVE BIO SURGEON STRL SZ8.5 (GLOVE) ×2 IMPLANT
GLOVE BIOGEL PI IND STRL 6.5 (GLOVE) ×1 IMPLANT
GOWN STRL REUS W/ TWL LRG LVL3 (GOWN DISPOSABLE) ×1 IMPLANT
GOWN STRL REUS W/ TWL XL LVL3 (GOWN DISPOSABLE) ×2 IMPLANT
GOWN STRL REUS W/TWL 2XL LVL3 (GOWN DISPOSABLE) IMPLANT
HEMOSTAT POWDER KIT SURGIFOAM (HEMOSTASIS) ×1 IMPLANT
KIT BASIN OR (CUSTOM PROCEDURE TRAY) ×1 IMPLANT
KIT GRAFTMAG DEL NEURO DISP (NEUROSURGERY SUPPLIES) IMPLANT
KIT POSITIONER JACKSON TABLE (MISCELLANEOUS) ×1 IMPLANT
KIT TURNOVER KIT B (KITS) ×1 IMPLANT
NDL HYPO 21X1.5 SAFETY (NEEDLE) ×1 IMPLANT
NDL HYPO 22X1.5 SAFETY MO (MISCELLANEOUS) ×1 IMPLANT
NEEDLE HYPO 21X1.5 SAFETY (NEEDLE) ×2 IMPLANT
NEEDLE HYPO 22X1.5 SAFETY MO (MISCELLANEOUS) ×1 IMPLANT
PACK LAMINECTOMY NEURO (CUSTOM PROCEDURE TRAY) ×1 IMPLANT
PAD ARMBOARD POSITIONER FOAM (MISCELLANEOUS) ×3 IMPLANT
PATTIES SURGICAL .5 X1 (DISPOSABLE) IMPLANT
PUTTY DBM INSTAFILL CART 5CC (Putty) IMPLANT
ROD CURVED TI 6.35X35 (Rod) IMPLANT
SCREW PA DLX CREO 7.5X50 (Screw) IMPLANT
SCREW PA DLX CREO 7.5X55 (Screw) IMPLANT
SOLN 0.9% NACL POUR BTL 1000ML (IV SOLUTION) ×1 IMPLANT
SOLN STERILE WATER BTL 1000 ML (IV SOLUTION) ×1 IMPLANT
SPIKE FLUID TRANSFER (MISCELLANEOUS) ×1 IMPLANT
SPONGE NEURO XRAY DETECT 1X3 (DISPOSABLE) IMPLANT
SPONGE SURGIFOAM ABS GEL 100 (HEMOSTASIS) IMPLANT
SPONGE T-LAP 4X18 ~~LOC~~+RFID (SPONGE) IMPLANT
STRIP CLOSURE SKIN 1/2X4 (GAUZE/BANDAGES/DRESSINGS) ×1 IMPLANT
SUT VIC AB 1 CT1 18XBRD ANBCTR (SUTURE) ×2 IMPLANT
SUT VIC AB 2-0 CP2 18 (SUTURE) ×2 IMPLANT
SYR 20ML LL LF (SYRINGE) IMPLANT
SYR 30ML SLIP (SYRINGE) IMPLANT
TOWEL GREEN STERILE (TOWEL DISPOSABLE) ×1 IMPLANT
TOWEL GREEN STERILE FF (TOWEL DISPOSABLE) ×1 IMPLANT
TRAY FOLEY MTR SLVR 16FR STAT (SET/KITS/TRAYS/PACK) ×1 IMPLANT

## 2024-08-06 NOTE — Anesthesia Postprocedure Evaluation (Signed)
 Anesthesia Post Note  Patient: Carla [name]  Procedure(s) Performed: POSTERIOR LUMBAR FUSION LUMBAR FOUR-FIVE (Spine Lumbar)     Patient location during evaluation: PACU Anesthesia Type: General Level of consciousness: awake and alert Pain management: pain level controlled Vital Signs Assessment: post-procedure vital signs reviewed and stable Respiratory status: spontaneous breathing, nonlabored ventilation, respiratory function stable and patient connected to nasal cannula oxygen Cardiovascular status: blood pressure returned to baseline and stable Postop Assessment: no apparent nausea or vomiting Anesthetic complications: no   No notable events documented.  Last Vitals:  Vitals:   08/06/24 1816 08/06/24 1926  BP: 127/62 128/66  Pulse: 99 87  Resp: 19 16  Temp: 37.1 C 36.7 C  SpO2: 92% 93%    Last Pain:  Vitals:   08/06/24 1933  TempSrc:   PainSc: 10-Worst pain ever                 Cevin Rubinstein

## 2024-08-06 NOTE — Progress Notes (Signed)
 Orthopedic Tech Progress Note Patient Details:  Carla [name] 09-24-1951 [id number]  PACU RN stated patient has back brace  Patient ID: Carla [name], female   DOB: 09-06-1951, 73 y.o.   MRN: [id number]  [name] Pac 08/06/2024, 5:37 PM

## 2024-08-06 NOTE — Op Note (Signed)
 Brief history: The patient is a 73 year old white female who has complained of back and bilateral leg pain consistent with neurogenic claudication.  She failed medical management and was worked up with lumbar x-rays and lumbar MRI which demonstrated a lumbar spine listhesis and spinal stenosis.  I discussed the various treatment options with her.  She has decided proceed with surgery.  Preoperative diagnosis: Lumbar spondylolisthesis, lumbar facet arthropathy, lumbar degenerative disc disease, spinal stenosis compressing both the L4 and the L5 nerve roots; lumbago; lumbar radiculopathy; neurogenic claudication  Postoperative diagnosis: The same  Procedure: Bilateral L4-5 laminotomy/foraminotomies/medial facetectomy to decompress the bilateral L4 and L5 nerve roots(the work required to do this was in addition to the work required to do the posterior lumbar interbody fusion because of the patient's spinal stenosis, facet arthropathy. Etc. requiring a wide decompression of the nerve roots.); L4-5 transforaminal lumbar interbody fusion with local morselized autograft bone and r DBM; insertion of interbody prosthesis at L4-5 (globus peek expandable interbody prosthesis); posterior nonsegmental instrumentation from L4 to L5 with globus titanium pedicle screws and rods; posterior lateral arthrodesis at L4-5 with local morselized autograft bone and Zimmer DBM.  Surgeon: Dr. Chyrl Budge  Asst.: Duwaine Beck, NP  Anesthesia: Gen. endotracheal  Estimated blood loss: 200 cc  Drains: Medium Hemovac drain in the epidural space  Complications: None  Description of procedure: The patient was brought to the operating room by the anesthesia team. General endotracheal anesthesia was induced. The patient was turned to the prone position on the Wilson frame. The patient's lumbosacral region was then prepared with Betadine scrub and Betadine solution. Sterile drapes were applied.  I then injected the area to be  incised with Marcaine  with epinephrine solution. I then used the scalpel to make a linear midline incision over the L4-5 interspace. I then used electrocautery to perform a bilateral subperiosteal dissection exposing the spinous process and lamina of L4-5. We then obtained intraoperative radiograph to confirm our location. We then inserted the Verstrac retractor to provide exposure.  I began the decompression by using the high speed drill to perform laminotomies at L4-5 bilaterally. We then used the Kerrison punches to widen the laminotomy and removed the ligamentum flavum at L4-5 bilaterally. We used the Kerrison punches to remove the medial facets at L4-5 bilaterally, we removed the right L4-5 facet. We performed wide foraminotomies about the bilateral L4 and L5 nerve roots completing the decompression.  We now turned our attention to the posterior lumbar interbody fusion. I used a scalpel to incise the intervertebral disc at L4-5 bilaterally. I then performed a partial intervertebral discectomy at L4-5 bilaterally using the pituitary forceps. We prepared the vertebral endplates at L4-5 bilaterally for the fusion by removing the soft tissues with the curettes. We then used the trial spacers to pick the appropriate sized interbody prosthesis. We prefilled his prosthesis with a combination of local morselized autograft bone that we obtained during the decompression as well as Zimmer DBM. We inserted the prefilled prosthesis into the interspace at L4-5 from the right, we then turned and expanded the prosthesis. There was a good snug fit of the prosthesis in the interspace. We then filled and the remainder of the intervertebral disc space with local morselized autograft bone and Zimmer DBM. This completed the posterior lumbar interbody arthrodesis.  During the decompression and insertion of the prosthesis the assistant protected the thecal sac and nerve roots with the D'Errico retractor.  We now turned attention  to the instrumentation. Under fluoroscopic guidance we  cannulated the bilateral L4 and L5 pedicles with the bone probe. We then removed the bone probe. We then tapped the pedicle with a 6.5 millimeter tap. We then removed the tap. We probed inside the tapped pedicle with a ball probe to rule out cortical breaches. We then inserted a 7.5 x 50 and 55 millimeter pedicle screw into the L4 and L5 pedicles bilaterally under fluoroscopic guidance. We then palpated along the medial aspect of the pedicles to rule out cortical breaches. There were none. The nerve roots were not injured. We then connected the unilateral pedicle screws with a lordotic rod. We compressed the construct and secured the rod in place with the caps. We then tightened the caps appropriately. This completed the instrumentation from L4-5 bilaterally.  We now turned our attention to the posterior lateral arthrodesis at L4-5. We used the high-speed drill to decorticate the remainder of the facets, pars, transverse process at L4-5. We then applied a combination of local morselized autograft bone and Zimmer DBM over these decorticated posterior lateral structures. This completed the posterior lateral arthrodesis.  We then obtained hemostasis using bipolar electrocautery. We irrigated the wound out with vashe solution. We inspected the thecal sac and nerve roots and noted they were well decompressed. We then removed the retractor.  We injected Exparel  .  We placed a medium Hemovac drain in the epidural space and tunneled it out through a separate stab wound.  We reapproximated patient's thoracolumbar fascia with interrupted #1 Vicryl suture. We reapproximated patient's subcutaneous tissue with interrupted 2-0 Vicryl suture. The reapproximated patient's skin with Steri-Strips and benzoin. The wound was then coated with bacitracin ointment. A sterile dressing was applied. The drapes were removed. The patient was subsequently returned to the supine position  where they were extubated by the anesthesia team. He was then transported to the post anesthesia care unit in stable condition. All sponge instrument and needle counts were reportedly correct at the end of this case.

## 2024-08-06 NOTE — H&P (Signed)
 Subjective: The patient is a 73 year old white female who has complained of back and left greater right buttock and leg pain consistent with neurogenic claudication.  She failed medical management.  She was worked up with lumbar x-rays and lumbar MRI which demonstrated L4-5 spinal stenosis and spinal stenosis.  I discussed the various treatment options with her.  She has decided to proceed with surgery.  Past Medical History:  Diagnosis Date   Allergic rhinitis, cause unspecified    Anemia, unspecified    Arthritis    Cancer (HCC)    myelodysplastic syndrome   Carpal tunnel syndrome    Cataract 2007/2010   Cystitis, unspecified    Family history of osteoporosis    Hypothyroidism    PONV (postoperative nausea and vomiting)    Postnasal drip    Syncope and collapse     Past Surgical History:  Procedure Laterality Date   BONE MARROW BIOPSY     CARPAL TUNNEL RELEASE Bilateral    COLONOSCOPY  09/28/2003   COLONOSCOPY WITH PROPOFOL  N/A 07/31/2015   Procedure: COLONOSCOPY WITH PROPOFOL ;  Surgeon: Lamar Bunk, MD;  Location: [facility];  Service: Endoscopy;  Laterality: N/A;   DIAGNOSTIC LAPAROSCOPY     dx lack of pregnancy    EYE SURGERY     lasik, cataract, prk,yag procedure   REVERSE SHOULDER ARTHROPLASTY Left 03/21/2024   Procedure: ARTHROPLASTY, SHOULDER, TOTAL, REVERSE FOR FRACTURE;  Surgeon: Cristy Bonner DASEN, MD;  Location: [facility];  Service: Orthopedics;  Laterality: Left;   TONSILLECTOMY  1958    Allergies  Allergen Reactions   Codeine Nausea Only    Social History   Tobacco Use   Smoking status: Never   Smokeless tobacco: Never  Substance Use Topics   Alcohol use: Not Currently    Comment: Rare    Family History  Problem Relation Age of Onset   Osteoporosis Mother    Stroke Mother    Arthritis Mother    Coronary artery disease Father    Stroke Father 64   Arthritis Father    Heart disease Father    Diabetes Other        Aunts and uncles   Breast cancer  Paternal Aunt    Breast cancer Maternal Aunt    Arthritis/Rheumatoid Child    Breast cancer Cousin    Cancer Maternal Aunt    Cancer Maternal Aunt    Cancer Paternal Aunt    Diabetes Paternal Aunt    Diabetes Sister    Diabetes Paternal Uncle    Heart disease Paternal Uncle    Stroke Paternal Uncle    Diabetes Paternal Aunt    Stroke Paternal Aunt    Prior to Admission medications   Medication Sig Start Date End Date Taking? Authorizing Provider  acetaminophen  (TYLENOL ) 500 MG tablet Take 1,000 mg by mouth 3 (three) times daily as needed for headache or fever (pain).   Yes [provider]  celecoxib  (CELEBREX ) 100 MG capsule Take 100 mg by mouth 2 (two) times daily as needed (pain). 03/21/24  Yes [provider]  Darbepoetin Alfa  (ARANESP ) 500 MCG/ML SOSY injection Inject 500 mcg into the skin See admin instructions. Inject 500 mcg into the skin every week as needed   Yes [provider]  docusate sodium (COLACE) 100 MG capsule Take 100 mg by mouth daily as needed.   Yes [provider]  gabapentin  (NEURONTIN ) 300 MG capsule Take 300 mg by mouth 3 (three) times daily.   Yes [provider]  HYDROcodone -acetaminophen  (NORCO/VICODIN) 5-325 MG tablet Take 1 tablet by mouth every 6 (six) hours as needed (pain not relieved by Perocet).   Yes [provider]  levothyroxine  (SYNTHROID ) 25 MCG tablet TAKE ONE TAB BY MOUTH ONCE DAILY. TAKE ON AN EMPTY STOMACH WITH A GLASS OF WATER ATLEAST 30-60 MINUTES BEFORE BREAKFAST 07/24/24  Yes [name], [name], MD  luspatercept -aamt (REBLOZYL ) 75 MG subcutaneous injection Inject into the skin every 28 (twenty-eight) days.   Yes [provider]  methocarbamol (ROBAXIN) 500 MG tablet Take 500 mg by mouth 3 (three) times daily. 04/20/24  Yes [provider]  omeprazole  (PRILOSEC) 20 MG capsule Take 1 capsule (20 mg total) by mouth daily. 07/25/24  Yes [name], [name], MD  oxyCODONE -acetaminophen   (PERCOCET/ROXICET) 5-325 MG tablet Take 1 tablet by mouth every 4 (four) hours as needed for severe pain (pain score 7-10).   Yes [provider]  promethazine  (PHENERGAN ) 25 MG tablet Take 1 tablet (25 mg total) by mouth every 6 (six) hours as needed for nausea or vomiting. Patient taking differently: Take 12.5 mg by mouth every 6 (six) hours. 03/21/24  Yes McBane, [name], [name]  traMADol  (ULTRAM ) 50 MG tablet Take 50 mg by mouth every 6 (six) hours as needed (pain).   Yes [provider]  Vitamin D , Ergocalciferol , (DRISDOL ) 1.25 MG (50000 UNIT) CAPS capsule TAKE 1 CAPSULES BY MOUTH ONCE A WEEK Patient taking differently: Take 50,000 Units by mouth every Friday. 05/07/24  Yes Rennie Cindy SAUNDERS, MD  ASPIRIN  LOW DOSE 81 MG chewable tablet Chew 81 mg by mouth daily. Patient not taking: Reported on 08/06/2024 03/21/24   [provider]     Review of Systems  Positive ROS: As above  All other systems have been reviewed and were otherwise negative with the exception of those mentioned in the HPI and as above.  Objective: Vital signs in last 24 hours: Temp:  [98.7 F (37.1 C)] 98.7 F (37.1 C) (11/10 1056) Pulse Rate:  [87] 87 (11/10 1056) Resp:  [20] 20 (11/10 1056) BP: (145)/(70) 145/70 (11/10 1056) SpO2:  [92 %] 92 % (11/10 1056) Weight:  [70.3 kg] 70.3 kg (11/10 1056) Estimated body mass index is 27.46 kg/m as calculated from the following:   Height as of this encounter: 5' 3 (1.6 m).   Weight as of this encounter: 70.3 kg.   General Appearance: Alert Head: Normocephalic, without obvious abnormality, atraumatic Eyes: PERRL, conjunctiva/corneas clear, EOM's intact,    Ears: Normal  Throat: Normal  Neck: Supple, Back: unremarkable Lungs: Clear to auscultation bilaterally, respirations unlabored Heart: Regular rate and rhythm, no murmur, rub or gallop Abdomen: Soft, non-tender Extremities: Extremities normal, atraumatic, no cyanosis or  edema Skin: unremarkable  NEUROLOGIC:   Mental status: alert and oriented,Motor Exam - grossly normal Sensory Exam - grossly normal Reflexes:  Coordination - grossly normal Gait - grossly normal Balance - grossly normal Cranial Nerves: I: smell Not tested  II: visual acuity  OS: Normal  OD: Normal   II: visual fields Full to confrontation  II: pupils Equal, round, reactive to light  III,VII: ptosis None  III,IV,VI: extraocular muscles  Full ROM  V: mastication Normal  V: facial light touch sensation  Normal  V,VII: corneal reflex  Present  VII: facial muscle function - upper  Normal  VII: facial muscle function - lower Normal  VIII: hearing Not tested  IX: soft palate elevation  Normal  IX,X: gag reflex Present  XI: trapezius strength  5/5  XI: sternocleidomastoid strength 5/5  XI: neck flexion strength  5/5  XII: tongue strength  Normal    Data Review Lab Results  Component Value Date   WBC 13.2 (H) 07/19/2024   HGB 10.2 (L) 07/19/2024   HCT 32.8 (L) 07/19/2024   MCV 95.6 07/19/2024   PLT 269 07/19/2024   Lab Results  Component Value Date   NA 139 07/19/2024   K 4.5 07/19/2024   CL 110 07/19/2024   CO2 21 (L) 07/19/2024   BUN 26 (H) 07/19/2024   CREATININE 1.13 (H) 07/19/2024   GLUCOSE 110 (H) 07/19/2024   Lab Results  Component Value Date   INR 1.1 12/03/2020    Assessment/Plan: Lumbar spondylolisthesis, lumbar spinal stenosis, neurogenic claudication, lumbar facet arthropathy, lumbago, lumbar radiculopathy: I discussed situation with the patient and her husband.  I reviewed her imaging studies with them and pointed out the abnormalities.  We have discussed the various treatment options including surgery.  I have described the surgical treatment option of an L4-5 decompression, instrumentation and fusion.  I have shown her surgical models.  I have given her a surgical pamphlet.  We have discussed the risk, benefits, alternatives, expected postoperative  course, and likelihood of achieving our goals with surgery.  I have answered all the patient's, and her husband's, questions.  She has decided proceed with surgery.   [name] 08/06/2024 12:09 PM

## 2024-08-06 NOTE — Anesthesia Procedure Notes (Signed)
 Procedure Name: Intubation Date/Time: 08/06/2024 1:03 PM  Performed by: Riku Buttery A, CRNAPre-anesthesia Checklist: Patient identified, Emergency Drugs available, Suction available and Patient being monitored Patient Re-evaluated:Patient Re-evaluated prior to induction Oxygen Delivery Method: Circle System Utilized Preoxygenation: Pre-oxygenation with 100% oxygen Induction Type: IV induction Ventilation: Mask ventilation without difficulty Laryngoscope Size: Mac and 3 Grade View: Grade II Tube type: Oral Tube size: 7.5 mm Number of attempts: 1 Airway Equipment and Method: Stylet and Oral airway Placement Confirmation: ETT inserted through vocal cords under direct vision, positive ETCO2 and breath sounds checked- equal and bilateral Secured at: 22 cm Tube secured with: Tape Dental Injury: Teeth and Oropharynx as per pre-operative assessment

## 2024-08-06 NOTE — Transfer of Care (Signed)
 Immediate Anesthesia Transfer of Care Note  Patient: Carla [name]  Procedure(s) Performed: POSTERIOR LUMBAR FUSION LUMBAR FOUR-FIVE (Spine Lumbar)  Patient Location: PACU  Anesthesia Type:General  Level of Consciousness: sedated  Airway & Oxygen Therapy: Patient Spontanous Breathing and Patient connected to nasal cannula oxygen  Post-op Assessment: Report given to RN and Post -op Vital signs reviewed and stable  Post vital signs: Reviewed and stable  Last Vitals:  Vitals Value Taken Time  BP 130/71 08/06/24 16:56  Temp    Pulse 91 08/06/24 17:00  Resp 13 08/06/24 17:00  SpO2 96 % 08/06/24 17:00  Vitals shown include unfiled device data.  Last Pain:  Vitals:   08/06/24 1133  TempSrc:   PainSc: 8       Patients Stated Pain Goal: 0 (08/06/24 1133)  Complications: No notable events documented.

## 2024-08-07 DIAGNOSIS — Z79890 Hormone replacement therapy (postmenopausal): Secondary | ICD-10-CM | POA: Diagnosis not present

## 2024-08-07 DIAGNOSIS — M4316 Spondylolisthesis, lumbar region: Secondary | ICD-10-CM | POA: Diagnosis present

## 2024-08-07 DIAGNOSIS — M4726 Other spondylosis with radiculopathy, lumbar region: Secondary | ICD-10-CM | POA: Diagnosis present

## 2024-08-07 DIAGNOSIS — Z823 Family history of stroke: Secondary | ICD-10-CM | POA: Diagnosis not present

## 2024-08-07 DIAGNOSIS — Z79899 Other long term (current) drug therapy: Secondary | ICD-10-CM | POA: Diagnosis not present

## 2024-08-07 DIAGNOSIS — E039 Hypothyroidism, unspecified: Secondary | ICD-10-CM | POA: Diagnosis present

## 2024-08-07 DIAGNOSIS — Z8262 Family history of osteoporosis: Secondary | ICD-10-CM | POA: Diagnosis not present

## 2024-08-07 DIAGNOSIS — Z833 Family history of diabetes mellitus: Secondary | ICD-10-CM | POA: Diagnosis not present

## 2024-08-07 DIAGNOSIS — Z885 Allergy status to narcotic agent status: Secondary | ICD-10-CM | POA: Diagnosis not present

## 2024-08-07 DIAGNOSIS — Z803 Family history of malignant neoplasm of breast: Secondary | ICD-10-CM | POA: Diagnosis not present

## 2024-08-07 DIAGNOSIS — Z96612 Presence of left artificial shoulder joint: Secondary | ICD-10-CM | POA: Diagnosis present

## 2024-08-07 DIAGNOSIS — M5116 Intervertebral disc disorders with radiculopathy, lumbar region: Secondary | ICD-10-CM | POA: Diagnosis present

## 2024-08-07 DIAGNOSIS — M48062 Spinal stenosis, lumbar region with neurogenic claudication: Secondary | ICD-10-CM | POA: Diagnosis present

## 2024-08-07 DIAGNOSIS — Z8261 Family history of arthritis: Secondary | ICD-10-CM | POA: Diagnosis not present

## 2024-08-07 DIAGNOSIS — Z8249 Family history of ischemic heart disease and other diseases of the circulatory system: Secondary | ICD-10-CM | POA: Diagnosis not present

## 2024-08-07 DIAGNOSIS — Z7982 Long term (current) use of aspirin: Secondary | ICD-10-CM | POA: Diagnosis not present

## 2024-08-07 MED ORDER — METHOCARBAMOL 500 MG PO TABS: 500.0000 mg | ORAL_TABLET | Freq: Three times a day (TID) | ORAL | 1 refills | Status: AC

## 2024-08-07 MED ORDER — OXYCODONE-ACETAMINOPHEN 5-325 MG PO TABS
1.0000 | ORAL_TABLET | ORAL | Status: DC | PRN
  Administered 2024-08-07: 1 via ORAL
  Filled 2024-08-07: qty 1

## 2024-08-07 MED ORDER — OXYCODONE-ACETAMINOPHEN 5-325 MG PO TABS: 1.0000 | ORAL_TABLET | ORAL | 0 refills | Status: DC | PRN

## 2024-08-07 MED ORDER — DOCUSATE SODIUM 100 MG PO CAPS: 100.0000 mg | ORAL_CAPSULE | Freq: Two times a day (BID) | ORAL | 0 refills | Status: DC

## 2024-08-07 MED FILL — Heparin Sodium (Porcine) Inj 1000 Unit/ML: INTRAMUSCULAR | Qty: 30 | Status: AC

## 2024-08-07 MED FILL — Sodium Chloride IV Soln 0.9%: INTRAVENOUS | Qty: 2000 | Status: AC

## 2024-08-07 NOTE — Evaluation (Signed)
 Physical Therapy Evaluation  Patient Details Name: Carla [name] MRN: [id number] DOB: [birth year] Today's Date: 08/07/2024  History of Present Illness  Pt is a 73 y/o female who presents s/p L4-5 PLIF on 08/06/2024. PMH significant for CA, carpal tunnel release (bilateral), osteoporosis, hypothyroidism, syncope and collapse, RSA.  Clinical Impression  Pt admitted with above diagnosis. At the time of PT eval, pt was able to demonstrate transfers and ambulation with gross CGA and RW for support. Pt was educated on precautions, brace application/wearing schedule, appropriate activity progression, and car transfer. Pt currently with functional limitations due to the deficits listed below (see PT Problem List). Pt will benefit from skilled PT to increase their independence and safety with mobility to allow discharge to the venue listed below.          If plan is discharge home, recommend the following: A little help with walking and/or transfers;A little help with bathing/dressing/bathroom;Assistance with cooking/housework;Assist for transportation;Help with stairs or ramp for entrance   Can travel by private vehicle        Equipment Recommendations None recommended by PT  Recommendations for Other Services       Functional Status Assessment Patient has had a recent decline in their functional status and demonstrates the ability to make significant improvements in function in a reasonable and predictable amount of time.     Precautions / Restrictions Precautions Precautions: Fall;Back Precaution Booklet Issued: Yes (comment) Recall of Precautions/Restrictions: Intact Precaution/Restrictions Comments: Reviewed handout and pt was cued for precautions during functional mobility. Required Braces or Orthoses: Spinal Brace Spinal Brace: Lumbar corset;Applied in sitting position Restrictions Weight Bearing Restrictions Per Provider Order: No      Mobility  Bed Mobility Overal bed mobility:  Needs Assistance Bed Mobility: Sidelying to Sit, Sit to Sidelying   Sidelying to sit: Contact guard assist     Sit to sidelying: Min assist General bed mobility comments: Assist for LE elevation back up into bed. Slow and generally guarded due to pain.    Transfers Overall transfer level: Needs assistance Equipment used: Rolling walker (2 wheels) Transfers: Sit to/from Stand Sit to Stand: Contact guard assist           General transfer comment: VC's for wide BOS and hand placement on seated surface for safety. No assist required but hands on guarding provided for safety.    Ambulation/Gait Ambulation/Gait assistance: Contact guard assist Gait Distance (Feet): 200 Feet Assistive device: Rolling walker (2 wheels) Gait Pattern/deviations: Step-through pattern, Decreased stride length, Trunk flexed Gait velocity: Decreased Gait velocity interpretation: 1.31 - 2.62 ft/sec, indicative of limited community ambulator   General Gait Details: Slow and guarded due to pain. VC's for improved posture, closer walker proximity and forward gaze. No gross unsteadiness or LOB noted.  Stairs            Wheelchair Mobility     Tilt Bed    Modified Rankin (Stroke Patients Only)       Balance Overall balance assessment: Needs assistance Sitting-balance support: Feet supported, No upper extremity supported Sitting balance-Leahy Scale: Fair     Standing balance support: Bilateral upper extremity supported, During functional activity, Reliant on assistive device for balance Standing balance-Leahy Scale: Poor                               Pertinent Vitals/Pain Pain Assessment Pain Assessment: Faces Faces Pain Scale: Hurts even more Pain Location: Incision site Pain Descriptors /  Indicators: Operative site guarding, Sore Pain Intervention(s): Limited activity within patient's tolerance, Monitored during session, Repositioned    Home Living Family/patient expects  to be discharged to:: Private residence Living Arrangements: Spouse/significant other Available Help at Discharge: Family;Available 24 hours/day Type of Home: House Home Access: Ramped entrance       Home Layout: Two level;Able to live on main level with bedroom/bathroom Home Equipment: Rolling Walker (2 wheels);Rollator (4 wheels);BSC/3in1;Shower seat - built in      Prior Function Prior Level of Function : Needs assist       Physical Assist : Mobility (physical);ADLs (physical)     Mobility Comments: Using rollator at all times. Husband assisting as needed for car transfer. ADLs Comments: Husband assisting with ADL's including lower body dressing and bathing.     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    Lower Extremity Assessment Lower Extremity Assessment: Generalized weakness    Cervical / Trunk Assessment Cervical / Trunk Assessment: Back Surgery  Communication   Communication Communication: No apparent difficulties    Cognition Arousal: Alert Behavior During Therapy: WFL for tasks assessed/performed   PT - Cognitive impairments: No apparent impairments                         Following commands: Intact       Cueing Cueing Techniques: Verbal cues, Gestural cues     General Comments      Exercises     Assessment/Plan    PT Assessment Patient needs continued PT services  PT Problem List Decreased strength;Decreased activity tolerance;Decreased balance;Decreased mobility;Decreased knowledge of use of DME;Decreased safety awareness;Decreased knowledge of precautions;Pain       PT Treatment Interventions DME instruction;Gait training;Functional mobility training;Therapeutic activities;Therapeutic exercise;Balance training;Patient/family education    PT Goals (Current goals can be found in the Care  Plan section)  Acute Rehab PT Goals Patient Stated Goal: Home at d/c PT Goal Formulation: With patient/family Time For Goal Achievement: 08/14/24 Potential to Achieve Goals: Good    Frequency Min 5X/week     Co-evaluation               AM-PAC PT 6 Clicks Mobility  Outcome Measure Help needed turning from your back to your side while in a flat bed without using bedrails?: A Little Help needed moving from lying on your back to sitting on the side of a flat bed without using bedrails?: A Little Help needed moving to and from a bed to a chair (including a wheelchair)?: A Little Help needed standing up from a chair using your arms (e.g., wheelchair or bedside chair)?: A Little Help needed to walk in hospital room?: A Little Help needed climbing 3-5 steps with a railing? : A Little 6 Click Score: 18    End of Session Equipment Utilized During Treatment: Gait belt;Back brace Activity Tolerance: Patient tolerated treatment well Patient left: in bed;with call bell/phone within reach;with family/visitor present Nurse Communication: Mobility status PT Visit Diagnosis: Unsteadiness on feet (R26.81);Pain Pain - part of body:  (back)    Time: 9150-9069 PT Time Calculation (min) (ACUTE ONLY): 41 min   Charges:   PT Evaluation $PT Eval Low Complexity: 1 Low PT Treatments $Gait Training: 23-37 mins PT General Charges $$ ACUTE PT VISIT: 1 Visit         Leita Sable, PT, DPT Acute Rehabilitation Services Secure Chat  Preferred Office: [phone]   Leita JONETTA Sable 08/07/2024, 9:58 AM

## 2024-08-07 NOTE — Plan of Care (Signed)
Pt doing well. Pt and husband given D/C instructions with verbal understanding. Rx's were sent to the pharmacy by MD. Pt's incision is clean and dry with no sign of infection. Pt's IV and Hemovac were removed prior to D/C. Pt D/C'd home via wheelchair per MD order. Pt is stable @ D/C and has no other needs at this time. Holli Humbles, RN

## 2024-08-07 NOTE — Discharge Summary (Signed)
 Physician Discharge Summary  Patient ID: Chazlyn Cude Dantuono MRN: [id number] DOB/AGE: Jun 25, 1951 73 y.o.  Admit date: 08/06/2024 Discharge date: 08/07/2024  Admission Diagnoses: Lumbar spondylolisthesis, lumbar facet arthropathy, lumbar spinal stenosis, neurogenic claudication, lumbago, lumbar radiculopathy  Discharge Diagnoses: The same Principal Problem:   Spondylolisthesis of lumbar region   Discharged Condition: good  Hospital Course: I performed an L4-5 decompression, instrumentation and fusion on the patient on 08/06/2024.  The surgery went well.  The patient's postoperative course was unremarkable.  On postoperative day #1 the patient felt well and requested discharge to home.  The patient, and her husband, were given verbal and written discharge instructions.  All their questions were answered.  Consults: PT, care management Significant Diagnostic Studies: None Treatments: L4-5 decompression, instrumentation and fusion. Discharge Exam: Blood pressure (!) 114/57, pulse 71, temperature 98.2 F (36.8 C), temperature source Oral, resp. rate 18, height 5' 3 (1.6 m), weight 70.3 kg, SpO2 94%. The patient is alert and pleasant.  She looks well.  Her strength is normal.  Her dressing is clean and dry.  Disposition: Home  Discharge Instructions     Call MD for:  difficulty breathing, headache or visual disturbances   Complete by: As directed    Call MD for:  extreme fatigue   Complete by: As directed    Call MD for:  hives   Complete by: As directed    Call MD for:  persistant dizziness or light-headedness   Complete by: As directed    Call MD for:  persistant nausea and vomiting   Complete by: As directed    Call MD for:  redness, tenderness, or signs of infection (pain, swelling, redness, odor or green/yellow discharge around incision site)   Complete by: As directed    Call MD for:  severe uncontrolled pain   Complete by: As directed    Call MD for:  temperature >100.4    Complete by: As directed    Diet - low sodium heart healthy   Complete by: As directed    Discharge instructions   Complete by: As directed    Call [phone] for a followup appointment. Take a stool softener while you are using pain medications.   Driving Restrictions   Complete by: As directed    Do not drive for 2 weeks.   Increase activity slowly   Complete by: As directed    Lifting restrictions   Complete by: As directed    Do not lift more than 5 pounds. No excessive bending or twisting.   [place] shower / Bathe   Complete by: As directed    Remove the dressing for 3 days after surgery.  You [place] shower, but leave the incision alone.   Remove dressing in 48 hours   Complete by: As directed       Allergies as of 08/07/2024       Reactions   Codeine Nausea Only        Medication List     STOP taking these medications    acetaminophen  500 MG tablet Commonly known as: TYLENOL    celecoxib  100 MG capsule Commonly known as: CELEBREX    HYDROcodone -acetaminophen  5-325 MG tablet Commonly known as: NORCO/VICODIN   traMADol  50 MG tablet Commonly known as: ULTRAM        TAKE these medications    Aspirin  Low Dose 81 MG chewable tablet Generic drug: aspirin  Chew 81 mg by mouth daily.   Darbepoetin Alfa  500 MCG/ML Sosy injection Commonly known as: ARANESP  Inject  500 mcg into the skin See admin instructions. Inject 500 mcg into the skin every week as needed   docusate sodium 100 MG capsule Commonly known as: COLACE Take 1 capsule (100 mg total) by mouth 2 (two) times daily. What changed:  when to take this reasons to take this   gabapentin  300 MG capsule Commonly known as: NEURONTIN  Take 300 mg by mouth 3 (three) times daily.   levothyroxine  25 MCG tablet Commonly known as: SYNTHROID  TAKE ONE TAB BY MOUTH ONCE DAILY. TAKE ON AN EMPTY STOMACH WITH A GLASS OF WATER ATLEAST 30-60 MINUTES BEFORE BREAKFAST   luspatercept -aamt 75 MG subcutaneous  injection Commonly known as: REBLOZYL  Inject into the skin every 28 (twenty-eight) days.   methocarbamol 500 MG tablet Commonly known as: ROBAXIN Take 1 tablet (500 mg total) by mouth 3 (three) times daily.   omeprazole  20 MG capsule Commonly known as: PRILOSEC Take 1 capsule (20 mg total) by mouth daily.   oxyCODONE -acetaminophen  5-325 MG tablet Commonly known as: PERCOCET/ROXICET Take 1-2 tablets by mouth every 4 (four) hours as needed for severe pain (pain score 7-10). What changed: how much to take   promethazine  25 MG tablet Commonly known as: PHENERGAN  Take 1 tablet (25 mg total) by mouth every 6 (six) hours as needed for nausea or vomiting. What changed:  how much to take when to take this   Vitamin D  (Ergocalciferol ) 1.25 MG (50000 UNIT) Caps capsule Commonly known as: DRISDOL  TAKE 1 CAPSULES BY MOUTH ONCE A WEEK What changed: See the new instructions.         Signed: [name] 08/07/2024, 7:47 AM

## 2024-08-08 ENCOUNTER — Encounter (HOSPITAL_COMMUNITY): Payer: Self-pay

## 2024-08-08 ENCOUNTER — Inpatient Hospital Stay (HOSPITAL_COMMUNITY)
Admission: EM | Admit: 2024-08-08 | Discharge: 2024-08-14 | DRG: 193 | Disposition: A | Discharge status: Skilled Nursing Facility

## 2024-08-08 ENCOUNTER — Inpatient Hospital Stay (HOSPITAL_COMMUNITY)

## 2024-08-08 ENCOUNTER — Other Ambulatory Visit: Payer: Self-pay

## 2024-08-08 ENCOUNTER — Emergency Department (HOSPITAL_COMMUNITY)

## 2024-08-08 DIAGNOSIS — J9601 Acute respiratory failure with hypoxia: Secondary | ICD-10-CM | POA: Diagnosis present

## 2024-08-08 DIAGNOSIS — Z8249 Family history of ischemic heart disease and other diseases of the circulatory system: Secondary | ICD-10-CM

## 2024-08-08 DIAGNOSIS — Z7982 Long term (current) use of aspirin: Secondary | ICD-10-CM

## 2024-08-08 DIAGNOSIS — R7303 Prediabetes: Secondary | ICD-10-CM | POA: Diagnosis present

## 2024-08-08 DIAGNOSIS — K219 Gastro-esophageal reflux disease without esophagitis: Secondary | ICD-10-CM | POA: Diagnosis present

## 2024-08-08 DIAGNOSIS — Z96612 Presence of left artificial shoulder joint: Secondary | ICD-10-CM | POA: Diagnosis not present

## 2024-08-08 DIAGNOSIS — E2740 Unspecified adrenocortical insufficiency: Secondary | ICD-10-CM | POA: Diagnosis present

## 2024-08-08 DIAGNOSIS — E039 Hypothyroidism, unspecified: Secondary | ICD-10-CM | POA: Diagnosis present

## 2024-08-08 DIAGNOSIS — Z833 Family history of diabetes mellitus: Secondary | ICD-10-CM | POA: Diagnosis not present

## 2024-08-08 DIAGNOSIS — R9389 Abnormal findings on diagnostic imaging of other specified body structures: Secondary | ICD-10-CM | POA: Diagnosis not present

## 2024-08-08 DIAGNOSIS — Z885 Allergy status to narcotic agent status: Secondary | ICD-10-CM | POA: Diagnosis not present

## 2024-08-08 DIAGNOSIS — D469 Myelodysplastic syndrome, unspecified: Secondary | ICD-10-CM | POA: Diagnosis present

## 2024-08-08 DIAGNOSIS — Z8262 Family history of osteoporosis: Secondary | ICD-10-CM | POA: Diagnosis not present

## 2024-08-08 DIAGNOSIS — K5900 Constipation, unspecified: Secondary | ICD-10-CM | POA: Diagnosis present

## 2024-08-08 DIAGNOSIS — N1831 Chronic kidney disease, stage 3a: Secondary | ICD-10-CM | POA: Diagnosis present

## 2024-08-08 DIAGNOSIS — I959 Hypotension, unspecified: Secondary | ICD-10-CM | POA: Diagnosis not present

## 2024-08-08 DIAGNOSIS — Y92003 Bedroom of unspecified non-institutional (private) residence as the place of occurrence of the external cause: Secondary | ICD-10-CM | POA: Diagnosis not present

## 2024-08-08 DIAGNOSIS — M549 Dorsalgia, unspecified: Secondary | ICD-10-CM | POA: Diagnosis not present

## 2024-08-08 DIAGNOSIS — Z471 Aftercare following joint replacement surgery: Secondary | ICD-10-CM | POA: Diagnosis not present

## 2024-08-08 DIAGNOSIS — E871 Hypo-osmolality and hyponatremia: Secondary | ICD-10-CM | POA: Diagnosis present

## 2024-08-08 DIAGNOSIS — J189 Pneumonia, unspecified organism: Principal | ICD-10-CM | POA: Diagnosis present

## 2024-08-08 DIAGNOSIS — J168 Pneumonia due to other specified infectious organisms: Secondary | ICD-10-CM | POA: Diagnosis not present

## 2024-08-08 DIAGNOSIS — A419 Sepsis, unspecified organism: Secondary | ICD-10-CM | POA: Diagnosis present

## 2024-08-08 DIAGNOSIS — Z8261 Family history of arthritis: Secondary | ICD-10-CM

## 2024-08-08 DIAGNOSIS — Z823 Family history of stroke: Secondary | ICD-10-CM

## 2024-08-08 DIAGNOSIS — Z751 Person awaiting admission to adequate facility elsewhere: Secondary | ICD-10-CM | POA: Diagnosis not present

## 2024-08-08 DIAGNOSIS — Z981 Arthrodesis status: Secondary | ICD-10-CM | POA: Diagnosis not present

## 2024-08-08 DIAGNOSIS — Z803 Family history of malignant neoplasm of breast: Secondary | ICD-10-CM

## 2024-08-08 DIAGNOSIS — G8918 Other acute postprocedural pain: Secondary | ICD-10-CM | POA: Diagnosis present

## 2024-08-08 DIAGNOSIS — M7989 Other specified soft tissue disorders: Secondary | ICD-10-CM | POA: Diagnosis not present

## 2024-08-08 DIAGNOSIS — Z79890 Hormone replacement therapy (postmenopausal): Secondary | ICD-10-CM

## 2024-08-08 DIAGNOSIS — M4316 Spondylolisthesis, lumbar region: Secondary | ICD-10-CM | POA: Diagnosis not present

## 2024-08-08 DIAGNOSIS — R000 Tachycardia, unspecified: Secondary | ICD-10-CM | POA: Diagnosis not present

## 2024-08-08 DIAGNOSIS — R0902 Hypoxemia: Secondary | ICD-10-CM

## 2024-08-08 DIAGNOSIS — R531 Weakness: Secondary | ICD-10-CM | POA: Diagnosis not present

## 2024-08-08 DIAGNOSIS — R2689 Other abnormalities of gait and mobility: Secondary | ICD-10-CM | POA: Diagnosis not present

## 2024-08-08 DIAGNOSIS — R918 Other nonspecific abnormal finding of lung field: Secondary | ICD-10-CM | POA: Diagnosis not present

## 2024-08-08 DIAGNOSIS — Z79899 Other long term (current) drug therapy: Secondary | ICD-10-CM | POA: Diagnosis not present

## 2024-08-08 DIAGNOSIS — M6281 Muscle weakness (generalized): Secondary | ICD-10-CM | POA: Diagnosis not present

## 2024-08-08 DIAGNOSIS — W06XXXA Fall from bed, initial encounter: Secondary | ICD-10-CM | POA: Diagnosis present

## 2024-08-08 DIAGNOSIS — R0602 Shortness of breath: Secondary | ICD-10-CM | POA: Diagnosis not present

## 2024-08-08 DIAGNOSIS — Z9181 History of falling: Secondary | ICD-10-CM | POA: Diagnosis not present

## 2024-08-08 DIAGNOSIS — J9811 Atelectasis: Secondary | ICD-10-CM | POA: Diagnosis not present

## 2024-08-08 DIAGNOSIS — R339 Retention of urine, unspecified: Secondary | ICD-10-CM | POA: Diagnosis present

## 2024-08-08 LAB — URINALYSIS, W/ REFLEX TO CULTURE (INFECTION SUSPECTED)
Bilirubin Urine: NEGATIVE
Glucose, UA: NEGATIVE mg/dL
Ketones, ur: NEGATIVE mg/dL
Leukocytes,Ua: NEGATIVE
Nitrite: NEGATIVE
Protein, ur: NEGATIVE mg/dL
Specific Gravity, Urine: 1.019 (ref 1.005–1.030)
pH: 5 (ref 5.0–8.0)

## 2024-08-08 LAB — I-STAT CG4 LACTIC ACID, ED
Lactic Acid, Venous: 1.5 mmol/L (ref 0.5–1.9)
Lactic Acid, Venous: 1.7 mmol/L (ref 0.5–1.9)

## 2024-08-08 LAB — CBC WITH DIFFERENTIAL/PLATELET
Abs Immature Granulocytes: 3.53 K/uL — ABNORMAL HIGH (ref 0.00–0.07)
Basophils Absolute: 0.2 K/uL — ABNORMAL HIGH (ref 0.0–0.1)
Basophils Relative: 1 %
Eosinophils Absolute: 0 K/uL (ref 0.0–0.5)
Eosinophils Relative: 0 %
HCT: 28.2 % — ABNORMAL LOW (ref 36.0–46.0)
Hemoglobin: 8.5 g/dL — ABNORMAL LOW (ref 12.0–15.0)
Immature Granulocytes: 10 %
Lymphocytes Relative: 8 %
Lymphs Abs: 2.8 K/uL (ref 0.7–4.0)
MCH: 29.8 pg (ref 26.0–34.0)
MCHC: 30.1 g/dL (ref 30.0–36.0)
MCV: 98.9 fL (ref 80.0–100.0)
Monocytes Absolute: 7.4 K/uL — ABNORMAL HIGH (ref 0.1–1.0)
Monocytes Relative: 21 %
Neutro Abs: 21.6 K/uL — ABNORMAL HIGH (ref 1.7–7.7)
Neutrophils Relative %: 60 %
Platelets: 190 K/uL (ref 150–400)
RBC: 2.85 MIL/uL — ABNORMAL LOW (ref 3.87–5.11)
RDW: 28.9 % — ABNORMAL HIGH (ref 11.5–15.5)
Smear Review: NORMAL
WBC: 35.6 K/uL — ABNORMAL HIGH (ref 4.0–10.5)
nRBC: 0.8 % — ABNORMAL HIGH (ref 0.0–0.2)

## 2024-08-08 LAB — PROTIME-INR
INR: 1.4 — ABNORMAL HIGH (ref 0.8–1.2)
Prothrombin Time: 17.7 s — ABNORMAL HIGH (ref 11.4–15.2)

## 2024-08-08 LAB — RESP PANEL BY RT-PCR (RSV, FLU A&B, COVID)  RVPGX2
Influenza A by PCR: NEGATIVE
Influenza B by PCR: NEGATIVE
Resp Syncytial Virus by PCR: NEGATIVE
SARS Coronavirus 2 by RT PCR: NEGATIVE

## 2024-08-08 LAB — COMPREHENSIVE METABOLIC PANEL WITH GFR
ALT: 18 U/L (ref 0–44)
AST: 43 U/L — ABNORMAL HIGH (ref 15–41)
Albumin: 3.4 g/dL — ABNORMAL LOW (ref 3.5–5.0)
Alkaline Phosphatase: 39 U/L (ref 38–126)
Anion gap: 13 (ref 5–15)
BUN: 13 mg/dL (ref 8–23)
CO2: 23 mmol/L (ref 22–32)
Calcium: 8.4 mg/dL — ABNORMAL LOW (ref 8.9–10.3)
Chloride: 99 mmol/L (ref 98–111)
Creatinine, Ser: 1.09 mg/dL — ABNORMAL HIGH (ref 0.44–1.00)
GFR, Estimated: 53 mL/min — ABNORMAL LOW (ref >60)
Glucose, Bld: 124 mg/dL — ABNORMAL HIGH (ref 70–99)
Potassium: 4.1 mmol/L (ref 3.5–5.1)
Sodium: 135 mmol/L (ref 135–145)
Total Bilirubin: 1.2 mg/dL (ref 0.0–1.2)
Total Protein: 7.5 g/dL (ref 6.5–8.1)

## 2024-08-08 LAB — MAGNESIUM: Magnesium: 1.8 mg/dL (ref 1.7–2.4)

## 2024-08-08 LAB — PROCALCITONIN: Procalcitonin: 0.3 ng/mL

## 2024-08-08 LAB — D-DIMER, QUANTITATIVE: D-Dimer, Quant: 1.54 ug{FEU}/mL — ABNORMAL HIGH (ref 0.00–0.50)

## 2024-08-08 LAB — C-REACTIVE PROTEIN: CRP: 10.2 mg/dL — ABNORMAL HIGH (ref <1.0)

## 2024-08-08 MED ORDER — LACTATED RINGERS IV BOLUS (SEPSIS)
1000.0000 mL | Freq: Once | INTRAVENOUS | Status: AC
  Administered 2024-08-08: 1000 mL via INTRAVENOUS

## 2024-08-08 MED ORDER — SODIUM CHLORIDE 0.9 % IV SOLN
2.0000 g | Freq: Once | INTRAVENOUS | Status: AC
  Administered 2024-08-08: 2 g via INTRAVENOUS
  Filled 2024-08-08: qty 20

## 2024-08-08 MED ORDER — ONDANSETRON HCL 4 MG PO TABS: 4.0000 mg | ORAL_TABLET | Freq: Four times a day (QID) | ORAL | Status: DC | PRN

## 2024-08-08 MED ORDER — FENTANYL CITRATE (PF) 50 MCG/ML IJ SOSY
50.0000 ug | PREFILLED_SYRINGE | Freq: Once | INTRAMUSCULAR | Status: AC
  Administered 2024-08-08: 50 ug via INTRAVENOUS
  Filled 2024-08-08: qty 1

## 2024-08-08 MED ORDER — LACTATED RINGERS IV SOLN: INTRAVENOUS | Status: DC

## 2024-08-08 MED ORDER — POLYETHYLENE GLYCOL 3350 17 G PO PACK
17.0000 g | PACK | Freq: Every day | ORAL | Status: DC | PRN
Start: 2024-08-08 — End: 2024-08-14
  Administered 2024-08-11: 17 g via ORAL
  Filled 2024-08-08: qty 1

## 2024-08-08 MED ORDER — CHLORHEXIDINE GLUCONATE CLOTH 2 % EX PADS
6.0000 | MEDICATED_PAD | Freq: Every day | CUTANEOUS | Status: DC
  Administered 2024-08-09 – 2024-08-11 (×3): 6 via TOPICAL

## 2024-08-08 MED ORDER — HEPARIN SODIUM (PORCINE) 5000 UNIT/ML IJ SOLN
5000.0000 [IU] | Freq: Three times a day (TID) | INTRAMUSCULAR | Status: DC
  Administered 2024-08-08 – 2024-08-13 (×14): 5000 [IU] via SUBCUTANEOUS
  Filled 2024-08-08 (×14): qty 1

## 2024-08-08 MED ORDER — ASPIRIN 81 MG PO CHEW
81.0000 mg | CHEWABLE_TABLET | Freq: Every day | ORAL | Status: DC
  Administered 2024-08-09 – 2024-08-14 (×6): 81 mg via ORAL
  Filled 2024-08-08 (×6): qty 1

## 2024-08-08 MED ORDER — ONDANSETRON HCL 4 MG/2ML IJ SOLN: 4.0000 mg | Freq: Four times a day (QID) | INTRAMUSCULAR | Status: DC | PRN

## 2024-08-08 MED ORDER — LACTATED RINGERS IV SOLN
150.0000 mL/h | Freq: Once | INTRAVENOUS | Status: AC
  Administered 2024-08-08: 150 mL/h via INTRAVENOUS

## 2024-08-08 MED ORDER — SODIUM CHLORIDE 0.9 % IV SOLN
500.0000 mg | INTRAVENOUS | Status: AC
  Administered 2024-08-08 – 2024-08-09 (×2): 500 mg via INTRAVENOUS
  Filled 2024-08-08 (×2): qty 5

## 2024-08-08 MED ORDER — OXYCODONE-ACETAMINOPHEN 5-325 MG PO TABS
1.0000 | ORAL_TABLET | ORAL | Status: DC | PRN
  Administered 2024-08-08 – 2024-08-09 (×2): 1 via ORAL
  Filled 2024-08-08: qty 1
  Filled 2024-08-08: qty 2
  Filled 2024-08-08: qty 1

## 2024-08-08 MED ORDER — SODIUM CHLORIDE 0.9 % IV SOLN
2.0000 g | INTRAVENOUS | Status: AC
  Administered 2024-08-09 – 2024-08-12 (×4): 2 g via INTRAVENOUS
  Filled 2024-08-08 (×4): qty 20

## 2024-08-08 MED ORDER — CHLORHEXIDINE GLUCONATE CLOTH 2 % EX PADS
6.0000 | MEDICATED_PAD | Freq: Every day | CUTANEOUS | Status: DC
  Administered 2024-08-08 – 2024-08-11 (×4): 6 via TOPICAL

## 2024-08-08 MED ORDER — DEXAMETHASONE SOD PHOSPHATE PF 10 MG/ML IJ SOLN
10.0000 mg | Freq: Once | INTRAMUSCULAR | Status: AC
  Administered 2024-08-08: 10 mg via INTRAVENOUS

## 2024-08-08 MED ORDER — VANCOMYCIN HCL IN DEXTROSE 1-5 GM/200ML-% IV SOLN: 1000.0000 mg | INTRAVENOUS | Status: DC

## 2024-08-08 MED ORDER — BISACODYL 5 MG PO TBEC: 5.0000 mg | DELAYED_RELEASE_TABLET | Freq: Every day | ORAL | Status: DC | PRN

## 2024-08-08 MED ORDER — SODIUM CHLORIDE 0.9 % IV SOLN
500.0000 mg | Freq: Once | INTRAVENOUS | Status: AC
  Administered 2024-08-08: 500 mg via INTRAVENOUS
  Filled 2024-08-08: qty 5

## 2024-08-08 MED ORDER — VANCOMYCIN HCL 1500 MG/300ML IV SOLN
1500.0000 mg | Freq: Once | INTRAVENOUS | Status: AC
  Administered 2024-08-08: 1500 mg via INTRAVENOUS
  Filled 2024-08-08: qty 300

## 2024-08-08 MED ORDER — ACETAMINOPHEN 325 MG PO TABS: 650.0000 mg | ORAL_TABLET | Freq: Four times a day (QID) | ORAL | Status: DC | PRN

## 2024-08-08 MED ORDER — LEVOTHYROXINE SODIUM 25 MCG PO TABS
25.0000 ug | ORAL_TABLET | Freq: Every day | ORAL | Status: DC
  Administered 2024-08-09 – 2024-08-14 (×6): 25 ug via ORAL
  Filled 2024-08-08 (×6): qty 1

## 2024-08-08 MED ORDER — DEXAMETHASONE SODIUM PHOSPHATE 4 MG/ML IJ SOLN
4.0000 mg | Freq: Four times a day (QID) | INTRAMUSCULAR | Status: DC
  Administered 2024-08-08 – 2024-08-09 (×4): 4 mg via INTRAVENOUS
  Filled 2024-08-08 (×4): qty 1

## 2024-08-08 MED ORDER — HYDROMORPHONE HCL 1 MG/ML IJ SOLN
1.0000 mg | Freq: Four times a day (QID) | INTRAMUSCULAR | Status: DC | PRN
  Administered 2024-08-08 – 2024-08-09 (×3): 1 mg via INTRAVENOUS
  Filled 2024-08-08 (×3): qty 1

## 2024-08-08 MED ORDER — ACETAMINOPHEN 650 MG RE SUPP
650.0000 mg | Freq: Four times a day (QID) | RECTAL | Status: DC | PRN
Start: 2024-08-08 — End: 2024-08-09

## 2024-08-08 NOTE — ED Notes (Signed)
 X RAY at bedside

## 2024-08-08 NOTE — Progress Notes (Signed)
 Subjective: The patient is somnolent but easily arousable.  She appears comfortable and in no apparent distress.  Her son and daughter are at the bedside.  They tell me that immediately upon returning home from surgery yesterday she began having increasing back pain.  Objective: Vital signs in last 24 hours: Temp:  [98.7 F (37.1 C)-100 F (37.8 C)] 98.7 F (37.1 C) (11/12 1707) Pulse Rate:  [97-113] 103 (11/12 1708) Resp:  [13-22] 16 (11/12 1707) BP: (116-161)/(51-86) 149/80 (11/12 1707) SpO2:  [85 %-99 %] 93 % (11/12 1708) Weight:  [70.3 kg] 70.3 kg (11/12 0829) Estimated body mass index is 27.46 kg/m as calculated from the following:   Height as of this encounter: 5' 3 (1.6 m).   Weight as of this encounter: 70.3 kg.   Intake/Output from previous day: No intake/output data recorded. Intake/Output this shift: Total I/O In: -  Out: 1000 [Urine:1000]  Physical exam the patient is somnolent but easily arousable.  Her lower extremity strength is grossly normal.  Lab Results: Recent Labs    08/08/24 0840  WBC 35.6*  HGB 8.5*  HCT 28.2*  PLT 190   BMET Recent Labs    08/08/24 0840  NA 135  K 4.1  CL 99  CO2 23  GLUCOSE 124*  BUN 13  CREATININE 1.09*  CALCIUM 8.4*    Studies/Results: DG Chest Port 1 View if patient is in a treatment room. Result Date: 08/08/2024 EXAM: 1 VIEW(S) XRAY OF THE CHEST 08/08/2024 08:56:00 AM COMPARISON: 05/17/2020 status post left shoulder arthroplasty. CLINICAL HISTORY: Suspected Sepsis. FINDINGS: LUNGS AND PLEURA: Elevated right hemidiaphragm. Minimal right basilar opacity is noted concerning for possible pneumonia or atelectasis. No pulmonary edema. No pleural effusion. No pneumothorax. HEART AND MEDIASTINUM: No acute abnormality of the cardiac and mediastinal silhouettes. BONES AND SOFT TISSUES: Status post left shoulder arthroplasty. No acute osseous abnormality. IMPRESSION: 1. Minimal right basilar opacity, compatible with  atelectasis or pneumonia. Electronically signed by: Lynwood Seip MD 08/08/2024 09:12 AM EST RP Workstation: [id number]    Assessment/Plan: Postop day #2: It looks like the patient will need rehab.  Leukocytosis: The patient recently had surgery.  She has a history of myelodysplastic syndrome.  Her chest x-ray demonstrates atelectasis or possible pneumonia.  It is too soon for a wound infection.  I appreciate Dr. Anthony help with this patient.  I have answered all the patient's children's questions.  LOS: 0 days     Carla [name] 08/08/2024, 6:32 PM     Patient ID: Carla [name], female   DOB: 1951/07/10, 73 y.o.   MRN: [id number]

## 2024-08-08 NOTE — Progress Notes (Signed)
 Pharmacy Antibiotic Note  Carla [name] is a 73 y.o. female admitted on 08/08/2024 with discitis.  Pharmacy has been consulted for vancomycin  dosing.  Plan: Vancomycin  1.5g IV load, then vancomycin  1g q24h. Follow up EOT.   Height: 5' 3 (160 cm) Weight: 70.3 kg (155 lb) IBW/kg (Calculated) : 52.4  Temp (24hrs), Avg:99.8 F (37.7 C), Min:99.5 F (37.5 C), Max:100 F (37.8 C)  Recent Labs  Lab 08/08/24 0840 08/08/24 0858 08/08/24 1139  WBC 35.6*  --   --   CREATININE 1.09*  --   --   LATICACIDVEN  --  1.5 1.7    Estimated Creatinine Clearance: 42.6 mL/min (A) (by C-G formula based on SCr of 1.09 mg/dL (H)).    Allergies  Allergen Reactions   Codeine Nausea Only   Antimicrobials this admission: Ceftriaxone  11/12 >> Azithromycin  11/12 >>   Microbiology results: 11/12 BCx:   Thank you for allowing pharmacy to be a part of this patient's care.  Haydon Kalmar M Marti Mclane 08/08/2024 2:30 PM

## 2024-08-08 NOTE — ED Notes (Signed)
 CCMD called.

## 2024-08-08 NOTE — ED Triage Notes (Signed)
 Per EMS, Pt, from home, presents after a slid out of bed/fall this morning.  Pt had a lumbar fusion x2 days ago and went home yesterday.  Pain score 10/10.  Pt noted to be febrile, tachycardic, and labored breathing.  Pt denies cough and dysuria.   Pt's husband reported Pt has not gotten out of bed since coming home.

## 2024-08-08 NOTE — Consult Note (Signed)
 Providing Compassionate, Quality Care - Together   Reason for Consult: Postoperative pain Referring Physician: Dr. Ula Handing [name] is an 73 y.o. female.  HPI: Carla [name] is a 73 year old female with a past medical history outline below. Her husband is at the bedside and assists with the patient's recent history. She underwent an L4-5 PLIF by Dr. Mavis on 08/06/2024 and discharged home on 08/07/2024. She was ambulating in the hall and voiding without issue prior to discharge. At home, her pain progressively worsened to the point she was not mobilizing. Her husband attempted to assist her out of bed this morning and Carla [name] slid to the floor. It sounds like Carla [name] had not had any pain medication since the night before upon arrival to the emergency department. Neurosurgery was asked to consult given patient's recent surgery, but the Hospitalists will admit due to patient's comorbidities.  Past Medical History:  Diagnosis Date   Allergic rhinitis, cause unspecified    Anemia, unspecified    Arthritis    Cancer (HCC)    myelodysplastic syndrome   Carpal tunnel syndrome    Cataract 2007/2010   Cystitis, unspecified    Family history of osteoporosis    Hypothyroidism    PONV (postoperative nausea and vomiting)    Postnasal drip    Syncope and collapse     Past Surgical History:  Procedure Laterality Date   BACK SURGERY     BONE MARROW BIOPSY     CARPAL TUNNEL RELEASE Bilateral    COLONOSCOPY  09/28/2003   COLONOSCOPY WITH PROPOFOL  N/A 07/31/2015   Procedure: COLONOSCOPY WITH PROPOFOL ;  Surgeon: Lamar Bunk, MD;  Location: [facility];  Service: Endoscopy;  Laterality: N/A;   DIAGNOSTIC LAPAROSCOPY     dx lack of pregnancy    EYE SURGERY     lasik, cataract, prk,yag procedure   REVERSE SHOULDER ARTHROPLASTY Left 03/21/2024   Procedure: ARTHROPLASTY, SHOULDER, TOTAL, REVERSE FOR FRACTURE;  Surgeon: Cristy Bonner DASEN, MD;  Location: [facility];  Service: Orthopedics;  Laterality: Left;    TONSILLECTOMY  1958    Family History  Problem Relation Age of Onset   Osteoporosis Mother    Stroke Mother    Arthritis Mother    Coronary artery disease Father    Stroke Father 69   Arthritis Father    Heart disease Father    Diabetes Other        Aunts and uncles   Breast cancer Paternal Aunt    Breast cancer Maternal Aunt    Arthritis/Rheumatoid Child    Breast cancer Cousin    Cancer Maternal Aunt    Cancer Maternal Aunt    Cancer Paternal Aunt    Diabetes Paternal Aunt    Diabetes Sister    Diabetes Paternal Uncle    Heart disease Paternal Uncle    Stroke Paternal Uncle    Diabetes Paternal Aunt    Stroke Paternal Aunt     Social History:  reports that she has never smoked. She has never used smokeless tobacco. She reports that she does not currently use alcohol. She reports that she does not use drugs.  Allergies:  Allergies  Allergen Reactions   Codeine Nausea Only    Medications: I have reviewed the patient's current medications.  Results for orders placed or performed during the hospital encounter of 08/08/24 (from the past 48 hours)  Comprehensive metabolic panel     Status: Abnormal   Collection Time: 08/08/24  8:40 AM  Result Value Ref Range   Sodium 135 135 - 145 mmol/L   Potassium 4.1 3.5 - 5.1 mmol/L   Chloride 99 98 - 111 mmol/L   CO2 23 22 - 32 mmol/L   Glucose, Bld 124 (H) 70 - 99 mg/dL    Comment: Glucose reference range applies only to samples taken after fasting for at least 8 hours.   BUN 13 8 - 23 mg/dL   Creatinine, Ser 8.90 (H) 0.44 - 1.00 mg/dL   Calcium 8.4 (L) 8.9 - 10.3 mg/dL   Total Protein 7.5 6.5 - 8.1 g/dL   Albumin 3.4 (L) 3.5 - 5.0 g/dL   AST 43 (H) 15 - 41 U/L   ALT 18 0 - 44 U/L   Alkaline Phosphatase 39 38 - 126 U/L   Total Bilirubin 1.2 0.0 - 1.2 mg/dL   GFR, Estimated 53 (L) >60 mL/min    Comment: (NOTE) Calculated using the CKD-EPI Creatinine Equation (2021)    Anion gap 13 5 - 15    Comment: Performed at  [facility], [address], [place], [place] [address]  CBC with Differential     Status: Abnormal   Collection Time: 08/08/24  8:40 AM  Result Value Ref Range   WBC 35.6 (H) 4.0 - 10.5 K/uL   RBC 2.85 (L) 3.87 - 5.11 MIL/uL   Hemoglobin 8.5 (L) 12.0 - 15.0 g/dL   HCT 71.7 (L) 63.9 - 53.9 %   MCV 98.9 80.0 - 100.0 fL   MCH 29.8 26.0 - 34.0 pg   MCHC 30.1 30.0 - 36.0 g/dL   RDW 71.0 (H) 88.4 - 84.4 %   Platelets 190 150 - 400 K/uL    Comment: REPEATED TO VERIFY   nRBC 0.8 (H) 0.0 - 0.2 %   Neutrophils Relative % 60 %   Neutro Abs 21.6 (H) 1.7 - 7.7 K/uL   Lymphocytes Relative 8 %   Lymphs Abs 2.8 0.7 - 4.0 K/uL   Monocytes Relative 21 %   Monocytes Absolute 7.4 (H) 0.1 - 1.0 K/uL   Eosinophils Relative 0 %   Eosinophils Absolute 0.0 0.0 - 0.5 K/uL   Basophils Relative 1 %   Basophils Absolute 0.2 (H) 0.0 - 0.1 K/uL   WBC Morphology See Note     Comment: ATYPICAL MONONUCLEAR CELLS SENT TO PATH FOR REVIEW    RBC Morphology See Note    Smear Review Normal platelet morphology    Immature Granulocytes 10 %   Abs Immature Granulocytes 3.53 (H) 0.00 - 0.07 K/uL   Tear Drop Cells PRESENT     Comment: Performed at [facility], [address] [place], [place] [address]  Protime-INR     Status: Abnormal   Collection Time: 08/08/24  8:40 AM  Result Value Ref Range   Prothrombin Time 17.7 (H) 11.4 - 15.2 seconds   INR 1.4 (H) 0.8 - 1.2    Comment: (NOTE) INR goal varies based on device and disease states. Performed at [facility], [address], [place], [place] [address]   I-Stat Lactic Acid, ED     Status: None   Collection Time: 08/08/24  8:58 AM  Result Value Ref Range   Lactic Acid, Venous 1.5 0.5 - 1.9 mmol/L  Resp panel by RT-PCR (RSV, Flu A&B, Covid) Anterior Nasal Swab     Status: None   Collection Time: 08/08/24 10:22 AM   Specimen: Anterior Nasal Swab  Result Value Ref Range  SARS Coronavirus 2 by RT PCR NEGATIVE NEGATIVE   Influenza A by  PCR NEGATIVE NEGATIVE   Influenza B by PCR NEGATIVE NEGATIVE    Comment: (NOTE) The Xpert Xpress SARS-CoV-2/FLU/RSV plus assay is intended as an aid in the diagnosis of influenza from Nasopharyngeal swab specimens and should not be used as a sole basis for treatment. Nasal washings and aspirates are unacceptable for Xpert Xpress SARS-CoV-2/FLU/RSV testing.  Fact Sheet for Patients: bloggercourse.com  Fact Sheet for Healthcare Providers: seriousbroker.it  This test is not yet approved or cleared by the [place]  FDA and has been authorized for detection and/or diagnosis of SARS-CoV-2 by FDA under an Emergency Use Authorization (EUA). This EUA will remain in effect (meaning this test can be used) for the duration of the COVID-19 declaration under Section 564(b)(1) of the Act, 21 U.S.C. section 360bbb-3(b)(1), unless the authorization is terminated or revoked.     Resp Syncytial Virus by PCR NEGATIVE NEGATIVE    Comment: (NOTE) Fact Sheet for Patients: bloggercourse.com  Fact Sheet for Healthcare Providers: seriousbroker.it  This test is not yet approved or cleared by the [place]  FDA and has been authorized for detection and/or diagnosis of SARS-CoV-2 by FDA under an Emergency Use Authorization (EUA). This EUA will remain in effect (meaning this test can be used) for the duration of the COVID-19 declaration under Section 564(b)(1) of the Act, 21 U.S.C. section 360bbb-3(b)(1), unless the authorization is terminated or revoked.  Performed at [facility], [address], [place], [place] [address]   I-Stat Lactic Acid, ED     Status: None   Collection Time: 08/08/24 11:39 AM  Result Value Ref Range   Lactic Acid, Venous 1.7 0.5 - 1.9 mmol/L    DG Chest Port 1 View if patient is in a treatment room. Result Date: 08/08/2024 EXAM: 1 VIEW(S) XRAY OF THE CHEST  08/08/2024 08:56:00 AM COMPARISON: 05/17/2020 status post left shoulder arthroplasty. CLINICAL HISTORY: Suspected Sepsis. FINDINGS: LUNGS AND PLEURA: Elevated right hemidiaphragm. Minimal right basilar opacity is noted concerning for possible pneumonia or atelectasis. No pulmonary edema. No pleural effusion. No pneumothorax. HEART AND MEDIASTINUM: No acute abnormality of the cardiac and mediastinal silhouettes. BONES AND SOFT TISSUES: Status post left shoulder arthroplasty. No acute osseous abnormality. IMPRESSION: 1. Minimal right basilar opacity, compatible with atelectasis or pneumonia. Electronically signed by: Lynwood Seip MD 08/08/2024 09:12 AM EST RP Workstation: [id number]   DG Lumbar Spine 2-3 Views Result Date: 08/06/2024 CLINICAL DATA:  L4-5 fusion EXAM: LUMBAR SPINE - 2-3 VIEW COMPARISON:  MRI 05/15/2024 FINDINGS: The lowest lumbar type non-rib-bearing vertebra is labeled as L5. Tissue spreaders in place posteriorly at L4-5 with an instrument projecting over the L4-5 intervertebral disc space. IMPRESSION: 1. Instrument projects over the L4-5 intervertebral disc space. Electronically Signed   By: Ryan Salvage M.D.   On: 08/06/2024 19:16   DG Lumbar Spine 1 View Result Date: 08/06/2024 CLINICAL DATA:  Intraoperative images for localization EXAM: LUMBAR SPINE - 1 VIEW COMPARISON:  05/15/2024 MRI FINDINGS: The lowest lumbar type non-rib-bearing vertebra is labeled as L5. Tissue spreaders are in place with a blunt tip slightly curved probe projecting between the L4 and L5 spinous processes in oriented towards the back of the L5 vertebral body. IMPRESSION: 1. Intraoperative localization with probe tip oriented towards the back of the L5 vertebral body. Electronically Signed   By: Ryan Salvage M.D.   On: 08/06/2024 19:15   DG C-Arm 1-60 Min-No Report Result Date: 08/06/2024 Fluoroscopy was  utilized by the requesting physician.  No radiographic interpretation.   DG C-Arm 1-60 Min-No  Report Result Date: 08/06/2024 Fluoroscopy was utilized by the requesting physician.  No radiographic interpretation.    Review of Systems  Constitutional:  Positive for fever.  HENT: Negative.    Eyes: Negative.   Respiratory: Negative.    Cardiovascular: Negative.   Gastrointestinal: Negative.   Genitourinary: Negative.   Musculoskeletal:  Positive for back pain and falls.  Neurological:  Positive for weakness.  Psychiatric/Behavioral: Negative.     Blood pressure 127/62, pulse (!) 109, temperature 100 F (37.8 C), temperature source Oral, resp. rate 19, height 5' 3 (1.6 m), weight 70.3 kg, SpO2 95%. Estimated body mass index is 27.46 kg/m as calculated from the following:   Height as of this encounter: 5' 3 (1.6 m).   Weight as of this encounter: 70.3 kg.  Physical Exam Constitutional:      Appearance: She is ill-appearing.     Interventions: Nasal cannula in place.  HENT:     Head: Normocephalic and atraumatic.     Nose: Nose normal.     Mouth/Throat:     Mouth: Mucous membranes are dry.  Eyes:     Extraocular Movements: Extraocular movements intact.     Pupils: Pupils are equal, round, and reactive to light.  Cardiovascular:     Rate and Rhythm: Regular rhythm. Tachycardia present.     Pulses: Normal pulses.  Musculoskeletal:     Cervical back: Normal range of motion and neck supple.     Comments: Physical exam is very limited due to patient's pain level. She is able to move all extremities, but movements are very guarded.  Skin:    General: Skin is warm and dry.     Capillary Refill: Capillary refill takes less than 2 seconds.      Neurological:     Mental Status: She is oriented to person, place, and time. She is lethargic.  Psychiatric:        Attention and Perception: She is inattentive.        Speech: Speech normal.        Behavior: Behavior is agitated.        Cognition and Memory: Cognition normal.     Assessment/Plan: Carla [name] is being admitted  for postoperative pain control and further work up. There is very low suspicion for a postoperative wound infection given her surgery was only two days ago. Urinary retention likely secondary to pain. Catheter is in place. Once pain is better controlled, would recommend voiding trial. I will add decadron  for inflammation and hopefully to help with the patient's pain level. At this point, a lumbar MRI would not likely be very helpful due to how recent her surgery was. We will continue to follow.  I am in communication with my attending and they agree with the plan for this patient.   Gerard Beck, DNP, AGNP-C Nurse Practitioner  [facility] [address], [place], [place] [address] P: [phone]    F: [phone]  08/08/2024, 1:04 PM

## 2024-08-08 NOTE — ED Provider Notes (Signed)
  EMERGENCY DEPARTMENT AT [facility] Provider Note   CSN: [id number] Arrival date & time: 08/08/24  9188     Patient presents with: Post-op Problem and Code Sepsis   Carla [name] is a 73 y.o. female.   73 year old female with past medical history of MDS who is 2 days postop from lumbar spinal surgery presenting to the emergency department today with and some generalized weakness.  The patient did have a fall at home this morning to the floor when they were trying to get her up from bed.  Did not hit her head or lose consciousness.  The patient has been complaining of pain and last dose pain medication was yesterday evening.  The patient has had a cough.  She was reportedly lying in bed for the last 18 hours after getting home from surgery due to pain.  They tried to get her up today and that is when she fell.  She found to be mildly tachycardic here on arrival.  She was brought to the ER at that time further evaluation.        Prior to Admission medications   Medication Sig Start Date End Date Taking? Authorizing Provider  ASPIRIN  LOW DOSE 81 MG chewable tablet Chew 81 mg by mouth daily. 03/21/24  Yes [provider]  cetirizine (ZYRTEC) 10 MG tablet Take 10 mg by mouth daily.   Yes [provider]  Darbepoetin Alfa  (ARANESP ) 500 MCG/ML SOSY injection Inject 500 mcg into the skin See admin instructions. Inject 500 mcg into the skin every week as needed   Yes [provider]  docusate sodium (COLACE) 100 MG capsule Take 1 capsule (100 mg total) by mouth 2 (two) times daily. 08/07/24  Yes Mavis Purchase, MD  gabapentin  (NEURONTIN ) 300 MG capsule Take 300 mg by mouth 3 (three) times daily.   Yes [provider]  levothyroxine  (SYNTHROID ) 25 MCG tablet TAKE ONE TAB BY MOUTH ONCE DAILY. TAKE ON AN EMPTY STOMACH WITH A GLASS OF WATER ATLEAST 30-60 MINUTES BEFORE BREAKFAST 07/24/24  Yes [name], [name], MD  luspatercept -aamt (REBLOZYL ) 75  MG subcutaneous injection Inject into the skin every 28 (twenty-eight) days.   Yes [provider]  methocarbamol (ROBAXIN) 500 MG tablet Take 1 tablet (500 mg total) by mouth 3 (three) times daily. Patient taking differently: Take 500 mg by mouth every 8 (eight) hours as needed for muscle spasms. 08/07/24  Yes Mavis Purchase, MD  omeprazole  (PRILOSEC) 20 MG capsule Take 1 capsule (20 mg total) by mouth daily. 07/25/24  Yes [name], [name], MD  oxyCODONE -acetaminophen  (PERCOCET/ROXICET) 5-325 MG tablet Take 1-2 tablets by mouth every 4 (four) hours as needed for severe pain (pain score 7-10). 08/07/24  Yes Mavis Purchase, MD  promethazine  (PHENERGAN ) 25 MG tablet Take 1 tablet (25 mg total) by mouth every 6 (six) hours as needed for nausea or vomiting. Patient taking differently: Take 12.5 mg by mouth every 6 (six) hours. 03/21/24  Yes McBane, Aleck SAILOR, [name]  Vitamin D , Ergocalciferol , (DRISDOL ) 1.25 MG (50000 UNIT) CAPS capsule TAKE 1 CAPSULES BY MOUTH ONCE A WEEK 05/07/24  Yes Brahmanday, Govinda R, MD    Allergies: Codeine    Review of Systems  Respiratory:  Positive for cough.   Musculoskeletal:  Positive for back pain.  All other systems reviewed and are negative.   Updated Vital Signs BP 138/72   Pulse 100   Temp 100 F (37.8 C) (Oral)   Resp 17  Ht 5' 3 (1.6 m)   Wt 70.3 kg   SpO2 92%   BMI 27.46 kg/m   Physical Exam Vitals and nursing note reviewed.   Gen: NAD Eyes: PERRL, EOMI HEENT: no oropharyngeal swelling Neck: trachea midline Resp: Diminished at bilateral lung bases Card: Tachycardic, no murmurs, rubs, or gallops Abd: nontender, nondistended Extremities: no calf tenderness, no edema Vascular: 2+ radial pulses bilaterally, 2+ DP pulses bilaterally Neuro: The patient has equal strength sensation throughout the bilateral lower extremities Skin: Surgical incision is clean, dry with no dehiscence and no significant drainage noted Psyc: acting  appropriately   (all labs ordered are listed, but only abnormal results are displayed) Labs Reviewed  COMPREHENSIVE METABOLIC PANEL WITH GFR - Abnormal; Notable for the following components:      Result Value   Glucose, Bld 124 (*)    Creatinine, Ser 1.09 (*)    Calcium 8.4 (*)    Albumin 3.4 (*)    AST 43 (*)    GFR, Estimated 53 (*)    All other components within normal limits  CBC WITH DIFFERENTIAL/PLATELET - Abnormal; Notable for the following components:   WBC 35.6 (*)    RBC 2.85 (*)    Hemoglobin 8.5 (*)    HCT 28.2 (*)    RDW 28.9 (*)    nRBC 0.8 (*)    Neutro Abs 21.6 (*)    Monocytes Absolute 7.4 (*)    Basophils Absolute 0.2 (*)    Abs Immature Granulocytes 3.53 (*)    All other components within normal limits  PROTIME-INR - Abnormal; Notable for the following components:   Prothrombin Time 17.7 (*)    INR 1.4 (*)    All other components within normal limits  URINALYSIS, W/ REFLEX TO CULTURE (INFECTION SUSPECTED) - Abnormal; Notable for the following components:   Hgb urine dipstick MODERATE (*)    Bacteria, UA RARE (*)    All other components within normal limits  RESP PANEL BY RT-PCR (RSV, FLU A&B, COVID)  RVPGX2  CULTURE, BLOOD (ROUTINE X 2)  CULTURE, BLOOD (ROUTINE X 2)  MAGNESIUM  PATHOLOGIST SMEAR REVIEW  PROCALCITONIN  C-REACTIVE PROTEIN  D-DIMER, QUANTITATIVE (NOT AT [facility])  I-STAT CG4 LACTIC ACID, ED  I-STAT CG4 LACTIC ACID, ED    EKG: None  Radiology: DG Chest Port 1 View if patient is in a treatment room. Result Date: 08/08/2024 EXAM: 1 VIEW(S) XRAY OF THE CHEST 08/08/2024 08:56:00 AM COMPARISON: 05/17/2020 status post left shoulder arthroplasty. CLINICAL HISTORY: Suspected Sepsis. FINDINGS: LUNGS AND PLEURA: Elevated right hemidiaphragm. Minimal right basilar opacity is noted concerning for possible pneumonia or atelectasis. No pulmonary edema. No pleural effusion. No pneumothorax. HEART AND MEDIASTINUM: No acute abnormality of the cardiac and  mediastinal silhouettes. BONES AND SOFT TISSUES: Status post left shoulder arthroplasty. No acute osseous abnormality. IMPRESSION: 1. Minimal right basilar opacity, compatible with atelectasis or pneumonia. Electronically signed by: Lynwood Seip MD 08/08/2024 09:12 AM EST RP Workstation: [id number]     Procedures   Medications Ordered in the ED  lactated ringers  infusion (0 mLs Intravenous Stopped 08/08/24 1404)  aspirin  chewable tablet 81 mg (has no administration in time range)  levothyroxine  (SYNTHROID ) tablet 25 mcg (has no administration in time range)  oxyCODONE -acetaminophen  (PERCOCET/ROXICET) 5-325 MG per tablet 1-2 tablet (1 tablet Oral Given 08/08/24 1355)  acetaminophen  (TYLENOL ) tablet 650 mg (has no administration in time range)    Or  acetaminophen  (TYLENOL ) suppository 650 mg (has no administration in time range)  polyethylene  glycol (MIRALAX / GLYCOLAX) packet 17 g (has no administration in time range)  bisacodyl (DULCOLAX) EC tablet 5 mg (has no administration in time range)  ondansetron  (ZOFRAN ) tablet 4 mg (has no administration in time range)    Or  ondansetron  (ZOFRAN ) injection 4 mg (has no administration in time range)  heparin  injection 5,000 Units (has no administration in time range)  dexamethasone  (DECADRON ) injection 4 mg (has no administration in time range)  cefTRIAXone  (ROCEPHIN ) 2 g in sodium chloride  0.9 % 100 mL IVPB (has no administration in time range)  azithromycin  (ZITHROMAX ) 500 mg in sodium chloride  0.9 % 250 mL IVPB (0 mg Intravenous Stopped 08/08/24 1536)  vancomycin  (VANCOREADY) IVPB 1500 mg/300 mL (1,500 mg Intravenous New Bag/Given 08/08/24 1537)  vancomycin  (VANCOCIN ) IVPB 1000 mg/200 mL premix (has no administration in time range)  HYDROmorphone  (DILAUDID ) injection 1 mg (1 mg Intravenous Given 08/08/24 1451)  fentaNYL  (SUBLIMAZE ) injection 50 mcg (50 mcg Intravenous Given 08/08/24 0856)  lactated ringers  bolus 1,000 mL (0 mLs Intravenous  Stopped 08/08/24 1024)  cefTRIAXone  (ROCEPHIN ) 2 g in sodium chloride  0.9 % 100 mL IVPB (0 g Intravenous Stopped 08/08/24 0950)  azithromycin  (ZITHROMAX ) 500 mg in sodium chloride  0.9 % 250 mL IVPB (0 mg Intravenous Stopped 08/08/24 1026)  fentaNYL  (SUBLIMAZE ) injection 50 mcg (50 mcg Intravenous Given 08/08/24 1108)  dexamethasone  (DECADRON ) injection 10 mg (10 mg Intravenous Given 08/08/24 1300)  lactated ringers  infusion (150 mL/hr Intravenous New Bag/Given 08/08/24 1408)                                    Medical Decision Making 73 year old female with recent lumbar spinal fusion presenting to the emergency department today with shortness of breath, tachycardia, and pulse ox in the 80s after she had lumbar spinal fusion 2 days ago.  The patient's temp on arrival is 99.5.  Initial report was that the patient was febrile but I do not see an actual febrile temperature here.  Will initiate a sepsis workup given her tachycardia and with these complaints.  I will cover the patient for community-acquired pneumonia.  If her x-ray does not show any acute findings given the hypoxia will obtain a CT angiogram for evaluation for pulmonary embolism.  Will give the patient fentanyl  for pain here.  I will give patient a liter of fluid we will hold off on the full sepsis fluid bolus as patient's blood pressures are actually mildly elevated here today and we will hold off until her initial lactate comes back.  I will reevaluate but she will likely require admission given the new oxygen requirement.  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  Chest x-ray does show right lower lobe infiltrate.  A call was placed to neurosurgery to discuss as well as the hospitalist service for admission.  The patient did have some urinary retention and we will discuss her case with neurosurgery to discuss the need for imaging regarding this.  Neurosurgery is seeing the patient  and does not have any specific recommendations.  Patient be admitted to the hospital service for IV antibiotics and further treatment.  Amount and/or Complexity of Data Reviewed Labs: ordered. Radiology: ordered.  Risk Prescription drug management. Decision regarding hospitalization.        Final diagnoses:  Pneumonia due to infectious organism, unspecified laterality, unspecified part of lung  Hypoxia    ED Discharge Orders  None          Ula Prentice SAUNDERS, MD 08/08/24 [phone]

## 2024-08-08 NOTE — H&P (Addendum)
 History and Physical    Patient: Carla [name] FMW:[id number] DOB: 28-Sep-1950 DOA: 08/08/2024 DOS: the patient was seen and examined on 08/08/2024 . PCP: [name] [name], MD  Patient coming from: Home Chief complaint: Chief Complaint  Patient presents with   Post-op Problem   Code Sepsis   HPI:  Carla [name] is a 73 y.o. 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  Per husband at bedside patient was walking with a walker on discharge when she went home she was in the recliner and then they laid her down in bed and since then patient has been not been able to get up because of pain and not wanting to get up.  ED Course:  Vital signs in the ED were notable for the following:  Vitals:   08/08/24 1500 08/08/24 1530 08/08/24 1707 08/08/24 1708  BP: 136/69 138/72 (!) 149/80   Pulse: (!) 102 100 (!) 103 (!) 103  Temp:   98.7 F (37.1 C)   Resp: 13 17 16    Height:      Weight:      SpO2: 96% 92% 92% 93%  TempSrc:   Oral   BMI (Calculated):       >>ED evaluation thus far shows: CMP shows glucose 124 AKI with a creatinine of 1.09 and eGFR of 53 AST of 43. Abnormal CBC with a white count of 35.6 hemoglobin of 8.5 and platelets of 190. Lactic acid of 1.5 and repeat at 1.7. Respiratory panel is negative for flu RSV and COVID. Chest x-ray shows minimal right basilar opacity that Daversa be compatible with atelectasis or pneumonia. EKG today shows sinus tach at 102 normal PR interval normal QRS QTc 421, S waves in lead I Q waves in lead III, no ST elevations, upright axis.  >>While in the ED patient received the following: Medications  lactated ringers  infusion (  Intravenous New Bag/Given 08/08/24 0952)  fentaNYL  (SUBLIMAZE ) injection 50 mcg (50 mcg Intravenous Given 08/08/24 0856)  lactated ringers  bolus 1,000 mL (0 mLs Intravenous Stopped 08/08/24 1024)  cefTRIAXone  (ROCEPHIN ) 2 g in sodium chloride  0.9 % 100 mL IVPB (0 g Intravenous Stopped 08/08/24 0950)  azithromycin  (ZITHROMAX ) 500 mg in sodium chloride  0.9 % 250 mL IVPB (0 mg Intravenous Stopped 08/08/24 1026)  fentaNYL  (SUBLIMAZE ) injection 50 mcg (50 mcg Intravenous Given 08/08/24 1108)   Review of Systems  Constitutional:  Positive for malaise/fatigue.  Musculoskeletal:  Positive for back pain.  Neurological:  Positive for weakness.   Past Medical History:  Diagnosis Date   Allergic rhinitis, cause unspecified    Anemia, unspecified    Arthritis    Cancer (HCC)    myelodysplastic syndrome   Carpal tunnel syndrome    Cataract 2007/2010   Cystitis, unspecified    Family history of osteoporosis    Hypothyroidism    PONV (postoperative nausea and vomiting)    Postnasal drip    Syncope and collapse    Past Surgical History:  Procedure Laterality Date   BACK SURGERY     BONE MARROW BIOPSY     CARPAL TUNNEL RELEASE Bilateral    COLONOSCOPY  09/28/2003   COLONOSCOPY WITH PROPOFOL  N/A 07/31/2015   Procedure: COLONOSCOPY WITH PROPOFOL ;  Surgeon: Lamar Bunk, MD;  Location: [facility];  Service: Endoscopy;  Laterality: N/A;   DIAGNOSTIC LAPAROSCOPY     dx lack of pregnancy    EYE SURGERY     lasik, cataract, prk,yag procedure   REVERSE SHOULDER ARTHROPLASTY Left 03/21/2024   Procedure: ARTHROPLASTY, SHOULDER, TOTAL, REVERSE FOR FRACTURE;  Surgeon: Cristy Bonner DASEN, MD;  Location: [facility];  Service: Orthopedics;  Laterality: Left;   TONSILLECTOMY  1958    reports that she has never smoked. She has never used smokeless tobacco. She reports that she does not currently use alcohol. She reports that she does not use drugs. Allergies  Allergen Reactions   Codeine Nausea Only    Family History  Problem Relation Age of Onset   Osteoporosis Mother    Stroke Mother    Arthritis Mother    Coronary artery disease Father    Stroke Father 59   Arthritis Father    Heart disease Father    Diabetes Other        Aunts and uncles   Breast cancer Paternal Aunt    Breast cancer Maternal Aunt    Arthritis/Rheumatoid Child    Breast cancer Cousin    Cancer Maternal Aunt    Cancer Maternal Aunt    Cancer Paternal Aunt    Diabetes Paternal Aunt    Diabetes Sister    Diabetes Paternal Uncle    Heart disease Paternal Uncle    Stroke Paternal Uncle    Diabetes Paternal Aunt    Stroke Paternal Aunt    Prior to Admission medications   Medication Sig Start Date End Date Taking? Authorizing Provider  ASPIRIN  LOW DOSE 81 MG chewable tablet Chew 81 mg by mouth daily. Patient not taking: Reported on 08/06/2024 03/21/24   [provider]  Darbepoetin Alfa  (ARANESP ) 500 MCG/ML SOSY injection Inject 500 mcg into the skin See admin instructions. Inject 500 mcg into the skin every week as needed    [provider]  docusate sodium (COLACE) 100 MG capsule Take 1 capsule (100 mg total) by mouth 2 (two) times daily. 08/07/24   Mavis Purchase, MD  gabapentin  (NEURONTIN ) 300 MG capsule Take 300 mg by mouth 3 (three) times daily.    [provider]  levothyroxine  (SYNTHROID ) 25 MCG tablet TAKE ONE TAB BY MOUTH ONCE DAILY. TAKE ON AN EMPTY STOMACH WITH A GLASS OF WATER ATLEAST 30-60 MINUTES BEFORE BREAKFAST 07/24/24   [name], [name], MD  luspatercept -aamt (REBLOZYL ) 75 MG subcutaneous injection Inject into the skin every 28 (twenty-eight) days.    [provider]  methocarbamol (ROBAXIN) 500 MG tablet Take 1 tablet (500 mg total) by mouth 3 (three) times daily. 08/07/24   Mavis Purchase, MD  omeprazole  (PRILOSEC) 20 MG capsule Take 1 capsule (20 mg total) by mouth daily. 07/25/24   [name], [name], MD  oxyCODONE -acetaminophen  (PERCOCET/ROXICET) 5-325  MG tablet Take 1-2 tablets by mouth every 4 (four) hours as needed for severe pain (pain score 7-10). 08/07/24   Mavis Purchase, MD  promethazine  (PHENERGAN ) 25 MG tablet Take 1 tablet (25 mg total) by mouth every 6 (six) hours as needed for nausea or vomiting. Patient taking differently: Take 12.5 mg by mouth every 6 (six) hours. 03/21/24   McBane, [name], [name]  Vitamin D , Ergocalciferol , (DRISDOL ) 1.25 MG (50000 UNIT) CAPS capsule TAKE 1 CAPSULES BY MOUTH ONCE A WEEK 05/07/24   Brahmanday, Govinda R, MD  Vitals:   08/08/24 1500 08/08/24 1530 08/08/24 1707 08/08/24 1708  BP: 136/69 138/72 (!) 149/80   Pulse: (!) 102 100 (!) 103 (!) 103  Resp: 13 17 16    Temp:   98.7 F (37.1 C)   TempSrc:   Oral   SpO2: 96% 92% 92% 93%  Weight:      Height:       Physical Exam Vitals reviewed.  Constitutional:      General: She is not in acute distress.    Appearance: She is not ill-appearing.  HENT:     Head: Normocephalic and atraumatic.     Mouth/Throat:     Mouth: Mucous membranes are dry.  Eyes:     Extraocular Movements: Extraocular movements intact.  Cardiovascular:     Rate and Rhythm: Regular rhythm. Tachycardia present.     Pulses: Normal pulses.     Heart sounds: Normal heart sounds.  Pulmonary:     Effort: Pulmonary effort is normal.     Breath sounds: Normal breath sounds.  Abdominal:     General: There is no distension.     Palpations: Abdomen is soft.     Tenderness: There is no abdominal tenderness.  Musculoskeletal:     Right lower leg: No edema.     Left lower leg: No edema.  Neurological:     Mental Status: She is oriented to person, place, and time and easily aroused. She is lethargic.  Psychiatric:        Speech: Speech normal.     Labs on Admission: I have personally reviewed following labs and imaging studies CBC: Recent Labs  Lab 08/08/24 0840  WBC 35.6*  NEUTROABS 21.6*   HGB 8.5*  HCT 28.2*  MCV 98.9  PLT 190   Basic Metabolic Panel: Recent Labs  Lab 08/08/24 0840  NA 135  K 4.1  CL 99  CO2 23  GLUCOSE 124*  BUN 13  CREATININE 1.09*  CALCIUM 8.4*  MG 1.8   GFR: Estimated Creatinine Clearance: 42.6 mL/min (A) (by C-G formula based on SCr of 1.09 mg/dL (H)). Liver Function Tests: Recent Labs  Lab 08/08/24 0840  AST 43*  ALT 18  ALKPHOS 39  BILITOT 1.2  PROT 7.5  ALBUMIN 3.4*   No results for input(s): LIPASE, AMYLASE in the last 168 hours. No results for input(s): AMMONIA in the last 168 hours. Recent Labs    11/29/23 0934 12/28/23 0856 01/25/24 1002 02/23/24 1020 03/28/24 0903 04/25/24 1444 05/23/24 0914 06/20/24 1245 07/19/24 1102 08/08/24 0840  BUN 21 23 21 19 18  26* 32* 41* 26* 13  CREATININE 1.16* 1.13* 1.01* 1.02* 0.84 0.92 1.09* 1.22* 1.13* 1.09*    Cardiac Enzymes: No results for input(s): CKTOTAL, CKMB, CKMBINDEX, TROPONINI in the last 168 hours. BNP (last 3 results) No results for input(s): PROBNP in the last 8760 hours. HbA1C: No results for input(s): HGBA1C in the last 72 hours. CBG: No results for input(s): GLUCAP in the last 168 hours. Lipid Profile: No results for input(s): CHOL, HDL, LDLCALC, TRIG, CHOLHDL, LDLDIRECT in the last 72 hours. Thyroid  Function Tests: No results for input(s): TSH, T4TOTAL, FREET4, T3FREE, THYROIDAB in the last 72 hours. Anemia Panel: No results for input(s): VITAMINB12, FOLATE, FERRITIN, TIBC, IRON, RETICCTPCT in the last 72 hours. Urine analysis:    Component Value Date/Time   COLORURINE YELLOW 08/08/2024 1126   APPEARANCEUR CLEAR 08/08/2024 1126   LABSPEC 1.019 08/08/2024 1126   PHURINE 5.0 08/08/2024 1126   GLUCOSEU NEGATIVE  08/08/2024 1126   HGBUR MODERATE (A) 08/08/2024 1126   HGBUR large 07/22/2009 1526   BILIRUBINUR NEGATIVE 08/08/2024 1126   KETONESUR NEGATIVE 08/08/2024 1126   PROTEINUR NEGATIVE  08/08/2024 1126   UROBILINOGEN 0.2 07/22/2009 1526   NITRITE NEGATIVE 08/08/2024 1126   LEUKOCYTESUR NEGATIVE 08/08/2024 1126   Radiological Exams on Admission: DG Chest Port 1 View if patient is in a treatment room. Result Date: 08/08/2024 EXAM: 1 VIEW(S) XRAY OF THE CHEST 08/08/2024 08:56:00 AM COMPARISON: 05/17/2020 status post left shoulder arthroplasty. CLINICAL HISTORY: Suspected Sepsis. FINDINGS: LUNGS AND PLEURA: Elevated right hemidiaphragm. Minimal right basilar opacity is noted concerning for possible pneumonia or atelectasis. No pulmonary edema. No pleural effusion. No pneumothorax. HEART AND MEDIASTINUM: No acute abnormality of the cardiac and mediastinal silhouettes. BONES AND SOFT TISSUES: Status post left shoulder arthroplasty. No acute osseous abnormality. IMPRESSION: 1. Minimal right basilar opacity, compatible with atelectasis or pneumonia. Electronically signed by: Lynwood Seip MD 08/08/2024 09:12 AM EST RP Workstation: [id number]   Data Reviewed: Relevant notes from primary care and specialist visits, past discharge summaries as available in EHR, including Care Everywhere . Prior diagnostic testing as pertinent to current admission diagnoses, Updated medications and problem lists for reconciliation .ED course, including vitals, labs, imaging, treatment and response to treatment,Triage notes, nursing and pharmacy notes and ED provider's notes.Notable results as noted in HPI.Discussed case with EDMD/ ED APP/ or Specialty MD on call and as needed.  Assessment & Plan   >> Generalized weakness: Attributed to dehydration and possible underlying infection, fall precaution, IV fluid rehydration electrolyte correction monitoring.   >> Suspected sepsis: With patient's recent postop status and degree of leukocytosis and the rise from her previous week I am concerned that this is an infectious and will cover patient empirically with broad-spectrum antibiotics for possible postop infection  along with pneumonia. Currently patient on LR at 150 x 1.  Patient has already received 1 L of LR.  Lactic acid is within normal limits.  Total bili is normal. Blood pressure is stable and will continue with maintenance IV fluid hydration.  >> Hypoxia: Patient O2 sats noted to be 85% on arrival and was started on 2 L nasal cannula.  Will obtain a D-dimer and consider VTE evaluation based on same.  EKG shows deep S wave in lead I and a small Q wave.   >> Anemia:  Patient is currently receiving Aranesp  has a history of MDS her counts are good on contrary patient has leukocytosis.  Will type and screen, IV PPI and follow H&H further evaluate if hemoglobin drops. Last hematology visit in October 2025 shows patient has low-grade MDS.  Leukocytosis attributed to patient's steroid use for her low back pain.   >>AKI on CKD stage 3a: Lab Results  Component Value Date   CREATININE 1.09 (H) 08/08/2024   CREATININE 1.13 (H) 07/19/2024   CREATININE 1.22 (H) 06/20/2024  Stable will cont with IVF hydration and limit contrast with CIN prevention measure with ivf and or bicarb depending on preference.   >>Prediabetes: Last A1c of 6.1 06/2024.High risk for transition for DM II. Diet education prior to discharge, and nutrition followup per pcp.    >>GERD: IV PPI.   DVT prophylaxis:  Heparin .   Consults:  NS.  Advance Care Planning:    Code Status: Full Code   Family Communication:  Spouse.   Disposition Plan:  TBD.   Severity of Illness: The appropriate patient status for this patient is INPATIENT. Inpatient status is judged  to be reasonable and necessary in order to provide the required intensity of service to ensure the patient's safety. The patient's presenting symptoms, physical exam findings, and initial radiographic and laboratory data in the context of their chronic comorbidities is felt to place them at high risk for further clinical deterioration. Furthermore, it is not  anticipated that the patient will be medically stable for discharge from the hospital within 2 midnights of admission.   * I certify that at the point of admission it is my clinical judgment that the patient will require inpatient hospital care spanning beyond 2 midnights from the point of admission due to high intensity of service, high risk for further deterioration and high frequency of surveillance required.*  Unresulted Labs (From admission, onward)     Start     Ordered   08/09/24 0500  Protime-INR  Tomorrow morning,   R        08/08/24 1311   08/09/24 0500  Cortisol-am, blood  Tomorrow morning,   R        08/08/24 1311   08/09/24 0500  CBC  Tomorrow morning,   R        08/08/24 1311   08/09/24 0500  Comprehensive metabolic panel  Tomorrow morning,   R        08/08/24 1311   08/09/24 0500  Magnesium  Tomorrow morning,   R        08/08/24 1311   08/08/24 1615  C-reactive protein  Once,   R        08/08/24 1615   08/08/24 1615  D-dimer, quantitative  Once,   R        08/08/24 1615   08/08/24 1449  Procalcitonin  Add-on,   AD        08/08/24 1448   08/08/24 0840  Pathologist smear review  Once,   R        08/08/24 0840   08/08/24 0831  Culture, blood (Routine x 2)  BLOOD CULTURE X 2,   R (with STAT occurrences)      08/08/24 0830            Meds ordered this encounter  Medications   fentaNYL  (SUBLIMAZE ) injection 50 mcg   lactated ringers  infusion   lactated ringers  bolus 1,000 mL    Reason 30 mL/kg dose is not being ordered:   First Lactic Acid Pending   cefTRIAXone  (ROCEPHIN ) 2 g in sodium chloride  0.9 % 100 mL IVPB    Antibiotic Indication::   CAP   azithromycin  (ZITHROMAX ) 500 mg in sodium chloride  0.9 % 250 mL IVPB    Antibiotic Indication::   CAP   fentaNYL  (SUBLIMAZE ) injection 50 mcg   dexamethasone  (DECADRON ) injection 10 mg   aspirin  chewable tablet 81 mg   levothyroxine  (SYNTHROID ) tablet 25 mcg   oxyCODONE -acetaminophen  (PERCOCET/ROXICET) 5-325 MG per  tablet 1-2 tablet    Refill:  0   lactated ringers  infusion   OR Linked Order Group    acetaminophen  (TYLENOL ) tablet 650 mg    acetaminophen  (TYLENOL ) suppository 650 mg   polyethylene glycol (MIRALAX / GLYCOLAX) packet 17 g   bisacodyl (DULCOLAX) EC tablet 5 mg   OR Linked Order Group    ondansetron  (ZOFRAN ) tablet 4 mg    ondansetron  (ZOFRAN ) injection 4 mg   heparin  injection 5,000 Units   dexamethasone  (DECADRON ) injection 4 mg   cefTRIAXone  (ROCEPHIN ) 2 g in sodium chloride  0.9 % 100 mL IVPB    Antibiotic  Indication::   CAP   azithromycin  (ZITHROMAX ) 500 mg in sodium chloride  0.9 % 250 mL IVPB    Antibiotic Indication::   CAP   vancomycin  (VANCOREADY) IVPB 1500 mg/300 mL    Indication::   Other Indication (list below)    Other Indication::   Discitis   vancomycin  (VANCOCIN ) IVPB 1000 mg/200 mL premix    Indication::   Other Indication (list below)    Other Indication::   Discitis   HYDROmorphone  (DILAUDID ) injection 1 mg     Orders Placed This Encounter  Procedures   Culture, blood (Routine x 2)   Resp panel by RT-PCR (RSV, Flu A&B, Covid) Anterior Nasal Swab   DG Chest Port 1 View if patient is in a treatment room.   DG Hand 2 View Left   Comprehensive metabolic panel   CBC with Differential   Protime-INR   Urinalysis, w/ Reflex to Culture (Infection Suspected) -Urine, Clean Catch   Pathologist smear review   Protime-INR   Cortisol-am, blood   CBC   Comprehensive metabolic panel   Magnesium   Magnesium   Procalcitonin   C-reactive protein   D-dimer, quantitative   Diet full liquid Room service appropriate? Yes; Fluid consistency: Thin   Notify physician (specify)  Specify: Notify provider for possible Code Sepsis   Document height and weight   Insert / maintain saline lock   Assess and Document Glasgow Coma Scale   Document vital signs within 1-hour of fluid bolus completion. Notify provider of abnormal vital signs despite fluid resuscitation.   DO NOT delay  antibiotics if unable to obtain blood culture.   Refer to Sidebar Report: Sepsis Sidebar ED/IP   Notify provider for difficulties obtaining IV access.   Insert peripheral IV x 2   Initiate Carrier Fluid Protocol   Insert foley catheter   Cardiac Monitoring Continuous x 24 hours Indications for use: Other; other indications for use: Sepsis   Vital signs   Notify physician (specify)   Mobility Protocol: No Restrictions   Refer to Sidebar Report Mobility Protocol for Adult Inpatient   If lactate (lactic acid) >2, verify repeat lactic acid order has been placed to be drawn   Document vital signs within 1-hour of fluid bolus completion and notify provider of bolus completion   Vital signs   Vital signs   RN to call RRT (rapid response team)   Notify physician (specify) If patient in A-Fib, change in heart rhythm, or HR > 125 beat/min   Initiate Adult Central Line Maintenance and Catheter Clearance Protocol for patients with central line (CVC, PICC, Port, Hemodialysis, Trialysis)   Apply Sepsis Care Plan   Refer to Sidebar Report: Sepsis Bundle ED/IP   Assess and Document Glasgow Coma Scale   Initiate Oral Care Protocol   Initiate Carrier Fluid Protocol   RN Coard order General Admission PRN Orders utilizing General Admission PRN medications (through manage orders) for the following patient needs: allergy symptoms (Claritin), cold sores (Carmex), cough (Robitussin DM), eye irritation (Liquifilm Tears), hemorrhoids (Tucks), indigestion (Maalox), minor skin irritation (Hydrocortisone Cream), muscle pain (Ben Gay), nose irritation (saline nasal spray) and sore throat (Chloraseptic spray).   Swallow screen   Full code   Consult to neurosurgery   Consult to hospitalist   Consult to neurosurgery   vancomycin  per pharmacy consult   OT eval and treat   PT eval and treat   Pulse oximetry check with vital signs   Pulse oximetry (single)   I-Stat Lactic Acid,  ED   ED EKG   EKG 12-Lead   EKG  12-Lead   Admit to Inpatient (patient's expected length of stay will be greater than 2 midnights or inpatient only procedure)   Fall precautions   Aspiration precautions    Author: Mario LULLA Blanch, MD 12 pm- 8 pm. [facility]. 08/08/2024 6:26 PM Please note for any communication after hours contact [facility] Assigned provider on call on Amion.

## 2024-08-08 NOTE — Progress Notes (Addendum)
 Left arm swollen with redness at old IV site and painful to touch. Warm compress applied and Left arm elevated for possible infiltration per on call provider.

## 2024-08-09 ENCOUNTER — Inpatient Hospital Stay (HOSPITAL_COMMUNITY)

## 2024-08-09 ENCOUNTER — Telehealth: Payer: Self-pay

## 2024-08-09 DIAGNOSIS — A419 Sepsis, unspecified organism: Secondary | ICD-10-CM | POA: Diagnosis not present

## 2024-08-09 LAB — CBC
HCT: 25.2 % — ABNORMAL LOW (ref 36.0–46.0)
Hemoglobin: 7.9 g/dL — ABNORMAL LOW (ref 12.0–15.0)
MCH: 30.4 pg (ref 26.0–34.0)
MCHC: 31.3 g/dL (ref 30.0–36.0)
MCV: 96.9 fL (ref 80.0–100.0)
Platelets: 168 K/uL (ref 150–400)
RBC: 2.6 MIL/uL — ABNORMAL LOW (ref 3.87–5.11)
RDW: 28.3 % — ABNORMAL HIGH (ref 11.5–15.5)
WBC: 45.3 K/uL — ABNORMAL HIGH (ref 4.0–10.5)
nRBC: 0.7 % — ABNORMAL HIGH (ref 0.0–0.2)

## 2024-08-09 LAB — COMPREHENSIVE METABOLIC PANEL WITH GFR
ALT: 18 U/L (ref 0–44)
AST: 35 U/L (ref 15–41)
Albumin: 2.7 g/dL — ABNORMAL LOW (ref 3.5–5.0)
Alkaline Phosphatase: 34 U/L — ABNORMAL LOW (ref 38–126)
Anion gap: 11 (ref 5–15)
BUN: 11 mg/dL (ref 8–23)
CO2: 23 mmol/L (ref 22–32)
Calcium: 8.7 mg/dL — ABNORMAL LOW (ref 8.9–10.3)
Chloride: 98 mmol/L (ref 98–111)
Creatinine, Ser: 0.75 mg/dL (ref 0.44–1.00)
GFR, Estimated: >60 mL/min (ref >60)
Glucose, Bld: 123 mg/dL — ABNORMAL HIGH (ref 70–99)
Potassium: 4.4 mmol/L (ref 3.5–5.1)
Sodium: 132 mmol/L — ABNORMAL LOW (ref 135–145)
Total Bilirubin: 0.7 mg/dL (ref 0.0–1.2)
Total Protein: 6.8 g/dL (ref 6.5–8.1)

## 2024-08-09 LAB — PATHOLOGIST SMEAR REVIEW

## 2024-08-09 LAB — CORTISOL-AM, BLOOD: Cortisol - AM: 2.3 ug/dL — ABNORMAL LOW (ref 6.7–22.6)

## 2024-08-09 LAB — MAGNESIUM: Magnesium: 1.7 mg/dL (ref 1.7–2.4)

## 2024-08-09 LAB — PROTIME-INR
INR: 1.4 — ABNORMAL HIGH (ref 0.8–1.2)
Prothrombin Time: 17.5 s — ABNORMAL HIGH (ref 11.4–15.2)

## 2024-08-09 MED ORDER — METHOCARBAMOL 500 MG PO TABS
500.0000 mg | ORAL_TABLET | Freq: Three times a day (TID) | ORAL | Status: DC | PRN
Start: 2024-08-09 — End: 2024-08-14
  Administered 2024-08-10 – 2024-08-11 (×2): 500 mg via ORAL
  Filled 2024-08-09 (×2): qty 1

## 2024-08-09 MED ORDER — IOHEXOL 350 MG/ML SOLN
75.0000 mL | Freq: Once | INTRAVENOUS | Status: AC | PRN
  Administered 2024-08-09: 75 mL via INTRAVENOUS

## 2024-08-09 MED ORDER — HYDROMORPHONE HCL 1 MG/ML IJ SOLN: 0.5000 mg | Freq: Four times a day (QID) | INTRAMUSCULAR | Status: DC | PRN

## 2024-08-09 MED ORDER — GABAPENTIN 300 MG PO CAPS
300.0000 mg | ORAL_CAPSULE | Freq: Three times a day (TID) | ORAL | Status: DC
  Administered 2024-08-09 – 2024-08-14 (×16): 300 mg via ORAL
  Filled 2024-08-09 (×2): qty 1
  Filled 2024-08-09: qty 3
  Filled 2024-08-09 (×5): qty 1
  Filled 2024-08-09: qty 3
  Filled 2024-08-09 (×7): qty 1

## 2024-08-09 MED ORDER — OXYCODONE HCL 5 MG PO TABS
5.0000 mg | ORAL_TABLET | Freq: Four times a day (QID) | ORAL | Status: DC | PRN
  Administered 2024-08-09 – 2024-08-14 (×5): 5 mg via ORAL
  Filled 2024-08-09 (×5): qty 1

## 2024-08-09 MED ORDER — SENNOSIDES-DOCUSATE SODIUM 8.6-50 MG PO TABS
1.0000 | ORAL_TABLET | Freq: Every day | ORAL | Status: DC
  Administered 2024-08-09 – 2024-08-10 (×2): 1 via ORAL
  Filled 2024-08-09 (×2): qty 1

## 2024-08-09 MED ORDER — ACETAMINOPHEN 500 MG PO TABS
1000.0000 mg | ORAL_TABLET | Freq: Three times a day (TID) | ORAL | Status: DC
  Administered 2024-08-09 – 2024-08-14 (×15): 1000 mg via ORAL
  Filled 2024-08-09 (×15): qty 2

## 2024-08-09 NOTE — Plan of Care (Signed)
   Problem: Education: Goal: Knowledge of General Education information will improve Description Including pain rating scale, medication(s)/side effects and non-pharmacologic comfort measures Outcome: Progressing   Problem: Health Behavior/Discharge Planning: Goal: Ability to manage health-related needs will improve Outcome: Progressing

## 2024-08-09 NOTE — Evaluation (Signed)
 Occupational Therapy Evaluation Patient Details Name: Harlo Jaso Mentzer MRN: [id number] DOB: June 26, 1951 Today's Date: 08/09/2024   History of Present Illness   Danika Kluender Rabe is a 73 y.o. female admitted 08/08/24 for sepsis after L4-5 PLIF (11/10). Chest x-ray shows minimal right basilar opacity that [place] be compatible with atelectasis or pneumonia. PMHx: CA, carpal tunnel release (bilateral), osteoporosis, hypothyroidism, syncope, collapse, myelodysplastic syndrome, and RSA.     Clinical Impressions Pt admitted with above. Prior to admission, pt recently discharged after L4-5 PLIF on 11/10 and has not been out of bed since returning home, fell when husband attempted to help her up. At baseline, pt uses a rollator for ambulation with spouse assisting with ADLs including LB dressing / bathing. Pt lethargic, limited by pain and often moans help me but cannot verbalize further. Unable to recall spinal precautions. Requires +2 for log rolling in bed, pt reaches for bed rail with multimodal cuing and is able to dangle at EOB with BUE/BLE support fading to CGA for safety. RN called into room to assess drainage at honeycomb dressing site. Anticipate pt will require MAX A for all ADLs at bed level with +2 for rolling 2/2 spinal precautions. Further mobility deferred as spinal brace not present in room (sister made aware of need to progress mobility, will bring from home). Pt would benefit from skilled OT services to address noted impairments and functional limitations (see below for any additional details) in order to maximize safety and independence while minimizing falls risk and caregiver burden. Anticipate the need for follow up OT services upon acute hospital DC. Patient will benefit from continued inpatient follow up therapy, <3 hours/day.     If plan is discharge home, recommend the following:   Two people to help with walking and/or transfers;Two people to help with bathing/dressing/bathroom;Direct  supervision/assist for financial management;Direct supervision/assist for medications management;Assist for transportation;Help with stairs or ramp for entrance;Supervision due to cognitive status     Functional Status Assessment   Patient has had a recent decline in their functional status and demonstrates the ability to make significant improvements in function in a reasonable and predictable amount of time.     Equipment Recommendations   None recommended by OT (defer to next LOC)      Precautions/Restrictions   Precautions Precautions: Back;Fall Precaution Booklet Issued: No Recall of Precautions/Restrictions: Impaired Required Braces or Orthoses: Spinal Brace Spinal Brace: Lumbar corset;Applied in sitting position (sister to bring brace from home) Restrictions Weight Bearing Restrictions Per Provider Order: No     Mobility Bed Mobility Overal bed mobility: Needs Assistance Bed Mobility: Rolling, Sidelying to Sit, Sit to Sidelying Rolling: Max assist, +2 for physical assistance, Used rails Sidelying to sit: Max assist, Total assist, +2 for physical assistance, Used rails     Sit to sidelying: Used rails, Total assist, Max assist, +2 for physical assistance General bed mobility comments: requires assist for trunk and BLE. pt reaches for bed rail, but requires multimodal cues for log roll technique and back precautions    Transfers Overall transfer level: Needs assistance                 General transfer comment: NT      Balance Overall balance assessment: Needs assistance, History of Falls Sitting-balance support: Feet supported, Bilateral upper extremity supported Sitting balance-Leahy Scale: Poor Sitting balance - Comments: requires MOD for initial supported sitting fading to CGA  ADL either performed or assessed with clinical judgement   ADL Overall ADL's : Needs assistance/impaired Eating/Feeding:  Bed level;Minimal assistance   Grooming: Bed level;Maximal assistance   Upper Body Bathing: Bed level;Maximal assistance   Lower Body Bathing: Bed level;Maximal assistance;+2 for physical assistance   Upper Body Dressing : Bed level;Maximal assistance   Lower Body Dressing: Bed level;Maximal assistance;Cueing for back precautions     Toilet Transfer Details (indicate cue type and reason): unable to attempt secondary to pain, level of alertness and spinal corset not in room. Toileting- Clothing Manipulation and Hygiene: Maximal assistance;Bed level Toileting - Clothing Manipulation Details (indicate cue type and reason): Foley, will need bedpan due to lack of spinal brace per orders     Functional mobility during ADLs: Maximal assistance;Total assistance;+2 for physical assistance;+2 for safety/equipment;Cueing for safety;Cueing for sequencing General ADL Comments: pt lethargic, engages minimally with therapy team, grossly limited by pain. able to sit at EOB while nursing came in to assess drainage in honeycomb dressing. anticipate need for bed level ADLs until pain better controlled and spinal brace brought in from home.     Vision Baseline Vision/History: 0 No visual deficits Ability to See in Adequate Light: 0 Adequate Patient Visual Report: No change from baseline              Pertinent Vitals/Pain Pain Assessment Pain Assessment: Faces Faces Pain Scale: Hurts whole lot Pain Location: back, generalized Pain Descriptors / Indicators: Sore, Moaning, Grimacing, Discomfort Pain Intervention(s): Limited activity within patient's tolerance, Patient requesting pain meds-RN notified, Repositioned, Monitored during session     Extremity/Trunk Assessment Upper Extremity Assessment Upper Extremity Assessment: Generalized weakness;Right hand dominant LUE Deficits / Details: Mildly limited shoulder ROM consistent with prior RSA   Lower Extremity Assessment Lower Extremity  Assessment: Defer to PT evaluation   Cervical / Trunk Assessment Cervical / Trunk Assessment: Back Surgery   Communication Communication Communication: No apparent difficulties   Cognition Arousal: Lethargic Behavior During Therapy: Flat affect Cognition: Cognition impaired   Orientation impairments: Time, Situation Awareness: Intellectual awareness impaired Memory impairment (select all impairments): Short-term memory, Working memory, Non-declarative long-term memory Attention impairment (select first level of impairment): Focused attention Executive functioning impairment (select all impairments): Problem solving, Reasoning OT - Cognition Comments: pt lethargic, exhibits minimal participation, unable to recall back precautions.                 Following commands: Impaired Following commands impaired: Follows one step commands inconsistently     Cueing  General Comments   Cueing Techniques: Verbal cues;Gestural cues  Honeycomb dressing noted to have drainage, called RN in to assess further. VSS on 3L via [place].           Home Living Family/patient expects to be discharged to:: Skilled nursing facility Living Arrangements: Spouse/significant other Available Help at Discharge: Family;Available 24 hours/day Type of Home: House Home Access: Ramped entrance     Home Layout: Two level;Able to live on main level with bedroom/bathroom     Bathroom Shower/Tub: Producer, Television/film/video: Standard     Home Equipment: Agricultural Consultant (2 wheels);Rollator (4 wheels);BSC/3in1;Shower seat - built in          Prior Functioning/Environment Prior Level of Function : Needs assist             Mobility Comments: Sister in Law reports pt didn't walk any at all after discharging home from surgery ADLs Comments: Husband assisting with ADL's including lower body dressing and bathing.  OT Problem List: Decreased strength;Decreased range of motion;Decreased activity  tolerance;Impaired balance (sitting and/or standing);Decreased cognition;Pain;Decreased knowledge of precautions;Decreased knowledge of use of DME or AE;Decreased safety awareness   OT Treatment/Interventions: Balance training;Patient/family education;Therapeutic activities;DME and/or AE instruction;Therapeutic exercise;Self-care/ADL training      OT Goals(Current goals can be found in the care plan section)   Acute Rehab OT Goals OT Goal Formulation: With family Time For Goal Achievement: 08/23/24 Potential to Achieve Goals: Good ADL Goals Pt Will Perform Grooming: sitting;with set-up;standing Pt Will Perform Lower Body Bathing: with adaptive equipment;sit to/from stand;sitting/lateral leans;with caregiver independent in assisting;with min assist Pt Will Perform Lower Body Dressing: with caregiver independent in assisting;with adaptive equipment;sit to/from stand;sitting/lateral leans;with min assist Pt Will Transfer to Toilet: ambulating;with min assist;grab bars;bedside commode Pt Will Perform Toileting - Clothing Manipulation and hygiene: with min assist;sit to/from stand;sitting/lateral leans   OT Frequency:  Min 2X/week    Co-evaluation PT/OT/SLP Co-Evaluation/Treatment: Yes Reason for Co-Treatment: For patient/therapist safety;To address functional/ADL transfers PT goals addressed during session: Mobility/safety with mobility;Balance OT goals addressed during session: ADL's and self-care      AM-PAC OT 6 Clicks Daily Activity     Outcome Measure Help from another person eating meals?: A Lot Help from another person taking care of personal grooming?: A Lot Help from another person toileting, which includes using toliet, bedpan, or urinal?: Total Help from another person bathing (including washing, rinsing, drying)?: Total Help from another person to put on and taking off regular upper body clothing?: Total Help from another person to put on and taking off regular lower  body clothing?: Total 6 Click Score: 8   End of Session Equipment Utilized During Treatment: Oxygen Nurse Communication: Mobility status;Precautions (need spinal brace for OOB)  Activity Tolerance: Patient limited by pain;Patient limited by lethargy Patient left: in bed;with call bell/phone within reach;with nursing/sitter in room;with family/visitor present;with bed alarm set  OT Visit Diagnosis: Pain;Muscle weakness (generalized) (M62.81);History of falling (Z91.81);Unsteadiness on feet (R26.81) Pain - Right/Left:  (back, generalized) Pain - part of body:  (back)                Time: 9198-9168 OT Time Calculation (min): 30 min Charges:  OT General Charges $OT Visit: 1 Visit OT Evaluation $OT Eval Moderate Complexity: 1 Mod  Leiloni Smithers L. Verenis Nicosia, OTR/L  08/09/24, 9:43 AM

## 2024-08-09 NOTE — NC FL2 (Signed)
 [employer]  MEDICAID FL2 LEVEL OF CARE FORM     IDENTIFICATION  Patient Name: Carla [name] Birthdate: 02-13-1951 Sex: female Admission Date (Current Location): 08/08/2024  [facility] and [place] Number:  Producer, Television/film/video and Address:  [facility], [address] [place], [place] [address]      Provider Number: [id number]  Attending Physician Name and Address:  Arlice Reichert, MD  Relative Name and Phone Number:       Current Level of Care: Hospital Recommended Level of Care: Skilled Nursing Facility Prior Approval Number:    Date Approved/Denied:   PASRR Number: [id number]  Discharge Plan: SNF    Current Diagnoses: Patient Active Problem List   Diagnosis Date Noted   Sepsis (HCC) 08/08/2024   Spondylolisthesis of lumbar region 08/06/2024   Lipid screening 07/24/2024   Current use of proton pump inhibitor 07/24/2024   Immunocompromised 07/13/2023   Prediabetes 07/03/2023   Chronic kidney disease (CKD) stage G3a/A1, moderately decreased glomerular filtration rate (GFR) between 45-59 mL/min/1.73 square meter and albuminuria creatinine ratio less than 30 mg/g (HCC) 07/03/2023   Neuropathy 05/27/2023   Nontraumatic incomplete tear of right rotator cuff 05/03/2022   Rotator cuff tendinitis, right 05/03/2022   Tendinitis of upper biceps tendon of right shoulder 05/03/2022   Encounter for screening mammogram for breast cancer 04/27/2021   Vitamin D  deficiency 04/27/2021   Symptomatic anemia 05/17/2020   Goals of care, counseling/discussion 04/15/2020   MDS (myelodysplastic syndrome), 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   Hypothyroidism 12/20/2017   Normocytic anemia 12/19/2017   Estrogen deficiency 10/10/2015   Encounter for routine gynecological examination 06/22/2011   Routine general medical examination at a health care facility 06/15/2011   Allergic rhinitis 06/26/2008    Orientation RESPIRATION BLADDER Height & Weight     Self, Time, Place  (fluctuates)  O2 (3L [place]) Continent Weight: 155 lb (70.3 kg) Height:  5' 3 (160 cm)  BEHAVIORAL SYMPTOMS/MOOD NEUROLOGICAL BOWEL NUTRITION STATUS      Continent    AMBULATORY STATUS COMMUNICATION OF NEEDS Skin   Extensive Assist   Surgical wounds                       Personal Care Assistance Level of Assistance  Bathing, Feeding, Dressing Bathing Assistance: Maximum assistance Feeding assistance: Limited assistance Dressing Assistance: Maximum assistance     Functional Limitations Info  Sight, Hearing, Speech Sight Info: Adequate Hearing Info: Adequate Speech Info: Adequate    SPECIAL CARE FACTORS FREQUENCY  PT (By licensed PT), OT (By licensed OT)                    Contractures Contractures Info: Not present    Additional Factors Info                  Current Medications (08/09/2024):  This is the current hospital active medication list Current Facility-Administered Medications  Medication Dose Route Frequency Provider Last Rate Last Admin   acetaminophen  (TYLENOL ) tablet 650 mg  650 mg Oral Q6H PRN Patel, Ekta V, MD       Or   acetaminophen  (TYLENOL ) suppository 650 mg  650 mg Rectal Q6H PRN Patel, Ekta V, MD       aspirin  chewable tablet 81 mg  81 mg Oral Daily Patel, Ekta V, MD   81 mg at 08/09/24 0842   azithromycin  (ZITHROMAX ) 500 mg in sodium chloride  0.9 % 250 mL IVPB  500 mg Intravenous  Q24H Tobie Mario GAILS, MD   Stopped at 08/08/24 1536   bisacodyl (DULCOLAX) EC tablet 5 mg  5 mg Oral Daily PRN Patel, Ekta V, MD       cefTRIAXone  (ROCEPHIN ) 2 g in sodium chloride  0.9 % 100 mL IVPB  2 g Intravenous Q24H Patel, Ekta V, MD 200 mL/hr at 08/09/24 0904 2 g at 08/09/24 0904   Chlorhexidine  Gluconate Cloth 2 % PADS 6 each  6 each Topical Q0600 Tobie Mario GAILS, MD   6 each at 08/09/24 9376   Chlorhexidine  Gluconate Cloth 2 % PADS 6 each  6 each Topical Daily Tobie Mario GAILS, MD   6 each at 08/09/24 1049   dexamethasone  (DECADRON ) injection 4 mg  4 mg  Intravenous Q6H Bergman, Meghan D, NP   4 mg at 08/09/24 1100   gabapentin  (NEURONTIN ) capsule 300 mg  300 mg Oral TID Dahal, Binaya, MD   300 mg at 08/09/24 1049   heparin  injection 5,000 Units  5,000 Units Subcutaneous Q8H Patel, Ekta V, MD   5,000 Units at 08/09/24 0618   HYDROmorphone  (DILAUDID ) injection 1 mg  1 mg Intravenous Q6H PRN Patel, Ekta V, MD   1 mg at 08/09/24 9157   levothyroxine  (SYNTHROID ) tablet 25 mcg  25 mcg Oral Q0600 Patel, Ekta V, MD   25 mcg at 08/09/24 9383   methocarbamol (ROBAXIN) tablet 500 mg  500 mg Oral Q8H PRN Dahal, Chapman, MD       ondansetron  (ZOFRAN ) tablet 4 mg  4 mg Oral Q6H PRN Patel, Ekta V, MD       Or   ondansetron  (ZOFRAN ) injection 4 mg  4 mg Intravenous Q6H PRN Patel, Ekta V, MD       oxyCODONE -acetaminophen  (PERCOCET/ROXICET) 5-325 MG per tablet 1-2 tablet  1-2 tablet Oral Q4H PRN Patel, Ekta V, MD   1 tablet at 08/09/24 0616   polyethylene glycol (MIRALAX / GLYCOLAX) packet 17 g  17 g Oral Daily PRN Patel, Ekta V, MD       senna-docusate (Senokot-S) tablet 1 tablet  1 tablet Oral QHS Dahal, Chapman, MD       vancomycin  (VANCOCIN ) IVPB 1000 mg/200 mL premix  1,000 mg Intravenous Q24H Denninger, Jade M, RPH       Facility-Administered Medications Ordered in Other Encounters  Medication Dose Route Frequency Provider Last Rate Last Admin   heparin  lock flush 100 unit/mL  500 Units Intravenous Once Brahmanday, Govinda R, MD       sodium chloride  flush (NS) 0.9 % injection 10 mL  10 mL Intravenous PRN Brahmanday, Govinda R, MD         Discharge Medications: Please see discharge summary for a list of discharge medications.  Relevant Imaging Results:  Relevant Lab Results:   Additional Information SS# [id number]  Gwenn Julien [place], [place]

## 2024-08-09 NOTE — Telephone Encounter (Signed)
 Patient's son, Joane, calling to let Dr. B know that Ms. Giebler is in [place] [facility].  They are concerned about her condition and fluctuating lab results.  They wanted to know if Dr. B could speak to anyone on her care team at [facility] to discuss her care.

## 2024-08-09 NOTE — Evaluation (Signed)
 Physical Therapy Evaluation Patient Details Name: Carla [name] MRN: [id number] DOB: 29-Dec-1950 Today's Date: 08/09/2024  History of Present Illness  Carla [name] is a 73 y.o. female admitted 08/08/24 for sepsis after L4-5 PLIF (11/10). Chest x-ray shows minimal right basilar opacity that Moustafa be compatible with atelectasis or pneumonia. PMHx: CA, carpal tunnel release (bilateral), osteoporosis, hypothyroidism, syncope, collapse, myelodysplastic syndrome, and RSA.   Clinical Impression  Pt admitted with above diagnosis. She was recently d/c'd home after back surgery. Pt's sister-in-law reports she hasn't been out of the recliner chair or bed since returning home. Per chart review, pt was ambulating using a rollator at baseline and received assistance from her husband with car transfers and ADLs. She lives with her husband in a two story house with a ramped entrance where she can remain on the main level. Pt currently with functional limitations due to the deficits listed below (see PT Problem List). She required max-totalA x2 for bed mobility. Unable to attempt OOB mobility d/t the absence of lumbar corset brace. Sister-in-law reports she will bring it from home. Pt is currently limited by lethargy, impaired cognition, pain, back precautions, spinal brace, decreased balance, and impaired activity tolerance. Pt will benefit from acute skilled PT to increase her independence and safety with mobility to allow discharge. Recommend continued inpatient follow up therapy, <3 hours/day.      If plan is discharge home, recommend the following: Two people to help with walking and/or transfers;Two people to help with bathing/dressing/bathroom;Assistance with cooking/housework;Assist for transportation;Help with stairs or ramp for entrance;Supervision due to cognitive status   Can travel by private vehicle   No    Equipment Recommendations Wheelchair (measurements PT);Wheelchair cushion (measurements PT)   Recommendations for Other Services       Functional Status Assessment Patient has had a recent decline in their functional status and demonstrates the ability to make significant improvements in function in a reasonable and predictable amount of time.     Precautions / Restrictions Precautions Precautions: Back;Fall Precaution Booklet Issued: No Recall of Precautions/Restrictions: Impaired Required Braces or Orthoses: Spinal Brace Spinal Brace: Lumbar corset (Brace not present in room; therefore, remained bed level. Sister-in-Law plans to bring it from home.) Restrictions Weight Bearing Restrictions Per Provider Order: No      Mobility  Bed Mobility Overal bed mobility: Needs Assistance Bed Mobility: Rolling, Sidelying to Sit, Sit to Sidelying Rolling: Max assist, +2 for physical assistance, Used rails Sidelying to sit: Max assist, Total assist, +2 for physical assistance, Used rails     Sit to sidelying: Used rails, Total assist, Max assist, +2 for physical assistance General bed mobility comments: Step by step cues for sequencing. Pt sat up on L side of bed using log roll technique. Assist to bring R knee into flex and reach over with RUE to bed rail. Assist at trunk/pelvis to roll. Assist to bring BLE off EOB and elevate trunk. Assist to scoot hips fwd using bed pad. OT managed trunk and PT managed BLEs when returning to bed.    Transfers                   General transfer comment: Deferred d/t abscene of lumbar corset brace.    Ambulation/Gait                  Stairs            Wheelchair Mobility     Tilt Bed    Modified Rankin (Stroke Patients  Only)       Balance Overall balance assessment: Needs assistance, History of Falls Sitting-balance support: Feet supported, Bilateral upper extremity supported Sitting balance-Leahy Scale: Poor Sitting balance - Comments: Pt sat EOB initially with modA able to lessen to CGA.                                      Pertinent Vitals/Pain Pain Assessment Pain Assessment: Faces Faces Pain Scale: Hurts whole lot Pain Location: back, generalized Pain Descriptors / Indicators: Sore, Moaning, Grimacing, Discomfort Pain Intervention(s): Monitored during session, Limited activity within patient's tolerance, Repositioned, RN gave pain meds during session    Home Living Family/patient expects to be discharged to:: Skilled nursing facility Living Arrangements: Spouse/significant other Available Help at Discharge: Family;Available 24 hours/day Type of Home: House Home Access: Ramped entrance       Home Layout: Two level;Able to live on main level with bedroom/bathroom Home Equipment: Rolling Walker (2 wheels);Rollator (4 wheels);BSC/3in1;Shower seat - built in      Prior Function Prior Level of Function : Patient poor historian/Family not available;Independent/Modified Independent       Physical Assist : Mobility (physical);ADLs (physical)   ADLs (physical): Bathing;Dressing;Toileting;IADLs Mobility Comments: Sister in Law reports pt didn't walk any at all after discharging home from surgery. She was seated in recliner chair or laying in bed. ADLs Comments: Husband assisting with all ADLs.  Sister in [place] reports pt didn't use bathroom since being home.     Extremity/Trunk Assessment   Upper Extremity Assessment Upper Extremity Assessment: Defer to OT evaluation LUE Deficits / Details: Mildly limited shoulder ROM consistent with prior RSA    Lower Extremity Assessment Lower Extremity Assessment: Generalized weakness    Cervical / Trunk Assessment Cervical / Trunk Assessment: Back Surgery  Communication   Communication Communication: No apparent difficulties    Cognition Arousal: Lethargic Behavior During Therapy: Flat affect   PT - Cognitive impairments: Difficult to assess, Orientation Difficult to assess due to: Level of arousal Orientation impairments:  Time, Situation (Pt was unsure of today's date. She couldn't elaborate on why she was in the hospital besides being sick)                   PT - Cognition Comments: Pt drowsy throughout session. She maintained eyes open, but was unable to answer questions or fully engage in session. Pt didn't recall back precautions despite repeated education. She perseverated on help me Following commands: Impaired Following commands impaired: Follows one step commands inconsistently     Cueing Cueing Techniques: Verbal cues, Gestural cues     General Comments General comments (skin integrity, edema, etc.): VSS on 3L O2 via [place]. RN entered to check drainage on pt's back dressing.    Exercises     Assessment/Plan    PT Assessment Patient needs continued PT services  PT Problem List Decreased strength;Decreased activity tolerance;Decreased balance;Decreased mobility;Decreased knowledge of use of DME;Decreased safety awareness;Decreased knowledge of precautions;Pain;Decreased cognition       PT Treatment Interventions DME instruction;Gait training;Functional mobility training;Therapeutic activities;Therapeutic exercise;Balance training;Patient/family education    PT Goals (Current goals can be found in the Care Plan section)  Acute Rehab PT Goals Patient Stated Goal: Sister in Law says to go to rehab and get stronger before returning home PT Goal Formulation: With family Time For Goal Achievement: 08/23/24 Potential to Achieve Goals: Good    Frequency Min 2X/week  Co-evaluation PT/OT/SLP Co-Evaluation/Treatment: Yes Reason for Co-Treatment: For patient/therapist safety;To address functional/ADL transfers PT goals addressed during session: Mobility/safety with mobility;Balance OT goals addressed during session: ADL's and self-care       AM-PAC PT 6 Clicks Mobility  Outcome Measure Help needed turning from your back to your side while in a flat bed without using bedrails?:  Total Help needed moving from lying on your back to sitting on the side of a flat bed without using bedrails?: Total Help needed moving to and from a bed to a chair (including a wheelchair)?: Total Help needed standing up from a chair using your arms (e.g., wheelchair or bedside chair)?: Total Help needed to walk in hospital room?: Total Help needed climbing 3-5 steps with a railing? : Total 6 Click Score: 6    End of Session Equipment Utilized During Treatment: Oxygen Activity Tolerance: Patient limited by lethargy;Patient limited by pain;Patient limited by fatigue Patient left: in bed;with call bell/phone within reach;with bed alarm set Nurse Communication: Mobility status PT Visit Diagnosis: Muscle weakness (generalized) (M62.81);Difficulty in walking, not elsewhere classified (R26.2);Other abnormalities of gait and mobility (R26.89);Unsteadiness on feet (R26.81);Pain Pain - part of body:  (Back)    Time: 9190-9169 PT Time Calculation (min) (ACUTE ONLY): 21 min   Charges:   PT Evaluation $PT Eval Moderate Complexity: 1 Mod   PT General Charges $$ ACUTE PT VISIT: 1 Visit         Randall SAUNDERS, PT, DPT Acute Rehabilitation Services Office: [phone] Secure Chat Preferred  Delon CHRISTELLA Callander 08/09/2024, 10:21 AM

## 2024-08-09 NOTE — Progress Notes (Signed)
 Subjective: The patient is somnolent but easily arousable.  Her son and husband are on her at the bedside.  The patient is in no apparent distress.  She denies numbness.  Objective: Vital signs in last 24 hours: Temp:  [97.5 F (36.4 C)-100 F (37.8 C)] 97.5 F (36.4 C) (11/13 0800) Pulse Rate:  [82-111] 82 (11/13 1000) Resp:  [12-21] 12 (11/13 1000) BP: (118-161)/(58-80) 118/58 (11/13 1000) SpO2:  [92 %-97 %] 96 % (11/13 1000) Estimated body mass index is 27.46 kg/m as calculated from the following:   Height as of this encounter: 5' 3 (1.6 m).   Weight as of this encounter: 70.3 kg.   Intake/Output from previous day: 11/12 0701 - 11/13 0700 In: 1762.8 [I.V.:1525.2; IV Piggyback:237.6] Out: 2600 [Urine:2600] Intake/Output this shift: Total I/O In: -  Out: 650 [Urine:650]  Physical exam the patient is somnolent but easily arousable.  Her lower extremity strength is grossly normal.  Lab Results: Recent Labs    08/08/24 0840 08/09/24 0541  WBC 35.6* 45.3*  HGB 8.5* 7.9*  HCT 28.2* 25.2*  PLT 190 168   BMET Recent Labs    08/08/24 0840 08/09/24 0541  NA 135 132*  K 4.1 4.4  CL 99 98  CO2 23 23  GLUCOSE 124* 123*  BUN 13 11  CREATININE 1.09* 0.75  CALCIUM 8.4* 8.7*    Studies/Results: CT Angio Chest PE W and/or Wo Contrast Result Date: 08/09/2024 CLINICAL DATA:  Shortness of breath EXAM: CT ANGIOGRAPHY CHEST WITH CONTRAST TECHNIQUE: Multidetector CT imaging of the chest was performed using the standard protocol during bolus administration of intravenous contrast. Multiplanar CT image reconstructions and MIPs were obtained to evaluate the vascular anatomy. RADIATION DOSE REDUCTION: This exam was performed according to the departmental dose-optimization program which includes automated exposure control, adjustment of the mA and/or kV according to patient size and/or use of iterative reconstruction technique. CONTRAST:  75mL OMNIPAQUE  IOHEXOL  350 MG/ML SOLN  COMPARISON:  Chest x-ray from the previous day. FINDINGS: Cardiovascular: Thoracic aorta shows mild atherosclerotic calcifications. No aneurysmal dilatation or dissection is noted. Pulmonary artery shows a normal branching pattern. No filling defect to suggest pulmonary embolism is seen. Mild coronary calcifications are noted. Mediastinum/Nodes: Thoracic inlet is within normal limits. No hilar or mediastinal adenopathy is noted. The esophagus as visualized is within normal limits. Lungs/Pleura: Mild left basilar atelectasis is seen. More marked consolidation is noted in the right lower lobe. No sizable effusion is seen. No parenchymal nodules are noted. Upper Abdomen: Visualized upper abdomen shows no acute abnormality. Musculoskeletal: Bony structures show postsurgical change in the left shoulder. No acute rib abnormality is noted. Review of the MIP images confirms the above findings. IMPRESSION: No evidence of pulmonary embolism. Bibasilar changes worst on the right with more marked consolidation identified. Aortic Atherosclerosis (ICD10-I70.0). Electronically Signed   By: Oneil Devonshire M.D.   On: 08/09/2024 02:05   DG Hand 2 View Left Result Date: 08/08/2024 EXAM: 1 or 2 VIEW(S) XRAY OF THE LEFT HAND 08/08/2024 06:37:11 PM COMPARISON: None available. CLINICAL HISTORY: swelling. FINDINGS: BONES AND JOINTS: Scattered degenerative arthritis greatest at the [facility] joint. No acute fracture. No focal osseous lesion. No joint dislocation. SOFT TISSUES: Mild swelling about the hand and wrist. IMPRESSION: 1. No acute osseous abnormality. 2. Mild swelling about the hand and wrist. Electronically signed by: Norman Gatlin MD 08/08/2024 06:41 PM EST RP Workstation: [id number]   DG Chest Port 1 View if patient is in a treatment room. Result Date:  08/08/2024 EXAM: 1 VIEW(S) XRAY OF THE CHEST 08/08/2024 08:56:00 AM COMPARISON: 05/17/2020 status post left shoulder arthroplasty. CLINICAL HISTORY: Suspected Sepsis.  FINDINGS: LUNGS AND PLEURA: Elevated right hemidiaphragm. Minimal right basilar opacity is noted concerning for possible pneumonia or atelectasis. No pulmonary edema. No pleural effusion. No pneumothorax. HEART AND MEDIASTINUM: No acute abnormality of the cardiac and mediastinal silhouettes. BONES AND SOFT TISSUES: Status post left shoulder arthroplasty. No acute osseous abnormality. IMPRESSION: 1. Minimal right basilar opacity, compatible with atelectasis or pneumonia. Electronically signed by: Lynwood Seip MD 08/08/2024 09:12 AM EST RP Workstation: [id number]    Assessment/Plan: Postop day #3: The patient's lower extremity strength is normal.  It looks like she will need rehab.  Marked leukocytosis: The patient has been afebrile during this admission.  Her chest CT demonstrates atelectasis.  There is no evidence of pulmonary embolism.  She has been treated empirically with vancomycin  and Rocephin  for pneumonia.  I wonder if her myelo proliferative disorder is playing a role in her leukocytosis.  LOS: 1 day     [name] 08/09/2024, 11:18 AM     Patient ID: Rock SQUIBB Corzine, female   DOB: 1951-03-19, 73 y.o.   MRN: [id number]

## 2024-08-09 NOTE — Progress Notes (Signed)
 PROGRESS NOTE  Carla [name]  DOB: 10/28/1950  PCP: [name] [name], MD FMW:[id number]  DOA: 08/08/2024  LOS: 1 day  Hospital Day: 2  Subjective: Patient was seen and examined this morning.  Pleasant elderly Caucasian female.  Lying down in bed.  On low-flow oxygen.  Husband at bedside.  Family on the phone Afebrile, hemodynamically stable Labs from this morning with sodium 132, WC count 45.3, hemoglobin 7.9  Brief narrative: Carla [name] is a 73 y.o. female with PMH significant for myelodysplastic syndrome (chronic leukocytosis, chronic anemia), hypothyroidism, lumbar spine stenosis  11/10, patient underwent bilateral L4-L5 laminotomy/foraminotomies/medial facetectomy to decompress the bilateral L4 and L5 nerve roots, posterior lumbar interbody fusion by Dr. Mavis.  She was observed overnight, seen by PT next morning, was able to walk and was hence discharged to home.  Next morning at home, patient slid out of bed and fell sustaining 10 out of 10 pain and hence brought to the ED by EMS.  In the ED, patient had a low-grade temperature of 100, tachycardic to 100s, blood pressure in 140s, O2 sat 85% on room air, required 2 L oxygen nasal cannula Initial labs with WBC count 25.6, hemoglobin 8.5, platelet 190, lactic acid normal, procalcitonin slightly elevated, renal function normal Chest x-ray with minimal right basilar opacity EKG showed sinus tachycardia at 102 with QTc 421, no ST-T wave changes Blood culture was obtained Started on broad-spectrum antibiotics Admitted to [facility] Neurosurgery consulted   Assessment and plan: Generalized weakness, fall  Recent bilateral L4-L5 laminotomy/foraminotomies/medial facetectomy to decompress the bilateral L4 and L5 nerve roots, posterior lumbar interbody fusion -11/11 Dr. Mavis Presented to ED after a fall out of bed at home due to weakness on postop day 2 Seen by neurosurgery.  Raised concern of wound infection but per neurosurgery, it is too  soon for wound infection. Noted that patient has been started on Decadron  4 mg every 6 hours by neurosurgery.  Defer to neurosurgery for any further need of steroids Needs PT eval Pain regimen --- Scheduled: Neurontin  300 mg 3 times daily, Tylenol  1 g 3 times daily --- PRN: IV Dilaudid , oxycodone , Robaxin Bowel regimen -scheduled Senokot, as needed Dulcolax  SIRS Right basilar pneumonia Acute respiratory failure with hypoxia Patient had low-grade temperature, tachycardia, leukocytosis, hypoxia and elevated procalcitonin level Chest x-ray showed right basilar opacity and raise concern for pneumonia When on supplemental oxygen.  Check ambulatory oxygen requirement Blood culture sent Empirically started on IV antibiotics.  Will de-escalate antibiotics to IV Rocephin  and azithromycin  today. Recent Labs  Lab 08/08/24 0840 08/08/24 0858 08/08/24 1139 08/09/24 0541  WBC 35.6*  --   --  45.3*  LATICACIDVEN  --  1.5 1.7  --   PROCALCITON 0.30  --   --   --    Myelodysplastic syndrome Chronic leukocytosis, chronic anemia Baseline WBC count close to 20,000.  Currently running elevated likely due to steroid use for pain management  continue to trend WBC and hemoglobin. Rising WBC count Not at drop in hemoglobin to 7.9 this morning.  No active bleeding.  Platelet count normal. Continue to monitor Recent Labs  Lab 08/08/24 0840 08/09/24 0541  WBC 35.6* 45.3*  NEUTROABS 21.6*  --   HGB 8.5* 7.9*  HCT 28.2* 25.2*  MCV 98.9 96.9  PLT 190 168   CKD 3a Creatinine at baseline.  AKI ruled out   Recent Labs    12/28/23 0856 01/25/24 1002 02/23/24 1020 03/28/24 0903 04/25/24 1444 05/23/24 0914 06/20/24  1245 07/19/24 1102 08/08/24 0840 08/09/24 0541  BUN 23 21 19 18  26* 32* 41* 26* 13 11  CREATININE 1.13* 1.01* 1.02* 0.84 0.92 1.09* 1.22* 1.13* 1.09* 0.75  CO2 25 24 22  21* 21* 19* 22 21* 23 23   Hyponatremia Mildly low.  Continue to monitor Recent Labs  Lab 08/08/24 0840  08/09/24 0541  NA 135 132*    Prediabetes A1c 6.1 on October 2025  Hypothyroidism Continue Synthroid   GERD PPI  Mobility: Seen by PT  PT Orders: Active   PT Follow up Rec: Skilled Nursing-Short Term Rehab (<3 Hours/Day)08/09/2024 1000    Goals of care   Code Status: Full Code     DVT prophylaxis:  heparin  injection 5,000 Units Start: 08/08/24 2200   Antimicrobials: IV Rocephin  and azithromycin  Fluid: None Consultants: Neurosurgery Family Communication: Family at bedside  Status: Inpatient Level of care:  Telemetry   Patient is from: Home Needs to continue in-hospital care: Ongoing workup and management Anticipated d/c to: SNF ultimately    Diet:  Diet Order             Diet regular Room service appropriate? Yes; Fluid consistency: Thin  Diet effective now                   Scheduled Meds:  acetaminophen   1,000 mg Oral TID   aspirin   81 mg Oral Daily   Chlorhexidine  Gluconate Cloth  6 each Topical Q0600   Chlorhexidine  Gluconate Cloth  6 each Topical Daily   dexamethasone  (DECADRON ) injection  4 mg Intravenous Q6H   gabapentin   300 mg Oral TID   heparin   5,000 Units Subcutaneous Q8H   levothyroxine   25 mcg Oral Q0600   senna-docusate  1 tablet Oral QHS    PRN meds: bisacodyl, HYDROmorphone  (DILAUDID ) injection, methocarbamol, ondansetron  **OR** ondansetron  (ZOFRAN ) IV, oxyCODONE , polyethylene glycol   Infusions:   azithromycin  Stopped (08/08/24 1536)   cefTRIAXone  (ROCEPHIN )  IV 2 g (08/09/24 0904)    Antimicrobials: Anti-infectives (From admission, onward)    Start     Dose/Rate Route Frequency Ordered Stop   08/09/24 1400  vancomycin  (VANCOCIN ) IVPB 1000 mg/200 mL premix  Status:  Discontinued        1,000 mg 200 mL/hr over 60 Minutes Intravenous Every 24 hours 08/08/24 1427 08/09/24 1245   08/09/24 0900  cefTRIAXone  (ROCEPHIN ) 2 g in sodium chloride  0.9 % 100 mL IVPB        2 g 200 mL/hr over 30 Minutes Intravenous Every 24 hours  08/08/24 1415 08/13/24 0859   08/08/24 1430  azithromycin  (ZITHROMAX ) 500 mg in sodium chloride  0.9 % 250 mL IVPB        500 mg 250 mL/hr over 60 Minutes Intravenous Every 24 hours 08/08/24 1415 08/10/24 1414   08/08/24 1430  vancomycin  (VANCOREADY) IVPB 1500 mg/300 mL        1,500 mg 150 mL/hr over 120 Minutes Intravenous  Once 08/08/24 1415 08/08/24 1737   08/08/24 0900  cefTRIAXone  (ROCEPHIN ) 2 g in sodium chloride  0.9 % 100 mL IVPB        2 g 200 mL/hr over 30 Minutes Intravenous Once 08/08/24 0855 08/08/24 0950   08/08/24 0900  azithromycin  (ZITHROMAX ) 500 mg in sodium chloride  0.9 % 250 mL IVPB        500 mg 250 mL/hr over 60 Minutes Intravenous  Once 08/08/24 0855 08/08/24 1026       Objective: Vitals:   08/09/24 0800 08/09/24 1000  BP:  ROLLEN)  118/58  Pulse:  82  Resp:  12  Temp: (!) 97.5 F (36.4 C)   SpO2:  96%    Intake/Output Summary (Last 24 hours) at 08/09/2024 1315 Last data filed at 08/09/2024 1000 Gross per 24 hour  Intake 1762.81 ml  Output 3250 ml  Net -1487.19 ml   Filed Weights   08/08/24 0829  Weight: 70.3 kg   Weight change:  Body mass index is 27.46 kg/m.   Physical Exam: General exam: Pleasant, elderly Caucasian female.  Not in distress. Skin: No rashes, lesions or ulcers. HEENT: Atraumatic, normocephalic, no obvious bleeding Lungs: Clear to auscultation bilaterally, diminished air entry on both bases CVS: S1, S2, no murmur,   GI/Abd: Soft, nontender, nondistended, bowel sound present,   CNS: Somnolent from the acute pain medicine.  Opens eyes on command.  Able to answer questions Psychiatry: Sad affect Extremities: No pedal edema, no calf tenderness,   Data Review: I have personally reviewed the laboratory data and studies available.  F/u labs ordered Unresulted Labs (From admission, onward)     Start     Ordered   08/08/24 0840  Pathologist smear review  Once,   R        08/08/24 0840   Unscheduled  CBC with Differential/Platelet   Tomorrow morning,   R        08/09/24 1315   Unscheduled  Basic metabolic panel with GFR  Tomorrow morning,   R        08/09/24 1315            Signed, Chapman Rota, MD [facility] 08/09/2024

## 2024-08-09 NOTE — TOC Initial Note (Signed)
 Transition of Care [facility]) - Initial/Assessment Note    Patient Details  Name: Carla [name] MRN: [id number] Date of Birth: 29-Dec-1950  Transition of Care [facility]) CM/SW Contact:    Carla [name], [place] Phone Number: 08/09/2024, 11:16 AM  Clinical Narrative:  Pt admitted from home with spouse. Currently disoriented to situation. Spoke to pt's spouse Carla [name] [phone] re PT recommendation for SNF. Reviewed SNF placement process and answered questions. Pt's spouse requesting [facility]. Will f/u with offers as available. Pt will require auth for SNF and PTAR.   Julien Carla, MSW, LCSW [phone] (coverage)                  Expected Discharge Plan: Skilled Nursing Facility Barriers to Discharge: Continued Medical Work up, SNF Pending bed offer, Insurance Authorization   Patient Goals and CMS Choice     Choice offered to / list presented to : Spouse      Expected Discharge Plan and Services     Post Acute Care Choice: Skilled Nursing Facility Living arrangements for the past 2 months: Single Family Home                                      Prior Living Arrangements/Services Living arrangements for the past 2 months: Single Family Home Lives with:: Spouse Patient language and need for interpreter reviewed:: Yes        Need for Family Participation in Patient Care: Yes (Comment) Care giver support system in place?: Yes (comment)   Criminal Activity/Legal Involvement Pertinent to Current Situation/Hospitalization: No - Comment as needed  Activities of Daily Living   ADL Screening (condition at time of admission) Independently performs ADLs?: No Does the patient have a NEW difficulty with bathing/dressing/toileting/self-feeding that is expected to last >3 days?: Yes (Initiates electronic notice to provider for possible OT consult) Does the patient have a NEW difficulty with getting in/out of bed, walking, or climbing stairs that is expected to last >3 days?:  Yes (Initiates electronic notice to provider for possible PT consult) Does the patient have a NEW difficulty with communication that is expected to last >3 days?: No Is the patient deaf or have difficulty hearing?: No Does the patient have difficulty seeing, even when wearing glasses/contacts?: No Does the patient have difficulty concentrating, remembering, or making decisions?: Yes  Permission Sought/Granted Permission sought to share information with : Oceanographer granted to share information with : Yes, Verbal Permission Granted              Emotional Assessment       Orientation: : Fluctuating Orientation (Suspected and/or reported Sundowners), Oriented to  Time, Oriented to Place, Oriented to Self Alcohol / Substance Use: Not Applicable Psych Involvement: No (comment)  Admission diagnosis:  Hypoxia [R09.02] Sepsis (HCC) [A41.9] Pneumonia due to infectious organism, unspecified laterality, unspecified part of lung [J18.9] Patient Active Problem List   Diagnosis Date Noted   Sepsis (HCC) 08/08/2024   Spondylolisthesis of lumbar region 08/06/2024   Lipid screening 07/24/2024   Current use of proton pump inhibitor 07/24/2024   Immunocompromised 07/13/2023   Prediabetes 07/03/2023   Chronic kidney disease (CKD) stage G3a/A1, moderately decreased glomerular filtration rate (GFR) between 45-59 mL/min/1.73 square meter and albuminuria creatinine ratio less than 30 mg/g (HCC) 07/03/2023   Neuropathy 05/27/2023   Nontraumatic incomplete tear of right rotator cuff 05/03/2022   Rotator cuff tendinitis, right 05/03/2022  Tendinitis of upper biceps tendon of right shoulder 05/03/2022   Encounter for screening mammogram for breast cancer 04/27/2021   Vitamin D  deficiency 04/27/2021   Symptomatic anemia 05/17/2020   Goals of care, counseling/discussion 04/15/2020   MDS (myelodysplastic syndrome), 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   Hypothyroidism 12/20/2017    Normocytic anemia 12/19/2017   Estrogen deficiency 10/10/2015   Encounter for routine gynecological examination 06/22/2011   Routine general medical examination at a health care facility 06/15/2011   Allergic rhinitis 06/26/2008   PCP:  [name] [name], MD Pharmacy:   [facility] [phone] [address], [facility] - [address] [place] [address] Phone: [phone] Fax: [phone]     Social Drivers of Health (SDOH) Social History: SDOH Screenings   Food Insecurity: No Food Insecurity (08/08/2024)  Housing: Low Risk  (08/08/2024)  Transportation Needs: No Transportation Needs (08/08/2024)  Utilities: Not At Risk (08/08/2024)  Alcohol Screen: Low Risk  (05/30/2024)  Depression (PHQ2-9): Low Risk  (07/19/2024)  Financial Resource Strain: Low Risk  (05/30/2024)  Physical Activity: Inactive (05/30/2024)  Social Connections: Unknown (08/08/2024)  Stress: No Stress Concern Present (05/30/2024)  Tobacco Use: Low Risk  (08/08/2024)  Health Literacy: Adequate Health Literacy (05/30/2024)   SDOH Interventions:     Readmission Risk Interventions     No data to display

## 2024-08-10 DIAGNOSIS — A419 Sepsis, unspecified organism: Secondary | ICD-10-CM | POA: Diagnosis not present

## 2024-08-10 LAB — CBC WITH DIFFERENTIAL/PLATELET
Abs Immature Granulocytes: 3.78 K/uL — ABNORMAL HIGH (ref 0.00–0.07)
Basophils Absolute: 0.2 K/uL — ABNORMAL HIGH (ref 0.0–0.1)
Basophils Relative: 0 %
Eosinophils Absolute: 0 K/uL (ref 0.0–0.5)
Eosinophils Relative: 0 %
HCT: 24.4 % — ABNORMAL LOW (ref 36.0–46.0)
Hemoglobin: 7.7 g/dL — ABNORMAL LOW (ref 12.0–15.0)
Immature Granulocytes: 11 %
Lymphocytes Relative: 6 %
Lymphs Abs: 2 K/uL (ref 0.7–4.0)
MCH: 30 pg (ref 26.0–34.0)
MCHC: 31.6 g/dL (ref 30.0–36.0)
MCV: 94.9 fL (ref 80.0–100.0)
Monocytes Absolute: 4.1 K/uL — ABNORMAL HIGH (ref 0.1–1.0)
Monocytes Relative: 12 %
Neutro Abs: 25.3 K/uL — ABNORMAL HIGH (ref 1.7–7.7)
Neutrophils Relative %: 71 %
Platelets: 182 K/uL (ref 150–400)
RBC: 2.57 MIL/uL — ABNORMAL LOW (ref 3.87–5.11)
RDW: 28.3 % — ABNORMAL HIGH (ref 11.5–15.5)
Smear Review: NORMAL
WBC: 35.4 K/uL — ABNORMAL HIGH (ref 4.0–10.5)
nRBC: 1.2 % — ABNORMAL HIGH (ref 0.0–0.2)

## 2024-08-10 LAB — BASIC METABOLIC PANEL WITH GFR
Anion gap: 10 (ref 5–15)
BUN: 15 mg/dL (ref 8–23)
CO2: 27 mmol/L (ref 22–32)
Calcium: 9.1 mg/dL (ref 8.9–10.3)
Chloride: 100 mmol/L (ref 98–111)
Creatinine, Ser: 0.79 mg/dL (ref 0.44–1.00)
GFR, Estimated: >60 mL/min (ref >60)
Glucose, Bld: 101 mg/dL — ABNORMAL HIGH (ref 70–99)
Potassium: 3.8 mmol/L (ref 3.5–5.1)
Sodium: 137 mmol/L (ref 135–145)

## 2024-08-10 MED ORDER — COSYNTROPIN 0.25 MG IJ SOLR
0.2500 mg | Freq: Once | INTRAMUSCULAR | Status: AC
  Administered 2024-08-11: 0.25 mg via INTRAVENOUS
  Filled 2024-08-10: qty 0.25

## 2024-08-10 NOTE — Plan of Care (Signed)
  Problem: Coping: Goal: Level of anxiety will decrease Outcome: Progressing   Problem: Pain Managment: Goal: General experience of comfort will improve and/or be controlled Outcome: Progressing   Problem: Safety: Goal: Ability to remain free from injury will improve Outcome: Progressing   Problem: Skin Integrity: Goal: Risk for impaired skin integrity will decrease Outcome: Progressing

## 2024-08-10 NOTE — Progress Notes (Signed)
 PROGRESS NOTE  Carla [name]  DOB: 11-12-1950  PCP: [name] [name], MD FMW:[id number]  DOA: 08/08/2024  LOS: 2 days  Hospital Day: 3  Subjective: Patient was seen and examined this morning. Pleasant elderly Caucasian female.  Propped up in bed. Alert, awake, cheerful.  Oriented x 3.  Not on supplemental oxygen today. Family at bedside and on the phone. Afebrile, hemodynamically stable Labs from this morning with WBC count improved to 35.4, hemoglobin at 7.7, renal function normal  Brief narrative: Carla [name] is a 73 y.o. female with PMH significant for myelodysplastic syndrome (chronic leukocytosis, chronic anemia), hypothyroidism, lumbar spine stenosis  11/10, patient underwent bilateral L4-L5 laminotomy/foraminotomies/medial facetectomy to decompress the bilateral L4 and L5 nerve roots, posterior lumbar interbody fusion by Dr. Mavis.  She was observed overnight, seen by PT next morning, was able to walk and was hence discharged to home.  Next morning at home, patient slid out of bed and fell sustaining 10 out of 10 pain and hence brought to the ED by EMS.  In the ED, patient had a low-grade temperature of 100, tachycardic to 100s, blood pressure in 140s, O2 sat 85% on room air, required 2 L oxygen nasal cannula Initial labs with WBC count 25.6, hemoglobin 8.5, platelet 190, lactic acid normal, procalcitonin slightly elevated, renal function normal Chest x-ray with minimal right basilar opacity EKG showed sinus tachycardia at 102 with QTc 421, no ST-T wave changes Blood culture was obtained Started on broad-spectrum antibiotics Admitted to [facility] Neurosurgery consulted   Assessment and plan: Generalized weakness, fall  Recent bilateral L4-L5 laminotomy/foraminotomies/medial facetectomy to decompress the bilateral L4 and L5 nerve roots, posterior lumbar interbody fusion -11/11 Dr. Mavis Presented to ED after a fall out of bed at home due to weakness on postop day 2 Seen by  neurosurgery.  Ruled out surgical wound infection Was initiated on Decadron  initially which was I stopped on 11/13 after discussion with neurosurgery Needs PT eval Pain regimen --- Scheduled: Neurontin  300 mg 3 times daily, Tylenol  1 g 3 times daily --- PRN: IV Dilaudid , oxycodone , Robaxin Bowel regimen -scheduled Senokot, as needed Dulcolax  SIRS Right basilar pneumonia Acute respiratory failure with hypoxia Patient had low-grade temperature, tachycardia, leukocytosis, hypoxia and elevated procalcitonin level Chest x-ray showed right basilar opacity raising concern for pneumonia Currently on supplemental oxygen.  Check ambulatory oxygen requirement Blood culture has not shown any growth so far. Currently on IV Rocephin  and azithromycin  . WBC count improving Encourage participation with PT.  Encourage use of incentive spirometry. Recent Labs  Lab 08/08/24 0840 08/08/24 0858 08/08/24 1139 08/09/24 0541 08/10/24 0451  WBC 35.6*  --   --  45.3* 35.4*  LATICACIDVEN  --  1.5 1.7  --   --   PROCALCITON 0.30  --   --   --   --    Myelodysplastic syndrome Chronic leukocytosis, chronic anemia Baseline WBC count close to 20,000.  Currently running elevated likely due to steroid use. Steroid has been stopped.  WBC count improving.  Hemoglobin stable.  No active bleeding.  Platelet count normal. Continue to monitor Recent Labs  Lab 08/08/24 0840 08/09/24 0541 08/10/24 0451  WBC 35.6* 45.3* 35.4*  NEUTROABS 21.6*  --  25.3*  HGB 8.5* 7.9* 7.7*  HCT 28.2* 25.2* 24.4*  MCV 98.9 96.9 94.9  PLT 190 168 182   CKD 3a Creatinine at baseline.  AKI ruled out   Recent Labs    01/25/24 1002 02/23/24 1020 03/28/24 0903 04/25/24  1444 05/23/24 0914 06/20/24 1245 07/19/24 1102 08/08/24 0840 08/09/24 0541 08/10/24 0451  BUN 21 19 18  26* 32* 41* 26* 13 11 15   CREATININE 1.01* 1.02* 0.84 0.92 1.09* 1.22* 1.13* 1.09* 0.75 0.79  CO2 24 22 21* 21* 19* 22 21* 23 23 27     Hyponatremia Mildly low.  Continue to monitor Recent Labs  Lab 08/08/24 0840 08/09/24 0541 08/10/24 0451  NA 135 132* 137   Prediabetes A1c 6.1 on October 2025  Hypothyroidism Continue Synthroid   GERD PPI  Mobility: Seen by PT  PT Orders: Active   PT Follow up Rec: Skilled Nursing-Short Term Rehab (<3 Hours/Day)08/09/2024 1000    Goals of care   Code Status: Full Code     DVT prophylaxis:  heparin  injection 5,000 Units Start: 08/08/24 2200   Antimicrobials: IV Rocephin  and azithromycin  Fluid: None Consultants: Neurosurgery Family Communication: Family at bedside and on the phone.  Status: Inpatient Level of care:  Telemetry   Patient is from: Home Needs to continue in-hospital care: Ongoing workup and management Anticipated d/c to: SNF ultimately   Diet:  Diet Order             Diet regular Room service appropriate? Yes; Fluid consistency: Thin  Diet effective now                   Scheduled Meds:  acetaminophen   1,000 mg Oral TID   aspirin   81 mg Oral Daily   Chlorhexidine  Gluconate Cloth  6 each Topical Q0600   Chlorhexidine  Gluconate Cloth  6 each Topical Daily   [START ON 08/11/2024] cosyntropin  0.25 mg Intravenous Once   gabapentin   300 mg Oral TID   heparin   5,000 Units Subcutaneous Q8H   levothyroxine   25 mcg Oral Q0600   senna-docusate  1 tablet Oral QHS    PRN meds: bisacodyl, HYDROmorphone  (DILAUDID ) injection, methocarbamol, ondansetron  **OR** ondansetron  (ZOFRAN ) IV, oxyCODONE , polyethylene glycol   Infusions:   cefTRIAXone  (ROCEPHIN )  IV 2 g (08/10/24 0937)    Antimicrobials: Anti-infectives (From admission, onward)    Start     Dose/Rate Route Frequency Ordered Stop   08/09/24 1400  vancomycin  (VANCOCIN ) IVPB 1000 mg/200 mL premix  Status:  Discontinued        1,000 mg 200 mL/hr over 60 Minutes Intravenous Every 24 hours 08/08/24 1427 08/09/24 1245   08/09/24 0900  cefTRIAXone  (ROCEPHIN ) 2 g in sodium chloride   0.9 % 100 mL IVPB        2 g 200 mL/hr over 30 Minutes Intravenous Every 24 hours 08/08/24 1415 08/13/24 0859   08/08/24 1430  azithromycin  (ZITHROMAX ) 500 mg in sodium chloride  0.9 % 250 mL IVPB        500 mg 250 mL/hr over 60 Minutes Intravenous Every 24 hours 08/08/24 1415 08/09/24 1440   08/08/24 1430  vancomycin  (VANCOREADY) IVPB 1500 mg/300 mL        1,500 mg 150 mL/hr over 120 Minutes Intravenous  Once 08/08/24 1415 08/08/24 1737   08/08/24 0900  cefTRIAXone  (ROCEPHIN ) 2 g in sodium chloride  0.9 % 100 mL IVPB        2 g 200 mL/hr over 30 Minutes Intravenous Once 08/08/24 0855 08/08/24 0950   08/08/24 0900  azithromycin  (ZITHROMAX ) 500 mg in sodium chloride  0.9 % 250 mL IVPB        500 mg 250 mL/hr over 60 Minutes Intravenous  Once 08/08/24 0855 08/08/24 1026       Objective: Vitals:  08/10/24 0444 08/10/24 0945  BP: (!) 137/57 127/62  Pulse: 88 83  Resp:  16  Temp: 98.9 F (37.2 C)   SpO2: 95% 96%    Intake/Output Summary (Last 24 hours) at 08/10/2024 1415 Last data filed at 08/10/2024 1000 Gross per 24 hour  Intake 480 ml  Output 1200 ml  Net -720 ml   Filed Weights   08/08/24 0829  Weight: 70.3 kg   Weight change:  Body mass index is 27.46 kg/m.   Physical Exam: General exam: Pleasant, elderly Caucasian female.  Not in distress. Skin: No rashes, lesions or ulcers. HEENT: Atraumatic, normocephalic, no obvious bleeding Lungs: Clear to auscultation bilaterally CVS: S1, S2, no murmur,   GI/Abd: Soft, nontender, nondistended, bowel sound present,   CNS: Alert, awake, oriented x 3 Psychiatry: Mood appropriate Extremities: No pedal edema, no calf tenderness,   Data Review: I have personally reviewed the laboratory data and studies available.  F/u labs ordered Unresulted Labs (From admission, onward)     Start     Ordered   08/11/24 0500  ACTH stimulation, 3 time points (baseline, 30 min, 60 min)  (cosyntropin (CORTROSYN) test and labs)  Tomorrow  morning,   R       Comments: First sample: Draw before administering Cosyntropin Second sample: Draw 30 minutes after administering Cosyntropin Third sample: Draw 60 minutes after administering Cosyntropin.    08/10/24 1058            Signed, Chapman Rota, MD [facility] 08/10/2024

## 2024-08-10 NOTE — Progress Notes (Signed)
   Inpatient Rehabilitation Admissions Coordinator   Rehab consult received. Therapy currently recommending SNF and spouse is requesting [facility]. We will not pursue CIR at this time and will sign off.  Heron Leavell, RN, MSN Rehab Admissions Coordinator [phone] 08/10/2024 7:59 AM

## 2024-08-10 NOTE — TOC Progression Note (Addendum)
 Transition of Care [facility]) - Progression Note    Patient Details  Name: Carla [name] MRN: [id number] Date of Birth: 06/30/1951  [facility]) CM/SW Contact  Dell Briner LITTIE Moose, [place] Phone Number: 08/10/2024, 2:00 PM  Clinical Narrative:    CSW provided pt and husband with bed offers at bedside. CSW will follow up on facility choice.  3:49 PM-CSW received a call from pt husband. He stated they would prefer [facility]. CSW confirmed bed availability and will start insurance auth.   Expected Discharge Plan: Skilled Nursing Facility Barriers to Discharge: Continued Medical Work up, SNF Pending bed offer, English As A Second Language Teacher               Expected Discharge Plan and Services     Post Acute Care Choice: Skilled Nursing Facility Living arrangements for the past 2 months: Single Family Home                                       Social Drivers of Health (SDOH) Interventions SDOH Screenings   Food Insecurity: No Food Insecurity (08/08/2024)  Housing: Low Risk  (08/08/2024)  Transportation Needs: No Transportation Needs (08/08/2024)  Utilities: Not At Risk (08/08/2024)  Alcohol Screen: Low Risk  (05/30/2024)  Depression (PHQ2-9): Low Risk  (07/19/2024)  Financial Resource Strain: Low Risk  (05/30/2024)  Physical Activity: Inactive (05/30/2024)  Social Connections: Unknown (08/08/2024)  Stress: No Stress Concern Present (05/30/2024)  Tobacco Use: Low Risk  (08/08/2024)  Health Literacy: Adequate Health Literacy (05/30/2024)    Readmission Risk Interventions     No data to display

## 2024-08-10 NOTE — Progress Notes (Signed)
 Physical Therapy Treatment Patient Details Name: Carla [name] MRN: [id number] DOB: 1951-08-06 Today's Date: 08/10/2024   History of Present Illness Carla [name] is a 73 y.o. female admitted 08/08/24 for sepsis after L4-5 PLIF (11/10). Chest x-ray shows minimal right basilar opacity that Carla [name] be compatible with atelectasis or pneumonia. PMHx: CA, carpal tunnel release (bilateral), osteoporosis, hypothyroidism, syncope, collapse, myelodysplastic syndrome, and RSA.   PT Comments  Pt greeted supine in bed, pleasant and agreeable to PT session. She had improved alertness/mentation and was A,Ox4. Pt recalled 3/3 back precautions. Reviewed log roll technique and spinal brace. Assisted pt in donning lumbar corset seated EOB. She advanced OOB mobility completing sit<>stand with minA x2 and bed<>chair/BSC with min-modA x2 using RW. Pt took short slow steps with a NBOS and required assist to maneuver RW. Recommend OOB<>chair 3x/day with staff assistance using stedy. Patient will benefit from continued inpatient follow up therapy, <3 hours/day.     If plan is discharge home, recommend the following: Two people to help with walking and/or transfers;Two people to help with bathing/dressing/bathroom;Assistance with cooking/housework;Assist for transportation;Help with stairs or ramp for entrance;Supervision due to cognitive status   Can travel by private vehicle     No  Equipment Recommendations  Wheelchair (measurements PT);Wheelchair cushion (measurements PT)    Recommendations for Other Services       Precautions / Restrictions Precautions Precautions: Back;Fall Precaution Booklet Issued: No Recall of Precautions/Restrictions: Intact Precaution/Restrictions Comments: Pt recalled 3/3 back precautions. Required Braces or Orthoses: Spinal Brace Spinal Brace: Lumbar corset;Applied in sitting position Restrictions Weight Bearing Restrictions Per Provider Order: No     Mobility  Bed Mobility Overal  bed mobility: Needs Assistance Bed Mobility: Rolling, Sidelying to Sit Rolling: Min assist, +2 for physical assistance, Used rails Sidelying to sit: Min assist, +2 for physical assistance, HOB elevated, Used rails       General bed mobility comments: Pt sat up on L side of bed using log roll technique. Cues for sequencing. Assist to roll onto left side, bring BLE off EOB, elevate trunk, and scoot hips fwd.    Transfers Overall transfer level: Needs assistance Equipment used: Rolling walker (2 wheels) Transfers: Sit to/from Stand, Bed to chair/wheelchair/BSC Sit to Stand: Min assist, +2 physical assistance, +2 safety/equipment   Step pivot transfers: Min assist, Mod assist, +2 physical assistance, +2 safety/equipment       General transfer comment: Pt stood from lowest bed height and recliner chair. Cued proper hand placement using RW. Powered up with minA x2. Increased time to stabilize once upright d/t slight posterior lean. Pt transfer to recliner chair and to/from [place]. Good eccentric control.    Ambulation/Gait Ambulation/Gait assistance: Min assist, Mod assist, +2 physical assistance, +2 safety/equipment Gait Distance (Feet): 2 Feet Assistive device: Rolling walker (2 wheels) Gait Pattern/deviations: Step-to pattern, Narrow base of support Gait velocity: decreased     General Gait Details: Pt transferred with short slow steps. She maintained upright posture and good proximity to RW. Assist to manuever AD in tight space. Cues for sequencing. Pt slowly advanced each foot a minimal distance with limited foot clearence. She maintained a NBOS.   Stairs             Wheelchair Mobility     Tilt Bed    Modified Rankin (Stroke Patients Only)       Balance Overall balance assessment: Needs assistance, History of Falls Sitting-balance support: Feet supported, Bilateral upper extremity supported Sitting balance-Leahy Scale: Fair Sitting balance -  Comments: Pt sat EOB  with CGA. Assist to don lumbar corset.   Standing balance support: Bilateral upper extremity supported, During functional activity, Reliant on assistive device for balance Standing balance-Leahy Scale: Poor Standing balance comment: Pt dependent on RW                            Communication Communication Communication: No apparent difficulties  Cognition Arousal: Alert Behavior During Therapy: WFL for tasks assessed/performed   PT - Cognitive impairments: No apparent impairments                       PT - Cognition Comments: Pt A,Ox4 Following commands: Intact      Cueing Cueing Techniques: Verbal cues, Gestural cues  Exercises      General Comments General comments (skin integrity, edema, etc.): VSS on RA. Husband present and supportive during session.      Pertinent Vitals/Pain Pain Assessment Pain Assessment: Faces Faces Pain Scale: Hurts little more Pain Location: Back Pain Descriptors / Indicators: Discomfort, Aching, Sore Pain Intervention(s): Monitored during session, Repositioned    Home Living                          Prior Function            PT Goals (current goals can now be found in the care plan section) Acute Rehab PT Goals Patient Stated Goal: Get stronger and return PT Goal Formulation: With patient/family Time For Goal Achievement: 08/23/24 Potential to Achieve Goals: Good Progress towards PT goals: Progressing toward goals    Frequency    Min 2X/week      PT Plan      Co-evaluation              AM-PAC PT 6 Clicks Mobility   Outcome Measure  Help needed turning from your back to your side while in a flat bed without using bedrails?: A Lot Help needed moving from lying on your back to sitting on the side of a flat bed without using bedrails?: A Lot Help needed moving to and from a bed to a chair (including a wheelchair)?: Total Help needed standing up from a chair using your arms (e.g.,  wheelchair or bedside chair)?: Total Help needed to walk in hospital room?: Total Help needed climbing 3-5 steps with a railing? : Total 6 Click Score: 8    End of Session Equipment Utilized During Treatment: Gait belt Activity Tolerance: Patient tolerated treatment well Patient left: in chair;with call bell/phone within reach;with chair alarm set;with family/visitor present Nurse Communication: Mobility status;Need for lift equipment (stedy) PT Visit Diagnosis: Muscle weakness (generalized) (M62.81);Difficulty in walking, not elsewhere classified (R26.2);Other abnormalities of gait and mobility (R26.89);Unsteadiness on feet (R26.81);Pain Pain - part of body:  (Back)     Time: 8554-8481 PT Time Calculation (min) (ACUTE ONLY): 33 min  Charges:    $Therapeutic Activity: 23-37 mins PT General Charges $$ ACUTE PT VISIT: 1 Visit                     Randall SAUNDERS, PT, DPT Acute Rehabilitation Services Office: [phone] Secure Chat Preferred  Delon CHRISTELLA Callander 08/10/2024, 3:31 PM

## 2024-08-10 NOTE — Progress Notes (Signed)
 Subjective: The patient is alert and pleasant.  She looks and feels much better today.  Her husband and son are at the bedside.  She has not mobilized much.  Objective: Vital signs in last 24 hours: Temp:  [97.3 F (36.3 C)-98.9 F (37.2 C)] 98.9 F (37.2 C) (11/14 0444) Pulse Rate:  [81-98] 88 (11/14 0444) Resp:  [12-18] 18 (11/13 1638) BP: (117-144)/(57-77) 137/57 (11/14 0444) SpO2:  [90 %-99 %] 95 % (11/14 0444) Estimated body mass index is 27.46 kg/m as calculated from the following:   Height as of this encounter: 5' 3 (1.6 m).   Weight as of this encounter: 70.3 kg.   Intake/Output from previous day: 11/13 0701 - 11/14 0700 In: 240 [P.O.:240] Out: 1850 [Urine:1850] Intake/Output this shift: No intake/output data recorded.  Physical exam the patient is alert and oriented.  She is moving her lower extremities well.  Lab Results: Recent Labs    08/09/24 0541 08/10/24 0451  WBC 45.3* 35.4*  HGB 7.9* 7.7*  HCT 25.2* 24.4*  PLT 168 182   BMET Recent Labs    08/09/24 0541 08/10/24 0451  NA 132* 137  K 4.4 3.8  CL 98 100  CO2 23 27  GLUCOSE 123* 101*  BUN 11 15  CREATININE 0.75 0.79  CALCIUM 8.7* 9.1    Studies/Results: CT Angio Chest PE W and/or Wo Contrast Result Date: 08/09/2024 CLINICAL DATA:  Shortness of breath EXAM: CT ANGIOGRAPHY CHEST WITH CONTRAST TECHNIQUE: Multidetector CT imaging of the chest was performed using the standard protocol during bolus administration of intravenous contrast. Multiplanar CT image reconstructions and MIPs were obtained to evaluate the vascular anatomy. RADIATION DOSE REDUCTION: This exam was performed according to the departmental dose-optimization program which includes automated exposure control, adjustment of the mA and/or kV according to patient size and/or use of iterative reconstruction technique. CONTRAST:  75mL OMNIPAQUE  IOHEXOL  350 MG/ML SOLN COMPARISON:  Chest x-ray from the previous day. FINDINGS: Cardiovascular:  Thoracic aorta shows mild atherosclerotic calcifications. No aneurysmal dilatation or dissection is noted. Pulmonary artery shows a normal branching pattern. No filling defect to suggest pulmonary embolism is seen. Mild coronary calcifications are noted. Mediastinum/Nodes: Thoracic inlet is within normal limits. No hilar or mediastinal adenopathy is noted. The esophagus as visualized is within normal limits. Lungs/Pleura: Mild left basilar atelectasis is seen. More marked consolidation is noted in the right lower lobe. No sizable effusion is seen. No parenchymal nodules are noted. Upper Abdomen: Visualized upper abdomen shows no acute abnormality. Musculoskeletal: Bony structures show postsurgical change in the left shoulder. No acute rib abnormality is noted. Review of the MIP images confirms the above findings. IMPRESSION: No evidence of pulmonary embolism. Bibasilar changes worst on the right with more marked consolidation identified. Aortic Atherosclerosis (ICD10-I70.0). Electronically Signed   By: Oneil Devonshire M.D.   On: 08/09/2024 02:05   DG Hand 2 View Left Result Date: 08/08/2024 EXAM: 1 or 2 VIEW(S) XRAY OF THE LEFT HAND 08/08/2024 06:37:11 PM COMPARISON: None available. CLINICAL HISTORY: swelling. FINDINGS: BONES AND JOINTS: Scattered degenerative arthritis greatest at the [facility] joint. No acute fracture. No focal osseous lesion. No joint dislocation. SOFT TISSUES: Mild swelling about the hand and wrist. IMPRESSION: 1. No acute osseous abnormality. 2. Mild swelling about the hand and wrist. Electronically signed by: Norman Gatlin MD 08/08/2024 06:41 PM EST RP Workstation: [id number]   DG Chest Port 1 View if patient is in a treatment room. Result Date: 08/08/2024 EXAM: 1 VIEW(S) XRAY OF THE CHEST  08/08/2024 08:56:00 AM COMPARISON: 05/17/2020 status post left shoulder arthroplasty. CLINICAL HISTORY: Suspected Sepsis. FINDINGS: LUNGS AND PLEURA: Elevated right hemidiaphragm. Minimal right basilar  opacity is noted concerning for possible pneumonia or atelectasis. No pulmonary edema. No pleural effusion. No pneumothorax. HEART AND MEDIASTINUM: No acute abnormality of the cardiac and mediastinal silhouettes. BONES AND SOFT TISSUES: Status post left shoulder arthroplasty. No acute osseous abnormality. IMPRESSION: 1. Minimal right basilar opacity, compatible with atelectasis or pneumonia. Electronically signed by: Lynwood Seip MD 08/08/2024 09:12 AM EST RP Workstation: [id number]    Assessment/Plan: Postop day #4: The patient is doing better today.  We will continue to mobilize her with PT.  It looks like she might need rehab.  Leukocytosis: Her white count has dropped a bit today.  She is afebrile on empiric antibiotics.  LOS: 2 days     Carla [name] 08/10/2024, 7:47 AM     Patient ID: Carla [name], female   DOB: [birth year], 73 y.o.   MRN: [id number]

## 2024-08-11 DIAGNOSIS — A419 Sepsis, unspecified organism: Secondary | ICD-10-CM | POA: Diagnosis not present

## 2024-08-11 LAB — CBC
HCT: 24.4 % — ABNORMAL LOW (ref 36.0–46.0)
Hemoglobin: 7.5 g/dL — ABNORMAL LOW (ref 12.0–15.0)
MCH: 29.9 pg (ref 26.0–34.0)
MCHC: 30.7 g/dL (ref 30.0–36.0)
MCV: 97.2 fL (ref 80.0–100.0)
Platelets: 196 K/uL (ref 150–400)
RBC: 2.51 MIL/uL — ABNORMAL LOW (ref 3.87–5.11)
RDW: 28.9 % — ABNORMAL HIGH (ref 11.5–15.5)
WBC: 30.3 K/uL — ABNORMAL HIGH (ref 4.0–10.5)
nRBC: 1.5 % — ABNORMAL HIGH (ref 0.0–0.2)

## 2024-08-11 LAB — BASIC METABOLIC PANEL WITH GFR
Anion gap: 11 (ref 5–15)
BUN: 14 mg/dL (ref 8–23)
CO2: 23 mmol/L (ref 22–32)
Calcium: 8 mg/dL — ABNORMAL LOW (ref 8.9–10.3)
Chloride: 101 mmol/L (ref 98–111)
Creatinine, Ser: 0.83 mg/dL (ref 0.44–1.00)
GFR, Estimated: >60 mL/min (ref >60)
Glucose, Bld: 146 mg/dL — ABNORMAL HIGH (ref 70–99)
Potassium: 3.5 mmol/L (ref 3.5–5.1)
Sodium: 135 mmol/L (ref 135–145)

## 2024-08-11 LAB — TYPE AND SCREEN
ABO/RH(D): B POS
ABO/RH(D): B POS
Antibody Screen: NEGATIVE
Antibody Screen: NEGATIVE

## 2024-08-11 LAB — ACTH STIMULATION, 3 TIME POINTS
Cortisol, 30 Min: 13.4 ug/dL
Cortisol, 60 Min: 15.6 ug/dL
Cortisol, Base: 9.1 ug/dL

## 2024-08-11 MED ORDER — OYSTER SHELL CALCIUM/D3 500-5 MG-MCG PO TABS
1.0000 | ORAL_TABLET | Freq: Every day | ORAL | Status: DC
  Administered 2024-08-12 – 2024-08-14 (×3): 1 via ORAL
  Filled 2024-08-11 (×2): qty 1

## 2024-08-11 MED ORDER — SENNOSIDES-DOCUSATE SODIUM 8.6-50 MG PO TABS
1.0000 | ORAL_TABLET | Freq: Two times a day (BID) | ORAL | Status: DC
  Filled 2024-08-11 (×2): qty 1

## 2024-08-11 MED ORDER — CALCIUM CARBONATE ANTACID 500 MG PO CHEW
1.0000 | CHEWABLE_TABLET | Freq: Three times a day (TID) | ORAL | Status: DC
  Administered 2024-08-11 – 2024-08-13 (×4): 200 mg via ORAL
  Filled 2024-08-11 (×4): qty 1

## 2024-08-11 MED ORDER — HYDROCORTISONE 10 MG PO TABS
10.0000 mg | ORAL_TABLET | Freq: Two times a day (BID) | ORAL | Status: DC
  Administered 2024-08-11 – 2024-08-14 (×7): 10 mg via ORAL
  Filled 2024-08-11 (×8): qty 1

## 2024-08-11 NOTE — Progress Notes (Signed)
 Mobility Specialist Progress Note:    08/11/24 1110  Mobility  Activity Pivoted/transferred to/from BSC  Level of Assistance Moderate assist, patient does 50-74% (+2)  Assistive Device Front wheel walker  Distance Ambulated (ft) 3 ft  Activity Response Tolerated well  Mobility Referral Yes  Mobility visit 1 Mobility  Mobility Specialist Start Time (ACUTE ONLY) 1057  Mobility Specialist Stop Time (ACUTE ONLY) 1110  Mobility Specialist Time Calculation (min) (ACUTE ONLY) 13 min   Received pt in bed and agreeable to mobility. Pt required ModA +2 to EOB and MinA +2 STS. Pt requested to use the BR prior to chair transfer. Pt left on [facility] w/ call light within reach to call out once finished. All needs met.  Lavanda Pollack Mobility Specialist  Please contact via [employer] or  Rehab Office [phone]

## 2024-08-11 NOTE — Progress Notes (Signed)
   08/11/24 1700  Urine Measurement/Characteristics  Bladder Scan Volume (mL) 120 mL   PT still DTV, BS Volume remains low

## 2024-08-11 NOTE — Progress Notes (Signed)
 Patient ID: Carla [name], Carla [name]   DOB: 09/16/1951, 73 y.o.   MRN: [id number] She is stable.  She is awake and alert and has no complaints.  Awaiting placement.  Please call for any questions or concerns

## 2024-08-11 NOTE — Progress Notes (Signed)
 Mobility Specialist Progress Note:    08/11/24 1140  Mobility  Activity Pivoted/transferred from bed to chair  Level of Assistance Minimal assist, patient does 75% or more (+2)  Assistive Device Front wheel walker  Distance Ambulated (ft) 3 ft  Activity Response Tolerated well  Mobility Referral Yes  Mobility visit 1 Mobility  Mobility Specialist Start Time (ACUTE ONLY) 1125  Mobility Specialist Stop Time (ACUTE ONLY) 1140  Mobility Specialist Time Calculation (min) (ACUTE ONLY) 15 min   Received pt requesting assistance to chair. Pt had BM, MS assisted pt with pericare. Pt required MinA +2 STS. Pt took steps to chair. Personal belongings and call light within reach. All needs met. RN aware.  Lavanda Pollack Mobility Specialist  Please contact via [employer] or  Rehab Office [phone]

## 2024-08-11 NOTE — Progress Notes (Signed)
 PROGRESS NOTE  Carla [name]  DOB: [birth year]  PCP: [name] [name], MD FMW:[id number]  DOA: 08/08/2024  LOS: 3 days  Hospital Day: 4  Subjective: Patient was seen and examined this morning. Lying on bed.  Not in distress.  Feels better.  But still slow. Husband at bedside.  Daughter on the phone Afebrile, hemodynamically stable Labs this morning showed WC count 30,000, hemoglobin 7.5  Brief narrative: Carla [name] is a 73 y.o. female with PMH significant for myelodysplastic syndrome (chronic leukocytosis, chronic anemia), hypothyroidism, lumbar spine stenosis  11/10, patient underwent bilateral L4-L5 laminotomy/foraminotomies/medial facetectomy to decompress the bilateral L4 and L5 nerve roots, posterior lumbar interbody fusion by Dr. Mavis.  She was observed overnight, seen by PT next morning, was able to walk and was hence discharged to home.  Next morning at home, patient slid out of bed and fell sustaining 10 out of 10 pain and hence brought to the ED by EMS.  In the ED, patient had a low-grade temperature of 100, tachycardic to 100s, blood pressure in 140s, O2 sat 85% on room air, required 2 L oxygen nasal cannula Initial labs with WBC count 25.6, hemoglobin 8.5, platelet 190, lactic acid normal, procalcitonin slightly elevated, renal function normal Chest x-ray with minimal right basilar opacity EKG showed sinus tachycardia at 102 with QTc 421, no ST-T wave changes Blood culture was obtained Started on broad-spectrum antibiotics Admitted to [facility] Neurosurgery consulted   Assessment and plan: Generalized weakness, fall  Recent bilateral L4-L5 laminotomy/foraminotomies/medial facetectomy to decompress the bilateral L4 and L5 nerve roots, posterior lumbar interbody fusion -11/11 Dr. Mavis Presented to ED after a fall out of bed at home due to weakness on postop day 2 Seen by neurosurgery.  Ruled out surgical wound infection Was initiated on Decadron  initially which was I  stopped on 11/13 after discussion with neurosurgery Seen by PT.  SNF recommended Pain regimen --- Scheduled: Neurontin  300 mg 3 times daily, Tylenol  1 g 3 times daily --- PRN: IV Dilaudid , oxycodone , Robaxin Has not had a BM in few days.  Currently on bowel regimen with-scheduled Senokot, as needed MiraLAX.  Increase Senokot to twice daily  SIRS Right basilar pneumonia Acute respiratory failure with hypoxia Patient had low-grade temperature, tachycardia, leukocytosis, hypoxia and elevated procalcitonin level Chest x-ray showed right basilar opacity raising concern for pneumonia Currently on supplemental oxygen.  Check ambulatory oxygen requirement Blood culture has not shown any growth so far. Currently on IV Rocephin  and azithromycin  . WBC count improving Encourage participation with PT.  Encourage use of incentive spirometry. Recent Labs  Lab 08/08/24 0840 08/08/24 0858 08/08/24 1139 08/09/24 0541 08/10/24 0451 08/11/24 0934  WBC 35.6*  --   --  45.3* 35.4* 30.3*  LATICACIDVEN  --  1.5 1.7  --   --   --   PROCALCITON 0.30  --   --   --   --   --    Myelodysplastic syndrome Chronic leukocytosis, chronic anemia Baseline WBC count close to 20,000.  Currently running elevated likely due to steroid use. Steroid has been stopped.  WBC count improving.  Hemoglobin mostly stable above 7. No active bleeding.  Platelet count normal. Continue to monitor. Recent Labs  Lab 08/08/24 0840 08/09/24 0541 08/10/24 0451 08/11/24 0934  WBC 35.6* 45.3* 35.4* 30.3*  NEUTROABS 21.6*  --  25.3*  --   HGB 8.5* 7.9* 7.7* 7.5*  HCT 28.2* 25.2* 24.4* 24.4*  MCV 98.9 96.9 94.9 97.2  PLT 190 168  182 196   Adrenal insufficiency New diagnosis A.m. cortisol level was low at 2.3 yesterday.  Cosyntropin stimulation test was done today.  Peak cortisol level at 60 minutes mark was 15.6.  Per 'OpenEvidence' guideline, peak cortisol level at less than 18 establishes a diagnosis of adrenal insufficiency.   I have started her on hydrocortisone 10 mg twice daily along with calcium and vitamin D  supplement.  CKD 3a Creatinine at baseline.  AKI ruled out   Recent Labs    02/23/24 1020 03/28/24 0903 04/25/24 1444 05/23/24 0914 06/20/24 1245 07/19/24 1102 08/08/24 0840 08/09/24 0541 08/10/24 0451 08/11/24 0934  BUN 19 18 26* 32* 41* 26* 13 11 15 14   CREATININE 1.02* 0.84 0.92 1.09* 1.22* 1.13* 1.09* 0.75 0.79 0.83  CO2 22 21* 21* 19* 22 21* 23 23 27 23    Prediabetes A1c 6.1 on October 2025  Hypothyroidism Continue Synthroid   GERD PPI  Mobility: Seen by PT  PT Orders: Active   PT Follow up Rec: Skilled Nursing-Short Term Rehab (<3 Hours/Day)08/10/2024 1500    Goals of care   Code Status: Full Code     DVT prophylaxis:  heparin  injection 5,000 Units Start: 08/08/24 2200   Antimicrobials: IV Rocephin  and azithromycin  Fluid: None Consultants: Neurosurgery Family Communication: Family at bedside and on the phone.  Status: Inpatient Level of care:  Telemetry   Patient is from: Home Needs to continue in-hospital care: It seems Foley was placed in the ED.  DC Foley today for voiding trial.  Otherwise medically stable for discharge. Anticipated d/c to: SNF in process   Diet:  Diet Order             Diet regular Room service appropriate? Yes; Fluid consistency: Thin  Diet effective now                   Scheduled Meds:  acetaminophen   1,000 mg Oral TID   aspirin   81 mg Oral Daily   [START ON 08/12/2024] calcium-vitamin D   1 tablet Oral Q breakfast   Chlorhexidine  Gluconate Cloth  6 each Topical Q0600   Chlorhexidine  Gluconate Cloth  6 each Topical Daily   gabapentin   300 mg Oral TID   heparin   5,000 Units Subcutaneous Q8H   hydrocortisone  10 mg Oral BID   levothyroxine   25 mcg Oral Q0600   senna-docusate  1 tablet Oral BID    PRN meds: bisacodyl, HYDROmorphone  (DILAUDID ) injection, methocarbamol, ondansetron  **OR** ondansetron  (ZOFRAN ) IV,  oxyCODONE , polyethylene glycol   Infusions:   cefTRIAXone  (ROCEPHIN )  IV 2 g (08/11/24 0843)    Antimicrobials: Anti-infectives (From admission, onward)    Start     Dose/Rate Route Frequency Ordered Stop   08/09/24 1400  vancomycin  (VANCOCIN ) IVPB 1000 mg/200 mL premix  Status:  Discontinued        1,000 mg 200 mL/hr over 60 Minutes Intravenous Every 24 hours 08/08/24 1427 08/09/24 1245   08/09/24 0900  cefTRIAXone  (ROCEPHIN ) 2 g in sodium chloride  0.9 % 100 mL IVPB        2 g 200 mL/hr over 30 Minutes Intravenous Every 24 hours 08/08/24 1415 08/13/24 0859   08/08/24 1430  azithromycin  (ZITHROMAX ) 500 mg in sodium chloride  0.9 % 250 mL IVPB        500 mg 250 mL/hr over 60 Minutes Intravenous Every 24 hours 08/08/24 1415 08/09/24 1440   08/08/24 1430  vancomycin  (VANCOREADY) IVPB 1500 mg/300 mL        1,500 mg 150  mL/hr over 120 Minutes Intravenous  Once 08/08/24 1415 08/08/24 1737   08/08/24 0900  cefTRIAXone  (ROCEPHIN ) 2 g in sodium chloride  0.9 % 100 mL IVPB        2 g 200 mL/hr over 30 Minutes Intravenous Once 08/08/24 0855 08/08/24 0950   08/08/24 0900  azithromycin  (ZITHROMAX ) 500 mg in sodium chloride  0.9 % 250 mL IVPB        500 mg 250 mL/hr over 60 Minutes Intravenous  Once 08/08/24 0855 08/08/24 1026       Objective: Vitals:   08/10/24 1926 08/11/24 0516  BP: 109/75 (!) 147/60  Pulse: 88 74  Resp:    Temp: 98.7 F (37.1 C) 99.1 F (37.3 C)  SpO2: 96% 97%    Intake/Output Summary (Last 24 hours) at 08/11/2024 1109 Last data filed at 08/11/2024 0500 Gross per 24 hour  Intake --  Output 1200 ml  Net -1200 ml   Filed Weights   08/08/24 0829  Weight: 70.3 kg   Weight change:  Body mass index is 27.46 kg/m.   Physical Exam: General exam: Pleasant, elderly Caucasian female.  Not in distress.  Not in pain but looks weak. Skin: No rashes, lesions or ulcers. HEENT: Atraumatic, normocephalic, no obvious bleeding Lungs: Clear to auscultation  bilaterally CVS: S1, S2, no murmur,   GI/Abd: Soft, nontender, nondistended, bowel sound present,   CNS: Alert, awake, oriented x 3 Psychiatry: Mood appropriate Extremities: No pedal edema, no calf tenderness,   Data Review: I have personally reviewed the laboratory data and studies available.  F/u labs ordered Unresulted Labs (From admission, onward)    None       Signed, Chapman Rota, MD [facility] 08/11/2024

## 2024-08-11 NOTE — Plan of Care (Signed)
  Problem: Fluid Volume: Goal: Hemodynamic stability will improve Outcome: Progressing   Problem: Education: Goal: Knowledge of General Education information will improve Description: Including pain rating scale, medication(s)/side effects and non-pharmacologic comfort measures Outcome: Progressing   Problem: Activity: Goal: Risk for activity intolerance will decrease Outcome: Progressing   Problem: Nutrition: Goal: Adequate nutrition will be maintained Outcome: Progressing   Problem: Coping: Goal: Level of anxiety will decrease Outcome: Progressing   Problem: Pain Managment: Goal: General experience of comfort will improve and/or be controlled Outcome: Progressing

## 2024-08-11 NOTE — Progress Notes (Signed)
Foley Cath removed per order

## 2024-08-11 NOTE — TOC Progression Note (Addendum)
 Transition of Care [facility]) - Progression Note    Patient Details  Name: Carla [name] MRN: [id number] Date of Birth: 1950-10-16  [facility] CM/SW Contact  Isaiah Public, LCSWA Phone Number: 08/11/2024, 12:22 PM  Clinical Narrative:     CSW LVM for Jori with [facility]. CSW awaiting call back to confirm insurance authorization was started for patient. Patient has SN[facility] bed [facility]. CSW will continue to follow.  Expected Discharge Plan: Skilled Nursing Facility Barriers to Discharge: Continued Medical Work up, SNF Pending bed offer, English As A Second Language Teacher               Expected Discharge Plan and Services     Post Acute Care Choice: Skilled Nursing Facility Living arrangements for the past 2 months: Single Family Home                                       Social Drivers of Health (SDOH) Interventions SDOH Screenings   Food Insecurity: No Food Insecurity (08/08/2024)  Housing: Low Risk  (08/08/2024)  Transportation Needs: No Transportation Needs (08/08/2024)  Utilities: Not At Risk (08/08/2024)  Alcohol Screen: Low Risk  (05/30/2024)  Depression (PHQ2-9): Low Risk  (07/19/2024)  Financial Resource Strain: Low Risk  (05/30/2024)  Physical Activity: Inactive (05/30/2024)  Social Connections: Unknown (08/08/2024)  Stress: No Stress Concern Present (05/30/2024)  Tobacco Use: Low Risk  (08/08/2024)  Health Literacy: Adequate Health Literacy (05/30/2024)    Readmission Risk Interventions     No data to display

## 2024-08-12 ENCOUNTER — Telehealth: Payer: Self-pay | Admitting: Internal Medicine

## 2024-08-12 DIAGNOSIS — A419 Sepsis, unspecified organism: Secondary | ICD-10-CM | POA: Diagnosis not present

## 2024-08-12 MED ORDER — SENNOSIDES-DOCUSATE SODIUM 8.6-50 MG PO TABS
1.0000 | ORAL_TABLET | Freq: Every day | ORAL | Status: DC
  Administered 2024-08-13: 1 via ORAL
  Filled 2024-08-12: qty 1

## 2024-08-12 MED ORDER — CHLORHEXIDINE GLUCONATE CLOTH 2 % EX PADS: 6.0000 | MEDICATED_PAD | Freq: Every day | CUTANEOUS | Status: DC

## 2024-08-12 NOTE — Telephone Encounter (Signed)
 I discussed with patient's hospitalist physician-regarding patient's care.  And also reviewed patient's son, corey-answered all question to his satisfaction.  GB

## 2024-08-12 NOTE — Plan of Care (Signed)
  Problem: Fluid Volume: Goal: Hemodynamic stability will improve 08/12/2024 0701 by Georjean Delene SQUIBB, RN Outcome: Progressing 08/12/2024 0700 by Georjean Delene SQUIBB, RN Outcome: Progressing

## 2024-08-12 NOTE — Plan of Care (Signed)
   Problem: Fluid Volume: Goal: Hemodynamic stability will improve Outcome: Progressing

## 2024-08-12 NOTE — Progress Notes (Signed)
 Patient ID: Carla [name], female   DOB: Shore 17, 1952, 73 y.o.   MRN: [id number] Stable.  She has no complaints.  She looks good.  She is out of bed to a chair.  Sinew current management.  Awaiting placement.

## 2024-08-12 NOTE — Progress Notes (Signed)
 Mobility Specialist Progress Note:    08/12/24 1414  Mobility  Activity Pivoted/transferred to/from [facility];Pivoted/transferred from bed to chair  Level of Assistance Moderate assist, patient does 50-74%  Assistive Device Front wheel walker  Distance Ambulated (ft) 8 ft  Activity Response Tolerated well  Mobility Referral Yes  Mobility visit 1 Mobility  Mobility Specialist Start Time (ACUTE ONLY) 1355  Mobility Specialist Stop Time (ACUTE ONLY) 1414  Mobility Specialist Time Calculation (min) (ACUTE ONLY) 19 min   Received pt in bed and eager for mobility. Pt required ModA +2 to EOB and MinA +2 STS. Pt requested to use the BR prior to chair transfer. Voided successfully; MS assisted w/ pericare. Pt left in chair with personal belongings and call light within reach. All needs met.  Lavanda Pollack Mobility Specialist  Please contact via [employer] or  Rehab Office [phone]

## 2024-08-12 NOTE — Progress Notes (Signed)
 PROGRESS NOTE  Carla [name]  DOB: 1951/05/01  PCP: [name] [name], MD FMW:[id number]  DOA: 08/08/2024  LOS: 4 days  Hospital Day: 5  Subjective: Patient was seen and examined this morning. Sitting on a recliner..  Not in distress.  Husband at bedside.  Had multiple bowel movements in last 24 hours. Afebrile, hemodynamically stable, breathing on room air. No labs this morning, hemoglobin 7.5 Foley catheter removed yesterday.  Able to void.  Brief narrative: Carla [name] is a 73 y.o. female with PMH significant for myelodysplastic syndrome (chronic leukocytosis, chronic anemia), hypothyroidism, lumbar spine stenosis  11/10, patient underwent bilateral L4-L5 laminotomy/foraminotomies/medial facetectomy to decompress the bilateral L4 and L5 nerve roots, posterior lumbar interbody fusion by Dr. Mavis.  She was observed overnight, seen by PT next morning, was able to walk and was hence discharged to home.  Next morning at home, patient slid out of bed and fell sustaining 10 out of 10 pain and hence brought to the ED by EMS.  In the ED, patient had a low-grade temperature of 100, tachycardic to 100s, blood pressure in 140s, O2 sat 85% on room air, required 2 L oxygen nasal cannula Initial labs with WBC count 25.6, hemoglobin 8.5, platelet 190, lactic acid normal, procalcitonin slightly elevated, renal function normal Chest x-ray with minimal right basilar opacity EKG showed sinus tachycardia at 102 with QTc 421, no ST-T wave changes Blood culture was obtained Started on broad-spectrum antibiotics Admitted to [facility]  Assessment and plan: Generalized weakness, fall  Recent bilateral L4-L5 laminotomy/foraminotomies/medial facetectomy to decompress the bilateral L4 and L5 nerve roots, posterior lumbar interbody fusion -11/11 Dr. Mavis Presented to ED after a fall out of bed at home due to weakness on postop day 2 Seen by neurosurgery.  Ruled out surgical wound infection Was initiated on  Decadron  initially which was I stopped on 11/13 after discussion with neurosurgery Seen by PT.  SNF recommended Pain regimen --- Scheduled: Neurontin  300 mg 3 times daily, Tylenol  1 g 3 times daily --- PRN: IV Dilaudid , oxycodone , Robaxin Was given MiraLAX twice and Senokot twice daily yesterday for constipation.  Had multiple bowel movements in last 24 hours.  Stop Senokot today.  SIRS Right basilar pneumonia Acute respiratory failure with hypoxia Patient had low-grade temperature, tachycardia, leukocytosis, hypoxia and elevated procalcitonin level Chest x-ray showed right basilar opacity raising concern for pneumonia Currently on supplemental oxygen.  Check ambulatory oxygen requirement Blood culture has not shown any growth so far. Currently on 5-day course of IV Rocephin  and azithromycin  . WBC count improving Encourage participation with PT.  Encourage use of incentive spirometry. Recent Labs  Lab 08/08/24 0840 08/08/24 0858 08/08/24 1139 08/09/24 0541 08/10/24 0451 08/11/24 0934  WBC 35.6*  --   --  45.3* 35.4* 30.3*  LATICACIDVEN  --  1.5 1.7  --   --   --   PROCALCITON 0.30  --   --   --   --   --    Myelodysplastic syndrome Chronic leukocytosis, chronic anemia Baseline WBC count close to 20,000.   WBC count peaked at 45,000 due to infection and steroid use.  Creatinine down trended.   Hemoglobin mostly stable above 7. No active bleeding.  Platelet count normal. Continue to monitor. Recent Labs  Lab 08/08/24 0840 08/09/24 0541 08/10/24 0451 08/11/24 0934  WBC 35.6* 45.3* 35.4* 30.3*  NEUTROABS 21.6*  --  25.3*  --   HGB 8.5* 7.9* 7.7* 7.5*  HCT 28.2* 25.2* 24.4* 24.4*  MCV 98.9 96.9 94.9 97.2  PLT 190 168 182 196   New diagnosis of adrenal insufficiency 11/14, a.m. cortisol level was low at 2.3.   11/15 cosyntropin stimulation test was done .  Peak cortisol level at 60 minutes mark was 15.6.  Per 'OpenEvidence' guideline, peak cortisol level at less than 18  establishes a diagnosis of adrenal insufficiency.   Patient has been started on hydrocortisone 10 mg twice daily along with calcium and vitamin D  supplement.  CKD 3a Creatinine at baseline.  AKI ruled out   Recent Labs    02/23/24 1020 03/28/24 0903 04/25/24 1444 05/23/24 0914 06/20/24 1245 07/19/24 1102 08/08/24 0840 08/09/24 0541 08/10/24 0451 08/11/24 0934  BUN 19 18 26* 32* 41* 26* 13 11 15 14   CREATININE 1.02* 0.84 0.92 1.09* 1.22* 1.13* 1.09* 0.75 0.79 0.83  CO2 22 21* 21* 19* 22 21* 23 23 27 23    Prediabetes A1c 6.1 on October 2025  Hypothyroidism Continue Synthroid   GERD PPI  Mobility: Seen by PT  PT Orders: Active   PT Follow up Rec: Skilled Nursing-Short Term Rehab (<3 Hours/Day)08/10/2024 1500    Goals of care   Code Status: Full Code     DVT prophylaxis:  heparin  injection 5,000 Units Start: 08/08/24 2200   Antimicrobials: IV Rocephin  and azithromycin  Fluid: None Consultants: Neurosurgery Family Communication: Family at bedside and on the phone.  Status: Inpatient Level of care:  Telemetry   Patient is from: Home Needs to continue in-hospital care: medically stable for discharge. Anticipated d/c to: SNF in process   Diet:  Diet Order             Diet regular Room service appropriate? Yes; Fluid consistency: Thin  Diet effective now                   Scheduled Meds:  acetaminophen   1,000 mg Oral TID   aspirin   81 mg Oral Daily   calcium carbonate  1 tablet Oral TID WC   calcium-vitamin D   1 tablet Oral Q breakfast   Chlorhexidine  Gluconate Cloth  6 each Topical Daily   gabapentin   300 mg Oral TID   heparin   5,000 Units Subcutaneous Q8H   hydrocortisone  10 mg Oral BID   levothyroxine   25 mcg Oral Q0600   [START ON 08/13/2024] senna-docusate  1 tablet Oral QHS    PRN meds: bisacodyl, HYDROmorphone  (DILAUDID ) injection, methocarbamol, ondansetron  **OR** ondansetron  (ZOFRAN ) IV, oxyCODONE , polyethylene glycol    Infusions:   cefTRIAXone  (ROCEPHIN )  IV 2 g (08/11/24 0843)    Antimicrobials: Anti-infectives (From admission, onward)    Start     Dose/Rate Route Frequency Ordered Stop   08/09/24 1400  vancomycin  (VANCOCIN ) IVPB 1000 mg/200 mL premix  Status:  Discontinued        1,000 mg 200 mL/hr over 60 Minutes Intravenous Every 24 hours 08/08/24 1427 08/09/24 1245   08/09/24 0900  cefTRIAXone  (ROCEPHIN ) 2 g in sodium chloride  0.9 % 100 mL IVPB        2 g 200 mL/hr over 30 Minutes Intravenous Every 24 hours 08/08/24 1415 08/13/24 0859   08/08/24 1430  azithromycin  (ZITHROMAX ) 500 mg in sodium chloride  0.9 % 250 mL IVPB        500 mg 250 mL/hr over 60 Minutes Intravenous Every 24 hours 08/08/24 1415 08/09/24 1440   08/08/24 1430  vancomycin  (VANCOREADY) IVPB 1500 mg/300 mL        1,500 mg 150 mL/hr over 120 Minutes  Intravenous  Once 08/08/24 1415 08/08/24 1737   08/08/24 0900  cefTRIAXone  (ROCEPHIN ) 2 g in sodium chloride  0.9 % 100 mL IVPB        2 g 200 mL/hr over 30 Minutes Intravenous Once 08/08/24 0855 08/08/24 0950   08/08/24 0900  azithromycin  (ZITHROMAX ) 500 mg in sodium chloride  0.9 % 250 mL IVPB        500 mg 250 mL/hr over 60 Minutes Intravenous  Once 08/08/24 0855 08/08/24 1026       Objective: Vitals:   08/12/24 0527 08/12/24 0742  BP: 122/66 134/67  Pulse: 71 77  Resp: 18 16  Temp: 98.5 F (36.9 C) 98.6 F (37 C)  SpO2: 94% 94%    Intake/Output Summary (Last 24 hours) at 08/12/2024 1148 Last data filed at 08/11/2024 2000 Gross per 24 hour  Intake 220 ml  Output 100 ml  Net 120 ml   Filed Weights   08/08/24 0829  Weight: 70.3 kg   Weight change:  Body mass index is 27.46 kg/m.   Physical Exam: General exam: Pleasant, elderly Caucasian female.  Not in distress.  Looks better today. Skin: No rashes, lesions or ulcers. HEENT: Atraumatic, normocephalic, no obvious bleeding Lungs: Clear to auscultation bilaterally CVS: S1, S2, no murmur,   GI/Abd: Soft,  nontender, nondistended, bowel sound present,   CNS: Alert, awake, oriented x 3 Psychiatry: Mood appropriate Extremities: No pedal edema, no calf tenderness,   Data Review: I have personally reviewed the laboratory data and studies available.  F/u labs ordered Unresulted Labs (From admission, onward)    None       Signed, Chapman Rota, MD [facility] 08/12/2024

## 2024-08-12 NOTE — Plan of Care (Signed)
  Problem: Fluid Volume: Goal: Hemodynamic stability will improve 08/12/2024 0701 by Georjean Delene SQUIBB, RN Outcome: Progressing 08/12/2024 0700 by Georjean Delene SQUIBB, RN Outcome: Progressing   Problem: Education: Goal: Knowledge of General Education information will improve Description: Including pain rating scale, medication(s)/side effects and non-pharmacologic comfort measures Outcome: Progressing

## 2024-08-12 NOTE — Plan of Care (Signed)
  Problem: Fluid Volume: Goal: Hemodynamic stability will improve 08/12/2024 0742 by Georjean Delene SQUIBB, RN Outcome: Progressing 08/12/2024 0701 by Georjean Delene SQUIBB, RN Outcome: Progressing 08/12/2024 0700 by Georjean Delene SQUIBB, RN Outcome: Progressing   Problem: Education: Goal: Knowledge of General Education information will improve Description: Including pain rating scale, medication(s)/side effects and non-pharmacologic comfort measures Outcome: Progressing

## 2024-08-13 ENCOUNTER — Encounter: Payer: Self-pay | Admitting: Internal Medicine

## 2024-08-13 DIAGNOSIS — A419 Sepsis, unspecified organism: Secondary | ICD-10-CM | POA: Diagnosis not present

## 2024-08-13 LAB — CBC
HCT: 28.1 % — ABNORMAL LOW (ref 36.0–46.0)
Hemoglobin: 8.4 g/dL — ABNORMAL LOW (ref 12.0–15.0)
MCH: 30.1 pg (ref 26.0–34.0)
MCHC: 29.9 g/dL — ABNORMAL LOW (ref 30.0–36.0)
MCV: 100.7 fL — ABNORMAL HIGH (ref 80.0–100.0)
Platelets: 240 K/uL (ref 150–400)
RBC: 2.79 MIL/uL — ABNORMAL LOW (ref 3.87–5.11)
RDW: 30.2 % — ABNORMAL HIGH (ref 11.5–15.5)
WBC: 46 K/uL — ABNORMAL HIGH (ref 4.0–10.5)
nRBC: 0.9 % — ABNORMAL HIGH (ref 0.0–0.2)

## 2024-08-13 LAB — CULTURE, BLOOD (ROUTINE X 2)
Culture: NO GROWTH
Culture: NO GROWTH

## 2024-08-13 MED ORDER — LOPERAMIDE HCL 2 MG PO CAPS: 2.0000 mg | ORAL_CAPSULE | Freq: Four times a day (QID) | ORAL | Status: DC | PRN

## 2024-08-13 NOTE — TOC Progression Note (Signed)
 Transition of Care [facility]) - Progression Note    Patient Details  Name: Carla [name] MRN: [id number] Date of Birth: 09/20/1951  [facility]) CM/SW Contact  Brithney Bensen LITTIE Mo[place], [place] Phone Number: 08/13/2024, 9:43 AM  Clinical Narrative:    CSW contacted [employer] and initiated insurance auth process for [facility]. Facility can accept pt pending insurance auth. CSW will continue to follow.   Expected Discharge Plan: Skilled Nursing Facility Barriers to Discharge: Continued Medical Work up, SNF Pending bed offer, English [employer] Language [employer]               Expected Discharge Plan and Services     Post Acute Care Choice: Skilled Nursing Facility Living arrangements for the past 2 months: Single Family Home                                       Social Drivers of Health (SDOH) Interventions SDOH Screenings   Food Insecurity: No Food Insecurity (08/08/2024)  Housing: Low Risk  (08/08/2024)  Transportation Needs: No Transportation Needs (08/08/2024)  Utilities: Not At Risk (08/08/2024)  Alcohol Screen: Low Risk  (05/30/2024)  Depression (PHQ2-9): Low Risk  (07/19/2024)  Financial Resource Strain: Low Risk  (05/30/2024)  Physical Activity: Inactive (05/30/2024)  Social Connections: Unknown (08/08/2024)  Stress: No Stress Concern Present (05/30/2024)  Tobacco Use: Low Risk  (08/08/2024)  Health Literacy: Adequate Health Literacy (05/30/2024)    Readmission Risk Interventions     No data to display

## 2024-08-13 NOTE — Progress Notes (Signed)
 PROGRESS NOTE  Carla [name]  DOB: 1951-01-24  PCP: [name] [name], MD FMW:[id number]  DOA: 08/08/2024  LOS: 5 days  Hospital Day: 6  Subjective: Patient was seen and examined this morning. Sitting up in recliner.  Not in distress Husband at bedside.  Daughter on the phone Patient showed me heparin  subcu injection site in her abdomen with ecchymosis and bleeding. Has had multiple loose bowel movements in last 24 hours Afebrile, hemodynamically stable Labs from this morning with hemoglobin improved to 8.4, WBC count at 40,000.  Brief narrative: Carla [name] is a 73 y.o. female with PMH significant for myelodysplastic syndrome (chronic leukocytosis, chronic anemia), hypothyroidism, lumbar spine stenosis  11/10, patient underwent bilateral L4-L5 laminotomy/foraminotomies/medial facetectomy to decompress the bilateral L4 and L5 nerve roots, posterior lumbar interbody fusion by Dr. Mavis.  She was observed overnight, seen by PT next morning, was able to walk and was hence discharged to home.  Next morning at home, patient slid out of bed and fell sustaining 10 out of 10 pain and hence brought to the ED by EMS.  In the ED, patient had a low-grade temperature of 100, tachycardic to 100s, blood pressure in 140s, O2 sat 85% on room air, required 2 L oxygen nasal cannula Initial labs with WBC count 25.6, hemoglobin 8.5, platelet 190, lactic acid normal, procalcitonin slightly elevated, renal function normal Chest x-ray with minimal right basilar opacity EKG showed sinus tachycardia at 102 with QTc 421, no ST-T wave changes Blood culture was obtained Started on broad-spectrum antibiotics Admitted to [facility]  Assessment and plan: Generalized weakness, fall  Recent bilateral L4-L5 laminotomy/foraminotomies/medial facetectomy to decompress the bilateral L4 and L5 nerve roots, posterior lumbar interbody fusion -11/11 Dr. Mavis Presented to ED after a fall out of bed at home due to weakness on  postop day 2 Seen by neurosurgery.  Ruled out surgical wound infection Was initiated on Decadron  initially which was I stopped on 11/13 after discussion with neurosurgery Seen by PT.  SNF recommended Pain regimen --- Scheduled: Neurontin  300 mg 3 times daily, Tylenol  1 g 3 times daily --- PRN: IV Dilaudid , oxycodone , Robaxin  SIRS Right basilar pneumonia Acute respiratory failure with hypoxia Patient had low-grade temperature, tachycardia, leukocytosis, hypoxia and elevated procalcitonin level Chest x-ray showed right basilar opacity raising concern for pneumonia Currently on supplemental oxygen.  Check ambulatory oxygen requirement Blood culture has not shown any growth so far. Currently on 5-day course of IV Rocephin  and azithromycin  . WBC count in downward trend but chronically elevated due to myelodysplastic syndrome Encourage participation with PT.  Encourage use of incentive spirometry. Recent Labs  Lab 08/08/24 0840 08/08/24 0858 08/08/24 1139 08/09/24 0541 08/10/24 0451 08/11/24 0934 08/13/24 1129  WBC 35.6*  --   --  45.3* 35.4* 30.3* 46.0*  LATICACIDVEN  --  1.5 1.7  --   --   --   --   PROCALCITON 0.30  --   --   --   --   --   --    Myelodysplastic syndrome Chronic leukocytosis, chronic anemia Baseline WBC count close to 20,000.   WBC count peaked at 45,000 due to infection and steroid use.   Hemoglobin stable.  Improved to 8.4 today. No active bleeding.  Platelet count normal. Continue to monitor. Recent Labs  Lab 08/08/24 0840 08/09/24 0541 08/10/24 0451 08/11/24 0934 08/13/24 1129  WBC 35.6* 45.3* 35.4* 30.3* 46.0*  NEUTROABS 21.6*  --  25.3*  --   --  HGB 8.5* 7.9* 7.7* 7.5* 8.4*  HCT 28.2* 25.2* 24.4* 24.4* 28.1*  MCV 98.9 96.9 94.9 97.2 100.7*  PLT 190 168 182 196 240   New diagnosis of adrenal insufficiency 11/14, a.m. cortisol level was low at 2.3.   11/15 cosyntropin stimulation test was done .  Peak cortisol level at 60 minutes mark was  15.6.  Per 'OpenEvidence' guideline, peak cortisol level at less than 18 establishes a diagnosis of adrenal insufficiency.   Patient has been started on hydrocortisone 10 mg twice daily along with calcium and vitamin D  supplement. Already on PPI  CKD 3a Creatinine at baseline.  AKI ruled out   Recent Labs    02/23/24 1020 03/28/24 0903 04/25/24 1444 05/23/24 0914 06/20/24 1245 07/19/24 1102 08/08/24 0840 08/09/24 0541 08/10/24 0451 08/11/24 0934  BUN 19 18 26* 32* 41* 26* 13 11 15 14   CREATININE 1.02* 0.84 0.92 1.09* 1.22* 1.13* 1.09* 0.75 0.79 0.83  CO2 22 21* 21* 19* 22 21* 23 23 27 23    Constipation Was given Senokot twice daily and MiraLAX because of constipation.  Started him with loose bowel movement after that.  Currently both on hold.  Imodium as needed added.  Continue to monitor.   Prediabetes A1c 6.1 on October 2025  Hypothyroidism Continue Synthroid   GERD PPI  Mobility: Seen by PT  PT Orders: Active   PT Follow up Rec: Skilled Nursing-Short Term Rehab (<3 Hours/Day)08/10/2024 1500    Goals of care   Code Status: Full Code     DVT prophylaxis: Stopped heparin  subcu due to this morning because of injection site ecchymosis and bleeding. Place and maintain sequential compression device Start: 08/13/24 1106   Antimicrobials: IV Rocephin  and azithromycin  Fluid: None Consultants: Neurosurgery Family Communication: Family at bedside and on the phone.  Status: Inpatient Level of care:  Telemetry   Patient is from: Home Needs to continue in-hospital care: medically stable for discharge. Anticipated d/c to: SNF in process   Diet:  Diet Order             Diet regular Room service appropriate? Yes; Fluid consistency: Thin  Diet effective now                   Scheduled Meds:  acetaminophen   1,000 mg Oral TID   aspirin   81 mg Oral Daily   calcium-vitamin D   1 tablet Oral Q breakfast   Chlorhexidine  Gluconate Cloth  6 each Topical Daily    gabapentin   300 mg Oral TID   hydrocortisone  10 mg Oral BID   levothyroxine   25 mcg Oral Q0600   senna-docusate  1 tablet Oral QHS    PRN meds: bisacodyl, HYDROmorphone  (DILAUDID ) injection, loperamide, methocarbamol, ondansetron  **OR** ondansetron  (ZOFRAN ) IV, oxyCODONE , polyethylene glycol   Infusions:     Antimicrobials: Anti-infectives (From admission, onward)    Start     Dose/Rate Route Frequency Ordered Stop   08/09/24 1400  vancomycin  (VANCOCIN ) IVPB 1000 mg/200 mL premix  Status:  Discontinued        1,000 mg 200 mL/hr over 60 Minutes Intravenous Every 24 hours 08/08/24 1427 08/09/24 1245   08/09/24 0900  cefTRIAXone  (ROCEPHIN ) 2 g in sodium chloride  0.9 % 100 mL IVPB        2 g 200 mL/hr over 30 Minutes Intravenous Every 24 hours 08/08/24 1415 08/12/24 1112   08/08/24 1430  azithromycin  (ZITHROMAX ) 500 mg in sodium chloride  0.9 % 250 mL IVPB  500 mg 250 mL/hr over 60 Minutes Intravenous Every 24 hours 08/08/24 1415 08/09/24 1440   08/08/24 1430  vancomycin  (VANCOREADY) IVPB 1500 mg/300 mL        1,500 mg 150 mL/hr over 120 Minutes Intravenous  Once 08/08/24 1415 08/08/24 1737   08/08/24 0900  cefTRIAXone  (ROCEPHIN ) 2 g in sodium chloride  0.9 % 100 mL IVPB        2 g 200 mL/hr over 30 Minutes Intravenous Once 08/08/24 0855 08/08/24 0950   08/08/24 0900  azithromycin  (ZITHROMAX ) 500 mg in sodium chloride  0.9 % 250 mL IVPB        500 mg 250 mL/hr over 60 Minutes Intravenous  Once 08/08/24 0855 08/08/24 1026       Objective: Vitals:   08/13/24 0505 08/13/24 1057  BP: 124/65 117/65  Pulse: 70 81  Resp: 15 17  Temp: 98.3 F (36.8 C) 98.2 F (36.8 C)  SpO2: 96% 100%    Intake/Output Summary (Last 24 hours) at 08/13/2024 1546 Last data filed at 08/13/2024 1534 Gross per 24 hour  Intake 480 ml  Output --  Net 480 ml   Filed Weights   08/08/24 0829  Weight: 70.3 kg   Weight change:  Body mass index is 27.46 kg/m.   Physical Exam: General  exam: Pleasant, elderly Caucasian female.  Not in distress.  Skin: No rashes, lesions or ulcers. HEENT: Atraumatic, normocephalic, no obvious bleeding Lungs: Clear to auscultation bilaterally CVS: S1, S2, no murmur,   GI/Abd: Soft, nontender, nondistended, bowel sound present, slight ecchymosis and fresh bleeding noted heparin  injection site CNS: Alert, awake, oriented x 3 Psychiatry: Mood appropriate Extremities: No pedal edema, no calf tenderness,   Data Review: I have personally reviewed the laboratory data and studies available.  F/u labs ordered Unresulted Labs (From admission, onward)     Start     Ordered   08/14/24 0500  Basic metabolic panel with GFR  Tomorrow morning,   R       Question:  Specimen collection method  Answer:  Lab=Lab collect   08/13/24 1546   08/14/24 0500  CBC with Differential/Platelet  Tomorrow morning,   R       Question:  Specimen collection method  Answer:  Lab=Lab collect   08/13/24 1546            Signed, Chapman Rota, MD [facility] 08/13/2024

## 2024-08-13 NOTE — Progress Notes (Signed)
 Subjective: The patient is alert and pleasant.  She looks and feels much better.  Her husband is at the bedside.  Objective: Vital signs in last 24 hours: Temp:  [98 F (36.7 C)-98.3 F (36.8 C)] 98.3 F (36.8 C) (11/17 0505) Pulse Rate:  [70-77] 70 (11/17 0505) Resp:  [15-18] 15 (11/17 0505) BP: (109-130)/(65-84) 124/65 (11/17 0505) SpO2:  [93 %-98 %] 96 % (11/17 0505) Estimated body mass index is 27.46 kg/m as calculated from the following:   Height as of this encounter: 5' 3 (1.6 m).   Weight as of this encounter: 70.3 kg.   Intake/Output from previous day: 11/16 0701 - 11/17 0700 In: 480 [P.O.:480] Out: -  Intake/Output this shift: No intake/output data recorded.  Physical exam the patient is alert and oriented.  Her strength is normal.  Her lumbar incision is healing well.  I removed the dressing.  Lab Results: Recent Labs    08/11/24 0934  WBC 30.3*  HGB 7.5*  HCT 24.4*  PLT 196   BMET Recent Labs    08/11/24 0934  NA 135  K 3.5  CL 101  CO2 23  GLUCOSE 146*  BUN 14  CREATININE 0.83  CALCIUM 8.0*    Studies/Results: No results found.  Assessment/Plan: Postop day #7: The patient is doing well.  We are awaiting rehab placement.  LOS: 5 days     [name] 08/13/2024, 8:07 AM     Patient ID: Carla [name], female   DOB: 05/03/1951, 73 y.o.   MRN: [id number]

## 2024-08-13 NOTE — Plan of Care (Signed)
  Problem: Pain Managment: Goal: General experience of comfort will improve and/or be controlled Outcome: Progressing   Problem: Safety: Goal: Ability to remain free from injury will improve Outcome: Progressing   Problem: Skin Integrity: Goal: Risk for impaired skin integrity will decrease Outcome: Progressing

## 2024-08-14 DIAGNOSIS — M4316 Spondylolisthesis, lumbar region: Secondary | ICD-10-CM | POA: Diagnosis not present

## 2024-08-14 DIAGNOSIS — M6281 Muscle weakness (generalized): Secondary | ICD-10-CM | POA: Diagnosis not present

## 2024-08-14 DIAGNOSIS — Z9181 History of falling: Secondary | ICD-10-CM | POA: Diagnosis not present

## 2024-08-14 DIAGNOSIS — R2689 Other abnormalities of gait and mobility: Secondary | ICD-10-CM | POA: Diagnosis not present

## 2024-08-14 DIAGNOSIS — E2740 Unspecified adrenocortical insufficiency: Secondary | ICD-10-CM | POA: Diagnosis not present

## 2024-08-14 LAB — CBC WITH DIFFERENTIAL/PLATELET
Abs Immature Granulocytes: 6.92 K/uL — ABNORMAL HIGH (ref 0.00–0.07)
Basophils Absolute: 0.3 K/uL — ABNORMAL HIGH (ref 0.0–0.1)
Basophils Relative: 1 %
Eosinophils Absolute: 0.2 K/uL (ref 0.0–0.5)
Eosinophils Relative: 0 %
HCT: 27.1 % — ABNORMAL LOW (ref 36.0–46.0)
Hemoglobin: 7.9 g/dL — ABNORMAL LOW (ref 12.0–15.0)
Immature Granulocytes: 19 %
Lymphocytes Relative: 8 %
Lymphs Abs: 2.7 K/uL (ref 0.7–4.0)
MCH: 30.3 pg (ref 26.0–34.0)
MCHC: 29.2 g/dL — ABNORMAL LOW (ref 30.0–36.0)
MCV: 103.8 fL — ABNORMAL HIGH (ref 80.0–100.0)
Monocytes Absolute: 2.7 K/uL — ABNORMAL HIGH (ref 0.1–1.0)
Monocytes Relative: 8 %
Neutro Abs: 23 K/uL — ABNORMAL HIGH (ref 1.7–7.7)
Neutrophils Relative %: 64 %
Platelets: 185 K/uL (ref 150–400)
RBC: 2.61 MIL/uL — ABNORMAL LOW (ref 3.87–5.11)
RDW: 30.7 % — ABNORMAL HIGH (ref 11.5–15.5)
Smear Review: NORMAL
WBC: 35.8 K/uL — ABNORMAL HIGH (ref 4.0–10.5)
nRBC: 0.9 % — ABNORMAL HIGH (ref 0.0–0.2)

## 2024-08-14 LAB — BASIC METABOLIC PANEL WITH GFR
Anion gap: 8 (ref 5–15)
BUN: 12 mg/dL (ref 8–23)
CO2: 23 mmol/L (ref 22–32)
Calcium: 8.5 mg/dL — ABNORMAL LOW (ref 8.9–10.3)
Chloride: 108 mmol/L (ref 98–111)
Creatinine, Ser: 0.69 mg/dL (ref 0.44–1.00)
GFR, Estimated: >60 mL/min (ref >60)
Glucose, Bld: 75 mg/dL (ref 70–99)
Potassium: 4.3 mmol/L (ref 3.5–5.1)
Sodium: 139 mmol/L (ref 135–145)

## 2024-08-14 MED ORDER — BISACODYL 5 MG PO TBEC: 5.0000 mg | DELAYED_RELEASE_TABLET | Freq: Every day | ORAL | Status: AC | PRN

## 2024-08-14 MED ORDER — OYSTER SHELL CALCIUM/D3 500-5 MG-MCG PO TABS: 1.0000 | ORAL_TABLET | Freq: Every day | ORAL | Status: AC

## 2024-08-14 MED ORDER — ACETAMINOPHEN 500 MG PO TABS
1000.0000 mg | ORAL_TABLET | Freq: Three times a day (TID) | ORAL | Status: AC
Start: 2024-08-14 — End: ?

## 2024-08-14 MED ORDER — POLYETHYLENE GLYCOL 3350 17 G PO PACK: 17.0000 g | PACK | Freq: Every day | ORAL | Status: AC | PRN

## 2024-08-14 MED ORDER — OXYCODONE HCL 5 MG PO TABS: 5.0000 mg | ORAL_TABLET | Freq: Four times a day (QID) | ORAL | 0 refills | Status: DC | PRN

## 2024-08-14 MED ORDER — SENNOSIDES-DOCUSATE SODIUM 8.6-50 MG PO TABS: 1.0000 | ORAL_TABLET | Freq: Every day | ORAL | Status: AC

## 2024-08-14 MED ORDER — LOPERAMIDE HCL 2 MG PO CAPS: 2.0000 mg | ORAL_CAPSULE | Freq: Four times a day (QID) | ORAL | Status: AC | PRN

## 2024-08-14 MED ORDER — HYDROCORTISONE 10 MG PO TABS: 10.0000 mg | ORAL_TABLET | Freq: Two times a day (BID) | ORAL | Status: DC

## 2024-08-14 NOTE — Progress Notes (Signed)
 Per Arlice, MD  Recommendations at discharge:  You have been diagnosed with adrenal insufficiency and started on hydrocortisone 10 mg twice daily.  Outpatient referral to endocrinology has been given.  Continue vitamin D  and Protonix while on hydrocortisone.  Exercise caution on use of pain medicines  PDMP reviewed this encounter.  Opioid taper instructions: It is important to wean off of your opioid medication as soon as possible. If you do not need pain medication after your surgery it is ok to stop day one. Opioids include: Codeine, Hydrocodone (Norco, Vicodin), Oxycodone (Percocet, oxycontin ) and hydromorphone  amongst others. Long term and even short term use of opiods can cause: Increased pain response Dependence Constipation Depression Respiratory depression And more. Withdrawal symptoms can include Flu like symptoms Nausea, vomiting And more Techniques to manage these symptoms Hydrate well Eat regular healthy meals Stay active Use relaxation techniques(deep breathing, meditating, yoga) Do Not substitute Alcohol to help with tapering If you have been on opioids for less than two weeks and do not have pain than it is ok to stop all together. Plan to wean off of opioids This plan should start within one week post op of your joint replacement. Maintain the same interval or time between taking each dose and first decrease the dose. Cut the total daily intake of opioids by one tablet each day Next start to increase the time between doses. The last dose that should be eliminated is the evening dose.

## 2024-08-14 NOTE — Progress Notes (Signed)
 Attempted to give report to [facility] place [phone]). Was transferred to nurse and the no answer. Will try again later

## 2024-08-14 NOTE — Progress Notes (Signed)
Assisted patient to and from bedside commode.

## 2024-08-14 NOTE — Discharge Summary (Addendum)
 Physician Discharge Summary  Carla [name] FMW:[id number] DOB: 1950/11/16 DOA: 08/08/2024  PCP: [name] [name], MD  Admit date: 08/08/2024 Discharge date: 08/14/2024  Admitted from: home Discharge disposition: SNF  Recommendations at discharge:  You have been diagnosed with adrenal insufficiency and started on hydrocortisone 10 mg twice daily.  Outpatient referral to endocrinology has been given.  Continue vitamin D  and Protonix while on hydrocortisone. Exercise caution on use of pain medicines   Subjective: Patient was seen and examined this morning. Sitting up in recliner.  Not in distress.  Husband at bedside. Afebrile, hemodynamically stable.  Brief narrative: Carla [name] is a 73 y.o. female with PMH significant for myelodysplastic syndrome (chronic leukocytosis, chronic anemia), hypothyroidism, lumbar spine stenosis  11/10, patient underwent bilateral L4-L5 laminotomy/foraminotomies/medial facetectomy to decompress the bilateral L4 and L5 nerve roots, posterior lumbar interbody fusion by Dr. Mavis.  She was observed overnight, seen by PT next morning, was able to walk and was hence discharged to home.  Next morning at home, patient slid out of bed and fell sustaining 10 out of 10 pain and hence brought to the ED by EMS.  In the ED, patient had a low-grade temperature of 100, tachycardic to 100s, blood pressure in 140s, O2 sat 85% on room air, required 2 L oxygen nasal cannula Initial labs with WBC count 25.6, hemoglobin 8.5, platelet 190, lactic acid normal, procalcitonin slightly elevated, renal function normal Chest x-ray with minimal right basilar opacity EKG showed sinus tachycardia at 102 with QTc 421, no ST-T wave changes Blood culture was obtained Started on broad-spectrum antibiotics Admitted to [facility] course: Generalized weakness, fall  Recent bilateral L4-L5 laminotomy/foraminotomies/medial facetectomy to decompress the bilateral L4 and L5 nerve roots,  posterior lumbar interbody fusion -11/11 Dr. Mavis Presented to ED after a fall out of bed at home due to weakness on postop day 2 Seen by neurosurgery.  Ruled out surgical wound infection Was initiated on Decadron  initially which was I stopped on 11/13 after discussion with neurosurgery Seen by PT.  SNF recommended Pain regimen --- Scheduled: Neurontin  300 mg 3 times daily, Tylenol  1 g 3 times daily --- PRN: oxycodone , Robaxin  SIRS Right basilar pneumonia Acute respiratory failure with hypoxia Patient had low-grade temperature, tachycardia, leukocytosis, hypoxia and elevated procalcitonin level Chest x-ray showed right basilar opacity raising concern for pneumonia She was started on empiric antibiotics Initially required supplemental oxygen Clinically improved.  Oxygen weaned off Blood culture has not shown any growth so far. Completed 5-day course of 5-day course of IV Rocephin  and azithromycin  . Encourage participation with PT.  Encourage use of incentive spirometry. Recent Labs  Lab 08/08/24 0840 08/08/24 0858 08/08/24 1139 08/09/24 0541 08/10/24 0451 08/11/24 0934 08/13/24 1129 08/14/24 0550  WBC 35.6*  --   --  45.3* 35.4* 30.3* 46.0* 35.8*  LATICACIDVEN  --  1.5 1.7  --   --   --   --   --   PROCALCITON 0.30  --   --   --   --   --   --   --    Myelodysplastic syndrome Chronic leukocytosis, chronic anemia Baseline WBC count close to 20,000.   WBC count was initially due to infection.  Trended down but rising up again now after initiation of hydrocortisone.  Hemoglobin stable.  Improved to 8.4 today. No active bleeding.  Platelet count normal. Continue to monitor. Recent Labs  Lab 08/08/24 0840 08/09/24 0541 08/10/24 0451 08/11/24 0934 08/13/24 1129 08/14/24  0550  WBC 35.6* 45.3* 35.4* 30.3* 46.0* 35.8*  NEUTROABS 21.6*  --  25.3*  --   --  23.0*  HGB 8.5* 7.9* 7.7* 7.5* 8.4* 7.9*  HCT 28.2* 25.2* 24.4* 24.4* 28.1* 27.1*  MCV 98.9 96.9 94.9 97.2 100.7*  103.8*  PLT 190 168 182 196 240 185   New diagnosis of adrenal insufficiency 11/14, a.m. cortisol level was low at 2.3.   11/15 cosyntropin stimulation test was done .  Peak cortisol level at 60 minutes mark was 15.6.  Per 'OpenEvidence' guideline, peak cortisol level at less than 18 establishes a diagnosis of adrenal insufficiency.   Patient has been started on hydrocortisone 10 mg twice daily along with calcium and vitamin D  supplement. Already on PPI  CKD 3a Creatinine at baseline.  AKI ruled out   Recent Labs    03/28/24 0903 04/25/24 1444 05/23/24 0914 06/20/24 1245 07/19/24 1102 08/08/24 0840 08/09/24 0541 08/10/24 0451 08/11/24 0934 08/14/24 0550  BUN 18 26* 32* 41* 26* 13 11 15 14 12   CREATININE 0.84 0.92 1.09* 1.22* 1.13* 1.09* 0.75 0.79 0.83 0.69  CO2 21* 21* 19* 22 21* 23 23 27 23 23    Constipation Improved with bowel regimen.  Continue Senokot at bedtime and MiraLAX PRN  Prediabetes A1c 6.1 on October 2025  Hypothyroidism Continue Synthroid   GERD PPI  Goals of care   Code Status: Full Code   Diet:  Diet Order             Diet general           Diet regular Room service appropriate? Yes; Fluid consistency: Thin  Diet effective now                   Nutritional status:  Body mass index is 27.46 kg/m.       Wounds:  - Wound 08/06/24 1527 Surgical Closed Surgical Incision Back Other (Comment) (Active)  Date First Assessed/Time First Assessed: 08/06/24 1527   Primary Wound Type: Surgical  Secondary Wound Type - Surgical: Closed Surgical Incision  Location: Back  Location Orientation: Other (Comment)    Assessments 08/06/2024  4:56 PM 08/13/2024  8:00 PM  Site / Wound Assessment Dressing in place / Unable to assess Dressing in place / Unable to assess  Drainage Amount -- Small  Dressing Type Honeycomb Honeycomb  Dressing Status Clean, Dry, Intact Old drainage     No associated orders.    Discharge Medications:   Allergies as of  08/14/2024       Reactions   Codeine Nausea Only        Medication List     STOP taking these medications    Aspirin  Low Dose 81 MG chewable tablet Generic drug: aspirin    docusate sodium 100 MG capsule Commonly known as: COLACE   oxyCODONE -acetaminophen  5-325 MG tablet Commonly known as: PERCOCET/ROXICET   Vitamin D  (Ergocalciferol ) 1.25 MG (50000 UNIT) Caps capsule Commonly known as: DRISDOL        TAKE these medications    acetaminophen  500 MG tablet Commonly known as: TYLENOL  Take 2 tablets (1,000 mg total) by mouth 3 (three) times daily.   bisacodyl 5 MG EC tablet Commonly known as: DULCOLAX Take 1 tablet (5 mg total) by mouth daily as needed for moderate constipation.   calcium-vitamin D  500-5 MG-MCG tablet Commonly known as: OSCAL WITH D Take 1 tablet by mouth daily with breakfast.   cetirizine 10 MG tablet Commonly known as: ZYRTEC Take 10 mg by  mouth daily.   Darbepoetin Alfa  500 MCG/ML Sosy injection Commonly known as: ARANESP  Inject 500 mcg into the skin See admin instructions. Inject 500 mcg into the skin every week as needed   gabapentin  300 MG capsule Commonly known as: NEURONTIN  Take 300 mg by mouth 3 (three) times daily.   hydrocortisone 10 MG tablet Commonly known as: CORTEF Take 1 tablet (10 mg total) by mouth 2 (two) times daily.   levothyroxine  25 MCG tablet Commonly known as: SYNTHROID  TAKE ONE TAB BY MOUTH ONCE DAILY. TAKE ON AN EMPTY STOMACH WITH A GLASS OF WATER ATLEAST 30-60 MINUTES BEFORE BREAKFAST   loperamide 2 MG capsule Commonly known as: IMODIUM Take 1 capsule (2 mg total) by mouth every 6 (six) hours as needed for diarrhea or loose stools.   luspatercept -aamt 75 MG subcutaneous injection Commonly known as: REBLOZYL  Inject into the skin every 28 (twenty-eight) days.   methocarbamol 500 MG tablet Commonly known as: ROBAXIN Take 1 tablet (500 mg total) by mouth 3 (three) times daily. What changed:  when to take  this reasons to take this   omeprazole  20 MG capsule Commonly known as: PRILOSEC Take 1 capsule (20 mg total) by mouth daily.   oxyCODONE  5 MG immediate release tablet Commonly known as: Oxy IR/ROXICODONE  Take 1 tablet (5 mg total) by mouth every 6 (six) hours as needed for moderate pain (pain score 4-6).   polyethylene glycol 17 g packet Commonly known as: MIRALAX / GLYCOLAX Take 17 g by mouth daily as needed for mild constipation.   promethazine  25 MG tablet Commonly known as: PHENERGAN  Take 1 tablet (25 mg total) by mouth every 6 (six) hours as needed for nausea or vomiting. What changed:  how much to take when to take this   senna-docusate 8.6-50 MG tablet Commonly known as: Senokot-S Take 1 tablet by mouth at bedtime.               Discharge Care Instructions  (From admission, onward)           Start     Ordered   08/14/24 0000  Discharge wound care:        08/14/24 1133             Follow ups:    Contact information for follow-up providers     [name], [name], MD Follow up.   Specialties: Family Medicine, Radiology Contact information: [address] [place] [address] [phone]              Contact information for after-discharge care     Destination     [facility] .   Service: Skilled Nursing Contact information: [address] [place]  [address] [phone]                     Discharge Instructions:   Discharge Instructions     Ambulatory referral to Endocrinology   Complete by: As directed    New diagnosis of adrenal insufficiency   Call MD for:  difficulty breathing, headache or visual disturbances   Complete by: As directed    Call MD for:  extreme fatigue   Complete by: As directed    Call MD for:  hives   Complete by: As directed    Call MD for:  persistant dizziness or light-headedness   Complete by: As directed    Call MD for:   persistant nausea and vomiting   Complete by:  As directed    Call MD for:  severe uncontrolled pain   Complete by: As directed    Call MD for:  temperature >100.4   Complete by: As directed    Diet general   Complete by: As directed    Discharge instructions   Complete by: As directed    Recommendations at discharge:   You have been diagnosed with adrenal insufficiency and started on hydrocortisone 10 mg twice daily.  Outpatient referral to endocrinology has been given.  Continue vitamin D  and Protonix while on hydrocortisone.  Exercise caution on use of pain medicines  PDMP reviewed this encounter.   Opioid taper instructions: It is important to wean off of your opioid medication as soon as possible. If you do not need pain medication after your surgery it is ok to stop day one. Opioids include: Codeine, Hydrocodone (Norco, Vicodin), Oxycodone (Percocet, oxycontin ) and hydromorphone  amongst others.  Long term and even short term use of opiods can cause: Increased pain response Dependence Constipation Depression Respiratory depression And more.  Withdrawal symptoms can include Flu like symptoms Nausea, vomiting And more Techniques to manage these symptoms Hydrate well Eat regular healthy meals Stay active Use relaxation techniques(deep breathing, meditating, yoga) Do Not substitute Alcohol to help with tapering If you have been on opioids for less than two weeks and do not have pain than it is ok to stop all together.  Plan to wean off of opioids This plan should start within one week post op of your joint replacement. Maintain the same interval or time between taking each dose and first decrease the dose.  Cut the total daily intake of opioids by one tablet each day Next start to increase the time between doses. The last dose that should be eliminated is the evening dose.        General discharge instructions: Follow with Primary MD [name], [name], MD in 7 days   Please request your PCP  to go over your hospital tests, procedures, radiology results at the follow up. Please get your medicines reviewed and adjusted.  Your PCP [place] decide to repeat certain labs or tests as needed. Do not drive, operate heavy machinery, perform activities at heights, swimming or participation in water activities or provide baby sitting services if your were admitted for syncope or siezures until you have seen by Primary MD or a Neurologist and advised to do so again. [place]  Controlled Substance Reporting System database was reviewed. Do not drive, operate heavy machinery, perform activities at heights, swim, participate in water activities or provide baby-sitting services while on medications for pain, sleep and mood until your outpatient physician has reevaluated you and advised to do so again.  You are strongly recommended to comply with the dose, frequency and duration of prescribed medications. Activity: As tolerated with Full fall precautions use walker/cane & assistance as needed Avoid using any recreational substances like cigarette, tobacco, alcohol, or non-prescribed drug. If you experience worsening of your admission symptoms, develop shortness of breath, life threatening emergency, suicidal or homicidal thoughts you must seek medical attention immediately by calling 911 or calling your MD immediately  if symptoms less severe. You must read complete instructions/literature along with all the possible adverse reactions/side effects for all the medicines you take and that have been prescribed to you. Take any new medicine only after you have completely understood and accepted all the possible adverse reactions/side effects.  Wear Seat belts while driving. You were cared for by a hospitalist during your  hospital stay. If you have any questions about your discharge medications or the care you received while you were in the hospital after you are discharged, you can call  the unit and ask to speak with the hospitalist or the covering physician. Once you are discharged, your primary care physician will handle any further medical issues. Please note that NO REFILLS for any discharge medications will be authorized once you are discharged, as it is imperative that you return to your primary care physician (or establish a relationship with a primary care physician if you do not have one).   Discharge wound care:   Complete by: As directed    Increase activity slowly   Complete by: As directed        Discharge Exam:   Vitals:   08/13/24 1707 08/13/24 2106 08/14/24 0500 08/14/24 0903  BP: 132/69 134/65 125/60 127/67  Pulse: 69 77 73 80  Resp: 17 18 18 18   Temp: 97.9 F (36.6 C) 98.4 F (36.9 C) 98.1 F (36.7 C) 98.4 F (36.9 C)  TempSrc: Oral Oral Oral Oral  SpO2: 97% 96% 93% 98%  Weight:      Height:        Body mass index is 27.46 kg/m.  General exam: Pleasant, elderly Caucasian female.  Not in distress.  Skin: No rashes, lesions or ulcers. HEENT: Atraumatic, normocephalic, no obvious bleeding Lungs: Clear to auscultation bilaterally CVS: S1, S2, no murmur,   GI/Abd: Soft, nontender, nondistended, bowel sound present, slight ecchymosis and fresh bleeding noted heparin  injection site CNS: Alert, awake, oriented x 3 Psychiatry: Mood appropriate Extremities: No pedal edema, no calf tenderness,   Data Review: I have personally reviewed the laboratory data and studies available.   The results of significant diagnostics from this hospitalization (including imaging, microbiology, ancillary and laboratory) are listed below for reference.    Procedures and Diagnostic Studies:   CT Angio Chest PE W and/or Wo Contrast Result Date: 08/09/2024 CLINICAL DATA:  Shortness of breath EXAM: CT ANGIOGRAPHY CHEST WITH CONTRAST TECHNIQUE: Multidetector CT imaging of the chest was performed using the standard protocol during bolus administration of intravenous  contrast. Multiplanar CT image reconstructions and MIPs were obtained to evaluate the vascular anatomy. RADIATION DOSE REDUCTION: This exam was performed according to the departmental dose-optimization program which includes automated exposure control, adjustment of the mA and/or kV according to patient size and/or use of iterative reconstruction technique. CONTRAST:  75mL OMNIPAQUE  IOHEXOL  350 MG/ML SOLN COMPARISON:  Chest x-ray from the previous day. FINDINGS: Cardiovascular: Thoracic aorta shows mild atherosclerotic calcifications. No aneurysmal dilatation or dissection is noted. Pulmonary artery shows a normal branching pattern. No filling defect to suggest pulmonary embolism is seen. Mild coronary calcifications are noted. Mediastinum/Nodes: Thoracic inlet is within normal limits. No hilar or mediastinal adenopathy is noted. The esophagus as visualized is within normal limits. Lungs/Pleura: Mild left basilar atelectasis is seen. More marked consolidation is noted in the right lower lobe. No sizable effusion is seen. No parenchymal nodules are noted. Upper Abdomen: Visualized upper abdomen shows no acute abnormality. Musculoskeletal: Bony structures show postsurgical change in the left shoulder. No acute rib abnormality is noted. Review of the MIP images confirms the above findings. IMPRESSION: No evidence of pulmonary embolism. Bibasilar changes worst on the right with more marked consolidation identified. Aortic Atherosclerosis (ICD10-I70.0). Electronically Signed   By: Oneil Devonshire M.D.   On: 08/09/2024 02:05   DG Hand 2 View Left Result Date: 08/08/2024 EXAM: 1 or 2 VIEW(S)  XRAY OF THE LEFT HAND 08/08/2024 06:37:11 PM COMPARISON: None available. CLINICAL HISTORY: swelling. FINDINGS: BONES AND JOINTS: Scattered degenerative arthritis greatest at the 1st [facility] joint. No acute fracture. No focal osseous lesion. No joint dislocation. SOFT TISSUES: Mild swelling about the hand and wrist. IMPRESSION: 1. No acute  osseous abnormality. 2. Mild swelling about the hand and wrist. Electronically signed by: Norman Gatlin MD 08/08/2024 06:41 PM EST RP Workstation: [id number]   DG Chest Port 1 View if patient is in a treatment room. Result Date: 08/08/2024 EXAM: 1 VIEW(S) XRAY OF THE CHEST 08/08/2024 08:56:00 AM COMPARISON: 05/17/2020 status post left shoulder arthroplasty. CLINICAL HISTORY: Suspected Sepsis. FINDINGS: LUNGS AND PLEURA: Elevated right hemidiaphragm. Minimal right basilar opacity is noted concerning for possible pneumonia or atelectasis. No pulmonary edema. No pleural effusion. No pneumothorax. HEART AND MEDIASTINUM: No acute abnormality of the cardiac and mediastinal silhouettes. BONES AND SOFT TISSUES: Status post left shoulder arthroplasty. No acute osseous abnormality. IMPRESSION: 1. Minimal right basilar opacity, compatible with atelectasis or pneumonia. Electronically signed by: Lynwood Seip MD 08/08/2024 09:12 AM EST RP Workstation: [id number]     Labs:   Basic Metabolic Panel: Recent Labs  Lab 08/08/24 0840 08/09/24 0541 08/10/24 0451 08/11/24 0934 08/14/24 0550  NA 135 132* 137 135 139  K 4.1 4.4 3.8 3.5 4.3  CL 99 98 100 101 108  CO2 23 23 27 23 23   GLUCOSE 124* 123* 101* 146* 75  BUN 13 11 15 14 12   CREATININE 1.09* 0.75 0.79 0.83 0.69  CALCIUM 8.4* 8.7* 9.1 8.0* 8.5*  MG 1.8 1.7  --   --   --    GFR Estimated Creatinine Clearance: 58 mL/min (by C-G formula based on SCr of 0.69 mg/dL). Liver Function Tests: Recent Labs  Lab 08/08/24 0840 08/09/24 0541  AST 43* 35  ALT 18 18  ALKPHOS 39 34*  BILITOT 1.2 0.7  PROT 7.5 6.8  ALBUMIN 3.4* 2.7*   No results for input(s): LIPASE, AMYLASE in the last 168 hours. No results for input(s): AMMONIA in the last 168 hours. Coagulation profile Recent Labs  Lab 08/08/24 0840 08/09/24 0541  INR 1.4* 1.4*    CBC: Recent Labs  Lab 08/08/24 0840 08/09/24 0541 08/10/24 0451 08/11/24 0934 08/13/24 1129  08/14/24 0550  WBC 35.6* 45.3* 35.4* 30.3* 46.0* 35.8*  NEUTROABS 21.6*  --  25.3*  --   --  23.0*  HGB 8.5* 7.9* 7.7* 7.5* 8.4* 7.9*  HCT 28.2* 25.2* 24.4* 24.4* 28.1* 27.1*  MCV 98.9 96.9 94.9 97.2 100.7* 103.8*  PLT 190 168 182 196 240 185   Cardiac Enzymes: No results for input(s): CKTOTAL, CKMB, CKMBINDEX, TROPONINI in the last 168 hours. BNP: Invalid input(s): POCBNP CBG: No results for input(s): GLUCAP in the last 168 hours. D-Dimer No results for input(s): DDIMER in the last 72 hours. Hgb A1c No results for input(s): HGBA1C in the last 72 hours. Lipid Profile No results for input(s): CHOL, HDL, LDLCALC, TRIG, CHOLHDL, LDLDIRECT in the last 72 hours. Thyroid  function studies No results for input(s): TSH, T4TOTAL, T3FREE, THYROIDAB in the last 72 hours.  Invalid input(s): FREET3 Anemia work up No results for input(s): VITAMINB12, FOLATE, FERRITIN, TIBC, IRON, RETICCTPCT in the last 72 hours. Microbiology Recent Results (from the past 240 hours)  Culture, blood (Routine x 2)     Status: None   Collection Time: 08/08/24  8:36 AM   Specimen: BLOOD  Result Value Ref Range Status   Specimen Description BLOOD SITE NOT  SPECIFIED  Final   Special Requests   Final    BOTTLES DRAWN AEROBIC AND ANAEROBIC Blood Culture results [place] not be optimal due to an inadequate volume of blood received in culture bottles   Culture   Final    NO GROWTH 5 DAYS Performed at [facility], [address], [place], [place] [address]    Report Status 08/13/2024 FINAL  Final  Culture, blood (Routine x 2)     Status: None   Collection Time: 08/08/24  8:40 AM   Specimen: BLOOD RIGHT HAND  Result Value Ref Range Status   Specimen Description BLOOD RIGHT HAND  Final   Special Requests   Final    BOTTLES DRAWN AEROBIC AND ANAEROBIC Blood Culture results [place] not be optimal due to an inadequate volume of blood received in culture bottles    Culture   Final    NO GROWTH 5 DAYS Performed at [facility], [address], [place], [place] [address]    Report Status 08/13/2024 FINAL  Final  Resp panel by RT-PCR (RSV, Flu A&B, Covid) Anterior Nasal Swab     Status: None   Collection Time: 08/08/24 10:22 AM   Specimen: Anterior Nasal Swab  Result Value Ref Range Status   SARS Coronavirus 2 by RT PCR NEGATIVE NEGATIVE Final   Influenza A by PCR NEGATIVE NEGATIVE Final   Influenza B by PCR NEGATIVE NEGATIVE Final    Comment: (NOTE) The Xpert Xpress SARS-CoV-2/FLU/RSV plus assay is intended as an aid in the diagnosis of influenza from Nasopharyngeal swab specimens and should not be used as a sole basis for treatment. Nasal washings and aspirates are unacceptable for Xpert Xpress SARS-CoV-2/FLU/RSV testing.  Fact Sheet for Patients: bloggercourse.com  Fact Sheet for Healthcare Providers: seriousbroker.it  This test is not yet approved or cleared by the [place]  FDA and has been authorized for detection and/or diagnosis of SARS-CoV-2 by FDA under an Emergency Use Authorization (EUA). This EUA will remain in effect (meaning this test can be used) for the duration of the COVID-19 declaration under Section 564(b)(1) of the Act, 21 U.S.C. section 360bbb-3(b)(1), unless the authorization is terminated or revoked.     Resp Syncytial Virus by PCR NEGATIVE NEGATIVE Final    Comment: (NOTE) Fact Sheet for Patients: bloggercourse.com  Fact Sheet for Healthcare Providers: seriousbroker.it  This test is not yet approved or cleared by the [place]  FDA and has been authorized for detection and/or diagnosis of SARS-CoV-2 by FDA under an Emergency Use Authorization (EUA). This EUA will remain in effect (meaning this test can be used) for the duration of the COVID-19 declaration under Section 564(b)(1) of the Act, 21  U.S.C. section 360bbb-3(b)(1), unless the authorization is terminated or revoked.  Performed at [facility], [address] [place], [place] [address]     Time coordinating discharge: 45 minutes  Signed: Falon Flinchum  [facility] 08/14/2024, 11:35 AM

## 2024-08-14 NOTE — Progress Notes (Signed)
 Physical Therapy Treatment Patient Details Name: Carla [name] MRN: [id number] DOB: 1951/02/15 Today's Date: 08/14/2024   History of Present Illness Carla [name] is a 73 y.o. female admitted 08/08/24 for sepsis after L4-5 PLIF (11/10). Chest x-ray shows minimal right basilar opacity that [place] be compatible with atelectasis or pneumonia. PMHx: CA, carpal tunnel release (bilateral), osteoporosis, hypothyroidism, syncope, collapse, myelodysplastic syndrome, and RSA.    PT Comments  Pt up on [facility] on arrival, agreeable to session and demonstrating good progress towards acute goals. Pt requiring up to mod A to complete transfers sit<>stand and grossly min A for mobility once standing. Pt progressing gait distance with RW for support and up min A to steady with chair follow for safety as pt continues to fatigue quickly due to LE weakness and pain. Pt up in chair at end of session with all needs met. Patient will benefit from continued inpatient follow up therapy, <3 hours/day, will continue to follow acutely.    If plan is discharge home, recommend the following: Two people to help with walking and/or transfers;Two people to help with bathing/dressing/bathroom;Assistance with cooking/housework;Assist for transportation;Help with stairs or ramp for entrance;Supervision due to cognitive status   Can travel by private vehicle     No  Equipment Recommendations  Wheelchair (measurements PT);Wheelchair cushion (measurements PT)    Recommendations for Other Services       Precautions / Restrictions Precautions Precautions: Back;Fall Precaution Booklet Issued: No Recall of Precautions/Restrictions: Intact Precaution/Restrictions Comments: Pt recalled 3/3 back precautions. Required Braces or Orthoses: Spinal Brace Spinal Brace: Lumbar corset;Other (comment) (brace donned on arrival) Restrictions Weight Bearing Restrictions Per Provider Order: No     Mobility  Bed Mobility Overal bed mobility: Needs  Assistance             General bed mobility comments: pt up on BSC on arrival, seated in recliner at end of session    Transfers Overall transfer level: Needs assistance Equipment used: Rolling walker (2 wheels) Transfers: Sit to/from Stand, Bed to chair/wheelchair/BSC Sit to Stand: Min assist, Mod assist   Step pivot transfers: Min assist       General transfer comment: pt stood from elevated BSC with min A with good hand plcement, mod A to rise from lower recliner with light cues and increased time for optimal hand placement, min A to steady for step pivot    Ambulation/Gait Ambulation/Gait assistance: Min assist, +2 safety/equipment (+2 for chair follow) Gait Distance (Feet): 30 Feet Assistive device: Rolling walker (2 wheels) Gait Pattern/deviations: Step-to pattern, Narrow base of support Gait velocity: decreased     General Gait Details: Pt taking short guarded steps with RW for support, light cues for upright posture and for depression of shoulders, chair follow for safety   Stairs             Wheelchair Mobility     Tilt Bed    Modified Rankin (Stroke Patients Only)       Balance Overall balance assessment: Needs assistance, History of Falls Sitting-balance support: Feet supported, Bilateral upper extremity supported Sitting balance-Leahy Scale: Fair Sitting balance - Comments: Pt sat EOB with CGA. Assist to don lumbar corset.   Standing balance support: Bilateral upper extremity supported, During functional activity, Reliant on assistive device for balance Standing balance-Leahy Scale: Poor Standing balance comment: Pt dependent on RW  Communication Communication Communication: No apparent difficulties  Cognition Arousal: Alert Behavior During Therapy: WFL for tasks assessed/performed   PT - Cognitive impairments: No apparent impairments                         Following commands:  Intact Following commands impaired: Follows one step commands inconsistently    Cueing Cueing Techniques: Verbal cues, Gestural cues  Exercises      General Comments General comments (skin integrity, edema, etc.): VSS on RA, husband [name] present and supportive      Pertinent Vitals/Pain Pain Assessment Pain Assessment: Faces Faces Pain Scale: Hurts a little bit Pain Location: Back Pain Descriptors / Indicators: Discomfort, Aching, Sore Pain Intervention(s): Monitored during session, Limited activity within patient's tolerance, Repositioned    Home Living                          Prior Function            PT Goals (current goals can now be found in the care plan section) Acute Rehab PT Goals Patient Stated Goal: Get stronger and return PT Goal Formulation: With patient/family Time For Goal Achievement: 08/23/24 Progress towards PT goals: Progressing toward goals    Frequency    Min 2X/week      PT Plan      Co-evaluation              AM-PAC PT 6 Clicks Mobility   Outcome Measure  Help needed turning from your back to your side while in a flat bed without using bedrails?: A Lot Help needed moving from lying on your back to sitting on the side of a flat bed without using bedrails?: A Lot Help needed moving to and from a bed to a chair (including a wheelchair)?: A Lot Help needed standing up from a chair using your arms (e.g., wheelchair or bedside chair)?: A Lot Help needed to walk in hospital room?: A Lot Help needed climbing 3-5 steps with a railing? : Total 6 Click Score: 11    End of Session   Activity Tolerance: Patient tolerated treatment well Patient left: in chair;with call bell/phone within reach;with family/visitor present;with SCD's reapplied Nurse Communication: Mobility status PT Visit Diagnosis: Muscle weakness (generalized) (M62.81);Difficulty in walking, not elsewhere classified (R26.2);Other abnormalities of gait and  mobility (R26.89);Unsteadiness on feet (R26.81);Pain Pain - part of body:  (Back)     Time: 8975-8952 PT Time Calculation (min) (ACUTE ONLY): 23 min  Charges:    $Gait Training: 8-22 mins $Therapeutic Activity: 8-22 mins PT General Charges $$ ACUTE PT VISIT: 1 Visit                     Lerline Valdivia R. PTA Acute Rehabilitation Services Office: [phone]   Therisa CHRISTELLA Boor 08/14/2024, 10:58 AM

## 2024-08-14 NOTE — Progress Notes (Signed)
 Subjective: The patient is alert and pleasant.  She is in no apparent distress.  She feels better/less weak since adding hydrocortisone she is awaiting rehab placement.  Objective: Vital signs in last 24 hours: Temp:  [97.9 F (36.6 C)-98.4 F (36.9 C)] 98.1 F (36.7 C) (11/18 0500) Pulse Rate:  [69-81] 73 (11/18 0500) Resp:  [17-18] 18 (11/18 0500) BP: (117-134)/(60-69) 125/60 (11/18 0500) SpO2:  [93 %-100 %] 93 % (11/18 0500) Estimated body mass index is 27.46 kg/m as calculated from the following:   Height as of this encounter: 5' 3 (1.6 m).   Weight as of this encounter: 70.3 kg.   Intake/Output from previous day: 11/17 0701 - 11/18 0700 In: 240 [P.O.:240] Out: -  Intake/Output this shift: No intake/output data recorded.  Physical exam patient is alert and pleasant.  Her strength is normal.  He looks well.  Lab Results: Recent Labs    08/11/24 0934 08/13/24 1129  WBC 30.3* 46.0*  HGB 7.5* 8.4*  HCT 24.4* 28.1*  PLT 196 240   BMET Recent Labs    08/11/24 0934 08/14/24 0550  NA 135 139  K 3.5 4.3  CL 101 108  CO2 23 23  GLUCOSE 146* 75  BUN 14 12  CREATININE 0.83 0.69  CALCIUM 8.0* 8.5*    Studies/Results: No results found.  Assessment/Plan: Is post lumbar fusion: We are awaiting rehab placement  Adrenal insufficiency: I appreciate Dr. Barrington excellent care of this patient.  LOS: 6 days     Carla [name] 08/14/2024, 7:58 AM     Patient ID: Carla [name], female   DOB: [birth year], 73 y.o.   MRN: [id number]

## 2024-08-14 NOTE — Progress Notes (Signed)
 Attempted to give report to [facility] place [phone]). Was transferred to nurse and the no answer.

## 2024-08-14 NOTE — Care Management Important Message (Signed)
 Important Message  Patient Details  Name: Carla [name] MRN: [id number] Date of Birth: 02-18-1951   Important Message Given:  Yes - Medicare IM     [name] 08/14/2024, 1:20 PM

## 2024-08-14 NOTE — TOC Transition Note (Signed)
 Transition of Care [facility]) - Discharge Note   Patient Details  Name: Carla [name] MRN: [id number] Date of Birth: 04-22-1951  [facility] CM/SW Contact:  Jeoffrey LITTIE Maranda [place] Phone Number: 08/14/2024, 12:02 PM   Clinical Narrative:    Patient will DC to: [name] Anticipated DC date: 08/14/24 Family notified: Yes Transport by: [facility]   Per MD patient ready for DC to [facility]. RN to call report prior to discharge [phone] room 804. RN, patient, patient's family, and facility notified of DC. Discharge Summary and FL2 sent to facility. DC packet on chart. Ambulance transport requested for patient.   CSW will sign off for now as social work intervention is no longer needed. Please consult us  again if new needs arise.     Final next level of care: Skilled Nursing Facility Barriers to Discharge: Barriers Resolved   Patient Goals and CMS Choice Patient states their goals for this hospitalization and ongoing recovery are:: SNF   Choice offered to / list presented to : Spouse      Discharge Placement   Existing PASRR number confirmed : 08/14/24          Patient [facility] bed at: [facility] Patient to be transferred to facility by: [facility] Name of family member notified: [name] Patient and family notified of of transfer: 08/14/24  Discharge Plan and Services Additional resources added to the After Visit Summary for       Post Acute Care Choice: Skilled Nursing Facility                               Social Drivers of Health (SDOH) Interventions SDOH Screenings   Food Insecurity: No Food Insecurity (08/08/2024)  Housing: Low Risk  (08/08/2024)  Transportation Needs: No Transportation Needs (08/08/2024)  Utilities: Not At Risk (08/08/2024)  Alcohol Screen: Low Risk  (05/30/2024)  Depression (PHQ2-9): Low Risk  (07/19/2024)  Financial Resource Strain: Low Risk  (05/30/2024)  Physical Activity: Inactive (05/30/2024)  Social Connections: Unknown (08/08/2024)   Stress: No Stress Concern Present (05/30/2024)  Tobacco Use: Low Risk  (08/08/2024)  Health Literacy: Adequate Health Literacy (05/30/2024)     Readmission Risk Interventions     No data to display

## 2024-08-14 NOTE — TOC Progression Note (Signed)
 Transition of Care [facility]) - Progression Note    Patient Details  Name: Carla [name] MRN: [id number] Date of Birth: 1951/04/04  Transition of Care [facility]) CM/SW Contact  Marria Mathison LITTIE [place], [place] Phone Number: 08/14/2024, 8:51 AM  Clinical Narrative:    CSW received insurance auth approval for [name]. Auth ID [id number] effective 11/17-11/22. CSW confirmed bed availability with facility, pt can admit when medically ready for DC.    Expected Discharge Plan: Skilled Nursing Facility Barriers to Discharge: Continued Medical Work up, SNF Pending bed offer, English As A Second Language Teacher               Expected Discharge Plan and Services     Post Acute Care Choice: Skilled Nursing Facility Living arrangements for the past 2 months: Single Family Home                                       Social Drivers of Health (SDOH) Interventions SDOH Screenings   Food Insecurity: No Food Insecurity (08/08/2024)  Housing: Low Risk  (08/08/2024)  Transportation Needs: No Transportation Needs (08/08/2024)  Utilities: Not At Risk (08/08/2024)  Alcohol Screen: Low Risk  (05/30/2024)  Depression (PHQ2-9): Low Risk  (07/19/2024)  Financial Resource Strain: Low Risk  (05/30/2024)  Physical Activity: Inactive (05/30/2024)  Social Connections: Unknown (08/08/2024)  Stress: No Stress Concern Present (05/30/2024)  Tobacco Use: Low Risk  (08/08/2024)  Health Literacy: Adequate Health Literacy (05/30/2024)    Readmission Risk Interventions     No data to display

## 2024-08-15 ENCOUNTER — Ambulatory Visit

## 2024-08-15 ENCOUNTER — Other Ambulatory Visit: Payer: Self-pay | Admitting: *Deleted

## 2024-08-15 ENCOUNTER — Telehealth: Payer: Self-pay | Admitting: Internal Medicine

## 2024-08-15 ENCOUNTER — Ambulatory Visit: Admitting: Internal Medicine

## 2024-08-15 ENCOUNTER — Other Ambulatory Visit

## 2024-08-15 DIAGNOSIS — D46Z Other myelodysplastic syndromes: Secondary | ICD-10-CM

## 2024-08-15 NOTE — Telephone Encounter (Signed)
 Patient is scheduled for tomorrow. She just got out of the hospital and is asking if it will be ok to push out appointment a few weeks?  Please advise.

## 2024-08-15 NOTE — Assessment & Plan Note (Signed)
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 MARCH 2023-bone marrow biopsy no evidence of obvious progression of MDS or any acute process.  Currently on  Luspatercept .   # proceed with Luspatercept  # 34  today.  Continueh q 4 weeks given the stability/Hemoglobin- is reasonable;- stable.    # adrenal insuffiencey- [NOv 2025; [place] ]sent in refill for hydrocortisone-    # Leukocytosis-predominant neutrophilia immature cells noted- peripheral blood flow cytometry APRIL 2025- No significant immunophenotypic abnormality detected Neutrophilia with left-shifted maturation and monocytosis- stable. [? Related recent steroid I ]; recently been on prednisone - will repeat peripheral flowcytometry  # vit D def- FEB 2024-Vitamin D - 25/low-  on Vit D 50-continue vitamin D  weekly. OCT 2024-  Vit D-34-recommend every other week. 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  ? Etiology- poor tolerance to cymablta. S/p evaluation with Dr.Vaslow.  currently waiting on gabapentin  [Dr.Patel]- defer to neurology.  stable   # CKD- stage III- 50s- monitor for now-stable  # Vaccination: s/p flu shot. Consider/recommend COVID/ RSV- stable  PS # DISPOSITION:  # today Luspatecept  # follow up in 3  weeks--MD-  labs CBC/cmp;  LDH; Luspatercept  SQ-Dr.B

## 2024-08-15 NOTE — Progress Notes (Unsigned)
 [facility] CONSULT NOTE  Patient Care Team: [name], [name], MD as PCP - General Rennie, Cindy SAUNDERS, MD as Consulting Physician (Oncology) Pa, [facility] (Optometry)  CHIEF COMPLAINTS/PURPOSE OF CONSULTATION: MDS   Oncology History Overview Note   # July 2020-myelodysplastic syndrome with ringed sideroblasts-Normal karyotype; no blasts [IPSS R-Very low risk ~median survival 8.3 years]; iron studies B12 folic acid  myeloma panel normal; pyridoxine levels/copper /zinc -WNL. Erythropoietin  levels-60. II OPINION at [facility] [Dr.DeCastro]  # [facility]/ NGS: TET2([id number])c.2524delT(p.Ser842GlnfsTer31) Exon 3 frame-shift SF3B1([id number])c.2098A>G(p.Lys700Glu) Exon 15 missense  DNMT3A(NM_[id number])c.2204A>G(p.Tyr735Cys) Exon 19 missense   # JAN 11th 2020- Aranesp /retacrit;  # July 30th, 2021- START REVLIMID  10 mg/day  [SF3B67mutation]  MARCH 2023- BONE MARROW, ASPIRATE, CLOT, CORE:  -  Hypercellular marrow involved by myelodysplastic syndrome with ring  sideroblasts (MDS-RS)  -  Polytypic plasmacytosis   # MARCh 2023-increase the frequency of Aranesp  400 mcg every 2 weeks; continue Revlimid .   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  Ambulating independently.  Carla [name] 73 y.o.  female history of low-grade MDS anemia with ring sideroblasts  currently on Luspatercept  is here for follow-up.    C/o back/leg pain, seeing Beverley Economy, pain 7/10. She had 2 injections, one for pain and one for inflammation.  S/p recent prednisone .   Patient continues to have occasional SOB; otherwise no worsening cough or leg swelling. Appetite is fairly good. Still taking Calcium+D with the prescription Vitamin D .   Review of Systems  Constitutional:  Positive for malaise/fatigue. Negative for chills, diaphoresis and fever.  HENT:  Negative for nosebleeds and sore throat.   Eyes:  Negative for double vision.  Respiratory:  Negative for cough, hemoptysis, sputum production, shortness of breath and wheezing.   Cardiovascular:  Negative for chest pain and orthopnea.  Gastrointestinal:  Negative for abdominal pain, blood in stool, constipation, diarrhea, heartburn, melena and vomiting.  Genitourinary:  Negative for dysuria, frequency and urgency.  Musculoskeletal:  Positive for back pain and joint pain.  Skin: Negative.  Negative for itching and rash.  Neurological:  Negative for dizziness, tingling, focal weakness, weakness and headaches.  Endo/Heme/Allergies:  Does not bruise/bleed easily.  Psychiatric/Behavioral:  Negative for depression. The patient is not nervous/anxious and does not have insomnia.     MEDICAL HISTORY:  Past Medical History:  Diagnosis Date  . Allergic rhinitis, cause unspecified   . Anemia, unspecified   . Arthritis   . Cancer (HCC)    myelodysplastic syndrome  . Carpal tunnel syndrome   . Cataract 2007/2010  . Cystitis, unspecified   . Family history of osteoporosis   . Hypothyroidism   . PONV (postoperative nausea and vomiting)   . Postnasal drip   . Syncope and collapse     SURGICAL HISTORY: Past Surgical History:  Procedure Laterality Date  . BACK SURGERY    . BONE MARROW  BIOPSY    . CARPAL TUNNEL RELEASE Bilateral   . COLONOSCOPY  09/28/2003  . COLONOSCOPY WITH PROPOFOL  N/A 07/31/2015   Procedure:  COLONOSCOPY WITH PROPOFOL ;  Surgeon: Lamar Bunk, MD;  Location: [facility];  Service: Endoscopy;  Laterality: N/A;  . DIAGNOSTIC LAPAROSCOPY     dx lack of pregnancy   . EYE SURGERY     lasik, cataract, prk,yag procedure  . REVERSE SHOULDER ARTHROPLASTY Left 03/21/2024   Procedure: ARTHROPLASTY, SHOULDER, TOTAL, REVERSE FOR FRACTURE;  Surgeon: Cristy Bonner DASEN, MD;  Location: [facility];  Service: Orthopedics;  Laterality: Left;  . TONSILLECTOMY  1958    SOCIAL HISTORY: Social History   Socioeconomic History  . Marital status: Married    Spouse name: Not on file  . Number of children: Not on file  . Years of education: Not on file  . Highest education level: Not on file  Occupational History  . Not on file  Tobacco Use  . Smoking status: Never  . Smokeless tobacco: Never  Vaping Use  . Vaping status: Never Used  Substance and Sexual Activity  . Alcohol use: Not Currently    Comment: Rare  . Drug use: No  . Sexual activity: Not Currently  Other Topics Concern  . Not on file  Social History Narrative   No regular exercise      Drinks lots of [employer]         Social Drivers of Health   Financial Resource Strain: Low Risk  (05/30/2024)   Overall Financial Resource Strain (CARDIA)   . Difficulty of Paying Living Expenses: Not hard at all  Food Insecurity: No Food Insecurity (08/08/2024)   Hunger Vital Sign   . Worried About Programme Researcher, Broadcasting/film/video in the Last Year: Never true   . Ran Out of Food in the Last Year: Never true  Transportation Needs: No Transportation Needs (08/08/2024)   PRAPARE - Transportation   . Lack of Transportation (Medical): No   . Lack of Transportation (Non-Medical): No  Physical Activity: Inactive (05/30/2024)   Exercise Vital Sign   . Days of Exercise per Week: 0 days   . Minutes of Exercise per Session: 0 min  Stress: No Stress Concern Present (05/30/2024)   [employer] of Occupational Health - Occupational Stress Questionnaire   . Feeling of  Stress: Not at all  Social Connections: Unknown (08/08/2024)   Social Connection and Isolation Panel   . Frequency of Communication with Friends and Family: Patient unable to answer   . Frequency of Social Gatherings with Friends and Family: Patient unable to answer   . Attends Religious Services: Patient unable to answer   . Active Member of Clubs or Organizations: Patient unable to answer   . Attends Banker Meetings: Patient unable to answer   . Marital Status: Married  Catering Manager Violence: Patient Unable To Answer (08/08/2024)   Humiliation, Afraid, Rape, and Kick questionnaire   . Fear of Current or Ex-Partner: Patient unable to answer   . Emotionally Abused: Patient unable to answer   . Physically Abused: Patient unable to answer   . Sexually Abused: Patient unable to answer    FAMILY HISTORY: Family History  Problem Relation Age of Onset  . Osteoporosis Mother   . Stroke Mother   . Arthritis Mother   . Coronary artery disease Father   . Stroke Father 31  . Arthritis Father   . Heart disease Father   . Diabetes Other  Aunts and uncles  . Breast cancer Paternal Aunt   . Breast cancer Maternal Aunt   . Arthritis/Rheumatoid Child   . Breast cancer Cousin   . Cancer Maternal Aunt   . Cancer Maternal Aunt   . Cancer Paternal Aunt   . Diabetes Paternal Aunt   . Diabetes Sister   . Diabetes Paternal Uncle   . Heart disease Paternal Uncle   . Stroke Paternal Uncle   . Diabetes Paternal Aunt   . Stroke Paternal Aunt     ALLERGIES:  is allergic to codeine.  MEDICATIONS:  Current Outpatient Medications  Medication Sig Dispense Refill  . acetaminophen  (TYLENOL ) 500 MG tablet Take 2 tablets (1,000 mg total) by mouth 3 (three) times daily.    . bisacodyl  (DULCOLAX) 5 MG EC tablet Take 1 tablet (5 mg total) by mouth daily as needed for moderate constipation.    . calcium -vitamin D  (OSCAL WITH D) 500-5 MG-MCG tablet Take 1 tablet by mouth daily with  breakfast.    . cetirizine (ZYRTEC) 10 MG tablet Take 10 mg by mouth daily.    . Darbepoetin Alfa  (ARANESP ) 500 MCG/ML SOSY injection Inject 500 mcg into the skin See admin instructions. Inject 500 mcg into the skin every week as needed    . gabapentin  (NEURONTIN ) 300 MG capsule Take 300 mg by mouth 3 (three) times daily.    . hydrocortisone  (CORTEF ) 10 MG tablet Take 1 tablet (10 mg total) by mouth 2 (two) times daily.    . levothyroxine  (SYNTHROID ) 25 MCG tablet TAKE ONE TAB BY MOUTH ONCE DAILY. TAKE ON AN EMPTY STOMACH WITH A GLASS OF WATER ATLEAST 30-60 MINUTES BEFORE BREAKFAST 90 tablet 3  . loperamide  (IMODIUM ) 2 MG capsule Take 1 capsule (2 mg total) by mouth every 6 (six) hours as needed for diarrhea or loose stools.    . luspatercept -aamt (REBLOZYL ) 75 MG subcutaneous injection Inject into the skin every 28 (twenty-eight) days.    . methocarbamol  (ROBAXIN ) 500 MG tablet Take 1 tablet (500 mg total) by mouth 3 (three) times daily. (Patient taking differently: Take 500 mg by mouth every 8 (eight) hours as needed for muscle spasms.) 50 tablet 1  . omeprazole  (PRILOSEC) 20 MG capsule Take 1 capsule (20 mg total) by mouth daily. 90 capsule 2  . oxyCODONE  (OXY IR/ROXICODONE ) 5 MG immediate release tablet Take 1 tablet (5 mg total) by mouth every 6 (six) hours as needed for moderate pain (pain score 4-6). 20 tablet 0  . polyethylene glycol (MIRALAX  / GLYCOLAX ) 17 g packet Take 17 g by mouth daily as needed for mild constipation.    . promethazine  (PHENERGAN ) 25 MG tablet Take 1 tablet (25 mg total) by mouth every 6 (six) hours as needed for nausea or vomiting. (Patient taking differently: Take 12.5 mg by mouth every 6 (six) hours.) 10 tablet 1  . senna-docusate (SENOKOT-S) 8.6-50 MG tablet Take 1 tablet by mouth at bedtime.     No current facility-administered medications for this visit.   Facility-Administered Medications Ordered in Other Visits  Medication Dose Route Frequency Provider Last  Rate Last Admin  . heparin  lock flush 100 unit/mL  500 Units Intravenous Once Ty Oshima R, MD      . sodium chloride  flush (NS) 0.9 % injection 10 mL  10 mL Intravenous PRN Khila Papp R, MD          PHYSICAL EXAMINATION:   There were no vitals filed for this visit.  There were no vitals filed for this visit.         Physical Exam HENT:     Head: Normocephalic and atraumatic.     Mouth/Throat:     Pharynx: No oropharyngeal exudate.  Eyes:     Pupils: Pupils are equal, round, and reactive to light.  Cardiovascular:     Rate and Rhythm: Normal rate and regular rhythm.     Pulses: Normal pulses.     Heart sounds: Normal heart sounds.  Pulmonary:     Effort: Pulmonary effort is normal. No respiratory distress.     Breath sounds: Normal breath sounds. No wheezing.  Abdominal:     General: Bowel sounds are normal. There is no distension.     Palpations: Abdomen is soft. There is no mass.     Tenderness: There is no abdominal tenderness. There is no guarding or rebound.  Musculoskeletal:        General: No tenderness. Normal range of motion.     Cervical back: Normal range of motion and neck supple.  Skin:    General: Skin is warm.  Neurological:     Mental Status: She is alert and oriented to person, place, and time.  Psychiatric:        Mood and Affect: Affect normal.     LABORATORY DATA:  I have reviewed the data as listed Lab Results  Component Value Date   WBC 35.8 (H) 08/14/2024   HGB 7.9 (L) 08/14/2024   HCT 27.1 (L) 08/14/2024   MCV 103.8 (H) 08/14/2024   PLT 185 08/14/2024   Recent Labs    07/19/24 1102 07/19/24 1102 08/08/24 0840 08/09/24 0541 08/10/24 0451 08/11/24 0934 08/14/24 0550  NA 139  --  135 132* 137 135 139  K 4.5  --  4.1 4.4 3.8 3.5 4.3  CL 110  --  99 98 100 101 108  CO2 21*  --  23 23 27 23 23   GLUCOSE 110*  --  124* 123* 101* 146* 75  BUN 26*  --  13 11 15 14 12   CREATININE 1.13*   < > 1.09*  0.75 0.79 0.83 0.69  CALCIUM 9.3  --  8.4* 8.7* 9.1 8.0* 8.5*  GFRNONAA 51*   < > 53* >60 >60 >60 >60  PROT 8.5*  --  7.5 6.8  --   --   --   ALBUMIN 4.3  --  3.4* 2.7*  --   --   --   AST 21  --  43* 35  --   --   --   ALT 17  --  18 18  --   --   --   ALKPHOS 61  --  39 34*  --   --   --   BILITOT 0.9  --  1.2 0.7  --   --   --    < > = values in this interval not displayed.     CT Angio Chest PE W and/or Wo Contrast Result Date: 08/09/2024 CLINICAL DATA:  Shortness of breath EXAM: CT ANGIOGRAPHY CHEST WITH CONTRAST TECHNIQUE: Multidetector CT imaging of the chest was performed using the standard protocol during bolus administration of intravenous contrast. Multiplanar CT image reconstructions and MIPs were obtained to evaluate the vascular anatomy. RADIATION DOSE REDUCTION: This exam was performed according to the departmental dose-optimization program which includes automated exposure control, adjustment of the mA and/or kV according to patient size and/or use of iterative reconstruction  technique. CONTRAST:  75mL OMNIPAQUE  IOHEXOL  350 MG/ML SOLN COMPARISON:  Chest x-ray from the previous day. FINDINGS: Cardiovascular: Thoracic aorta shows mild atherosclerotic calcifications. No aneurysmal dilatation or dissection is noted. Pulmonary artery shows a normal branching pattern. No filling defect to suggest pulmonary embolism is seen. Mild coronary calcifications are noted. Mediastinum/Nodes: Thoracic inlet is within normal limits. No hilar or mediastinal adenopathy is noted. The esophagus as visualized is within normal limits. Lungs/Pleura: Mild left basilar atelectasis is seen. More marked consolidation is noted in the right lower lobe. No sizable effusion is seen. No parenchymal nodules are noted. Upper Abdomen: Visualized upper abdomen shows no acute abnormality. Musculoskeletal: Bony structures show postsurgical change in the left shoulder. No acute rib abnormality is noted. Review of the MIP  images confirms the above findings. IMPRESSION: No evidence of pulmonary embolism. Bibasilar changes worst on the right with more marked consolidation identified. Aortic Atherosclerosis (ICD10-I70.0). Electronically Signed   By: Oneil Devonshire M.D.   On: 08/09/2024 02:05   DG Hand 2 View Left Result Date: 08/08/2024 EXAM: 1 or 2 VIEW(S) XRAY OF THE LEFT HAND 08/08/2024 06:37:11 PM COMPARISON: None available. CLINICAL HISTORY: swelling. FINDINGS: BONES AND JOINTS: Scattered degenerative arthritis greatest at the 1st [facility] joint. No acute fracture. No focal osseous lesion. No joint dislocation. SOFT TISSUES: Mild swelling about the hand and wrist. IMPRESSION: 1. No acute osseous abnormality. 2. Mild swelling about the hand and wrist. Electronically signed by: Norman Gatlin MD 08/08/2024 06:41 PM EST RP Workstation: [id number]   DG Chest Port 1 View if patient is in a treatment room. Result Date: 08/08/2024 EXAM: 1 VIEW(S) XRAY OF THE CHEST 08/08/2024 08:56:00 AM COMPARISON: 05/17/2020 status post left shoulder arthroplasty. CLINICAL HISTORY: Suspected Sepsis. FINDINGS: LUNGS AND PLEURA: Elevated right hemidiaphragm. Minimal right basilar opacity is noted concerning for possible pneumonia or atelectasis. No pulmonary edema. No pleural effusion. No pneumothorax. HEART AND MEDIASTINUM: No acute abnormality of the cardiac and mediastinal silhouettes. BONES AND SOFT TISSUES: Status post left shoulder arthroplasty. No acute osseous abnormality. IMPRESSION: 1. Minimal right basilar opacity, compatible with atelectasis or pneumonia. Electronically signed by: Lynwood Seip MD 08/08/2024 09:12 AM EST RP Workstation: [id number]   DG Lumbar Spine 2-3 Views Result Date: 08/06/2024 CLINICAL DATA:  L4-5 fusion EXAM: LUMBAR SPINE - 2-3 VIEW COMPARISON:  MRI 05/15/2024 FINDINGS: The lowest lumbar type non-rib-bearing vertebra is labeled as L5. Tissue spreaders in place posteriorly at L4-5 with an instrument projecting over the  L4-5 intervertebral disc space. IMPRESSION: 1. Instrument projects over the L4-5 intervertebral disc space. Electronically Signed   By: Ryan Salvage M.D.   On: 08/06/2024 19:16   DG Lumbar Spine 1 View Result Date: 08/06/2024 CLINICAL DATA:  Intraoperative images for localization EXAM: LUMBAR SPINE - 1 VIEW COMPARISON:  05/15/2024 MRI FINDINGS: The lowest lumbar type non-rib-bearing vertebra is labeled as L5. Tissue spreaders are in place with a blunt tip slightly curved probe projecting between the L4 and L5 spinous processes in oriented towards the back of the L5 vertebral body. IMPRESSION: 1. Intraoperative localization with probe tip oriented towards the back of the L5 vertebral body. Electronically Signed   By: Ryan Salvage M.D.   On: 08/06/2024 19:15   DG C-Arm 1-60 Min-No Report Result Date: 08/06/2024 Fluoroscopy was utilized by the requesting physician.  No radiographic interpretation.   DG C-Arm 1-60 Min-No Report Result Date: 08/06/2024 Fluoroscopy was utilized by the requesting physician.  No radiographic interpretation.      MDS (myelodysplastic syndrome), low  grade (HCC)  # Low grade MDS- with ringed sideroblasts; no blasts. POSITIVE  For SF3B1; R-IPSS-low risk. MARCH 2023-bone marrow biopsy no evidence of obvious progression of MDS or any acute process.  Currently on  Luspatercept .   # proceed with Luspatercept  # 33  today.  Continueh q 4 weeks given the stability/Hemoglobin- is reasonable;- stable.    # Leukocytosis-predominant neutrophilia immature cells noted- peripheral blood flow cytometry APRIL 2025- No significant immunophenotypic abnormality detected Neutrophilia with left-shifted maturation and monocytosis- stable. [? Related recent steroid injection]; recently been on prednisone - will repeat peripheral flowcytometry  # vit D def- FEB 2024-Vitamin D - 25/low-  on Vit D 50-continue vitamin D  weekly. OCT 2024-  Vit D-34-recommend every other week. stable   #  Peripheral neuropathy-M-proetin- 2020- NEG.  ? Etiology- poor tolerance to cymablta. S/p evaluation with Dr.Vaslow.  currently waiting on gabapentin  [Dr.Patel]- defer to neurology.  stable   # [facility]- - s/p MRI for back [[place]]- sec to arthritis-s/p steroid injection- defer to ortho  # CKD- stage III- 50s- monitor for now-stable  # Vaccination: s/p flu shot. Consider/recommend COVID/ RSV- stable  PS # DISPOSITION:  # today Luspatecept  # follow up in 4 weeks--MD-  labs CBC/cmp;  LDH; Luspatercept  SQ- #  follow up in 8  weeks--MD-  labs CBC/cmp;   LDH; Luspatercept  SQ-Dr.B       All questions were answered. The patient knows to call the clinic with any problems, questions or concerns.   Cindy JONELLE Joe, MD 08/15/2024 9:49 PM

## 2024-08-16 ENCOUNTER — Inpatient Hospital Stay (HOSPITAL_BASED_OUTPATIENT_CLINIC_OR_DEPARTMENT_OTHER): Admitting: Internal Medicine

## 2024-08-16 ENCOUNTER — Inpatient Hospital Stay

## 2024-08-16 ENCOUNTER — Encounter: Payer: Self-pay | Admitting: Internal Medicine

## 2024-08-16 ENCOUNTER — Telehealth: Payer: Self-pay

## 2024-08-16 ENCOUNTER — Inpatient Hospital Stay: Attending: Internal Medicine

## 2024-08-16 VITALS — BP 95/69 | HR 84 | Temp 97.6°F | Resp 16 | Ht 63.0 in | Wt 156.2 lb

## 2024-08-16 DIAGNOSIS — D461 Refractory anemia with ring sideroblasts: Secondary | ICD-10-CM | POA: Insufficient documentation

## 2024-08-16 DIAGNOSIS — D46Z Other myelodysplastic syndromes: Secondary | ICD-10-CM

## 2024-08-16 LAB — CBC WITH DIFFERENTIAL/PLATELET
Abs Immature Granulocytes: 4.87 K/uL — ABNORMAL HIGH (ref 0.00–0.07)
Basophils Absolute: 0.2 K/uL — ABNORMAL HIGH (ref 0.0–0.1)
Basophils Relative: 1 %
Eosinophils Absolute: 0.1 K/uL (ref 0.0–0.5)
Eosinophils Relative: 0 %
HCT: 26.3 % — ABNORMAL LOW (ref 36.0–46.0)
Hemoglobin: 8 g/dL — ABNORMAL LOW (ref 12.0–15.0)
Immature Granulocytes: 18 %
Lymphocytes Relative: 9 %
Lymphs Abs: 2.5 K/uL (ref 0.7–4.0)
MCH: 30.2 pg (ref 26.0–34.0)
MCHC: 30.4 g/dL (ref 30.0–36.0)
MCV: 99.2 fL (ref 80.0–100.0)
Monocytes Absolute: 3.2 K/uL — ABNORMAL HIGH (ref 0.1–1.0)
Monocytes Relative: 12 %
Neutro Abs: 16.6 K/uL — ABNORMAL HIGH (ref 1.7–7.7)
Neutrophils Relative %: 60 %
Platelets: 207 K/uL (ref 150–400)
RBC: 2.65 MIL/uL — ABNORMAL LOW (ref 3.87–5.11)
RDW: 30.2 % — ABNORMAL HIGH (ref 11.5–15.5)
WBC: 27.5 K/uL — ABNORMAL HIGH (ref 4.0–10.5)
nRBC: 1 % — ABNORMAL HIGH (ref 0.0–0.2)

## 2024-08-16 LAB — CMP (CANCER CENTER ONLY)
ALT: 23 U/L (ref 0–44)
AST: 23 U/L (ref 15–41)
Albumin: 3.5 g/dL (ref 3.5–5.0)
Alkaline Phosphatase: 53 U/L (ref 38–126)
Anion gap: 8 (ref 5–15)
BUN: 20 mg/dL (ref 8–23)
CO2: 22 mmol/L (ref 22–32)
Calcium: 8.6 mg/dL — ABNORMAL LOW (ref 8.9–10.3)
Chloride: 106 mmol/L (ref 98–111)
Creatinine: 0.99 mg/dL (ref 0.44–1.00)
GFR, Estimated: 60 mL/min — ABNORMAL LOW (ref >60)
Glucose, Bld: 92 mg/dL (ref 70–99)
Potassium: 3.9 mmol/L (ref 3.5–5.1)
Sodium: 136 mmol/L (ref 135–145)
Total Bilirubin: 0.7 mg/dL (ref 0.0–1.2)
Total Protein: 7.9 g/dL (ref 6.5–8.1)

## 2024-08-16 LAB — SAMPLE TO BLOOD BANK

## 2024-08-16 LAB — LACTATE DEHYDROGENASE: LDH: 228 U/L (ref 105–235)

## 2024-08-16 MED ORDER — HYDROCORTISONE 10 MG PO TABS: ORAL_TABLET | ORAL | 1 refills | Status: AC

## 2024-08-16 MED ORDER — LUSPATERCEPT-AAMT 75 MG ~~LOC~~ SOLR
75.0000 mg | Freq: Once | SUBCUTANEOUS | Status: AC
  Administered 2024-08-16: 75 mg via SUBCUTANEOUS
  Filled 2024-08-16: qty 1.5

## 2024-08-16 NOTE — Progress Notes (Signed)
 Back surgery 11/10/, allergic reaction to one of the meds 08/07/24.  Unsure of the med she had a reaction to. Had a terrible experience at [facility], no rest. Waited 40 minutes to get help to the bathroom. Kept trying to give her a stool softener while having loose stools.  Spouse asking if there is individual that can come out to the house instead of an agency to help out for a couple hours per day?  Needs refill of hydrocortisone, pended.

## 2024-08-16 NOTE — Transitions of Care (Post Inpatient/ED Visit) (Signed)
   08/16/2024  Name: Carla [name] MRN: [id number] DOB: [birth year]  Today's TOC FU Call Status: Today's TOC FU Call Status:: Unsuccessful Call (1st Attempt) Unsuccessful Call (1st Attempt) Date: 08/16/24  Attempted to reach the patient regarding the most recent Inpatient/ED visit.  Follow Up Plan: Additional outreach attempts will be made to reach the patient to complete the Transitions of Care (Post Inpatient/ED visit) call.   Signature [name], LPN [facility] Nurse Health Advisor Direct Dial [phone]

## 2024-08-17 DIAGNOSIS — N179 Acute kidney failure, unspecified: Secondary | ICD-10-CM | POA: Diagnosis not present

## 2024-08-17 DIAGNOSIS — E039 Hypothyroidism, unspecified: Secondary | ICD-10-CM | POA: Diagnosis not present

## 2024-08-17 DIAGNOSIS — D72829 Elevated white blood cell count, unspecified: Secondary | ICD-10-CM | POA: Diagnosis not present

## 2024-08-17 DIAGNOSIS — D469 Myelodysplastic syndrome, unspecified: Secondary | ICD-10-CM | POA: Diagnosis not present

## 2024-08-17 DIAGNOSIS — R7303 Prediabetes: Secondary | ICD-10-CM | POA: Diagnosis not present

## 2024-08-17 DIAGNOSIS — M4726 Other spondylosis with radiculopathy, lumbar region: Secondary | ICD-10-CM | POA: Diagnosis not present

## 2024-08-17 DIAGNOSIS — T380X5D Adverse effect of glucocorticoids and synthetic analogues, subsequent encounter: Secondary | ICD-10-CM | POA: Diagnosis not present

## 2024-08-17 DIAGNOSIS — Z4789 Encounter for other orthopedic aftercare: Secondary | ICD-10-CM | POA: Diagnosis not present

## 2024-08-17 DIAGNOSIS — N1831 Chronic kidney disease, stage 3a: Secondary | ICD-10-CM | POA: Diagnosis not present

## 2024-08-17 DIAGNOSIS — D630 Anemia in neoplastic disease: Secondary | ICD-10-CM | POA: Diagnosis not present

## 2024-08-17 DIAGNOSIS — Z981 Arthrodesis status: Secondary | ICD-10-CM | POA: Diagnosis not present

## 2024-08-17 DIAGNOSIS — Z79891 Long term (current) use of opiate analgesic: Secondary | ICD-10-CM | POA: Diagnosis not present

## 2024-08-17 DIAGNOSIS — J309 Allergic rhinitis, unspecified: Secondary | ICD-10-CM | POA: Diagnosis not present

## 2024-08-17 DIAGNOSIS — M4316 Spondylolisthesis, lumbar region: Secondary | ICD-10-CM | POA: Diagnosis not present

## 2024-08-17 DIAGNOSIS — D631 Anemia in chronic kidney disease: Secondary | ICD-10-CM | POA: Diagnosis not present

## 2024-08-17 DIAGNOSIS — Z96612 Presence of left artificial shoulder joint: Secondary | ICD-10-CM | POA: Diagnosis not present

## 2024-08-17 DIAGNOSIS — Z7952 Long term (current) use of systemic steroids: Secondary | ICD-10-CM | POA: Diagnosis not present

## 2024-08-17 DIAGNOSIS — Z79890 Hormone replacement therapy (postmenopausal): Secondary | ICD-10-CM | POA: Diagnosis not present

## 2024-08-17 DIAGNOSIS — K219 Gastro-esophageal reflux disease without esophagitis: Secondary | ICD-10-CM | POA: Diagnosis not present

## 2024-08-17 DIAGNOSIS — Z87440 Personal history of urinary (tract) infections: Secondary | ICD-10-CM | POA: Diagnosis not present

## 2024-08-17 DIAGNOSIS — Z9181 History of falling: Secondary | ICD-10-CM | POA: Diagnosis not present

## 2024-08-17 DIAGNOSIS — M5116 Intervertebral disc disorders with radiculopathy, lumbar region: Secondary | ICD-10-CM | POA: Diagnosis not present

## 2024-08-17 NOTE — Transitions of Care (Post Inpatient/ED Visit) (Signed)
 08/17/2024  Name: Carla [name] MRN: [id number] DOB: 13-May-1951  Today's TOC FU Call Status: Today's TOC FU Call Status:: Successful TOC FU Call Completed Unsuccessful Call (1st Attempt) Date: 08/16/24 [facility] FU Call Complete Date: 08/17/24  Patient's Name and Date of Birth confirmed. Name, DOB  Transition Care Management Follow-up Telephone Call Date of Discharge: 08/16/24 Discharge Facility: [name] Type of Discharge: Inpatient Admission Primary Inpatient Discharge Diagnosis:: pneumonia How have you been since you were released from the hospital?: Better Any questions or concerns?: No  Items Reviewed: Did you receive and understand the discharge instructions provided?: Yes Medications obtained,verified, and reconciled?: Yes (Medications Reviewed) Any new allergies since your discharge?: No Dietary orders reviewed?: Yes Do you have support at home?: Yes People in Home [RPT]: spouse  Medications Reviewed Today: Medications Reviewed Today     Reviewed by Emmitt Pan, LPN (Licensed Practical Nurse) on 08/17/24 at 1102  Med List Status: <None>   Medication Order Taking? Sig Documenting Provider Last Dose Status Informant  acetaminophen  (TYLENOL ) 500 MG tablet [id number] Yes Take 2 tablets (1,000 mg total) by mouth 3 (three) times daily. Arlice Reichert, MD  Active   bisacodyl  (DULCOLAX) 5 MG EC tablet [id number] Yes Take 1 tablet (5 mg total) by mouth daily as needed for moderate constipation. Arlice Reichert, MD  Active   calcium -vitamin D  (OSCAL WITH D) 500-5 MG-MCG tablet [id number] Yes Take 1 tablet by mouth daily with breakfast. Arlice Reichert, MD  Active   cetirizine (ZYRTEC) 10 MG tablet [id number] Yes Take 10 mg by mouth daily. [provider]  Active Self, Spouse/Significant Other, Pharmacy Records  Darbepoetin Alfa  (ARANESP ) 500 MCG/ML SOSY injection [id number] Yes Inject 500 mcg into the skin See admin instructions. Inject 500 mcg into the skin every week as  needed [provider]  Active Self, Spouse/Significant Other, Pharmacy Records  gabapentin  (NEURONTIN ) 300 MG capsule [id number] Yes Take 300 mg by mouth 3 (three) times daily. [provider]  Active Self, Spouse/Significant Other, Pharmacy Records           Med Note (COFFELL, JON CHRISTELLA Kitchens Aug 06, 2024 11:52 AM) Recent fill history found for both 300mg  and 100mg  capsules. Patient confirmed she is only taking 300mg  capsules at this time.  heparin  lock flush 100 unit/mL [id number]   Brahmanday, Govinda R, MD  Active   hydrocortisone  (CORTEF ) 10 MG tablet [id number] Yes Take 1 pill in AM; and one pill around 2-3 pm. Brahmanday, Govinda R, MD  Active   levothyroxine  (SYNTHROID ) 25 MCG tablet [id number] Yes TAKE ONE TAB BY MOUTH ONCE DAILY. TAKE ON AN EMPTY STOMACH WITH A GLASS OF WATER ATLEAST 30-60 MINUTES BEFORE BREAKFAST [name], [name], MD  Active Self, Spouse/Significant Other, Pharmacy Records  loperamide  (IMODIUM ) 2 MG capsule [id number] Yes Take 1 capsule (2 mg total) by mouth every 6 (six) hours as needed for diarrhea or loose stools. Arlice Reichert, MD  Active   luspatercept -aamt (REBLOZYL ) 75 MG subcutaneous injection [id number] Yes Inject into the skin every 28 (twenty-eight) days. [provider]  Active Self, Spouse/Significant Other, Pharmacy Records  methocarbamol  (ROBAXIN ) 500 MG tablet [id number] Yes Take 1 tablet (500 mg total) by mouth 3 (three) times daily.  Patient taking differently: Take 500 mg by mouth every 8 (eight) hours as needed for muscle spasms.   Mavis Purchase, MD  Active Self, Spouse/Significant Other, Pharmacy Records  omeprazole  [facility]) 20 MG capsule [id number] Yes Take 1 capsule (20 mg total) by mouth daily.  [name], [name], MD  Active Self, Spouse/Significant Other, Pharmacy Records  oxyCODONE  (OXY IR/ROXICODONE ) 5 MG immediate release tablet [id number] Yes Take 1 tablet (5 mg total) by mouth every 6 (six) hours as needed for moderate pain (pain  score 4-6). Dahal, Binaya, MD  Active   polyethylene glycol (MIRALAX  / GLYCOLAX ) 17 g packet [id number] Yes Take 17 g by mouth daily as needed for mild constipation. Arlice Reichert, MD  Active   promethazine  (PHENERGAN ) 25 MG tablet [id number] Yes Take 1 tablet (25 mg total) by mouth every 6 (six) hours as needed for nausea or vomiting.  Patient taking differently: Take 12.5 mg by mouth every 6 (six) hours.   McBane, [name], [name]  Active Self, Spouse/Significant Other, Pharmacy Records  senna-docusate (SENOKOT-S) 8.6-50 MG tablet [id number] Yes Take 1 tablet by mouth at bedtime. Arlice Reichert, MD  Active   sodium chloride  flush (NS) 0.9 % injection 10 mL [id number]   Rennie Cindy SAUNDERS, MD  Active             Home Care and Equipment/Supplies: Were Home Health Services Ordered?: Yes Name of Home Health Agency:: [facility] Has Agency set up a time to come to your home?: Yes First Home Health Visit Date: 08/17/24 Any new equipment or medical supplies ordered?: NA  Functional Questionnaire: Do you need assistance with bathing/showering or dressing?: Yes Do you need assistance with meal preparation?: Yes Do you need assistance with eating?: No Do you have difficulty maintaining continence: No Do you need assistance with getting out of bed/getting out of a chair/moving?: Yes Do you have difficulty managing or taking your medications?: Yes  Follow up appointments reviewed: PCP Follow-up appointment confirmed?: No (declined) MD Provider Line Number:[id number] Given: No Specialist Hospital Follow-up appointment confirmed?: Yes Date of Specialist follow-up appointment?: 09/04/24 Follow-Up Specialty Provider:: ortho Do you need transportation to your follow-up appointment?: No Do you understand care options if your condition(s) worsen?: Yes-patient verbalized understanding    SIGNATURE [name], LPN [facility] Nurse Health Advisor Direct Dial [phone]

## 2024-08-20 ENCOUNTER — Other Ambulatory Visit: Payer: Self-pay | Admitting: Family Medicine

## 2024-08-20 NOTE — Telephone Encounter (Unsigned)
 Copied from CRM [id number]. Topic: Clinical - Medication Refill >> Aug 20, 2024 11:22 AM Tanazia G wrote: Medication:  promethazine  (PHENERGAN ) 25 MG tablet    Has the patient contacted their pharmacy? Yes (Agent: If no, request that the patient contact the pharmacy for the refill. If patient does not wish to contact the pharmacy document the reason why and proceed with request.) (Agent: If yes, when and what did the pharmacy advise?)  This is the patient's preferred pharmacy:  [id number] [place] - [address] [place] [address] Phone: [phone] Fax: [phone]  Is this the correct pharmacy for this prescription? Yes If no, delete pharmacy and type the correct one.   Has the prescription been filled recently? Yes  Is the patient out of the medication? Yes  Has the patient been seen for an appointment in the last year OR does the patient have an upcoming appointment? Yes  Can we respond through MyChart? Yes  Agent: Please be advised that Rx refills [place] take up to 3 business days. We ask that you follow-up with your pharmacy.

## 2024-08-21 LAB — COMP PANEL: LEUKEMIA/LYMPHOMA

## 2024-08-21 MED ORDER — PROMETHAZINE HCL 25 MG PO TABS: 12.5000 mg | ORAL_TABLET | Freq: Four times a day (QID) | ORAL | 1 refills | Status: AC

## 2024-08-21 NOTE — Telephone Encounter (Signed)
 Last filled on 03/21/24 #10 tab/ 1 refill  CPE was on 07/24/24

## 2024-08-22 ENCOUNTER — Other Ambulatory Visit (HOSPITAL_COMMUNITY): Payer: Self-pay

## 2024-09-04 ENCOUNTER — Encounter: Payer: Self-pay | Admitting: *Deleted

## 2024-09-04 DIAGNOSIS — M4316 Spondylolisthesis, lumbar region: Secondary | ICD-10-CM | POA: Diagnosis not present

## 2024-09-04 DIAGNOSIS — Z6826 Body mass index (BMI) 26.0-26.9, adult: Secondary | ICD-10-CM | POA: Diagnosis not present

## 2024-09-05 ENCOUNTER — Inpatient Hospital Stay: Attending: Internal Medicine

## 2024-09-05 ENCOUNTER — Inpatient Hospital Stay (HOSPITAL_BASED_OUTPATIENT_CLINIC_OR_DEPARTMENT_OTHER): Admitting: Internal Medicine

## 2024-09-05 ENCOUNTER — Inpatient Hospital Stay

## 2024-09-05 ENCOUNTER — Other Ambulatory Visit: Payer: Self-pay | Admitting: Internal Medicine

## 2024-09-05 ENCOUNTER — Encounter: Payer: Self-pay | Admitting: Internal Medicine

## 2024-09-05 VITALS — BP 126/68 | HR 83 | Temp 98.9°F | Resp 16 | Ht 63.0 in | Wt 155.4 lb

## 2024-09-05 DIAGNOSIS — M4854XA Collapsed vertebra, not elsewhere classified, thoracic region, initial encounter for fracture: Secondary | ICD-10-CM | POA: Insufficient documentation

## 2024-09-05 DIAGNOSIS — D46Z Other myelodysplastic syndromes: Secondary | ICD-10-CM

## 2024-09-05 DIAGNOSIS — D461 Refractory anemia with ring sideroblasts: Secondary | ICD-10-CM | POA: Insufficient documentation

## 2024-09-05 DIAGNOSIS — Z803 Family history of malignant neoplasm of breast: Secondary | ICD-10-CM | POA: Insufficient documentation

## 2024-09-05 LAB — LACTATE DEHYDROGENASE: LDH: 220 U/L (ref 105–235)

## 2024-09-05 LAB — CMP (CANCER CENTER ONLY)
ALT: 14 U/L (ref 0–44)
AST: 21 U/L (ref 15–41)
Albumin: 4.1 g/dL (ref 3.5–5.0)
Alkaline Phosphatase: 81 U/L (ref 38–126)
Anion gap: 14 (ref 5–15)
BUN: 15 mg/dL (ref 8–23)
CO2: 23 mmol/L (ref 22–32)
Calcium: 9.3 mg/dL (ref 8.9–10.3)
Chloride: 101 mmol/L (ref 98–111)
Creatinine: 0.89 mg/dL (ref 0.44–1.00)
GFR, Estimated: >60 mL/min (ref >60)
Glucose, Bld: 142 mg/dL — ABNORMAL HIGH (ref 70–99)
Potassium: 4.2 mmol/L (ref 3.5–5.1)
Sodium: 137 mmol/L (ref 135–145)
Total Bilirubin: 0.6 mg/dL (ref 0.0–1.2)
Total Protein: 8.2 g/dL — ABNORMAL HIGH (ref 6.5–8.1)

## 2024-09-05 LAB — CBC WITH DIFFERENTIAL/PLATELET
Abs Immature Granulocytes: 1.25 K/uL — ABNORMAL HIGH (ref 0.00–0.07)
Basophils Absolute: 0.1 K/uL (ref 0.0–0.1)
Basophils Relative: 1 %
Eosinophils Absolute: 0.1 K/uL (ref 0.0–0.5)
Eosinophils Relative: 1 %
HCT: 30.5 % — ABNORMAL LOW (ref 36.0–46.0)
Hemoglobin: 9.2 g/dL — ABNORMAL LOW (ref 12.0–15.0)
Immature Granulocytes: 7 %
Lymphocytes Relative: 13 %
Lymphs Abs: 2.2 K/uL (ref 0.7–4.0)
MCH: 30.5 pg (ref 26.0–34.0)
MCHC: 30.2 g/dL (ref 30.0–36.0)
MCV: 101 fL — ABNORMAL HIGH (ref 80.0–100.0)
Monocytes Absolute: 2.1 K/uL — ABNORMAL HIGH (ref 0.1–1.0)
Monocytes Relative: 12 %
Neutro Abs: 11.5 K/uL — ABNORMAL HIGH (ref 1.7–7.7)
Neutrophils Relative %: 66 %
Platelets: 245 K/uL (ref 150–400)
RBC: 3.02 MIL/uL — ABNORMAL LOW (ref 3.87–5.11)
RDW: 30.4 % — ABNORMAL HIGH (ref 11.5–15.5)
WBC: 17.4 K/uL — ABNORMAL HIGH (ref 4.0–10.5)
nRBC: 2.1 % — ABNORMAL HIGH (ref 0.0–0.2)

## 2024-09-05 MED ORDER — LUSPATERCEPT-AAMT 75 MG ~~LOC~~ SOLR
75.0000 mg | Freq: Once | SUBCUTANEOUS | Status: AC
  Administered 2024-09-05: 75 mg via SUBCUTANEOUS
  Filled 2024-09-05: qty 1.5

## 2024-09-05 NOTE — Assessment & Plan Note (Addendum)
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 MARCH 2023-bone marrow biopsy no evidence of obvious progression of MDS or any acute process.  Currently on  Luspatercept .   # proceed with Luspatercept  # 36  today.  Continueh q 4 weeks given the stability/Hemoglobin- is reasonable;- stable.    # adrenal insuffiencey- [NOv 2025; [place] ]sent in refill for hydrocortisone -patient will continue follow-up with PCP/endocrinology.  # Leukocytosis-predominant neutrophilia immature cells noted- peripheral blood flow cytometry APRIL 2025- No significant immunophenotypic abnormality detected Neutrophilia with left-shifted maturation and monocytosis- stable. [? Related recent steroid I]-improving monitor for now.  # vit D def- FEB 2024-Vitamin D - 25/low-  on Vit D 50-continue vitamin D  weekly. OCT 2024-  Vit D-34-recommend every other week. 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  ? Etiology- poor tolerance to cymablta. S/p evaluation with Dr.Vaslow.  currently waiting on gabapentin  [Dr.Patel]- defer to neurology.  stable   # CKD- stage III- 50s- monitor for now-stable  # Vaccination: s/p flu shot. Consider/recommend COVID/ RSV- stable  PS # DISPOSITION:  # today Luspatecept  # 3 weeks- labs- cbc/bmp; Luspatercept  # follow up in 6  weeks--MD-  abs CBC/cmp;  LDH; Luspatercept  SQ-Dr.B

## 2024-09-05 NOTE — Progress Notes (Signed)
 [facility] CONSULT NOTE  Patient Care Team: [name], [name], MD as PCP - General Rennie, Cindy SAUNDERS, MD as Consulting Physician (Oncology) Pa, [facility] (Optometry)  CHIEF COMPLAINTS/PURPOSE OF CONSULTATION: MDS   Oncology History Overview Note   # July 2020-myelodysplastic syndrome with ringed sideroblasts-Normal karyotype; no blasts [IPSS R-Very low risk ~median survival 8.3 years]; iron studies B12 folic acid  myeloma panel normal; pyridoxine levels/copper /zinc -WNL. Erythropoietin  levels-60. II OPINION at [facility] [Dr.DeCastro]  # [facility]/ NGS: TET2([id number])c.2524delT(p.Ser842GlnfsTer31) Exon 3 frame-shift SF3B1([id number])c.2098A>G(p.Lys700Glu) Exon 15 missense  DNMT3A(NM_[id number])c.2204A>G(p.Tyr735Cys) Exon 19 missense   # JAN 11th 2020- Aranesp /retacrit;  # July 30th, 2021- START REVLIMID  10 mg/day  [SF3B23mutation]  MARCH 2023- BONE MARROW, ASPIRATE, CLOT, CORE:  -  Hypercellular marrow involved by myelodysplastic syndrome with ring  sideroblasts (MDS-RS)  -  Polytypic plasmacytosis   # [facility] 2023-increase the frequency of Aranesp  400 mcg every 2 weeks; continue Revlimid .   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  Ambulating in wheel chair  Carla [name] 73 y.o.  female history of low-grade MDS anemia with ring sideroblasts  currently on Luspatercept  is here for follow-up.      History of Present Illness   Carla [name] is a 73 year old female with adrenal insufficiency and MDS is here for a follow up.  Discussed the use of AI scribe software for clinical note transcription with the patient, who gave verbal consent to proceed.  History of Present Illness   Carla [name] is a 73 year old female with myelodysplastic syndrome receiving luspatercept  who presents for routine hematology follow-up to assess disease status and treatment response.  She is undergoing ongoing treatment with luspatercept  for myelodysplastic syndrome and reports feeling well overall, with no new symptoms or complaints since her last visit. She denies adverse effects from therapy and has no new issues or concerns.  Recent laboratory evaluation shows hemoglobin increased from 8.0 to 9.2 g/dL, platelet count is now normal, and white blood cell count decreased from 30-40 x10^9/L (during prior hospitalization) to 17 x10^9/L. Results of kidney and liver function tests are pending.  She recently followed up with her back surgeon after a procedure and reports that she was told everything looked good. She notes persistent mild postoperative cognitive fogginess but denies ongoing symptoms or complications related to her back or recent surgery.       Review of Systems  Constitutional:  Positive for malaise/fatigue. Negative for chills, diaphoresis and fever.  HENT:  Negative for nosebleeds and sore throat.   Eyes:  Negative for double vision.  Respiratory:  Negative for cough, hemoptysis, sputum production, shortness of breath and wheezing.   Cardiovascular:  Negative for chest pain and orthopnea.  Gastrointestinal:  Negative for abdominal pain, blood in stool, constipation, diarrhea, heartburn, melena and vomiting.  Genitourinary:  Negative for dysuria, frequency and urgency.  Musculoskeletal:  Positive for back pain and joint pain.  Skin: Negative.   Negative for itching and rash.  Neurological:  Negative for dizziness, tingling, focal weakness, weakness and headaches.  Endo/Heme/Allergies:  Does not bruise/bleed easily.  Psychiatric/Behavioral:  Negative for depression. The patient is not nervous/anxious and does not have insomnia.     MEDICAL HISTORY:  Past Medical History:  Diagnosis Date   Allergic rhinitis, cause unspecified    Anemia, unspecified    Arthritis    Cancer (HCC)    myelodysplastic syndrome   Carpal tunnel syndrome    Cataract 2007/2010   Cystitis, unspecified    Family history of osteoporosis    Hypothyroidism    PONV (postoperative nausea and vomiting)    Postnasal drip    Syncope and collapse     SURGICAL HISTORY: Past Surgical History:  Procedure Laterality Date   BACK SURGERY     BONE MARROW BIOPSY     CARPAL TUNNEL RELEASE Bilateral    COLONOSCOPY  09/28/2003   COLONOSCOPY WITH PROPOFOL  N/A 07/31/2015   Procedure: COLONOSCOPY WITH PROPOFOL ;  Surgeon: Lamar Bunk, MD;  Location: [facility];  Service: Endoscopy;  Laterality: N/A;   DIAGNOSTIC LAPAROSCOPY     dx lack of pregnancy    EYE SURGERY     lasik, cataract, prk,yag procedure   REVERSE SHOULDER ARTHROPLASTY Left 03/21/2024   Procedure: ARTHROPLASTY, SHOULDER, TOTAL, REVERSE FOR FRACTURE;  Surgeon: Cristy Bonner DASEN, MD;  Location: [facility];  Service: Orthopedics;  Laterality: Left;   TONSILLECTOMY  1958    SOCIAL HISTORY: Social History   Socioeconomic History   Marital status: Married    Spouse name: Not on file   Number of children: Not on file   Years of education: Not on file   Highest education level: Not on file  Occupational History   Not on file  Tobacco Use   Smoking status: Never   Smokeless tobacco: Never  Vaping Use   Vaping status: Never Used  Substance and Sexual Activity   Alcohol use: Not Currently    Comment: Rare   Drug use: No   Sexual activity: Not Currently  Other Topics Concern   Not on file  Social  History Narrative   No regular exercise      Drinks lots of [employer]eps[employer]         Social Drivers of Health   Financial Resource Strain: Low Risk  (05/30/2024)   Overall Financial Resource Strain (CARDIA)    Difficulty of Paying Living Expenses: Not hard at all  Food Insecurity: No Food Insecurity (08/08/2024)   Hunger Vital Sign    Worried About Running Out of Food in the Last Year: Never true    Ran Out of Food in the Last Year: Never true  Transportation Needs: No Transportation Needs (08/08/2024)   PRAPARE - Administrator, Civil Service (Medical): No    Lack of Transportation (Non-Medical): No  Physical Activity: Inactive (05/30/2024)   Exercise Vital Sign    Days of Exercise per Week: 0 days    Minutes of Exercise per Session: 0 min  Stress: No Stress Concern Present (05/30/2024)   [employer] of Occupational Health - Occupational Stress Questionnaire    Feeling of Stress: Not at all  Social Connections: Unknown (08/08/2024)   Social Connection and Isolation Panel    Frequency of Communication with Friends and Family: Patient unable to answer    Frequency of Social Gatherings with Friends and Family: Patient  unable to answer    Attends Religious Services: Patient unable to answer    Active Member of Clubs or Organizations: Patient unable to answer    Attends Club or Organization Meetings: Patient unable to answer    Marital Status: Married  Intimate Partner Violence: Patient Unable To Answer (08/08/2024)   Humiliation, Afraid, Rape, and Kick questionnaire    Fear of Current or Ex-Partner: Patient unable to answer    Emotionally Abused: Patient unable to answer    Physically Abused: Patient unable to answer    Sexually Abused: Patient unable to answer    FAMILY HISTORY: Family History  Problem Relation Age of Onset   Osteoporosis Mother    Stroke Mother    Arthritis Mother    Coronary artery disease Father    Stroke Father 18   Arthritis Father    Heart  disease Father    Diabetes Other        Aunts and uncles   Breast cancer Paternal Aunt    Breast cancer Maternal Aunt    Arthritis/Rheumatoid Child    Breast cancer Cousin    Cancer Maternal Aunt    Cancer Maternal Aunt    Cancer Paternal Aunt    Diabetes Paternal Aunt    Diabetes Sister    Diabetes Paternal Uncle    Heart disease Paternal Uncle    Stroke Paternal Uncle    Diabetes Paternal Aunt    Stroke Paternal Aunt     ALLERGIES:  is allergic to codeine.  MEDICATIONS:  Current Outpatient Medications  Medication Sig Dispense Refill   acetaminophen  (TYLENOL ) 500 MG tablet Take 2 tablets (1,000 mg total) by mouth 3 (three) times daily.     bisacodyl  (DULCOLAX) 5 MG EC tablet Take 1 tablet (5 mg total) by mouth daily as needed for moderate constipation.     calcium -vitamin D  (OSCAL WITH D) 500-5 MG-MCG tablet Take 1 tablet by mouth daily with breakfast.     cetirizine (ZYRTEC) 10 MG tablet Take 10 mg by mouth daily.     Darbepoetin Alfa  (ARANESP ) 500 MCG/ML SOSY injection Inject 500 mcg into the skin See admin instructions. Inject 500 mcg into the skin every week as needed     gabapentin  (NEURONTIN ) 300 MG capsule Take 300 mg by mouth 3 (three) times daily.     hydrocortisone  (CORTEF ) 10 MG tablet Take 1 pill in AM; and one pill around 2-3 pm. 180 tablet 1   levothyroxine  (SYNTHROID ) 25 MCG tablet TAKE ONE TAB BY MOUTH ONCE DAILY. TAKE ON AN EMPTY STOMACH WITH A GLASS OF WATER ATLEAST 30-60 MINUTES BEFORE BREAKFAST 90 tablet 3   loperamide  (IMODIUM ) 2 MG capsule Take 1 capsule (2 mg total) by mouth every 6 (six) hours as needed for diarrhea or loose stools.     luspatercept -aamt (REBLOZYL ) 75 MG subcutaneous injection Inject into the skin every 28 (twenty-eight) days.     methocarbamol  (ROBAXIN ) 500 MG tablet Take 1 tablet (500 mg total) by mouth 3 (three) times daily. (Patient taking differently: Take 500 mg by mouth every 8 (eight) hours as needed for muscle spasms.) 50 tablet 1    omeprazole  (PRILOSEC) 20 MG capsule Take 1 capsule (20 mg total) by mouth daily. 90 capsule 2   oxyCODONE  (OXY IR/ROXICODONE ) 5 MG immediate release tablet Take 1 tablet (5 mg total) by mouth every 6 (six) hours as needed for moderate pain (pain score 4-6). 20 tablet 0   polyethylene glycol (MIRALAX  / GLYCOLAX ) 17 g packet  Take 17 g by mouth daily as needed for mild constipation.     promethazine  (PHENERGAN ) 25 MG tablet Take 0.5 tablets (12.5 mg total) by mouth every 6 (six) hours. 10 tablet 1   senna-docusate (SENOKOT-S) 8.6-50 MG tablet Take 1 tablet by mouth at bedtime.     No current facility-administered medications for this visit.   Facility-Administered Medications Ordered in Other Visits  Medication Dose Route Frequency Provider Last Rate Last Admin   heparin  lock flush 100 unit/mL  500 Units Intravenous Once Dannell Raczkowski R, MD       luspatercept -aamt (REBLOZYL ) subcutaneous injection 75 mg  75 mg Subcutaneous Once Mahima Hottle R, MD       sodium chloride  flush (NS) 0.9 % injection 10 mL  10 mL Intravenous PRN Chisom Aust R, MD          PHYSICAL EXAMINATION:   Vitals:   09/05/24 0834  BP: 126/68  Pulse: 83  Resp: 16  Temp: 98.9 F (37.2 C)  SpO2: 96%          Filed Weights   09/05/24 0834  Weight: 155 lb 6.4 oz (70.5 kg)           Physical Exam HENT:     Head: Normocephalic and atraumatic.     Mouth/Throat:     Pharynx: No oropharyngeal exudate.  Eyes:     Pupils: Pupils are equal, round, and reactive to light.  Cardiovascular:     Rate and Rhythm: Normal rate and regular rhythm.     Pulses: Normal pulses.     Heart sounds: Normal heart sounds.  Pulmonary:     Effort: Pulmonary effort is normal. No respiratory distress.     Breath sounds: Normal breath sounds. No wheezing.  Abdominal:     General: Bowel sounds are normal. There is no distension.     Palpations: Abdomen is soft. There is no mass.     Tenderness: There  is no abdominal tenderness. There is no guarding or rebound.  Musculoskeletal:        General: No tenderness. Normal range of motion.     Cervical back: Normal range of motion and neck supple.  Skin:    General: Skin is warm.  Neurological:     Mental Status: She is alert and oriented to person, place, and time.  Psychiatric:        Mood and Affect: Affect normal.     LABORATORY DATA:  I have reviewed the data as listed Lab Results  Component Value Date   WBC 17.4 (H) 09/05/2024   HGB 9.2 (L) 09/05/2024   HCT 30.5 (L) 09/05/2024   MCV 101.0 (H) 09/05/2024   PLT 245 09/05/2024   Recent Labs    08/09/24 0541 08/10/24 0451 08/14/24 0550 08/16/24 0951 09/05/24 0831  NA 132*   < > 139 136 137  K 4.4   < > 4.3 3.9 4.2  CL 98   < > 108 106 101  CO2 23   < > 23 22 23   GLUCOSE 123*   < > 75 92 142*  BUN 11   < > 12 20 15   CREATININE 0.75   < > 0.69 0.99 0.89  CALCIUM  8.7*   < > 8.5* 8.6* 9.3  GFRNONAA >60   < > >60 60* >60  PROT 6.8  --   --  7.9 8.2*  ALBUMIN 2.7*  --   --  3.5 4.1  AST 35  --   --  23 21  ALT 18  --   --  23 14  ALKPHOS 34*  --   --  53 81  BILITOT 0.7  --   --  0.7 0.6   < > = values in this interval not displayed.     CT Angio Chest PE W and/or Wo Contrast Result Date: 08/09/2024 CLINICAL DATA:  Shortness of breath EXAM: CT ANGIOGRAPHY CHEST WITH CONTRAST TECHNIQUE: Multidetector CT imaging of the chest was performed using the standard protocol during bolus administration of intravenous contrast. Multiplanar CT image reconstructions and MIPs were obtained to evaluate the vascular anatomy. RADIATION DOSE REDUCTION: This exam was performed according to the departmental dose-optimization program which includes automated exposure control, adjustment of the mA and/or kV according to patient size and/or use of iterative reconstruction technique. CONTRAST:  75mL OMNIPAQUE  IOHEXOL  350 MG/ML SOLN COMPARISON:  Chest x-ray from the previous day. FINDINGS:  Cardiovascular: Thoracic aorta shows mild atherosclerotic calcifications. No aneurysmal dilatation or dissection is noted. Pulmonary artery shows a normal branching pattern. No filling defect to suggest pulmonary embolism is seen. Mild coronary calcifications are noted. Mediastinum/Nodes: Thoracic inlet is within normal limits. No hilar or mediastinal adenopathy is noted. The esophagus as visualized is within normal limits. Lungs/Pleura: Mild left basilar atelectasis is seen. More marked consolidation is noted in the right lower lobe. No sizable effusion is seen. No parenchymal nodules are noted. Upper Abdomen: Visualized upper abdomen shows no acute abnormality. Musculoskeletal: Bony structures show postsurgical change in the left shoulder. No acute rib abnormality is noted. Review of the MIP images confirms the above findings. IMPRESSION: No evidence of pulmonary embolism. Bibasilar changes worst on the right with more marked consolidation identified. Aortic Atherosclerosis (ICD10-I70.0). Electronically Signed   By: Oneil Devonshire M.D.   On: 08/09/2024 02:05   DG Hand 2 View Left Result Date: 08/08/2024 EXAM: 1 or 2 VIEW(S) XRAY OF THE LEFT HAND 08/08/2024 06:37:11 PM COMPARISON: None available. CLINICAL HISTORY: swelling. FINDINGS: BONES AND JOINTS: Scattered degenerative arthritis greatest at the [facility]st [facility] joint. No acute fracture. No focal osseous lesion. No joint dislocation. SOFT TISSUES: Mild swelling about the hand and wrist. IMPRESSION: 1. No acute osseous abnormality. 2. Mild swelling about the hand and wrist. Electronically signed by: Norman Gatlin MD 08/08/2024 06:41 PM EST RP Workstation: [id number]   DG Chest Port 1 View if patient is in a treatment room. Result Date: 08/08/2024 EXAM: 1 VIEW(S) XRAY OF THE CHEST 08/08/2024 08:56:00 AM COMPARISON: 05/17/2020 status post left shoulder arthroplasty. CLINICAL HISTORY: Suspected Sepsis. FINDINGS: LUNGS AND PLEURA: Elevated right hemidiaphragm. Minimal  right basilar opacity is noted concerning for possible pneumonia or atelectasis. No pulmonary edema. No pleural effusion. No pneumothorax. HEART AND MEDIASTINUM: No acute abnormality of the cardiac and mediastinal silhouettes. BONES AND SOFT TISSUES: Status post left shoulder arthroplasty. No acute osseous abnormality. IMPRESSION: 1. Minimal right basilar opacity, compatible with atelectasis or pneumonia. Electronically signed by: Lynwood Seip MD 08/08/2024 09:12 AM EST RP Workstation: [id number]   DG Lumbar Spine 2-3 Views Result Date: 08/06/2024 CLINICAL DATA:  L4-5 fusion EXAM: LUMBAR SPINE - 2-3 VIEW COMPARISON:  MRI 05/15/2024 FINDINGS: The lowest lumbar type non-rib-bearing vertebra is labeled as L5. Tissue spreaders in place posteriorly at L4-5 with an instrument projecting over the L4-5 intervertebral disc space. IMPRESSION: 1. Instrument projects over the L4-5 intervertebral disc space. Electronically Signed   By: Ryan Salvage M.D.   On: 08/06/2024 19:16   DG Lumbar Spine 1 View Result Date: 08/06/2024 CLINICAL  DATA:  Intraoperative images for localization EXAM: LUMBAR SPINE - 1 VIEW COMPARISON:  05/15/2024 MRI FINDINGS: The lowest lumbar type non-rib-bearing vertebra is labeled as L5. Tissue spreaders are in place with a blunt tip slightly curved probe projecting between the L4 and L5 spinous processes in oriented towards the back of the L5 vertebral body. IMPRESSION: 1. Intraoperative localization with probe tip oriented towards the back of the L5 vertebral body. Electronically Signed   By: Ryan Salvage M.D.   On: 08/06/2024 19:15   DG C-Arm 1-60 Min-No Report Result Date: 08/06/2024 Fluoroscopy was utilized by the requesting physician.  No radiographic interpretation.   DG C-Arm 1-60 Min-No Report Result Date: 08/06/2024 Fluoroscopy was utilized by the requesting physician.  No radiographic interpretation.      MDS (myelodysplastic syndrome), low grade (HCC)  # Low grade  MDS- with ringed sideroblasts; no blasts. POSITIVE  For SF3B1; R-IPSS-low risk. MARCH 2023-bone marrow biopsy no evidence of obvious progression of MDS or any acute process.  Currently on  Luspatercept .   # proceed with Luspatercept  # 36  today.  Continueh q 4 weeks given the stability/Hemoglobin- is reasonable;- stable.    # adrenal insuffiencey- [NOv 2025; [place] ]sent in refill for hydrocortisone -patient will continue follow-up with PCP/endocrinology.  # Leukocytosis-predominant neutrophilia immature cells noted- peripheral blood flow cytometry APRIL 2025- No significant immunophenotypic abnormality detected Neutrophilia with left-shifted maturation and monocytosis- stable. [? Related recent steroid I]-improving monitor for now.  # vit D def- FEB 2024-Vitamin D - 25/low-  on Vit D 50-continue vitamin D  weekly. OCT 2024-  Vit D-34-recommend every other week. 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  ? Etiology- poor tolerance to cymablta. S/p evaluation with Dr.Vaslow.  currently waiting on gabapentin  [Dr.Patel]- defer to neurology.  stable   # CKD- stage III- 50s- monitor for now-stable  # Vaccination: s/p flu shot. Consider/recommend COVID/ RSV- stable  PS # DISPOSITION:  # today Luspatecept  # 3 weeks- labs- cbc/bmp; Luspatercept  # follow up in 6  weeks--MD-  abs CBC/cmp;  LDH; Luspatercept  SQ-Dr.B   All questions were answered. The patient knows to call the clinic with any problems, questions or concerns.   Cindy JONELLE Joe, MD 09/05/2024 9:18 AM

## 2024-09-05 NOTE — Addendum Note (Signed)
 Addended by: LAEL BROWNING A on: 09/05/2024 09:21 AM   Modules accepted: Orders

## 2024-09-05 NOTE — Progress Notes (Signed)
 No concerns today

## 2024-09-10 DIAGNOSIS — T380X5D Adverse effect of glucocorticoids and synthetic analogues, subsequent encounter: Secondary | ICD-10-CM | POA: Diagnosis not present

## 2024-09-10 DIAGNOSIS — M4726 Other spondylosis with radiculopathy, lumbar region: Secondary | ICD-10-CM | POA: Diagnosis not present

## 2024-09-10 DIAGNOSIS — D469 Myelodysplastic syndrome, unspecified: Secondary | ICD-10-CM | POA: Diagnosis not present

## 2024-09-10 DIAGNOSIS — M4316 Spondylolisthesis, lumbar region: Secondary | ICD-10-CM | POA: Diagnosis not present

## 2024-09-10 DIAGNOSIS — D630 Anemia in neoplastic disease: Secondary | ICD-10-CM | POA: Diagnosis not present

## 2024-09-10 DIAGNOSIS — D631 Anemia in chronic kidney disease: Secondary | ICD-10-CM | POA: Diagnosis not present

## 2024-09-10 DIAGNOSIS — N179 Acute kidney failure, unspecified: Secondary | ICD-10-CM | POA: Diagnosis not present

## 2024-09-10 DIAGNOSIS — E039 Hypothyroidism, unspecified: Secondary | ICD-10-CM | POA: Diagnosis not present

## 2024-09-10 DIAGNOSIS — N1831 Chronic kidney disease, stage 3a: Secondary | ICD-10-CM | POA: Diagnosis not present

## 2024-09-10 DIAGNOSIS — Z4789 Encounter for other orthopedic aftercare: Secondary | ICD-10-CM | POA: Diagnosis not present

## 2024-09-10 DIAGNOSIS — M5116 Intervertebral disc disorders with radiculopathy, lumbar region: Secondary | ICD-10-CM | POA: Diagnosis not present

## 2024-09-26 ENCOUNTER — Inpatient Hospital Stay

## 2024-09-26 ENCOUNTER — Ambulatory Visit
Admission: RE | Admit: 2024-09-26 | Discharge: 2024-09-26 | Disposition: A | Discharge status: Home / Self Care | Source: Ambulatory Visit | Attending: Nurse Practitioner | Admitting: Nurse Practitioner

## 2024-09-26 ENCOUNTER — Inpatient Hospital Stay (HOSPITAL_BASED_OUTPATIENT_CLINIC_OR_DEPARTMENT_OTHER): Admitting: Nurse Practitioner

## 2024-09-26 ENCOUNTER — Other Ambulatory Visit: Payer: Self-pay | Admitting: Nurse Practitioner

## 2024-09-26 VITALS — BP 117/80 | HR 92 | Resp 18 | Wt 159.4 lb

## 2024-09-26 DIAGNOSIS — M546 Pain in thoracic spine: Secondary | ICD-10-CM | POA: Diagnosis present

## 2024-09-26 DIAGNOSIS — D46Z Other myelodysplastic syndromes: Secondary | ICD-10-CM

## 2024-09-26 DIAGNOSIS — D461 Refractory anemia with ring sideroblasts: Secondary | ICD-10-CM | POA: Diagnosis not present

## 2024-09-26 LAB — CBC WITH DIFFERENTIAL/PLATELET
Abs Immature Granulocytes: 0.96 K/uL — ABNORMAL HIGH (ref 0.00–0.07)
Basophils Absolute: 0.1 K/uL (ref 0.0–0.1)
Basophils Relative: 1 %
Eosinophils Absolute: 0.1 K/uL (ref 0.0–0.5)
Eosinophils Relative: 1 %
HCT: 32.7 % — ABNORMAL LOW (ref 36.0–46.0)
Hemoglobin: 10.1 g/dL — ABNORMAL LOW (ref 12.0–15.0)
Immature Granulocytes: 6 %
Lymphocytes Relative: 10 %
Lymphs Abs: 1.7 K/uL (ref 0.7–4.0)
MCH: 30.5 pg (ref 26.0–34.0)
MCHC: 30.9 g/dL (ref 30.0–36.0)
MCV: 98.8 fL (ref 80.0–100.0)
Monocytes Absolute: 2.9 K/uL — ABNORMAL HIGH (ref 0.1–1.0)
Monocytes Relative: 17 %
Neutro Abs: 11.3 K/uL — ABNORMAL HIGH (ref 1.7–7.7)
Neutrophils Relative %: 65 %
Platelets: 256 K/uL (ref 150–400)
RBC: 3.31 MIL/uL — ABNORMAL LOW (ref 3.87–5.11)
RDW: 30.3 % — ABNORMAL HIGH (ref 11.5–15.5)
Smear Review: NORMAL
WBC: 17 K/uL — ABNORMAL HIGH (ref 4.0–10.5)
nRBC: 0.8 % — ABNORMAL HIGH (ref 0.0–0.2)

## 2024-09-26 LAB — BASIC METABOLIC PANEL - CANCER CENTER ONLY
Anion gap: 12 (ref 5–15)
BUN: 16 mg/dL (ref 8–23)
CO2: 24 mmol/L (ref 22–32)
Calcium: 9.4 mg/dL (ref 8.9–10.3)
Chloride: 101 mmol/L (ref 98–111)
Creatinine: 0.91 mg/dL (ref 0.44–1.00)
GFR, Estimated: >60 mL/min
Glucose, Bld: 128 mg/dL — ABNORMAL HIGH (ref 70–99)
Potassium: 4.2 mmol/L (ref 3.5–5.1)
Sodium: 137 mmol/L (ref 135–145)

## 2024-09-26 MED ORDER — OXYCODONE HCL 5 MG PO TABS: 5.0000 mg | ORAL_TABLET | Freq: Four times a day (QID) | ORAL | 0 refills | Status: AC | PRN

## 2024-09-26 MED ORDER — LUSPATERCEPT-AAMT 75 MG ~~LOC~~ SOLR
75.0000 mg | Freq: Once | SUBCUTANEOUS | Status: AC
  Administered 2024-09-26: 75 mg via SUBCUTANEOUS
  Filled 2024-09-26: qty 1.5

## 2024-09-26 NOTE — Progress Notes (Signed)
 Patient here for lab/Luspatercept , complaint of new mid back pain with history of lower back surgery.  Pain started yesterday evening with no known injury and has not contacted ortho due to late in the evening.  Confirmed with Dr. B to proceed with Luspatercept  and advised [facility].    Patient added to [facility] clinic today.  Morna advise patient go for xray today.  I transported Carla [name] over to medical mall while Carla [name] drove the car.  Carla [name] was registered and checked in for xray.  Waited for [name] to arrive to ensure he was reunited with patient.

## 2024-09-28 ENCOUNTER — Encounter: Payer: Self-pay | Admitting: Internal Medicine

## 2024-09-28 NOTE — Progress Notes (Signed)
 " [facility] CONSULT NOTE  Patient Care Team: [name], [name], MD as PCP - General Rennie, Cindy SAUNDERS, MD as Consulting Physician (Oncology) Pa, [facility] (Optometry)  CHIEF COMPLAINTS/PURPOSE OF CONSULTATION: [facility] for worsening back pain  Oncology History Overview Note   # July 2020-myelodysplastic syndrome with ringed sideroblasts-Normal karyotype; no blasts [IPSS R-Very low risk ~median survival 8.3 years]; iron studies B12 folic acid  myeloma panel normal; pyridoxine levels/copper /zinc -WNL. Erythropoietin  levels-60. II OPINION at [facility] [Dr.DeCastro]  # [facility]/ NGS: TET2([id number])c.2524delT(p.Ser842GlnfsTer31) Exon 3 frame-shift SF3B1([id number])c.2098A>G(p.Lys700Glu) Exon 15 missense  DNMT3A([id number])c.2204A>G(p.Tyr735Cys) Exon 19 missense   # JAN 11th 2020- Aranesp /retacrit;  # July 30th, 2021- START REVLIMID  10 mg/day  [SF3B70mutation]  MARCH 2023- BONE MARROW, ASPIRATE, CLOT, CORE:  -  Hypercellular marrow involved by myelodysplastic syndrome with ring  sideroblasts (MDS-RS)  -  Polytypic plasmacytosis   # [facility] 2023-increase the frequency of Aranesp  400 mcg every 2 weeks; continue Revlimid .   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  Ambulating in wheel chair  Carla [name] 74 y.o.  female history of low-grade MDS anemia  with ring sideroblasts currently on Luspatercept  is here for follow-up.      History of Present Illness   Carla [name] is a 74 year old female with adrenal insufficiency and MDS is here for luspatercept  injection and requested symptom management visit due to increased back pain.  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  Patient underwent bilateral L4-L5 laminotomy/foraminotomies/medial facetectomy to decompress the bilateral L4 and L5 nerve roots, posterior lumbar interbody fusion by Dr. Mavis in November.  Today patient presents in wheelchair wearing brace for back.  She reports standing in the kitchen last night when she suddenly heard a pop in her back followed by severe pain.  Today she reports that pain is up in middle back over thoracic spine.  She does have tenderness with palpation in that area today.  She denies any new or worsening incontinence, no new or worsening numbness and pain does not radiate to legs.  Will refill rx for pain medications that she has at home and order a x-ray of thoracic spine and lumbar spine.  I explained to the patient that if xray shows any new/acute findings she will need to go to ED for further evaluation.  She verbalizes understanding.    History of Present Illness         Review of Systems  Constitutional:  Positive for malaise/fatigue. Negative for chills, diaphoresis and fever.  HENT:  Negative for nosebleeds and sore throat.   Eyes:  Negative for double vision.  Respiratory:  Negative for cough, hemoptysis, sputum production, shortness of breath and wheezing.   Cardiovascular:  Negative for chest pain and orthopnea.  Gastrointestinal:  Negative for abdominal pain, blood in stool, constipation, diarrhea, heartburn, melena and  vomiting.  Genitourinary:  Negative for dysuria, frequency and urgency.  Musculoskeletal:  Positive for back pain and joint pain.  Skin: Negative.  Negative for itching and rash.  Neurological:  Negative  for dizziness, tingling, focal weakness, weakness and headaches.  Endo/Heme/Allergies:  Does not bruise/bleed easily.  Psychiatric/Behavioral:  Negative for depression. The patient is not nervous/anxious and does not have insomnia.     MEDICAL HISTORY:  Past Medical History:  Diagnosis Date   Allergic rhinitis, cause unspecified    Anemia, unspecified    Arthritis    Cancer (HCC)    myelodysplastic syndrome   Carpal tunnel syndrome    Cataract 2007/2010   Cystitis, unspecified    Family history of osteoporosis    Hypothyroidism    PONV (postoperative nausea and vomiting)    Postnasal drip    Syncope and collapse     SURGICAL HISTORY: Past Surgical History:  Procedure Laterality Date   BACK SURGERY     BONE MARROW BIOPSY     CARPAL TUNNEL RELEASE Bilateral    COLONOSCOPY  09/28/2003   COLONOSCOPY WITH PROPOFOL  N/A 07/31/2015   Procedure: COLONOSCOPY WITH PROPOFOL ;  Surgeon: Lamar Bunk, MD;  Location: [facility];  Service: Endoscopy;  Laterality: N/A;   DIAGNOSTIC LAPAROSCOPY     dx lack of pregnancy    EYE SURGERY     lasik, cataract, prk,yag procedure   REVERSE SHOULDER ARTHROPLASTY Left 03/21/2024   Procedure: ARTHROPLASTY, SHOULDER, TOTAL, REVERSE FOR FRACTURE;  Surgeon: Cristy Bonner DASEN, MD;  Location: [facility];  Service: Orthopedics;  Laterality: Left;   TONSILLECTOMY  1958    SOCIAL HISTORY: Social History   Socioeconomic History   Marital status: Married    Spouse name: Not on file   Number of children: Not on file   Years of education: Not on file   Highest education level: Not on file  Occupational History   Not on file  Tobacco Use   Smoking status: Never   Smokeless tobacco: Never  Vaping Use   Vaping status: Never Used  Substance and Sexual Activity   Alcohol use: Not Currently    Comment: Rare   Drug use: No   Sexual activity: Not Currently  Other Topics Concern   Not on file  Social History Narrative   No regular exercise      Drinks  lots of Pepsi         Social Drivers of Health   Tobacco Use: Low Risk (09/05/2024)   Patient History    Smoking Tobacco Use: Never    Smokeless Tobacco Use: Never    Passive Exposure: Not on file  Financial Resource Strain: Low Risk (05/30/2024)   Overall Financial Resource Strain (CARDIA)    Difficulty of Paying Living Expenses: Not hard at all  Food Insecurity: No Food Insecurity (08/08/2024)   Epic    Worried About Radiation Protection Practitioner of Food in the Last Year: Never true    Ran Out of Food in the Last Year: Never true  Transportation Needs: No Transportation Needs (08/08/2024)   Epic    Lack of Transportation (Medical): No    Lack of Transportation (Non-Medical): No  Physical Activity: Inactive (05/30/2024)   Exercise Vital Sign    Days of Exercise per Week: 0 days    Minutes of Exercise per Session: 0 min  Stress: No Stress Concern Present (05/30/2024)   [employer] of Occupational Health - Occupational Stress Questionnaire    Feeling of Stress: Not at all  Social Connections: Unknown (08/08/2024)   Social Connection and Isolation Panel    Frequency of Communication with Friends and Family: Patient unable to answer    Frequency of Social Gatherings with Friends and Family: Patient unable to answer    Attends Religious Services: Patient unable to answer    Active Member of Clubs or Organizations: Patient unable to answer    Attends Banker Meetings: Patient unable to answer    Marital Status: Married  Catering Manager Violence: Patient Unable To Answer (08/08/2024)   Epic    Fear of Current or Ex-Partner: Patient unable to answer    Emotionally Abused: Patient unable to answer    Physically Abused: Patient unable to answer    Sexually Abused: Patient unable to answer  Depression (PHQ2-9): Low Risk (09/05/2024)   Depression (PHQ2-9)    PHQ-2 Score: 0  Alcohol Screen: Low Risk (05/30/2024)   Alcohol Screen    Last Alcohol Screening Score (AUDIT): 0  Housing: Low  Risk (08/08/2024)   Epic    Unable to Pay for Housing in the Last Year: No    Number of Times Moved in the Last Year: 0    Homeless in the Last Year: No  Utilities: Not At Risk (08/08/2024)   Epic    Threatened with loss of utilities: No  Health Literacy: Adequate Health Literacy (05/30/2024)   B1300 Health Literacy    Frequency of need for help with medical instructions: Never    FAMILY HISTORY: Family History  Problem Relation Age of Onset   Osteoporosis Mother    Stroke Mother    Arthritis Mother    Coronary artery disease Father    Stroke Father 47   Arthritis Father    Heart disease Father    Diabetes Other        Aunts and uncles   Breast cancer Paternal Aunt    Breast cancer Maternal Aunt    Arthritis/Rheumatoid Child    Breast cancer Cousin    Cancer Maternal Aunt    Cancer Maternal Aunt    Cancer Paternal Aunt    Diabetes Paternal Aunt    Diabetes Sister    Diabetes Paternal Uncle    Heart disease Paternal Uncle    Stroke Paternal Uncle    Diabetes Paternal Aunt    Stroke Paternal Aunt     ALLERGIES:  is allergic to codeine.  MEDICATIONS:  Current Outpatient Medications  Medication Sig Dispense Refill   acetaminophen  (TYLENOL ) 500 MG tablet Take 2 tablets (1,000 mg total) by mouth 3 (three) times daily.     bisacodyl  (DULCOLAX) 5 MG EC tablet Take 1 tablet (5 mg total) by mouth daily as needed for moderate constipation.     calcium -vitamin D  (OSCAL WITH D) 500-5 MG-MCG tablet Take 1 tablet by mouth daily with breakfast.     cetirizine (ZYRTEC) 10 MG tablet Take 10 mg by mouth daily.     Darbepoetin Alfa  (ARANESP ) 500 MCG/ML SOSY injection Inject 500 mcg into the skin See admin instructions. Inject 500 mcg into the skin every week as needed     gabapentin  (NEURONTIN ) 300 MG capsule Take 300 mg by mouth 3 (three) times daily.     hydrocortisone  (CORTEF ) 10 MG tablet Take 1 pill in AM; and one pill around 2-3 pm. 180 tablet 1   levothyroxine  (SYNTHROID ) 25 MCG  tablet TAKE ONE TAB BY MOUTH ONCE DAILY. TAKE ON AN EMPTY STOMACH WITH A GLASS OF WATER ATLEAST 30-60 MINUTES BEFORE  BREAKFAST 90 tablet 3   loperamide  (IMODIUM ) 2 MG capsule Take 1 capsule (2 mg total) by mouth every 6 (six) hours as needed for diarrhea or loose stools.     luspatercept -aamt (REBLOZYL ) 75 MG subcutaneous injection Inject into the skin every 28 (twenty-eight) days.     methocarbamol  (ROBAXIN ) 500 MG tablet Take 1 tablet (500 mg total) by mouth 3 (three) times daily. (Patient taking differently: Take 500 mg by mouth every 8 (eight) hours as needed for muscle spasms.) 50 tablet 1   omeprazole  (PRILOSEC) 20 MG capsule Take 1 capsule (20 mg total) by mouth daily. 90 capsule 2   oxyCODONE  (OXY IR/ROXICODONE ) 5 MG immediate release tablet Take 1 tablet (5 mg total) by mouth every 6 (six) hours as needed for moderate pain (pain score 4-6). 20 tablet 0   polyethylene glycol (MIRALAX  / GLYCOLAX ) 17 g packet Take 17 g by mouth daily as needed for mild constipation.     promethazine  (PHENERGAN ) 25 MG tablet Take 0.5 tablets (12.5 mg total) by mouth every 6 (six) hours. 10 tablet 1   senna-docusate (SENOKOT-S) 8.6-50 MG tablet Take 1 tablet by mouth at bedtime.     No current facility-administered medications for this visit.   Facility-Administered Medications Ordered in Other Visits  Medication Dose Route Frequency Provider Last Rate Last Admin   heparin  lock flush 100 unit/mL  500 Units Intravenous Once Brahmanday, Govinda R, MD       sodium chloride  flush (NS) 0.9 % injection 10 mL  10 mL Intravenous PRN Brahmanday, Govinda R, MD          PHYSICAL EXAMINATION:   There were no vitals filed for this visit.         There were no vitals filed for this visit.          Physical Exam HENT:     Head: Normocephalic and atraumatic.     Mouth/Throat:     Pharynx: No oropharyngeal exudate.  Eyes:     Pupils: Pupils are equal, round, and reactive to light.  Cardiovascular:      Rate and Rhythm: Normal rate and regular rhythm.     Pulses: Normal pulses.     Heart sounds: Normal heart sounds.  Pulmonary:     Effort: Pulmonary effort is normal. No respiratory distress.     Breath sounds: Normal breath sounds. No wheezing.  Abdominal:     General: Bowel sounds are normal. There is no distension.     Palpations: Abdomen is soft. There is no mass.     Tenderness: There is no abdominal tenderness. There is no guarding or rebound.  Musculoskeletal:        General: No tenderness. Normal range of motion.     Cervical back: Normal range of motion and neck supple.  Skin:    General: Skin is warm.  Neurological:     Mental Status: She is alert and oriented to person, place, and time.  Psychiatric:        Mood and Affect: Affect normal.     LABORATORY DATA:  I have reviewed the data as listed Lab Results  Component Value Date   WBC 17.0 (H) 09/26/2024   HGB 10.1 (L) 09/26/2024   HCT 32.7 (L) 09/26/2024   MCV 98.8 09/26/2024   PLT 256 09/26/2024   Recent Labs    08/09/24 0541 08/10/24 0451 08/16/24 0951 09/05/24 0831 09/26/24 1106  NA 132*   < > 136 137 137  K  4.4   < > 3.9 4.2 4.2  CL 98   < > 106 101 101  CO2 23   < > 22 23 24   GLUCOSE 123*   < > 92 142* 128*  BUN 11   < > 20 15 16   CREATININE 0.75   < > 0.99 0.89 0.91  CALCIUM  8.7*   < > 8.6* 9.3 9.4  GFRNONAA >60   < > 60* >60 >60  PROT 6.8  --  7.9 8.2*  --   ALBUMIN 2.7*  --  3.5 4.1  --   AST 35  --  23 21  --   ALT 18  --  23 14  --   ALKPHOS 34*  --  53 81  --   BILITOT 0.7  --  0.7 0.6  --    < > = values in this interval not displayed.     DG Thoracic Spine 2 View Result Date: 09/26/2024 CLINICAL DATA:  Postoperative pain. EXAM: THORACIC SPINE 2 VIEWS COMPARISON:  August 09, 2024. FINDINGS: Stable superior endplate depression of T8 vertebral body consistent with old fracture. New mild compression deformity of T12 vertebral body concerning for acute to subacute fracture. No  spondylolisthesis is noted. IMPRESSION: Probable old T8 fracture. New mild T12 compression deformity concerning for acute to subacute fracture. Electronically Signed   By: Lynwood Landy Raddle M.D.   On: 09/26/2024 13:42   DG Lumbar Spine Complete Result Date: 09/26/2024 CLINICAL DATA:  Postoperative pain EXAM: LUMBAR SPINE - COMPLETE 4+ VIEW COMPARISON:  May 15, 2024 FINDINGS: Status post surgical posterior fusion of L4-5 with bilateral intrapedicular screw placement and interbody fusion. No acute fracture is noted. Stable superior endplate deformities of L2 and L3 vertebral bodies consistent with chronic fracture or degenerative change. IMPRESSION: Postsurgical and degenerative changes as described above. No acute abnormality seen. Electronically Signed   By: Lynwood Landy Raddle M.D.   On: 09/26/2024 13:39    Assessment and plan  Worsening back pain:  s/p bilateral L4-L5 laminotomy/foraminotomies/medial facetectomy to decompress the bilateral L4 and L5 nerve roots, posterior lumbar interbody fusion by Dr. Mavis in November.  Sudden pop with worsening pain that started last night while standing in kitchen.  Refill of oxy sent to pharmacy.  Ordered xray of lumbar spine and Thoracic spine which showed new mild T2 compression deformity concerning for acute fracture.  I called patient back and advised her the due to recent surgery, now with severe pain and this finding on x-ray I would advise her to go to ED for further evaluation.  She verbalizes understanding.  She will need to continue to follow up as previously scheduled.  Advised patient to also contact her Neurosurgeon Dr. Mavis as well.   All questions were answered. The patient knows to call the clinic with any problems, questions or concerns.   Morna Husband, NP 09/28/2024 3:13 PM   "

## 2024-10-17 ENCOUNTER — Inpatient Hospital Stay: Admitting: Internal Medicine

## 2024-10-17 ENCOUNTER — Inpatient Hospital Stay

## 2024-10-17 ENCOUNTER — Telehealth: Payer: Self-pay | Admitting: Internal Medicine

## 2024-10-17 ENCOUNTER — Inpatient Hospital Stay: Attending: Internal Medicine

## 2024-10-17 DIAGNOSIS — D461 Refractory anemia with ring sideroblasts: Secondary | ICD-10-CM | POA: Insufficient documentation

## 2024-10-17 NOTE — Telephone Encounter (Signed)
 Patient on the phone and said she is having a back spasm and one just hit as they were about to head to her appointments. She said she is now unable to move. She said sometimes it last an hour or 2. She is wondering if she can be worked in for her appts this afternoon possibly. She is in a lot of pain. I told her I would reach out to the team and see what they thought. Please Advise.

## 2024-10-18 ENCOUNTER — Other Ambulatory Visit: Payer: Self-pay | Admitting: Internal Medicine

## 2024-10-19 ENCOUNTER — Inpatient Hospital Stay: Attending: Internal Medicine

## 2024-10-19 ENCOUNTER — Other Ambulatory Visit: Payer: Self-pay | Admitting: Internal Medicine

## 2024-10-19 ENCOUNTER — Inpatient Hospital Stay

## 2024-10-19 ENCOUNTER — Encounter: Payer: Self-pay | Admitting: Internal Medicine

## 2024-10-19 ENCOUNTER — Other Ambulatory Visit: Payer: Self-pay | Admitting: Neurosurgery

## 2024-10-19 ENCOUNTER — Inpatient Hospital Stay: Admitting: Internal Medicine

## 2024-10-19 DIAGNOSIS — D46Z Other myelodysplastic syndromes: Secondary | ICD-10-CM

## 2024-10-19 DIAGNOSIS — M48062 Spinal stenosis, lumbar region with neurogenic claudication: Secondary | ICD-10-CM

## 2024-10-19 DIAGNOSIS — D461 Refractory anemia with ring sideroblasts: Secondary | ICD-10-CM | POA: Diagnosis present

## 2024-10-19 LAB — CBC WITH DIFFERENTIAL/PLATELET
Abs Immature Granulocytes: 1.01 K/uL — ABNORMAL HIGH (ref 0.00–0.07)
Basophils Absolute: 0.1 K/uL (ref 0.0–0.1)
Basophils Relative: 1 %
Eosinophils Absolute: 0.1 K/uL (ref 0.0–0.5)
Eosinophils Relative: 1 %
HCT: 35 % — ABNORMAL LOW (ref 36.0–46.0)
Hemoglobin: 11 g/dL — ABNORMAL LOW (ref 12.0–15.0)
Immature Granulocytes: 6 %
Lymphocytes Relative: 16 %
Lymphs Abs: 2.9 K/uL (ref 0.7–4.0)
MCH: 30.5 pg (ref 26.0–34.0)
MCHC: 31.4 g/dL (ref 30.0–36.0)
MCV: 97 fL (ref 80.0–100.0)
Monocytes Absolute: 1.7 K/uL — ABNORMAL HIGH (ref 0.1–1.0)
Monocytes Relative: 10 %
Neutro Abs: 11.6 K/uL — ABNORMAL HIGH (ref 1.7–7.7)
Neutrophils Relative %: 66 %
Platelets: 249 K/uL (ref 150–400)
RBC: 3.61 MIL/uL — ABNORMAL LOW (ref 3.87–5.11)
RDW: 29.3 % — ABNORMAL HIGH (ref 11.5–15.5)
WBC: 17.4 K/uL — ABNORMAL HIGH (ref 4.0–10.5)
nRBC: 1.1 % — ABNORMAL HIGH (ref 0.0–0.2)

## 2024-10-19 LAB — CMP (CANCER CENTER ONLY)
ALT: 17 U/L (ref 0–44)
AST: 24 U/L (ref 15–41)
Albumin: 4.5 g/dL (ref 3.5–5.0)
Alkaline Phosphatase: 92 U/L (ref 38–126)
Anion gap: 13 (ref 5–15)
BUN: 16 mg/dL (ref 8–23)
CO2: 25 mmol/L (ref 22–32)
Calcium: 9.8 mg/dL (ref 8.9–10.3)
Chloride: 101 mmol/L (ref 98–111)
Creatinine: 0.93 mg/dL (ref 0.44–1.00)
GFR, Estimated: >60 mL/min
Glucose, Bld: 97 mg/dL (ref 70–99)
Potassium: 4.3 mmol/L (ref 3.5–5.1)
Sodium: 139 mmol/L (ref 135–145)
Total Bilirubin: 0.6 mg/dL (ref 0.0–1.2)
Total Protein: 9 g/dL — ABNORMAL HIGH (ref 6.5–8.1)

## 2024-10-19 LAB — LACTATE DEHYDROGENASE: LDH: 262 U/L — ABNORMAL HIGH (ref 105–235)

## 2024-10-19 MED ORDER — LUSPATERCEPT-AAMT 75 MG ~~LOC~~ SOLR
1.0000 mg/kg | Freq: Once | SUBCUTANEOUS | Status: AC
  Administered 2024-10-19: 70 mg via SUBCUTANEOUS
  Filled 2024-10-19: qty 1.4

## 2024-10-19 NOTE — Progress Notes (Signed)
 " [facility] CONSULT NOTE  Patient Care Team: [name], [name], MD as PCP - General Rennie, Cindy SAUNDERS, MD as Consulting Physician (Oncology) Pa, [facility] (Optometry)  CHIEF COMPLAINTS/PURPOSE OF CONSULTATION: MDS   Oncology History Overview Note   # July 2020-myelodysplastic syndrome with ringed sideroblasts-Normal karyotype; no blasts [IPSS R-Very low risk ~median survival 8.3 years]; iron studies B12 folic acid  myeloma panel normal; pyridoxine levels/copper /zinc -WNL. Erythropoietin  levels-60. II OPINION at [facility] [Dr.DeCastro]  # [facility]/ NGS: TET2([id number])c.2524delT(p.Ser842GlnfsTer31) Exon 3 frame-shift SF3B1([id number])c.2098A>G(p.Lys700Glu) Exon 15 missense  DNMT3A([id number])c.2204A>G(p.Tyr735Cys) Exon 19 missense   # JAN 11th 2020- Aranesp /retacrit;  # July 30th, 2021- START REVLIMID  10 mg/day  [SF3B28mutation]  MARCH 2023- BONE MARROW, ASPIRATE, CLOT, CORE:  -  Hypercellular marrow involved by myelodysplastic syndrome with ring  sideroblasts (MDS-RS)  -  Polytypic plasmacytosis   # [facility] 2023-increase the frequency of Aranesp  400 mcg every 2 weeks; continue Revlimid .   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  Ambulating in wheel chair  Carla [name] 74 y.o.  female history of low-grade MDS anemia with ring sideroblasts  currently on Luspatercept  is here for follow-up.      History of Present Illness   Carla [name] is a 74 year old female with adrenal insufficiency and MDS is here for a follow up.  Discussed the use of AI scribe software for clinical note transcription with the patient, who gave verbal consent to proceed.  History of Present Illness   Carla [name] is a 74 year old female with low-grade myelodysplastic syndrome who presents for evaluation of severe, recurrent back spasms.  She has low-grade myelodysplastic syndrome managed with supportive care, including regular injections every four weeks. She denies fevers, chills, sweats, abnormal bleeding, or bruising.  Over the past ten days, she has experienced severe, diffuse back spasms involving the entire back, described as intense pain with muscle knots. These episodes are precipitated by movement, including minor bumps or transfers, and result in inability to ambulate, requiring wheelchair use. When spasms subside, she is able to walk with a walker and perform limited activities. There is no radiation of pain to her legs. She denies nasal congestion and attributes tearing to crying from pain.  She has been evaluated by her back surgeon, who identified a small, likely chronic compression fracture in the lower back on x-ray. MRI has been ordered, but she is uncertain if she can tolerate the procedure due to pain severity. Multiple second opinions have recommended continued conservative management. Her pain regimen includes acetaminophen , oxycodone  with acetaminophen , methocarbamol , gabapentin , and occasional cyclobenzaprine . She typically takes oxycodone  with acetaminophen  every six hours, with occasional extra doses during severe episodes, but does not exceed four doses daily. Severe spasms are managed with medications and heating pad, which provides partial relief after 45-60 minutes.  She expresses significant distress regarding the impact  of spasms on her  mobility and daily function, stating she needs additional support to manage pain and improve quality of life.     Review of Systems  Constitutional:  Positive for malaise/fatigue. Negative for chills, diaphoresis and fever.  HENT:  Negative for nosebleeds and sore throat.   Eyes:  Negative for double vision.  Respiratory:  Negative for cough, hemoptysis, sputum production, shortness of breath and wheezing.   Cardiovascular:  Negative for chest pain and orthopnea.  Gastrointestinal:  Negative for abdominal pain, blood in stool, constipation, diarrhea, heartburn, melena and vomiting.  Genitourinary:  Negative for dysuria, frequency and urgency.  Musculoskeletal:  Positive for back pain and joint pain.  Skin: Negative.  Negative for itching and rash.  Neurological:  Negative for dizziness, tingling, focal weakness, weakness and headaches.  Endo/Heme/Allergies:  Does not bruise/bleed easily.  Psychiatric/Behavioral:  Negative for depression. The patient is not nervous/anxious and does not have insomnia.     MEDICAL HISTORY:  Past Medical History:  Diagnosis Date   Allergic rhinitis, cause unspecified    Anemia, unspecified    Arthritis    Cancer (HCC)    myelodysplastic syndrome   Carpal tunnel syndrome    Cataract 2007/2010   Cystitis, unspecified    Family history of osteoporosis    Hypothyroidism    PONV (postoperative nausea and vomiting)    Postnasal drip    Syncope and collapse     SURGICAL HISTORY: Past Surgical History:  Procedure Laterality Date   BACK SURGERY     BONE MARROW BIOPSY     CARPAL TUNNEL RELEASE Bilateral    COLONOSCOPY  09/28/2003   COLONOSCOPY WITH PROPOFOL  N/A 07/31/2015   Procedure: COLONOSCOPY WITH PROPOFOL ;  Surgeon: Lamar Bunk, MD;  Location: [facility];  Service: Endoscopy;  Laterality: N/A;   DIAGNOSTIC LAPAROSCOPY     dx lack of pregnancy    EYE SURGERY     lasik, cataract, prk,yag procedure   REVERSE SHOULDER ARTHROPLASTY Left  03/21/2024   Procedure: ARTHROPLASTY, SHOULDER, TOTAL, REVERSE FOR FRACTURE;  Surgeon: Cristy Bonner DASEN, MD;  Location: [facility];  Service: Orthopedics;  Laterality: Left;   TONSILLECTOMY  1958    SOCIAL HISTORY: Social History   Socioeconomic History   Marital status: Married    Spouse name: Not on file   Number of children: Not on file   Years of education: Not on file   Highest education level: Not on file  Occupational History   Not on file  Tobacco Use   Smoking status: Never   Smokeless tobacco: Never  Vaping Use   Vaping status: Never Used  Substance and Sexual Activity   Alcohol use: Not Currently    Comment: Rare   Drug use: No   Sexual activity: Not Currently  Other Topics Concern   Not on file  Social History Narrative   No regular exercise      Drinks lots of [employer]         Social Drivers of Health   Tobacco Use: Low Risk (10/19/2024)   Patient History    Smoking Tobacco Use: Never    Smokeless Tobacco Use: Never    Passive Exposure: Not on file  Financial Resource Strain: Low Risk (05/30/2024)   Overall Financial Resource Strain (CARDIA)    Difficulty of Paying Living Expenses: Not hard at all  Food Insecurity: No Food Insecurity (08/08/2024)   Epic    Worried About Radiation Protection Practitioner of Food in the Last Year: Never true  Ran Out of Food in the Last Year: Never true  Transportation Needs: No Transportation Needs (08/08/2024)   Epic    Lack of Transportation (Medical): No    Lack of Transportation (Non-Medical): No  Physical Activity: Inactive (05/30/2024)   Exercise Vital Sign    Days of Exercise per Week: 0 days    Minutes of Exercise per Session: 0 min  Stress: No Stress Concern Present (05/30/2024)   [employer] of Occupational Health - Occupational Stress Questionnaire    Feeling of Stress: Not at all  Social Connections: Unknown (08/08/2024)   Social Connection and Isolation Panel    Frequency of Communication with Friends and Family: Patient unable  to answer    Frequency of Social Gatherings with Friends and Family: Patient unable to answer    Attends Religious Services: Patient unable to answer    Active Member of Clubs or Organizations: Patient unable to answer    Attends Banker Meetings: Patient unable to answer    Marital Status: Married  Catering Manager Violence: Patient Unable To Answer (08/08/2024)   Epic    Fear of Current or Ex-Partner: Patient unable to answer    Emotionally Abused: Patient unable to answer    Physically Abused: Patient unable to answer    Sexually Abused: Patient unable to answer  Depression (PHQ2-9): Low Risk (10/19/2024)   Depression (PHQ2-9)    PHQ-2 Score: 0  Alcohol Screen: Low Risk (05/30/2024)   Alcohol Screen    Last Alcohol Screening Score (AUDIT): 0  Housing: Low Risk (08/08/2024)   Epic    Unable to Pay for Housing in the Last Year: No    Number of Times Moved in the Last Year: 0    Homeless in the Last Year: No  Utilities: Not At Risk (08/08/2024)   Epic    Threatened with loss of utilities: No  Health Literacy: Adequate Health Literacy (05/30/2024)   B1300 Health Literacy    Frequency of need for help with medical instructions: Never    FAMILY HISTORY: Family History  Problem Relation Age of Onset   Osteoporosis Mother    Stroke Mother    Arthritis Mother    Coronary artery disease Father    Stroke Father 17   Arthritis Father    Heart disease Father    Diabetes Other        Aunts and uncles   Breast cancer Paternal Aunt    Breast cancer Maternal Aunt    Arthritis/Rheumatoid Child    Breast cancer Cousin    Cancer Maternal Aunt    Cancer Maternal Aunt    Cancer Paternal Aunt    Diabetes Paternal Aunt    Diabetes Sister    Diabetes Paternal Uncle    Heart disease Paternal Uncle    Stroke Paternal Uncle    Diabetes Paternal Aunt    Stroke Paternal Aunt     ALLERGIES:  is allergic to codeine.  MEDICATIONS:  Current Outpatient Medications  Medication  Sig Dispense Refill   acetaminophen  (TYLENOL ) 500 MG tablet Take 2 tablets (1,000 mg total) by mouth 3 (three) times daily.     bisacodyl  (DULCOLAX) 5 MG EC tablet Take 1 tablet (5 mg total) by mouth daily as needed for moderate constipation.     calcium -vitamin D  (OSCAL WITH D) 500-5 MG-MCG tablet Take 1 tablet by mouth daily with breakfast.     cetirizine (ZYRTEC) 10 MG tablet Take 10 mg by mouth daily.     Darbepoetin  Alfa (ARANESP ) 500 MCG/ML SOSY injection Inject 500 mcg into the skin See admin instructions. Inject 500 mcg into the skin every week as needed     gabapentin  (NEURONTIN ) 300 MG capsule Take 300 mg by mouth 3 (three) times daily.     hydrocortisone  (CORTEF ) 10 MG tablet Take 1 pill in AM; and one pill around 2-3 pm. 180 tablet 1   levothyroxine  (SYNTHROID ) 25 MCG tablet TAKE ONE TAB BY MOUTH ONCE DAILY. TAKE ON AN EMPTY STOMACH WITH A GLASS OF WATER ATLEAST 30-60 MINUTES BEFORE BREAKFAST 90 tablet 3   loperamide  (IMODIUM ) 2 MG capsule Take 1 capsule (2 mg total) by mouth every 6 (six) hours as needed for diarrhea or loose stools.     luspatercept -aamt (REBLOZYL ) 75 MG subcutaneous injection Inject into the skin every 28 (twenty-eight) days.     methocarbamol  (ROBAXIN ) 500 MG tablet Take 1 tablet (500 mg total) by mouth 3 (three) times daily. 50 tablet 1   omeprazole  (PRILOSEC) 20 MG capsule Take 1 capsule (20 mg total) by mouth daily. 90 capsule 2   oxyCODONE  (OXY IR/ROXICODONE ) 5 MG immediate release tablet Take 1 tablet (5 mg total) by mouth every 6 (six) hours as needed for moderate pain (pain score 4-6). 20 tablet 0   polyethylene glycol (MIRALAX  / GLYCOLAX ) 17 g packet Take 17 g by mouth daily as needed for mild constipation.     promethazine  (PHENERGAN ) 25 MG tablet Take 0.5 tablets (12.5 mg total) by mouth every 6 (six) hours. 10 tablet 1   senna-docusate (SENOKOT-S) 8.6-50 MG tablet Take 1 tablet by mouth at bedtime.     No current facility-administered medications for this  visit.   Facility-Administered Medications Ordered in Other Visits  Medication Dose Route Frequency Provider Last Rate Last Admin   heparin  lock flush 100 unit/mL  500 Units Intravenous Once Zanovia Rotz R, MD       luspatercept -aamt (REBLOZYL ) subcutaneous injection 70 mg  1 mg/kg (Treatment Plan Recorded) Subcutaneous Once Carlotta Telfair R, MD       sodium chloride  flush (NS) 0.9 % injection 10 mL  10 mL Intravenous PRN Rosebud Koenen R, MD          PHYSICAL EXAMINATION:   Vitals:   10/19/24 0830  BP: 131/80  Pulse: 83  Resp: 16  Temp: 97.9 F (36.6 C)  SpO2: 95%          Filed Weights           Physical Exam HENT:     Head: Normocephalic and atraumatic.     Mouth/Throat:     Pharynx: No oropharyngeal exudate.  Eyes:     Pupils: Pupils are equal, round, and reactive to light.  Cardiovascular:     Rate and Rhythm: Normal rate and regular rhythm.     Pulses: Normal pulses.     Heart sounds: Normal heart sounds.  Pulmonary:     Effort: Pulmonary effort is normal. No respiratory distress.     Breath sounds: Normal breath sounds. No wheezing.  Abdominal:     General: Bowel sounds are normal. There is no distension.     Palpations: Abdomen is soft. There is no mass.     Tenderness: There is no abdominal tenderness. There is no guarding or rebound.  Musculoskeletal:        General: No tenderness. Normal range of motion.     Cervical back: Normal range of motion and neck supple.  Skin:    General:  Skin is warm.  Neurological:     Mental Status: She is alert and oriented to person, place, and time.  Psychiatric:        Mood and Affect: Affect normal.     LABORATORY DATA:  I have reviewed the data as listed Lab Results  Component Value Date   WBC 17.4 (H) 10/19/2024   HGB 11.0 (L) 10/19/2024   HCT 35.0 (L) 10/19/2024   MCV 97.0 10/19/2024   PLT 249 10/19/2024   Recent Labs    08/16/24 0951 09/05/24 0831 09/26/24 1106  10/19/24 0826  NA 136 137 137 139  K 3.9 4.2 4.2 4.3  CL 106 101 101 101  CO2 22 23 24 25   GLUCOSE 92 142* 128* 97  BUN 20 15 16 16   CREATININE 0.99 0.89 0.91 0.93  CALCIUM  8.6* 9.3 9.4 9.8  GFRNONAA 60* >60 >60 >60  PROT 7.9 8.2*  --  9.0*  ALBUMIN 3.5 4.1  --  4.5  AST 23 21  --  24  ALT 23 14  --  17  ALKPHOS 53 81  --  92  BILITOT 0.7 0.6  --  0.6     DG Thoracic Spine 2 View Result Date: 09/26/2024 CLINICAL DATA:  Postoperative pain. EXAM: THORACIC SPINE 2 VIEWS COMPARISON:  August 09, 2024. FINDINGS: Stable superior endplate depression of T8 vertebral body consistent with old fracture. New mild compression deformity of T12 vertebral body concerning for acute to subacute fracture. No spondylolisthesis is noted. IMPRESSION: Probable old T8 fracture. New mild T12 compression deformity concerning for acute to subacute fracture. Electronically Signed   By: Lynwood Landy Raddle M.D.   On: 09/26/2024 13:42   DG Lumbar Spine Complete Result Date: 09/26/2024 CLINICAL DATA:  Postoperative pain EXAM: LUMBAR SPINE - COMPLETE 4+ VIEW COMPARISON:  May 15, 2024 FINDINGS: Status post surgical posterior fusion of L4-5 with bilateral intrapedicular screw placement and interbody fusion. No acute fracture is noted. Stable superior endplate deformities of L2 and L3 vertebral bodies consistent with chronic fracture or degenerative change. IMPRESSION: Postsurgical and degenerative changes as described above. No acute abnormality seen. Electronically Signed   By: Lynwood Landy Raddle M.D.   On: 09/26/2024 13:39      MDS (myelodysplastic syndrome), low grade (HCC)  # Low grade MDS- with ringed sideroblasts; no blasts. POSITIVE  For SF3B1; R-IPSS-low risk. MARCH 2023-bone marrow biopsy no evidence of obvious progression of MDS or any acute process.  Currently on  Luspatercept .   # proceed with Luspatercept  # 38  today.  Continueh q 4 weeks given the stability/Hemoglobin- is reasonable;- stable.    #  adrenal insuffiencey- [NOv 2025; [place] ]sent in refill for hydrocortisone -patient will continue follow-up with PCP/endocrinology.  # Leukocytosis-predominant neutrophilia immature cells noted- peripheral blood flow cytometry APRIL 2025- No significant immunophenotypic abnormality detected Neutrophilia with left-shifted maturation and monocytosis-    stable.    # back pain- spasms on percocet q 4-6 hours; awaiting on MRI- [per surgeon, Dr.Jenkins-dec, 2025- Probable old T8 fracture. New mild T12 compression deformity concerning for acute to subacute fracture.] recommend extra oxycodone  5-10 mg q 6 hours- trial of flexeril  instead of of methacarobamol  # vit D def- FEB 2024-Vitamin D - 25/low-  on Vit D 50-continue vitamin D  weekly. OCT 2024-  Vit D-34-recommend every other week. 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  ? Etiology- poor tolerance to cymablta. S/p evaluation with Dr.Vaslow.  currently waiting on gabapentin  [Dr.Patel]- defer to neurology.  stable   # CKD- stage III- 50s- monitor for now-stable  # Vaccination: s/p flu shot. Consider/recommend COVID/ RSV- stable  PS # DISPOSITION:  # today Luspatecept  # 4  weeks- labs- cbc/bmp; Luspatercept  SQ--Dr.B   All questions were answered. The patient knows to call the clinic with any problems, questions or concerns.   Cindy JONELLE Joe, MD 10/19/2024 9:32 AM   "

## 2024-10-19 NOTE — Progress Notes (Signed)
 C/o back spasms x1-2 weeks, worse, pain is 10/10 at times. She is asking if there is another muscle relaxer or pain rx that might help better? She has flexeril  at home. Unable to stand for weight and afraid to move.

## 2024-10-19 NOTE — Assessment & Plan Note (Addendum)
" 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 MARCH 2023-bone marrow biopsy no evidence of obvious progression of MDS or any acute process.  Currently on  Luspatercept .   # proceed with Luspatercept  # 38  today.  Continueh q 4 weeks given the stability/Hemoglobin- is reasonable;- stable.    # adrenal insuffiencey- [NOv 2025; [place] ]sent in refill for hydrocortisone -patient will continue follow-up with PCP/endocrinology.  # Leukocytosis-predominant neutrophilia immature cells noted- peripheral blood flow cytometry APRIL 2025- No significant immunophenotypic abnormality detected Neutrophilia with left-shifted maturation and monocytosis-    stable.    # back pain- spasms on percocet q 4-6 hours; awaiting on MRI- [per surgeon, Dr.Jenkins-dec, 2025- Probable old T8 fracture. New mild T12 compression deformity concerning for acute to subacute fracture.] recommend extra oxycodone  5-10 mg q 6 hours- trial of flexeril  instead of of methacarobamol  # vit D def- FEB 2024-Vitamin D - 25/low-  on Vit D 50-continue vitamin D  weekly. OCT 2024-  Vit D-34-recommend every other week. 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  ? Etiology- poor tolerance to cymablta. S/p evaluation with Dr.Vaslow.  currently waiting on gabapentin  [Dr.Patel]- defer to neurology.  stable   # CKD- stage III- 50s- monitor for now-stable  # Vaccination: s/p flu shot. Consider/recommend COVID/ RSV- stable  PS # DISPOSITION:  # today Luspatecept  # 4  weeks- labs- cbc/bmp; Luspatercept  SQ--Dr.B "

## 2024-10-31 ENCOUNTER — Ambulatory Visit

## 2024-11-16 ENCOUNTER — Inpatient Hospital Stay

## 2024-11-16 ENCOUNTER — Inpatient Hospital Stay: Admitting: Internal Medicine

## 2024-11-21 ENCOUNTER — Inpatient Hospital Stay

## 2024-11-21 ENCOUNTER — Inpatient Hospital Stay: Admitting: Internal Medicine

## 2024-11-30 ENCOUNTER — Ambulatory Visit

## 2025-02-06 ENCOUNTER — Ambulatory Visit: Admitting: "Endocrinology

## 2025-05-31 ENCOUNTER — Ambulatory Visit
